# Patient Record
Sex: Female | Born: 1945 | Race: White | Hispanic: No | Marital: Married | Smoking: Never smoker
Health system: Southern US, Community
[De-identification: ages and names within clinical notes are randomized; demographics above are authoritative.]

## PROBLEM LIST (undated history)

## (undated) DIAGNOSIS — R531 Weakness: Secondary | ICD-10-CM

## (undated) DIAGNOSIS — F32A Depression, unspecified: Secondary | ICD-10-CM

## (undated) DIAGNOSIS — F329 Major depressive disorder, single episode, unspecified: Secondary | ICD-10-CM

## (undated) DIAGNOSIS — Z9842 Cataract extraction status, left eye: Secondary | ICD-10-CM

## (undated) DIAGNOSIS — M51369 Other intervertebral disc degeneration, lumbar region without mention of lumbar back pain or lower extremity pain: Secondary | ICD-10-CM

## (undated) DIAGNOSIS — G4700 Insomnia, unspecified: Secondary | ICD-10-CM

## (undated) DIAGNOSIS — Z950 Presence of cardiac pacemaker: Secondary | ICD-10-CM

## (undated) DIAGNOSIS — E119 Type 2 diabetes mellitus without complications: Secondary | ICD-10-CM

## (undated) DIAGNOSIS — N183 Chronic kidney disease, stage 3 unspecified: Secondary | ICD-10-CM

## (undated) DIAGNOSIS — I890 Lymphedema, not elsewhere classified: Secondary | ICD-10-CM

## (undated) DIAGNOSIS — I472 Ventricular tachycardia: Secondary | ICD-10-CM

## (undated) DIAGNOSIS — Z860100 Personal history of colon polyps, unspecified: Secondary | ICD-10-CM

## (undated) DIAGNOSIS — G2581 Restless legs syndrome: Secondary | ICD-10-CM

## (undated) DIAGNOSIS — I739 Peripheral vascular disease, unspecified: Secondary | ICD-10-CM

## (undated) DIAGNOSIS — E039 Hypothyroidism, unspecified: Secondary | ICD-10-CM

## (undated) DIAGNOSIS — I209 Angina pectoris, unspecified: Secondary | ICD-10-CM

## (undated) DIAGNOSIS — R609 Edema, unspecified: Secondary | ICD-10-CM

## (undated) DIAGNOSIS — Z87898 Personal history of other specified conditions: Secondary | ICD-10-CM

## (undated) DIAGNOSIS — G4733 Obstructive sleep apnea (adult) (pediatric): Secondary | ICD-10-CM

## (undated) DIAGNOSIS — R32 Unspecified urinary incontinence: Secondary | ICD-10-CM

## (undated) DIAGNOSIS — I471 Supraventricular tachycardia: Secondary | ICD-10-CM

## (undated) DIAGNOSIS — M1990 Unspecified osteoarthritis, unspecified site: Secondary | ICD-10-CM

## (undated) DIAGNOSIS — K649 Unspecified hemorrhoids: Secondary | ICD-10-CM

## (undated) DIAGNOSIS — E041 Nontoxic single thyroid nodule: Secondary | ICD-10-CM

## (undated) DIAGNOSIS — K219 Gastro-esophageal reflux disease without esophagitis: Secondary | ICD-10-CM

## (undated) DIAGNOSIS — K5790 Diverticulosis of intestine, part unspecified, without perforation or abscess without bleeding: Secondary | ICD-10-CM

## (undated) DIAGNOSIS — I10 Essential (primary) hypertension: Secondary | ICD-10-CM

## (undated) DIAGNOSIS — M2540 Effusion, unspecified joint: Secondary | ICD-10-CM

## (undated) DIAGNOSIS — R55 Syncope and collapse: Secondary | ICD-10-CM

## (undated) DIAGNOSIS — D649 Anemia, unspecified: Secondary | ICD-10-CM

## (undated) DIAGNOSIS — Z8719 Personal history of other diseases of the digestive system: Secondary | ICD-10-CM

## (undated) DIAGNOSIS — R001 Bradycardia, unspecified: Secondary | ICD-10-CM

## (undated) DIAGNOSIS — I4719 Other supraventricular tachycardia: Secondary | ICD-10-CM

## (undated) DIAGNOSIS — L719 Rosacea, unspecified: Secondary | ICD-10-CM

## (undated) DIAGNOSIS — Z7982 Long term (current) use of aspirin: Secondary | ICD-10-CM

## (undated) DIAGNOSIS — F419 Anxiety disorder, unspecified: Secondary | ICD-10-CM

## (undated) DIAGNOSIS — N189 Chronic kidney disease, unspecified: Secondary | ICD-10-CM

## (undated) DIAGNOSIS — Z8739 Personal history of other diseases of the musculoskeletal system and connective tissue: Secondary | ICD-10-CM

## (undated) DIAGNOSIS — I5189 Other ill-defined heart diseases: Secondary | ICD-10-CM

## (undated) DIAGNOSIS — J986 Disorders of diaphragm: Secondary | ICD-10-CM

## (undated) DIAGNOSIS — M62838 Other muscle spasm: Secondary | ICD-10-CM

## (undated) DIAGNOSIS — E785 Hyperlipidemia, unspecified: Secondary | ICD-10-CM

## (undated) DIAGNOSIS — I5032 Chronic diastolic (congestive) heart failure: Secondary | ICD-10-CM

## (undated) DIAGNOSIS — L309 Dermatitis, unspecified: Secondary | ICD-10-CM

## (undated) DIAGNOSIS — I700 Atherosclerosis of aorta: Secondary | ICD-10-CM

## (undated) DIAGNOSIS — M2550 Pain in unspecified joint: Secondary | ICD-10-CM

## (undated) DIAGNOSIS — I498 Other specified cardiac arrhythmias: Secondary | ICD-10-CM

## (undated) DIAGNOSIS — Z9841 Cataract extraction status, right eye: Secondary | ICD-10-CM

## (undated) DIAGNOSIS — I2510 Atherosclerotic heart disease of native coronary artery without angina pectoris: Secondary | ICD-10-CM

## (undated) DIAGNOSIS — M549 Dorsalgia, unspecified: Secondary | ICD-10-CM

## (undated) DIAGNOSIS — G4730 Sleep apnea, unspecified: Secondary | ICD-10-CM

## (undated) DIAGNOSIS — G8929 Other chronic pain: Secondary | ICD-10-CM

## (undated) DIAGNOSIS — R51 Headache: Secondary | ICD-10-CM

## (undated) DIAGNOSIS — I5030 Unspecified diastolic (congestive) heart failure: Secondary | ICD-10-CM

## (undated) DIAGNOSIS — I8390 Asymptomatic varicose veins of unspecified lower extremity: Secondary | ICD-10-CM

## (undated) DIAGNOSIS — Z789 Other specified health status: Secondary | ICD-10-CM

## (undated) DIAGNOSIS — K5900 Constipation, unspecified: Secondary | ICD-10-CM

## (undated) DIAGNOSIS — Z9989 Dependence on other enabling machines and devices: Secondary | ICD-10-CM

## (undated) DIAGNOSIS — A498 Other bacterial infections of unspecified site: Secondary | ICD-10-CM

## (undated) DIAGNOSIS — R3915 Urgency of urination: Secondary | ICD-10-CM

## (undated) DIAGNOSIS — I4729 Other ventricular tachycardia: Secondary | ICD-10-CM

## (undated) DIAGNOSIS — I509 Heart failure, unspecified: Secondary | ICD-10-CM

## (undated) HISTORY — PX: TRIGGER FINGER RELEASE: SHX641

## (undated) HISTORY — PX: DILATION AND CURETTAGE OF UTERUS: SHX78

## (undated) HISTORY — PX: BACK SURGERY: SHX140

## (undated) HISTORY — DX: Sleep apnea, unspecified: G47.30

## (undated) HISTORY — DX: Essential (primary) hypertension: I10

## (undated) HISTORY — DX: Dependence on other enabling machines and devices: Z99.89

## (undated) HISTORY — DX: Depression, unspecified: F32.A

## (undated) HISTORY — PX: CATARACT EXTRACTION W/ INTRAOCULAR LENS IMPLANT: SHX1309

## (undated) HISTORY — PX: PICC LINE PLACE PERIPHERAL (ARMC HX): HXRAD1248

## (undated) HISTORY — DX: Major depressive disorder, single episode, unspecified: F32.9

## (undated) HISTORY — DX: Other ventricular tachycardia: I47.29

## (undated) HISTORY — PX: CARPAL TUNNEL RELEASE: SHX101

## (undated) HISTORY — PX: TONSILLECTOMY: SUR1361

## (undated) HISTORY — PX: ROTATOR CUFF REPAIR: SHX139

## (undated) HISTORY — PX: CHOLECYSTECTOMY: SHX55

## (undated) HISTORY — PX: IMPLANTABLE CONTACT LENS IMPLANTATION: SHX1792

## (undated) HISTORY — DX: Dermatitis, unspecified: L30.9

## (undated) HISTORY — DX: Ventricular tachycardia: I47.2

## (undated) HISTORY — DX: Heart failure, unspecified: I50.9

## (undated) HISTORY — PX: COLONOSCOPY: SHX174

## (undated) HISTORY — PX: DIAGNOSTIC LAPAROSCOPY: SUR761

## (undated) HISTORY — PX: TOTAL ABDOMINAL HYSTERECTOMY W/ BILATERAL SALPINGOOPHORECTOMY: SHX83

## (undated) HISTORY — DX: Hyperlipidemia, unspecified: E78.5

## (undated) HISTORY — DX: Obstructive sleep apnea (adult) (pediatric): G47.33

## (undated) HISTORY — DX: Gastro-esophageal reflux disease without esophagitis: K21.9

## (undated) HISTORY — PX: ABDOMINAL HYSTERECTOMY: SHX81

## (undated) HISTORY — PX: CATARACT EXTRACTION, BILATERAL: SHX1313

---

## 2003-11-13 ENCOUNTER — Encounter: Payer: Self-pay | Admitting: Internal Medicine

## 2004-01-18 ENCOUNTER — Ambulatory Visit: Payer: Self-pay | Admitting: General Surgery

## 2004-01-27 ENCOUNTER — Ambulatory Visit: Payer: Self-pay | Admitting: General Surgery

## 2004-02-23 ENCOUNTER — Ambulatory Visit: Payer: Self-pay | Admitting: Internal Medicine

## 2004-07-27 ENCOUNTER — Ambulatory Visit: Payer: Self-pay | Admitting: Internal Medicine

## 2004-11-29 ENCOUNTER — Ambulatory Visit: Payer: Self-pay | Admitting: Internal Medicine

## 2005-01-25 ENCOUNTER — Ambulatory Visit: Payer: Self-pay | Admitting: Orthopaedic Surgery

## 2005-02-15 ENCOUNTER — Observation Stay: Payer: Self-pay | Admitting: Orthopaedic Surgery

## 2005-02-19 ENCOUNTER — Inpatient Hospital Stay: Payer: Self-pay | Admitting: Internal Medicine

## 2005-02-19 ENCOUNTER — Other Ambulatory Visit: Payer: Self-pay

## 2005-03-15 ENCOUNTER — Ambulatory Visit: Payer: Self-pay | Admitting: Internal Medicine

## 2005-06-29 ENCOUNTER — Ambulatory Visit: Payer: Self-pay | Admitting: Internal Medicine

## 2005-08-07 ENCOUNTER — Ambulatory Visit: Payer: Self-pay

## 2006-01-28 ENCOUNTER — Ambulatory Visit: Payer: Self-pay | Admitting: General Surgery

## 2006-02-01 ENCOUNTER — Ambulatory Visit: Payer: Self-pay | Admitting: General Surgery

## 2006-08-09 ENCOUNTER — Ambulatory Visit: Payer: Self-pay | Admitting: Internal Medicine

## 2007-05-20 ENCOUNTER — Ambulatory Visit: Payer: Self-pay | Admitting: Internal Medicine

## 2007-06-02 ENCOUNTER — Ambulatory Visit: Payer: Self-pay | Admitting: Internal Medicine

## 2007-06-03 ENCOUNTER — Ambulatory Visit: Payer: Self-pay | Admitting: Internal Medicine

## 2007-06-03 ENCOUNTER — Encounter: Payer: Self-pay | Admitting: Internal Medicine

## 2007-06-05 ENCOUNTER — Ambulatory Visit: Payer: Self-pay | Admitting: Internal Medicine

## 2007-06-11 ENCOUNTER — Ambulatory Visit: Payer: Self-pay | Admitting: Internal Medicine

## 2007-06-11 HISTORY — PX: LEFT HEART CATH AND CORONARY ANGIOGRAPHY: CATH118249

## 2007-06-17 ENCOUNTER — Ambulatory Visit: Payer: Self-pay | Admitting: Internal Medicine

## 2007-07-29 ENCOUNTER — Ambulatory Visit: Payer: Self-pay | Admitting: Internal Medicine

## 2007-10-09 ENCOUNTER — Ambulatory Visit: Payer: Self-pay | Admitting: Rheumatology

## 2008-02-10 ENCOUNTER — Ambulatory Visit: Payer: Self-pay | Admitting: Internal Medicine

## 2008-02-18 ENCOUNTER — Ambulatory Visit: Payer: Self-pay | Admitting: Internal Medicine

## 2008-02-18 ENCOUNTER — Observation Stay: Payer: Self-pay | Admitting: Internal Medicine

## 2008-06-08 ENCOUNTER — Encounter: Payer: Self-pay | Admitting: Internal Medicine

## 2008-06-08 ENCOUNTER — Ambulatory Visit: Payer: Self-pay | Admitting: Internal Medicine

## 2008-06-08 DIAGNOSIS — R0609 Other forms of dyspnea: Secondary | ICD-10-CM | POA: Insufficient documentation

## 2008-06-08 DIAGNOSIS — E781 Pure hyperglyceridemia: Secondary | ICD-10-CM | POA: Insufficient documentation

## 2008-06-08 DIAGNOSIS — R0600 Dyspnea, unspecified: Secondary | ICD-10-CM | POA: Insufficient documentation

## 2008-06-10 ENCOUNTER — Ambulatory Visit: Payer: Self-pay | Admitting: Cardiology

## 2008-06-10 ENCOUNTER — Encounter: Payer: Self-pay | Admitting: Internal Medicine

## 2008-06-14 LAB — CONVERTED CEMR LAB
Albumin: 4.2 g/dL (ref 3.5–5.2)
Alkaline Phosphatase: 85 units/L (ref 39–117)
BUN: 22 mg/dL (ref 6–23)
Cholesterol: 156 mg/dL (ref 0–200)
Glucose, Bld: 118 mg/dL — ABNORMAL HIGH (ref 70–99)
HDL: 41 mg/dL (ref >39)
LDL Cholesterol: 63 mg/dL (ref 0–99)
Potassium: 4.4 meq/L (ref 3.5–5.3)
Triglycerides: 258 mg/dL — ABNORMAL HIGH (ref <150)

## 2008-08-02 ENCOUNTER — Ambulatory Visit: Payer: Self-pay | Admitting: Internal Medicine

## 2008-11-03 ENCOUNTER — Ambulatory Visit: Payer: Self-pay | Admitting: Orthopedic Surgery

## 2008-11-11 ENCOUNTER — Ambulatory Visit: Payer: Self-pay | Admitting: Orthopedic Surgery

## 2009-08-03 ENCOUNTER — Ambulatory Visit: Payer: Self-pay | Admitting: Internal Medicine

## 2009-10-14 ENCOUNTER — Ambulatory Visit: Payer: Self-pay | Admitting: Internal Medicine

## 2010-02-12 DIAGNOSIS — E039 Hypothyroidism, unspecified: Secondary | ICD-10-CM | POA: Insufficient documentation

## 2010-05-05 ENCOUNTER — Ambulatory Visit (INDEPENDENT_AMBULATORY_CARE_PROVIDER_SITE_OTHER): Payer: No Typology Code available for payment source | Admitting: Cardiovascular Disease

## 2010-05-05 ENCOUNTER — Encounter: Payer: Self-pay | Admitting: Cardiovascular Disease

## 2010-05-05 DIAGNOSIS — R609 Edema, unspecified: Secondary | ICD-10-CM

## 2010-05-05 DIAGNOSIS — E781 Pure hyperglyceridemia: Secondary | ICD-10-CM

## 2010-05-05 DIAGNOSIS — R000 Tachycardia, unspecified: Secondary | ICD-10-CM

## 2010-05-05 DIAGNOSIS — E119 Type 2 diabetes mellitus without complications: Secondary | ICD-10-CM

## 2010-05-05 DIAGNOSIS — G4733 Obstructive sleep apnea (adult) (pediatric): Secondary | ICD-10-CM | POA: Insufficient documentation

## 2010-05-05 DIAGNOSIS — R0602 Shortness of breath: Secondary | ICD-10-CM

## 2010-05-05 DIAGNOSIS — I10 Essential (primary) hypertension: Secondary | ICD-10-CM

## 2010-05-05 DIAGNOSIS — Z9989 Dependence on other enabling machines and devices: Secondary | ICD-10-CM

## 2010-05-05 MED ORDER — METOPROLOL TARTRATE 25 MG PO TABS: 25.0000 mg | ORAL_TABLET | Freq: Two times a day (BID) | ORAL | Status: DC

## 2010-05-05 MED ORDER — ROSUVASTATIN CALCIUM 5 MG PO TABS: 5.0000 mg | ORAL_TABLET | Freq: Every day | ORAL | Status: DC

## 2010-05-05 NOTE — Assessment & Plan Note (Signed)
Her weight is likely the main cause of her sleep apnea. She does wear CPAP on a regular basis.

## 2010-05-05 NOTE — Assessment & Plan Note (Signed)
For her elevated triglycerides, we will start fish oil 2 tabs a day titrating upwards as tolerated. She is also very concerned about her cholesterol and we will try Crestor 5 mg daily. She did take Pravachol 40 mg previously though did have muscle ache.

## 2010-05-05 NOTE — Assessment & Plan Note (Signed)
Shortness of breath and atypical type chest pain is likely secondary from her underlying tachycardia, also as well as her deconditioning

## 2010-05-05 NOTE — Assessment & Plan Note (Signed)
She does have tachycardia on today's visit which she reports is chronic. 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 We have started metoprolol tartrate 25 mg b.i.d. We will titrate the beta blocker upwards as tolerated for rate control.

## 2010-05-05 NOTE — Assessment & Plan Note (Signed)
I have encouraged her to continue to watch her diet, try to lose weight, increase her exercise.

## 2010-05-05 NOTE — Assessment & Plan Note (Signed)
Blood pressure is elevated today. We'll add metoprolol tartrate 25 mg b.i.d. This will also assist with her tachycardia.

## 2010-05-05 NOTE — Progress Notes (Signed)
   Patient ID: [name], female    DOB: Jun 17, 1945, 65 y.o.   MRN: [id number]  HPI Comments:  65 year old woman with a strong family history of  heart disease, Obesity, diabetes, obstructive sleep apnea on CPAP, chronic tachycardia, hyperlipidemia with very elevated triglycerides, chronic edema who presents for routine followup.  She reports that she has had fatigue, palpitations, elevated blood pressure. She is concerned about her high cholesterol and triglycerides given her strong family history. She also reports a baseline elevated heart rate. She has not been exercising. She has been trying to watch her diet though her weight continues to be a problem. She is uncertain why she has fatigue and occasional chest discomfort. The chest discomfort is rare, sometimes comes on at rest, sometimes with exertion it does not last very long. The diuretic pill has been helping her edema but she continues to have swelling. She does not take lisinopril as she reports it drains her and makes her feel dizzy. If she does take it, she takes a half pill.   Her identical sister has a bicuspid aortic vavle and CAD.  However, she has a normal aortic valve and minimal nonobstuctive CAD by cath 5/09.    Was admitted in January 2010 with dyspnea and presyncope. CT and echo normal.   Labs from August 2011 showed total cholesterol 190, triglycerides 540, HDL 34, hemoglobin A1c 6.0, vitamin D is very low at less than 30     Review of Systems  Constitutional: Positive for fatigue. Negative for chills, diaphoresis, appetite change and unexpected weight change.  HENT: Negative.   Eyes: Negative.   Respiratory: Positive for apnea, chest tightness and shortness of breath. Negative for cough and choking.   Cardiovascular: Positive for chest pain, palpitations and leg swelling.  Gastrointestinal: Negative.   Musculoskeletal: Negative.   Skin: Negative.   Neurological: Negative.   Hematological: Negative.     Psychiatric/Behavioral: Negative.    BP 160/78  Pulse 103  Ht 5\' 3"  (1.6 m)  Wt 211 lb (95.709 kg)  BMI 37.38 kg/m2   Physical Exam  Constitutional: She is oriented to person, place, and time. She appears well-developed and well-nourished.  HENT:  Head: Normocephalic.  Nose: Nose normal.  Mouth/Throat: Oropharynx is clear and moist.  Eyes: Conjunctivae are normal. Pupils are equal, round, and reactive to light.  Neck: Normal range of motion. Neck supple. No JVD present.  Cardiovascular: Regular rhythm, S1 normal, S2 normal and intact distal pulses.   Occasional extrasystoles are present. Tachycardia present.  Exam reveals no gallop and no friction rub.   Murmur heard.  Crescendo systolic murmur is present with a grade of 2/6       RSB  Pulmonary/Chest: Effort normal and breath sounds normal. No respiratory distress. She has no wheezes. She has no rales. She exhibits no tenderness.  Abdominal: Soft. Bowel sounds are normal. She exhibits no distension. There is no tenderness.       Obese  Musculoskeletal: Normal range of motion. She exhibits edema. She exhibits no tenderness.  Lymphadenopathy:    She has no cervical adenopathy.  Neurological: She is alert and oriented to person, place, and time. Coordination normal.  Skin: Skin is warm and dry. No rash noted. No erythema.  Psychiatric: She has a normal mood and affect. Her behavior is normal. Judgment and thought content normal.      Assessment and Plan

## 2010-05-05 NOTE — Patient Instructions (Signed)
Start metoprolol 1/2 tab in AM and PM. Monitor heart rate for 1 to 2 weeks. Take a full pill if the heart rate continues to be more than 90 on a regular basis.  Start crestor 5 mg daily. Check cholesterol when you meed with Dr. Hyacinth Meeker in June.  Start fish oil 2 tabs daily, increase up to 4 a day as tolerated.  Follow up in the cardiology clinic in 4 months or earlier if you continue to have tachycardia and high blood pressure.

## 2010-05-05 NOTE — Assessment & Plan Note (Signed)
She has mild edema today. I suggested she could take an extra torsemide pill as needed. Edema is likely secondary to her weight, underlying obstructive sleep apnea, possible diastolic dysfunction.

## 2010-05-25 ENCOUNTER — Encounter: Payer: Self-pay | Admitting: Cardiovascular Disease

## 2010-05-26 ENCOUNTER — Encounter: Payer: Self-pay | Admitting: Cardiovascular Disease

## 2010-06-15 ENCOUNTER — Ambulatory Visit: Payer: Self-pay | Admitting: General Surgery

## 2010-06-23 ENCOUNTER — Ambulatory Visit: Payer: Self-pay | Admitting: General Surgery

## 2010-06-27 NOTE — Assessment & Plan Note (Signed)
[facility] OFFICE NOTE   NAME:POWELLPhoenix, [name]                         MRN:          [id number]  DATE:05/20/2007                            DOB:          1945-04-09    REFERRING PHYSICIAN:  Bethann Punches   REASON FOR CONSULTATION:  Chest pain and palpitations.   HISTORY OF PRESENT ILLNESS:  [name] is a very pleasant, 65 year old  woman who is an identical twin sister of [name].  We follow her  sister in our clinic for history of bicuspid aortic valve and  significant coronary artery disease.  She is status post valve  replacement and bypass surgery.   [name] has a history of chest pain dating back to 2005.  At that  time, she underwent cardiac catheterization by Dr. Gwen Pounds which showed  just minimal nonobstructive coronary artery disease.  She also had a  normal echocardiogram.  Since that time, she has had occasional chest  pain about once a month.  Here until recently, about a month ago, she  just started to develop much more frequent chest pain.  She describes  this as a burning.  It is much different than her previous chest pain  and now happens almost on a daily basis.  It can come at rest or with  exertion and usually lasts just less than a minute and then resolves.  There are no associated symptoms.  She has also been noticing that she  has had periods of palpitations and at times when her blood pressure is  up, she says her heart rate gets up to 100-110, she has PVCs and feels  uncomfortable.  She has not had syncope or any presyncope.   REVIEW OF SYSTEMS:  She denies any heart failure symptoms.  She does  have a history of borderline diabetes as well as gastroesophageal reflux  disease and fatigued.  The remainder of the review of systems is  negative for HPI and problem list.   PROBLEM LIST:  1. Chest pain.  Cardiac catheterization in 2005, showed minimal      coronary artery  disease.  2. Hypertension.  3. Obesity.  4. Multinodular goiter.  5. Metabolic syndrome.  6. A very strong family history of coronary artery disease.  7. History of gallstones, status post cholecystectomy.  8. Status post hysterectomy.   CURRENT MEDICATIONS:  1. Premarin 0.625 mg a day.  2. Pravachol 40 a day.  3. Zetia 10 a day.  4. Aspirin 81 a day.  5. Nexium 40 a day.  6. Diclofenac 75 a day.  7. Lisinopril/HCTZ 20/12.5 1 tablet daily.  8. Requip.   ALLERGIES:  PENICILLIN AND CODEINE.   SOCIAL HISTORY:  She is married with two kids.  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  She does not smoke or  drink alcohol.   FAMILY HISTORY:  Mother died at 80 due to congestive heart failure.  Father died at 45 due to heart disease and renal failure.  She has an  identical twin sister who  has aortic stenosis secondary to bicuspid  aortic valve and severe coronary disease.  She had a brother, age 22,  who also has coronary artery disease.   PHYSICAL EXAMINATION:  GENERAL:  She is in no acute distress.  She  ambulates around the clinic without any respiratory difficulty.  VITAL SIGNS:  Blood pressure is 154/84, heart rate 89, weight 197.  HEENT:  Normal.  Neck is supple and thick.  No obvious JVD.  Carotids  are 2+ bilaterally with no bruits.  There is no lymphadenopathy.  She  does have significant thyromegaly.  CARDIAC:  PMI is nondisplaced.  Regular rate and rhythm.  No murmurs,  rubs or gallops.  LUNGS:  Clear.  ABDOMEN:  Obese, nontender, nondistended.  No obvious  hepatosplenomegaly.  No bruits, no masses.  Good bowel sounds.  EXTREMITIES:  Warm with no cyanosis, clubbing or edema.  No rash.  NEUROLOGIC:  Alert and oriented x3.  Cranial nerves II-XII are intact.  Moves all four extremities without difficulty.  Affect is very pleasant.   EKG shows normal sinus rhythm at a rate of 89.  There is normal axis and  intervals.   ASSESSMENT/PLAN:  1. Chest  pain.  This has both typical and atypical features.  We have      discussed the options of stress testing versus catheterization.      Given her family history, she feels that she would not be reassured      with a negative stress test and thus, we have decided to proceed      with cardiac catheterization.  She would like to schedule this for      after she gets back from the beach.  2. Palpitations.  They sound to be like they may have an anxiety      component, but she denies this.  She has been under a lot of stress      recently as her mother died in 12-May-2022.  We will go ahead and put a 2-      week event monitor on her to see if we can detect any arrhythmias.  3. Hypertension.  Blood pressure is suboptimally controlled.  Start      Coreg 6.25 b.i.d.  This may also help with her palpitations.  4. Hyperlipidemia.  This is followed by Dr. Hyacinth Meeker.   DISPOSITION:  Pending the results of her catheterization and monitor.     Bevelyn Buckles. Bensimhon, MD  Electronically Signed    DRB/MedQ  DD: 05/20/2007  DT: 05/20/2007  Job #: [id number]   cc:   Bethann Punches

## 2010-06-27 NOTE — Assessment & Plan Note (Signed)
[facility] OFFICE NOTE   NAME:POWELLOmolara, Carol                         MRN:          [id number]  DATE:06/17/2007                            DOB:          [birth year]    PRIMARY CARE PHYSICIAN:  Dr. Bethann Punches.   INTERVAL HISTORY:  Ms. Street is a delightful 65 year old woman with a  strong family history of coronary artery disease and bicuspid aortic  valve in her identical twin sister.  However, her aortic valve has been  normal.  She does have a history of hypertension, hyperlipidemia,  metabolic syndrome, and obesity.  She recently underwent catheterization  last week for intermittent chest pain.  This showed just minimal  nonobstructive coronary disease with 30% RCA lesion and 30% lesion in  first diagonal.  LV function was normal.   She returns for post cath follow-up.  She continues to have mild chest  twinges without change.  Otherwise, she has done well.  She has not had  any problems with her catheterization site.   CURRENT MEDICATIONS:  1. Premarin 0.625 mg a day.  2. Pravachol 40 a day.  3. Zetia 10 a day.  4. Aspirin 81 a day.  5. Nexium 40 a day.  6. Diclofenac 75 a day.  7. Lisinopril 20/12.5 1/2 tablet a day.  8. Requip one a day.  9. Carvedilol 6.25 b.i.d.   PHYSICAL EXAMINATION:  GENERAL:  She is in no acute distress.  Ambulates  around the clinic without any respiratory difficulty.  VITAL SIGNS:  Blood pressure 126/78, heart rate 72, weight 200.  HEENT:  Normal.  NECK:  Supple.  There is no JVD, carotids 2+ bilaterally without any  bruits.  There is no lymphadenopathy or thyromegaly.  CARDIAC:  PMI is nondisplaced.  She has a regular rate and rhythm with  S4, no murmurs.  LUNGS:  Clear.  ABDOMEN:  Obese, nontender, nondistended.  Good bowel sounds.  EXTREMITIES:  Warm with no cyanosis, clubbing or edema.  Good pulses.  Groin site looks good.  There is no bruit or hematoma.  NEUROLOGICAL:  Alert and oriented x3.  Cranial nerves II-XII are intact.  Moves all four extremities without  difficulty.   ASSESSMENT/PLAN:  1. Minimal amount of obstructive coronary artery disease.  She does      have some evidence of plaquing in her coronaries which was really      not evident in 2005.  Thus I think will need to have aggressive      risk factor modification.  I have told her we need to get tight      control of her blood pressure, get her LDL under 70, and make sure      she loses weight with an exercise program.  She will follow up with      this with Dr. Hyacinth Meeker.  I have given her goal of losing 1/2 pound a      week over the next three months.  2. Blood pressure is fairly well-controlled.  We did tell her just to  go up to increase her lisinopril to a full tablet.   DISPOSITION:  Will see her back in 3-4 months for routine follow-up.     Bevelyn Buckles. Bensimhon, MD  Electronically Signed    DRB/MedQ  DD: 06/17/2007  DT: 06/17/2007  Job #: [id number]   cc:   Bethann Punches

## 2010-07-04 ENCOUNTER — Ambulatory Visit: Payer: Self-pay | Admitting: Internal Medicine

## 2010-08-17 ENCOUNTER — Ambulatory Visit: Payer: Self-pay | Admitting: Internal Medicine

## 2010-08-23 ENCOUNTER — Ambulatory Visit: Payer: Self-pay | Admitting: Internal Medicine

## 2010-11-13 ENCOUNTER — Telehealth: Payer: Self-pay | Admitting: Cardiovascular Disease

## 2010-11-13 ENCOUNTER — Inpatient Hospital Stay: Payer: Self-pay | Admitting: Internal Medicine

## 2010-11-13 DIAGNOSIS — R079 Chest pain, unspecified: Secondary | ICD-10-CM

## 2010-11-13 NOTE — Telephone Encounter (Signed)
C/o chest pressure mid-sternal to left side with left arm pain that has incr gradually over past 2 weeks. Left arm pain is her most significant complaint. "Usually relieved by 1 NTG SL." Pain has mostly felt like pressure, however, Friday night "felt grabbing, sharp pain that brought tears to my eyes." This occurred twice and resolved on its own. One night in past 2 weeks pt woke up with SOB.  Symptoms occur at rest to minimal activity. BP has been consistently elevated at 159/96, HR >100 usually sometimes 115-120.  Yesterday one episode of low BP 94/50, diaphoresis after just working in [employer]. This was not after taking BP med. (Pt does have h/o tachycardia and when seen last by DB atypical CP thought to be due to tachy.) Pt does have h/o CAD, cath <5 yrs ago at [facility], no stent. She assures me that her CP is nothing like when seen previously for tachycardia, "this is different."   Pt is asymptomatic at this time, only c/o is continued elevated BP. I advised she be seen today, nowhere on our schedule, I recc she either go to urgent care or (ER if symptoms occur again). Pt wants to see PCP Bethann Punches, they can do EKG. Told pt ok but to call back with update, and advised if at any time symptoms return or feels unstable to call 911 or have someone take her to ER. Pt agrees. Also, pt needs f/u with TG in the near future, pt will call back to schedule.

## 2010-11-13 NOTE — Telephone Encounter (Signed)
Pt called c/o chest discomfort over the weekend. BP up. Some nausea and BP drop down, sweating yesterday. Arm pain took a nitro with some relief.

## 2010-11-17 ENCOUNTER — Encounter: Payer: Self-pay | Admitting: Internal Medicine

## 2010-11-29 ENCOUNTER — Encounter: Payer: Self-pay | Admitting: Cardiovascular Disease

## 2010-12-01 ENCOUNTER — Encounter: Payer: Self-pay | Admitting: Cardiovascular Disease

## 2010-12-01 ENCOUNTER — Ambulatory Visit (INDEPENDENT_AMBULATORY_CARE_PROVIDER_SITE_OTHER): Payer: No Typology Code available for payment source | Admitting: Cardiovascular Disease

## 2010-12-01 DIAGNOSIS — E781 Pure hyperglyceridemia: Secondary | ICD-10-CM

## 2010-12-01 DIAGNOSIS — R000 Tachycardia, unspecified: Secondary | ICD-10-CM

## 2010-12-01 DIAGNOSIS — I10 Essential (primary) hypertension: Secondary | ICD-10-CM

## 2010-12-01 DIAGNOSIS — R609 Edema, unspecified: Secondary | ICD-10-CM

## 2010-12-01 DIAGNOSIS — E119 Type 2 diabetes mellitus without complications: Secondary | ICD-10-CM

## 2010-12-01 DIAGNOSIS — R0602 Shortness of breath: Secondary | ICD-10-CM

## 2010-12-01 MED ORDER — OMEGA-3-ACID ETHYL ESTERS 1 G PO CAPS: 2.0000 g | ORAL_CAPSULE | Freq: Two times a day (BID) | ORAL | Status: DC

## 2010-12-01 NOTE — Assessment & Plan Note (Signed)
We'll start bystolic for tachycardia. Her heart rate today is adequate though she does report faster heart rate in the late afternoon as the morning metoprolol is wearing off.

## 2010-12-01 NOTE — Assessment & Plan Note (Signed)
She is currently taking torsemide and has no significant edema. We've asked her to be careful and take this as needed to avoid dehydration

## 2010-12-01 NOTE — Progress Notes (Signed)
Patient ID: [name], female    DOB: [birth year], 65 y.o.   MRN: [id number]  HPI Comments:  65 year old woman with a strong family history of  heart disease, Obesity, diabetes, obstructive sleep apnea on CPAP, chronic tachycardia, hyperlipidemia with very elevated triglycerides, chronic edema who presents for routine followup.  She was recently seen in the hospital for severe hypertension and chest pain. Admission on October 1, discharge on November 14 2010. She ruled out by cardiac enzymes, chest CT was negative for PE. She had a stress test that showed no ischemia. Her medications were adjusted. 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 She did have a cortisone shot by Dr. Bethann Punches with some improvement of her symptoms.  She does not tolerate statins and is currently not taking any medication for cholesterol. She is off lisinopril as per her report, her blood pressure was too low so she does report it goes upwards in the afternoon. Metoprolol does seem to help her heart rate and she would like a pill that does both blood pressure and heart rate. She feels that she has to take the metoprolol in the p.m. Early as it is wearing off.   Recent lab work shows her hemoglobin A1c has gone up from many low 6 range up to 7.3. Cholesterol 225, triglycerides 630, HDL 36 She reports that these labs were not fasting  normal aortic valve and minimal nonobstuctive CAD by cath 5/09.   Was admitted in January 2010 with dyspnea and presyncope. CT and echo normal.      Outpatient Encounter Prescriptions as of 12/01/2010  Medication Sig Dispense Refill  . aspirin 81 MG tablet Take 81 mg by mouth daily.        [name] esomeprazole (NEXIUM) 40 MG capsule Take 40 mg by mouth daily before breakfast.        . estrogens, conjugated, (PREMARIN) 0.625 MG tablet Take  0.625 mg by mouth daily. Take daily for 21 days then do not take for 7 days.       . fish oil-omega-3 fatty acids 1000 MG capsule Take 2 g by mouth daily.        [name] gabapentin (NEURONTIN) 300 MG capsule Take 300 mg by mouth 3 (three) times daily.        [name] glipiZIDE (GLUCOTROL) 5 MG 24 hr tablet Take 5 mg by mouth daily.        [name] omega-3 acid ethyl esters (LOVAZA) 1 G capsule Take 2 g by mouth 2 (two) times daily.        . Potassium 95 MG TBCR Take 95 mg by mouth 1 dose over 46 hours.        . torsemide (DEMADEX) 20 MG tablet Take 20 mg by mouth daily.        .  metoprolol tartrate (LOPRESSOR) 25 MG tablet Take 1 tablet (25 mg total) by mouth 2 (two) times daily.  60 tablet  11     Review of Systems  HENT: Negative.   Eyes: Negative.   Respiratory: Negative.   Cardiovascular: Negative.   Gastrointestinal: Negative.   Musculoskeletal: Negative.   Skin: Negative.   Neurological: Negative.   Hematological: Negative.   Psychiatric/Behavioral: Negative.   All other systems reviewed and are negative.   BP 140/76  Pulse 80  Ht 5\' 3"  (  1.6 m)  Wt 209 lb (94.802 kg)  BMI 37.02 kg/m2   Physical Exam  Constitutional: She is oriented to person, place, and time. She appears well-developed and well-nourished.  HENT:  Head: Normocephalic.  Nose: Nose normal.  Mouth/Throat: Oropharynx is clear and moist.  Eyes: Conjunctivae are normal. Pupils are equal, round, and reactive to light.  Neck: Normal range of motion. Neck supple. No JVD present.  Cardiovascular: Regular rhythm, S1 normal, S2 normal and intact distal pulses.   No extrasystoles are present. Exam reveals no gallop and no friction rub.   Murmur heard.  Crescendo systolic murmur is present with a grade of 2/6  Pulmonary/Chest: Effort normal and breath sounds normal. No respiratory distress. She has no wheezes. She has no rales. She exhibits no tenderness.  Abdominal: Soft. Bowel sounds are normal. She exhibits no distension. There is  no tenderness.       Obese  Musculoskeletal: Normal range of motion. She exhibits no edema and no tenderness.  Lymphadenopathy:    She has no cervical adenopathy.  Neurological: She is alert and oriented to person, place, and time. Coordination normal.  Skin: Skin is warm and dry. No rash noted. No erythema.  Psychiatric: She has a normal mood and affect. Her behavior is normal. Judgment and thought content normal.      Assessment and Plan

## 2010-12-01 NOTE — Assessment & Plan Note (Signed)
She has difficulty taking over-the-counter fish oil. We will prescribe lovaza b.i.d. For  triglycerides

## 2010-12-01 NOTE — Patient Instructions (Addendum)
Try red yeast rice for cholesterol Try flax oil, or omega red for triglycerides  (try lovaza twice a day)  For heart rate and blood pressure, try bystolic 10 mg once a day Hold metoprolol for now.   Please call us if you have new issues that need to be addressed before your next appt.  The office will contact you for a follow up Appt. In 6 months

## 2010-12-01 NOTE — Assessment & Plan Note (Signed)
She reports that her blood pressure has been better controlled, too low with lisinopril and she is not on this anymore though it does go up in the afternoon. She would like a medication for her tachycardia and blood pressure. We have suggested she try bystolic 10 mg daily.

## 2010-12-01 NOTE — Assessment & Plan Note (Signed)
Short of breath is mild. Some of this could be secondary to obesity, deconditioning. She has started walking with her sister.

## 2010-12-01 NOTE — Assessment & Plan Note (Signed)
We have strongly encouraged her to watch her diet, her weight in an effort to avoid adding additional diabetes medications. Previous hemoglobin A1c was much improved though has worsened recently.

## 2010-12-04 ENCOUNTER — Encounter: Payer: Self-pay | Admitting: Cardiovascular Disease

## 2010-12-15 ENCOUNTER — Telehealth: Payer: Self-pay

## 2010-12-15 MED ORDER — NEBIVOLOL HCL 5 MG PO TABS: 5.0000 mg | ORAL_TABLET | Freq: Every day | ORAL | Status: DC

## 2010-12-15 NOTE — Telephone Encounter (Signed)
Refill sent to [facility] pharmacy employee for bystolic 5 mg one tablet daily.

## 2011-02-13 HISTORY — PX: CARDIAC CATHETERIZATION: SHX172

## 2011-06-28 ENCOUNTER — Ambulatory Visit (INDEPENDENT_AMBULATORY_CARE_PROVIDER_SITE_OTHER): Payer: No Typology Code available for payment source | Admitting: Cardiovascular Disease

## 2011-06-28 ENCOUNTER — Encounter: Payer: Self-pay | Admitting: Cardiovascular Disease

## 2011-06-28 VITALS — BP 158/80 | HR 99 | Ht 63.0 in | Wt 211.0 lb

## 2011-06-28 DIAGNOSIS — E781 Pure hyperglyceridemia: Secondary | ICD-10-CM

## 2011-06-28 DIAGNOSIS — Z5181 Encounter for therapeutic drug level monitoring: Secondary | ICD-10-CM

## 2011-06-28 DIAGNOSIS — R079 Chest pain, unspecified: Secondary | ICD-10-CM

## 2011-06-28 DIAGNOSIS — G4733 Obstructive sleep apnea (adult) (pediatric): Secondary | ICD-10-CM

## 2011-06-28 DIAGNOSIS — Z9989 Dependence on other enabling machines and devices: Secondary | ICD-10-CM

## 2011-06-28 DIAGNOSIS — R0602 Shortness of breath: Secondary | ICD-10-CM

## 2011-06-28 DIAGNOSIS — I10 Essential (primary) hypertension: Secondary | ICD-10-CM

## 2011-06-28 DIAGNOSIS — R000 Tachycardia, unspecified: Secondary | ICD-10-CM

## 2011-06-28 DIAGNOSIS — M79603 Pain in arm, unspecified: Secondary | ICD-10-CM | POA: Insufficient documentation

## 2011-06-28 DIAGNOSIS — E119 Type 2 diabetes mellitus without complications: Secondary | ICD-10-CM

## 2011-06-28 DIAGNOSIS — Z01818 Encounter for other preprocedural examination: Secondary | ICD-10-CM

## 2011-06-28 MED ORDER — NITROGLYCERIN 0.4 MG SL SUBL: 0.4000 mg | SUBLINGUAL_TABLET | SUBLINGUAL | Status: DC | PRN

## 2011-06-28 NOTE — Assessment & Plan Note (Signed)
We have suggested she increase her bystolic to 10 mg daily if her heart rate at home continues to be elevated. She has a blood pressure cuff and will monitor heart rate.

## 2011-06-28 NOTE — Assessment & Plan Note (Signed)
SOB likely secondary to deconditioning, tachycardia. Unable to exclude ischemia. Cardiac cath planned per the patients schedule.

## 2011-06-28 NOTE — Assessment & Plan Note (Signed)
We have encouraged weight loss and exercise. She is unable to tolerate statins. We will discuss other options in follow up, including fibrates.

## 2011-06-28 NOTE — Assessment & Plan Note (Signed)
We have encouraged continued exercise, careful diet management in an effort to lose weight. 

## 2011-06-28 NOTE — Assessment & Plan Note (Signed)
Blood pressure is well controlled on today's visit. No changes made to the medications. 

## 2011-06-28 NOTE — Assessment & Plan Note (Signed)
[name] is very concerned about her chest pain. She had a recent stress test at the end of 2012. She does not want to repeat a stress test and is not happy to just monitor her symptoms. She is interested in cardiac cath though she is unavailable to do this next week as she has no coverage at work. We will potentially schedule this in early June as her schedule permits. We have sent a script for NTG SL for symptoms. She will continue aspirin.

## 2011-06-28 NOTE — Patient Instructions (Signed)
You are doing well. No medication changes were made. Please take extra bystolic for tachycardia  We will schedule a cardiac cath for Wednesday June 12th  Please call us if you have new issues that need to be addressed before your next appt.

## 2011-06-28 NOTE — Assessment & Plan Note (Signed)
Still using her CPAP

## 2011-06-28 NOTE — Progress Notes (Signed)
Patient ID: [name], female    DOB: Aug 18, 1945, 66 y.o.   MRN: [id number]  HPI Comments:  66 year old woman with a strong family history of  heart disease, Obesity, diabetes, obstructive sleep apnea on CPAP, chronic tachycardia, hyperlipidemia with very elevated triglycerides, chronic edema,  seen in the hospital for severe hypertension and chest pain, Admission on October 1, discharge on November 14 2010. She ruled out by cardiac enzymes, chest CT was negative for PE. She had a stress test that showed no ischemia. Her medications were adjusted. CT Scan did show fatty infiltration of the liver, granulomatous lesion of the left lower lobe  She reports that she has started to have worsening chest pain, described as a deep pain under the left breast, radiating to the left shoulder and jaw. She has had "too many episodes to count'. This has been getting worse over the past few months. She is very concerned as she has a significant family hx. Mother had CAD in her 13s, father with MI in his late 15s.   She does not tolerate statins and is currently not taking any medication for cholesterol. She has been tolerating bystolic well. She continues to have palpitations/tachycardia, also some SOB with exertion. Recent lab work shows her hemoglobin A1c  7.5. Old Cholesterol 225, triglycerides 630, HDL 36  normal aortic valve and mild  CAD by cath 5/09.   Was admitted in January 2010 with dyspnea and presyncope. CT and echo normal.   EKG shows sinus tachycardia 99 bpm no significant ST or T wave changes    Outpatient Encounter Prescriptions as of 06/28/2011  Medication Sig Dispense Refill  . aspirin 81 MG tablet Take 81 mg by mouth daily.        [name] esomeprazole (NEXIUM) 40 MG capsule Take 40 mg by mouth 2 (two) times daily.       [name] estrogens, conjugated, (PREMARIN) 0.625 MG tablet Take 0.625 mg by mouth daily. Take daily for 21 days then do not take for 7 days.       [name] etodolac (LODINE) 300 MG capsule  Take 300 mg by mouth daily.      . fish oil-omega-3 fatty acids 1000 MG capsule Take 2 g by mouth daily.        [name] gabapentin (NEURONTIN) 300 MG capsule Take 300 mg by mouth 3 (three) times daily.        [name] glimepiride (AMARYL) 1 MG tablet Take 1 mg by mouth daily before breakfast.      . nebivolol (BYSTOLIC) 5 MG tablet Take 1 tablet (5 mg total) by mouth daily.  30 tablet  6  . torsemide (DEMADEX) 20 MG tablet Take one tablet every third day.      . traZODone (DESYREL) 50 MG tablet Take two tablets at bedtime.      [name] venlafaxine (EFFEXOR) 75 MG tablet Take 75 mg by mouth daily.      . nitroGLYCERIN (NITROSTAT) 0.4 MG SL tablet Place 1 tablet (0.4 mg total) under the tongue every 5 (five) minutes as needed for chest pain.  25 tablet  3  . DISCONTD: glipiZIDE (GLUCOTROL) 5 MG 24 hr tablet Take 5 mg by mouth daily.            Review of Systems  Constitutional: Negative.   HENT: Negative.   Eyes: Negative.   Respiratory: Positive for chest tightness and shortness of breath.   Cardiovascular: Positive for chest pain and leg swelling.  Gastrointestinal: Negative.  Musculoskeletal: Negative.   Skin: Negative.   Neurological: Negative.   Hematological: Negative.   Psychiatric/Behavioral: Negative.   All other systems reviewed and are negative.   BP 158/80  Pulse 99  Ht 5\' 3"  (1.6 m)  Wt 211 lb (95.709 kg)  BMI 37.38 kg/m2  Physical Exam  Constitutional: She is oriented to person, place, and time. She appears well-developed and well-nourished.  HENT:  Head: Normocephalic.  Nose: Nose normal.  Mouth/Throat: Oropharynx is clear and moist.  Eyes: Conjunctivae are normal. Pupils are equal, round, and reactive to light.  Neck: Normal range of motion. Neck supple. No JVD present.  Cardiovascular: Regular rhythm, S1 normal, S2 normal and intact distal pulses.   No extrasystoles are present. Exam reveals no gallop and no friction rub.   Murmur heard.  Crescendo systolic murmur is present  with a grade of 2/6  Pulmonary/Chest: Effort normal and breath sounds normal. No respiratory distress. She has no wheezes. She has no rales. She exhibits no tenderness.  Abdominal: Soft. Bowel sounds are normal. She exhibits no distension. There is no tenderness.       Obese  Musculoskeletal: Normal range of motion. She exhibits no edema and no tenderness.  Lymphadenopathy:    She has no cervical adenopathy.  Neurological: She is alert and oriented to person, place, and time. Coordination normal.  Skin: Skin is warm and dry. No rash noted. No erythema.  Psychiatric: She has a normal mood and affect. Her behavior is normal. Judgment and thought content normal.       anxious      Assessment and Plan

## 2011-07-23 ENCOUNTER — Ambulatory Visit: Payer: Self-pay | Admitting: Cardiovascular Disease

## 2011-07-23 LAB — BASIC METABOLIC PANEL
Anion Gap: 9 (ref 7–16)
Calcium, Total: 9.2 mg/dL (ref 8.5–10.1)
Chloride: 100 mmol/L (ref 98–107)
Creatinine: 0.81 mg/dL (ref 0.60–1.30)
EGFR (African American): >60
EGFR (Non-African Amer.): >60
Glucose: 135 mg/dL — ABNORMAL HIGH (ref 65–99)
Potassium: 4.1 mmol/L (ref 3.5–5.1)
Sodium: 137 mmol/L (ref 136–145)

## 2011-07-23 LAB — CBC WITH DIFFERENTIAL/PLATELET
Basophil #: 0 10*3/uL (ref 0.0–0.1)
Basophil %: 0.2 %
Lymphocyte #: 2.1 10*3/uL (ref 1.0–3.6)
MCHC: 33 g/dL (ref 32.0–36.0)
MCV: 84 fL (ref 80–100)
Monocyte %: 5.5 %
Neutrophil #: 4.8 10*3/uL (ref 1.4–6.5)
Neutrophil %: 64.9 %
Platelet: 202 10*3/uL (ref 150–440)

## 2011-07-23 LAB — PROTIME-INR
INR: 0.9
Prothrombin Time: 12 secs (ref 11.5–14.7)

## 2011-07-25 ENCOUNTER — Other Ambulatory Visit: Payer: Self-pay

## 2011-07-25 ENCOUNTER — Ambulatory Visit (INDEPENDENT_AMBULATORY_CARE_PROVIDER_SITE_OTHER): Payer: No Typology Code available for payment source

## 2011-07-25 ENCOUNTER — Ambulatory Visit: Payer: Self-pay | Admitting: Cardiovascular Disease

## 2011-07-25 VITALS — BP 122/70 | HR 110

## 2011-07-25 DIAGNOSIS — R609 Edema, unspecified: Secondary | ICD-10-CM

## 2011-07-25 DIAGNOSIS — I2510 Atherosclerotic heart disease of native coronary artery without angina pectoris: Secondary | ICD-10-CM

## 2011-07-25 DIAGNOSIS — R0602 Shortness of breath: Secondary | ICD-10-CM

## 2011-07-25 HISTORY — PX: LEFT HEART CATH AND CORONARY ANGIOGRAPHY: CATH118249

## 2011-07-25 MED ORDER — EZETIMIBE-SIMVASTATIN 10-20 MG PO TABS: 0.5000 | ORAL_TABLET | Freq: Every day | ORAL | Status: DC

## 2011-07-25 MED ORDER — ISOSORBIDE MONONITRATE ER 30 MG PO TB24: 30.0000 mg | ORAL_TABLET | Freq: Every day | ORAL | Status: DC

## 2011-07-25 NOTE — Progress Notes (Signed)
Pt s/p LHC via right fem artery. Pt's sister called stating pt is c/o RLE numbness from groin to knee. She is also c/o lower back pain.  I advised to bring her in to assess groin  Upon assessment pt has strong pedal pulse. There is no bruising, edema, redness or drainage noted at right groin site.  Pt says she has hx sciatica and lower back pain is no worse than usual.  Will discuss with Dr. Mariah Milling.

## 2011-07-25 NOTE — Progress Notes (Signed)
Pt informed

## 2011-07-25 NOTE — Progress Notes (Signed)
Pt had LHC today. Dr. Mariah Milling gave order to start isosorbide mononitrate 30 mg qd and Vytorin 10/20 mg 1/2 tablet daily.  Samples of Vytorin left at [facility] sent to pharm.

## 2011-08-02 ENCOUNTER — Encounter: Payer: Self-pay | Admitting: Cardiovascular Disease

## 2011-08-08 NOTE — Patient Instructions (Addendum)
Groin examined

## 2011-08-09 ENCOUNTER — Encounter: Payer: Self-pay | Admitting: Cardiovascular Disease

## 2011-08-09 ENCOUNTER — Ambulatory Visit (INDEPENDENT_AMBULATORY_CARE_PROVIDER_SITE_OTHER): Payer: No Typology Code available for payment source | Admitting: Cardiovascular Disease

## 2011-08-09 VITALS — BP 150/72 | HR 95 | Ht 63.0 in | Wt 216.5 lb

## 2011-08-09 DIAGNOSIS — R079 Chest pain, unspecified: Secondary | ICD-10-CM

## 2011-08-09 DIAGNOSIS — M79603 Pain in arm, unspecified: Secondary | ICD-10-CM

## 2011-08-09 DIAGNOSIS — R000 Tachycardia, unspecified: Secondary | ICD-10-CM

## 2011-08-09 DIAGNOSIS — E119 Type 2 diabetes mellitus without complications: Secondary | ICD-10-CM

## 2011-08-09 DIAGNOSIS — M79609 Pain in unspecified limb: Secondary | ICD-10-CM

## 2011-08-09 DIAGNOSIS — I10 Essential (primary) hypertension: Secondary | ICD-10-CM

## 2011-08-09 DIAGNOSIS — E785 Hyperlipidemia, unspecified: Secondary | ICD-10-CM | POA: Insufficient documentation

## 2011-08-09 MED ORDER — NEBIVOLOL HCL 10 MG PO TABS: 10.0000 mg | ORAL_TABLET | Freq: Every day | ORAL | Status: DC

## 2011-08-09 NOTE — Assessment & Plan Note (Signed)
Blood pressure is elevated. She will increase her beta blocker to 10 mg daily

## 2011-08-09 NOTE — Assessment & Plan Note (Signed)
She is tolerating Vytorin 10/20 mg pill cut in half. We have suggested that we check her cholesterol next week and if it continues to be above goal, LDL less than 70, that she increase the Vytorin to a full pill. She will likely need fenofibrate for her triglycerides. She has followup with Dr. Hyacinth Meeker in the next week or so for blood work.

## 2011-08-09 NOTE — Progress Notes (Signed)
Patient ID: [name], female    DOB: [birth year], 66 y.o.   MRN: [id number]  HPI Comments:  66 year old woman with a strong family history of  heart disease, cardiac catheterization in 2009 and June 2013  showing moderate LAD, diagonal and RCA disease, Obesity, diabetes, obstructive sleep apnea on CPAP, chronic tachycardia, hyperlipidemia with very elevated triglycerides, chronic edema,  seen in the hospital for severe hypertension and chest pain, Admission on October 1, discharge on November 14 2010. She ruled out by cardiac enzymes, chest CT was negative for PE. She had a stress test that showed no ischemia. Her medications were adjusted. CT Scan did show fatty infiltration of the liver, granulomatous lesion of the left lower lobe  She had worsening chest pain and had a cardiac catheterization 07/25/2011 This showed left dominant coronary system with moderate mid LAD, proximal diagonal #1 and proximal RCA disease all estimated at 50%, normal LV systolic function.  She does not tolerate statins. She has been tolerating bystolic well. She continues to have palpitations/tachycardia, also some SOB with exertion. Recent lab work shows her hemoglobin A1c  7.5. Old Cholesterol 225, triglycerides 630, HDL 36  EKG shows sinus tachycardia 95 bpm no significant ST or T wave changes    Outpatient Encounter Prescriptions as of 08/09/2011  Medication Sig Dispense Refill  . aspirin 81 MG tablet Take 81 mg by mouth daily.        [name] esomeprazole (NEXIUM) 40 MG capsule Take 40 mg by mouth 2 (two) times daily.       [name] estrogens, conjugated, (PREMARIN) 0.625 MG tablet Take 0.625 mg by mouth daily. Take daily for 21 days then do not take for 7 days.       [name] etodolac (LODINE) 300 MG capsule Take 300 mg by mouth daily.      [name] ezetimibe-simvastatin (VYTORIN) 10-20 MG per tablet Take 0.5 tablets by mouth at bedtime.  45 tablet  3  . gabapentin (NEURONTIN) 300 MG capsule Take 300 mg by mouth 3 (three) times daily.         [name] glimepiride (AMARYL) 1 MG tablet Take 1 mg by mouth 2 (two) times daily.       . isosorbide mononitrate (IMDUR) 30 MG 24 hr tablet Take 30 mg by mouth as needed.      . nebivolol (BYSTOLIC) 10 MG tablet Take 1 tablet (10 mg total) by mouth daily.  90 tablet  3  . nitroGLYCERIN (NITROSTAT) 0.4 MG SL tablet Place 1 tablet (0.4 mg total) under the tongue every 5 (five) minutes as needed for chest pain.  25 tablet  3  . omega-3 acid ethyl esters (LOVAZA) 1 G capsule Take 2 g by mouth daily.      [name] torsemide (DEMADEX) 20 MG tablet Take one tablet every third day.      . traZODone (DESYREL) 100 MG tablet Take 100 mg by mouth at bedtime.      [name] venlafaxine XR (EFFEXOR-XR) 150 MG 24 hr capsule Take 150 mg by mouth daily.       Review of Systems  Constitutional: Negative.   HENT: Negative.   Eyes: Negative.   Cardiovascular:       Left arm discomfort  Gastrointestinal: Negative.   Musculoskeletal: Negative.   Skin: Negative.   Neurological: Negative.   Hematological: Negative.   Psychiatric/Behavioral: Negative.   All other systems reviewed and are negative.   BP 150/72  Pulse 95  Ht 5\' 3"  (1.6 m)  Wt 216 lb 8 oz (98.204 kg)  BMI 38.35 kg/m2  Physical Exam  Constitutional: She is oriented to person, place, and time. She appears well-developed and well-nourished.  HENT:  Head: Normocephalic.  Nose: Nose normal.  Mouth/Throat: Oropharynx is clear and moist.  Eyes: Conjunctivae are normal. Pupils are equal, round, and reactive to light.  Neck: Normal range of motion. Neck supple. No JVD present.  Cardiovascular: Regular rhythm, S1 normal, S2 normal and intact distal pulses.   No extrasystoles are present. Exam reveals no gallop and no friction rub.   Murmur heard.  Crescendo systolic murmur is present with a grade of 2/6  Pulmonary/Chest: Effort normal and breath sounds normal. No respiratory distress. She has no wheezes. She has no rales. She exhibits no tenderness.    Abdominal: Soft. Bowel sounds are normal. She exhibits no distension. There is no tenderness.       Obese  Musculoskeletal: Normal range of motion. She exhibits no edema and no tenderness.  Lymphadenopathy:    She has no cervical adenopathy.  Neurological: She is alert and oriented to person, place, and time. Coordination normal.  Skin: Skin is warm and dry. No rash noted. No erythema.  Psychiatric: She has a normal mood and affect. Her behavior is normal. Judgment and thought content normal.       anxious      Assessment and Plan

## 2011-08-09 NOTE — Assessment & Plan Note (Signed)
Heart rate is mildly elevated, possibly from anxiety. We have suggested she increase her bystolic to 10 mg daily.

## 2011-08-09 NOTE — Assessment & Plan Note (Signed)
Left arm pain is slightly atypical in nature and not likely from ischemia given recent findings on her cardiac catheterization.

## 2011-08-09 NOTE — Patient Instructions (Addendum)
You are doing well.  If your cholesterol continues to run high (cholesterol >150), consider increase vytorin to full pill For triglycerides, consider fenofibrate (tricor/triplix)  Please increase the Bystolic to 10 mg daily for blood pressure and heart rate  Please call us if you have new issues that need to be addressed before your next appt.  Your physician wants you to follow-up in: 6 months.  You will receive a reminder letter in the mail two months in advance. If you don't receive a letter, please call our office to schedule the follow-up appointment.

## 2011-08-09 NOTE — Assessment & Plan Note (Signed)
We have encouraged continued exercise, careful diet management in an effort to lose weight. 

## 2011-08-23 ENCOUNTER — Encounter: Payer: Self-pay | Admitting: Cardiovascular Disease

## 2011-09-07 ENCOUNTER — Ambulatory Visit: Payer: Self-pay | Admitting: Internal Medicine

## 2011-09-11 ENCOUNTER — Ambulatory Visit: Payer: Self-pay | Admitting: Internal Medicine

## 2012-06-09 ENCOUNTER — Ambulatory Visit (INDEPENDENT_AMBULATORY_CARE_PROVIDER_SITE_OTHER): Payer: Medicare Other | Admitting: Cardiovascular Disease

## 2012-06-09 ENCOUNTER — Encounter: Payer: Self-pay | Admitting: Cardiovascular Disease

## 2012-06-09 VITALS — BP 120/64 | HR 77 | Ht 63.0 in | Wt 208.5 lb

## 2012-06-09 DIAGNOSIS — E119 Type 2 diabetes mellitus without complications: Secondary | ICD-10-CM

## 2012-06-09 DIAGNOSIS — I25118 Atherosclerotic heart disease of native coronary artery with other forms of angina pectoris: Secondary | ICD-10-CM | POA: Insufficient documentation

## 2012-06-09 DIAGNOSIS — R0789 Other chest pain: Secondary | ICD-10-CM

## 2012-06-09 DIAGNOSIS — I10 Essential (primary) hypertension: Secondary | ICD-10-CM

## 2012-06-09 DIAGNOSIS — I2510 Atherosclerotic heart disease of native coronary artery without angina pectoris: Secondary | ICD-10-CM | POA: Insufficient documentation

## 2012-06-09 DIAGNOSIS — R079 Chest pain, unspecified: Secondary | ICD-10-CM

## 2012-06-09 DIAGNOSIS — E785 Hyperlipidemia, unspecified: Secondary | ICD-10-CM

## 2012-06-09 NOTE — Assessment & Plan Note (Signed)
Chest pain concerning for angina. Underlying coronary artery disease with periodic chest pain with heavy exertion. Symptoms are rare. She tries to pace herself. She can try isosorbide if worsening symptoms, also nitroglycerin. We have asked her to call our office if she starts having worsening symptoms. Cardiac catheterization done last year with stable moderate disease.

## 2012-06-09 NOTE — Assessment & Plan Note (Signed)
Cholesterol continues to run high. We have suggested she try to take a full Vytorin pill

## 2012-06-09 NOTE — Progress Notes (Signed)
Patient ID: [name], female    DOB: Apr 05, 1945, 67 y.o.   MRN: [id number]  HPI Comments:  67 year old woman with a strong family history of  heart disease, cardiac catheterization in 2009 and June 2013  showing moderate LAD, diagonal and RCA disease, Obesity, diabetes, obstructive sleep apnea on CPAP, chronic tachycardia, hyperlipidemia with very elevated triglycerides, chronic edema,  seen in the hospital for severe hypertension and chest pain, Admission on October 1, discharge on November 14 2010. She ruled out by cardiac enzymes, chest CT was negative for PE. She had a stress test that showed no ischemia. Her medications were adjusted. CT Scan did show fatty infiltration of the liver, granulomatous lesion of the left lower lobe  She had worsening chest pain and had a cardiac catheterization 07/25/2011 This showed left dominant coronary system with moderate mid LAD, proximal diagonal #1 and proximal RCA disease all estimated at 50%, normal LV systolic function.  She does not tolerate statins. She has been tolerating bystolic well. Occasional palpitations.  hemoglobin A1c  7.5. Old Cholesterol 225, triglycerides 630, HDL 36, now down to total cholesterol 199, LDL 94 on low-dose Vytorin  Continues to have rare episodes of chest pain. Palpitations mainly at nighttime. She has not tried isosorbide or nitroglycerin. One episode of severe shortness of breath after going to the bathroom and then back to bed. She is able to push herself at a moderate pace, heavy exertion causing symptoms  EKG shows sinus rhythm with rate 77 beats per minute, no significant ST or T wave changes    Outpatient Encounter Prescriptions as of 67/28/2014  Medication Sig Dispense Refill  . aspirin 81 MG tablet Take 81 mg by mouth daily.        [name] esomeprazole (NEXIUM) 40 MG capsule Take 40 mg by mouth 2 (two) times daily.       [name] etodolac (LODINE) 300 MG capsule Take 300 mg by mouth daily.      [name] ezetimibe-simvastatin  (VYTORIN) 10-20 MG per tablet Take 0.5 tablets by mouth at bedtime.  45 tablet  3  . gabapentin (NEURONTIN) 300 MG capsule Take 300 mg by mouth 3 (three) times daily.        [name] glimepiride (AMARYL) 1 MG tablet Take 1 mg by mouth 2 (two) times daily.       . hydrOXYzine (ATARAX/VISTARIL) 50 MG tablet Take 25 mg by mouth at bedtime.       . isosorbide mononitrate (IMDUR) 30 MG 24 hr tablet Take 30 mg by mouth as needed.      . nebivolol (BYSTOLIC) 10 MG tablet Take 5 mg by mouth 2 (two) times daily.      . nitroGLYCERIN (NITROSTAT) 0.4 MG SL tablet Place 1 tablet (0.4 mg total) under the tongue every 5 (five) minutes as needed for chest pain.  25 tablet  3  . torsemide (DEMADEX) 20 MG tablet Take one tablet every third day.      . traZODone (DESYREL) 50 MG tablet Take 50 mg by mouth at bedtime.      [name] venlafaxine XR (EFFEXOR-XR) 150 MG 24 hr capsule Take 150 mg by mouth daily.       Review of Systems  Constitutional: Negative.   HENT: Negative.   Eyes: Negative.   Respiratory: Negative.   Cardiovascular: Negative.        Left arm discomfort  Gastrointestinal: Negative.   Musculoskeletal: Negative.   Skin: Negative.   Neurological: Negative.   Psychiatric/Behavioral:  Negative.   All other systems reviewed and are negative.   BP 120/64  Pulse 77  Ht 5\' 3"  (1.6 m)  Wt 208 lb 8 oz (94.575 kg)  BMI 36.94 kg/m2  Physical Exam  Constitutional: She is oriented to person, place, and time. She appears well-developed and well-nourished.  HENT:  Head: Normocephalic.  Nose: Nose normal.  Mouth/Throat: Oropharynx is clear and moist.  Eyes: Conjunctivae are normal. Pupils are equal, round, and reactive to light.  Neck: Normal range of motion. Neck supple. No JVD present.  Cardiovascular: Regular rhythm, S1 normal, S2 normal and intact distal pulses.   No extrasystoles are present. Exam reveals no gallop and no friction rub.   Murmur heard.  Crescendo systolic murmur is present with a grade of  2/6  Pulmonary/Chest: Effort normal and breath sounds normal. No respiratory distress. She has no wheezes. She has no rales. She exhibits no tenderness.  Abdominal: Soft. Bowel sounds are normal. She exhibits no distension. There is no tenderness.  Obese  Musculoskeletal: Normal range of motion. She exhibits no edema and no tenderness.  Lymphadenopathy:    She has no cervical adenopathy.  Neurological: She is alert and oriented to person, place, and time. Coordination normal.  Skin: Skin is warm and dry. No rash noted. No erythema.  Psychiatric: She has a normal mood and affect. Her behavior is normal. Judgment and thought content normal.  anxious      Assessment and Plan

## 2012-06-09 NOTE — Patient Instructions (Addendum)
When your leg pain is better, try to take a full vytorin pill If tolerated, call the office  Ask Dr. Hyacinth Meeker about cymbalta or lyrica  Consider starting isosorbide if you have frequent episodes of shortness of breath or chest pain Call the office for frequent chest pain episodes  Please call us if you have new issues that need to be addressed before your next appt.  Your physician wants you to follow-up in: 6 months.  You will receive a reminder letter in the mail two months in advance. If you don't receive a letter, please call our office to schedule the follow-up appointment.

## 2012-06-09 NOTE — Assessment & Plan Note (Signed)
Cardiac catheterization last year showing moderate 3 vessel disease. Continue aggressive cholesterol management, and  Anginal medication.

## 2012-06-09 NOTE — Assessment & Plan Note (Signed)
We have encouraged continued exercise, careful diet management in an effort to lose weight. 

## 2012-06-09 NOTE — Assessment & Plan Note (Signed)
Blood pressure is well controlled on today's visit. No changes made to the medications. 

## 2012-09-18 ENCOUNTER — Ambulatory Visit: Payer: Self-pay | Admitting: Internal Medicine

## 2012-09-23 ENCOUNTER — Other Ambulatory Visit: Payer: Self-pay

## 2012-09-23 MED ORDER — NITROGLYCERIN 0.4 MG SL SUBL: 0.4000 mg | SUBLINGUAL_TABLET | SUBLINGUAL | Status: DC | PRN

## 2012-09-23 NOTE — Telephone Encounter (Signed)
Refilled Nitroglycerin sent to [facility] pharmacy.

## 2012-12-08 ENCOUNTER — Encounter (INDEPENDENT_AMBULATORY_CARE_PROVIDER_SITE_OTHER): Payer: Self-pay

## 2012-12-08 ENCOUNTER — Ambulatory Visit (INDEPENDENT_AMBULATORY_CARE_PROVIDER_SITE_OTHER): Payer: Medicare Other | Admitting: Cardiovascular Disease

## 2012-12-11 ENCOUNTER — Encounter: Payer: Self-pay | Admitting: Cardiovascular Disease

## 2012-12-11 ENCOUNTER — Ambulatory Visit (INDEPENDENT_AMBULATORY_CARE_PROVIDER_SITE_OTHER): Payer: Medicare Other | Admitting: Cardiovascular Disease

## 2012-12-11 VITALS — BP 139/62 | HR 75 | Ht 63.0 in | Wt 196.0 lb

## 2012-12-11 DIAGNOSIS — I10 Essential (primary) hypertension: Secondary | ICD-10-CM

## 2012-12-11 DIAGNOSIS — E119 Type 2 diabetes mellitus without complications: Secondary | ICD-10-CM

## 2012-12-11 DIAGNOSIS — R079 Chest pain, unspecified: Secondary | ICD-10-CM

## 2012-12-11 DIAGNOSIS — E785 Hyperlipidemia, unspecified: Secondary | ICD-10-CM

## 2012-12-11 DIAGNOSIS — I2510 Atherosclerotic heart disease of native coronary artery without angina pectoris: Secondary | ICD-10-CM

## 2012-12-11 MED ORDER — EZETIMIBE 10 MG PO TABS: 10.0000 mg | ORAL_TABLET | Freq: Every day | ORAL | Status: DC

## 2012-12-11 MED ORDER — FENOFIBRATE 145 MG PO TABS: 145.0000 mg | ORAL_TABLET | Freq: Every day | ORAL | Status: DC

## 2012-12-11 NOTE — Assessment & Plan Note (Signed)
We have encouraged continued exercise, careful diet management in an effort to lose weight. 

## 2012-12-11 NOTE — Assessment & Plan Note (Signed)
Blood pressure is well controlled on today's visit. No changes made to the medications. 

## 2012-12-11 NOTE — Assessment & Plan Note (Signed)
50% LAD and RCA disease in the past. Last catheterization in early 2013

## 2012-12-11 NOTE — Progress Notes (Signed)
Patient ID: [name], female    DOB: 11/08/1945, 67 y.o.   MRN: [id number]  HPI Comments: 67 year old woman with a strong family history of  heart disease, cardiac catheterization in 2009 and June 2013  showing moderate LAD, diagonal and RCA disease, Obesity, diabetes, obstructive sleep apnea on CPAP,  Tachycardia episodes, hyperlipidemia with very elevated triglycerides, previous leg edema,  seen in the hospital for severe hypertension and chest pain, Admission on October 1, discharge on November 14 2010. She ruled out by cardiac enzymes, chest CT was negative for PE. She had a stress test that showed no ischemia. Her medications were adjusted. CT Scan did show fatty infiltration of the liver, granulomatous lesion of the left lower lobe  She had worsening chest pain and had a cardiac catheterization 07/25/2011 This showed left dominant coronary system with moderate mid LAD, proximal diagonal #1 and proximal RCA disease all estimated at 50%, normal LV systolic function.  She does not tolerate statins. In followup today, she reports that she is unable to tolerate vytorin. She was able to take this for a while, now reports that it causes restless leg. She's currently not on any cholesterol medication. She reports episodes of "stinging" in her chest that seems to come on with exertion. Some chest pressure in the morning. Occasional episodes of nausea. She is very anxious about her health, brother recently had bypass surgery and she does not want bypass surgery.  Some episodes of palpitations Recent laboratory hemoglobin A1c 7.8, total cholesterol 187, LDL 60, HDL 36, triglycerides 460  EKG shows sinus rhythm with rate 75 beats per minute, no significant ST or T wave changes    Outpatient Encounter Prescriptions as of 12/11/2012  Medication Sig Dispense Refill  . aspirin 81 MG tablet Take 81 mg by mouth daily.        [name] esomeprazole (NEXIUM) 40 MG capsule Take 40 mg by mouth 2 (two) times daily.        [name] etodolac (LODINE) 300 MG capsule Take 300 mg by mouth daily.      [name] ezetimibe-simvastatin (VYTORIN) 10-20 MG per tablet Take 0.5 tablets by mouth at bedtime.  45 tablet  3  . gabapentin (NEURONTIN) 300 MG capsule Take 300 mg by mouth 3 (three) times daily.        [name] glimepiride (AMARYL) 1 MG tablet Take 1 mg by mouth 2 (two) times daily.       . hydrOXYzine (ATARAX/VISTARIL) 50 MG tablet Take 25 mg by mouth at bedtime.       . isosorbide mononitrate (IMDUR) 30 MG 24 hr tablet Take 30 mg by mouth as needed.      . nebivolol (BYSTOLIC) 10 MG tablet Take 5 mg by mouth 2 (two) times daily.      . nitroGLYCERIN (NITROSTAT) 0.4 MG SL tablet Place 1 tablet (0.4 mg total) under the tongue every 5 (five) minutes as needed for chest pain.  25 tablet  3  . torsemide (DEMADEX) 20 MG tablet Take one tablet every third day.      . traZODone (DESYREL) 50 MG tablet Take 50 mg by mouth at bedtime.      [name] venlafaxine XR (EFFEXOR-XR) 150 MG 24 hr capsule Take 150 mg by mouth daily.       No facility-administered encounter medications on file as of 12/11/2012.    Review of Systems  Constitutional: Negative.   HENT: Negative.   Eyes: Negative.   Respiratory: Positive for chest tightness.  Cardiovascular: Positive for chest pain.  Gastrointestinal: Negative.   Endocrine: Negative.   Musculoskeletal: Negative.   Skin: Negative.   Allergic/Immunologic: Negative.   Neurological: Negative.   Hematological: Negative.   Psychiatric/Behavioral: Negative.   All other systems reviewed and are negative.   BP 139/62  Pulse 75  Ht 5\' 3"  (1.6 m)  Wt 196 lb (88.905 kg)  BMI 34.73 kg/m2  Physical Exam  Constitutional: She is oriented to person, place, and time. She appears well-developed and well-nourished.  HENT:  Head: Normocephalic.  Nose: Nose normal.  Mouth/Throat: Oropharynx is clear and moist.  Eyes: Conjunctivae are normal. Pupils are equal, round, and reactive to light.  Neck: Normal range of  motion. Neck supple. No JVD present.  Cardiovascular: Regular rhythm, S1 normal, S2 normal and intact distal pulses.   No extrasystoles are present. Exam reveals no gallop and no friction rub.   Murmur heard.  Crescendo systolic murmur is present with a grade of 2/6  Pulmonary/Chest: Effort normal and breath sounds normal. No respiratory distress. She has no wheezes. She has no rales. She exhibits no tenderness.  Abdominal: Soft. Bowel sounds are normal. She exhibits no distension. There is no tenderness.  Obese  Musculoskeletal: Normal range of motion. She exhibits no edema and no tenderness.  Lymphadenopathy:    She has no cervical adenopathy.  Neurological: She is alert and oriented to person, place, and time. Coordination normal.  Skin: Skin is warm and dry. No rash noted. No erythema.  Psychiatric: She has a normal mood and affect. Her behavior is normal. Judgment and thought content normal.  anxious      Assessment and Plan

## 2012-12-11 NOTE — Assessment & Plan Note (Signed)
She has zetia at home and we have suggested she start this on a daily basis. She's not taking Vytorin as all statins cause restless leg syndrome for her. We've also added fenofibrate 1 daily

## 2012-12-11 NOTE — Patient Instructions (Signed)
You are doing well. Ok to try isosorbide once a day as needed  Try aleve for symptoms  We will call you when we have ranexa   Start zetia one a day Start fenofibrate one a day for cholesterol  Please call us if you have new issues that need to be addressed before your next appt.  Your physician wants you to follow-up in: 6 months.  You will receive a reminder letter in the mail two months in advance. If you don't receive a letter, please call our office to schedule the follow-up appointment.

## 2012-12-11 NOTE — Assessment & Plan Note (Signed)
Atypical with typical features, unable to exclude angina. We have offered stress testing. She prefers to wait at this time. Recommended she start isosorbide 30 mg daily. We do not have samples of ranexa at this time but will call her when samples are available.

## 2012-12-24 ENCOUNTER — Telehealth: Payer: Self-pay

## 2012-12-24 NOTE — Telephone Encounter (Signed)
Pt would like Ranexa samples.  

## 2012-12-26 ENCOUNTER — Telehealth: Payer: Self-pay | Admitting: *Deleted

## 2012-12-26 NOTE — Telephone Encounter (Signed)
Instructions have been placed with samples.

## 2012-12-26 NOTE — Telephone Encounter (Signed)
Spoke with pt today and she mentioned that she was told that she could try Ranexa at last ov. There were no samples currently available at the time or specific dosage mentioned. Please advise.

## 2012-12-26 NOTE — Telephone Encounter (Signed)
We can try ranexa 500 mg twice a day for 1 weeks Then 1000 mg twice a day for 1 week If helping, would continue ranexa.  We have samples

## 2012-12-26 NOTE — Telephone Encounter (Signed)
Left message to inform pt that sample are at front desk for pick up.

## 2012-12-27 ENCOUNTER — Other Ambulatory Visit: Payer: Self-pay | Admitting: Cardiovascular Disease

## 2012-12-29 ENCOUNTER — Other Ambulatory Visit: Payer: Self-pay | Admitting: *Deleted

## 2012-12-29 MED ORDER — NITROGLYCERIN 0.4 MG SL SUBL: 0.4000 mg | SUBLINGUAL_TABLET | SUBLINGUAL | Status: DC | PRN

## 2012-12-29 NOTE — Telephone Encounter (Signed)
Requested Prescriptions   Signed Prescriptions Disp Refills  . nitroGLYCERIN (NITROSTAT) 0.4 MG SL tablet 25 tablet 3    Sig: Place 1 tablet (0.4 mg total) under the tongue every 5 (five) minutes as needed for chest pain.    Authorizing Provider: GOLLAN, TIMOTHY J    Ordering User: LOPEZ, MARINA C    

## 2013-01-12 ENCOUNTER — Telehealth: Payer: Self-pay

## 2013-01-12 NOTE — Telephone Encounter (Signed)
Notified patient samples of Ranexa 500 mg available to pick up.

## 2013-01-12 NOTE — Telephone Encounter (Signed)
Pt would like Ranexa 500 mg filled. States it has helped her tremendously.

## 2013-02-17 ENCOUNTER — Telehealth: Payer: Self-pay | Admitting: *Deleted

## 2013-02-17 NOTE — Telephone Encounter (Signed)
Notified patient Ranexa 500 mg.

## 2013-02-17 NOTE — Telephone Encounter (Signed)
Needs samples of Ranexa 500 mg. Please call when ready

## 2013-02-20 ENCOUNTER — Other Ambulatory Visit: Payer: Self-pay

## 2013-02-20 MED ORDER — NITROGLYCERIN 0.4 MG SL SUBL: 0.4000 mg | SUBLINGUAL_TABLET | SUBLINGUAL | Status: DC | PRN

## 2013-02-20 NOTE — Telephone Encounter (Signed)
Nitro sent to [facility]

## 2013-04-08 ENCOUNTER — Other Ambulatory Visit: Payer: Self-pay | Admitting: *Deleted

## 2013-04-08 ENCOUNTER — Telehealth: Payer: Self-pay

## 2013-04-08 MED ORDER — RANOLAZINE ER 500 MG PO TB12: 500.0000 mg | ORAL_TABLET | Freq: Every day | ORAL | Status: DC

## 2013-04-08 NOTE — Telephone Encounter (Signed)
Placed samples Ranexa 500 mg at front desk for pick up.

## 2013-04-08 NOTE — Telephone Encounter (Signed)
Pt states she is out of Ranexa samples, but needs a prescription called in, please call, pt has some questions.. Also, would like samples to hold her until her mail order comes in.

## 2013-04-08 NOTE — Telephone Encounter (Signed)
Requested Prescriptions   Signed Prescriptions Disp Refills  . ranolazine (RANEXA) 500 MG 12 hr tablet 90 tablet 3    Sig: Take 1 tablet (500 mg total) by mouth daily.    Authorizing Provider: Minna Merritts    Ordering User: Britt Bottom

## 2013-04-10 ENCOUNTER — Other Ambulatory Visit: Payer: Self-pay | Admitting: *Deleted

## 2013-04-10 ENCOUNTER — Telehealth: Payer: Self-pay | Admitting: *Deleted

## 2013-04-10 MED ORDER — RANOLAZINE ER 500 MG PO TB12: 500.0000 mg | ORAL_TABLET | Freq: Two times a day (BID) | ORAL | Status: DC

## 2013-04-10 NOTE — Telephone Encounter (Signed)
Rx sent for Ranexa 500 mg bid sent to [facility].

## 2013-04-10 NOTE — Telephone Encounter (Signed)
LMTCB to verify medication needed to be sent in for refill.

## 2013-04-10 NOTE — Telephone Encounter (Signed)
Pt informed

## 2013-04-10 NOTE — Telephone Encounter (Signed)
Pt called back, states she needs Ranexa 500 twice a day.

## 2013-04-10 NOTE — Telephone Encounter (Signed)
Requested Prescriptions   Signed Prescriptions Disp Refills  . ranolazine (RANEXA) 500 MG 12 hr tablet 180 tablet 3    Sig: Take 1 tablet (500 mg total) by mouth 2 (two) times daily.    Authorizing Provider: Minna Merritts    Ordering User: Britt Bottom

## 2013-04-10 NOTE — Telephone Encounter (Signed)
She needs this for 2x a day not 1x a day. Please resend into pharmacy

## 2013-06-09 ENCOUNTER — Ambulatory Visit: Payer: Medicare Other | Admitting: Cardiovascular Disease

## 2013-06-15 ENCOUNTER — Encounter (INDEPENDENT_AMBULATORY_CARE_PROVIDER_SITE_OTHER): Payer: Self-pay

## 2013-06-15 ENCOUNTER — Ambulatory Visit (INDEPENDENT_AMBULATORY_CARE_PROVIDER_SITE_OTHER): Payer: Medicare Other | Admitting: Cardiovascular Disease

## 2013-06-15 ENCOUNTER — Encounter: Payer: Self-pay | Admitting: Cardiovascular Disease

## 2013-06-15 VITALS — BP 120/62 | HR 75 | Ht 63.0 in | Wt 212.5 lb

## 2013-06-15 DIAGNOSIS — I10 Essential (primary) hypertension: Secondary | ICD-10-CM

## 2013-06-15 DIAGNOSIS — E785 Hyperlipidemia, unspecified: Secondary | ICD-10-CM

## 2013-06-15 DIAGNOSIS — E119 Type 2 diabetes mellitus without complications: Secondary | ICD-10-CM

## 2013-06-15 DIAGNOSIS — I2510 Atherosclerotic heart disease of native coronary artery without angina pectoris: Secondary | ICD-10-CM

## 2013-06-15 DIAGNOSIS — R0789 Other chest pain: Secondary | ICD-10-CM

## 2013-06-15 DIAGNOSIS — G4733 Obstructive sleep apnea (adult) (pediatric): Secondary | ICD-10-CM

## 2013-06-15 DIAGNOSIS — Z9989 Dependence on other enabling machines and devices: Secondary | ICD-10-CM

## 2013-06-15 DIAGNOSIS — R079 Chest pain, unspecified: Secondary | ICD-10-CM

## 2013-06-15 MED ORDER — RANOLAZINE ER 1000 MG PO TB12: 1000.0000 mg | ORAL_TABLET | Freq: Two times a day (BID) | ORAL | Status: DC

## 2013-06-15 MED ORDER — CARVEDILOL 6.25 MG PO TABS: 6.2500 mg | ORAL_TABLET | Freq: Two times a day (BID) | ORAL | Status: DC

## 2013-06-15 NOTE — Assessment & Plan Note (Signed)
We have recommended that she have her cholesterol checked at the end of the summer now that her hemoglobin A1c has significantly improved. She does not want a cholesterol medication

## 2013-06-15 NOTE — Assessment & Plan Note (Signed)
Currently with no symptoms of angina. No further workup at this time. Continue current medication regimen. 

## 2013-06-15 NOTE — Assessment & Plan Note (Signed)
Blood pressure is well controlled on today's visit. No changes made to the medications. 

## 2013-06-15 NOTE — Patient Instructions (Addendum)
You are doing well. Please stop the bystolic when you run out Start the coreg 6.25 mg twice a day Monitor your blood pressure  Please call us if you have new issues that need to be addressed before your next appt.  Your physician wants you to follow-up in: 6 months.  You will receive a reminder letter in the mail two months in advance. If you don't receive a letter, please call our office to schedule the follow-up appointment.

## 2013-06-15 NOTE — Progress Notes (Signed)
Patient ID: [name], female    DOB: [birth year], 68 y.o.   MRN: [id number]  HPI Comments: 68 year old woman with a strong family history of  heart disease, cardiac catheterization in 2009 and June 2013  showing moderate LAD, diagonal and RCA disease, Obesity, diabetes, obstructive sleep apnea on CPAP,  Tachycardia episodes, hyperlipidemia with very elevated triglycerides, previous leg edema,  seen in the hospital for severe hypertension and chest pain, Admission on October 1, discharge on November 14 2010. She ruled out by cardiac enzymes, chest CT was negative for PE. She had a stress test that showed no ischemia. Her medications were adjusted. CT Scan did show fatty infiltration of the liver, granulomatous lesion of the left lower lobe  In followup today, she reports that she feels well. She continues to have occasional "stinging" in her chest. This is the same as previous visits She would like to change the bystolic secondary to cost reasons. She feels better on Ranexa. No new symptoms to discuss today   She had worsening chest pain and had a cardiac catheterization 07/25/2011 This showed left dominant coronary system with moderate mid LAD, proximal diagonal #1 and proximal RCA disease all estimated at 50%, normal LV systolic function.  She does not tolerate statins. unable to tolerate vytorin.  reports that it causes restless leg. She's currently not on any cholesterol medication.  Lab work from 2014 hemoglobin A1c 7.8, total cholesterol 187, LDL 60, HDL 36, triglycerides 460 Followup lab work February 2015 showing hemoglobin A1c 6.3. No recent cholesterol panel   EKG shows sinus rhythm with rate 75 beats per minute, no significant ST or T wave changes    Outpatient Encounter Prescriptions as of 06/15/2013  Medication Sig  . aspirin 81 MG tablet Take 81 mg by mouth daily.    . cholecalciferol (VITAMIN D) 1000 UNITS tablet Take 1,000 Units by mouth daily.  [name] escitalopram (LEXAPRO) 20 MG  tablet Take 20 mg by mouth daily.   [name] esomeprazole (NEXIUM) 40 MG capsule Take 40 mg by mouth 2 (two) times daily.   [name] estradiol (ESTRACE) 1 MG tablet Take 1 mg by mouth daily.  [name] etodolac (LODINE) 300 MG capsule Take 300 mg by mouth 2 (two) times daily.   [name] gabapentin (NEURONTIN) 300 MG capsule Take 300 mg tablets 3-4 times daily.  [name] glimepiride (AMARYL) 1 MG tablet Take 2 tablets in the am & 1 tablet in the pm.  . hydrOXYzine (ATARAX/VISTARIL) 50 MG tablet Take 50 mg by mouth 2 (two) times daily.   . isosorbide mononitrate (IMDUR) 30 MG 24 hr tablet Take 30 mg by mouth as needed.  [name] levothyroxine (SYNTHROID, LEVOTHROID) 75 MCG tablet Take 75 mcg by mouth daily before breakfast.   . magnesium gluconate (MAGONATE) 500 MG tablet Take 500 mg by mouth daily.  . methocarbamol (ROBAXIN) 750 MG tablet Take 1/2 to 1 tablet three times a day.  . nebivolol (BYSTOLIC) 10 MG tablet Take 5 mg by mouth 2 (two) times daily.  . nitroGLYCERIN (NITROSTAT) 0.4 MG SL tablet Place 1 tablet (0.4 mg total) under the tongue every 5 (five) minutes as needed for chest pain.  . ranolazine (RANEXA) 500 MG 12 hr tablet Take 1 tablet (500 mg total) by mouth 2 (two) times daily.  [name] rOPINIRole (REQUIP) 4 MG tablet Take 4 mg by mouth at bedtime.   . torsemide (DEMADEX) 20 MG tablet Take one tablet every third day.  . traZODone (DESYREL) 50 MG tablet Take  150 mg by mouth at bedtime.    Review of Systems  Constitutional: Negative.   HENT: Negative.   Eyes: Negative.   Respiratory: Negative.   Cardiovascular: Negative.   Gastrointestinal: Negative.   Endocrine: Negative.   Musculoskeletal: Positive for arthralgias.  Skin: Negative.   Allergic/Immunologic: Negative.   Neurological: Negative.   Hematological: Negative.   Psychiatric/Behavioral: Negative.   All other systems reviewed and are negative.  BP 120/62  Pulse 75  Ht 5\' 3"  (1.6 m)  Wt 212 lb 8 oz (96.389 kg)  BMI 37.65 kg/m2  Physical Exam   Constitutional: She is oriented to person, place, and time. She appears well-developed and well-nourished.  HENT:  Head: Normocephalic.  Nose: Nose normal.  Mouth/Throat: Oropharynx is clear and moist.  Eyes: Conjunctivae are normal. Pupils are equal, round, and reactive to light.  Neck: Normal range of motion. Neck supple. No JVD present.  Cardiovascular: Regular rhythm, S1 normal, S2 normal and intact distal pulses.   No extrasystoles are present. Exam reveals no gallop and no friction rub.   Murmur heard.  Crescendo systolic murmur is present with a grade of 2/6  Pulmonary/Chest: Effort normal and breath sounds normal. No respiratory distress. She has no wheezes. She has no rales. She exhibits no tenderness.  Abdominal: Soft. Bowel sounds are normal. She exhibits no distension. There is no tenderness.  Obese  Musculoskeletal: Normal range of motion. She exhibits no edema and no tenderness.  Lymphadenopathy:    She has no cervical adenopathy.  Neurological: She is alert and oriented to person, place, and time. Coordination normal.  Skin: Skin is warm and dry. No rash noted. No erythema.  Psychiatric: She has a normal mood and affect. Her behavior is normal. Judgment and thought content normal.  anxious      Assessment and Plan

## 2013-06-15 NOTE — Assessment & Plan Note (Signed)
Atypical chest pain, nothing new compared to prior visits. No further workup at this time

## 2013-06-15 NOTE — Assessment & Plan Note (Signed)
Encouraged her to work on her weight, stay compliant on her CPAP

## 2013-06-15 NOTE — Assessment & Plan Note (Signed)
We have encouraged continued exercise, careful diet management in an effort to lose weight. 

## 2013-10-05 ENCOUNTER — Ambulatory Visit: Payer: Self-pay | Admitting: Internal Medicine

## 2013-10-23 ENCOUNTER — Ambulatory Visit (INDEPENDENT_AMBULATORY_CARE_PROVIDER_SITE_OTHER): Payer: Medicare Other | Admitting: Cardiovascular Disease

## 2013-10-23 ENCOUNTER — Encounter: Payer: Self-pay | Admitting: Cardiovascular Disease

## 2013-10-23 VITALS — BP 140/80 | HR 81 | Ht 63.0 in | Wt 211.2 lb

## 2013-10-23 DIAGNOSIS — R531 Weakness: Secondary | ICD-10-CM | POA: Insufficient documentation

## 2013-10-23 DIAGNOSIS — R0789 Other chest pain: Secondary | ICD-10-CM

## 2013-10-23 DIAGNOSIS — R5382 Chronic fatigue, unspecified: Secondary | ICD-10-CM

## 2013-10-23 DIAGNOSIS — E1159 Type 2 diabetes mellitus with other circulatory complications: Secondary | ICD-10-CM

## 2013-10-23 DIAGNOSIS — E785 Hyperlipidemia, unspecified: Secondary | ICD-10-CM

## 2013-10-23 DIAGNOSIS — Z9989 Dependence on other enabling machines and devices: Secondary | ICD-10-CM

## 2013-10-23 DIAGNOSIS — R5383 Other fatigue: Secondary | ICD-10-CM | POA: Insufficient documentation

## 2013-10-23 DIAGNOSIS — I10 Essential (primary) hypertension: Secondary | ICD-10-CM

## 2013-10-23 DIAGNOSIS — R5381 Other malaise: Secondary | ICD-10-CM

## 2013-10-23 DIAGNOSIS — G4733 Obstructive sleep apnea (adult) (pediatric): Secondary | ICD-10-CM

## 2013-10-23 DIAGNOSIS — I2510 Atherosclerotic heart disease of native coronary artery without angina pectoris: Secondary | ICD-10-CM

## 2013-10-23 MED ORDER — LISINOPRIL 20 MG PO TABS: 20.0000 mg | ORAL_TABLET | Freq: Every day | ORAL | Status: DC

## 2013-10-23 NOTE — Assessment & Plan Note (Signed)
She reports compliance with her CPAP, sleeping well

## 2013-10-23 NOTE — Assessment & Plan Note (Signed)
Hemoglobin A1c of 7. We have encouraged continued exercise, careful diet management in an effort to lose weight.

## 2013-10-23 NOTE — Assessment & Plan Note (Signed)
Currently with no symptoms of angina. No further workup at this time. Continue current medication regimen. 

## 2013-10-23 NOTE — Progress Notes (Signed)
Patient ID: [name], female    DOB: May 08, 1945, 68 y.o.   MRN: [id number]  HPI Comments: 68 year old woman with CAD,  cardiac catheterization in 2009 and June 2013  showing moderate LAD, diagonal and RCA disease, Obesity, diabetes, hyperlipidemia with statin intolerance, obstructive sleep apnea on CPAP,  Tachycardia episodes,  previous leg edema,  seen in the hospital for severe hypertension and chest pain, who presents for routine followup  In followup today, she reports that she has not felt well for the past few weeks. Blood pressure has been running high, she reports having significant lethargy. She's unable to do much before needing to lay down and take a nap. She denies any significant chest pain. She has been having some back pain. She is concerned most about her lethargy and lack of energy to do anything. She reports that she is sleeping well, using her CPAP.  Recent blood work showing total cholesterol 238, triglycerides 462, hemoglobin A1c 7.0 She was unable to tolerate Vytorin as well as other statins   Admission on October 1, discharge on November 14 2010. She ruled out by cardiac enzymes, chest CT was negative for PE. She had a stress test that showed no ischemia. Her medications were adjusted. CT Scan did show fatty infiltration of the liver, granulomatous lesion of the left lower lobe  cardiac catheterization 07/25/2011 for chest pain   This showed left dominant coronary system with moderate mid LAD, proximal diagonal #1 and proximal RCA disease all estimated at 50%, normal LV systolic function.  EKG shows sinus rhythm with rate 81 beats per minute, no significant ST or T wave changes    Outpatient Encounter Prescriptions as of 10/23/2013  Medication Sig  . aspirin 81 MG tablet Take 81 mg by mouth daily.    . carvedilol (COREG) 6.25 MG tablet Take 6.25 mg (3 tablets) in the am & 6.25 mg (2 tablets) in the pm.  . cholecalciferol (VITAMIN D) 1000 UNITS tablet Take 1,000 Units  by mouth daily.  [name] escitalopram (LEXAPRO) 20 MG tablet Take 20 mg by mouth daily.   [name] esomeprazole (NEXIUM) 40 MG capsule Take 40 mg by mouth 2 (two) times daily.   [name] estrogen-methylTESTOSTERone 0.625-1.25 MG per tablet Take 1 tablet by mouth daily.  [name] etodolac (LODINE) 300 MG capsule Take 300 mg by mouth 2 (two) times daily.   [name] gabapentin (NEURONTIN) 300 MG capsule Take 300 mg tablets 3-4 times daily.  [name] glimepiride (AMARYL) 1 MG tablet Take 2 tablets in the am & 1 tablet in the pm.  . hydrOXYzine (ATARAX/VISTARIL) 50 MG tablet Take 50 mg by mouth 2 (two) times daily.   . isosorbide mononitrate (IMDUR) 30 MG 24 hr tablet Take 30 mg by mouth as needed.  [name] levothyroxine (SYNTHROID, LEVOTHROID) 75 MCG tablet Take 75 mcg by mouth daily before breakfast.   . magnesium gluconate (MAGONATE) 500 MG tablet Take 500 mg by mouth daily.  . methocarbamol (ROBAXIN) 750 MG tablet Take 1/2 to 1 tablet three times a day.  . nitroGLYCERIN (NITROSTAT) 0.4 MG SL tablet Place 1 tablet (0.4 mg total) under the tongue every 5 (five) minutes as needed for chest pain.  . ranolazine (RANEXA) 1000 MG SR tablet Take 1 tablet (1,000 mg total) by mouth 2 (two) times daily.  [name] rOPINIRole (REQUIP) 4 MG tablet Take 4 mg by mouth at bedtime.   . torsemide (DEMADEX) 20 MG tablet Take one tablet every third day.  . traZODone (DESYREL) 150  MG tablet Take by mouth at bedtime.  . [DISCONTINUED] carvedilol (COREG) 6.25 MG tablet Take 1 tablet (6.25 mg total) by mouth 2 (two) times daily.  [name] lisinopril (PRINIVIL,ZESTRIL) 20 MG tablet Take 1 tablet (20 mg total) by mouth daily.    Review of Systems  Constitutional: Positive for fatigue.  HENT: Negative.   Eyes: Negative.   Respiratory: Negative.   Cardiovascular: Negative.   Gastrointestinal: Negative.   Endocrine: Negative.   Musculoskeletal: Positive for arthralgias and back pain.  Skin: Negative.   Allergic/Immunologic: Negative.   Neurological: Positive for weakness.   Hematological: Negative.   Psychiatric/Behavioral: Negative.   All other systems reviewed and are negative.  BP 180/90  Pulse 81  Ht 5\' 3"  (1.6 m)  Wt 211 lb 4 oz (95.822 kg)  BMI 37.43 kg/m2 Repeat blood pressure 140/80 Physical Exam  Constitutional: She is oriented to person, place, and time. She appears well-developed and well-nourished.  HENT:  Head: Normocephalic.  Nose: Nose normal.  Mouth/Throat: Oropharynx is clear and moist.  Eyes: Conjunctivae are normal. Pupils are equal, round, and reactive to light.  Neck: Normal range of motion. Neck supple. No JVD present.  Cardiovascular: Regular rhythm, S1 normal, S2 normal and intact distal pulses.   No extrasystoles are present. Exam reveals no gallop and no friction rub.   Murmur heard.  Crescendo systolic murmur is present with a grade of 2/6  Pulmonary/Chest: Effort normal and breath sounds normal. No respiratory distress. She has no wheezes. She has no rales. She exhibits no tenderness.  Abdominal: Soft. Bowel sounds are normal. She exhibits no distension. There is no tenderness.  Obese  Musculoskeletal: Normal range of motion. She exhibits no edema and no tenderness.  Lymphadenopathy:    She has no cervical adenopathy.  Neurological: She is alert and oriented to person, place, and time. Coordination normal.  Skin: Skin is warm and dry. No rash noted. No erythema.  Psychiatric: She has a normal mood and affect. Her behavior is normal. Judgment and thought content normal.  anxious      Assessment and Plan

## 2013-10-23 NOTE — Patient Instructions (Signed)
You are doing well. For high blood pressure Start 1/2 pill of the lisinopril daily Monitor your blood pressure  Call the office next week to let us know how you feel  Please call us if you have new issues that need to be addressed before your next appt.  Your physician wants you to follow-up in: 3 months.  You will receive a reminder letter in the mail two months in advance. If you don't receive a letter, please call our office to schedule the follow-up appointment.

## 2013-10-23 NOTE — Assessment & Plan Note (Addendum)
Etiology of her fatigue is unclear. Less likely angina. We will work on her blood pressure and have suggested she call us next week. She is sleeping well. Unable to exclude depression. She denies any significant stress she's not doing any regular exercise program, weight continues to be a problem. I suspect she is very deconditioned

## 2013-10-23 NOTE — Assessment & Plan Note (Signed)
We suggested she research starting praluent. She has been unable to tolerate any statins, including vytorin. She has known coronary artery disease.

## 2013-10-23 NOTE — Assessment & Plan Note (Signed)
She reports blood pressure has been running high. We will start lisinopril 10 mg daily with titration up to 20 minutes daily if needed

## 2013-10-30 ENCOUNTER — Ambulatory Visit (INDEPENDENT_AMBULATORY_CARE_PROVIDER_SITE_OTHER): Payer: Medicare Other

## 2013-10-30 DIAGNOSIS — E785 Hyperlipidemia, unspecified: Secondary | ICD-10-CM

## 2013-10-30 MED ORDER — ALIROCUMAB 75 MG/ML ~~LOC~~ SOPN: 1.0000 "pen " | PEN_INJECTOR | SUBCUTANEOUS | Status: DC

## 2013-10-30 NOTE — Patient Instructions (Signed)
Please return for labs in 6 weeks:  Lipid & liver profile

## 2013-10-30 NOTE — Progress Notes (Signed)
1.) Reason for visit: Praluent education and injection  2.) Name of MD requesting visit: Dr. Rockey Situ  3.) H&P: Per Dr. Donivan Scull recommendation at [facility] 10/23/13, pt was given educational material on Praluent.   She verbalizes understanding and is eager to proceed w/ medication.  Pt demonstrates proper technique on practice injection and would like to proceed w/ administering herself the initial injection under my supervision.  4.) ROS related to problem:  Pt administered Praluent 75mg /ml prefilled pen (Lot # [id number], Exp 01/2014) into her right thigh.  Pt demonstrated proper technique and tolerated procedure well.  Pt remained in lab for observation for 5 minutes after injection w/ no problems.    5.) Assessment and plan per MD:  Pt to return for lipid & liver profiles in 6 weeks.

## 2013-11-03 DIAGNOSIS — M48062 Spinal stenosis, lumbar region with neurogenic claudication: Secondary | ICD-10-CM | POA: Insufficient documentation

## 2013-11-09 ENCOUNTER — Telehealth: Payer: Self-pay | Admitting: *Deleted

## 2013-11-09 NOTE — Telephone Encounter (Signed)
Please call [facility] regarding order for Medication Praluent 75 prefill pen. Needs to talk to you regarding delivery date.

## 2013-11-09 NOTE — Telephone Encounter (Signed)
Spoke w/ [facility].  She reports that they did not have a phone # for pt and are requesting this info.

## 2013-11-10 ENCOUNTER — Ambulatory Visit: Payer: Self-pay | Admitting: Physical Medicine and Rehabilitation

## 2013-11-20 ENCOUNTER — Ambulatory Visit: Payer: Medicare Other | Admitting: Cardiovascular Disease

## 2013-12-08 ENCOUNTER — Telehealth: Payer: Self-pay

## 2013-12-08 NOTE — Telephone Encounter (Signed)
Received fax that pt is approved for Patient Assistance Program for [place].

## 2014-02-09 ENCOUNTER — Telehealth: Payer: Self-pay | Admitting: *Deleted

## 2014-02-09 NOTE — Telephone Encounter (Signed)
Spoke w/ pt.  Offered her appt to see Christell Faith, PA tomorrow.  She would like to wait to see Dr. Rockey Situ, as she does not feel that her sx are urgent.  She is sched to see Dr.Gollan 02/24/14 @ 7:45. She reports that her ins ended and she is now has [employer] coverage. Advised her that we can provide her with Praluent samples until her coverage for this is determined.  Asked her to call back w/ any questions or concerns.

## 2014-02-09 NOTE — Telephone Encounter (Signed)
Patient c/o Palpitations:  High priority if patient c/o lightheadedness and shortness of breath.  1. How long have you been having palpitations? A couple months.  Last night was bad. Heart rate up to 125.  2. Are you currently experiencing lightheadedness and shortness of breath? Yes both  3. Have you checked your BP and heart rate? (document readings) Hasn't taken bp Heart rate 125.  4. Are you experiencing any other symptoms? Having Angina in left arm.

## 2014-02-16 ENCOUNTER — Other Ambulatory Visit: Payer: Self-pay | Admitting: *Deleted

## 2014-02-16 ENCOUNTER — Telehealth: Payer: Self-pay | Admitting: *Deleted

## 2014-02-16 MED ORDER — LISINOPRIL 20 MG PO TABS: 20.0000 mg | ORAL_TABLET | Freq: Every day | ORAL | Status: DC

## 2014-02-16 NOTE — Telephone Encounter (Signed)
Refilled Lisinopril 90 day supply sent to [facility]. #90 R# 3  Could not refill Nitroglycerin for 90 day supply b/c it would expire. Praluent goes through specialty pharmacy and would not be refilled by [facility].

## 2014-02-16 NOTE — Telephone Encounter (Signed)
LMTCB concerning Rx refill request from [facility].

## 2014-02-19 ENCOUNTER — Other Ambulatory Visit: Payer: Self-pay

## 2014-02-19 MED ORDER — NITROGLYCERIN 0.4 MG SL SUBL: 0.4000 mg | SUBLINGUAL_TABLET | SUBLINGUAL | Status: DC | PRN

## 2014-02-19 MED ORDER — ALIROCUMAB 75 MG/ML ~~LOC~~ SOPN: 1.0000 "pen " | PEN_INJECTOR | SUBCUTANEOUS | Status: DC

## 2014-02-19 NOTE — Telephone Encounter (Signed)
Refill sent for 90 day supply for praluent autho inj pen

## 2014-02-22 NOTE — Telephone Encounter (Signed)
This encounter was created in error - please disregard.

## 2014-02-23 ENCOUNTER — Telehealth: Payer: Self-pay | Admitting: *Deleted

## 2014-02-23 NOTE — Telephone Encounter (Signed)
Patient coming tomorrow morning 02/24/14 @ 7:45 am. Please have a samples of Praluent 75mg .

## 2014-02-24 ENCOUNTER — Encounter: Payer: Self-pay | Admitting: Cardiovascular Disease

## 2014-02-24 ENCOUNTER — Ambulatory Visit (INDEPENDENT_AMBULATORY_CARE_PROVIDER_SITE_OTHER): Payer: Medicare Other | Admitting: Cardiovascular Disease

## 2014-02-24 ENCOUNTER — Telehealth: Payer: Self-pay | Admitting: Cardiovascular Disease

## 2014-02-24 VITALS — BP 124/64 | HR 84 | Ht 63.0 in | Wt 200.8 lb

## 2014-02-24 DIAGNOSIS — F419 Anxiety disorder, unspecified: Secondary | ICD-10-CM

## 2014-02-24 DIAGNOSIS — G4733 Obstructive sleep apnea (adult) (pediatric): Secondary | ICD-10-CM

## 2014-02-24 DIAGNOSIS — E785 Hyperlipidemia, unspecified: Secondary | ICD-10-CM

## 2014-02-24 DIAGNOSIS — R0789 Other chest pain: Secondary | ICD-10-CM

## 2014-02-24 DIAGNOSIS — M79602 Pain in left arm: Secondary | ICD-10-CM

## 2014-02-24 DIAGNOSIS — Z9989 Dependence on other enabling machines and devices: Secondary | ICD-10-CM

## 2014-02-24 DIAGNOSIS — I2510 Atherosclerotic heart disease of native coronary artery without angina pectoris: Secondary | ICD-10-CM

## 2014-02-24 DIAGNOSIS — L299 Pruritus, unspecified: Secondary | ICD-10-CM

## 2014-02-24 DIAGNOSIS — E1159 Type 2 diabetes mellitus with other circulatory complications: Secondary | ICD-10-CM

## 2014-02-24 DIAGNOSIS — I10 Essential (primary) hypertension: Secondary | ICD-10-CM

## 2014-02-24 DIAGNOSIS — R000 Tachycardia, unspecified: Secondary | ICD-10-CM

## 2014-02-24 MED ORDER — PROPRANOLOL HCL 20 MG PO TABS: 20.0000 mg | ORAL_TABLET | Freq: Three times a day (TID) | ORAL | Status: DC | PRN

## 2014-02-24 MED ORDER — NITROGLYCERIN 0.4 MG SL SUBL: 0.4000 mg | SUBLINGUAL_TABLET | SUBLINGUAL | Status: DC | PRN

## 2014-02-24 NOTE — Assessment & Plan Note (Signed)
Periodic episodes of tachycardia likely brought on by anxiety and stress. We have given her a prescription for propranolol to take when necessary

## 2014-02-24 NOTE — Assessment & Plan Note (Signed)
Currently with no symptoms of angina. No further workup at this time. Continue current medication regimen. 

## 2014-02-24 NOTE — Telephone Encounter (Signed)
Samples given to patient at office visit with Dr. Rockey Situ today.

## 2014-02-24 NOTE — Telephone Encounter (Signed)
[facility] calling stating they received a refill on a few medications but they need praluent.  They have a speciality department that takes care of it and was not sure why it would not be sent to them.  Please advise.

## 2014-02-24 NOTE — Assessment & Plan Note (Signed)
She has a long history of primary care, sometimes relieved with nitroglycerin. Symptoms have resolved as her stress has improved. No further workup at this time

## 2014-02-24 NOTE — Patient Instructions (Signed)
You are doing well.  Please take propranolol as needed for palpitations, tachycardia  We will check you labs today  Please call us if you have new issues that need to be addressed before your next appt.  Your physician wants you to follow-up in: 6 months.  You will receive a reminder letter in the mail two months in advance. If you don't receive a letter, please call our office to schedule the follow-up appointment.

## 2014-02-24 NOTE — Progress Notes (Signed)
Patient ID: [name], female    DOB: December 16, 1945, 69 y.o.   MRN: [id number]  HPI Comments: 69 year old woman with CAD,  cardiac catheterization in 2009 and June 2013  showing moderate LAD, diagonal and RCA disease, Obesity, diabetes, hyperlipidemia with statin intolerance, obstructive sleep apnea on CPAP,  Tachycardia episodes,  previous leg edema,  seen in the hospital for severe hypertension and chest pain, who presents for routine followup of her coronary artery disease  In follow-up today, she reports that she is doing relatively well. Weight is down 11 pounds from her prior clinic visit. She has been on praluent and received 8 doses. Presenting today for discussion on her medications and lab work. She does report having rare episodes of left arm pain. Symptoms typically relieved with nitroglycerin. She had episodes over the holiday during which time she was very stressed out with family issues. Also reports waking up at times with tachycardia. Again possibly from anxiety. She is watching her diet, very pleased with her weight loss She is having profound itching on her upper back which she attributes to anxiety  EKG shows normal sinus rhythm with rate 84 bpm, no significant ST or T-wave changes  Other past medical history  using her CPAP. Previous blood work showing total cholesterol 238, triglycerides 462, hemoglobin A1c 7.0 She was unable to tolerate Vytorin as well as other statins   Admission on October 1, discharge on November 14 2010. She ruled out by cardiac enzymes, chest CT was negative for PE. She had a stress test that showed no ischemia. Her medications were adjusted. CT Scan did show fatty infiltration of the liver, granulomatous lesion of the left lower lobe  cardiac catheterization 07/25/2011 for chest pain   This showed left dominant coronary system with moderate mid LAD, proximal diagonal #1 and proximal RCA disease all estimated at 50%, normal LV systolic  function.   Allergies  Allergen Reactions  . Codeine     rash  . Penicillins     rash  . Vicodin [Hydrocodone-Acetaminophen]     Rash     Outpatient Encounter Prescriptions as of 69/13/2016  Medication Sig  . Alirocumab (PRALUENT) 75 MG/ML SOPN Inject 1 pen into the skin every 14 (fourteen) days.  [name] aspirin 81 MG tablet Take 81 mg by mouth daily.    . cholecalciferol (VITAMIN D) 1000 UNITS tablet Take 1,000 Units by mouth daily.  [name] escitalopram (LEXAPRO) 20 MG tablet Take 20 mg by mouth daily.   [name] esomeprazole (NEXIUM) 40 MG capsule Take 40 mg by mouth 2 (two) times daily.   [name] estrogen-methylTESTOSTERone 0.625-1.25 MG per tablet Take 1 tablet by mouth daily.  [name] etodolac (LODINE) 300 MG capsule Take 300 mg by mouth 2 (two) times daily.   [name] gabapentin (NEURONTIN) 300 MG capsule Take 300 mg tablets 3-4 times daily.  [name] glimepiride (AMARYL) 1 MG tablet Take 2 tablets in the am & 1 tablet in the pm.  . hydrOXYzine (ATARAX/VISTARIL) 50 MG tablet Take 50 mg by mouth 2 (two) times daily.   . isosorbide mononitrate (IMDUR) 30 MG 24 hr tablet Take 30 mg by mouth as needed.  [name] levothyroxine (SYNTHROID, LEVOTHROID) 75 MCG tablet Take 75 mcg by mouth daily before breakfast.   . lisinopril (PRINIVIL,ZESTRIL) 20 MG tablet Take 1 tablet (20 mg total) by mouth daily.  . methocarbamol (ROBAXIN) 750 MG tablet Take 1/2 to 1 tablet three times a day.  . nitroGLYCERIN (NITROSTAT) 0.4 MG SL tablet Place  1 tablet (0.4 mg total) under the tongue every 5 (five) minutes as needed for chest pain.  [name] rOPINIRole (REQUIP) 4 MG tablet Take 4 mg by mouth at bedtime.   . torsemide (DEMADEX) 20 MG tablet Take one tablet every third day.  . traZODone (DESYREL) 150 MG tablet Take by mouth at bedtime.  . [DISCONTINUED] nitroGLYCERIN (NITROSTAT) 0.4 MG SL tablet Place 1 tablet (0.4 mg total) under the tongue every 5 (five) minutes as needed for chest pain.  [name] propranolol (INDERAL) 20 MG tablet Take 1 tablet (20 mg total)  by mouth 3 (three) times daily as needed.  . [DISCONTINUED] carvedilol (COREG) 6.25 MG tablet Take 6.25 mg (3 tablets) in the am & 6.25 mg (2 tablets) in the pm.  . [DISCONTINUED] magnesium gluconate (MAGONATE) 500 MG tablet Take 500 mg by mouth daily.  . [DISCONTINUED] ranolazine (RANEXA) 1000 MG SR tablet Take 1 tablet (1,000 mg total) by mouth 2 (two) times daily. (Patient not taking: Reported on 02/24/2014)    Past Medical History  Diagnosis Date  . Hypertension   . Diabetes mellitus   . Hyperlipidemia   . Chest pain   . Obesity   . Gallstones   . OSA on CPAP   . Sleep apnea   . GERD (gastroesophageal reflux disease)   . Depression   . Thyroid disease     Past Surgical History  Procedure Laterality Date  . Abdominal hysterectomy    . Tonsillectomy    . Cholecystectomy    . Cardiac catheterization      Normal  . Cataract extraction, bilateral    . Implantable contact lens implantation      bilateral    Social History  reports that she has never smoked. She has never used smokeless tobacco. She reports that she does not drink alcohol or use illicit drugs.  Family History family history includes Heart attack in her sister; Heart disease in her brother, father, mother, and sister.   Review of Systems  Constitutional: Positive for unexpected weight change.  HENT: Negative.   Eyes: Negative.   Respiratory: Negative.   Cardiovascular: Positive for chest pain and palpitations.  Gastrointestinal: Negative.   Endocrine: Negative.   Musculoskeletal: Positive for back pain and arthralgias.  Skin: Positive for rash.       Itching on her upper back bilaterally  Allergic/Immunologic: Negative.   Neurological: Negative.   Hematological: Negative.   Psychiatric/Behavioral: Negative.   All other systems reviewed and are negative.  BP 124/64 mmHg  Pulse 84  Ht 5\' 3"  (1.6 m)  Wt 200 lb 12 oz (91.06 kg)  BMI 35.57 kg/m2  Physical Exam  Constitutional: She is oriented to  person, place, and time. She appears well-developed and well-nourished.  HENT:  Head: Normocephalic.  Nose: Nose normal.  Mouth/Throat: Oropharynx is clear and moist.  Eyes: Conjunctivae are normal. Pupils are equal, round, and reactive to light.  Neck: Normal range of motion. Neck supple. No JVD present.  Cardiovascular: Normal rate, regular rhythm, S1 normal, S2 normal and intact distal pulses.   No extrasystoles are present. Exam reveals no gallop and no friction rub.   Murmur heard.  Crescendo systolic murmur is present with a grade of 2/6  Pulmonary/Chest: Effort normal and breath sounds normal. No respiratory distress. She has no wheezes. She has no rales. She exhibits no tenderness.  Abdominal: Soft. Bowel sounds are normal. She exhibits no distension. There is no tenderness.  Obese  Musculoskeletal: Normal range of  motion. She exhibits no edema or tenderness.  Lymphadenopathy:    She has no cervical adenopathy.  Neurological: She is alert and oriented to person, place, and time. Coordination normal.  Skin: Skin is warm and dry. No rash noted. No erythema.  Welts on her upper back bilaterally from itching  Psychiatric: She has a normal mood and affect. Her behavior is normal. Judgment and thought content normal.  anxious  Nursing note and vitals reviewed.     Assessment and Plan

## 2014-02-24 NOTE — Telephone Encounter (Signed)
See note about Praluent request?

## 2014-02-24 NOTE — Assessment & Plan Note (Signed)
We have encouraged continued exercise, careful diet management in an effort to lose weight. 

## 2014-02-24 NOTE — Assessment & Plan Note (Signed)
Significant itching of her upper back. Also possibly from picking, anxiety as she calls it. While we were talking, she was observed scratching and picking. Recommended she talk with Dr. Sabra Heck. Uncertain if various creams might help as there is no other areas on her body with similar findings.

## 2014-02-24 NOTE — Assessment & Plan Note (Signed)
Tolerating her Cpap. stable.

## 2014-02-24 NOTE — Assessment & Plan Note (Signed)
Blood pressure is well controlled on today's visit. No changes made to the medications. 

## 2014-02-24 NOTE — Assessment & Plan Note (Signed)
She reports recent family troubles over the holidays causing tachycardia, episodes of left arm pain for which she took nitroglycerin. Recommended she talk with primary care.

## 2014-02-24 NOTE — Telephone Encounter (Signed)
Pt recently changed ins co.  [facility]ney handles our praluent rxs.

## 2014-02-25 LAB — HEPATIC FUNCTION PANEL
ALT: 17 IU/L (ref 0–32)
AST: 11 IU/L (ref 0–40)
Albumin: 4.3 g/dL (ref 3.6–4.8)
Alkaline Phosphatase: 68 IU/L (ref 39–117)
Bilirubin, Direct: 0.09 mg/dL (ref 0.00–0.40)
Total Bilirubin: 0.2 mg/dL (ref 0.0–1.2)
Total Protein: 6.7 g/dL (ref 6.0–8.5)

## 2014-02-25 LAB — LIPID PANEL
CHOLESTEROL TOTAL: 230 mg/dL — AB (ref 100–199)
Chol/HDL Ratio: 6.1 ratio units — ABNORMAL HIGH (ref 0.0–4.4)
HDL: 38 mg/dL — AB (ref >39)
TRIGLYCERIDES: 410 mg/dL — AB (ref 0–149)

## 2014-02-26 ENCOUNTER — Other Ambulatory Visit: Payer: Self-pay

## 2014-02-26 DIAGNOSIS — E785 Hyperlipidemia, unspecified: Secondary | ICD-10-CM

## 2014-03-01 ENCOUNTER — Other Ambulatory Visit: Payer: Self-pay

## 2014-03-01 MED ORDER — ALIROCUMAB 75 MG/ML ~~LOC~~ SOPN: 1.0000 "pen " | PEN_INJECTOR | SUBCUTANEOUS | Status: DC

## 2014-03-01 NOTE — Telephone Encounter (Signed)
Refill sent for praluent auto-inj pen

## 2014-04-09 ENCOUNTER — Other Ambulatory Visit (INDEPENDENT_AMBULATORY_CARE_PROVIDER_SITE_OTHER): Payer: Medicare Other | Admitting: *Deleted

## 2014-04-09 DIAGNOSIS — E785 Hyperlipidemia, unspecified: Secondary | ICD-10-CM

## 2014-04-10 LAB — HEPATIC FUNCTION PANEL
ALBUMIN: 4.1 g/dL (ref 3.6–4.8)
ALT: 13 IU/L (ref 0–32)
AST: 11 IU/L (ref 0–40)
Alkaline Phosphatase: 72 IU/L (ref 39–117)
Bilirubin Total: <0.2 mg/dL (ref 0.0–1.2)
Bilirubin, Direct: 0.07 mg/dL (ref 0.00–0.40)
Total Protein: 6.3 g/dL (ref 6.0–8.5)

## 2014-04-10 LAB — LIPID PANEL
Chol/HDL Ratio: 6.1 ratio units — ABNORMAL HIGH (ref 0.0–4.4)
Cholesterol, Total: 202 mg/dL — ABNORMAL HIGH (ref 100–199)
HDL: 33 mg/dL — ABNORMAL LOW (ref >39)
LDL CALC: 109 mg/dL — AB (ref 0–99)
TRIGLYCERIDES: 299 mg/dL — AB (ref 0–149)
VLDL Cholesterol Cal: 60 mg/dL — ABNORMAL HIGH (ref 5–40)

## 2014-04-22 ENCOUNTER — Other Ambulatory Visit: Payer: Self-pay | Admitting: *Deleted

## 2014-04-22 MED ORDER — PROPRANOLOL HCL 20 MG PO TABS: 20.0000 mg | ORAL_TABLET | Freq: Three times a day (TID) | ORAL | Status: DC | PRN

## 2014-04-23 ENCOUNTER — Other Ambulatory Visit: Payer: Self-pay

## 2014-04-23 MED ORDER — ALIROCUMAB 150 MG/ML ~~LOC~~ SOPN: 1.0000 "pen " | PEN_INJECTOR | SUBCUTANEOUS | Status: DC

## 2014-04-28 ENCOUNTER — Ambulatory Visit: Payer: Self-pay | Admitting: Physical Medicine and Rehabilitation

## 2014-05-21 ENCOUNTER — Other Ambulatory Visit: Payer: Self-pay

## 2014-05-21 MED ORDER — ALIROCUMAB 150 MG/ML ~~LOC~~ SOPN: 1.0000 "pen " | PEN_INJECTOR | SUBCUTANEOUS | Status: DC

## 2014-05-21 NOTE — Telephone Encounter (Signed)
This encounter was created in error - please disregard.

## 2014-05-24 NOTE — Telephone Encounter (Signed)
Pt has been approved for Praluent Pen Injctr.until 05/06/2015.

## 2014-08-02 ENCOUNTER — Other Ambulatory Visit: Payer: Self-pay

## 2014-08-02 ENCOUNTER — Telehealth: Payer: Self-pay

## 2014-08-02 MED ORDER — ALIROCUMAB 150 MG/ML ~~LOC~~ SOPN: 1.0000 "pen " | PEN_INJECTOR | SUBCUTANEOUS | Status: DC

## 2014-08-02 NOTE — Telephone Encounter (Signed)
03/21/22.

## 2014-08-02 NOTE — Telephone Encounter (Signed)
Need  ICD-9 for Praluent.

## 2014-08-02 NOTE — Telephone Encounter (Signed)
This encounter was created in error - please disregard.

## 2014-09-07 ENCOUNTER — Ambulatory Visit (INDEPENDENT_AMBULATORY_CARE_PROVIDER_SITE_OTHER): Payer: Medicare Other | Admitting: Cardiovascular Disease

## 2014-09-07 ENCOUNTER — Encounter: Payer: Self-pay | Admitting: Cardiovascular Disease

## 2014-09-07 VITALS — BP 122/70 | HR 94 | Ht 63.5 in | Wt 210.5 lb

## 2014-09-07 DIAGNOSIS — I10 Essential (primary) hypertension: Secondary | ICD-10-CM | POA: Diagnosis not present

## 2014-09-07 DIAGNOSIS — R000 Tachycardia, unspecified: Secondary | ICD-10-CM | POA: Diagnosis not present

## 2014-09-07 DIAGNOSIS — E785 Hyperlipidemia, unspecified: Secondary | ICD-10-CM

## 2014-09-07 DIAGNOSIS — E1159 Type 2 diabetes mellitus with other circulatory complications: Secondary | ICD-10-CM | POA: Diagnosis not present

## 2014-09-07 DIAGNOSIS — I2510 Atherosclerotic heart disease of native coronary artery without angina pectoris: Secondary | ICD-10-CM | POA: Diagnosis not present

## 2014-09-07 MED ORDER — ALIROCUMAB 150 MG/ML ~~LOC~~ SOPN: 1.0000 "pen " | PEN_INJECTOR | SUBCUTANEOUS | Status: DC

## 2014-09-07 NOTE — Assessment & Plan Note (Signed)
We have encouraged continued exercise, careful diet management in an effort to lose weight. 

## 2014-09-07 NOTE — Progress Notes (Signed)
Patient ID: [name], female    DOB: [birth year], 69 y.o.   MRN: [id number]  HPI Comments: 69 year old woman with CAD, cardiac catheterization in 2009 and June 2013  showing moderate LAD, diagonal and RCA disease, Obesity, diabetes, hyperlipidemia with statin intolerance, obstructive sleep apnea on CPAP,  Tachycardia episodes,  previous leg edema,  seen in the hospital for severe hypertension and chest pain, who presents for routine followup of her coronary artery disease  In follow-up today, weight has trended back upwards She continues on praluent 150 mg every 14 days. On this regimen, her total cholesterol is 169, LDL 69. This is a dramatic improvement from prior numbers She reports her diabetes numbers continue to trend upwards, hemoglobin A1c greater than 8 Continues to have profound itching which she attributes to anxiety She did report having palpitations at nighttime. She was given propranolol but has not started this yet  EKG shows normal sinus rhythm with rate 94 bpm, no significant ST or T-wave changes  Other past medical history  using her CPAP. Previous blood work showing total cholesterol 238, triglycerides 462, hemoglobin A1c 7.0 She was unable to tolerate Vytorin as well as other statins   Admission on October 1, discharge on November 14 2010. She ruled out by cardiac enzymes, chest CT was negative for PE. She had a stress test that showed no ischemia. Her medications were adjusted. CT Scan did show fatty infiltration of the liver, granulomatous lesion of the left lower lobe  cardiac catheterization 07/25/2011 for chest pain   This showed left dominant coronary system with moderate mid LAD, proximal diagonal #1 and proximal RCA disease all estimated at 50%, normal LV systolic function.   Allergies  Allergen Reactions  . Codeine     rash  . Penicillins     rash  . Vicodin [Hydrocodone-Acetaminophen]     Rash     Outpatient Encounter Prescriptions as of 09/07/2014   Medication Sig  . Alirocumab (PRALUENT) 150 MG/ML SOPN Inject 1 pen into the skin every 14 (fourteen) days.  [name] aspirin 81 MG tablet Take 81 mg by mouth daily.    . cholecalciferol (VITAMIN D) 1000 UNITS tablet Take 1,000 Units by mouth daily.  [name] doxycycline (VIBRA-TABS) 100 MG tablet Take 100 mg by mouth daily.  [name] escitalopram (LEXAPRO) 20 MG tablet Take 20 mg by mouth daily.   [name] esomeprazole (NEXIUM) 40 MG capsule Take 40 mg by mouth 2 (two) times daily.   [name] estrogen-methylTESTOSTERone 0.625-1.25 MG per tablet Take 1 tablet by mouth daily.  [name] etodolac (LODINE) 300 MG capsule Take 300 mg by mouth 2 (two) times daily.   [name] gabapentin (NEURONTIN) 300 MG capsule Take 300 mg tablets 3-4 times daily.  [name] glimepiride (AMARYL) 1 MG tablet Take 2 tablets in the am & 1 tablet in the pm.  . hydrOXYzine (ATARAX/VISTARIL) 50 MG tablet Take 50 mg by mouth 2 (two) times daily.   [name] levothyroxine (SYNTHROID, LEVOTHROID) 75 MCG tablet Take 75 mcg by mouth daily before breakfast.   . lisinopril (PRINIVIL,ZESTRIL) 20 MG tablet Take 1 tablet (20 mg total) by mouth daily.  . methocarbamol (ROBAXIN) 750 MG tablet Take 1/2 to 1 tablet three times a day as needed.  . nitroGLYCERIN (NITROSTAT) 0.4 MG SL tablet Place 1 tablet (0.4 mg total) under the tongue every 5 (five) minutes as needed for chest pain.  [name] propranolol (INDERAL) 20 MG tablet Take 1 tablet (20 mg total) by mouth 3 (three) times daily  as needed.  [name] rOPINIRole (REQUIP) 4 MG tablet Take 4 mg by mouth at bedtime.   . sitaGLIPtin (JANUVIA) 100 MG tablet Take 100 mg by mouth daily.  [name] TEMAZEPAM PO Take by mouth daily.  [name] torsemide (DEMADEX) 20 MG tablet Take one tablet every third day.  . [DISCONTINUED] Alirocumab (PRALUENT) 150 MG/ML SOPN Inject 1 pen into the skin every 14 (fourteen) days.  . isosorbide mononitrate (IMDUR) 30 MG 24 hr tablet Take 30 mg by mouth as needed.  . [DISCONTINUED] traZODone (DESYREL) 150 MG tablet Take by mouth at bedtime.   No  facility-administered encounter medications on file as of 09/07/2014.    Past Medical History  Diagnosis Date  . Hypertension   . Diabetes mellitus   . Hyperlipidemia   . Chest pain   . Obesity   . Gallstones   . OSA on CPAP   . Sleep apnea   . GERD (gastroesophageal reflux disease)   . Depression   . Thyroid disease   . Dermatitis     Past Surgical History  Procedure Laterality Date  . Abdominal hysterectomy    . Tonsillectomy    . Cholecystectomy    . Cardiac catheterization      Normal  . Cataract extraction, bilateral    . Implantable contact lens implantation      bilateral    Social History  reports that she has never smoked. She has never used smokeless tobacco. She reports that she does not drink alcohol or use illicit drugs.  Family History family history includes Heart attack in her sister; Heart disease in her brother, father, mother, and sister.  Review of Systems  Constitutional: Negative.   Respiratory: Negative.   Cardiovascular: Positive for palpitations.  Gastrointestinal: Negative.   Musculoskeletal: Positive for back pain and arthralgias.  Skin: Positive for rash.       Itching on her upper back bilaterally  Allergic/Immunologic: Negative.   Neurological: Negative.   Hematological: Negative.   Psychiatric/Behavioral: Negative.   All other systems reviewed and are negative.  BP 122/70 mmHg  Pulse 94  Ht 5' 3.5" (1.613 m)  Wt 210 lb 8 oz (95.482 kg)  BMI 36.70 kg/m2  Physical Exam  Constitutional: She is oriented to person, place, and time. She appears well-developed and well-nourished.  HENT:  Head: Normocephalic.  Nose: Nose normal.  Mouth/Throat: Oropharynx is clear and moist.  Eyes: Conjunctivae are normal. Pupils are equal, round, and reactive to light.  Neck: Normal range of motion. Neck supple. No JVD present.  Cardiovascular: Normal rate, regular rhythm, S1 normal, S2 normal and intact distal pulses.   No extrasystoles are  present. Exam reveals no gallop and no friction rub.   Murmur heard.  Crescendo systolic murmur is present with a grade of 2/6  Pulmonary/Chest: Effort normal and breath sounds normal. No respiratory distress. She has no wheezes. She has no rales. She exhibits no tenderness.  Abdominal: Soft. Bowel sounds are normal. She exhibits no distension. There is no tenderness.  Obese  Musculoskeletal: Normal range of motion. She exhibits no edema or tenderness.  Lymphadenopathy:    She has no cervical adenopathy.  Neurological: She is alert and oriented to person, place, and time. Coordination normal.  Skin: Skin is warm and dry. No rash noted. No erythema.  Welts on her upper back bilaterally from itching  Psychiatric: She has a normal mood and affect. Her behavior is normal. Judgment and thought content normal.  anxious  Nursing note and  vitals reviewed.     Assessment and Plan

## 2014-09-07 NOTE — Assessment & Plan Note (Signed)
Numbers have dramatically improved on praluent.  We'll continue on 150 mg every 2 weeks. New prescription sent in today

## 2014-09-07 NOTE — Patient Instructions (Signed)
You are doing well. No medication changes were made.  Please call us if you have new issues that need to be addressed before your next appt.  Your physician wants you to follow-up in: 6 months.  You will receive a reminder letter in the mail two months in advance. If you don't receive a letter, please call our office to schedule the follow-up appointment.   

## 2014-09-07 NOTE — Assessment & Plan Note (Signed)
Periods of tachycardia and palpitations at nighttime. She has propranolol but has not taken this yet. We have suggested if symptoms persist, that she call the office. We would start metoprolol succinate 25 mg daily

## 2014-09-07 NOTE — Assessment & Plan Note (Signed)
Currently with no symptoms of angina. No further workup at this time. Continue current medication regimen. 

## 2014-09-07 NOTE — Assessment & Plan Note (Signed)
Blood pressure is well controlled on today's visit. No changes made to the medications. 

## 2014-09-10 ENCOUNTER — Other Ambulatory Visit: Payer: Self-pay | Admitting: Internal Medicine

## 2014-09-10 DIAGNOSIS — Z1231 Encounter for screening mammogram for malignant neoplasm of breast: Secondary | ICD-10-CM

## 2014-10-08 ENCOUNTER — Ambulatory Visit: Payer: Medicare Other | Attending: Internal Medicine

## 2014-10-29 ENCOUNTER — Encounter: Payer: Self-pay | Admitting: *Deleted

## 2014-10-29 ENCOUNTER — Other Ambulatory Visit: Payer: Self-pay | Admitting: Internal Medicine

## 2014-10-29 ENCOUNTER — Ambulatory Visit
Admission: RE | Admit: 2014-10-29 | Discharge: 2014-10-29 | Disposition: A | Discharge status: Home / Self Care | Payer: Medicare Other | Source: Ambulatory Visit | Attending: Internal Medicine | Admitting: Internal Medicine

## 2014-10-29 DIAGNOSIS — Z1231 Encounter for screening mammogram for malignant neoplasm of breast: Secondary | ICD-10-CM

## 2014-11-01 ENCOUNTER — Encounter
Admission: RE | Disposition: A | Discharge status: Home / Self Care | Payer: Self-pay | Source: Ambulatory Visit | Attending: Gastroenterology

## 2014-11-01 ENCOUNTER — Encounter: Payer: Self-pay | Admitting: Anesthesiology

## 2014-11-01 ENCOUNTER — Ambulatory Visit
Admission: RE | Admit: 2014-11-01 | Discharge: 2014-11-01 | Disposition: A | Discharge status: Home / Self Care | Payer: Medicare Other | Source: Ambulatory Visit | Attending: Gastroenterology | Admitting: Gastroenterology

## 2014-11-01 ENCOUNTER — Ambulatory Visit: Payer: Medicare Other | Admitting: Anesthesiology

## 2014-11-01 DIAGNOSIS — K449 Diaphragmatic hernia without obstruction or gangrene: Secondary | ICD-10-CM | POA: Insufficient documentation

## 2014-11-01 DIAGNOSIS — I2510 Atherosclerotic heart disease of native coronary artery without angina pectoris: Secondary | ICD-10-CM | POA: Insufficient documentation

## 2014-11-01 DIAGNOSIS — K222 Esophageal obstruction: Secondary | ICD-10-CM | POA: Diagnosis not present

## 2014-11-01 DIAGNOSIS — Z7982 Long term (current) use of aspirin: Secondary | ICD-10-CM | POA: Diagnosis not present

## 2014-11-01 DIAGNOSIS — Z79899 Other long term (current) drug therapy: Secondary | ICD-10-CM | POA: Insufficient documentation

## 2014-11-01 DIAGNOSIS — R1310 Dysphagia, unspecified: Secondary | ICD-10-CM | POA: Insufficient documentation

## 2014-11-01 HISTORY — PX: SAVORY DILATION: SHX5439

## 2014-11-01 HISTORY — PX: ESOPHAGOGASTRODUODENOSCOPY (EGD) WITH PROPOFOL: SHX5813

## 2014-11-01 LAB — GLUCOSE, CAPILLARY: Glucose-Capillary: 228 mg/dL — ABNORMAL HIGH (ref 65–99)

## 2014-11-01 SURGERY — ESOPHAGOGASTRODUODENOSCOPY (EGD) WITH PROPOFOL
Anesthesia: General

## 2014-11-01 MED ORDER — PROPOFOL 10 MG/ML IV BOLUS
INTRAVENOUS | Status: DC | PRN
  Administered 2014-11-01: 100 mg via INTRAVENOUS

## 2014-11-01 MED ORDER — GLYCOPYRROLATE 0.2 MG/ML IJ SOLN
INTRAMUSCULAR | Status: DC | PRN
  Administered 2014-11-01: 0.3 mg via INTRAVENOUS

## 2014-11-01 MED ORDER — PROPOFOL INFUSION 10 MG/ML OPTIME
INTRAVENOUS | Status: DC | PRN
Start: 2014-11-01 — End: 2014-11-01
  Administered 2014-11-01: 180 ug/kg/min via INTRAVENOUS

## 2014-11-01 MED ORDER — LIDOCAINE HCL (PF) 1 % IJ SOLN
2.0000 mL | Freq: Once | INTRAMUSCULAR | Status: AC
  Administered 2014-11-01: 0.3 mL via INTRADERMAL
  Filled 2014-11-01: qty 2

## 2014-11-01 MED ORDER — LIDOCAINE HCL (CARDIAC) 20 MG/ML IV SOLN
INTRAVENOUS | Status: DC | PRN
  Administered 2014-11-01: 100 mg via INTRAVENOUS

## 2014-11-01 MED ORDER — SODIUM CHLORIDE 0.9 % IV SOLN
INTRAVENOUS | Status: DC
Start: 2014-11-01 — End: 2014-11-01
  Administered 2014-11-01: 1000 mL via INTRAVENOUS

## 2014-11-01 NOTE — H&P (Signed)
  Date of Initial H&P:10/27/2013 History reviewed, patient examined, no change in status, stable for surgery.

## 2014-11-01 NOTE — Op Note (Signed)
[facility] Patient Name: [name] Procedure Date: 11/01/2014 11:34 AM MRN: [id number] Account #: [id number] Date of Birth: 11/23/1945 Admit Type: Outpatient Age: 69 Room: [facility] ENDO ROOM 4 Gender: Female Note Status: Finalized Procedure:         Upper GI endoscopy Indications:       Dysphagia Providers:         Lupita Dawn. Candace Cruise, MD Referring MD:      Rusty Aus, MD (Referring MD) Medicines:         Monitored Anesthesia Care Complications:     No immediate complications. Procedure:         Pre-Anesthesia Assessment:                    - Prior to the procedure, a History and Physical was                     performed, and patient medications, allergies and                     sensitivities were reviewed. The patient's tolerance of                     previous anesthesia was reviewed.                    - The risks and benefits of the procedure and the sedation                     options and risks were discussed with the patient. All                     questions were answered and informed consent was obtained.                    - After reviewing the risks and benefits, the patient was                     deemed in satisfactory condition to undergo the procedure.                    After obtaining informed consent, the endoscope was passed                     under direct vision. Throughout the procedure, the                     patient's blood pressure, pulse, and oxygen saturations                     were monitored continuously. The Olympus GIF-160 endoscope                     (S#. [id number]) was introduced through the mouth, and                     advanced to the second part of duodenum. The upper GI                     endoscopy was accomplished without difficulty. The patient                     tolerated the procedure well. Findings:      A benign-appearing, intrinsic mild stenosis was found at the  gastroesophageal junction. The  scope was withdrawn. Dilation was       performed with a Maloney dilator with mild resistance at 74 Fr.      The exam was otherwise without abnormality.      A single small submucosal papule (nodule) was found in the gastric       antrum. Biopsies were taken with a cold forceps for histology.      The exam was otherwise without abnormality.      The examined duodenum was normal.      A small hiatus hernia was present. Impression:        - Benign-appearing esophageal stricture. Dilated.                    - The examination was otherwise normal.                    - A single submucosal papule (nodule) found in the                     stomach. Biopsied.                    - The examination was otherwise normal.                    - Normal examined duodenum.                    - Small hiatus hernia. Recommendation:    - Discharge patient to home.                    - Observe patient's clinical course.                    - Await pathology results.                    - The findings and recommendations were discussed with the                     patient. Procedure Code(s): --- Professional ---                    [phone], Esophagogastroduodenoscopy, flexible, transoral;                     with biopsy, single or multiple                    43450, Dilation of esophagus, by unguided sound or bougie,                     single or multiple passes Diagnosis Code(s): --- Professional ---                    K22.2, Esophageal obstruction                    K31.9, Disease of stomach and duodenum, unspecified                    K44.9, Diaphragmatic hernia without obstruction or gangrene                    R13.10, Dysphagia, unspecified CPT copyright 2014 [employer]. All rights reserved. The codes documented in this report are preliminary and upon coder review may  be revised to meet current compliance  requirements. Hulen Luster, MD 11/01/2014 11:51:19 AM This report has been signed  electronically. Number of Addenda: 0 Note Initiated On: 11/01/2014 11:34 AM      [facility]

## 2014-11-01 NOTE — Anesthesia Preprocedure Evaluation (Signed)
Anesthesia Evaluation  Patient identified by MRN, date of birth, ID band Patient awake    Reviewed: Allergy & Precautions, H&P , NPO status , Patient's Chart, lab work & pertinent test results, reviewed documented beta blocker date and time   History of Anesthesia Complications Negative for: history of anesthetic complications  Airway Mallampati: III  TM Distance: >3 FB Neck ROM: full  Mouth opening: Limited Mouth Opening  Dental no notable dental hx. (+) Teeth Intact   Pulmonary shortness of breath and with exertion, sleep apnea and Continuous Positive Airway Pressure Ventilation , neg COPD, neg recent URI,    Pulmonary exam normal breath sounds clear to auscultation       Cardiovascular Exercise Tolerance: Good hypertension, On Medications (-) angina+ CAD  (-) Past MI, (-) Cardiac Stents and (-) CABG Normal cardiovascular exam(-) dysrhythmias (-) Valvular Problems/Murmurs Rhythm:regular Rate:Normal     Neuro/Psych PSYCHIATRIC DISORDERS (depression) negative neurological ROS     GI/Hepatic Neg liver ROS, GERD  Medicated and Poorly Controlled,  Endo/Other  diabetes, Poorly Controlled, Oral Hypoglycemic AgentsHypothyroidism   Renal/GU negative Renal ROS  negative genitourinary   Musculoskeletal   Abdominal   Peds  Hematology negative hematology ROS (+)   Anesthesia Other Findings Past Medical History:   Hypertension                                                 Diabetes mellitus                                            Hyperlipidemia                                               Chest pain                                                   Obesity                                                      Gallstones                                                   OSA on CPAP                                                  Sleep apnea  GERD (gastroesophageal reflux  disease)                       Depression                                                   Thyroid disease                                              Dermatitis                                                   Reproductive/Obstetrics negative OB ROS                             Anesthesia Physical Anesthesia Plan  ASA: III  Anesthesia Plan: General   Post-op Pain Management:    Induction:   Airway Management Planned:   Additional Equipment:   Intra-op Plan:   Post-operative Plan:   Informed Consent: I have reviewed the patients History and Physical, chart, labs and discussed the procedure including the risks, benefits and alternatives for the proposed anesthesia with the patient or authorized representative who has indicated his/her understanding and acceptance.   Dental Advisory Given  Plan Discussed with: Anesthesiologist, CRNA and Surgeon  Anesthesia Plan Comments:         Anesthesia Quick Evaluation

## 2014-11-01 NOTE — Transfer of Care (Signed)
Immediate Anesthesia Transfer of Care Note  Patient: [name]  Procedure(s) Performed: Procedure(s): ESOPHAGOGASTRODUODENOSCOPY (EGD) WITH PROPOFOL (N/A) SAVORY DILATION (N/A)  Patient Location: PACU  Anesthesia Type:General  Level of Consciousness: awake  Airway & Oxygen Therapy: Patient Spontanous Breathing and Patient connected to nasal cannula oxygen  Post-op Assessment: Report given to RN and Post -op Vital signs reviewed and stable  Post vital signs: Reviewed and stable  Last Vitals:  Filed Vitals:   11/01/14 1156  BP: 142/74  Pulse: 94  Temp: 36.9 C  Resp: 13    Complications: No apparent anesthesia complications

## 2014-11-02 LAB — SURGICAL PATHOLOGY

## 2014-11-02 NOTE — Anesthesia Postprocedure Evaluation (Signed)
  Anesthesia Post-op Note  Patient: [name]  Procedure(s) Performed: Procedure(s): ESOPHAGOGASTRODUODENOSCOPY (EGD) WITH PROPOFOL (N/A) SAVORY DILATION (N/A)  Anesthesia type:General  Patient location: PACU  Post pain: Pain level controlled  Post assessment: Post-op Vital signs reviewed, Patient's Cardiovascular Status Stable, Respiratory Function Stable, Patent Airway and No signs of Nausea or vomiting  Post vital signs: Reviewed and stable  Last Vitals:  Filed Vitals:   11/01/14 1226  BP: 114/79  Pulse: 96  Temp:   Resp: 15    Level of consciousness: awake, alert  and patient cooperative  Complications: No apparent anesthesia complications

## 2014-11-03 ENCOUNTER — Encounter: Payer: Self-pay | Admitting: Gastroenterology

## 2015-03-11 ENCOUNTER — Encounter: Payer: Self-pay | Admitting: Cardiovascular Disease

## 2015-03-11 ENCOUNTER — Ambulatory Visit (INDEPENDENT_AMBULATORY_CARE_PROVIDER_SITE_OTHER): Payer: Medicare Other | Admitting: Cardiovascular Disease

## 2015-03-11 VITALS — BP 118/60 | HR 105 | Ht 63.5 in | Wt 204.5 lb

## 2015-03-11 DIAGNOSIS — E1159 Type 2 diabetes mellitus with other circulatory complications: Secondary | ICD-10-CM

## 2015-03-11 DIAGNOSIS — R000 Tachycardia, unspecified: Secondary | ICD-10-CM

## 2015-03-11 DIAGNOSIS — I2510 Atherosclerotic heart disease of native coronary artery without angina pectoris: Secondary | ICD-10-CM | POA: Diagnosis not present

## 2015-03-11 DIAGNOSIS — E785 Hyperlipidemia, unspecified: Secondary | ICD-10-CM

## 2015-03-11 DIAGNOSIS — R0602 Shortness of breath: Secondary | ICD-10-CM

## 2015-03-11 DIAGNOSIS — I10 Essential (primary) hypertension: Secondary | ICD-10-CM | POA: Diagnosis not present

## 2015-03-11 DIAGNOSIS — G4733 Obstructive sleep apnea (adult) (pediatric): Secondary | ICD-10-CM

## 2015-03-11 DIAGNOSIS — Z9989 Dependence on other enabling machines and devices: Secondary | ICD-10-CM

## 2015-03-11 MED ORDER — METOPROLOL SUCCINATE ER 25 MG PO TB24: 25.0000 mg | ORAL_TABLET | Freq: Every day | ORAL | Status: DC

## 2015-03-11 NOTE — Progress Notes (Signed)
Patient ID: [name], female    DOB: 08/30/1945, 70 y.o.   MRN: [id number]  HPI Comments: 70 year old woman with CAD, cardiac catheterization in 2009 and June 2013  showing moderate LAD, diagonal and RCA disease, Obesity, diabetes, hyperlipidemia with statin intolerance, obstructive sleep apnea on CPAP,  Tachycardia episodes,  previous leg edema,  seen in the hospital for severe hypertension and chest pain, who presents for routine followup of her coronary artery disease  In follow-up today, she reports that she has been started on insulin as sugars were more than 400 Now sugars are improved but labile, sometimes running less than 100 Also started on medication for depression. Sometimes feels little bit dizzy while she is sitting, she is unclear if this is her sugars or new medication Reports her blood pressure is not low  She does report heart rate is elevated even at rest. Currently not on a beta blocker No regular exercise, weight continues to be a problem Denies any symptoms concerning for angina She continues on praluent 150 mg every 14 days.  total cholesterol is 169, LDL 69 has increased recently with weight gain and sugars up to total cholesterol 186, LDL 99, hemoglobin A1c 8.6. Numbers were reviewed with her  EKG on today's visit shows normal sinus rhythm with rate 105 bpm, no significant ST or T-wave changes  Other past medical history  using her CPAP. Previous blood work showing total cholesterol 238, triglycerides 462, hemoglobin A1c 7.0 She was unable to tolerate Vytorin as well as other statins   Admission on October 1, discharge on November 14 2010. She ruled out by cardiac enzymes, chest CT was negative for PE. She had a stress test that showed no ischemia. Her medications were adjusted. CT Scan did show fatty infiltration of the liver, granulomatous lesion of the left lower lobe  cardiac catheterization 07/25/2011 for chest pain   This showed left dominant coronary  system with moderate mid LAD, proximal diagonal #1 and proximal RCA disease all estimated at 50%, normal LV systolic function.   Allergies  Allergen Reactions  . Codeine     rash  . Penicillins     rash  . Vicodin [Hydrocodone-Acetaminophen]     Rash     Outpatient Encounter Prescriptions as of 03/11/2015  Medication Sig  . Alirocumab (PRALUENT) 150 MG/ML SOPN Inject 1 pen into the skin every 14 (fourteen) days.  [name] aspirin 81 MG tablet Take 81 mg by mouth daily.    . cholecalciferol (VITAMIN D) 1000 UNITS tablet Take 1,000 Units by mouth daily.  . DULoxetine (CYMBALTA) 30 MG capsule Take 30 mg by mouth daily.  [name] esomeprazole (NEXIUM) 40 MG capsule Take 40 mg by mouth 2 (two) times daily.   [name] estrogen-methylTESTOSTERone 0.625-1.25 MG per tablet Take 1 tablet by mouth daily.  [name] etodolac (LODINE) 300 MG capsule Take 300 mg by mouth 2 (two) times daily.   [name] gabapentin (NEURONTIN) 300 MG capsule Take 300 mg tablets 3-4 times daily.  [name] glimepiride (AMARYL) 1 MG tablet Take 2 tablets in the am & 1 tablet in the pm.  . hydrOXYzine (ATARAX/VISTARIL) 50 MG tablet Take 50 mg by mouth 2 (two) times daily.   . insulin aspart (NOVOLOG) 100 UNIT/ML FlexPen Inject 5 Units into the skin every evening.   . isosorbide mononitrate (IMDUR) 30 MG 24 hr tablet Take 30 mg by mouth as needed.  [name] LANTUS SOLOSTAR 100 UNIT/ML Solostar Pen inject 30 units every evening  . levothyroxine (  SYNTHROID, LEVOTHROID) 75 MCG tablet Take 75 mcg by mouth daily before breakfast.   . lisinopril (PRINIVIL,ZESTRIL) 20 MG tablet Take 1 tablet (20 mg total) by mouth daily.  . methocarbamol (ROBAXIN) 750 MG tablet Take 1/2 to 1 tablet three times a day as needed.  . nitroGLYCERIN (NITROSTAT) 0.4 MG SL tablet Place 1 tablet (0.4 mg total) under the tongue every 5 (five) minutes as needed for chest pain.  [name] propranolol (INDERAL) 20 MG tablet Take 1 tablet (20 mg total) by mouth 3 (three) times daily as needed.  [name] rOPINIRole  (REQUIP) 4 MG tablet Take 4 mg by mouth at bedtime.   [name] TEMAZEPAM PO Take by mouth daily.  [name] torsemide (DEMADEX) 20 MG tablet Take one tablet every third day.  . metoprolol succinate (TOPROL XL) 25 MG 24 hr tablet Take 1 tablet (25 mg total) by mouth daily.  . [DISCONTINUED] doxycycline (VIBRA-TABS) 100 MG tablet Take 100 mg by mouth daily. Reported on 03/11/2015  . [DISCONTINUED] escitalopram (LEXAPRO) 20 MG tablet Take 20 mg by mouth daily. Reported on 03/11/2015  . [DISCONTINUED] sitaGLIPtin (JANUVIA) 100 MG tablet Take 100 mg by mouth daily. Reported on 03/11/2015   No facility-administered encounter medications on file as of 03/11/2015.    Past Medical History  Diagnosis Date  . Hypertension   . Diabetes mellitus   . Hyperlipidemia   . Chest pain   . Obesity   . Gallstones   . OSA on CPAP   . Sleep apnea   . GERD (gastroesophageal reflux disease)   . Depression   . Thyroid disease   . Dermatitis     Past Surgical History  Procedure Laterality Date  . Abdominal hysterectomy    . Tonsillectomy    . Cholecystectomy    . Cardiac catheterization      Normal  . Cataract extraction, bilateral    . Implantable contact lens implantation      bilateral  . Esophagogastroduodenoscopy (egd) with propofol N/A 11/01/2014    Procedure: ESOPHAGOGASTRODUODENOSCOPY (EGD) WITH PROPOFOL;  Surgeon: Hulen Luster, MD;  Location[facility] ENDOSCOPY;  Service: Gastroenterology;  Laterality: N/A;  . Savory dilation N/A 11/01/2014    Procedure: SAVORY DILATION;  Surgeon: Hulen Luster, MD;  Location: [facility] ENDOSCOPY;  Service: Gastroenterology;  Laterality: N/A;    Social History  reports that she has never smoked. She has never used smokeless tobacco. She reports that she does not drink alcohol or use illicit drugs.  Family History family history includes Breast cancer (age of onset: 19) in her mother; Heart attack in her sister; Heart disease in her brother, father, mother, and sister.  Review of Systems   Constitutional: Negative.   Respiratory: Negative.   Cardiovascular: Positive for palpitations.  Gastrointestinal: Negative.   Musculoskeletal: Positive for back pain and arthralgias.  Allergic/Immunologic: Negative.   Neurological: Positive for dizziness.  Hematological: Negative.   Psychiatric/Behavioral: Negative.   All other systems reviewed and are negative.  BP 118/60 mmHg  Pulse 105  Ht 5' 3.5" (1.613 m)  Wt 204 lb 8 oz (92.761 kg)  BMI 35.65 kg/m2  Physical Exam  Constitutional: She is oriented to person, place, and time. She appears well-developed and well-nourished.  HENT:  Head: Normocephalic.  Nose: Nose normal.  Mouth/Throat: Oropharynx is clear and moist.  Eyes: Conjunctivae are normal. Pupils are equal, round, and reactive to light.  Neck: Normal range of motion. Neck supple. No JVD present.  Cardiovascular: Normal rate, regular rhythm, S1 normal,  S2 normal and intact distal pulses.   No extrasystoles are present. Exam reveals no gallop and no friction rub.   Murmur heard.  Crescendo systolic murmur is present with a grade of 2/6  Pulmonary/Chest: Effort normal and breath sounds normal. No respiratory distress. She has no wheezes. She has no rales. She exhibits no tenderness.  Abdominal: Soft. Bowel sounds are normal. She exhibits no distension. There is no tenderness.  Obese  Musculoskeletal: Normal range of motion. She exhibits no edema or tenderness.  Lymphadenopathy:    She has no cervical adenopathy.  Neurological: She is alert and oriented to person, place, and time. Coordination normal.  Skin: Skin is warm and dry. No rash noted. No erythema.  Psychiatric: She has a normal mood and affect. Her behavior is normal. Judgment and thought content normal.  anxious  Nursing note and vitals reviewed.     Assessment and Plan

## 2015-03-11 NOTE — Patient Instructions (Addendum)
You are doing well.  Please start metoprolol one a day with breakfast If you get fatigue, take at night  Continue to take propranolol  As needed  Please call us if you have new issues that need to be addressed before your next appt.  Your physician wants you to follow-up in: 6 months.  You will receive a reminder letter in the mail two months in advance. If you don't receive a letter, please call our office to schedule the follow-up appointment.

## 2015-03-11 NOTE — Assessment & Plan Note (Signed)
Currently with no symptoms of angina. No further workup at this time. Continue current medication regimen. 

## 2015-03-11 NOTE — Assessment & Plan Note (Signed)
She reports compliance with her CPAP

## 2015-03-11 NOTE — Assessment & Plan Note (Signed)
Heart rate is elevated, recommended she start metoprolol succinate 25 mg in the morning Suggested she monitor her blood pressure

## 2015-03-11 NOTE — Assessment & Plan Note (Signed)
Cholesterol improved on praluent, Numbers to climb with worsening diabetes control No changes to the medication

## 2015-03-11 NOTE — Assessment & Plan Note (Signed)
Recommended that she start metoprolol succinate 25 mill grams daily. This could be titrated upwards as tolerated for rate control

## 2015-03-11 NOTE — Assessment & Plan Note (Signed)
We have encouraged continued exercise, careful diet management in an effort to lose weight. 

## 2015-03-11 NOTE — Assessment & Plan Note (Signed)
Mild shortness of breath on exertion, likely secondary to obesity, deconditioning. Unable to exclude tachycardia. Beta blocker added for rate control   Total encounter time more than 25 minutes  Greater than 50% was spent in counseling and coordination of care with the patient

## 2015-07-20 ENCOUNTER — Other Ambulatory Visit: Payer: Self-pay | Admitting: Cardiovascular Disease

## 2015-07-21 ENCOUNTER — Other Ambulatory Visit: Payer: Self-pay | Admitting: Physical Medicine and Rehabilitation

## 2015-07-21 DIAGNOSIS — M5417 Radiculopathy, lumbosacral region: Secondary | ICD-10-CM

## 2015-07-22 ENCOUNTER — Other Ambulatory Visit: Payer: Self-pay

## 2015-07-22 ENCOUNTER — Telehealth: Payer: Self-pay | Admitting: Cardiovascular Disease

## 2015-07-22 DIAGNOSIS — E785 Hyperlipidemia, unspecified: Secondary | ICD-10-CM

## 2015-07-22 NOTE — Telephone Encounter (Signed)
Please see note below. 

## 2015-07-22 NOTE — Telephone Encounter (Signed)
Scheduled for Tuesday at 8 am.

## 2015-07-22 NOTE — Telephone Encounter (Signed)
Pt needs Praluent refills but has not had labs since Feb 2016.  Will need labs before prior authorization can be completed. Forward to MD for lipid/liver order.

## 2015-07-22 NOTE — Telephone Encounter (Signed)
*  STAT* If patient is at the pharmacy, call can be transferred to refill team.   1. Which medications need to be refilled? (please list name of each medication and dose if known)  Alirocumab (PRALUENT) 150 MG/ML SOPN   2. Which pharmacy/location (including street and city if local pharmacy) is medication to be sent to? [facility]    Doctor line [phone]  3. Do they need a 30 day or 90 day supply? 90 day       Need prior auth

## 2015-07-22 NOTE — Telephone Encounter (Signed)
No lab results available since 2016.  Will need recent labs before prior authorization can be completed.  Also, if original application is at the [place] office, please send to Gay Filler at [facility] so we can have all of her medication history.

## 2015-07-22 NOTE — Telephone Encounter (Signed)
Lipid/liver lab order placed. Left message on pt home VM to call back and schedule fasting labs.

## 2015-07-22 NOTE — Telephone Encounter (Signed)
Please review for prior authorization. Thanks!  

## 2015-07-26 ENCOUNTER — Other Ambulatory Visit (INDEPENDENT_AMBULATORY_CARE_PROVIDER_SITE_OTHER): Payer: Medicare Other

## 2015-07-26 DIAGNOSIS — E785 Hyperlipidemia, unspecified: Secondary | ICD-10-CM

## 2015-07-27 LAB — LIPID PANEL
Chol/HDL Ratio: 5.1 ratio units — ABNORMAL HIGH (ref 0.0–4.4)
Cholesterol, Total: 163 mg/dL (ref 100–199)
HDL: 32 mg/dL — AB (ref >39)
LDL CALC: 94 mg/dL (ref 0–99)
Triglycerides: 187 mg/dL — ABNORMAL HIGH (ref 0–149)
VLDL CHOLESTEROL CAL: 37 mg/dL (ref 5–40)

## 2015-07-27 LAB — HEPATIC FUNCTION PANEL
ALBUMIN: 3.9 g/dL (ref 3.5–4.8)
ALT: 13 IU/L (ref 0–32)
AST: 17 IU/L (ref 0–40)
Alkaline Phosphatase: 87 IU/L (ref 39–117)
Bilirubin Total: 0.3 mg/dL (ref 0.0–1.2)
Bilirubin, Direct: 0.08 mg/dL (ref 0.00–0.40)
TOTAL PROTEIN: 6.3 g/dL (ref 6.0–8.5)

## 2015-08-11 ENCOUNTER — Ambulatory Visit
Admission: RE | Admit: 2015-08-11 | Discharge: 2015-08-11 | Disposition: A | Discharge status: Home / Self Care | Payer: Medicare Other | Source: Ambulatory Visit | Attending: Physical Medicine and Rehabilitation | Admitting: Physical Medicine and Rehabilitation

## 2015-08-11 DIAGNOSIS — M5417 Radiculopathy, lumbosacral region: Secondary | ICD-10-CM

## 2015-08-11 DIAGNOSIS — M5416 Radiculopathy, lumbar region: Secondary | ICD-10-CM | POA: Diagnosis present

## 2015-08-11 DIAGNOSIS — M5136 Other intervertebral disc degeneration, lumbar region: Secondary | ICD-10-CM | POA: Insufficient documentation

## 2015-08-26 DIAGNOSIS — M5416 Radiculopathy, lumbar region: Secondary | ICD-10-CM | POA: Insufficient documentation

## 2015-08-26 DIAGNOSIS — G8929 Other chronic pain: Secondary | ICD-10-CM | POA: Insufficient documentation

## 2015-08-26 DIAGNOSIS — M545 Low back pain, unspecified: Secondary | ICD-10-CM | POA: Insufficient documentation

## 2015-08-30 ENCOUNTER — Other Ambulatory Visit: Payer: Self-pay | Admitting: Neurosurgery

## 2015-08-30 DIAGNOSIS — M5416 Radiculopathy, lumbar region: Secondary | ICD-10-CM

## 2015-09-08 ENCOUNTER — Ambulatory Visit (INDEPENDENT_AMBULATORY_CARE_PROVIDER_SITE_OTHER): Payer: Medicare Other | Admitting: Cardiovascular Disease

## 2015-09-08 ENCOUNTER — Encounter: Payer: Self-pay | Admitting: Cardiovascular Disease

## 2015-09-08 VITALS — BP 102/60 | HR 87 | Ht 63.0 in | Wt 223.2 lb

## 2015-09-08 DIAGNOSIS — E785 Hyperlipidemia, unspecified: Secondary | ICD-10-CM | POA: Diagnosis not present

## 2015-09-08 DIAGNOSIS — I10 Essential (primary) hypertension: Secondary | ICD-10-CM | POA: Diagnosis not present

## 2015-09-08 DIAGNOSIS — R0602 Shortness of breath: Secondary | ICD-10-CM

## 2015-09-08 DIAGNOSIS — I2510 Atherosclerotic heart disease of native coronary artery without angina pectoris: Secondary | ICD-10-CM

## 2015-09-08 DIAGNOSIS — E1159 Type 2 diabetes mellitus with other circulatory complications: Secondary | ICD-10-CM

## 2015-09-08 DIAGNOSIS — R000 Tachycardia, unspecified: Secondary | ICD-10-CM

## 2015-09-08 NOTE — Patient Instructions (Signed)
Medication Instructions:   Stop the propanolol  Please move the metoprolol to the evening  If it acts up at night (tachycardia) Add back the propranolol to the evening   If you continue to have lightheaded spells, Call the office Drink fluids   Labwork:  No new labs  Testing/Procedures:  No new testing needed   Follow-Up: It was a pleasure seeing you in the office today. Please call us if you have new issues that need to be addressed before your next appt.  [phone]  Your physician wants you to follow-up in: 6 months.  You will receive a reminder letter in the mail two months in advance. If you don't receive a letter, please call our office to schedule the follow-up appointment.  If you need a refill on your cardiac medications before your next appointment, please call your pharmacy.

## 2015-09-08 NOTE — Progress Notes (Signed)
Cardiology Office Note  Date:  09/08/2015   ID:  [name], DOB [birth year], MRN [id number]  PCP:  Rusty Aus, MD   Chief Complaint  Patient presents with  . Other    C/o rapid heart beat or racing heart when resting and lightheadedness. Pt would like to discuss metoprolol and propranolol she is taking both. Meds reviewed verbally with pt.    HPI:  70 year old woman with CAD, cardiac catheterization in 2009 and June 2013  showing moderate LAD, diagonal and RCA disease, Obesity, diabetes, hyperlipidemia with statin intolerance, obstructive sleep apnea on CPAP,  Tachycardia episodes,  previous leg edema,  seen in the hospital for severe hypertension and chest pain, who presents for routine followup of her coronary artery disease  In follow-up, she reports that she continues to have tachycardia before bed Symptoms resolve on their own relatively shortly Typically is walking around getting ready then goes to bed. Does not think that she is over exerting herself  Rare lightheaded, but no significant orthostasis symptoms Weight up 204 to 223 pounds, blames the insulin.  Trying to work on her diet On praluent, total cholesterol 163, ldl 94. Medication renewed for 1 year No regular exercise program, limited by joint pain  EKG on today's visit shows normal sinus rhythm with rate 87 bpm, no significant ST or T-wave changes  Other past medical history  using her CPAP. Previous blood work showing total cholesterol 238, triglycerides 462, hemoglobin A1c 7.0 She was unable to tolerate Vytorin as well as other statins   Admission on October 1, discharge on November 14 2010. She ruled out by cardiac enzymes, chest CT was negative for PE. She had a stress test that showed no ischemia. Her medications were adjusted. CT Scan did show fatty infiltration of the liver, granulomatous lesion of the left lower lobe  cardiac catheterization 07/25/2011 for chest pain   This showed left dominant  coronary system with moderate mid LAD, proximal diagonal #1 and proximal RCA disease all estimated at 50%, normal LV systolic function.  PMH:   has a past medical history of Chest pain; Depression; Dermatitis; Diabetes mellitus; Gallstones; GERD (gastroesophageal reflux disease); Hyperlipidemia; Hypertension; Obesity; OSA on CPAP; Sleep apnea; and Thyroid disease.  PSH:    Past Surgical History:  Procedure Laterality Date  . ABDOMINAL HYSTERECTOMY    . CARDIAC CATHETERIZATION     Normal  . CATARACT EXTRACTION, BILATERAL    . CHOLECYSTECTOMY    . ESOPHAGOGASTRODUODENOSCOPY (EGD) WITH PROPOFOL N/A 11/01/2014   Procedure: ESOPHAGOGASTRODUODENOSCOPY (EGD) WITH PROPOFOL;  Surgeon: Hulen Luster, MD;  Location: [facility] ENDOSCOPY;  Service: Gastroenterology;  Laterality: N/A;  . IMPLANTABLE CONTACT LENS IMPLANTATION     bilateral  . SAVORY DILATION N/A 11/01/2014   Procedure: SAVORY DILATION;  Surgeon: Hulen Luster, MD;  Location: [facility] ENDOSCOPY;  Service: Gastroenterology;  Laterality: N/A;  . TONSILLECTOMY      Current Outpatient Prescriptions  Medication Sig Dispense Refill  . Alirocumab (PRALUENT) 150 MG/ML SOPN Inject 1 pen into the skin every 14 (fourteen) days. 6 pen 4  . aspirin 81 MG tablet Take 81 mg by mouth daily.      . cholecalciferol (VITAMIN D) 1000 UNITS tablet Take 1,000 Units by mouth daily.    . DULoxetine (CYMBALTA) 30 MG capsule Take 30 mg by mouth daily.    [name] esomeprazole (NEXIUM) 40 MG capsule Take 40 mg by mouth 2 (two) times daily.     [name] estrogen-methylTESTOSTERone 0.625-1.25 MG per tablet  Take 1 tablet by mouth daily.    [name] gabapentin (NEURONTIN) 300 MG capsule Take 300 mg tablets 3-4 times daily.    . hydrOXYzine (ATARAX/VISTARIL) 50 MG tablet Take 50 mg by mouth 2 (two) times daily.     . insulin aspart (NOVOLOG) 100 UNIT/ML FlexPen Inject into the skin. Sliding scale daily.    . isosorbide mononitrate (IMDUR) 30 MG 24 hr tablet Take 30 mg by mouth as needed.    [name] LANTUS  SOLOSTAR 100 UNIT/ML Solostar Pen inject 32 units every evening  0  . levothyroxine (SYNTHROID, LEVOTHROID) 75 MCG tablet Take 75 mcg by mouth daily before breakfast.     . lisinopril (PRINIVIL,ZESTRIL) 20 MG tablet Take 1 tablet (20 mg total) by mouth daily. 90 tablet 3  . methocarbamol (ROBAXIN) 750 MG tablet Take 1/2 to 1 tablet three times a day as needed.    . metoprolol succinate (TOPROL XL) 25 MG 24 hr tablet Take 1 tablet (25 mg total) by mouth daily. 90 tablet 3  . nitroGLYCERIN (NITROSTAT) 0.4 MG SL tablet Place 1 tablet (0.4 mg total) under the tongue every 5 (five) minutes as needed for chest pain. 25 tablet 3  . propranolol (INDERAL) 20 MG tablet TAKE 1 TABLET THREE TIMES A DAY AS NEEDED (Patient taking differently: TAKE 1 TABLET TWICE DAILY.) 180 tablet 3  . rOPINIRole (REQUIP) 4 MG tablet Take 4 mg by mouth at bedtime.     . tapentadol (NUCYNTA) 50 MG tablet Take 50 mg by mouth as needed.    [name] TEMAZEPAM PO Take 30 mg by mouth daily.     [name] torsemide (DEMADEX) 20 MG tablet Take one tablet every third day.     No current facility-administered medications for this visit.      Allergies:   Codeine; Penicillins; and Vicodin [hydrocodone-acetaminophen]   Social History:  The patient  reports that she has never smoked. She has never used smokeless tobacco. She reports that she does not drink alcohol or use drugs.   Family History:   family history includes Breast cancer (age of onset: 58) in her mother; Heart attack in her sister; Heart disease in her brother, father, mother, and sister.    Review of Systems: Review of Systems  Constitutional: Negative.        Weight gain  Respiratory: Positive for shortness of breath.   Cardiovascular: Negative.   Gastrointestinal: Negative.   Musculoskeletal: Negative.   Neurological: Positive for dizziness.  Psychiatric/Behavioral: Negative.   All other systems reviewed and are negative.    PHYSICAL EXAM: VS:  BP 102/60 (BP Location:  Left Arm, Patient Position: Sitting, Cuff Size: Large)   Pulse 87   Ht 5\' 3"  (1.6 m)   Wt 223 lb 4 oz (101.3 kg)   BMI 39.55 kg/m  , BMI Body mass index is 39.55 kg/m. GEN: Well nourished, well developed, in no acute distress, obese  HEENT: normal  Neck: no JVD, carotid bruits, or masses Cardiac: RRR; no murmurs, rubs, or gallops,no edema  Respiratory:  clear to auscultation bilaterally, normal work of breathing GI: soft, nontender, nondistended, + BS MS: no deformity or atrophy  Skin: warm and dry, no rash Neuro:  Strength and sensation are intact Psych: euthymic mood, full affect    Recent Labs: 07/26/2015: ALT 13    Lipid Panel Lab Results  Component Value Date   CHOL 163 07/26/2015   HDL 32 (L) 07/26/2015   LDLCALC 94 07/26/2015   TRIG 187 (H)  07/26/2015      Wt Readings from Last 3 Encounters:  09/08/15 223 lb 4 oz (101.3 kg)  03/11/15 204 lb 8 oz (92.8 kg)  11/01/14 205 lb (93 kg)       ASSESSMENT AND PLAN:  Rapid heart beat - Plan: EKG 12-Lead She is taking both metoprolol and propranolol Recommended she take metoprolol in the evening Propranolol only as needed in the evening for tachycardia when she goes to sleep Hold the propranolol in the morning secondary to fatigue in the day  Essential hypertension Blood pressure is well controlled on today's visit. No changes made to the medications.  Hyperlipidemia Cholesterol slightly above goal but dramatically improved on the praluent Could add zetia in follow-up to reach goal LDL less than 70  Coronary artery disease involving native coronary artery of native heart without angina pectoris Currently with no symptoms of angina. No further workup at this time. Continue current medication regimen.  DYSPNEA Mild chronic shortness of breath likely secondary to deconditioning, no regular exercise program. We have recommended water aerobics  Type 2 diabetes mellitus with other circulatory complication  (HCC) Currently on insulin, managed by primary care She attributes this to her weight gain    Total encounter time more than 25 minutes  Greater than 50% was spent in counseling and coordination of care with the patient   Disposition:   F/U  6 months   Orders Placed This Encounter  Procedures  . EKG 12-Lead     Signed, Esmond Plants, M.D., Ph.D. 09/08/2015  [place], [place]

## 2015-09-12 ENCOUNTER — Ambulatory Visit
Admission: RE | Admit: 2015-09-12 | Discharge: 2015-09-12 | Disposition: A | Discharge status: Home / Self Care | Payer: Medicare Other | Source: Ambulatory Visit | Attending: Neurosurgery | Admitting: Neurosurgery

## 2015-09-12 DIAGNOSIS — M5416 Radiculopathy, lumbar region: Secondary | ICD-10-CM

## 2015-09-12 MED ORDER — DIAZEPAM 5 MG PO TABS
5.0000 mg | ORAL_TABLET | Freq: Once | ORAL | Status: AC
  Administered 2015-09-12: 5 mg via ORAL

## 2015-09-12 MED ORDER — ONDANSETRON HCL 4 MG/2ML IJ SOLN: 4.0000 mg | Freq: Four times a day (QID) | INTRAMUSCULAR | Status: DC | PRN

## 2015-09-12 MED ORDER — IOPAMIDOL (ISOVUE-M 200) INJECTION 41%
15.0000 mL | Freq: Once | INTRAMUSCULAR | Status: AC
  Administered 2015-09-12: 15 mL via INTRATHECAL

## 2015-09-12 MED ORDER — ONDANSETRON HCL 4 MG/2ML IJ SOLN
4.0000 mg | Freq: Once | INTRAMUSCULAR | Status: AC
  Administered 2015-09-12: 4 mg via INTRAMUSCULAR

## 2015-09-12 MED ORDER — MEPERIDINE HCL 100 MG/ML IJ SOLN
75.0000 mg | Freq: Once | INTRAMUSCULAR | Status: AC
  Administered 2015-09-12: 75 mg via INTRAMUSCULAR

## 2015-09-12 NOTE — Discharge Instructions (Signed)
Myelogram Discharge Instructions  1. Go home and rest quietly for the next 24 hours.  It is important to lie flat for the next 24 hours.  Get up only to go to the restroom.  You may lie in the bed or on a couch on your back, your stomach, your left side or your right side.  You may have one pillow under your head.  You may have pillows between your knees while you are on your side or under your knees while you are on your back.  2. DO NOT drive today.  Recline the seat as far back as it will go, while still wearing your seat belt, on the way home.  3. You may get up to go to the bathroom as needed.  You may sit up for 10 minutes to eat.  You may resume your normal diet and medications unless otherwise indicated.  Drink lots of extra fluids today and tomorrow.  4. The incidence of headache, nausea, or vomiting is about 5% (one in 20 patients).  If you develop a headache, lie flat and drink plenty of fluids until the headache goes away.  Caffeinated beverages may be helpful.  If you develop severe nausea and vomiting or a headache that does not go away with flat bed rest, call [phone].  5. You may resume normal activities after your 24 hours of bed rest is over; however, do not exert yourself strongly or do any heavy lifting tomorrow. If when you get up you have a headache when standing, go back to bed and force fluids for another 24 hours.  6. Call your physician for a follow-up appointment.  The results of your myelogram will be sent directly to your physician by the following day.  7. If you have any questions or if complications develop after you arrive home, please call [phone].  Discharge instructions have been explained to the patient.  The patient, or the person responsible for the patient, fully understands these instructions.       May resume Cymbalta and Nucynta on Aug. 1, 2017, after 9:30 am.

## 2015-09-12 NOTE — Progress Notes (Signed)
Pt states she has been off Cymbalta and Nucynta for the past 2 days.

## 2015-09-14 ENCOUNTER — Telehealth: Payer: Self-pay

## 2015-09-14 NOTE — Progress Notes (Signed)
Left messages on patient's home and cell phones inquiring how she was doing after her myelogram here 09/12/15.

## 2015-09-14 NOTE — Telephone Encounter (Signed)
Left messages on patient's home and cell phones to see how she is doing after her myelogram here 09/12/15.  Also left our phone number to call us if she is having any problems.  jkl

## 2015-09-14 NOTE — Progress Notes (Signed)
Left messages on patient's home and cell phones asking her how she is feeling after her myelogram here 09/12/15.  Left her our number to call us if she is having any problems. jkl

## 2015-10-05 ENCOUNTER — Other Ambulatory Visit: Payer: Self-pay | Admitting: Neurosurgery

## 2015-10-06 ENCOUNTER — Telehealth: Payer: Self-pay | Admitting: Cardiovascular Disease

## 2015-10-06 NOTE — Telephone Encounter (Signed)
Received cardiac clearance request for pt to proceed w/ decompression laminectomy L3-L4, L4-L5, L5-S1 instrumentation w/ pedical screws/rods placement of interbody prosthesis, posterior lateral arthodesis, to be performed by Dr. Newman Pies on 11/14/15.   Please fax clearance to [facility] @ [phone].

## 2015-10-09 NOTE — Telephone Encounter (Signed)
Acceptable risk for surgery No further testing needed Would stay on low dose asa 81 daily

## 2015-10-10 NOTE — Telephone Encounter (Signed)
Routed to # provided. 

## 2015-11-02 ENCOUNTER — Telehealth: Payer: Self-pay | Admitting: Cardiovascular Disease

## 2015-11-02 NOTE — Telephone Encounter (Signed)
Pt states she is having "major Back surgery" on Oct 2. By Dr. Arnoldo Morale, in [place].  She asks if she can have clearance for this. Please call and advise.

## 2015-11-02 NOTE — Telephone Encounter (Signed)
Please see previous phone note.  She was cleared on 10/09/15.

## 2015-11-04 ENCOUNTER — Encounter (HOSPITAL_COMMUNITY): Payer: Self-pay

## 2015-11-04 ENCOUNTER — Encounter (HOSPITAL_COMMUNITY)
Admission: RE | Admit: 2015-11-04 | Discharge: 2015-11-04 | Disposition: A | Discharge status: Home / Self Care | Payer: Medicare Other | Source: Ambulatory Visit | Attending: Neurosurgery | Admitting: Neurosurgery

## 2015-11-04 DIAGNOSIS — M4806 Spinal stenosis, lumbar region: Secondary | ICD-10-CM | POA: Insufficient documentation

## 2015-11-04 DIAGNOSIS — Z01818 Encounter for other preprocedural examination: Secondary | ICD-10-CM | POA: Diagnosis not present

## 2015-11-04 DIAGNOSIS — Z01812 Encounter for preprocedural laboratory examination: Secondary | ICD-10-CM | POA: Diagnosis not present

## 2015-11-04 HISTORY — DX: Atherosclerotic heart disease of native coronary artery without angina pectoris: I25.10

## 2015-11-04 HISTORY — DX: Unspecified hemorrhoids: K64.9

## 2015-11-04 HISTORY — DX: Diverticulosis of intestine, part unspecified, without perforation or abscess without bleeding: K57.90

## 2015-11-04 HISTORY — DX: Personal history of colon polyps, unspecified: Z86.0100

## 2015-11-04 HISTORY — DX: Other muscle spasm: M62.838

## 2015-11-04 HISTORY — DX: Edema, unspecified: R60.9

## 2015-11-04 HISTORY — DX: Personal history of other specified conditions: Z87.898

## 2015-11-04 HISTORY — DX: Pain in unspecified joint: M25.50

## 2015-11-04 HISTORY — DX: Effusion, unspecified joint: M25.40

## 2015-11-04 HISTORY — DX: Personal history of other diseases of the musculoskeletal system and connective tissue: Z87.39

## 2015-11-04 HISTORY — DX: Personal history of colonic polyps: Z86.010

## 2015-11-04 HISTORY — DX: Constipation, unspecified: K59.00

## 2015-11-04 HISTORY — DX: Hypothyroidism, unspecified: E03.9

## 2015-11-04 HISTORY — DX: Insomnia, unspecified: G47.00

## 2015-11-04 HISTORY — DX: Rosacea, unspecified: L71.9

## 2015-11-04 HISTORY — DX: Weakness: R53.1

## 2015-11-04 HISTORY — DX: Other chronic pain: G89.29

## 2015-11-04 HISTORY — DX: Unspecified osteoarthritis, unspecified site: M19.90

## 2015-11-04 HISTORY — DX: Restless legs syndrome: G25.81

## 2015-11-04 HISTORY — DX: Asymptomatic varicose veins of unspecified lower extremity: I83.90

## 2015-11-04 HISTORY — DX: Dorsalgia, unspecified: M54.9

## 2015-11-04 LAB — BASIC METABOLIC PANEL
Anion gap: 7 (ref 5–15)
BUN: 20 mg/dL (ref 6–20)
CO2: 28 mmol/L (ref 22–32)
CREATININE: 1.1 mg/dL — AB (ref 0.44–1.00)
Calcium: 9.3 mg/dL (ref 8.9–10.3)
Chloride: 100 mmol/L — ABNORMAL LOW (ref 101–111)
GFR calc Af Amer: 58 mL/min — ABNORMAL LOW (ref >60)
GFR, EST NON AFRICAN AMERICAN: 50 mL/min — AB (ref >60)
GLUCOSE: 274 mg/dL — AB (ref 65–99)
Potassium: 4.2 mmol/L (ref 3.5–5.1)
SODIUM: 135 mmol/L (ref 135–145)

## 2015-11-04 LAB — TYPE AND SCREEN
ABO/RH(D): A POS
Antibody Screen: NEGATIVE

## 2015-11-04 LAB — CBC
HCT: 42.2 % (ref 36.0–46.0)
Hemoglobin: 13.5 g/dL (ref 12.0–15.0)
MCH: 28.4 pg (ref 26.0–34.0)
MCHC: 32 g/dL (ref 30.0–36.0)
MCV: 88.8 fL (ref 78.0–100.0)
Platelets: 212 10*3/uL (ref 150–400)
RBC: 4.75 MIL/uL (ref 3.87–5.11)
RDW: 12.7 % (ref 11.5–15.5)
WBC: 7 10*3/uL (ref 4.0–10.5)

## 2015-11-04 LAB — ABO/RH: ABO/RH(D): A POS

## 2015-11-04 LAB — SURGICAL PCR SCREEN
MRSA, PCR: NEGATIVE
STAPHYLOCOCCUS AUREUS: NEGATIVE

## 2015-11-04 LAB — GLUCOSE, CAPILLARY: GLUCOSE-CAPILLARY: 308 mg/dL — AB (ref 65–99)

## 2015-11-04 MED ORDER — CHLORHEXIDINE GLUCONATE CLOTH 2 % EX PADS: 6.0000 | MEDICATED_PAD | Freq: Once | CUTANEOUS | Status: DC

## 2015-11-04 NOTE — Progress Notes (Signed)
Cardiologist is Dr.Gollan with last visit 09-08-15  Medical Md is Dr.Mark Sabra Heck  Echo report in epic from 2009  Stress test report in epic from 2012  Heart cath in 2013 at [facility]  EKG in epic from 09-08-15  CXR denies in past yr

## 2015-11-04 NOTE — Pre-Procedure Instructions (Signed)
[name]  11/04/2015      [place], [place] [address] Phone: [phone] Fax: [phone]  [id number] [place], [place] [address] [place] [address] Phone: [phone] Fax: [phone]  [facility]) [place], [place] [address] Phone: [phone] Fax: [phone]  [place] [address] [place] - [place] [address] Phone: [phone] Fax: [phone]  [place], [place] [address] [place] [address] Phone: [phone] Fax: [place], [place] [address] [place] [address] Phone: [phone] Fax: [phone]  [facility] [place], [place] [address] [place] [address] Phone: [phone] Fax: [phone]  [facility] [id number] - [place], [place] [address] Phone: [phone] Fax: [phone]    Your procedure is scheduled on Mon, Oct 2 @ 9:30 AM  Report to [place] at 6:30 AM  Call this number if you have problems the morning of surgery:  [phone]   Remember:  Do not eat food or drink liquids after midnight.  Take these medicines the morning of surgery with A SIP OF WATER Cymbalta(Duloxetine),Nexiume(Esomeprazole),Gabapentin(Neurontin),Imdur(Isosorbide),Synthroid(Levothyroxine),Metoprolol(Toprol),Requip (Ropinirole),and Pain Pill(if needed)             Stop taking your Aspirin,Mobic,along with any Herbal Medications. No Goody's,BC's,Aleve,Advil,Motrin,or Fish Oil.                How to Manage Your  Diabetes Before and After Surgery  Why is it important to control my blood sugar before and after surgery? . Improving blood sugar levels before and after surgery helps healing and can limit problems. . A way of improving blood sugar control is eating a healthy diet by: o  Eating less sugar and carbohydrates o  Increasing activity/exercise o  Talking with your doctor about reaching your blood sugar goals . High blood sugars (greater than 180 mg/dL) can raise your risk of infections and slow your recovery, so you will need to focus on controlling your diabetes during the weeks before surgery. . Make sure that the doctor who takes care of your diabetes knows about your planned surgery including the date and location.  How do I manage my blood sugar before surgery? . Check your blood sugar at least 4 times a day, starting 2 days before surgery, to make sure that the level is not too high or low. o Check your blood sugar the morning of your surgery when you wake up and every 2 hours until you get to the Short Stay unit. . If your blood sugar is less than 70 mg/dL, you will need to treat for low blood sugar: o Do not take insulin. o Treat a low blood sugar (less than 70 mg/dL) with  cup of clear juice (cranberry or apple), 4 glucose tablets, OR glucose gel. o Recheck blood sugar in 15 minutes after treatment (to make sure it is greater than 70 mg/dL). If your blood sugar is not greater than 70 mg/dL on recheck, call [phone] for further instructions. . Report your blood sugar to the short stay nurse  when you get to Short Stay.  . If you are admitted to the hospital after surgery: o Your blood sugar will be checked by the staff and you will probably be given insulin after surgery (instead of oral diabetes medicines) to make sure you have good blood sugar levels. o The goal for blood sugar control after surgery is 80-180 mg/dL.              WHAT DO I DO ABOUT MY DIABETES  MEDICATION?   [name] Do not take oral diabetes medicines (pills) the morning of surgery.         . THE MORNING OF SURGERY, take _______16______ units of ______Lantus____insulin.  . The day of surgery, do not take other diabetes injectables, including Byetta (exenatide), Bydureon (exenatide ER), Victoza (liraglutide), or Trulicity (dulaglutide).  . If your CBG is greater than 220 mg/dL, you may take  of your sliding scale (correction) dose of insulin.  Other Instructions:          Patient Signature:  Date:   Nurse Signature:  Date:   Reviewed and Endorsed by [facility] Patient Education Committee, August 2015                  Do not wear jewelry, make-up or nail polish.  Do not wear lotions, powders, or perfumes, or deoderant.  Do not shave 48 hours prior to surgery. may   Do not bring valuables to the hospital.  [facility] is not responsible for any belongings or valuables.  Contacts, dentures or bridgework may not be worn into surgery.  Leave your suitcase in the car.  After surgery it may be brought to your room.  For patients admitted to the hospital, discharge time will be determined by your treatment team.  Patients discharged the day of surgery will not be allowed to drive home.   [facility] - Preparing for Surgery  Before surgery, you can play an important role.  Because skin is not sterile, your skin needs to be as free of germs as possible.  You can reduce the number of germs on you skin by washing with CHG (chlorahexidine gluconate) soap before surgery.  CHG is an antiseptic cleaner which kills germs and bonds with the skin to continue killing germs even after washing.  Please DO NOT use if you have an allergy to CHG or antibacterial soaps.  If your skin becomes reddened/irritated stop using the CHG and inform your nurse when you arrive at Short Stay.  Do not shave (including legs and underarms) for at least 48 hours prior to the first CHG shower.  You may  shave your face.  Please follow these instructions carefully:   1.  Shower with CHG Soap the night before surgery and the                                morning of Surgery.  2.  If you choose to wash your hair, wash your hair first as usual with your       normal shampoo.  3.  After you shampoo, rinse your hair and body thoroughly to remove the                      Shampoo.  4.  Use CHG as you would any other liquid soap.  You can apply chg directly       to the skin and  wash gently with scrungie or a clean washcloth.  5.  Apply the CHG Soap to your body ONLY FROM THE NECK DOWN.        Do not use on open wounds or open sores.  Avoid contact with your eyes,       ears, mouth and genitals (private parts).  Wash genitals (private parts)       with your normal soap.  6.  Wash thoroughly, paying special attention to the area where your surgery        will be performed.  7.  Thoroughly rinse your body with warm water from the neck down.  8.  DO NOT shower/wash with your normal soap after using and rinsing off       the CHG Soap.  9.  Pat yourself dry with a clean towel.            10.  Wear clean pajamas.            11.  Place clean sheets on your bed the night of your first shower and do not        sleep with pets.  Day of Surgery  Do not apply any lotions/deoderants the morning of surgery.  Please wear clean clothes to the hospital/surgery center.

## 2015-11-07 ENCOUNTER — Other Ambulatory Visit (HOSPITAL_COMMUNITY): Payer: Medicare Other

## 2015-11-07 NOTE — Progress Notes (Signed)
Anesthesia Chart Review:  Pt is a 70 year old female scheduled for L3-4, L4-5, L5-S1 decompression laminectomy on 11/14/2015 with Newman Pies, MD.   - Cardiologist is Ida Rogue, MD who cleared pt for surgery.  - PCP is Emily Filbert, MD (notes in care everywhere)  PMH includes:  CAD, HTN, DM, hyperlipidemia, OSA, hypothyroidism, GERD. Never smoker. BMI 39  Medications include: , alirocumab, ASA, nexium, lasix, novolog, imdur, lantus, levothyroxine, lisinopril, metoprolol, torsemide  Preoperative labs reviewed.  Glucose 274. HgbA1c was 7.4 on 09/29/15 (in care everywhere). I spoke with pt by telephone. She understands her surgery could be cancelled if her glucose is >200 DOS. I also left voicemail for [facility] in Dr. Arnoldo Morale' office about lab results.   EKG 09/08/15: NSR  Cardiac cath 07/25/11 (at [facility]; report in media tab): - Mid LAD: diffuse 50% stenosis - D1: diffuse 50% stenosis - proximal RCA: diffuse 50% stenosis  If blood glucose is acceptable DOS, I anticipate pt can proceed as scheduled.   Willeen Cass, FNP-BC [facility] Short Stay Surgical Center/Anesthesiology Phone: [phone] 11/07/2015 3:00 PM

## 2015-11-09 ENCOUNTER — Telehealth: Payer: Self-pay

## 2015-11-09 NOTE — Telephone Encounter (Signed)
LMOM that [facility] has approved the Praluent Pen Inject through 08/09/2016.

## 2015-11-11 MED ORDER — VANCOMYCIN HCL 10 G IV SOLR
1500.0000 mg | INTRAVENOUS | Status: AC
  Administered 2015-11-14: 1500 mg via INTRAVENOUS
  Filled 2015-11-11: qty 1500

## 2015-11-13 HISTORY — PX: LUMBAR FUSION: SHX111

## 2015-11-13 HISTORY — PX: LAMINECTOMY: SHX219

## 2015-11-13 NOTE — Anesthesia Preprocedure Evaluation (Addendum)
Anesthesia Evaluation  Patient identified by MRN, date of birth, ID band Patient awake    Reviewed: Allergy & Precautions, H&P , NPO status , Patient's Chart, lab work & pertinent test results, reviewed documented beta blocker date and time   Airway Mallampati: IV  TM Distance: >3 FB Neck ROM: Full    Dental no notable dental hx. (+) Teeth Intact, Dental Advisory Given   Pulmonary sleep apnea and Continuous Positive Airway Pressure Ventilation ,    Pulmonary exam normal breath sounds clear to auscultation       Cardiovascular hypertension, Pt. on medications and Pt. on home beta blockers  Rhythm:Regular Rate:Normal     Neuro/Psych Anxiety Depression negative neurological ROS     GI/Hepatic Neg liver ROS, GERD  Medicated and Controlled,  Endo/Other  diabetes, Insulin DependentHypothyroidism Morbid obesity  Renal/GU negative Renal ROS  negative genitourinary   Musculoskeletal  (+) Arthritis , Osteoarthritis,    Abdominal   Peds  Hematology negative hematology ROS (+)   Anesthesia Other Findings   Reproductive/Obstetrics negative OB ROS                            Anesthesia Physical Anesthesia Plan  ASA: III  Anesthesia Plan: General   Post-op Pain Management:    Induction: Intravenous  Airway Management Planned: Oral ETT and Video Laryngoscope Planned  Additional Equipment: Arterial line  Intra-op Plan:   Post-operative Plan: Possible Post-op intubation/ventilation and Extubation in OR  Informed Consent: I have reviewed the patients History and Physical, chart, labs and discussed the procedure including the risks, benefits and alternatives for the proposed anesthesia with the patient or authorized representative who has indicated his/her understanding and acceptance.   Dental advisory given  Plan Discussed with: CRNA  Anesthesia Plan Comments:        Anesthesia Quick  Evaluation

## 2015-11-14 ENCOUNTER — Encounter (HOSPITAL_COMMUNITY): Payer: Self-pay | Admitting: *Deleted

## 2015-11-14 ENCOUNTER — Encounter (HOSPITAL_COMMUNITY)
Admission: RE | Disposition: A | Discharge status: Skilled Nursing Facility | Payer: Self-pay | Source: Ambulatory Visit | Attending: Neurosurgery

## 2015-11-14 ENCOUNTER — Inpatient Hospital Stay (HOSPITAL_COMMUNITY): Payer: Medicare Other

## 2015-11-14 ENCOUNTER — Inpatient Hospital Stay (HOSPITAL_COMMUNITY): Payer: Medicare Other | Admitting: Emergency Medicine

## 2015-11-14 ENCOUNTER — Inpatient Hospital Stay (HOSPITAL_COMMUNITY): Payer: Medicare Other | Admitting: Anesthesiology

## 2015-11-14 ENCOUNTER — Inpatient Hospital Stay (HOSPITAL_COMMUNITY)
Admission: RE | Admit: 2015-11-14 | Discharge: 2015-11-18 | DRG: 460 | Disposition: A | Discharge status: Skilled Nursing Facility | Payer: Medicare Other | Source: Ambulatory Visit | Attending: Neurosurgery | Admitting: Neurosurgery

## 2015-11-14 DIAGNOSIS — M4316 Spondylolisthesis, lumbar region: Secondary | ICD-10-CM | POA: Diagnosis present

## 2015-11-14 DIAGNOSIS — M4726 Other spondylosis with radiculopathy, lumbar region: Secondary | ICD-10-CM | POA: Diagnosis present

## 2015-11-14 DIAGNOSIS — Z794 Long term (current) use of insulin: Secondary | ICD-10-CM

## 2015-11-14 DIAGNOSIS — Z419 Encounter for procedure for purposes other than remedying health state, unspecified: Secondary | ICD-10-CM

## 2015-11-14 DIAGNOSIS — Z880 Allergy status to penicillin: Secondary | ICD-10-CM

## 2015-11-14 DIAGNOSIS — E039 Hypothyroidism, unspecified: Secondary | ICD-10-CM | POA: Diagnosis present

## 2015-11-14 DIAGNOSIS — M5116 Intervertebral disc disorders with radiculopathy, lumbar region: Secondary | ICD-10-CM | POA: Diagnosis present

## 2015-11-14 DIAGNOSIS — Z803 Family history of malignant neoplasm of breast: Secondary | ICD-10-CM | POA: Diagnosis not present

## 2015-11-14 DIAGNOSIS — M48062 Spinal stenosis, lumbar region with neurogenic claudication: Secondary | ICD-10-CM | POA: Diagnosis present

## 2015-11-14 DIAGNOSIS — Z79890 Hormone replacement therapy (postmenopausal): Secondary | ICD-10-CM

## 2015-11-14 DIAGNOSIS — M4317 Spondylolisthesis, lumbosacral region: Secondary | ICD-10-CM | POA: Diagnosis present

## 2015-11-14 DIAGNOSIS — M4807 Spinal stenosis, lumbosacral region: Secondary | ICD-10-CM | POA: Diagnosis present

## 2015-11-14 DIAGNOSIS — Z23 Encounter for immunization: Secondary | ICD-10-CM

## 2015-11-14 DIAGNOSIS — G4733 Obstructive sleep apnea (adult) (pediatric): Secondary | ICD-10-CM | POA: Diagnosis present

## 2015-11-14 DIAGNOSIS — Z6839 Body mass index (BMI) 39.0-39.9, adult: Secondary | ICD-10-CM | POA: Diagnosis not present

## 2015-11-14 DIAGNOSIS — Z885 Allergy status to narcotic agent status: Secondary | ICD-10-CM

## 2015-11-14 DIAGNOSIS — I2510 Atherosclerotic heart disease of native coronary artery without angina pectoris: Secondary | ICD-10-CM | POA: Diagnosis present

## 2015-11-14 DIAGNOSIS — M4727 Other spondylosis with radiculopathy, lumbosacral region: Secondary | ICD-10-CM | POA: Diagnosis present

## 2015-11-14 DIAGNOSIS — M5136 Other intervertebral disc degeneration, lumbar region: Secondary | ICD-10-CM | POA: Diagnosis present

## 2015-11-14 DIAGNOSIS — G2581 Restless legs syndrome: Secondary | ICD-10-CM | POA: Diagnosis present

## 2015-11-14 DIAGNOSIS — I10 Essential (primary) hypertension: Secondary | ICD-10-CM | POA: Diagnosis present

## 2015-11-14 DIAGNOSIS — Z7982 Long term (current) use of aspirin: Secondary | ICD-10-CM | POA: Diagnosis not present

## 2015-11-14 DIAGNOSIS — E119 Type 2 diabetes mellitus without complications: Secondary | ICD-10-CM | POA: Diagnosis present

## 2015-11-14 DIAGNOSIS — Z8249 Family history of ischemic heart disease and other diseases of the circulatory system: Secondary | ICD-10-CM

## 2015-11-14 DIAGNOSIS — E785 Hyperlipidemia, unspecified: Secondary | ICD-10-CM | POA: Diagnosis present

## 2015-11-14 DIAGNOSIS — M5117 Intervertebral disc disorders with radiculopathy, lumbosacral region: Secondary | ICD-10-CM | POA: Diagnosis present

## 2015-11-14 DIAGNOSIS — M62838 Other muscle spasm: Secondary | ICD-10-CM | POA: Diagnosis present

## 2015-11-14 DIAGNOSIS — F329 Major depressive disorder, single episode, unspecified: Secondary | ICD-10-CM | POA: Diagnosis present

## 2015-11-14 DIAGNOSIS — K219 Gastro-esophageal reflux disease without esophagitis: Secondary | ICD-10-CM | POA: Diagnosis present

## 2015-11-14 DIAGNOSIS — Z881 Allergy status to other antibiotic agents status: Secondary | ICD-10-CM

## 2015-11-14 DIAGNOSIS — G8929 Other chronic pain: Secondary | ICD-10-CM | POA: Diagnosis present

## 2015-11-14 DIAGNOSIS — G4700 Insomnia, unspecified: Secondary | ICD-10-CM | POA: Diagnosis present

## 2015-11-14 DIAGNOSIS — M51369 Other intervertebral disc degeneration, lumbar region without mention of lumbar back pain or lower extremity pain: Secondary | ICD-10-CM | POA: Diagnosis present

## 2015-11-14 LAB — POCT I-STAT 7, (LYTES, BLD GAS, ICA,H+H)
ACID-BASE EXCESS: 4 mmol/L — AB (ref 0.0–2.0)
BICARBONATE: 29.7 mmol/L — AB (ref 20.0–28.0)
Calcium, Ion: 1.18 mmol/L (ref 1.15–1.40)
HCT: 35 % — ABNORMAL LOW (ref 36.0–46.0)
HEMOGLOBIN: 11.9 g/dL — AB (ref 12.0–15.0)
O2 SAT: 100 %
PH ART: 7.412 (ref 7.350–7.450)
PO2 ART: 202 mmHg — AB (ref 83.0–108.0)
Patient temperature: 36.5
Potassium: 4.7 mmol/L (ref 3.5–5.1)
Sodium: 137 mmol/L (ref 135–145)
TCO2: 31 mmol/L (ref 0–100)
pCO2 arterial: 46.4 mmHg (ref 32.0–48.0)

## 2015-11-14 LAB — GLUCOSE, CAPILLARY
GLUCOSE-CAPILLARY: 204 mg/dL — AB (ref 65–99)
GLUCOSE-CAPILLARY: 135 mg/dL — AB (ref 65–99)
GLUCOSE-CAPILLARY: 159 mg/dL — AB (ref 65–99)
GLUCOSE-CAPILLARY: 139 mg/dL — AB (ref 65–99)
GLUCOSE-CAPILLARY: 143 mg/dL — AB (ref 65–99)
Glucose-Capillary: 110 mg/dL — ABNORMAL HIGH (ref 65–99)
Glucose-Capillary: 117 mg/dL — ABNORMAL HIGH (ref 65–99)
Glucose-Capillary: 128 mg/dL — ABNORMAL HIGH (ref 65–99)
Glucose-Capillary: 147 mg/dL — ABNORMAL HIGH (ref 65–99)

## 2015-11-14 SURGERY — POSTERIOR LUMBAR FUSION 3 LEVEL
Anesthesia: General

## 2015-11-14 MED ORDER — VANCOMYCIN HCL 1000 MG IV SOLR
INTRAVENOUS | Status: AC
  Filled 2015-11-14: qty 1000

## 2015-11-14 MED ORDER — BISACODYL 10 MG RE SUPP: 10.0000 mg | Freq: Every day | RECTAL | Status: DC | PRN

## 2015-11-14 MED ORDER — DOCUSATE SODIUM 100 MG PO CAPS
100.0000 mg | ORAL_CAPSULE | Freq: Two times a day (BID) | ORAL | Status: DC
  Administered 2015-11-14 – 2015-11-18 (×8): 100 mg via ORAL
  Filled 2015-11-14 (×8): qty 1

## 2015-11-14 MED ORDER — PANTOPRAZOLE SODIUM 40 MG PO TBEC
80.0000 mg | DELAYED_RELEASE_TABLET | Freq: Every day | ORAL | Status: DC
  Administered 2015-11-15 – 2015-11-18 (×4): 80 mg via ORAL
  Filled 2015-11-14 (×4): qty 2

## 2015-11-14 MED ORDER — PHENYLEPHRINE 40 MCG/ML (10ML) SYRINGE FOR IV PUSH (FOR BLOOD PRESSURE SUPPORT)
PREFILLED_SYRINGE | INTRAVENOUS | Status: DC | PRN
  Administered 2015-11-14: 80 ug via INTRAVENOUS
  Administered 2015-11-14: 120 ug via INTRAVENOUS

## 2015-11-14 MED ORDER — HYDROMORPHONE HCL 1 MG/ML IJ SOLN
INTRAMUSCULAR | Status: AC
  Filled 2015-11-14: qty 1

## 2015-11-14 MED ORDER — ROCURONIUM BROMIDE 10 MG/ML (PF) SYRINGE
PREFILLED_SYRINGE | INTRAVENOUS | Status: AC
  Filled 2015-11-14: qty 10

## 2015-11-14 MED ORDER — THROMBIN 20000 UNITS EX SOLR
CUTANEOUS | Status: AC
  Filled 2015-11-14: qty 20000

## 2015-11-14 MED ORDER — TAPENTADOL HCL 50 MG PO TABS
100.0000 mg | ORAL_TABLET | ORAL | Status: DC | PRN
Start: 2015-11-14 — End: 2015-11-18
  Administered 2015-11-15 – 2015-11-18 (×6): 100 mg via ORAL
  Filled 2015-11-14 (×8): qty 2

## 2015-11-14 MED ORDER — SODIUM CHLORIDE 0.9 % IV SOLN
INTRAVENOUS | Status: AC
  Administered 2015-11-14: 1.5 [IU]/h via INTRAVENOUS
  Filled 2015-11-14: qty 2.5

## 2015-11-14 MED ORDER — SODIUM CHLORIDE 0.9 % IR SOLN
Status: DC | PRN
  Administered 2015-11-14: 500 mL

## 2015-11-14 MED ORDER — METHOCARBAMOL 750 MG PO TABS
750.0000 mg | ORAL_TABLET | Freq: Three times a day (TID) | ORAL | Status: DC | PRN
  Administered 2015-11-14 – 2015-11-18 (×5): 750 mg via ORAL
  Filled 2015-11-14 (×5): qty 1

## 2015-11-14 MED ORDER — SUGAMMADEX SODIUM 200 MG/2ML IV SOLN
INTRAVENOUS | Status: DC | PRN
  Administered 2015-11-14: 200.4 mg via INTRAVENOUS

## 2015-11-14 MED ORDER — HYDROXYZINE HCL 25 MG PO TABS
50.0000 mg | ORAL_TABLET | Freq: Two times a day (BID) | ORAL | Status: DC
  Administered 2015-11-14 – 2015-11-18 (×8): 50 mg via ORAL
  Filled 2015-11-14 (×8): qty 2

## 2015-11-14 MED ORDER — ROPINIROLE HCL 1 MG PO TABS
5.0000 mg | ORAL_TABLET | Freq: Two times a day (BID) | ORAL | Status: DC
  Administered 2015-11-14 – 2015-11-18 (×8): 5 mg via ORAL
  Filled 2015-11-14 (×8): qty 5

## 2015-11-14 MED ORDER — BACITRACIN ZINC 500 UNIT/GM EX OINT
TOPICAL_OINTMENT | CUTANEOUS | Status: AC
  Filled 2015-11-14: qty 28.35

## 2015-11-14 MED ORDER — BACITRACIN ZINC 500 UNIT/GM EX OINT
TOPICAL_OINTMENT | CUTANEOUS | Status: DC | PRN
  Administered 2015-11-14: 1 via TOPICAL

## 2015-11-14 MED ORDER — THROMBIN 20000 UNITS EX SOLR
CUTANEOUS | Status: DC | PRN
  Administered 2015-11-14 (×2): via TOPICAL

## 2015-11-14 MED ORDER — ARTIFICIAL TEARS OP OINT
TOPICAL_OINTMENT | OPHTHALMIC | Status: DC | PRN
  Administered 2015-11-14: 1 via OPHTHALMIC

## 2015-11-14 MED ORDER — ALUM & MAG HYDROXIDE-SIMETH 200-200-20 MG/5ML PO SUSP: 30.0000 mL | Freq: Four times a day (QID) | ORAL | Status: DC | PRN

## 2015-11-14 MED ORDER — BUPIVACAINE-EPINEPHRINE (PF) 0.5% -1:200000 IJ SOLN
INTRAMUSCULAR | Status: AC
  Filled 2015-11-14: qty 30

## 2015-11-14 MED ORDER — FENTANYL CITRATE (PF) 100 MCG/2ML IJ SOLN
INTRAMUSCULAR | Status: AC
  Filled 2015-11-14: qty 2

## 2015-11-14 MED ORDER — INSULIN GLARGINE 100 UNIT/ML ~~LOC~~ SOLN
32.0000 [IU] | Freq: Every morning | SUBCUTANEOUS | Status: DC
  Administered 2015-11-15 – 2015-11-18 (×4): 32 [IU] via SUBCUTANEOUS
  Filled 2015-11-14 (×4): qty 0.32

## 2015-11-14 MED ORDER — INSULIN GLARGINE 100 UNIT/ML ~~LOC~~ SOLN
16.0000 [IU] | SUBCUTANEOUS | Status: AC
  Administered 2015-11-14: 16 [IU] via SUBCUTANEOUS
  Filled 2015-11-14: qty 0.16

## 2015-11-14 MED ORDER — LACTATED RINGERS IV SOLN
INTRAVENOUS | Status: DC | PRN
Start: 2015-11-14 — End: 2015-11-14
  Administered 2015-11-14: 11:00:00 via INTRAVENOUS

## 2015-11-14 MED ORDER — ONDANSETRON HCL 4 MG/2ML IJ SOLN: 4.0000 mg | INTRAMUSCULAR | Status: DC | PRN

## 2015-11-14 MED ORDER — INSULIN ASPART 100 UNIT/ML ~~LOC~~ SOLN
0.0000 [IU] | SUBCUTANEOUS | Status: DC
  Administered 2015-11-14 – 2015-11-16 (×6): 3 [IU] via SUBCUTANEOUS
  Administered 2015-11-17: 4 [IU] via SUBCUTANEOUS
  Administered 2015-11-18: 3 [IU] via SUBCUTANEOUS
  Administered 2015-11-18: 4 [IU] via SUBCUTANEOUS
  Administered 2015-11-18: 1 [IU] via SUBCUTANEOUS

## 2015-11-14 MED ORDER — MENTHOL 3 MG MT LOZG: 1.0000 | LOZENGE | OROMUCOSAL | Status: DC | PRN

## 2015-11-14 MED ORDER — TRAMADOL HCL 50 MG PO TABS
ORAL_TABLET | ORAL | Status: AC
  Filled 2015-11-14: qty 1

## 2015-11-14 MED ORDER — EST ESTROGENS-METHYLTEST 0.625-1.25 MG PO TABS: 1.0000 | ORAL_TABLET | Freq: Every day | ORAL | Status: DC

## 2015-11-14 MED ORDER — TEMAZEPAM 15 MG PO CAPS
30.0000 mg | ORAL_CAPSULE | Freq: Every evening | ORAL | Status: DC | PRN
  Administered 2015-11-17: 30 mg via ORAL
  Filled 2015-11-14: qty 2

## 2015-11-14 MED ORDER — ACETAMINOPHEN 650 MG RE SUPP: 650.0000 mg | RECTAL | Status: DC | PRN

## 2015-11-14 MED ORDER — LACTATED RINGERS IV SOLN
INTRAVENOUS | Status: DC
  Administered 2015-11-14: 22:00:00 via INTRAVENOUS

## 2015-11-14 MED ORDER — PROPOFOL 10 MG/ML IV BOLUS
INTRAVENOUS | Status: DC | PRN
  Administered 2015-11-14: 120 mg via INTRAVENOUS

## 2015-11-14 MED ORDER — MIDAZOLAM HCL 2 MG/2ML IJ SOLN
INTRAMUSCULAR | Status: AC
  Filled 2015-11-14: qty 2

## 2015-11-14 MED ORDER — ROCURONIUM BROMIDE 100 MG/10ML IV SOLN
INTRAVENOUS | Status: DC | PRN
  Administered 2015-11-14 (×2): 10 mg via INTRAVENOUS
  Administered 2015-11-14: 40 mg via INTRAVENOUS
  Administered 2015-11-14: 60 mg via INTRAVENOUS

## 2015-11-14 MED ORDER — MORPHINE SULFATE (PF) 2 MG/ML IV SOLN
1.0000 mg | INTRAVENOUS | Status: DC | PRN
  Administered 2015-11-14 – 2015-11-15 (×4): 2 mg via INTRAVENOUS
  Filled 2015-11-14: qty 2
  Filled 2015-11-14 (×3): qty 1

## 2015-11-14 MED ORDER — 0.9 % SODIUM CHLORIDE (POUR BTL) OPTIME
TOPICAL | Status: DC | PRN
  Administered 2015-11-14: 1000 mL

## 2015-11-14 MED ORDER — ONDANSETRON HCL 4 MG/2ML IJ SOLN
INTRAMUSCULAR | Status: AC
  Filled 2015-11-14: qty 2

## 2015-11-14 MED ORDER — ONDANSETRON HCL 4 MG/2ML IJ SOLN
INTRAMUSCULAR | Status: DC | PRN
  Administered 2015-11-14: 4 mg via INTRAVENOUS

## 2015-11-14 MED ORDER — PHENOL 1.4 % MT LIQD
1.0000 | OROMUCOSAL | Status: DC | PRN
Start: 2015-11-14 — End: 2015-11-18

## 2015-11-14 MED ORDER — ARTIFICIAL TEARS OP OINT
TOPICAL_OINTMENT | OPHTHALMIC | Status: AC
  Filled 2015-11-14: qty 3.5

## 2015-11-14 MED ORDER — SUGAMMADEX SODIUM 200 MG/2ML IV SOLN
INTRAVENOUS | Status: AC
  Filled 2015-11-14: qty 2

## 2015-11-14 MED ORDER — LIDOCAINE 2% (20 MG/ML) 5 ML SYRINGE
INTRAMUSCULAR | Status: DC | PRN
  Administered 2015-11-14: 60 mg via INTRAVENOUS
  Administered 2015-11-14 (×2): 20 mg via INTRAVENOUS

## 2015-11-14 MED ORDER — PHENYLEPHRINE HCL 10 MG/ML IJ SOLN
INTRAVENOUS | Status: DC | PRN
  Administered 2015-11-14: 50 ug/min via INTRAVENOUS

## 2015-11-14 MED ORDER — VANCOMYCIN HCL IN DEXTROSE 1-5 GM/200ML-% IV SOLN
1000.0000 mg | Freq: Once | INTRAVENOUS | Status: AC
  Administered 2015-11-15: 1000 mg via INTRAVENOUS
  Filled 2015-11-14: qty 200

## 2015-11-14 MED ORDER — LISINOPRIL 20 MG PO TABS
20.0000 mg | ORAL_TABLET | Freq: Every day | ORAL | Status: DC
  Administered 2015-11-15: 20 mg via ORAL
  Filled 2015-11-14 (×4): qty 1

## 2015-11-14 MED ORDER — GABAPENTIN 300 MG PO CAPS
300.0000 mg | ORAL_CAPSULE | Freq: Three times a day (TID) | ORAL | Status: DC
  Administered 2015-11-14 – 2015-11-18 (×10): 300 mg via ORAL
  Filled 2015-11-14 (×10): qty 1

## 2015-11-14 MED ORDER — BUPIVACAINE-EPINEPHRINE (PF) 0.5% -1:200000 IJ SOLN
INTRAMUSCULAR | Status: DC | PRN
  Administered 2015-11-14: 10 mL via PERINEURAL

## 2015-11-14 MED ORDER — TRAMADOL HCL 50 MG PO TABS
50.0000 mg | ORAL_TABLET | Freq: Three times a day (TID) | ORAL | Status: DC | PRN
  Administered 2015-11-14 – 2015-11-18 (×5): 50 mg via ORAL
  Filled 2015-11-14 (×4): qty 1

## 2015-11-14 MED ORDER — HYDROMORPHONE HCL 1 MG/ML IJ SOLN
0.2500 mg | INTRAMUSCULAR | Status: DC | PRN
  Administered 2015-11-14 (×4): 0.5 mg via INTRAVENOUS

## 2015-11-14 MED ORDER — LEVOTHYROXINE SODIUM 75 MCG PO TABS
75.0000 ug | ORAL_TABLET | Freq: Every day | ORAL | Status: DC
  Administered 2015-11-15 – 2015-11-18 (×4): 75 ug via ORAL
  Filled 2015-11-14 (×4): qty 1

## 2015-11-14 MED ORDER — ACETAMINOPHEN 325 MG PO TABS: 650.0000 mg | ORAL_TABLET | ORAL | Status: DC | PRN

## 2015-11-14 MED ORDER — DIAZEPAM 5 MG PO TABS
5.0000 mg | ORAL_TABLET | Freq: Four times a day (QID) | ORAL | Status: DC | PRN
  Administered 2015-11-14 – 2015-11-17 (×4): 5 mg via ORAL
  Filled 2015-11-14 (×3): qty 1

## 2015-11-14 MED ORDER — DIAZEPAM 5 MG PO TABS
ORAL_TABLET | ORAL | Status: AC
  Filled 2015-11-14: qty 1

## 2015-11-14 MED ORDER — BUPIVACAINE LIPOSOME 1.3 % IJ SUSP
20.0000 mL | INTRAMUSCULAR | Status: DC
  Filled 2015-11-14: qty 20

## 2015-11-14 MED ORDER — FUROSEMIDE 20 MG PO TABS
20.0000 mg | ORAL_TABLET | Freq: Every day | ORAL | Status: DC
  Administered 2015-11-15 – 2015-11-18 (×3): 20 mg via ORAL
  Filled 2015-11-14 (×4): qty 1

## 2015-11-14 MED ORDER — DULOXETINE HCL 60 MG PO CPEP
60.0000 mg | ORAL_CAPSULE | Freq: Every day | ORAL | Status: DC
  Administered 2015-11-15 – 2015-11-18 (×4): 60 mg via ORAL
  Filled 2015-11-14 (×4): qty 1

## 2015-11-14 MED ORDER — LIDOCAINE 2% (20 MG/ML) 5 ML SYRINGE
INTRAMUSCULAR | Status: AC
  Filled 2015-11-14: qty 5

## 2015-11-14 MED ORDER — FENTANYL CITRATE (PF) 100 MCG/2ML IJ SOLN
INTRAMUSCULAR | Status: DC | PRN
  Administered 2015-11-14: 100 ug via INTRAVENOUS
  Administered 2015-11-14: 50 ug via INTRAVENOUS
  Administered 2015-11-14: 100 ug via INTRAVENOUS
  Administered 2015-11-14: 50 ug via INTRAVENOUS
  Administered 2015-11-14: 100 ug via INTRAVENOUS

## 2015-11-14 MED ORDER — FENTANYL CITRATE (PF) 100 MCG/2ML IJ SOLN
INTRAMUSCULAR | Status: AC
  Filled 2015-11-14: qty 4

## 2015-11-14 MED ORDER — METOPROLOL SUCCINATE ER 25 MG PO TB24
25.0000 mg | ORAL_TABLET | Freq: Every day | ORAL | Status: DC
  Administered 2015-11-15 – 2015-11-18 (×2): 25 mg via ORAL
  Filled 2015-11-14 (×4): qty 1

## 2015-11-14 MED ORDER — PROPOFOL 10 MG/ML IV BOLUS
INTRAVENOUS | Status: AC
Start: 2015-11-14 — End: 2015-11-14
  Filled 2015-11-14: qty 20

## 2015-11-14 MED ORDER — THROMBIN 5000 UNITS EX SOLR
CUTANEOUS | Status: AC
  Filled 2015-11-14: qty 5000

## 2015-11-14 MED ORDER — LACTATED RINGERS IV SOLN
INTRAVENOUS | Status: DC
  Administered 2015-11-14 (×3): via INTRAVENOUS

## 2015-11-14 SURGICAL SUPPLY — 62 items
BAG DECANTER FOR FLEXI CONT (MISCELLANEOUS) ×2 IMPLANT
BENZOIN TINCTURE PRP APPL 2/3 (GAUZE/BANDAGES/DRESSINGS) ×2 IMPLANT
BLADE CLIPPER SURG (BLADE) IMPLANT
BUR MATCHSTICK NEURO 3.0 LAGG (BURR) ×2 IMPLANT
BUR PRECISION FLUTE 6.0 (BURR) ×2 IMPLANT
BUR RND OSTEON ELITE 6.0 (BURR) ×2 IMPLANT
CANISTER SUCT 3000ML PPV (MISCELLANEOUS) ×2 IMPLANT
CAP REVERE LOCKING (Cap) ×16 IMPLANT
CONT SPEC 4OZ CLIKSEAL STRL BL (MISCELLANEOUS) ×2 IMPLANT
COVER BACK TABLE 60X90IN (DRAPES) IMPLANT
COVER TABLE BACK 60X90 (DRAPES) ×2 IMPLANT
DRAPE C-ARM 42X72 X-RAY (DRAPES) ×4 IMPLANT
DRAPE HALF SHEET 40X57 (DRAPES) ×2 IMPLANT
DRAPE LAPAROTOMY 100X72X124 (DRAPES) ×2 IMPLANT
DRAPE POUCH INSTRU U-SHP 10X18 (DRAPES) ×2 IMPLANT
DRAPE SURG 17X23 STRL (DRAPES) ×8 IMPLANT
ELECT BLADE 4.0 EZ CLEAN MEGAD (MISCELLANEOUS) ×2
ELECT REM PT RETURN 9FT ADLT (ELECTROSURGICAL) ×2
ELECTRODE BLDE 4.0 EZ CLN MEGD (MISCELLANEOUS) ×1 IMPLANT
ELECTRODE REM PT RTRN 9FT ADLT (ELECTROSURGICAL) ×1 IMPLANT
GAUZE SPONGE 4X4 12PLY STRL (GAUZE/BANDAGES/DRESSINGS) ×2 IMPLANT
GAUZE SPONGE 4X4 16PLY XRAY LF (GAUZE/BANDAGES/DRESSINGS) ×4 IMPLANT
GLOVE BIO SURGEON STRL SZ8 (GLOVE) ×4 IMPLANT
GLOVE BIO SURGEON STRL SZ8.5 (GLOVE) ×4 IMPLANT
GLOVE ECLIPSE 6.5 STRL STRAW (GLOVE) ×2 IMPLANT
GLOVE EXAM NITRILE LRG STRL (GLOVE) IMPLANT
GLOVE EXAM NITRILE XL STR (GLOVE) IMPLANT
GLOVE EXAM NITRILE XS STR PU (GLOVE) IMPLANT
GLOVE INDICATOR 7.0 STRL GRN (GLOVE) ×2 IMPLANT
GOWN STRL REUS W/ TWL LRG LVL3 (GOWN DISPOSABLE) IMPLANT
GOWN STRL REUS W/ TWL XL LVL3 (GOWN DISPOSABLE) ×2 IMPLANT
GOWN STRL REUS W/TWL 2XL LVL3 (GOWN DISPOSABLE) IMPLANT
GOWN STRL REUS W/TWL LRG LVL3 (GOWN DISPOSABLE)
GOWN STRL REUS W/TWL XL LVL3 (GOWN DISPOSABLE) ×2
KIT BASIN OR (CUSTOM PROCEDURE TRAY) ×2 IMPLANT
KIT ROOM TURNOVER OR (KITS) ×2 IMPLANT
MILL MEDIUM DISP (BLADE) ×2 IMPLANT
NEEDLE HYPO 21X1.5 SAFETY (NEEDLE) ×2 IMPLANT
NEEDLE HYPO 22GX1.5 SAFETY (NEEDLE) ×2 IMPLANT
NS IRRIG 1000ML POUR BTL (IV SOLUTION) ×2 IMPLANT
PACK LAMINECTOMY NEURO (CUSTOM PROCEDURE TRAY) ×2 IMPLANT
PAD ARMBOARD 7.5X6 YLW CONV (MISCELLANEOUS) ×6 IMPLANT
PATTIES SURGICAL .5 X.5 (GAUZE/BANDAGES/DRESSINGS) ×4 IMPLANT
PATTIES SURGICAL .5 X1 (DISPOSABLE) IMPLANT
PATTIES SURGICAL 1X1 (DISPOSABLE) ×4 IMPLANT
ROD CURVED REVERE 6.35 95MM (Rod) ×4 IMPLANT
SCREW REVERE 6.35 6.5MMX45 (Screw) ×16 IMPLANT
SPACER ALTERA 10X31 8-12MM-8 (Spacer) ×6 IMPLANT
SPONGE LAP 4X18 X RAY DECT (DISPOSABLE) IMPLANT
SPONGE NEURO XRAY DETECT 1X3 (DISPOSABLE) IMPLANT
SPONGE SURGIFOAM ABS GEL 100 (HEMOSTASIS) ×4 IMPLANT
STRIP BIOACTIVE 20CC 25X100X8 (Miscellaneous) ×4 IMPLANT
STRIP CLOSURE SKIN 1/2X4 (GAUZE/BANDAGES/DRESSINGS) ×2 IMPLANT
SUT VIC AB 1 CT1 18XBRD ANBCTR (SUTURE) ×2 IMPLANT
SUT VIC AB 1 CT1 8-18 (SUTURE) ×2
SUT VIC AB 2-0 CP2 18 (SUTURE) ×4 IMPLANT
SYRINGE 20CC LL (MISCELLANEOUS) ×2 IMPLANT
TAPE CLOTH SURG 6X10 WHT LF (GAUZE/BANDAGES/DRESSINGS) ×2 IMPLANT
TOWEL OR 17X24 6PK STRL BLUE (TOWEL DISPOSABLE) ×2 IMPLANT
TOWEL OR 17X26 10 PK STRL BLUE (TOWEL DISPOSABLE) ×2 IMPLANT
TRAY FOLEY W/METER SILVER 16FR (SET/KITS/TRAYS/PACK) ×2 IMPLANT
WATER STERILE IRR 1000ML POUR (IV SOLUTION) ×2 IMPLANT

## 2015-11-14 NOTE — Progress Notes (Signed)
Patient on insulin drip CBG 147 rate increased to 2.6

## 2015-11-14 NOTE — Op Note (Signed)
Brief history: The patient is a 70 year old white female who has complained of back and leg pain consistent with a lumbar radiculopathy. She has failed medical management and was worked up with a lumbar MRI, a lumbar myelo CT, and lumbar x-rays. This demonstrated degenerative disease, facet arthropathy, etc. at L3-4, L4-5 and L5-S1. I discussed the various treatment options with the patient including surgery. She has weighed the risks, benefits, and alternatives to surgery and decided to proceed with an L3-4, L4-5 and L5-S1 decompression, instrumentation, and fusion.  Preoperative diagnosis: L3-4, L4-5 and L5-S1 facet arthropathy; lumbago; lumbar radiculopathy  Postoperative diagnosis: The same   Procedure: Bilateral L3-4, L4-5 and L5-S1 Laminotomy/foraminotomies to decompress the bilateral L3, L4, L5 and S1 nerve roots; L3-4, L4-5 and L5-S1 transforaminal lumbar interbody fusion with local morselized autograft bone and Kinnex graft extender; insertion of interbody prosthesis at L3-4, L4-5 and L5-S1 (globus peek expandable interbody prosthesis); posterior segmental instrumentation from L3 to S1 with globus titanium pedicle screws and rods; posterior lateral arthrodesis at L3-4, L4-5 and L5-S1 with local morselized autograft bone and Kinnex bone graft extender.  Surgeon: Dr. Earle Gell  Asst.: Dr. [place] Bailiff  Anesthesia: Gen. endotracheal  Estimated blood loss: 300 mL  Drains: None  Complications: None  Description of procedure: The patient was brought to the operating room by the anesthesia team. General endotracheal anesthesia was induced. The patient was turned to the prone position on the Wilson frame. The patient's lumbosacral region was then prepared with Betadine scrub and Betadine solution. Sterile drapes were applied.  I then injected the area to be incised with Marcaine with epinephrine solution. I then used the scalpel to make a linear midline incision over the L3-4, L4-5 and L5-S1  interspace. I then used electrocautery to perform a bilateral subperiosteal dissection exposing the spinous process and lamina of L3, L4, L5 and the upper sacrum. We then obtained intraoperative radiograph to confirm our location. We then inserted the Verstrac retractor to provide exposure.  I began the decompression by using the high speed drill to perform laminotomies at L3-4, L4-5 and L5-S1 bilaterally. We then used the Kerrison punches to widen the laminotomy and removed the ligamentum flavum at L3-4, L4-5 and L5-S1 bilaterally. We used the Kerrison punches to remove the medial facets at L3-4, L4-5 and L5-S1 bilaterally. We performed wide foraminotomies about the bilateral L3, L4, L5 and S1 nerve roots completing the decompression.  We now turned our attention to the posterior lumbar interbody fusion. I used a scalpel to incise the intervertebral disc at L3-4, L4-5 and L5-S1 bilaterally. I then performed a partial intervertebral discectomy at L3-4, L4-5, and L5-S1 bilaterally using the pituitary forceps. We prepared the vertebral endplates at [id number], [id number]-GRM and L5-S1 bilaterally for the fusion by removing the soft tissues with the curettes. We then used the trial spacers to pick the appropriate sized interbody prosthesis. We prefilled his prosthesis with a combination of local morselized autograft bone that we obtained during the decompression as well as Kinnex bone graft extender. We inserted the prefilled prosthesis into the interspace at L3-4, L4-5 and L5-S1 from the right, we then expanded the prosthesis.. There was a good snug fit of the prosthesis in the interspace. We then filled and the remainder of the intervertebral disc space with local morselized autograft bone and Kinnex. This completed the posterior lumbar interbody arthrodesis.  We now turned attention to the instrumentation. Under fluoroscopic guidance we cannulated the bilateral L3, L4, L5 and S1 pedicles with the bone  probe. We then removed  the bone probe. We then tapped the pedicle with a 5.5 millimeter tap. We then removed the tap. We probed inside the tapped pedicle with a ball probe to rule out cortical breaches. We then inserted a 6.5 x 40 and 45 millimeter pedicle screw into the L3, L4, L5 and S1 pedicles bilaterally under fluoroscopic guidance. We then palpated along the medial aspect of the pedicles to rule out cortical breaches. There were none. The nerve roots were not injured. We then connected the unilateral pedicle screws with a lordotic rod. We compressed the construct and secured the rod in place with the caps. We then tightened the caps appropriately. This completed the instrumentation from L3-S1.  We now turned our attention to the posterior lateral arthrodesis at L3-4, L4-5 and L5-S1 bilaterally. We used the high-speed drill to decorticate the remainder of the facets, pars, transverse process at L3-4, L4-5 and L5-S1. We then applied a combination of local morselized autograft bone and Kinnex bone graft extender over these decorticated posterior lateral structures. This completed the posterior lateral arthrodesis.  We then obtained hemostasis using bipolar electrocautery. We irrigated the wound out with bacitracin solution. We inspected the thecal sac and nerve roots and noted they were well decompressed. We then removed the retractor. We placed vancomycin powder in the wound. We reapproximated patient's thoracolumbar fascia with interrupted #1 Vicryl suture. We reapproximated patient's subcutaneous tissue with interrupted 2-0 Vicryl suture. The reapproximated patient's skin with Steri-Strips and benzoin. The wound was then coated with bacitracin ointment. A sterile dressing was applied. The drapes were removed. The patient was subsequently returned to the supine position where they were extubated by the anesthesia team. He was then transported to the post anesthesia care unit in stable condition. All sponge instrument and needle  counts were reportedly correct at the end of this case.

## 2015-11-14 NOTE — Transfer of Care (Signed)
Immediate Anesthesia Transfer of Care Note  Patient: [name]  Procedure(s) Performed: Procedure(s) with comments: DECOMPRESSION LAMINECTOMY LUMBAR THREE- LUMBAR FOUR, LUMBAR FOUR-LUMBAR FIVE, LUMBAR FIVE-SACRAL ONE ISTRUMENTATION WITH PEDICLE SCREWS, RODS, PLACEMENT OF INTERBODY PROSTHESIS, POSTERIOR LATERAL ARTHRODESIS (N/A) - DECOMPRESSION LAMINECTOMY L3-L4, L4-L5, L5-S1 ISTRUMENTATION WITH PEDICLE SCREWS/RODS, PLACEMENT OF INTERBODY PROSTHESIS, POSTERIOR LATERAL ARTHRODESIS  Patient Location: PACU  Anesthesia Type:General  Level of Consciousness: sedated  Airway & Oxygen Therapy: Patient Spontanous Breathing and Patient connected to face mask oxygen  Post-op Assessment: Report given to RN and Post -op Vital signs reviewed and stable  Post vital signs: Reviewed and stable  Last Vitals:  Vitals:   11/14/15 0723 11/14/15 1538  BP: (!) 112/43 (!) 153/78  Pulse: 84 (!) 109  Resp: 18 18  Temp: 37 C 36.4 C    Last Pain:  Vitals:   11/14/15 0815  PainSc: 4       Patients Stated Pain Goal: 7 ([id number])  Complications: No apparent anesthesia complications

## 2015-11-14 NOTE — Anesthesia Postprocedure Evaluation (Signed)
Anesthesia Post Note  Patient: LOWEN FELLAND  Procedure(s) Performed: Procedure(s) (LRB): DECOMPRESSION LAMINECTOMY LUMBAR THREE- LUMBAR FOUR, LUMBAR FOUR-LUMBAR FIVE, LUMBAR FIVE-SACRAL ONE ISTRUMENTATION WITH PEDICLE SCREWS, RODS, PLACEMENT OF INTERBODY PROSTHESIS, POSTERIOR LATERAL ARTHRODESIS (N/A)  Patient location during evaluation: PACU Anesthesia Type: General Level of consciousness: awake and alert Pain management: pain level controlled Vital Signs Assessment: post-procedure vital signs reviewed and stable Respiratory status: spontaneous breathing, nonlabored ventilation, respiratory function stable and patient connected to nasal cannula oxygen Cardiovascular status: blood pressure returned to baseline and stable Postop Assessment: no signs of nausea or vomiting Anesthetic complications: no    Last Vitals:  Vitals:   11/14/15 1630 11/14/15 1645  BP: (!) (P) 142/77 (!) (P) 142/59  Pulse:    Resp:    Temp:      Last Pain:  Vitals:   11/14/15 0815  PainSc: 4                  Alyan Hartline,W. EDMOND

## 2015-11-14 NOTE — Anesthesia Procedure Notes (Signed)
Procedure Name: Intubation Date/Time: 11/14/2015 9:48 AM Performed by: Trixie Deis A Pre-anesthesia Checklist: Patient identified, Emergency Drugs available, Suction available, Patient being monitored and Timeout performed Patient Re-evaluated:Patient Re-evaluated prior to inductionOxygen Delivery Method: Circle System Utilized Preoxygenation: Pre-oxygenation with 100% oxygen Intubation Type: IV induction Ventilation: Mask ventilation without difficulty Laryngoscope Size: Glidescope and 3 Grade View: Grade I Tube type: Oral Tube size: 7.0 mm Number of attempts: 1 Airway Equipment and Method: Stylet and Oral airway Placement Confirmation: ETT inserted through vocal cords under direct vision,  positive ETCO2 and breath sounds checked- equal and bilateral Secured at: 21 cm Tube secured with: Tape Dental Injury: Teeth and Oropharynx as per pre-operative assessment  Comments: Used glidescope due to small mouth opening, large tongue, morbid obesity.

## 2015-11-14 NOTE — Progress Notes (Signed)
Pt admitted to the unit from pacu with IV intact and transfusing. Pt responsive to verbal questions; foley intact and unclamped; back incision has clean dry and intact gauze dsg with no stain or active bleeding noted; pt oriented to the unit and room; call light within reach and family at bedside; VSS; skin intact with no pressure ulcer noted except for back incision dsg. Reported off to oncoming RN. Delia Heady RN

## 2015-11-14 NOTE — Progress Notes (Signed)
Pharmacy Antibiotic Note  [name] is a 70 y.o. female admitted on 11/14/2015 for  L3-4, L4-5 and L5-S1 decompression, instrumentation and fusion.[name]  Pharmacy has been consulted for Vancomycin IV  Dosing for surgical prophylaxis for 1 dose 12 hours post-op unless patient has a drain, then continue vancomycin until discontinued by physician.  Preop Vancomycin 1500 mg IV x1 given today 10/2 @ 09:26 SCr 1.10 preop labs on 11/04/15,  Estimate CrCl ~ 50 ml/min  Currently afebrile No drain placed, thus will give only one dose of Vancomycin 12 hrs post op.  Plan:  Vancomycin 1g IV x1, give 12 hours post op. Pharmacy will sign off.   Weight: 221 lb (100.2 kg)  Temp (24hrs), Avg:98.1 F (36.7 C), Min:97.5 F (36.4 C), Max:98.7 F (37.1 C)  No results for input(s): WBC, CREATININE, LATICACIDVEN, VANCOTROUGH, VANCOPEAK, VANCORANDOM, GENTTROUGH, GENTPEAK, GENTRANDOM, TOBRATROUGH, TOBRAPEAK, TOBRARND, AMIKACINPEAK, AMIKACINTROU, AMIKACIN in the last 168 hours.  Estimated Creatinine Clearance: 53.7 mL/min (by C-G formula based on SCr of 1.1 mg/dL (H)).    Allergies  Allergen Reactions  . Ceftin [Cefuroxime Axetil] Diarrhea    diarrhea  . Codeine Rash       . Penicillins Rash  . Vicodin [Hydrocodone-Acetaminophen] Rash     Also passes out    Antimicrobials this admission: Vancomycin 10/2 x1 dose post op  Dose adjustments this admission: n/a  Microbiology results: none ordered  Thank you for allowing pharmacy to be a part of this patient's care.  Nicole Cella, RPh Clinical Pharmacist Pager: [phone] 11/14/2015 6:42 PM

## 2015-11-14 NOTE — H&P (Signed)
Subjective: The patient is a 70 year old white female who has complained of back and leg pain consistent with a lumbar radiculopathy. She has failed medical management and was worked up with a lumbar MRI and x-rays. This demonstrated lumbar stenosis, spinal listhesis, etc. I have discussed the various treatment options with the patient including surgery. She has weighed the risks, benefits, and alternatives to surgery and decided proceed with an L3-4, L4-5 and L5-S1 decompression, instrumentation and fusion.   Past Medical History:  Diagnosis Date  . Arthritis   . Chronic back pain    stenosis.degenerative disc,some scoliosis  . Constipation    takes Stool Softener daily  . Coronary artery disease   . Depression    takes Cymbalta daily  . Diabetes mellitus    Type 2 diabetic. Average fasting blood sugar runs high 170-200  . Diverticulosis   . GERD (gastroesophageal reflux disease)    takes Nexium daily  . Hemorrhoids   . History of colon polyps    benign  . History of gout    doesn't take any meds  . History of vertigo    doesn't take any meds  . Hyperlipidemia    takes Praluent daily  . Hypertension    takes Imdur,Lisinopril,and Metoprolol daily  . Hypothyroidism    takes Synthroid daily  . Insomnia    takes Restoril nightly  . Joint pain   . Joint swelling   . Muscle spasm    takes Robaxin as needed  . OSA on CPAP   . Peripheral edema    takes LAsix as needed  . Restless leg    takes Requip daily  . Rosacea   . Sleep apnea   . Varicose veins   . Weakness    numbness and tingling.    Past Surgical History:  Procedure Laterality Date  . ABDOMINAL HYSTERECTOMY     with BSo  . CARDIAC CATHETERIZATION  2013   Normal  . CARPAL TUNNEL RELEASE Bilateral   . CATARACT EXTRACTION, BILATERAL    . CHOLECYSTECTOMY    . COLONOSCOPY    . DIAGNOSTIC LAPAROSCOPY     multiple times  . DILATION AND CURETTAGE OF UTERUS    . ESOPHAGOGASTRODUODENOSCOPY (EGD) WITH PROPOFOL  N/A 11/01/2014   Procedure: ESOPHAGOGASTRODUODENOSCOPY (EGD) WITH PROPOFOL;  Surgeon: Hulen Luster, MD;  Location: [facility] ENDOSCOPY;  Service: Gastroenterology;  Laterality: N/A;  . IMPLANTABLE CONTACT LENS IMPLANTATION     bilateral  . ROTATOR CUFF REPAIR Left   . SAVORY DILATION N/A 11/01/2014   Procedure: SAVORY DILATION;  Surgeon: Hulen Luster, MD;  Location: [facility] ENDOSCOPY;  Service: Gastroenterology;  Laterality: N/A;  . TONSILLECTOMY    . TRIGGER FINGER RELEASE Bilateral     Allergies  Allergen Reactions  . Ceftin [Cefuroxime Axetil] Diarrhea    diarrhea  . Codeine Rash       . Penicillins Rash  . Vicodin [Hydrocodone-Acetaminophen] Rash     Also passes out    Social History  Substance Use Topics  . Smoking status: Never Smoker  . Smokeless tobacco: Never Used  . Alcohol use No    Family History  Problem Relation Age of Onset  . Heart disease Mother   . Breast cancer Mother 27  . Heart disease Father   . Heart attack Sister   . Heart disease Sister   . Heart disease Brother    Prior to Admission medications   Medication Sig Start Date End Date Taking? Authorizing Provider  Alirocumab (  PRALUENT) 150 MG/ML SOPN Inject 1 pen into the skin every 14 (fourteen) days. 09/07/14  Yes Minna Merritts, MD  aspirin 81 MG tablet Take 81 mg by mouth daily.     Yes Historical Provider, MD  cholecalciferol (VITAMIN D) 1000 UNITS tablet Take 1,000 Units by mouth daily.   Yes Historical Provider, MD  docusate sodium (COLACE) 100 MG capsule Take 100 mg by mouth daily.   Yes Historical Provider, MD  DULoxetine (CYMBALTA) 60 MG capsule Take 60 mg by mouth daily.   Yes Historical Provider, MD  esomeprazole (NEXIUM) 40 MG capsule Take 40 mg by mouth 3 (three) times daily.    Yes Historical Provider, MD  estrogen-methylTESTOSTERone 0.625-1.25 MG per tablet Take 1 tablet by mouth daily.   Yes Historical Provider, MD  furosemide (LASIX) 20 MG tablet Take 20 mg by mouth daily.   Yes Historical  Provider, MD  gabapentin (NEURONTIN) 300 MG capsule Take 300 mg by mouth 4 (four) times daily. Take 300 mg tablets 3-4 times daily.   Yes Historical Provider, MD  hydrOXYzine (ATARAX/VISTARIL) 50 MG tablet Take 50 mg by mouth 2 (two) times daily.  05/26/12  Yes Historical Provider, MD  insulin aspart (NOVOLOG) 100 UNIT/ML FlexPen Inject 10 Units into the skin See admin instructions. 10 units before breakfast, 10 units before lunch, 16 units at dinner, >200 increase by 2 units per sliding scale 03/01/15 02/29/16 Yes Historical Provider, MD  LANTUS SOLOSTAR 100 UNIT/ML Solostar Pen 32 units in am 02/23/15  Yes Historical Provider, MD  levothyroxine (SYNTHROID, LEVOTHROID) 75 MCG tablet Take 75 mcg by mouth daily before breakfast.  11/24/12  Yes Historical Provider, MD  lisinopril (PRINIVIL,ZESTRIL) 20 MG tablet Take 1 tablet (20 mg total) by mouth daily. Patient taking differently: Take 10 mg by mouth 2 (two) times daily.  02/16/14  Yes Minna Merritts, MD  meloxicam (MOBIC) 15 MG tablet Take 15 mg by mouth daily.   Yes Historical Provider, MD  methocarbamol (ROBAXIN) 750 MG tablet Take 750 mg by mouth 3 (three) times daily as needed for muscle spasms. Take 1/2 to 1 tablet three times a day as needed.   Yes Historical Provider, MD  metoprolol succinate (TOPROL XL) 25 MG 24 hr tablet Take 1 tablet (25 mg total) by mouth daily. 03/11/15  Yes Minna Merritts, MD  ropinirole (REQUIP) 5 MG tablet Take 5 mg by mouth 2 (two) times daily.   Yes Historical Provider, MD  tapentadol (NUCYNTA) 50 MG tablet Take 50 mg by mouth 3 (three) times daily as needed for moderate pain.    Yes Historical Provider, MD  temazepam (RESTORIL) 30 MG capsule Take 30 mg by mouth at bedtime.   Yes Historical Provider, MD  torsemide (DEMADEX) 20 MG tablet Take one tablet every third day.   Yes Historical Provider, MD  traMADol (ULTRAM) 50 MG tablet Take 50 mg by mouth 3 (three) times daily as needed for moderate pain.   Yes Historical  Provider, MD  isosorbide mononitrate (IMDUR) 30 MG 24 hr tablet Take 30 mg by mouth as needed. 07/25/11 09/08/15  Minna Merritts, MD  nitroGLYCERIN (NITROSTAT) 0.4 MG SL tablet Place 1 tablet (0.4 mg total) under the tongue every 5 (five) minutes as needed for chest pain. 02/24/14 09/08/15  Minna Merritts, MD  propranolol (INDERAL) 20 MG tablet TAKE 1 TABLET THREE TIMES A DAY AS NEEDED Patient not taking: Reported on 11/02/2015 07/21/15   Minna Merritts, MD  Review of Systems  Positive ROS: As above  All other systems have been reviewed and were otherwise negative with the exception of those mentioned in the HPI and as above.  Objective: Vital signs in last 24 hours: Temp:  [98.6 F (37 C)] 98.6 F (37 C) (10/02 0723) Pulse Rate:  [84] 84 (10/02 0723) Resp:  [18] 18 (10/02 0723) BP: (112)/(43) 112/43 (10/02 0723) SpO2:  [94 %] 94 % (10/02 0723) Weight:  [100.2 kg (221 lb)] 100.2 kg (221 lb) (10/02 0723)  General Appearance: Alert, cooperative, no distress, Head: Normocephalic, without obvious abnormality, atraumatic Eyes: PERRL, conjunctiva/corneas clear, EOM's intact,    Ears: Normal  Throat: Normal  Neck: Supple, symmetrical, trachea midline, no adenopathy; thyroid: No enlargement/tenderness/nodules; no carotid bruit or JVD Back: Symmetric, no curvature, ROM normal, no CVA tenderness Lungs: Clear to auscultation bilaterally, respirations unlabored Heart: Regular rate and rhythm, no murmur, rub or gallop Abdomen: Soft, non-tender,, no masses, no organomegaly Extremities: Extremities normal, atraumatic, no cyanosis or edema Pulses: 2+ and symmetric all extremities Skin: Skin color, texture, turgor normal, no rashes or lesions  NEUROLOGIC:   Mental status: alert and oriented, no aphasia, good attention span, Fund of knowledge/ memory ok Motor Exam - grossly normal Sensory Exam - grossly normal Reflexes:  Coordination - grossly normal Gait - grossly normal Balance -  grossly normal Cranial Nerves: I: smell Not tested  II: visual acuity  OS: Normal  OD: Normal   II: visual fields Full to confrontation  II: pupils Equal, round, reactive to light  III,VII: ptosis None  III,IV,VI: extraocular muscles  Full ROM  V: mastication Normal  V: facial light touch sensation  Normal  V,VII: corneal reflex  Present  VII: facial muscle function - upper  Normal  VII: facial muscle function - lower Normal  VIII: hearing Not tested  IX: soft palate elevation  Normal  IX,X: gag reflex Present  XI: trapezius strength  5/5  XI: sternocleidomastoid strength 5/5  XI: neck flexion strength  5/5  XII: tongue strength  Normal    Data Review Lab Results  Component Value Date   WBC 7.0 11/04/2015   HGB 13.5 11/04/2015   HCT 42.2 11/04/2015   MCV 88.8 11/04/2015   PLT 212 11/04/2015   Lab Results  Component Value Date   NA 135 11/04/2015   K 4.2 11/04/2015   CL 100 (L) 11/04/2015   CO2 28 11/04/2015   BUN 20 11/04/2015   CREATININE 1.10 (H) 11/04/2015   GLUCOSE 274 (H) 11/04/2015   Lab Results  Component Value Date   INR 0.9 07/23/2011    Assessment/Plan: L3-4, L4-5 and L5-S1 disc degeneration, stenosis, spondylolisthesis, lumbago, lumbar radiculopathy, neurogenic claudication: I discussed the situation with the patient and reviewed her imaging studies with her. We have discussed the various treatment options including surgery. I have described the surgical treatment option of an L3-4, L4-5 and L5-S1 decompression, instrumentation, and fusion. I have shown her surgical models. We have discussed the risks, benefits, alternatives, expected postoperative course, and likelihood of achieving her goals with surgery. I have answered all her questions. She has decided to proceed with surgery.   Makaylyn Sinyard D 11/14/2015 9:16 AM

## 2015-11-15 ENCOUNTER — Encounter (HOSPITAL_COMMUNITY): Payer: Self-pay | Admitting: Neurosurgery

## 2015-11-15 LAB — CBC
HCT: 36.5 % (ref 36.0–46.0)
Hemoglobin: 11.1 g/dL — ABNORMAL LOW (ref 12.0–15.0)
MCH: 28.1 pg (ref 26.0–34.0)
MCHC: 30.4 g/dL (ref 30.0–36.0)
MCV: 92.4 fL (ref 78.0–100.0)
PLATELETS: 195 10*3/uL (ref 150–400)
RBC: 3.95 MIL/uL (ref 3.87–5.11)
RDW: 13.3 % (ref 11.5–15.5)
WBC: 11.2 10*3/uL — AB (ref 4.0–10.5)

## 2015-11-15 LAB — GLUCOSE, CAPILLARY
GLUCOSE-CAPILLARY: 120 mg/dL — AB (ref 65–99)
GLUCOSE-CAPILLARY: 146 mg/dL — AB (ref 65–99)
Glucose-Capillary: 122 mg/dL — ABNORMAL HIGH (ref 65–99)
Glucose-Capillary: 134 mg/dL — ABNORMAL HIGH (ref 65–99)
Glucose-Capillary: 135 mg/dL — ABNORMAL HIGH (ref 65–99)
Glucose-Capillary: 140 mg/dL — ABNORMAL HIGH (ref 65–99)

## 2015-11-15 LAB — BASIC METABOLIC PANEL
ANION GAP: 6 (ref 5–15)
BUN: 12 mg/dL (ref 6–20)
CO2: 31 mmol/L (ref 22–32)
Calcium: 8.5 mg/dL — ABNORMAL LOW (ref 8.9–10.3)
Chloride: 100 mmol/L — ABNORMAL LOW (ref 101–111)
Creatinine, Ser: 1 mg/dL (ref 0.44–1.00)
GFR, EST NON AFRICAN AMERICAN: 56 mL/min — AB (ref >60)
Glucose, Bld: 162 mg/dL — ABNORMAL HIGH (ref 65–99)
POTASSIUM: 4.3 mmol/L (ref 3.5–5.1)
SODIUM: 137 mmol/L (ref 135–145)

## 2015-11-15 MED ORDER — MORPHINE SULFATE (PF) 2 MG/ML IV SOLN
1.0000 mg | INTRAVENOUS | Status: DC | PRN
  Administered 2015-11-15: 4 mg via INTRAVENOUS
  Administered 2015-11-15 – 2015-11-17 (×5): 2 mg via INTRAVENOUS
  Filled 2015-11-15 (×2): qty 1
  Filled 2015-11-15: qty 2
  Filled 2015-11-15 (×3): qty 1

## 2015-11-15 MED FILL — Sodium Chloride IV Soln 0.9%: INTRAVENOUS | Qty: 1000 | Status: AC

## 2015-11-15 MED FILL — Heparin Sodium (Porcine) Inj 1000 Unit/ML: INTRAMUSCULAR | Qty: 30 | Status: AC

## 2015-11-15 NOTE — Care Management Note (Signed)
Case Management Note  Patient Details  Name: [name] MRN: [id number] Date of Birth: 06/20/1945  Subjective/Objective:     Pt underwent:    DECOMPRESSION LAMINECTOMY LUMBAR THREE- LUMBAR FOUR, LUMBAR FOUR-LUMBAR FIVE, LUMBAR FIVE-SACRAL ONE ISTRUMENTATION WITH PEDICLE SCREWS, RODS, PLACEMENT OF INTERBODY PROSTHESIS, POSTERIOR LATERAL ARTHRODESIS. She is from home with her spouse.             Action/Plan: Awaiting PT/OT recommendations. CM following for d/c needs.   Expected Discharge Date:                  Expected Discharge Plan:     In-House Referral:     Discharge planning Services     Post Acute Care Choice:    Choice offered to:     DME Arranged:    DME Agency:     HH Arranged:    HH Agency:     Status of Service:  In process, will continue to follow  If discussed at Long Length of Stay Meetings, dates discussed:    Additional Comments:  Pollie Friar, RN 11/15/2015, 11:20 AM

## 2015-11-15 NOTE — OR Nursing (Signed)
Addendum created to enter correct times for Recovery Care Complete and Procedural Care Complete

## 2015-11-15 NOTE — Evaluation (Signed)
Physical Therapy Evaluation Patient Details Name: [name] MRN: [id number] DOB: [birth year] Today's Date: 11/15/2015   History of Present Illness  pt is a 70 y/o female with h/o KM, CAD, chronic back pain, admitted with c/o radicular back and leg pain, s/p lami/foraminectomies to decompress nerve roots L3,4,5 and S1 and TLIF at L34, L45, L5S1 with bone grafting and pedical screw/rod fixation.  Clinical Impression  Pt admitted with/for lumbar sugery.  Pt currently limited functionally due to the problems listed below.  (see problems list.)  Pt will benefit from PT to maximize function and safety to be able to get home safely with available assist of family.  Pt is at a min assist level at this point. .     Follow Up Recommendations Home health PT    Equipment Recommendations  None recommended by PT    Recommendations for Other Services       Precautions / Restrictions Precautions Precautions: Back;Fall Precaution Booklet Issued: Yes (comment) Required Braces or Orthoses: Spinal Brace Spinal Brace: Applied in sitting position      Mobility  Bed Mobility Overal bed mobility: Needs Assistance Bed Mobility: Sit to Sidelying;Rolling Rolling: Mod assist       Sit to sidelying: Mod assist General bed mobility comments: cued on technique assist to go down to sidelying  Transfers Overall transfer level: Needs assistance Equipment used: Rolling walker (2 wheeled) Transfers: Sit to/from Stand Sit to Stand: Min assist         General transfer comment: cues for hand placement and lifting assist  Ambulation/Gait Ambulation/Gait assistance: Min assist Ambulation Distance (Feet): 35 Feet Assistive device: Rolling walker (2 wheeled) Gait Pattern/deviations: Step-through pattern Gait velocity: slower Gait velocity interpretation: Below normal speed for age/gender General Gait Details: cues for safe use of RW, posture and stability assist  Stairs             Wheelchair Mobility    Modified Rankin (Stroke Patients Only)       Balance Overall balance assessment: Needs assistance   Sitting balance-Leahy Scale: Fair     Standing balance support: Bilateral upper extremity supported;Single extremity supported Standing balance-Leahy Scale: Poor                               Pertinent Vitals/Pain Pain Assessment: 0-10 Pain Score: 7  Pain Descriptors / Indicators: Aching;Moaning;Radiating;Sore Pain Intervention(s): Monitored during session;Repositioned;Premedicated before session    [place] expects to be discharged to:: Private residence Living Arrangements: Spouse/significant other Available Help at Discharge: Family;Available 24 hours/day Type of Home: House Home Access: Stairs to enter   [employer] of Steps: 1/1 Home Layout: One level Home Equipment: Walker - 2 wheels;Cane - single point;Wheelchair - manual (can borrow other equipment)      Prior Function Level of Independence: Independent with assistive device(s)         Comments: used cane and RW usually     Hand Dominance        Extremity/Trunk Assessment   Upper Extremity Assessment: Defer to OT evaluation           Lower Extremity Assessment: Generalized weakness      Cervical / Trunk Assessment: Other exceptions  Communication   Communication: No difficulties  Cognition Arousal/Alertness: Awake/alert;Lethargic Behavior During Therapy: WFL for tasks assessed/performed Overall Cognitive Status: Within Functional Limits for tasks assessed  General Comments General comments (skin integrity, edema, etc.): pt and family instructed in back care/prec, log roll/transitions, lifting restrictions, bracing issues and progression of activity.    Exercises     Assessment/Plan    PT Assessment Patient needs continued PT services  PT Problem List Decreased strength;Decreased activity  tolerance;Decreased balance;Decreased mobility;Decreased knowledge of use of DME;Pain          PT Treatment Interventions DME instruction;Gait training;Functional mobility training;Therapeutic activities;Neuromuscular re-education;Patient/family education;Stair training    PT Goals (Current goals can be found in the Care Plan section)  Acute Rehab PT Goals Patient Stated Goal: Be able to be independent at home. PT Goal Formulation: With patient Time For Goal Achievement: 11/22/15 Potential to Achieve Goals: Good    Frequency Min 4X/week   Barriers to discharge        Co-evaluation               End of Session Equipment Utilized During Treatment: Back brace Activity Tolerance: Patient tolerated treatment well;Patient limited by lethargy Patient left: in bed;with call bell/phone within reach Nurse Communication: Mobility status         Time: [id number] PT Time Calculation (min) (ACUTE ONLY): 37 min   Charges:   PT Evaluation $PT Eval Low Complexity: 1 Procedure PT Treatments $Gait Training: 8-22 mins   PT G Codes:        Katlin Ciszewski, Tessie Fass 11/15/2015, 3:07 PM 11/15/2015  Donnella Sham, PT [phone]  (pager)

## 2015-11-15 NOTE — Progress Notes (Signed)
Patient ID: [name], female   DOB: 1946/02/04, 70 y.o.   MRN: [id number] Subjective:  The patient is alert and pleasant. She complains of back pain. She does not feel she is getting adequate pain control with her current pain medication regimen.  Objective: Vital signs in last 24 hours: Temp:  [97.5 F (36.4 C)-98.8 F (37.1 C)] 98.8 F (37.1 C) (10/03 0521) Pulse Rate:  [86-109] 96 (10/03 0521) Resp:  [11-20] 18 (10/03 0521) BP: (104-166)/(43-78) 104/48 (10/03 0521) SpO2:  [94 %-100 %] 99 % (10/03 0521) Arterial Line BP: (202)/(72) 202/72 (10/02 1538)  Intake/Output from previous day: 10/02 0701 - 10/03 0700 In: 2882.1 [P.O.:120; I.V.:2562.1; IV Piggyback:200] Out: 1375 [Urine:1275; Blood:100] Intake/Output this shift: No intake/output data recorded.  Physical exam the patient is alert and pleasant. She is moving all 4 extremities well.  Lab Results:  Recent Labs  11/14/15 1433 11/15/15 0617  WBC  --  11.2*  HGB 11.9* 11.1*  HCT 35.0* 36.5  PLT  --  195   BMET  Recent Labs  11/14/15 1433 11/15/15 0617  NA 137 137  K 4.7 4.3  CL  --  100*  CO2  --  31  GLUCOSE  --  162*  BUN  --  12  CREATININE  --  1.00  CALCIUM  --  8.5*    Studies/Results: Dg Lumbar Spine 2-3 Views  Result Date: 11/14/2015 CLINICAL DATA:  Decompressive laminectomy at the L3-4, L4-5 and L5-S1 levels with hardware fixation. EXAM: DG C-ARM 61-120 MIN; LUMBAR SPINE - 2-3 VIEW COMPARISON:  The localization lumbar spine obtained earlier today. FINDINGS: Based on previous labeling, AP and lateral C-arm views of the lumbosacral spine demonstrate interval interbody hardware and pedicle screw placement at the L3 through S1 levels. Normal alignment. IMPRESSION: Operative changes, as described above. Electronically Signed   By: Claudie Revering M.D.   On: 11/14/2015 15:54   Dg Lumbar Spine 1 View  Result Date: 11/14/2015 CLINICAL DATA:  L3-S1 PLIF EXAM: LUMBAR SPINE - 1 VIEW COMPARISON:  CT  09/12/2015 FINDINGS: Using the numbering convention of comparison CT, curved tip probe at the L5-S1 vertebral body level. IMPRESSION: Intraoperative view as above. Electronically Signed   By: Suzy Bouchard M.D.   On: 11/14/2015 15:18   Dg C-arm 61-120 Min  Result Date: 11/14/2015 CLINICAL DATA:  Decompressive laminectomy at the L3-4, L4-5 and L5-S1 levels with hardware fixation. EXAM: DG C-ARM 61-120 MIN; LUMBAR SPINE - 2-3 VIEW COMPARISON:  The localization lumbar spine obtained earlier today. FINDINGS: Based on previous labeling, AP and lateral C-arm views of the lumbosacral spine demonstrate interval interbody hardware and pedicle screw placement at the L3 through S1 levels. Normal alignment. IMPRESSION: Operative changes, as described above. Electronically Signed   By: Claudie Revering M.D.   On: 11/14/2015 15:54    Assessment/Plan: Postop day #1: I will increase the frequency of her morphine. I am reluctant to add steroids because of her diabetes. We will mobilize her with PT. Hopefully she'll be able go home in a few days.  LOS: 1 day     Jamye Balicki D 11/15/2015, 7:44 AM

## 2015-11-15 NOTE — Progress Notes (Signed)
Called MD's number as Probation officer as not received a call back.

## 2015-11-15 NOTE — Progress Notes (Signed)
Patient's BP low at 91/39, patient sleepy. MD paged to notify.

## 2015-11-16 LAB — GLUCOSE, CAPILLARY
GLUCOSE-CAPILLARY: 102 mg/dL — AB (ref 65–99)
GLUCOSE-CAPILLARY: 116 mg/dL — AB (ref 65–99)
GLUCOSE-CAPILLARY: 106 mg/dL — AB (ref 65–99)
GLUCOSE-CAPILLARY: 115 mg/dL — AB (ref 65–99)
Glucose-Capillary: 103 mg/dL — ABNORMAL HIGH (ref 65–99)
Glucose-Capillary: 109 mg/dL — ABNORMAL HIGH (ref 65–99)

## 2015-11-16 NOTE — Progress Notes (Signed)
Patient ambulated to door and back with RN, brace and walker, tolerated well. She is currently sitting up in chair for lunch. Will assist patient back to bed after lunch. Patient agreed to ambulate further with RN in afternoon.

## 2015-11-16 NOTE — Progress Notes (Signed)
Patient ID: [name], female   DOB: [birth year], 70 y.o.   MRN: [id number] Subjective:  The patient is alert and pleasant. She looks and feels much better today. Her family is at the bedside.  Objective: Vital signs in last 24 hours: Temp:  [98.1 F (36.7 C)-99.8 F (37.7 C)] 98.4 F (36.9 C) (10/04 1442) Pulse Rate:  [88-108] 96 (10/04 1442) Resp:  [16-22] 18 (10/04 1442) BP: (98-118)/(46-74) 112/74 (10/04 1442) SpO2:  [94 %-100 %] 98 % (10/04 1442)  Intake/Output from previous day: 10/03 0701 - 10/04 0700 In: 60 [P.O.:60] Out: -  Intake/Output this shift: No intake/output data recorded.  Physical exam the patient is alert and pleasant. She is in no apparent distress. She is moving her lower extremities well.  Lab Results:  Recent Labs  11/14/15 1433 11/15/15 0617  WBC  --  11.2*  HGB 11.9* 11.1*  HCT 35.0* 36.5  PLT  --  195   BMET  Recent Labs  11/14/15 1433 11/15/15 0617  NA 137 137  K 4.7 4.3  CL  --  100*  CO2  --  31  GLUCOSE  --  162*  BUN  --  12  CREATININE  --  1.00  CALCIUM  --  8.5*    Studies/Results: No results found.  Assessment/Plan: Postop day #2: The patient did mobilize much today. Hopefully physical therapy will mobilize her tomorrow. She looks like she may need skilled nursing facility placement. I will ask the case manager to work on this. I will asked Dr. Cyndy Freeze to see the patient in my absence. I have answered all their questions.  LOS: 2 days     Honora Searson D 11/16/2015, 5:11 PM

## 2015-11-16 NOTE — Progress Notes (Signed)
Physical Therapy Treatment Patient Details Name: [name] MRN: [id number] DOB: 14-Nov-1945 Today's Date: 11/16/2015    History of Present Illness pt is a 70 y/o female with h/o KM, CAD, chronic back pain, admitted with c/o radicular back and leg pain, s/p lami/foraminectomies to decompress nerve roots L3,4,5 and S1 and TLIF at L34, L45, L5S1 with bone grafting and pedical screw/rod fixation.    PT Comments    Patient limited by lethargy this session. Continue to progress as tolerated.   Follow Up Recommendations  Home health PT     Equipment Recommendations  None recommended by PT    Recommendations for Other Services       Precautions / Restrictions Precautions Precautions: Back;Fall    Mobility  Bed Mobility Overal bed mobility: Needs Assistance Bed Mobility: Rolling;Sidelying to Sit;Sit to Sidelying Rolling: Max assist Sidelying to sit: Max assist     Sit to sidelying: Mod assist General bed mobility comments: cues for sequencing/technique; assist to mobilize bilat LE and elevate/lower trunk and scoot hips to EOB with use of bed pad  Transfers                 General transfer comment: deferred due to lethargy  Ambulation/Gait                 Stairs            Wheelchair Mobility    Modified Rankin (Stroke Patients Only)       Balance     Sitting balance-Leahy Scale: Fair       Standing balance-Leahy Scale: Poor                      Cognition Arousal/Alertness: Lethargic;Suspect due to medications Behavior During Therapy: Flat affect Overall Cognitive Status: Within Functional Limits for tasks assessed                      Exercises      General Comments General comments (skin integrity, edema, etc.): RN and family present        Pertinent Vitals/Pain Pain Assessment: Faces Faces Pain Scale: No hurt Pain Intervention(s): Monitored during session;Premedicated before session    Home Living                      Prior Function            PT Goals (current goals can now be found in the care plan section) Acute Rehab PT Goals Patient Stated Goal: none stated Progress towards PT goals: Not progressing toward goals - comment (limited by lethargy)    Frequency    Min 4X/week      PT Plan Current plan remains appropriate    Co-evaluation             End of Session Equipment Utilized During Treatment: Back brace Activity Tolerance: Patient tolerated treatment well;Patient limited by lethargy Patient left: in bed;with call bell/phone within reach     Time: [id number] PT Time Calculation (min) (ACUTE ONLY): 13 min  Charges:  $Therapeutic Activity: 8-22 mins                    G Codes:      Salina April, PTA Pager: [phone]   11/16/2015, 1:51 PM

## 2015-11-16 NOTE — Progress Notes (Signed)
Patient hard to keep awake when working with physical therapy. Patient got to side of bed with PT help, kept closing eyes and leaning back. Patient was assited back to laying down by PT. Vitals signs taken, see assessment. PRN IV fluids restarted. Patient is cool to touch, pale, neuro assessment unchanged other than drowsiness. CBG 116. Family is at patient's bedside. MD paged. Will continue to monitor.

## 2015-11-17 ENCOUNTER — Encounter (HOSPITAL_COMMUNITY): Payer: Self-pay | Admitting: General Practice

## 2015-11-17 LAB — GLUCOSE, CAPILLARY
GLUCOSE-CAPILLARY: 118 mg/dL — AB (ref 65–99)
GLUCOSE-CAPILLARY: 118 mg/dL — AB (ref 65–99)
GLUCOSE-CAPILLARY: 92 mg/dL (ref 65–99)
GLUCOSE-CAPILLARY: 163 mg/dL — AB (ref 65–99)
Glucose-Capillary: 113 mg/dL — ABNORMAL HIGH (ref 65–99)
Glucose-Capillary: 76 mg/dL (ref 65–99)

## 2015-11-17 MED ORDER — INFLUENZA VAC SPLIT QUAD 0.5 ML IM SUSY
0.5000 mL | PREFILLED_SYRINGE | INTRAMUSCULAR | Status: AC
Start: 2015-11-18 — End: 2015-11-18
  Administered 2015-11-18: 0.5 mL via INTRAMUSCULAR
  Filled 2015-11-17: qty 0.5

## 2015-11-17 NOTE — Progress Notes (Signed)
Placed pt on nasal Cpap tolerating well with 3 lpm bled in oxygen tolerating well.

## 2015-11-17 NOTE — NC FL2 (Signed)
[place] LEVEL OF CARE SCREENING TOOL     IDENTIFICATION  Patient Name: [name] Birthdate: [birth year] Sex: female Admission Date (Current Location): 11/14/2015  [facility] and [place] Number:  Herbalist and Address:  [facility] [place], [place] [address], [place], [place] [address]      Provider Number: [id number]  Attending Physician Name and Address:  Newman Pies, MD  Relative Name and Phone Number:       Current Level of Care: Hospital Recommended Level of Care: [place] Prior Approval Number:    Date Approved/Denied:   PASRR Number: [id number]  Discharge Plan: SNF    Current Diagnoses: Patient Active Problem List   Diagnosis Date Noted  . Lumbar degenerative disc disease 11/14/2015  . Itching 02/24/2014  . Anxiety 02/24/2014  . Fatigue 10/23/2013  . Chest pain 06/09/2012  . CAD (coronary artery disease) 06/09/2012  . Hyperlipidemia 08/09/2011  . Arm pain 06/28/2011  . Tachycardia 05/05/2010  . Diabetes mellitus ([place]) 05/05/2010  . OSA on CPAP 05/05/2010  . HTN (hypertension) 05/05/2010  . Edema 05/05/2010  . HYPERTRIGLYCERIDEMIA 06/08/2008  . DYSPNEA 06/08/2008    Orientation RESPIRATION BLADDER Height & Weight     Self, Time, Situation, Place  Normal Continent Weight: 225 lb 6.4 oz (102.2 kg) Height:  5\' 3"  (160 cm)  BEHAVIORAL SYMPTOMS/MOOD NEUROLOGICAL BOWEL NUTRITION STATUS      Continent Diet (Carb Modified, Thin Liquids)  AMBULATORY STATUS COMMUNICATION OF NEEDS Skin   Limited Assist Verbally Surgical wounds                       Personal Care Assistance Level of Assistance  Bathing, Feeding, Dressing Bathing Assistance: Limited assistance Feeding assistance: Independent Dressing Assistance: Limited assistance     Functional Limitations Info  Hearing, Speech, Sight Sight Info: Adequate Hearing Info: Adequate Speech Info: Adequate    SPECIAL CARE FACTORS FREQUENCY   PT (By licensed PT)     PT Frequency: 5              Contractures Contractures Info: Not present    Additional Factors Info  Code Status, Allergies, Psychotropic Code Status Info: Full Code Allergies Info: Ceftin Cefuroxime Axetil, Codeine, Penicillins, Vicodin Hydrocodone-acetaminophen Psychotropic Info: Medications         Current Medications (11/17/2015):  This is the current hospital active medication list Current Facility-Administered Medications  Medication Dose Route Frequency Provider Last Rate Last Dose  . acetaminophen (TYLENOL) tablet 650 mg  650 mg Oral Q4H PRN Newman Pies, MD       Or  . acetaminophen (TYLENOL) suppository 650 mg  650 mg Rectal Q4H PRN Newman Pies, MD      . alum & mag hydroxide-simeth (MAALOX/MYLANTA) 200-200-20 MG/5ML suspension 30 mL  30 mL Oral Q6H PRN Newman Pies, MD      . bisacodyl (DULCOLAX) suppository 10 mg  10 mg Rectal Daily PRN Newman Pies, MD      . diazepam (VALIUM) tablet 5 mg  5 mg Oral Q6H PRN Newman Pies, MD   5 mg at 11/15/15 1411  . docusate sodium (COLACE) capsule 100 mg  100 mg Oral BID Newman Pies, MD   100 mg at 11/17/15 1030  . DULoxetine (CYMBALTA) DR capsule 60 mg  60 mg Oral Daily Newman Pies, MD   60 mg at 11/17/15 1027  . furosemide (LASIX) tablet 20 mg  20 mg Oral Daily Newman Pies, MD  20 mg at 11/17/15 1109  . gabapentin (NEURONTIN) capsule 300 mg  300 mg Oral TID Newman Pies, MD   300 mg at 11/17/15 1026  . hydrOXYzine (ATARAX/VISTARIL) tablet 50 mg  50 mg Oral BID Newman Pies, MD   50 mg at 11/17/15 1025  . [START ON 11/18/2015] Influenza vac split quadrivalent PF (FLUARIX) injection 0.5 mL  0.5 mL Intramuscular Tomorrow-1000 Newman Pies, MD      . insulin aspart (novoLOG) injection 0-20 Units  0-20 Units Subcutaneous Q4H Newman Pies, MD   3 Units at 11/16/15 0024  . insulin glargine (LANTUS) injection 32 Units  32 Units Subcutaneous q morning - 10a Roderic Palau, MD   32 Units at 11/17/15 1028  . lactated ringers infusion   Intravenous Continuous Roderic Palau, MD 50 mL/hr at 11/14/15 0831    . lactated ringers infusion   Intravenous Continuous Newman Pies, MD 75 mL/hr at 11/14/15 2215    . levothyroxine (SYNTHROID, LEVOTHROID) tablet 75 mcg  75 mcg Oral QAC breakfast Newman Pies, MD   75 mcg at 11/17/15 0813  . lisinopril (PRINIVIL,ZESTRIL) tablet 20 mg  20 mg Oral Daily Newman Pies, MD   20 mg at 11/15/15 1000  . menthol-cetylpyridinium (CEPACOL) lozenge 3 mg  1 lozenge Oral PRN Newman Pies, MD       Or  . phenol [facility]) mouth spray 1 spray  1 spray Mouth/Throat PRN Newman Pies, MD      . methocarbamol (ROBAXIN) tablet 750 mg  750 mg Oral TID PRN Newman Pies, MD   750 mg at 11/17/15 0608  . metoprolol succinate (TOPROL-XL) 24 hr tablet 25 mg  25 mg Oral Daily Newman Pies, MD   25 mg at 11/15/15 1000  . morphine 2 MG/ML injection 1-4 mg  1-4 mg Intravenous Q2H PRN Newman Pies, MD   2 mg at 11/16/15 0825  . ondansetron (ZOFRAN) injection 4 mg  4 mg Intravenous Q4H PRN Newman Pies, MD      . pantoprazole (PROTONIX) EC tablet 80 mg  80 mg Oral Q1200 Newman Pies, MD   80 mg at 11/17/15 1254  . rOPINIRole (REQUIP) tablet 5 mg  5 mg Oral BID Newman Pies, MD   5 mg at 11/17/15 1024  . tapentadol (NUCYNTA) tablet 100 mg  100 mg Oral Q4H PRN Newman Pies, MD   100 mg at 11/17/15 1022  . temazepam (RESTORIL) capsule 30 mg  30 mg Oral QHS PRN Newman Pies, MD      . traMADol Veatrice Bourbon) tablet 50 mg  50 mg Oral TID PRN Newman Pies, MD   50 mg at 11/17/15 1255     Discharge Medications: Please see discharge summary for a list of discharge medications.  Relevant Imaging Results:  Relevant Lab Results:   Additional Information SSN:  [id number]  Darden Dates, LCSW

## 2015-11-17 NOTE — Care Management Note (Signed)
Case Management Note  Patient Details  Name: ANY MCNEICE MRN: [id number] Date of Birth: Oct 17, 1945  Subjective/Objective:                    Action/Plan: CM met with the patient, husband and her sister at the bedside. They are interested in rehab at discharge for Mrs Tschirhart. They do not have 24 hour supervision at home and feel the patient is high risk of falling at home alone. CM informed CSW. CM will continue to follow for d/c needs.   Expected Discharge Date:                  Expected Discharge Plan:  [place]  In-House Referral:  Clinical Social Work  Discharge planning Services  CM Consult  Post Acute Care Choice:    Choice offered to:     DME Arranged:    DME Agency:     HH Arranged:    [place] Agency:     Status of Service:  In process, will continue to follow  If discussed at Long Length of Stay Meetings, dates discussed:    Additional Comments:  Pollie Friar, RN 11/17/2015, 3:46 PM

## 2015-11-17 NOTE — Clinical Social Work Note (Signed)
Clinical Social Work Assessment  Patient Details  Name: [name] MRN: [id number] Date of Birth: Dec 15, 1945  Date of referral:  11/17/15               Reason for consult:  Facility Placement                Permission sought to share information with:  Family Supports Permission granted to share information::  Yes, Verbal Permission Granted  Name::     Rushie Chestnut  Relationship::  husband  Contact Information:  [phone]  Housing/Transportation Living arrangements for the past 2 months:  Single Family Home Source of Information:  Patient, Spouse Patient Interpreter Needed:  None Criminal Activity/Legal Involvement Pertinent to Current Situation/Hospitalization:  No - Comment as needed Significant Relationships:  Adult Children, Siblings, Spouse Lives with:  Spouse Do you feel safe going back to the place where you live?  Yes Need for family participation in patient care:  Yes (Comment)  Care giving concerns:  No care giving concerns identified.   Social Worker assessment / plan:  CSW met with pt and spouse to address consult for New SNF. CSW introduced herself and explained role of social work. CSW also explained the process of discharging to SNF with Medicare AB. PT is recommending HHPT, however is requiring more assistance. Pt and pt's husband aware and agreeable to discharging to SNF. CSW initiated a SNF search for [facility] and will follow up bed offers. Pt preference is [facility] and [facility]. CSW will continue to follow.   Employment status:  Retired Forensic scientist:  Medicare PT Recommendations:  Home with [place] / Referral to community resources:  [place]  Patient/Family's Response to care:  Pt and spouse were appreciative of CSW support.   Patient/Family's Understanding of and Emotional Response to Diagnosis, Current Treatment, and Prognosis:  Pt feels that she would benefit from STR.   Emotional  Assessment Appearance:  Appears stated age Attitude/Demeanor/Rapport:  Other (Appropriate) Affect (typically observed):  Accepting, Adaptable, Pleasant Orientation:  Oriented to Self, Oriented to Place, Oriented to  Time, Oriented to Situation Alcohol / Substance use:  Never Used Psych involvement (Current and /or in the community):  No (Comment)  Discharge Needs  Concerns to be addressed:  Adjustment to Illness Readmission within the last 30 days:  No Current discharge risk:  None Barriers to Discharge:  Continued Medical Work up   [employer], LCSW 11/17/2015, 4:32 PM

## 2015-11-17 NOTE — Progress Notes (Signed)
Patient ID: [name], female   DOB: [birth year], 70 y.o.   MRN: [id number] BP 128/60 (BP Location: Left Arm)   Pulse (!) 114   Temp 99.6 F (37.6 C) (Oral)   Resp 18   Ht 5\' 3"  (1.6 m)   Wt 102.2 kg (225 lb 6.4 oz)   SpO2 95%   BMI 39.93 kg/m  Alert and oriented x 4 Moving all extremities Wound is clean, and dry Continuing to work with PT, and Ot.

## 2015-11-17 NOTE — Progress Notes (Signed)
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    PT Comments    Patient is making progress toward mobility goals and tolerated increased gait distance and stair training this session. Pt recalled 2/3 precautions beginning of session and 3/3 precautions reviewed with pt. Pt has most difficulty with bed mobility. Continue to progress as tolerated with anticipated d/c home with HHPT if pt will have 24 hour assistance/supervision.   Follow Up Recommendations  Home health PT     Equipment Recommendations  None recommended by PT    Recommendations for Other Services       Precautions / Restrictions Precautions Precautions: Back;Fall Precaution Comments: reviewed precautions; pt able to recall 2/3 beginning of session Required Braces or Orthoses: Spinal Brace Spinal Brace: Applied in sitting position    Mobility  Bed Mobility Overal bed mobility: Needs Assistance Bed Mobility: Sit to Sidelying;Rolling Rolling: Min guard       Sit to sidelying: Min assist General bed mobility comments: attempted returning to bed with bed elevated to simulate high bed at pt's home; pt unable to scoot hips back enough so bed lower; assist to elevate bilat LE into bed; cues for sequencing/technique  Transfers Overall transfer level: Needs assistance Equipment used: Rolling walker (2 wheeled) Transfers: Sit to/from Stand Sit to Stand: Min guard         General transfer comment: cues for safe hand placement   Ambulation/Gait Ambulation/Gait assistance: Supervision Ambulation Distance (Feet): 100 Feet Assistive device: Rolling walker (2  wheeled) Gait Pattern/deviations: Step-through pattern;Decreased stride length     General Gait Details: cues for posture; slow, steady gait    Stairs Stairs: Yes Stairs assistance: Min assist Stair Management: Two rails;Forwards;No rails;Backwards;With walker Number of Stairs:  (1 and 1) General stair comments: cues for sequencing, maintaining back precuations, and technique; practiced ascending forward with bilat rails and ascending backward with RW  Wheelchair Mobility    Modified Rankin (Stroke Patients Only)       Balance     Sitting balance-Leahy Scale: Good       Standing balance-Leahy Scale: Fair                      Cognition Arousal/Alertness: Awake/alert Behavior During Therapy: WFL for tasks assessed/performed Overall Cognitive Status: Within Functional Limits for tasks assessed                      Exercises      General Comments General comments (skin integrity, edema, etc.): husband present end of session      Pertinent Vitals/Pain Pain Assessment: 0-10 Pain Score: 5  Pain Location: back and bilat hips Pain Descriptors / Indicators: Aching;Guarding;Sore Pain Intervention(s): Limited activity within patient's tolerance;Monitored during session;Premedicated before session;Repositioned    Home Living                      Prior Function            PT Goals (current goals can now be found in the care plan section) Acute Rehab PT Goals Patient Stated Goal: be able to go home Progress towards PT  goals: Progressing toward goals    Frequency    Min 4X/week      PT Plan Current plan remains appropriate    Co-evaluation             End of Session Equipment Utilized During Treatment: Back brace;Gait belt Activity Tolerance: Patient tolerated treatment well Patient left: in bed;with call bell/phone within reach;with family/visitor present     Time: [id number] PT Time Calculation (min) (ACUTE ONLY): 27  min  Charges:  $Gait Training: 8-22 mins $Therapeutic Activity: 8-22 mins                    G Codes:      Salina April, PTA Pager: [phone]   11/17/2015, 9:25 AM

## 2015-11-17 NOTE — Clinical Social Work Placement (Signed)
   CLINICAL SOCIAL WORK PLACEMENT  NOTE  Date:  11/17/2015  Patient Details  Name: [name] MRN: [id number] Date of Birth: [birth year]  Clinical Social Work is seeking post-discharge placement for this patient at the [place] level of care (*CSW will initial, date and re-position this form in  chart as items are completed):  Yes   Patient/family provided with [place] Work Department's list of facilities offering this level of care within the geographic area requested by the patient (or if unable, by the patient's family).  Yes   Patient/family informed of their freedom to choose among providers that offer the needed level of care, that participate in Medicare, Medicaid or managed care program needed by the patient, have an available bed and are willing to accept the patient.  Yes   Patient/family informed of [place]'s ownership interest in [facility] and [facility], as well as of the fact that they are under no obligation to receive care at these facilities.  PASRR submitted to EDS on 11/17/15     PASRR number received on 11/17/15     Existing PASRR number confirmed on       FL2 transmitted to all facilities in geographic area requested by pt/family on 11/17/15     FL2 transmitted to all facilities within larger geographic area on       Patient informed that his/her managed care company has contracts with or will negotiate with certain facilities, including the following:            Patient/family informed of bed offers received.  Patient chooses bed at       Physician recommends and patient chooses bed at      Patient to be transferred to   on  .  Patient to be transferred to facility by       Patient family notified on   of transfer.  Name of family member notified:        PHYSICIAN       Additional Comment:    _______________________________________________ Darden Dates, LCSW 11/17/2015, 4:12 PM

## 2015-11-17 NOTE — Care Management Important Message (Signed)
Important Message  Patient Details  Name: [name] MRN: [id number] Date of Birth: 05-Aug-1945   Medicare Important Message Given:  Yes    Orbie Pyo 11/17/2015, 12:04 PM

## 2015-11-18 DIAGNOSIS — E119 Type 2 diabetes mellitus without complications: Secondary | ICD-10-CM | POA: Insufficient documentation

## 2015-11-18 DIAGNOSIS — F329 Major depressive disorder, single episode, unspecified: Secondary | ICD-10-CM | POA: Insufficient documentation

## 2015-11-18 DIAGNOSIS — G4700 Insomnia, unspecified: Secondary | ICD-10-CM | POA: Insufficient documentation

## 2015-11-18 DIAGNOSIS — G20A1 Parkinson's disease without dyskinesia, without mention of fluctuations: Secondary | ICD-10-CM | POA: Insufficient documentation

## 2015-11-18 DIAGNOSIS — F0280 Dementia in other diseases classified elsewhere without behavioral disturbance: Secondary | ICD-10-CM | POA: Insufficient documentation

## 2015-11-18 LAB — GLUCOSE, CAPILLARY
GLUCOSE-CAPILLARY: 134 mg/dL — AB (ref 65–99)
GLUCOSE-CAPILLARY: 181 mg/dL — AB (ref 65–99)
Glucose-Capillary: 137 mg/dL — ABNORMAL HIGH (ref 65–99)
Glucose-Capillary: 80 mg/dL (ref 65–99)

## 2015-11-18 MED ORDER — DIAZEPAM 5 MG PO TABS: 5.0000 mg | ORAL_TABLET | Freq: Four times a day (QID) | ORAL | 0 refills | Status: DC | PRN

## 2015-11-18 MED ORDER — TRAMADOL HCL 50 MG PO TABS: 50.0000 mg | ORAL_TABLET | Freq: Four times a day (QID) | ORAL | 1 refills | Status: DC | PRN

## 2015-11-18 MED ORDER — TAPENTADOL HCL 100 MG PO TABS: 100.0000 mg | ORAL_TABLET | ORAL | 0 refills | Status: DC | PRN

## 2015-11-18 NOTE — Progress Notes (Signed)
Pt has a diet order and is eating have a question as to why she is q4 blood sugars her blood sugars are not terribly high.

## 2015-11-18 NOTE — Progress Notes (Addendum)
Subjective: Patient reports "I think they have a room for me. I'm doing well!"  Objective: Vital signs in last 24 hours: Temp:  [98.4 F (36.9 C)-100 F (37.8 C)] 100 F (37.8 C) (10/06 1342) Pulse Rate:  [96-119] 96 (10/06 1342) Resp:  [16-18] 18 (10/06 1342) BP: (114-144)/(49-73) 115/49 (10/06 1342) SpO2:  [95 %-100 %] 98 % (10/06 1342)  Intake/Output from previous day: 10/05 0701 - 10/06 0700 In: 720 [P.O.:720] Out: -  Intake/Output this shift: No intake/output data recorded.  Ambulating with PT, smiling and conversant. drsg intact, removed to reveal well-approximated incision with steri's intact. No erythema, swelling, or drainage. Good strength BLE. No c/o pain at present, noting lumbar pain at other times.    Lab Results: No results for input(s): WBC, HGB, HCT, PLT in the last 72 hours. BMET No results for input(s): NA, K, CL, CO2, GLUCOSE, BUN, CREATININE, CALCIUM in the last 72 hours.  Studies/Results: No results found.  Assessment/Plan: Improving  LOS: 4 days  Per DrStern, d/c to SNF. Office f/u with DrJenkins in 3-4 weeks. Rx's Nucenta 100mg , Tramadol 50mg  , and Valium 5mg  printed for transport. Pt 7 family verbalize understanding of d/c instructions. Ok to shower. Ok to leave incision open to air. Steri's will fall off.    Verdis Prime 11/18/2015, 2:00 PM  Patient progressing well.

## 2015-11-18 NOTE — Clinical Social Work Placement (Signed)
   CLINICAL SOCIAL WORK PLACEMENT  NOTE  Date:  11/18/2015  Patient Details  Name: [name] MRN: [id number] Date of Birth: December 20, 1945  Clinical Social Work is seeking post-discharge placement for this patient at the [place] level of care (*CSW will initial, date and re-position this form in  chart as items are completed):  Yes   Patient/family provided with [place] Work Department's list of facilities offering this level of care within the geographic area requested by the patient (or if unable, by the patient's family).  Yes   Patient/family informed of their freedom to choose among providers that offer the needed level of care, that participate in Medicare, Medicaid or managed care program needed by the patient, have an available bed and are willing to accept the patient.  Yes   Patient/family informed of [place]'s ownership interest in [facility] and [facility], as well as of the fact that they are under no obligation to receive care at these facilities.  PASRR submitted to EDS on 11/17/15     PASRR number received on 11/17/15     Existing PASRR number confirmed on       FL2 transmitted to all facilities in geographic area requested by pt/family on 11/17/15     FL2 transmitted to all facilities within larger geographic area on       Patient informed that his/her managed care company has contracts with or will negotiate with certain facilities, including the following:        Yes   Patient/family informed of bed offers received.  Patient [facility] bed at [facility]     Physician recommends and patient chooses bed at      Patient to be transferred to [facility] on 11/18/15.  Patient to be transferred to facility by Pt's sister     Patient family notified on 11/18/15 of transfer.  Name of family member notified:  Pt's sister     PHYSICIAN       Additional Comment:     _______________________________________________ Darden Dates, LCSW 11/18/2015, 4:40 PM

## 2015-11-18 NOTE — Progress Notes (Signed)
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    PT Comments    Patient continues to progress toward mobility goals but continues to demo decreased recall of precautions and will need supervision/assistance for mobility. Pt has decreased assistance available upon d/c. Recommending [facility] for further skilled PT services to maximize independence and safety with mobility prior to d/c home.   Follow Up Recommendations  SNF     Equipment Recommendations  None recommended by PT    Recommendations for Other Services       Precautions / Restrictions Precautions Precautions: Back;Fall Precaution Comments: reviewed precautions Required Braces or Orthoses: Spinal Brace Spinal Brace: Applied in sitting position Restrictions Weight Bearing Restrictions: No    Mobility  Bed Mobility Overal bed mobility: Needs Assistance Bed Mobility: Rolling;Sidelying to Sit Rolling: Min guard Sidelying to sit: Min assist       General bed mobility comments: cues for sequencing; assist to elevate trunk into sitting  Transfers Overall transfer level: Needs assistance Equipment used: Rolling walker (2 wheeled) Transfers: Sit to/from Stand Sit to Stand: Min guard         General transfer comment: cues for safe hand placement from EOB and BSC  Ambulation/Gait Ambulation/Gait assistance: Min guard Ambulation Distance (Feet): 200 Feet Assistive device: Rolling walker (2 wheeled) Gait Pattern/deviations: Step-through pattern;Decreased stride length     General Gait Details: steady gait; improved posture   Stairs            Wheelchair  Mobility    Modified Rankin (Stroke Patients Only)       Balance     Sitting balance-Leahy Scale: Good       Standing balance-Leahy Scale: Fair                      Cognition Arousal/Alertness: Awake/alert Behavior During Therapy: WFL for tasks assessed/performed Overall Cognitive Status: Within Functional Limits for tasks assessed                      Exercises      General Comments        Pertinent Vitals/Pain Pain Assessment: 0-10 Pain Score: 4  Pain Location: back Pain Descriptors / Indicators: Sore Pain Intervention(s): Limited activity within patient's tolerance;Monitored during session;Premedicated before session;Repositioned    Home Living                      Prior Function            PT Goals (current goals can now be found in the care plan section) Acute Rehab PT Goals Patient Stated Goal: get to rehab Progress towards PT goals: Progressing toward goals    Frequency    Min 4X/week      PT Plan Discharge plan needs to be updated    Co-evaluation             End of Session Equipment Utilized During Treatment: Back brace;Gait belt Activity Tolerance: Patient tolerated treatment well Patient left: in bed;with call bell/phone within reach;with family/visitor present     Time: [id number] PT Time Calculation (min) (ACUTE ONLY): 25 min  Charges:  $Gait Training: 8-22 mins $Therapeutic Activity: 8-22 mins                    G Codes:      Salina April, PTA Pager: [phone]   11/18/2015, 2:16 PM

## 2015-11-18 NOTE — Discharge Summary (Addendum)
Physician Discharge Summary  Patient ID: [name] MRN: [id number] DOB/AGE: 10-12-1945 70 y.o.  Admit date: 11/14/2015 Discharge date: 11/18/2015  Admission Diagnoses:L3-4, L4-5 and L5-S1 facet arthropathy; lumbago; lumbar radiculopathy     Discharge Diagnoses: L3-4, L4-5 and L5-S1 facet arthropathy; lumbago; lumbar radiculopathy   s/p Bilateral L3-4, L4-5 and L5-S1 Laminotomy/foraminotomies to decompress the bilateral L3, L4, L5 and S1 nerve roots; L3-4, L4-5 and L5-S1 transforaminal lumbar interbody fusion with local morselized autograft bone and Kinnex graft extender; insertion of interbody prosthesis at L3-4, L4-5 and L5-S1 (globus peek expandable interbody prosthesis); posterior segmental instrumentation from L3 to S1 with globus titanium pedicle screws and rods; posterior lateral arthrodesis at L3-4, L4-5 and L5-S1 with local morselized autograft bone and Kinnex bone graft extender.  Active Problems:   Lumbar degenerative disc disease   Discharged Condition: good  Hospital Course: [name] was admitted for surgery with dx facet arthropathy, and radiculopathy. Following uncomplicated decompression and fusion L3-S1 levels, she recovered well and transferred to [facility] for nursing care and therapies. She has mobilized nicely.   Consults: None  Significant Diagnostic Studies: radiology: X-Ray: intra-op  Treatments: surgery: Bilateral L3-4, L4-5 and L5-S1 Laminotomy/foraminotomies to decompress the bilateral L3, L4, L5 and S1 nerve roots; L3-4, L4-5 and L5-S1 transforaminal lumbar interbody fusion with local morselized autograft bone and Kinnex graft extender; insertion of interbody prosthesis at L3-4, L4-5 and L5-S1 (globus peek expandable interbody prosthesis); posterior segmental instrumentation from L3 to S1 with globus titanium pedicle screws and rods; posterior lateral arthrodesis at L3-4, L4-5 and L5-S1 with local morselized autograft bone and Kinnex bone graft  extender.   Discharge Exam: Blood pressure (!) 115/49, pulse 96, temperature 100 F (37.8 C), temperature source Oral, resp. rate 18, height 5\' 3"  (1.6 m), weight 102.2 kg (225 lb 6.4 oz), SpO2 98 %. Ambulating with PT, smiling and conversant. drsg intact, removed to reveal well-approximated incision with steri's intact. No erythema, swelling, or drainage. Good strength BLE. No c/o pain at present, noting lumbar pain at other times.       Disposition: Discharge to SNF. Office f/u with DrJenkins in 3-4 weeks. Rx's Nucenta 100mg , Tramadol 50mg  , and Valium 5mg  printed for transport. Pt & family verbalize understanding of d/c instructions. Ok to shower. Ok to leave incision open to air. Steri's will fall off.         Medication List    TAKE these medications   Alirocumab 150 MG/ML Sopn Commonly known as:  PRALUENT Inject 1 pen into the skin every 14 (fourteen) days.   aspirin 81 MG tablet Take 81 mg by mouth daily.   cholecalciferol 1000 units tablet Commonly known as:  VITAMIN D Take 1,000 Units by mouth daily.   diazepam 5 MG tablet Commonly known as:  VALIUM Take 1 tablet (5 mg total) by mouth every 6 (six) hours as needed for muscle spasms.   docusate sodium 100 MG capsule Commonly known as:  COLACE Take 100 mg by mouth daily.   DULoxetine 60 MG capsule Commonly known as:  CYMBALTA Take 60 mg by mouth daily.   esomeprazole 40 MG capsule Commonly known as:  NEXIUM Take 40 mg by mouth 3 (three) times daily.   estrogen-methylTESTOSTERone 0.625-1.25 MG tablet Take 1 tablet by mouth daily.   furosemide 20 MG tablet Commonly known as:  LASIX Take 20 mg by mouth daily.   gabapentin 300 MG capsule Commonly known as:  NEURONTIN Take 300 mg by mouth 4 (four) times daily. Take  300 mg tablets 3-4 times daily.   hydrOXYzine 50 MG tablet Commonly known as:  ATARAX/VISTARIL Take 50 mg by mouth 2 (two) times daily.   insulin aspart 100 UNIT/ML FlexPen Commonly known as:   NOVOLOG Inject 10 Units into the skin See admin instructions. 10 units before breakfast, 10 units before lunch, 16 units at dinner, >200 increase by 2 units per sliding scale   isosorbide mononitrate 30 MG 24 hr tablet Commonly known as:  IMDUR Take 30 mg by mouth as needed.   LANTUS SOLOSTAR 100 UNIT/ML Solostar Pen Generic drug:  Insulin Glargine 32 units in am   levothyroxine 75 MCG tablet Commonly known as:  SYNTHROID, LEVOTHROID Take 75 mcg by mouth daily before breakfast.   lisinopril 20 MG tablet Commonly known as:  PRINIVIL,ZESTRIL Take 1 tablet (20 mg total) by mouth daily. What changed:  how much to take  when to take this   meloxicam 15 MG tablet Commonly known as:  MOBIC Take 15 mg by mouth daily.   methocarbamol 750 MG tablet Commonly known as:  ROBAXIN Take 750 mg by mouth 3 (three) times daily as needed for muscle spasms. Take 1/2 to 1 tablet three times a day as needed.   metoprolol succinate 25 MG 24 hr tablet Commonly known as:  TOPROL XL Take 1 tablet (25 mg total) by mouth daily.   nitroGLYCERIN 0.4 MG SL tablet Commonly known as:  NITROSTAT Place 1 tablet (0.4 mg total) under the tongue every 5 (five) minutes as needed for chest pain.   NUCYNTA 50 MG tablet Generic drug:  tapentadol Take 50 mg by mouth 3 (three) times daily as needed for moderate pain. What changed:  Another medication with the same name was added. Make sure you understand how and when to take each.   Tapentadol HCl 100 MG Tabs Commonly known as:  NUCYNTA Take 1 tablet (100 mg total) by mouth every 4 (four) hours as needed for moderate pain. What changed:  You were already taking a medication with the same name, and this prescription was added. Make sure you understand how and when to take each.   propranolol 20 MG tablet Commonly known as:  INDERAL TAKE 1 TABLET THREE TIMES A DAY AS NEEDED   ropinirole 5 MG tablet Commonly known as:  REQUIP Take 5 mg by mouth 2 (two)  times daily.   temazepam 30 MG capsule Commonly known as:  RESTORIL Take 30 mg by mouth at bedtime.   torsemide 20 MG tablet Commonly known as:  DEMADEX Take one tablet every third day.   traMADol 50 MG tablet Commonly known as:  ULTRAM Take 50 mg by mouth 3 (three) times daily as needed for moderate pain. What changed:  Another medication with the same name was added. Make sure you understand how and when to take each.   traMADol 50 MG tablet Commonly known as:  ULTRAM Take 1 tablet (50 mg total) by mouth every 6 (six) hours as needed for moderate pain. What changed:  You were already taking a medication with the same name, and this prescription was added. Make sure you understand how and when to take each.        Signed: Verdis Prime 11/18/2015, 2:10 PM   Patient progressing well.

## 2015-11-18 NOTE — Progress Notes (Signed)
Patient is discharged form room 5C06 at this time. Alert and in stable condition. IV site d/c'd. Instructions read to patient and sister with understanding verbalized. Report given to receiving nurse Butch Penny, LPN at [facility]. Left unit via wheelchair with sister and all belongings at side. Sister to transport to rehab.

## 2015-11-18 NOTE — Clinical Social Work Note (Signed)
Pt is ready for discharge today. CSW presented bed offers, and she chose [facility]. Pt will update her husband. Facility is ready to admit pt as they have received discharge information. Pt's sister will provide transportation. RN called report. CSW is signing off as no further needs identified.   Darden Dates, MSW, LCSW Clinical Social Worker [phone]

## 2015-11-29 ENCOUNTER — Encounter (HOSPITAL_COMMUNITY): Payer: Self-pay | Admitting: Neurosurgery

## 2015-12-02 ENCOUNTER — Observation Stay (HOSPITAL_COMMUNITY): Payer: Medicare Other | Admitting: Anesthesiology

## 2015-12-02 ENCOUNTER — Inpatient Hospital Stay (HOSPITAL_COMMUNITY)
Admission: AD | Admit: 2015-12-02 | Discharge: 2015-12-07 | DRG: 856 | Disposition: A | Discharge status: Skilled Nursing Facility | Payer: Medicare Other | Source: Ambulatory Visit | Attending: Neurosurgery | Admitting: Neurosurgery

## 2015-12-02 ENCOUNTER — Encounter (HOSPITAL_COMMUNITY)
Admission: AD | Disposition: A | Discharge status: Skilled Nursing Facility | Payer: Self-pay | Source: Ambulatory Visit | Attending: Neurosurgery

## 2015-12-02 DIAGNOSIS — I2510 Atherosclerotic heart disease of native coronary artery without angina pectoris: Secondary | ICD-10-CM | POA: Diagnosis present

## 2015-12-02 DIAGNOSIS — T814XXA Infection following a procedure, initial encounter: Principal | ICD-10-CM | POA: Diagnosis present

## 2015-12-02 DIAGNOSIS — I8390 Asymptomatic varicose veins of unspecified lower extremity: Secondary | ICD-10-CM | POA: Diagnosis present

## 2015-12-02 DIAGNOSIS — F329 Major depressive disorder, single episode, unspecified: Secondary | ICD-10-CM | POA: Diagnosis present

## 2015-12-02 DIAGNOSIS — I10 Essential (primary) hypertension: Secondary | ICD-10-CM | POA: Diagnosis present

## 2015-12-02 DIAGNOSIS — T8149XA Infection following a procedure, other surgical site, initial encounter: Secondary | ICD-10-CM | POA: Diagnosis present

## 2015-12-02 DIAGNOSIS — Z8249 Family history of ischemic heart disease and other diseases of the circulatory system: Secondary | ICD-10-CM

## 2015-12-02 DIAGNOSIS — G4700 Insomnia, unspecified: Secondary | ICD-10-CM | POA: Diagnosis present

## 2015-12-02 DIAGNOSIS — Z885 Allergy status to narcotic agent status: Secondary | ICD-10-CM

## 2015-12-02 DIAGNOSIS — A498 Other bacterial infections of unspecified site: Secondary | ICD-10-CM

## 2015-12-02 DIAGNOSIS — E039 Hypothyroidism, unspecified: Secondary | ICD-10-CM | POA: Diagnosis present

## 2015-12-02 DIAGNOSIS — Z79899 Other long term (current) drug therapy: Secondary | ICD-10-CM

## 2015-12-02 DIAGNOSIS — Z888 Allergy status to other drugs, medicaments and biological substances status: Secondary | ICD-10-CM

## 2015-12-02 DIAGNOSIS — G8929 Other chronic pain: Secondary | ICD-10-CM | POA: Diagnosis present

## 2015-12-02 DIAGNOSIS — Y838 Other surgical procedures as the cause of abnormal reaction of the patient, or of later complication, without mention of misadventure at the time of the procedure: Secondary | ICD-10-CM | POA: Diagnosis present

## 2015-12-02 DIAGNOSIS — L02212 Cutaneous abscess of back [any part, except buttock]: Secondary | ICD-10-CM | POA: Diagnosis present

## 2015-12-02 DIAGNOSIS — Z7982 Long term (current) use of aspirin: Secondary | ICD-10-CM

## 2015-12-02 DIAGNOSIS — K219 Gastro-esophageal reflux disease without esophagitis: Secondary | ICD-10-CM | POA: Diagnosis present

## 2015-12-02 DIAGNOSIS — E119 Type 2 diabetes mellitus without complications: Secondary | ICD-10-CM | POA: Diagnosis present

## 2015-12-02 DIAGNOSIS — L089 Local infection of the skin and subcutaneous tissue, unspecified: Secondary | ICD-10-CM

## 2015-12-02 DIAGNOSIS — A4151 Sepsis due to Escherichia coli [E. coli]: Secondary | ICD-10-CM | POA: Diagnosis present

## 2015-12-02 DIAGNOSIS — G4733 Obstructive sleep apnea (adult) (pediatric): Secondary | ICD-10-CM | POA: Diagnosis present

## 2015-12-02 DIAGNOSIS — Z794 Long term (current) use of insulin: Secondary | ICD-10-CM

## 2015-12-02 DIAGNOSIS — Z880 Allergy status to penicillin: Secondary | ICD-10-CM

## 2015-12-02 DIAGNOSIS — L719 Rosacea, unspecified: Secondary | ICD-10-CM | POA: Diagnosis present

## 2015-12-02 DIAGNOSIS — E785 Hyperlipidemia, unspecified: Secondary | ICD-10-CM | POA: Diagnosis present

## 2015-12-02 DIAGNOSIS — G2581 Restless legs syndrome: Secondary | ICD-10-CM | POA: Diagnosis present

## 2015-12-02 DIAGNOSIS — T148XXA Other injury of unspecified body region, initial encounter: Secondary | ICD-10-CM

## 2015-12-02 DIAGNOSIS — K5900 Constipation, unspecified: Secondary | ICD-10-CM | POA: Diagnosis present

## 2015-12-02 DIAGNOSIS — M419 Scoliosis, unspecified: Secondary | ICD-10-CM | POA: Diagnosis present

## 2015-12-02 DIAGNOSIS — Z86010 Personal history of colonic polyps: Secondary | ICD-10-CM

## 2015-12-02 HISTORY — PX: LUMBAR WOUND DEBRIDEMENT: SHX1988

## 2015-12-02 LAB — COMPREHENSIVE METABOLIC PANEL
ALT: 14 U/L (ref 14–54)
ANION GAP: 11 (ref 5–15)
AST: 14 U/L — ABNORMAL LOW (ref 15–41)
Albumin: 2.5 g/dL — ABNORMAL LOW (ref 3.5–5.0)
Alkaline Phosphatase: 101 U/L (ref 38–126)
BILIRUBIN TOTAL: 0.6 mg/dL (ref 0.3–1.2)
BUN: 11 mg/dL (ref 6–20)
CO2: 26 mmol/L (ref 22–32)
Calcium: 8.7 mg/dL — ABNORMAL LOW (ref 8.9–10.3)
Chloride: 97 mmol/L — ABNORMAL LOW (ref 101–111)
Creatinine, Ser: 1.17 mg/dL — ABNORMAL HIGH (ref 0.44–1.00)
GFR calc Af Amer: 53 mL/min — ABNORMAL LOW (ref >60)
GFR, EST NON AFRICAN AMERICAN: 46 mL/min — AB (ref >60)
Glucose, Bld: 230 mg/dL — ABNORMAL HIGH (ref 65–99)
POTASSIUM: 3.9 mmol/L (ref 3.5–5.1)
Sodium: 134 mmol/L — ABNORMAL LOW (ref 135–145)
TOTAL PROTEIN: 6.8 g/dL (ref 6.5–8.1)

## 2015-12-02 LAB — CBC WITH DIFFERENTIAL/PLATELET
BASOS PCT: 0 %
Basophils Absolute: 0 10*3/uL (ref 0.0–0.1)
EOS PCT: 0 %
Eosinophils Absolute: 0 10*3/uL (ref 0.0–0.7)
HEMATOCRIT: 33 % — AB (ref 36.0–46.0)
Hemoglobin: 10.4 g/dL — ABNORMAL LOW (ref 12.0–15.0)
LYMPHS PCT: 18 %
Lymphs Abs: 1.7 10*3/uL (ref 0.7–4.0)
MCH: 27.4 pg (ref 26.0–34.0)
MCHC: 31.5 g/dL (ref 30.0–36.0)
MCV: 87.1 fL (ref 78.0–100.0)
MONO ABS: 0.9 10*3/uL (ref 0.1–1.0)
MONOS PCT: 10 %
NEUTROS ABS: 6.8 10*3/uL (ref 1.7–7.7)
Neutrophils Relative %: 72 %
PLATELETS: 413 10*3/uL — AB (ref 150–400)
RBC: 3.79 MIL/uL — ABNORMAL LOW (ref 3.87–5.11)
RDW: 13.5 % (ref 11.5–15.5)
WBC: 9.4 10*3/uL (ref 4.0–10.5)

## 2015-12-02 LAB — GLUCOSE, CAPILLARY
GLUCOSE-CAPILLARY: 208 mg/dL — AB (ref 65–99)
GLUCOSE-CAPILLARY: 211 mg/dL — AB (ref 65–99)
Glucose-Capillary: 192 mg/dL — ABNORMAL HIGH (ref 65–99)

## 2015-12-02 SURGERY — LUMBAR WOUND DEBRIDEMENT
Anesthesia: General | Site: Spine Lumbar

## 2015-12-02 MED ORDER — PHENYLEPHRINE 40 MCG/ML (10ML) SYRINGE FOR IV PUSH (FOR BLOOD PRESSURE SUPPORT)
PREFILLED_SYRINGE | INTRAVENOUS | Status: AC
  Filled 2015-12-02: qty 10

## 2015-12-02 MED ORDER — ONDANSETRON HCL 4 MG/2ML IJ SOLN: 4.0000 mg | Freq: Once | INTRAMUSCULAR | Status: DC | PRN

## 2015-12-02 MED ORDER — FENTANYL CITRATE (PF) 100 MCG/2ML IJ SOLN
INTRAMUSCULAR | Status: DC | PRN
  Administered 2015-12-02 (×4): 50 ug via INTRAVENOUS

## 2015-12-02 MED ORDER — HYDROXYZINE HCL 25 MG PO TABS
50.0000 mg | ORAL_TABLET | Freq: Two times a day (BID) | ORAL | Status: DC
  Administered 2015-12-03 – 2015-12-07 (×9): 50 mg via ORAL
  Filled 2015-12-02 (×9): qty 2

## 2015-12-02 MED ORDER — INSULIN ASPART 100 UNIT/ML ~~LOC~~ SOLN
16.0000 [IU] | Freq: Every day | SUBCUTANEOUS | Status: DC
  Administered 2015-12-04: 16 [IU] via SUBCUTANEOUS

## 2015-12-02 MED ORDER — INSULIN ASPART 100 UNIT/ML ~~LOC~~ SOLN
10.0000 [IU] | Freq: Two times a day (BID) | SUBCUTANEOUS | Status: DC
  Administered 2015-12-03 – 2015-12-05 (×5): 10 [IU] via SUBCUTANEOUS

## 2015-12-02 MED ORDER — DIAZEPAM 5 MG PO TABS
5.0000 mg | ORAL_TABLET | Freq: Four times a day (QID) | ORAL | Status: DC | PRN
  Administered 2015-12-03: 5 mg via ORAL
  Filled 2015-12-02: qty 1

## 2015-12-02 MED ORDER — THROMBIN 5000 UNITS EX SOLR
CUTANEOUS | Status: AC
  Filled 2015-12-02: qty 10000

## 2015-12-02 MED ORDER — INSULIN GLARGINE 100 UNIT/ML SOLOSTAR PEN: 16.0000 [IU] | PEN_INJECTOR | Freq: Every day | SUBCUTANEOUS | Status: DC

## 2015-12-02 MED ORDER — SUGAMMADEX SODIUM 200 MG/2ML IV SOLN
INTRAVENOUS | Status: DC | PRN
  Administered 2015-12-02: 100 mg via INTRAVENOUS

## 2015-12-02 MED ORDER — HYDROMORPHONE HCL 2 MG/ML IJ SOLN
0.2500 mg | INTRAMUSCULAR | Status: DC | PRN
  Administered 2015-12-02 (×4): 0.5 mg via INTRAVENOUS

## 2015-12-02 MED ORDER — VITAMIN D 1000 UNITS PO TABS
1000.0000 [IU] | ORAL_TABLET | Freq: Every day | ORAL | Status: DC
  Administered 2015-12-03 – 2015-12-07 (×5): 1000 [IU] via ORAL
  Filled 2015-12-02 (×5): qty 1

## 2015-12-02 MED ORDER — ACETAMINOPHEN 650 MG RE SUPP
650.0000 mg | RECTAL | Status: DC | PRN
Start: 2015-12-02 — End: 2015-12-07

## 2015-12-02 MED ORDER — PANTOPRAZOLE SODIUM 40 MG PO TBEC
40.0000 mg | DELAYED_RELEASE_TABLET | Freq: Every day | ORAL | Status: DC
  Administered 2015-12-02 – 2015-12-07 (×6): 40 mg via ORAL
  Filled 2015-12-02 (×6): qty 1

## 2015-12-02 MED ORDER — SODIUM CHLORIDE 0.9% FLUSH: 3.0000 mL | INTRAVENOUS | Status: DC | PRN

## 2015-12-02 MED ORDER — ROPINIROLE HCL 1 MG PO TABS: 5.0000 mg | ORAL_TABLET | Freq: Two times a day (BID) | ORAL | Status: DC

## 2015-12-02 MED ORDER — OXYCODONE HCL 5 MG PO TABS
5.0000 mg | ORAL_TABLET | Freq: Once | ORAL | Status: AC | PRN
  Administered 2015-12-02: 5 mg via ORAL

## 2015-12-02 MED ORDER — LEVOTHYROXINE SODIUM 75 MCG PO TABS
75.0000 ug | ORAL_TABLET | Freq: Every day | ORAL | Status: DC
  Administered 2015-12-03 – 2015-12-07 (×5): 75 ug via ORAL
  Filled 2015-12-02 (×5): qty 1

## 2015-12-02 MED ORDER — LACTATED RINGERS IV SOLN
INTRAVENOUS | Status: DC | PRN
  Administered 2015-12-02: 16:00:00 via INTRAVENOUS

## 2015-12-02 MED ORDER — SENNA 8.6 MG PO TABS
1.0000 | ORAL_TABLET | Freq: Two times a day (BID) | ORAL | Status: DC
  Administered 2015-12-02 – 2015-12-07 (×10): 8.6 mg via ORAL
  Filled 2015-12-02 (×10): qty 1

## 2015-12-02 MED ORDER — LIDOCAINE 2% (20 MG/ML) 5 ML SYRINGE
INTRAMUSCULAR | Status: AC
  Filled 2015-12-02: qty 5

## 2015-12-02 MED ORDER — PROPOFOL 10 MG/ML IV BOLUS
INTRAVENOUS | Status: DC | PRN
  Administered 2015-12-02: 150 mg via INTRAVENOUS
  Administered 2015-12-02: 30 mg via INTRAVENOUS

## 2015-12-02 MED ORDER — SODIUM CHLORIDE 0.9 % IR SOLN
Status: DC | PRN
  Administered 2015-12-02 (×2): 500 mL

## 2015-12-02 MED ORDER — NITROGLYCERIN 0.4 MG SL SUBL: 0.4000 mg | SUBLINGUAL_TABLET | SUBLINGUAL | Status: DC | PRN

## 2015-12-02 MED ORDER — EST ESTROGENS-METHYLTEST 0.625-1.25 MG PO TABS: 1.0000 | ORAL_TABLET | Freq: Every day | ORAL | Status: DC

## 2015-12-02 MED ORDER — LIDOCAINE HCL (CARDIAC) 20 MG/ML IV SOLN
INTRAVENOUS | Status: DC | PRN
  Administered 2015-12-02: 60 mg via INTRAVENOUS

## 2015-12-02 MED ORDER — ROCURONIUM BROMIDE 10 MG/ML (PF) SYRINGE
PREFILLED_SYRINGE | INTRAVENOUS | Status: AC
  Filled 2015-12-02: qty 10

## 2015-12-02 MED ORDER — VANCOMYCIN HCL IN DEXTROSE 750-5 MG/150ML-% IV SOLN
750.0000 mg | Freq: Two times a day (BID) | INTRAVENOUS | Status: DC
  Administered 2015-12-03: 750 mg via INTRAVENOUS
  Filled 2015-12-02 (×2): qty 150

## 2015-12-02 MED ORDER — DULOXETINE HCL 60 MG PO CPEP
60.0000 mg | ORAL_CAPSULE | Freq: Every day | ORAL | Status: DC
  Administered 2015-12-03 – 2015-12-07 (×5): 60 mg via ORAL
  Filled 2015-12-02 (×5): qty 1

## 2015-12-02 MED ORDER — LISINOPRIL 10 MG PO TABS
10.0000 mg | ORAL_TABLET | Freq: Two times a day (BID) | ORAL | Status: DC
  Administered 2015-12-03 – 2015-12-07 (×7): 10 mg via ORAL
  Filled 2015-12-02 (×8): qty 1

## 2015-12-02 MED ORDER — DOCUSATE SODIUM 100 MG PO CAPS
100.0000 mg | ORAL_CAPSULE | Freq: Two times a day (BID) | ORAL | Status: DC
  Administered 2015-12-02 – 2015-12-07 (×10): 100 mg via ORAL
  Filled 2015-12-02 (×10): qty 1

## 2015-12-02 MED ORDER — TAPENTADOL HCL 50 MG PO TABS: 50.0000 mg | ORAL_TABLET | Freq: Three times a day (TID) | ORAL | Status: DC | PRN

## 2015-12-02 MED ORDER — INSULIN ASPART 100 UNIT/ML FLEXPEN: 10.0000 [IU] | PEN_INJECTOR | SUBCUTANEOUS | Status: DC

## 2015-12-02 MED ORDER — HYDROMORPHONE HCL 1 MG/ML IJ SOLN
0.5000 mg | INTRAMUSCULAR | Status: DC | PRN
  Administered 2015-12-03 – 2015-12-05 (×2): 1 mg via INTRAVENOUS
  Filled 2015-12-02 (×2): qty 1

## 2015-12-02 MED ORDER — FENTANYL CITRATE (PF) 100 MCG/2ML IJ SOLN
INTRAMUSCULAR | Status: AC
  Filled 2015-12-02: qty 2

## 2015-12-02 MED ORDER — EPHEDRINE 5 MG/ML INJ
INTRAVENOUS | Status: AC
  Filled 2015-12-02: qty 10

## 2015-12-02 MED ORDER — PHENOL 1.4 % MT LIQD: 1.0000 | OROMUCOSAL | Status: DC | PRN

## 2015-12-02 MED ORDER — TRAMADOL HCL 50 MG PO TABS
50.0000 mg | ORAL_TABLET | Freq: Four times a day (QID) | ORAL | Status: DC | PRN
  Administered 2015-12-04 (×3): 50 mg via ORAL
  Filled 2015-12-02 (×3): qty 1

## 2015-12-02 MED ORDER — OXYCODONE HCL 5 MG PO TABS
15.0000 mg | ORAL_TABLET | ORAL | Status: DC | PRN
Start: 2015-12-02 — End: 2015-12-07
  Administered 2015-12-02 – 2015-12-07 (×7): 15 mg via ORAL
  Filled 2015-12-02 (×8): qty 3

## 2015-12-02 MED ORDER — BACITRACIN ZINC 500 UNIT/GM EX OINT
TOPICAL_OINTMENT | CUTANEOUS | Status: AC
  Filled 2015-12-02: qty 28.35

## 2015-12-02 MED ORDER — OXYCODONE HCL 5 MG/5ML PO SOLN: 5.0000 mg | Freq: Once | ORAL | Status: AC | PRN

## 2015-12-02 MED ORDER — HYDROMORPHONE HCL 2 MG/ML IJ SOLN: 0.2500 mg | INTRAMUSCULAR | Status: DC | PRN

## 2015-12-02 MED ORDER — FUROSEMIDE 20 MG PO TABS: 20.0000 mg | ORAL_TABLET | Freq: Every day | ORAL | Status: DC

## 2015-12-02 MED ORDER — ISOSORBIDE MONONITRATE ER 30 MG PO TB24: 30.0000 mg | ORAL_TABLET | ORAL | Status: DC | PRN

## 2015-12-02 MED ORDER — GABAPENTIN 300 MG PO CAPS
300.0000 mg | ORAL_CAPSULE | Freq: Four times a day (QID) | ORAL | Status: DC
Start: 2015-12-02 — End: 2015-12-07
  Administered 2015-12-02 – 2015-12-07 (×19): 300 mg via ORAL
  Filled 2015-12-02 (×18): qty 1

## 2015-12-02 MED ORDER — VANCOMYCIN HCL 1000 MG IV SOLR
INTRAVENOUS | Status: DC | PRN
  Administered 2015-12-02: 1000 mg via INTRAVENOUS

## 2015-12-02 MED ORDER — METHOCARBAMOL 750 MG PO TABS
750.0000 mg | ORAL_TABLET | Freq: Three times a day (TID) | ORAL | Status: DC | PRN
  Administered 2015-12-02 – 2015-12-07 (×7): 750 mg via ORAL
  Filled 2015-12-02 (×7): qty 1

## 2015-12-02 MED ORDER — TORSEMIDE 20 MG PO TABS
20.0000 mg | ORAL_TABLET | ORAL | Status: DC
  Administered 2015-12-03 – 2015-12-06 (×2): 20 mg via ORAL
  Filled 2015-12-02 (×2): qty 1

## 2015-12-02 MED ORDER — BISACODYL 10 MG RE SUPP: 10.0000 mg | Freq: Every day | RECTAL | Status: DC | PRN

## 2015-12-02 MED ORDER — ONDANSETRON HCL 4 MG/2ML IJ SOLN: 4.0000 mg | INTRAMUSCULAR | Status: DC | PRN

## 2015-12-02 MED ORDER — DEXTROSE 5 % IV SOLN
2.0000 g | Freq: Two times a day (BID) | INTRAVENOUS | Status: DC
  Administered 2015-12-02 – 2015-12-03 (×2): 2 g via INTRAVENOUS
  Filled 2015-12-02 (×3): qty 2

## 2015-12-02 MED ORDER — TEMAZEPAM 15 MG PO CAPS
30.0000 mg | ORAL_CAPSULE | Freq: Every day | ORAL | Status: DC
  Administered 2015-12-03 – 2015-12-06 (×4): 30 mg via ORAL
  Filled 2015-12-02 (×4): qty 2

## 2015-12-02 MED ORDER — SODIUM CHLORIDE 0.9 % IV SOLN
INTRAVENOUS | Status: DC
  Administered 2015-12-02 – 2015-12-04 (×5): via INTRAVENOUS

## 2015-12-02 MED ORDER — METHOCARBAMOL 500 MG PO TABS
ORAL_TABLET | ORAL | Status: AC
  Filled 2015-12-02: qty 2

## 2015-12-02 MED ORDER — PROPOFOL 10 MG/ML IV BOLUS
INTRAVENOUS | Status: AC
  Filled 2015-12-02: qty 20

## 2015-12-02 MED ORDER — SODIUM CHLORIDE 0.9 % IV SOLN: 250.0000 mL | INTRAVENOUS | Status: DC

## 2015-12-02 MED ORDER — ALIROCUMAB 150 MG/ML ~~LOC~~ SOPN: 1.0000 "pen " | PEN_INJECTOR | SUBCUTANEOUS | Status: DC

## 2015-12-02 MED ORDER — SUCCINYLCHOLINE CHLORIDE 20 MG/ML IJ SOLN
INTRAMUSCULAR | Status: DC | PRN
  Administered 2015-12-02: 60 mg via INTRAVENOUS

## 2015-12-02 MED ORDER — MEPERIDINE HCL 25 MG/ML IJ SOLN: 6.2500 mg | INTRAMUSCULAR | Status: DC | PRN

## 2015-12-02 MED ORDER — INSULIN ASPART 100 UNIT/ML ~~LOC~~ SOLN
0.0000 [IU] | Freq: Three times a day (TID) | SUBCUTANEOUS | Status: DC
  Administered 2015-12-03 (×2): 3 [IU] via SUBCUTANEOUS
  Administered 2015-12-03: 4 [IU] via SUBCUTANEOUS
  Administered 2015-12-04: 3 [IU] via SUBCUTANEOUS
  Administered 2015-12-04 (×2): 4 [IU] via SUBCUTANEOUS
  Administered 2015-12-05: 7 [IU] via SUBCUTANEOUS
  Administered 2015-12-06: 11 [IU] via SUBCUTANEOUS
  Administered 2015-12-06 – 2015-12-07 (×2): 4 [IU] via SUBCUTANEOUS
  Administered 2015-12-07: 3 [IU] via SUBCUTANEOUS
  Administered 2015-12-07: 7 [IU] via SUBCUTANEOUS

## 2015-12-02 MED ORDER — ROPINIROLE HCL 1 MG PO TABS
5.0000 mg | ORAL_TABLET | Freq: Two times a day (BID) | ORAL | Status: DC
  Administered 2015-12-02 – 2015-12-07 (×9): 5 mg via ORAL
  Filled 2015-12-02 (×9): qty 5

## 2015-12-02 MED ORDER — ROCURONIUM BROMIDE 100 MG/10ML IV SOLN
INTRAVENOUS | Status: DC | PRN
  Administered 2015-12-02: 20 mg via INTRAVENOUS

## 2015-12-02 MED ORDER — 0.9 % SODIUM CHLORIDE (POUR BTL) OPTIME
TOPICAL | Status: DC | PRN
  Administered 2015-12-02: 1000 mL

## 2015-12-02 MED ORDER — SODIUM CHLORIDE 0.9% FLUSH
3.0000 mL | Freq: Two times a day (BID) | INTRAVENOUS | Status: DC
  Administered 2015-12-02 – 2015-12-06 (×6): 3 mL via INTRAVENOUS

## 2015-12-02 MED ORDER — ACETAMINOPHEN 325 MG PO TABS: 650.0000 mg | ORAL_TABLET | ORAL | Status: DC | PRN

## 2015-12-02 MED ORDER — INSULIN GLARGINE 100 UNIT/ML ~~LOC~~ SOLN
16.0000 [IU] | Freq: Every day | SUBCUTANEOUS | Status: DC
  Administered 2015-12-02 – 2015-12-06 (×5): 16 [IU] via SUBCUTANEOUS
  Filled 2015-12-02 (×6): qty 0.16

## 2015-12-02 MED ORDER — VANCOMYCIN HCL IN DEXTROSE 1-5 GM/200ML-% IV SOLN
INTRAVENOUS | Status: AC
  Filled 2015-12-02: qty 200

## 2015-12-02 MED ORDER — SUCCINYLCHOLINE CHLORIDE 200 MG/10ML IV SOSY
PREFILLED_SYRINGE | INTRAVENOUS | Status: AC
  Filled 2015-12-02: qty 10

## 2015-12-02 MED ORDER — MELOXICAM 7.5 MG PO TABS
15.0000 mg | ORAL_TABLET | Freq: Every day | ORAL | Status: DC
  Administered 2015-12-03 – 2015-12-07 (×5): 15 mg via ORAL
  Filled 2015-12-02 (×5): qty 2

## 2015-12-02 MED ORDER — HYDROMORPHONE HCL 2 MG/ML IJ SOLN
INTRAMUSCULAR | Status: AC
  Filled 2015-12-02: qty 1

## 2015-12-02 MED ORDER — ONDANSETRON HCL 4 MG/2ML IJ SOLN
INTRAMUSCULAR | Status: DC | PRN
  Administered 2015-12-02: 4 mg via INTRAVENOUS

## 2015-12-02 MED ORDER — MENTHOL 3 MG MT LOZG: 1.0000 | LOZENGE | OROMUCOSAL | Status: DC | PRN

## 2015-12-02 MED ORDER — LIDOCAINE-EPINEPHRINE 1 %-1:100000 IJ SOLN
INTRAMUSCULAR | Status: AC
  Filled 2015-12-02: qty 1

## 2015-12-02 MED ORDER — OXYCODONE HCL 5 MG PO TABS
ORAL_TABLET | ORAL | Status: AC
  Filled 2015-12-02: qty 2

## 2015-12-02 MED ORDER — DOCUSATE SODIUM 100 MG PO CAPS: 100.0000 mg | ORAL_CAPSULE | Freq: Every day | ORAL | Status: DC

## 2015-12-02 SURGICAL SUPPLY — 44 items
BAG DECANTER FOR FLEXI CONT (MISCELLANEOUS) ×4 IMPLANT
CANISTER SUCT 3000ML PPV (MISCELLANEOUS) ×2 IMPLANT
DRAPE LAPAROTOMY 100X72X124 (DRAPES) ×2 IMPLANT
DRAPE POUCH INSTRU U-SHP 10X18 (DRAPES) ×2 IMPLANT
DRSG OPSITE 11X17.75 LRG (GAUZE/BANDAGES/DRESSINGS) ×2 IMPLANT
DRSG OPSITE 4X5.5 SM (GAUZE/BANDAGES/DRESSINGS) ×2 IMPLANT
DRSG OPSITE POSTOP 4X8 (GAUZE/BANDAGES/DRESSINGS) ×2 IMPLANT
ELECT REM PT RETURN 9FT ADLT (ELECTROSURGICAL) ×2
ELECTRODE REM PT RTRN 9FT ADLT (ELECTROSURGICAL) ×1 IMPLANT
GAUZE SPONGE 4X4 12PLY STRL (GAUZE/BANDAGES/DRESSINGS) IMPLANT
GAUZE SPONGE 4X4 16PLY XRAY LF (GAUZE/BANDAGES/DRESSINGS) IMPLANT
GLOVE BIOGEL PI IND STRL 6.5 (GLOVE) ×1 IMPLANT
GLOVE BIOGEL PI IND STRL 7.0 (GLOVE) ×1 IMPLANT
GLOVE BIOGEL PI IND STRL 7.5 (GLOVE) ×1 IMPLANT
GLOVE BIOGEL PI INDICATOR 6.5 (GLOVE) ×1
GLOVE BIOGEL PI INDICATOR 7.0 (GLOVE) ×1
GLOVE BIOGEL PI INDICATOR 7.5 (GLOVE) ×1
GLOVE ECLIPSE 6.5 STRL STRAW (GLOVE) ×2 IMPLANT
GLOVE ECLIPSE 7.0 STRL STRAW (GLOVE) ×2 IMPLANT
GLOVE EXAM NITRILE LRG STRL (GLOVE) IMPLANT
GLOVE EXAM NITRILE XL STR (GLOVE) IMPLANT
GLOVE EXAM NITRILE XS STR PU (GLOVE) IMPLANT
GLOVE SURG SS PI 6.5 STRL IVOR (GLOVE) ×4 IMPLANT
GOWN STRL REUS W/ TWL LRG LVL3 (GOWN DISPOSABLE) ×3 IMPLANT
GOWN STRL REUS W/ TWL XL LVL3 (GOWN DISPOSABLE) IMPLANT
GOWN STRL REUS W/TWL 2XL LVL3 (GOWN DISPOSABLE) IMPLANT
GOWN STRL REUS W/TWL LRG LVL3 (GOWN DISPOSABLE) ×3
GOWN STRL REUS W/TWL XL LVL3 (GOWN DISPOSABLE)
KIT BASIN OR (CUSTOM PROCEDURE TRAY) ×2 IMPLANT
KIT ROOM TURNOVER OR (KITS) ×2 IMPLANT
NEEDLE HYPO 22GX1.5 SAFETY (NEEDLE) ×2 IMPLANT
NS IRRIG 1000ML POUR BTL (IV SOLUTION) ×2 IMPLANT
PACK LAMINECTOMY NEURO (CUSTOM PROCEDURE TRAY) ×2 IMPLANT
PAD ARMBOARD 7.5X6 YLW CONV (MISCELLANEOUS) ×10 IMPLANT
SPONGE SURGIFOAM ABS GEL SZ50 (HEMOSTASIS) IMPLANT
STAPLER VISISTAT 35W (STAPLE) ×2 IMPLANT
SUT VIC AB 0 CT1 18XCR BRD8 (SUTURE) ×3 IMPLANT
SUT VIC AB 0 CT1 8-18 (SUTURE) ×6
SUT VICRYL 3-0 RB1 18 ABS (SUTURE) ×2 IMPLANT
SWAB COLLECTION DEVICE MRSA (MISCELLANEOUS) ×4 IMPLANT
SWAB CULTURE ESWAB REG 1ML (MISCELLANEOUS) ×4 IMPLANT
TOWEL OR 17X24 6PK STRL BLUE (TOWEL DISPOSABLE) ×4 IMPLANT
TOWEL OR 17X26 10 PK STRL BLUE (TOWEL DISPOSABLE) ×2 IMPLANT
WATER STERILE IRR 1000ML POUR (IV SOLUTION) ×2 IMPLANT

## 2015-12-02 NOTE — Progress Notes (Signed)
No preop labs needed per Dr. Ermalene Postin

## 2015-12-02 NOTE — Progress Notes (Signed)
Pharmacy Antibiotic Note  [name] is a 70 y.o. female s/p spinal surgery 10/2, re-admitted on 12/02/2015 with fever, yellow-brown drainage from her wound, concern for infection/SIRS.  Pharmacy has been consulted for vancomycin and cefepime dosing. Most recent scr = 1 on 10/3, est. Crcl~ 50-55 ml/min. New BMET, CBC, Urine/blood cultures are pending. Pt had I&D this afternoon. Per procedure note, she received 1g vancomycin at 1630   Plan: Vancomycin 750 mg IV Q 12 hrs, next dose at 0500 Cefepime 2g IV Q 12hrs  Height: 5\' 3"  (160 cm) Weight: 225 lb 5 oz (102.2 kg) IBW/kg (Calculated) : 52.4  Temp (24hrs), Avg:99.9 F (37.7 C), Min:99 F (37.2 C), Max:101.2 F (38.4 C)  No results for input(s): WBC, CREATININE, LATICACIDVEN, VANCOTROUGH, VANCOPEAK, VANCORANDOM, GENTTROUGH, GENTPEAK, GENTRANDOM, TOBRATROUGH, TOBRAPEAK, TOBRARND, AMIKACINPEAK, AMIKACINTROU, AMIKACIN in the last 168 hours.  Estimated Creatinine Clearance: 59.7 mL/min (by C-G formula based on SCr of 1 mg/dL).    Allergies  Allergen Reactions  . Ceftin [Cefuroxime Axetil] Diarrhea    diarrhea  . Codeine Rash       . Penicillins Rash  . Vicodin [Hydrocodone-Acetaminophen] Rash     Also passes out    Antimicrobials this admission: Vancomycin 10/20 >>  Cefepime 10/20 >>   Dose adjustments this admission:   Microbiology results: 10/20 BCx:  10/20 UCx:  10/20 wound:    Thank you for allowing pharmacy to be a part of this patient's care.  Maryanna Shape, PharmD, BCPS  Clinical Pharmacist  Pager: [phone]   12/02/2015 8:05 PM

## 2015-12-02 NOTE — Anesthesia Preprocedure Evaluation (Signed)
Anesthesia Evaluation  Patient identified by MRN, date of birth, ID band Patient awake    Reviewed: Allergy & Precautions, H&P , NPO status , Patient's Chart, lab work & pertinent test results, reviewed documented beta blocker date and time   Airway Mallampati: IV  TM Distance: >3 FB Neck ROM: Full    Dental no notable dental hx. (+) Teeth Intact, Dental Advisory Given   Pulmonary sleep apnea and Continuous Positive Airway Pressure Ventilation ,    Pulmonary exam normal breath sounds clear to auscultation       Cardiovascular hypertension, Pt. on medications and Pt. on home beta blockers  Rhythm:Regular Rate:Normal     Neuro/Psych Anxiety Depression negative neurological ROS     GI/Hepatic Neg liver ROS, GERD  Medicated and Controlled,  Endo/Other  diabetes, Insulin DependentHypothyroidism Morbid obesity  Renal/GU negative Renal ROS  negative genitourinary   Musculoskeletal  (+) Arthritis , Osteoarthritis,    Abdominal   Peds  Hematology negative hematology ROS (+)   Anesthesia Other Findings   Reproductive/Obstetrics negative OB ROS                             Anesthesia Physical Anesthesia Plan  ASA: III  Anesthesia Plan: General   Post-op Pain Management:    Induction: Intravenous  Airway Management Planned: Oral ETT  Additional Equipment: None  Intra-op Plan:   Post-operative Plan: Extubation in OR  Informed Consent: I have reviewed the patients History and Physical, chart, labs and discussed the procedure including the risks, benefits and alternatives for the proposed anesthesia with the patient or authorized representative who has indicated his/her understanding and acceptance.   Dental advisory given  Plan Discussed with: CRNA and Surgeon  Anesthesia Plan Comments:         Anesthesia Quick Evaluation

## 2015-12-02 NOTE — Transfer of Care (Signed)
Immediate Anesthesia Transfer of Care Note  Patient: [name]  Procedure(s) Performed: Procedure(s): WOUND Exploration (N/A)  Patient Location: PACU  Anesthesia Type:General  Level of Consciousness: awake, alert , oriented and patient cooperative  Airway & Oxygen Therapy: Patient Spontanous Breathing and Patient connected to nasal cannula oxygen  Post-op Assessment: Report given to RN, Post -op Vital signs reviewed and stable, Patient moving all extremities and Patient moving all extremities X 4  Post vital signs: Reviewed and stable  Last Vitals:  Vitals:   12/02/15 1514 12/02/15 1800  BP: (!) 145/61   Pulse: (!) 112   Resp: 20   Temp: 37.5 C (!) 38.4 C    Last Pain:  Vitals:   12/02/15 1515  TempSrc:   PainSc: 10-Worst pain ever         Complications: No apparent anesthesia complications

## 2015-12-02 NOTE — H&P (Signed)
CC:  Wound drainage, fever, malaise  HPI: [name] is a 70 y.o. female who underwent L3-S1 fusion on 10/2 (18 days ago) and had a relatively uneventful hospital course. She was discharged to SNF, and has been home for a few days. She was seen this past Tuesday, 4 days ago, with low-grade fevers and no significant drainage. Unfortunately, over the last 24 hours she has been having fever > 101.5, developed severe HA this morning, and has had yellow-brown drainage from her wound. She was seen in the office today and brought to the hospital.  PMH: Past Medical History:  Diagnosis Date  . Arthritis   . Chronic back pain    stenosis.degenerative disc,some scoliosis  . Constipation    takes Stool Softener daily  . Coronary artery disease   . Depression    takes Cymbalta daily  . Diabetes mellitus    Type 2 diabetic. Average fasting blood sugar runs high 170-200  . Diverticulosis   . GERD (gastroesophageal reflux disease)    takes Nexium daily  . Hemorrhoids   . History of colon polyps    benign  . History of gout    doesn't take any meds  . History of vertigo    doesn't take any meds  . Hyperlipidemia    takes Praluent daily  . Hypertension    takes Imdur,Lisinopril,and Metoprolol daily  . Hypothyroidism    takes Synthroid daily  . Insomnia    takes Restoril nightly  . Joint pain   . Joint swelling   . Muscle spasm    takes Robaxin as needed  . OSA on CPAP   . Peripheral edema    takes LAsix as needed  . Restless leg    takes Requip daily  . Rosacea   . Sleep apnea   . Varicose veins   . Weakness    numbness and tingling.    PSH: Past Surgical History:  Procedure Laterality Date  . ABDOMINAL HYSTERECTOMY     with BSo  . CARDIAC CATHETERIZATION  2013   Normal  . CARPAL TUNNEL RELEASE Bilateral   . CATARACT EXTRACTION, BILATERAL    . CHOLECYSTECTOMY    . COLONOSCOPY    . DIAGNOSTIC LAPAROSCOPY     multiple times  . DILATION AND CURETTAGE OF UTERUS    .  ESOPHAGOGASTRODUODENOSCOPY (EGD) WITH PROPOFOL N/A 11/01/2014   Procedure: ESOPHAGOGASTRODUODENOSCOPY (EGD) WITH PROPOFOL;  Surgeon: Hulen Luster, MD;  Location: [facility] ENDOSCOPY;  Service: Gastroenterology;  Laterality: N/A;  . IMPLANTABLE CONTACT LENS IMPLANTATION     bilateral  . LAMINECTOMY  11/13/2015  . ROTATOR CUFF REPAIR Left   . SAVORY DILATION N/A 11/01/2014   Procedure: SAVORY DILATION;  Surgeon: Hulen Luster, MD;  Location: [facility] ENDOSCOPY;  Service: Gastroenterology;  Laterality: N/A;  . TONSILLECTOMY    . TRIGGER FINGER RELEASE Bilateral     SH: Social History  Substance Use Topics  . Smoking status: Never Smoker  . Smokeless tobacco: Never Used  . Alcohol use No    MEDS: Prior to Admission medications   Medication Sig Start Date End Date Taking? Authorizing Provider  Alirocumab (PRALUENT) 150 MG/ML SOPN Inject 1 pen into the skin every 14 (fourteen) days. 09/07/14   Minna Merritts, MD  aspirin 81 MG tablet Take 81 mg by mouth daily.      Historical Provider, MD  cholecalciferol (VITAMIN D) 1000 UNITS tablet Take 1,000 Units by mouth daily.    Historical Provider, MD  diazepam (VALIUM) 5 MG tablet Take 1 tablet (5 mg total) by mouth every 6 (six) hours as needed for muscle spasms. 11/18/15   Erline Levine, MD  docusate sodium (COLACE) 100 MG capsule Take 100 mg by mouth daily.    Historical Provider, MD  DULoxetine (CYMBALTA) 60 MG capsule Take 60 mg by mouth daily.    Historical Provider, MD  esomeprazole (NEXIUM) 40 MG capsule Take 40 mg by mouth 3 (three) times daily.     Historical Provider, MD  estrogen-methylTESTOSTERone 0.625-1.25 MG per tablet Take 1 tablet by mouth daily.    Historical Provider, MD  furosemide (LASIX) 20 MG tablet Take 20 mg by mouth daily.    Historical Provider, MD  gabapentin (NEURONTIN) 300 MG capsule Take 300 mg by mouth 4 (four) times daily. Take 300 mg tablets 3-4 times daily.    Historical Provider, MD  hydrOXYzine (ATARAX/VISTARIL) 50 MG tablet  Take 50 mg by mouth 2 (two) times daily.  05/26/12   Historical Provider, MD  insulin aspart (NOVOLOG) 100 UNIT/ML FlexPen Inject 10 Units into the skin See admin instructions. 10 units before breakfast, 10 units before lunch, 16 units at dinner, >200 increase by 2 units per sliding scale 03/01/15 02/29/16  Historical Provider, MD  isosorbide mononitrate (IMDUR) 30 MG 24 hr tablet Take 30 mg by mouth as needed. 07/25/11 09/08/15  Minna Merritts, MD  LANTUS SOLOSTAR 100 UNIT/ML Solostar Pen 32 units in am 02/23/15   Historical Provider, MD  levothyroxine (SYNTHROID, LEVOTHROID) 75 MCG tablet Take 75 mcg by mouth daily before breakfast.  11/24/12   Historical Provider, MD  lisinopril (PRINIVIL,ZESTRIL) 20 MG tablet Take 1 tablet (20 mg total) by mouth daily. Patient taking differently: Take 10 mg by mouth 2 (two) times daily.  02/16/14   Minna Merritts, MD  meloxicam (MOBIC) 15 MG tablet Take 15 mg by mouth daily.    Historical Provider, MD  methocarbamol (ROBAXIN) 750 MG tablet Take 750 mg by mouth 3 (three) times daily as needed for muscle spasms. Take 1/2 to 1 tablet three times a day as needed.    Historical Provider, MD  metoprolol succinate (TOPROL XL) 25 MG 24 hr tablet Take 1 tablet (25 mg total) by mouth daily. 03/11/15   Minna Merritts, MD  nitroGLYCERIN (NITROSTAT) 0.4 MG SL tablet Place 1 tablet (0.4 mg total) under the tongue every 5 (five) minutes as needed for chest pain. 02/24/14 09/08/15  Minna Merritts, MD  propranolol (INDERAL) 20 MG tablet TAKE 1 TABLET THREE TIMES A DAY AS NEEDED Patient not taking: Reported on 11/02/2015 07/21/15   Minna Merritts, MD  ropinirole (REQUIP) 5 MG tablet Take 5 mg by mouth 2 (two) times daily.    Historical Provider, MD  tapentadol (NUCYNTA) 50 MG tablet Take 50 mg by mouth 3 (three) times daily as needed for moderate pain.     Historical Provider, MD  Tapentadol HCl 100 MG TABS Take 1 tablet (100 mg total) by mouth every 4 (four) hours as needed for  moderate pain. 11/18/15   Erline Levine, MD  temazepam (RESTORIL) 30 MG capsule Take 30 mg by mouth at bedtime.    Historical Provider, MD  torsemide (DEMADEX) 20 MG tablet Take one tablet every third day.    Historical Provider, MD  traMADol (ULTRAM) 50 MG tablet Take 50 mg by mouth 3 (three) times daily as needed for moderate pain.    Historical Provider, MD  traMADol (ULTRAM) 50 MG  tablet Take 1 tablet (50 mg total) by mouth every 6 (six) hours as needed for moderate pain. 11/18/15   Erline Levine, MD    ALLERGY: Allergies  Allergen Reactions  . Ceftin [Cefuroxime Axetil] Diarrhea    diarrhea  . Codeine Rash       . Penicillins Rash  . Vicodin [Hydrocodone-Acetaminophen] Rash     Also passes out    ROS: ROS  NEUROLOGIC EXAM: In moderate distress, Drowsy but arousable Generalized weakness but moves all extremities symmetrically Wound is erythematous, tender to palpation, and purulent fluid is expressible from the wound  IMPRESSION: - 70 y.o. female with wound infection, concern for SIRS/Sepsis  PLAN: - Will get blood/urine cx and start empiric IV abx - Check CBC/BMET - OR for wound washout/closure

## 2015-12-02 NOTE — Progress Notes (Signed)
Pt admitted from PACU Post op, alert, c/o of pain with a rating of 6, medicated in PACU, pt settled in bed with family and call light at bedside, v/s stable, pt reassured and will continue to monitor. Obasogie-Asidi, Jefferson Fullam Efe

## 2015-12-02 NOTE — Progress Notes (Signed)
Patient arrived to room 5M10 at 1510 via wheelchair with family at side. Patient crying and in severe pain. While transferring patient from chair to bed, OR called and was on their way to pick up patient. Patient is on her way to OR at this time.

## 2015-12-02 NOTE — Anesthesia Procedure Notes (Signed)
Procedure Name: Intubation Date/Time: 12/02/2015 4:11 PM Performed by: Izora Gala Pre-anesthesia Checklist: Patient identified, Emergency Drugs available, Suction available and Patient being monitored Patient Re-evaluated:Patient Re-evaluated prior to inductionOxygen Delivery Method: Circle system utilized Preoxygenation: Pre-oxygenation with 100% oxygen Intubation Type: IV induction Ventilation: Mask ventilation without difficulty Laryngoscope Size: Miller and 3 Grade View: Grade III Tube type: Oral Tube size: 7.5 mm Number of attempts: 1 Airway Equipment and Method: Bite block and LTA kit utilized Placement Confirmation: ETT inserted through vocal cords under direct vision,  positive ETCO2 and breath sounds checked- equal and bilateral Secured at: 21 cm Dental Injury: Teeth and Oropharynx as per pre-operative assessment

## 2015-12-03 ENCOUNTER — Encounter (HOSPITAL_COMMUNITY): Payer: Self-pay | Admitting: *Deleted

## 2015-12-03 DIAGNOSIS — G8929 Other chronic pain: Secondary | ICD-10-CM | POA: Diagnosis present

## 2015-12-03 DIAGNOSIS — Y838 Other surgical procedures as the cause of abnormal reaction of the patient, or of later complication, without mention of misadventure at the time of the procedure: Secondary | ICD-10-CM

## 2015-12-03 DIAGNOSIS — Z794 Long term (current) use of insulin: Secondary | ICD-10-CM | POA: Diagnosis not present

## 2015-12-03 DIAGNOSIS — G2581 Restless legs syndrome: Secondary | ICD-10-CM | POA: Diagnosis present

## 2015-12-03 DIAGNOSIS — A4151 Sepsis due to Escherichia coli [E. coli]: Secondary | ICD-10-CM | POA: Diagnosis present

## 2015-12-03 DIAGNOSIS — K5900 Constipation, unspecified: Secondary | ICD-10-CM | POA: Diagnosis present

## 2015-12-03 DIAGNOSIS — Z881 Allergy status to other antibiotic agents status: Secondary | ICD-10-CM

## 2015-12-03 DIAGNOSIS — B9620 Unspecified Escherichia coli [E. coli] as the cause of diseases classified elsewhere: Secondary | ICD-10-CM | POA: Diagnosis not present

## 2015-12-03 DIAGNOSIS — Z981 Arthrodesis status: Secondary | ICD-10-CM

## 2015-12-03 DIAGNOSIS — Z885 Allergy status to narcotic agent status: Secondary | ICD-10-CM | POA: Diagnosis not present

## 2015-12-03 DIAGNOSIS — A498 Other bacterial infections of unspecified site: Secondary | ICD-10-CM

## 2015-12-03 DIAGNOSIS — I8390 Asymptomatic varicose veins of unspecified lower extremity: Secondary | ICD-10-CM | POA: Diagnosis present

## 2015-12-03 DIAGNOSIS — G4700 Insomnia, unspecified: Secondary | ICD-10-CM | POA: Diagnosis present

## 2015-12-03 DIAGNOSIS — E785 Hyperlipidemia, unspecified: Secondary | ICD-10-CM | POA: Diagnosis present

## 2015-12-03 DIAGNOSIS — S31000D Unspecified open wound of lower back and pelvis without penetration into retroperitoneum, subsequent encounter: Secondary | ICD-10-CM | POA: Diagnosis not present

## 2015-12-03 DIAGNOSIS — E039 Hypothyroidism, unspecified: Secondary | ICD-10-CM | POA: Diagnosis present

## 2015-12-03 DIAGNOSIS — Z7982 Long term (current) use of aspirin: Secondary | ICD-10-CM | POA: Diagnosis not present

## 2015-12-03 DIAGNOSIS — M419 Scoliosis, unspecified: Secondary | ICD-10-CM | POA: Diagnosis present

## 2015-12-03 DIAGNOSIS — G4733 Obstructive sleep apnea (adult) (pediatric): Secondary | ICD-10-CM | POA: Diagnosis present

## 2015-12-03 DIAGNOSIS — I2510 Atherosclerotic heart disease of native coronary artery without angina pectoris: Secondary | ICD-10-CM | POA: Diagnosis present

## 2015-12-03 DIAGNOSIS — Z880 Allergy status to penicillin: Secondary | ICD-10-CM | POA: Diagnosis not present

## 2015-12-03 DIAGNOSIS — T814XXA Infection following a procedure, initial encounter: Secondary | ICD-10-CM | POA: Diagnosis present

## 2015-12-03 DIAGNOSIS — K219 Gastro-esophageal reflux disease without esophagitis: Secondary | ICD-10-CM | POA: Diagnosis present

## 2015-12-03 DIAGNOSIS — T814XXD Infection following a procedure, subsequent encounter: Secondary | ICD-10-CM

## 2015-12-03 DIAGNOSIS — L02212 Cutaneous abscess of back [any part, except buttock]: Secondary | ICD-10-CM | POA: Diagnosis not present

## 2015-12-03 DIAGNOSIS — Z888 Allergy status to other drugs, medicaments and biological substances status: Secondary | ICD-10-CM | POA: Diagnosis not present

## 2015-12-03 DIAGNOSIS — Z79899 Other long term (current) drug therapy: Secondary | ICD-10-CM | POA: Diagnosis not present

## 2015-12-03 DIAGNOSIS — E119 Type 2 diabetes mellitus without complications: Secondary | ICD-10-CM | POA: Diagnosis present

## 2015-12-03 DIAGNOSIS — Z86010 Personal history of colonic polyps: Secondary | ICD-10-CM | POA: Diagnosis not present

## 2015-12-03 DIAGNOSIS — I10 Essential (primary) hypertension: Secondary | ICD-10-CM | POA: Diagnosis present

## 2015-12-03 DIAGNOSIS — F329 Major depressive disorder, single episode, unspecified: Secondary | ICD-10-CM | POA: Diagnosis present

## 2015-12-03 LAB — GLUCOSE, CAPILLARY
GLUCOSE-CAPILLARY: 217 mg/dL — AB (ref 65–99)
GLUCOSE-CAPILLARY: 194 mg/dL — AB (ref 65–99)
Glucose-Capillary: 123 mg/dL — ABNORMAL HIGH (ref 65–99)
Glucose-Capillary: 185 mg/dL — ABNORMAL HIGH (ref 65–99)

## 2015-12-03 MED ORDER — DEXTROSE 5 % IV SOLN
2.0000 g | INTRAVENOUS | Status: DC
  Administered 2015-12-03 – 2015-12-06 (×4): 2 g via INTRAVENOUS
  Filled 2015-12-03 (×5): qty 2

## 2015-12-03 NOTE — Progress Notes (Signed)
Patient states she's feeling much better Awake and alert Moving extremities well Incision looks good Continue drain and antibiotics

## 2015-12-03 NOTE — Consult Note (Signed)
Date of Admission:  12/02/2015  Date of Consult:  12/03/2015  Reason for Consult: Deep postoperative infection in patient sp lumbar surgery Referring Physician: Dr. Kathyrn Sheriff   HPI: [name] is an 70 y.o. female who underwent L3-S1 fusion on 10/2. She was discharged to SNF, and has been home for a few days. She was seen this past Tuesday, 4 days ago, with low-grade fevers and no significant drainage. Unfortunately, over the last 24 hours she has been having fever > 101.5, developed severe HA this morning, and has had yellow-brown drainage from her wound. When she was admitted there was concern for sepsis and leg cultures were drawn and she was started on vancomycin and Zosyn and she was taken the operating room where she underwent debridement and cultures of been taken which now are growing moderate Escherichia coli. I've now narrowed her to ceftriaxone.     Past Medical History:  Diagnosis Date  . Arthritis   . Chronic back pain    stenosis.degenerative disc,some scoliosis  . Constipation    takes Stool Softener daily  . Coronary artery disease   . Depression    takes Cymbalta daily  . Diabetes mellitus    Type 2 diabetic. Average fasting blood sugar runs high 170-200  . Diverticulosis   . GERD (gastroesophageal reflux disease)    takes Nexium daily  . Hemorrhoids   . History of colon polyps    benign  . History of gout    doesn't take any meds  . History of vertigo    doesn't take any meds  . Hyperlipidemia    takes Praluent daily  . Hypertension    takes Imdur,Lisinopril,and Metoprolol daily  . Hypothyroidism    takes Synthroid daily  . Insomnia    takes Restoril nightly  . Joint pain   . Joint swelling   . Muscle spasm    takes Robaxin as needed  . OSA on CPAP   . Peripheral edema    takes LAsix as needed  . Restless leg    takes Requip daily  . Rosacea   . Sleep apnea   . Varicose veins   . Weakness    numbness and tingling.    Past  Surgical History:  Procedure Laterality Date  . ABDOMINAL HYSTERECTOMY     with BSo  . CARDIAC CATHETERIZATION  2013   Normal  . CARPAL TUNNEL RELEASE Bilateral   . CATARACT EXTRACTION, BILATERAL    . CHOLECYSTECTOMY    . COLONOSCOPY    . DIAGNOSTIC LAPAROSCOPY     multiple times  . DILATION AND CURETTAGE OF UTERUS    . ESOPHAGOGASTRODUODENOSCOPY (EGD) WITH PROPOFOL N/A 11/01/2014   Procedure: ESOPHAGOGASTRODUODENOSCOPY (EGD) WITH PROPOFOL;  Surgeon: Hulen Luster, MD;  Location[facility] ENDOSCOPY;  Service: Gastroenterology;  Laterality: N/A;  . IMPLANTABLE CONTACT LENS IMPLANTATION     bilateral  . LAMINECTOMY  11/13/2015  . LUMBAR WOUND DEBRIDEMENT N/A 12/02/2015   Procedure: WOUND Exploration;  Surgeon: Consuella Lose, MD;  Location: [place];  Service: Neurosurgery;  Laterality: N/A;  . ROTATOR CUFF REPAIR Left   . SAVORY DILATION N/A 11/01/2014   Procedure: SAVORY DILATION;  Surgeon: Hulen Luster, MD;  Location: [facility] ENDOSCOPY;  Service: Gastroenterology;  Laterality: N/A;  . TONSILLECTOMY    . TRIGGER FINGER RELEASE Bilateral     Social History:  reports that she has never smoked. She has never used smokeless tobacco. She reports that she does  not drink alcohol or use drugs.   Family History  Problem Relation Age of Onset  . Heart disease Mother   . Breast cancer Mother 49  . Heart disease Father   . Heart attack Sister   . Heart disease Sister   . Heart disease Brother     Allergies  Allergen Reactions  . Ceftin [Cefuroxime Axetil] Diarrhea    diarrhea  . Codeine Rash       . Penicillins Rash    Has patient had a PCN reaction causing immediate rash, facial/tongue/throat swelling, SOB or lightheadedness with hypotension: YES Has patient had a PCN reaction causing severe rash involving mucus membranes or skin necrosis: NO Has patient had a PCN retioion that required hospitalization NO Has patient had a PCN reaction occurring within the last 10 years: YES If all of the  above answers are "NO", then may proceed with Cephalosporin use.  . Vicodin [Hydrocodone-Acetaminophen] Rash     Also passes out     Medications: I have reviewed patients current medications as documented in Epic Anti-infectives    Start     Dose/Rate Route Frequency Ordered Stop   12/03/15 2000  cefTRIAXone (ROCEPHIN) 2 g in dextrose 5 % 50 mL IVPB     2 g 100 mL/hr over 30 Minutes Intravenous Every 24 hours 12/03/15 1358     12/03/15 0500  vancomycin (VANCOCIN) IVPB 750 mg/150 ml premix  Status:  Discontinued     750 mg 150 mL/hr over 60 Minutes Intravenous Every 12 hours 12/02/15 2020 12/03/15 1357   12/02/15 2100  ceFEPIme (MAXIPIME) 2 g in dextrose 5 % 50 mL IVPB  Status:  Discontinued     2 g 100 mL/hr over 30 Minutes Intravenous Every 12 hours 12/02/15 2020 12/03/15 1358   12/02/15 1723  bacitracin 50,000 Units in sodium chloride irrigation 0.9 % 500 mL irrigation  Status:  Discontinued       As needed 12/02/15 1724 12/02/15 1750         ROS:  as in HPI otherwise remainder of 12 point Review of Systems is negative    Blood pressure 135/69, pulse 99, temperature 98.4 F (36.9 C), temperature source Axillary, resp. rate 20, height '5\' 3"'  (1.6 m), weight 225 lb 5 oz (102.2 kg), SpO2 93 %. General: Alert and awake, oriented x3, not in any acute distress tired appearing HEENT: anicteric sclera,  EOMI, oropharynx clear and without exudate Cardiovascular: regular rate, normal r,  no murmur rubs or gallops Pulmonary: clear to auscultation bilaterally, no wheezing, rales or rhonchi Gastrointestinal: soft nontender, nondistended, normal bowel sounds, Musculoskeletal/skin: Dressing in place on back  Neuro: nonfocal  Results for orders placed or performed during the hospital encounter of 12/02/15 (from the past 48 hour(s))  Glucose, capillary     Status: Abnormal   Collection Time: 12/02/15  3:49 PM  Result Value Ref Range   Glucose-Capillary 192 (H) 65 - 99 mg/dL  Aerobic  Culture (superficial specimen)     Status: None (Preliminary result)   Collection Time: 12/02/15  4:35 PM  Result Value Ref Range   Specimen Description WOUND BACK    Special Requests LUMBAR SPEC A POF KEFLEX    Gram Stain      MODERATE WBC PRESENT,BOTH PMN AND MONONUCLEAR FEW GRAM NEGATIVE RODS    Culture MODERATE ESCHERICHIA COLI    Report Status PENDING   Aerobic/Anaerobic Culture (surgical/deep wound)     Status: None (Preliminary result)   Collection Time: 12/02/15  4:40 PM  Result Value Ref Range   Specimen Description WOUND BACK    Special Requests LUMBAR SPEC B POF KEFLEX    Gram Stain      FEW WBC PRESENT,BOTH PMN AND MONONUCLEAR RARE GRAM NEGATIVE RODS    Culture MODERATE ESCHERICHIA COLI    Report Status PENDING   Glucose, capillary     Status: Abnormal   Collection Time: 12/02/15  5:55 PM  Result Value Ref Range   Glucose-Capillary 208 (H) 65 - 99 mg/dL   Comment 1 Notify RN   Culture, blood (routine x 2)     Status: None (Preliminary result)   Collection Time: 12/02/15  6:25 PM  Result Value Ref Range   Specimen Description BLOOD LEFT ANTECUBITAL    Special Requests BOTTLES DRAWN AEROBIC AND ANAEROBIC 10CC    Culture NO GROWTH < 24 HOURS    Report Status PENDING   CBC with Differential/Platelet     Status: Abnormal   Collection Time: 12/02/15  7:19 PM  Result Value Ref Range   WBC 9.4 4.0 - 10.5 K/uL   RBC 3.79 (L) 3.87 - 5.11 MIL/uL   Hemoglobin 10.4 (L) 12.0 - 15.0 g/dL   HCT 33.0 (L) 36.0 - 46.0 %   MCV 87.1 78.0 - 100.0 fL   MCH 27.4 26.0 - 34.0 pg   MCHC 31.5 30.0 - 36.0 g/dL   RDW 13.5 11.5 - 15.5 %   Platelets 413 (H) 150 - 400 K/uL   Neutrophils Relative % 72 %   Neutro Abs 6.8 1.7 - 7.7 K/uL   Lymphocytes Relative 18 %   Lymphs Abs 1.7 0.7 - 4.0 K/uL   Monocytes Relative 10 %   Monocytes Absolute 0.9 0.1 - 1.0 K/uL   Eosinophils Relative 0 %   Eosinophils Absolute 0.0 0.0 - 0.7 K/uL   Basophils Relative 0 %   Basophils Absolute 0.0 0.0 -  0.1 K/uL  Comprehensive metabolic panel     Status: Abnormal   Collection Time: 12/02/15  7:19 PM  Result Value Ref Range   Sodium 134 (L) 135 - 145 mmol/L   Potassium 3.9 3.5 - 5.1 mmol/L   Chloride 97 (L) 101 - 111 mmol/L   CO2 26 22 - 32 mmol/L   Glucose, Bld 230 (H) 65 - 99 mg/dL   BUN 11 6 - 20 mg/dL   Creatinine, Ser 1.17 (H) 0.44 - 1.00 mg/dL   Calcium 8.7 (L) 8.9 - 10.3 mg/dL   Total Protein 6.8 6.5 - 8.1 g/dL   Albumin 2.5 (L) 3.5 - 5.0 g/dL   AST 14 (L) 15 - 41 U/L   ALT 14 14 - 54 U/L   Alkaline Phosphatase 101 38 - 126 U/L   Total Bilirubin 0.6 0.3 - 1.2 mg/dL   GFR calc non Af Amer 46 (L) >60 mL/min   GFR calc Af Amer 53 (L) >60 mL/min    Comment: (NOTE) The eGFR has been calculated using the CKD EPI equation. This calculation has not been validated in all clinical situations. eGFR's persistently <60 mL/min signify possible Chronic Kidney Disease.    Anion gap 11 5 - 15  Culture, blood (routine x 2)     Status: None (Preliminary result)   Collection Time: 12/02/15  7:36 PM  Result Value Ref Range   Specimen Description BLOOD LEFT HAND    Special Requests BOTTLES DRAWN AEROBIC ONLY 8CC    Culture NO GROWTH < 24 HOURS    Report  Status PENDING   Glucose, capillary     Status: Abnormal   Collection Time: 12/02/15  8:44 PM  Result Value Ref Range   Glucose-Capillary 211 (H) 65 - 99 mg/dL  Glucose, capillary     Status: Abnormal   Collection Time: 12/03/15  6:22 AM  Result Value Ref Range   Glucose-Capillary 217 (H) 65 - 99 mg/dL   Comment 1 Notify RN    Comment 2 Document in Chart   Glucose, capillary     Status: Abnormal   Collection Time: 12/03/15 11:13 AM  Result Value Ref Range   Glucose-Capillary 194 (H) 65 - 99 mg/dL   Comment 1 Notify RN    Comment 2 Document in Chart    '@BRIEFLABTABLE' (sdes,specrequest,cult,reptstatus)   ) Recent Results (from the past 720 hour(s))  Surgical pcr screen     Status: None   Collection Time: 11/04/15 10:19 AM    Result Value Ref Range Status   MRSA, PCR NEGATIVE NEGATIVE Final   Staphylococcus aureus NEGATIVE NEGATIVE Final    Comment:        The Xpert SA Assay (FDA approved for NASAL specimens in patients over 78 years of age), is one component of a comprehensive surveillance program.  Test performance has been validated by [facility] for patients greater than or equal to 42 year old. It is not intended to diagnose infection nor to guide or monitor treatment.   Aerobic Culture (superficial specimen)     Status: None (Preliminary result)   Collection Time: 12/02/15  4:35 PM  Result Value Ref Range Status   Specimen Description WOUND BACK  Final   Special Requests LUMBAR SPEC A POF KEFLEX  Final   Gram Stain   Final    MODERATE WBC PRESENT,BOTH PMN AND MONONUCLEAR FEW GRAM NEGATIVE RODS    Culture MODERATE ESCHERICHIA COLI  Final   Report Status PENDING  Incomplete  Aerobic/Anaerobic Culture (surgical/deep wound)     Status: None (Preliminary result)   Collection Time: 12/02/15  4:40 PM  Result Value Ref Range Status   Specimen Description WOUND BACK  Final   Special Requests LUMBAR SPEC B POF KEFLEX  Final   Gram Stain   Final    FEW WBC PRESENT,BOTH PMN AND MONONUCLEAR RARE GRAM NEGATIVE RODS    Culture MODERATE ESCHERICHIA COLI  Final   Report Status PENDING  Incomplete  Culture, blood (routine x 2)     Status: None (Preliminary result)   Collection Time: 12/02/15  6:25 PM  Result Value Ref Range Status   Specimen Description BLOOD LEFT ANTECUBITAL  Final   Special Requests BOTTLES DRAWN AEROBIC AND ANAEROBIC 10CC  Final   Culture NO GROWTH < 24 HOURS  Final   Report Status PENDING  Incomplete  Culture, blood (routine x 2)     Status: None (Preliminary result)   Collection Time: 12/02/15  7:36 PM  Result Value Ref Range Status   Specimen Description BLOOD LEFT HAND  Final   Special Requests BOTTLES DRAWN AEROBIC ONLY 8CC  Final   Culture NO GROWTH < 24 HOURS  Final    Report Status PENDING  Incomplete     Impression/Recommendation  Active Problems:   Post-traumatic wound infection   Wound infection after surgery   [name] is a 70 y.o. female with who underwent L3-S1 fusion on 10/2 now admitted with deep infection at the operative site. I have not yet seen the operative reports a do not know how far  down her infection extended but I'm going to presume it is a deep infection  #1 Postoperative infection after lumbar surgery: Escherichia coli been isolated and I'm changing her to ceftriaxone 2 g daily. I want to assume this is likely a deep infection and will need 4-6 weeks of IV antibiotics.  I will check a sedimentation rate and C-reactive protein with her morning labs.  #2 septic-like picture when she was admitted watch blood cultures make sure they are not positive prior to considering placement of PICC line or long-term IV  #3 screening screen for HIV and viral hepatitides    12/03/2015, 4:16 PM   Thank you so much for this interesting consult  [place] [facility] [place] [phone] (pager) [phone] (office) 12/03/2015, 4:16 PM  [place] 12/03/2015, 4:16 PM

## 2015-12-03 NOTE — Evaluation (Signed)
Occupational Therapy Evaluation Patient Details Name: [name] MRN: [id number] DOB: 04/23/1945 Today's Date: 12/03/2015    History of Present Illness Pt is a 70 y.o. female who underwent L3-S1 fusion on 11/14/15. Pt presented with fever, severe headache, and yellow-brown drainage from her wound. Pt now s/p I&D on 12/02/15.    Clinical Impression   Pt reports she was attempting to manage ADL independently PTA but was having difficulty. Currently pt requires min assist for functional mobility and min-max assist for ADL. Pt able to verbally recall 3/3 back precautions and maintain throughout session. Currently recommending SNF for follow up but pt may be able to progress to home with [facility] if family able to provide 24/7 supervision. Pt would benefit from continued skilled OT to address established goals.    Follow Up Recommendations  SNF;Supervision/Assistance - 24 hour (May consider home with [place] if pt progressing and 24/7 S)    Equipment Recommendations  None recommended by OT    Recommendations for Other Services PT consult     Precautions / Restrictions Precautions Precautions: Back;Fall Precaution Comments: Pt able to recall back preacutions and maintain throughout session. Restrictions Weight Bearing Restrictions: No      Mobility Bed Mobility               General bed mobility comments: Pt OOB in chair upon arrival.  Transfers Overall transfer level: Needs assistance Equipment used: Rolling walker (2 wheeled) Transfers: Sit to/from Stand Sit to Stand: Mod assist         General transfer comment: Mod assist for sit to stand from chair, min assist from [facility]. Cues for hand placement and technique.    Balance Overall balance assessment: Needs assistance Sitting-balance support: Feet supported;Bilateral upper extremity supported Sitting balance-Leahy Scale: Fair     Standing balance support: Bilateral upper extremity supported;During functional  activity Standing balance-Leahy Scale: Poor Standing balance comment: RW for UE support throughout                            ADL Overall ADL's : Needs assistance/impaired Eating/Feeding: Set up;Sitting   Grooming: Min guard;Sitting   Upper Body Bathing: Minimal assitance;Sitting   Lower Body Bathing: Maximal assistance;Sit to/from stand   Upper Body Dressing : Minimal assistance;Sitting   Lower Body Dressing: Maximal assistance;Sit to/from stand Lower Body Dressing Details (indicate cue type and reason): Pt unable to cross foot over opposite knee. Toilet Transfer: Minimal assistance;Ambulation;BSC;RW   Toileting- Clothing Manipulation and Hygiene: Total assistance;Sit to/from stand Toileting - Clothing Manipulation Details (indicate cue type and reason): for peri care     Functional mobility during ADLs: Minimal assistance;Rolling walker       Vision     Perception     Praxis      Pertinent Vitals/Pain Pain Assessment: 0-10 Pain Score: 7  Pain Location: back Pain Descriptors / Indicators: Aching;Sore Pain Intervention(s): Monitored during session     Hand Dominance     Extremity/Trunk Assessment Upper Extremity Assessment Upper Extremity Assessment: Overall WFL for tasks assessed   Lower Extremity Assessment Lower Extremity Assessment: Defer to PT evaluation   Cervical / Trunk Assessment Cervical / Trunk Assessment: Other exceptions Cervical / Trunk Exceptions: s/p spinal sx   Communication Communication Communication: No difficulties   Cognition Arousal/Alertness: Awake/alert Behavior During Therapy: WFL for tasks assessed/performed (tearful at times) Overall Cognitive Status: Within Functional Limits for tasks assessed  General Comments       Exercises       Shoulder Instructions      Home Living Family/patient expects to be discharged to:: Private residence Living Arrangements: Alone Available Help  at Discharge: Family;Available PRN/intermittently Type of Home: House       Home Layout: One level     Bathroom Shower/Tub: Occupational psychologist: Standard     Home Equipment: Environmental consultant - 2 wheels;Cane - single point;Wheelchair - [employer];Shower seat   Additional Comments: During OT eval; pt reports that she lives alone and her twin sister checks in on her throughout the day. Per infrormation gathered from last hospital admission, pt lives at home with spouse and have 24/7 supervision.      Prior Functioning/Environment Level of Independence: Independent with assistive device(s)        Comments: RW for household mobility. Difficulty managing ADL on her own.        OT Problem List: Decreased strength;Decreased activity tolerance;Impaired balance (sitting and/or standing);Decreased knowledge of use of DME or AE;Obesity;Pain   OT Treatment/Interventions: Self-care/ADL training;DME and/or AE instruction;Energy conservation;Therapeutic activities;Patient/family education;Balance training    OT Goals(Current goals can be found in the care plan section) Acute Rehab OT Goals Patient Stated Goal: get better OT Goal Formulation: With patient Time For Goal Achievement: 12/17/15 Potential to Achieve Goals: Good ADL Goals Pt Will Perform Grooming: with min guard assist;standing Pt Will Perform Lower Body Bathing: with min guard assist;with adaptive equipment;sit to/from stand Pt Will Perform Lower Body Dressing: with min guard assist;with adaptive equipment;sit to/from stand Pt Will Transfer to Toilet: with min guard assist;ambulating;bedside commode Pt Will Perform Toileting - Clothing Manipulation and hygiene: with adaptive equipment;sit to/from stand;with min assist  OT Frequency: Min 2X/week   Barriers to D/C: Decreased caregiver support  pt reports during OT eval that she lives alone       Co-evaluation              End of Session Equipment  Utilized During Treatment: Gait belt;Rolling walker Nurse Communication: Mobility status  Activity Tolerance: Patient tolerated treatment well Patient left: in chair;with call bell/phone within reach;with chair alarm set   Time: [phone] OT Time Calculation (min): 24 min Charges:  OT General Charges $OT Visit: 1 Procedure OT Evaluation $OT Eval Moderate Complexity: 1 Procedure OT Treatments $Self Care/Home Management : 8-22 mins G-Codes: OT G-codes **NOT FOR INPATIENT CLASS** Functional Assessment Tool Used: Clinical judgement Functional Limitation: Self care Self Care Current Status [id number]): At least 40 percent but less than 60 percent impaired, limited or restricted Self Care Goal Status [id number]): At least 20 percent but less than 40 percent impaired, limited or restricted    Binnie Kand M.S., OTR/L Pager: [phone]  12/03/2015, 10:11 AM

## 2015-12-03 NOTE — Evaluation (Signed)
Physical Therapy Evaluation Patient Details Name: [name] MRN: [id number] DOB: 01-14-1946 Today's Date: 12/03/2015   History of Present Illness  Pt is a 70 y.o. female who underwent L3-S1 fusion on 11/14/15. Pt presented with fever, severe headache, and yellow-brown drainage from her wound. Pt now s/p I&D on 12/02/15.   Clinical Impression  Patient seen for mobility assessment and education. Patient demonstrates deficits in functional mobility as indicated below. Will need continued skilled PT to address deficits and maximize function. Will see as indicated and progress as tolerated.  OF NOTE: patient extremely lethargic during session, asleep on phone with husband, difficulty maintaining arousal during session.    Follow Up Recommendations SNF (if progresses or has 24/7 assist may consider HHPT)    Equipment Recommendations  None recommended by PT    Recommendations for Other Services       Precautions / Restrictions Precautions Precautions: Back;Fall Precaution Comments: Pt able to recall back preacutions and maintain throughout session. Restrictions Weight Bearing Restrictions: No      Mobility  Bed Mobility Overal bed mobility: Needs Assistance Bed Mobility: Rolling;Sit to Sidelying Rolling: Min assist       Sit to sidelying: Min assist General bed mobility comments: VCs for technique, assist to elevate LE back to bed and reposition with rolling in bed  Transfers Overall transfer level: Needs assistance Equipment used: Rolling walker (2 wheeled) Transfers: Sit to/from Stand Sit to Stand: Min assist         General transfer comment: Mod assist for sit to stand from chair, min assist from [facility]. Cues for hand placement and technique.  Ambulation/Gait Ambulation/Gait assistance: Min assist Ambulation Distance (Feet): 90 Feet Assistive device: Rolling walker (2 wheeled) Gait Pattern/deviations: Step-through pattern;Decreased stride length Gait velocity:  decreased Gait velocity interpretation: Below normal speed for age/gender General Gait Details: slow guarded gait, MAX cues for cadence and stride, patient lethargic during mobility, manual assist for pacing and arousal  Stairs            Wheelchair Mobility    Modified Rankin (Stroke Patients Only)       Balance Overall balance assessment: Needs assistance Sitting-balance support: Feet supported Sitting balance-Leahy Scale: Fair     Standing balance support: Bilateral upper extremity supported Standing balance-Leahy Scale: Poor Standing balance comment: heavy reliance on UE support, posterior instability noted                             Pertinent Vitals/Pain Pain Assessment: Faces Pain Score: 7  Faces Pain Scale: Hurts little more Pain Location: back Pain Descriptors / Indicators: Aching Pain Intervention(s): Monitored during session    Home Living Family/patient expects to be discharged to:: Private residence Living Arrangements: Spouse/significant other Available Help at Discharge: Family;Available PRN/intermittently Type of Home: House       Home Layout: One level Home Equipment: [place] - 2 wheels;Cane - single point;Wheelchair - [employer];Shower seat Additional Comments: During OT eval; pt reports that she lives alone and her twin sister checks in on her throughout the day. Per infrormation gathered from last hospital admission, pt lives at home with spouse and have 24/7 supervision.    Prior Function Level of Independence: Independent with assistive device(s)         Comments: RW for household mobility. Difficulty managing ADL on her own.     Hand Dominance        Extremity/Trunk Assessment   Upper Extremity Assessment: Overall  WFL for tasks assessed           Lower Extremity Assessment: Overall WFL for tasks assessed      Cervical / Trunk Assessment: Other exceptions  Communication   Communication: No  difficulties  Cognition Arousal/Alertness: Lethargic Behavior During Therapy: WFL for tasks assessed/performed Overall Cognitive Status: No family/caregiver present to determine baseline cognitive functioning                      General Comments      Exercises     Assessment/Plan    PT Assessment Patient needs continued PT services  PT Problem List Decreased strength;Decreased activity tolerance;Decreased balance;Decreased mobility;Decreased knowledge of use of DME;Pain          PT Treatment Interventions DME instruction;Gait training;Functional mobility training;Therapeutic activities;Neuromuscular re-education;Patient/family education;Stair training    PT Goals (Current goals can be found in the Care Plan section)  Acute Rehab PT Goals Patient Stated Goal: get better PT Goal Formulation: With patient Time For Goal Achievement: 12/17/15 Potential to Achieve Goals: Good    Frequency Min 5X/week   Barriers to discharge        Co-evaluation               End of Session Equipment Utilized During Treatment: Back brace;Gait belt Activity Tolerance: Patient tolerated treatment well Patient left: in bed;with call bell/phone within reach;with family/visitor present Nurse Communication: Mobility status    Functional Assessment Tool Used: clinical judgement Functional Limitation: Mobility: Walking and moving around Mobility: Walking and Moving Around Current Status [id number]): At least 20 percent but less than 40 percent impaired, limited or restricted Mobility: Walking and Moving Around Goal Status [phone]): At least 1 percent but less than 20 percent impaired, limited or restricted    Time: 1020-1041 PT Time Calculation (min) (ACUTE ONLY): 21 min   Charges:   PT Evaluation $PT Eval Moderate Complexity: 1 Procedure     PT G Codes:   PT G-Codes **NOT FOR INPATIENT CLASS** Functional Assessment Tool Used: clinical judgement Functional Limitation: Mobility:  Walking and moving around Mobility: Walking and Moving Around Current Status [id number]): At least 20 percent but less than 40 percent impaired, limited or restricted Mobility: Walking and Moving Around Goal Status [phone]): At least 1 percent but less than 20 percent impaired, limited or restricted    Duncan Dull 12/03/2015, 11:09 AM Alben Deeds, PT DPT  [phone]

## 2015-12-04 DIAGNOSIS — L02212 Cutaneous abscess of back [any part, except buttock]: Secondary | ICD-10-CM

## 2015-12-04 LAB — GLUCOSE, CAPILLARY
GLUCOSE-CAPILLARY: 164 mg/dL — AB (ref 65–99)
GLUCOSE-CAPILLARY: 121 mg/dL — AB (ref 65–99)
Glucose-Capillary: 147 mg/dL — ABNORMAL HIGH (ref 65–99)
Glucose-Capillary: 172 mg/dL — ABNORMAL HIGH (ref 65–99)

## 2015-12-04 LAB — SEDIMENTATION RATE: SED RATE: 130 mm/h — AB (ref 0–22)

## 2015-12-04 LAB — URINE CULTURE: Culture: <10000 — AB

## 2015-12-04 LAB — HIV ANTIBODY (ROUTINE TESTING W REFLEX): HIV Screen 4th Generation wRfx: NONREACTIVE

## 2015-12-04 LAB — C-REACTIVE PROTEIN: CRP: 30.7 mg/dL — AB (ref <1.0)

## 2015-12-04 NOTE — Progress Notes (Signed)
Patient reported nasal bleeding.  Patient had no active bleeding from nose.  Tissue in hand with pea size amount of clear mucous and drops of blood mixed in.  Patient stated she was blowing her nose.  Denies pain or discomfort.  Will continue to monitor.

## 2015-12-04 NOTE — Progress Notes (Signed)
Patient ID: [name], female   DOB: 11/01/1945, 70 y.o.   MRN: [id number] BP (!) 112/48 (BP Location: Right Arm)   Pulse 77   Temp 98.3 F (36.8 C) (Oral)   Resp 18   Ht 5\' 3"  (1.6 m)   Wt 102.2 kg (225 lb 5 oz)   SpO2 92%   BMI 39.91 kg/m  Alert and oriented x 4, speech is clear and fluent Moving all extremities well Wound drains in place Wound looks good today Continue IV antibiotics

## 2015-12-04 NOTE — Progress Notes (Signed)
Physical Therapy Treatment Patient Details Name: [name] MRN: [id number] DOB: [birth year] Today's Date: 12/04/2015    History of Present Illness Pt is a 70 y.o. female who underwent L3-S1 fusion on 11/14/15. Pt presented with fever, severe headache, and yellow-brown drainage from her wound. Pt now s/p I&D on 12/02/15.     PT Comments    Pt much more alert today throughout session, ambulated 180' with RW and min-guard A. Still requiring min A for transfers. She states that husband has off work next week and can be with her. If she continues to progress as today, expect she could go home with HHPT and RN to manage IV meds. PT will continue to follow.   Follow Up Recommendations  Home health PT (husband has next week off)     Equipment Recommendations  None recommended by PT    Recommendations for Other Services       Precautions / Restrictions Precautions Precautions: Back;Fall Precaution Booklet Issued: No Precaution Comments: verbally reviewed Required Braces or Orthoses:  (Dr Christella Noa told her she does not need to wear) Restrictions Weight Bearing Restrictions: No    Mobility  Bed Mobility               General bed mobility comments: pt received in chair  Transfers Overall transfer level: Needs assistance Equipment used: Rolling walker (2 wheeled) Transfers: Sit to/from Stand Sit to Stand: Min assist         General transfer comment: min A last 10% of stand from chair, specifically needs support during transition of hands from chair to RW. Placed gait belt high, up under arms, away from incision which she appreciated  Ambulation/Gait Ambulation/Gait assistance: Min guard Ambulation Distance (Feet): 180 Feet Assistive device: Rolling walker (2 wheeled) Gait Pattern/deviations: Step-through pattern;Decreased stride length Gait velocity: decreased Gait velocity interpretation: <1.8 ft/sec, indicative of risk for recurrent falls General Gait Details:  guarded gait, able to increase pace slightly with vc's, at first needed cueing for guidance of RW but improved with time and slightly faster pace   Stairs            Wheelchair Mobility    Modified Rankin (Stroke Patients Only)       Balance Overall balance assessment: Needs assistance Sitting-balance support: Feet supported Sitting balance-Leahy Scale: Good     Standing balance support: Bilateral upper extremity supported Standing balance-Leahy Scale: Poor Standing balance comment: unable to stand without UE support                    Cognition Arousal/Alertness: Awake/alert Behavior During Therapy: WFL for tasks assessed/performed Overall Cognitive Status: Within Functional Limits for tasks assessed                      Exercises      General Comments General comments (skin integrity, edema, etc.): pt tearful when speaking of her dogs and the fear that she won't be able to care for them      Pertinent Vitals/Pain Pain Assessment: 0-10 Pain Score: 3  Pain Location: back, hypersensitive to touch when donning brace Pain Descriptors / Indicators: Tender;Sore Pain Intervention(s): Limited activity within patient's tolerance;Monitored during session    Home Living                      Prior Function            PT Goals (current goals can now be found in the care  plan section) Acute Rehab PT Goals Patient Stated Goal: get better PT Goal Formulation: With patient Time For Goal Achievement: 12/17/15 Potential to Achieve Goals: Good Progress towards PT goals: Progressing toward goals    Frequency    Min 5X/week      PT Plan Discharge plan needs to be updated    Co-evaluation             End of Session Equipment Utilized During Treatment: Back brace;Gait belt Activity Tolerance: Patient tolerated treatment well Patient left: in chair;with chair alarm set;with call bell/phone within reach     Time: 0915-0959 PT Time  Calculation (min) (ACUTE ONLY): 44 min  Charges:  $Gait Training: 23-37 mins $Therapeutic Activity: 8-22 mins                    G Codes:     Leighton Roach, PT  Acute Rehab Services  [phone]  Leighton Roach 12/04/2015, 10:59 AM

## 2015-12-04 NOTE — Progress Notes (Signed)
Subjective: No new complaints working with PT in hallway   Antibiotics:  Anti-infectives    Start     Dose/Rate Route Frequency Ordered Stop   12/03/15 2000  cefTRIAXone (ROCEPHIN) 2 g in dextrose 5 % 50 mL IVPB     2 g 100 mL/hr over 30 Minutes Intravenous Every 24 hours 12/03/15 1358     12/03/15 0500  vancomycin (VANCOCIN) IVPB 750 mg/150 ml premix  Status:  Discontinued     750 mg 150 mL/hr over 60 Minutes Intravenous Every 12 hours 12/02/15 2020 12/03/15 1357   12/02/15 2100  ceFEPIme (MAXIPIME) 2 g in dextrose 5 % 50 mL IVPB  Status:  Discontinued     2 g 100 mL/hr over 30 Minutes Intravenous Every 12 hours 12/02/15 2020 12/03/15 1358   12/02/15 1723  bacitracin 50,000 Units in sodium chloride irrigation 0.9 % 500 mL irrigation  Status:  Discontinued       As needed 12/02/15 1724 12/02/15 1750      Medications: Scheduled Meds: . Alirocumab  1 pen Subcutaneous Q14 Days  . cefTRIAXone (ROCEPHIN)  IV  2 g Intravenous Q24H  . cholecalciferol  1,000 Units Oral Daily  . docusate sodium  100 mg Oral BID  . DULoxetine  60 mg Oral Daily  . estrogen-methylTESTOSTERone  1 tablet Oral Daily  . gabapentin  300 mg Oral QID  . hydrOXYzine  50 mg Oral BID  . insulin aspart  0-20 Units Subcutaneous TID WC  . insulin aspart  10 Units Subcutaneous BID WC   And  . insulin aspart  16 Units Subcutaneous Q supper  . insulin glargine  16 Units Subcutaneous QHS  . levothyroxine  75 mcg Oral QAC breakfast  . lisinopril  10 mg Oral BID  . meloxicam  15 mg Oral Daily  . pantoprazole  40 mg Oral Daily  . rOPINIRole  5 mg Oral BID  . senna  1 tablet Oral BID  . sodium chloride flush  3 mL Intravenous Q12H  . temazepam  30 mg Oral QHS  . torsemide  20 mg Oral Q3 days   Continuous Infusions: . sodium chloride 75 mL/hr at 12/04/15 1044  . sodium chloride     PRN Meds:.acetaminophen **OR** acetaminophen, bisacodyl, diazepam, HYDROmorphone (DILAUDID) injection, isosorbide  mononitrate, menthol-cetylpyridinium **OR** phenol, methocarbamol, nitroGLYCERIN, ondansetron (ZOFRAN) IV, oxyCODONE, sodium chloride flush, tapentadol, traMADol    Objective: Weight change:   Intake/Output Summary (Last 24 hours) at 12/04/15 1545 Last data filed at 12/04/15 1300  Gross per 24 hour  Intake           1132.5 ml  Output              115 ml  Net           1017.5 ml   Blood pressure (!) 107/51, pulse 94, temperature 98.6 F (37 C), temperature source Oral, resp. rate 20, height '5\' 3"'  (1.6 m), weight 225 lb 5 oz (102.2 kg), SpO2 100 %. Temp:  [98.1 F (36.7 C)-98.7 F (37.1 C)] 98.6 F (37 C) (10/22 1400) Pulse Rate:  [77-101] 94 (10/22 1400) Resp:  [16-20] 20 (10/22 1400) BP: (94-124)/(45-59) 107/51 (10/22 1400) SpO2:  [92 %-100 %] 100 % (10/22 1400)  Physical Exam: General: Alert and awake, oriented x3, not in any acute distress and walking hallway with PT and wlaker HEENT: anicteric sclera,  EOMI,  Cardiovascular: regular rate, normal r,   Pulmonary:  no  wheezing, resp distress Gastrointestinal: softnondistende Neuro: nonfocal  CBC:  CBC Latest Ref Rng & Units 12/02/2015 11/15/2015 11/14/2015  WBC 4.0 - 10.5 K/uL 9.4 11.2(H) -  Hemoglobin 12.0 - 15.0 g/dL 10.4(L) 11.1(L) 11.9(L)  Hematocrit 36.0 - 46.0 % 33.0(L) 36.5 35.0(L)  Platelets 150 - 400 K/uL 413(H) 195 -     BMET  Recent Labs  12/02/15 1919  NA 134*  K 3.9  CL 97*  CO2 26  GLUCOSE 230*  BUN 11  CREATININE 1.17*  CALCIUM 8.7*     Liver Panel   Recent Labs  12/02/15 1919  PROT 6.8  ALBUMIN 2.5*  AST 14*  ALT 14  ALKPHOS 101  BILITOT 0.6       Sedimentation Rate  Recent Labs  12/04/15 0249  ESRSEDRATE 130*   C-Reactive Protein  Recent Labs  12/04/15 0249  CRP 30.7*    Micro Results: Recent Results (from the past 720 hour(s))  Anaerobic culture     Status: None (Preliminary result)   Collection Time: 12/02/15  4:35 PM  Result Value Ref Range Status    Specimen Description WOUND BACK  Final   Special Requests LUMBAR WOUND SPEC A POF KEFLEX  Final   Culture   Final    NO ANAEROBES ISOLATED; CULTURE IN PROGRESS FOR 5 DAYS   Report Status PENDING  Incomplete  Aerobic Culture (superficial specimen)     Status: None (Preliminary result)   Collection Time: 12/02/15  4:35 PM  Result Value Ref Range Status   Specimen Description WOUND BACK  Final   Special Requests LUMBAR SPEC A POF KEFLEX  Final   Gram Stain   Final    MODERATE WBC PRESENT,BOTH PMN AND MONONUCLEAR FEW GRAM NEGATIVE RODS    Culture MODERATE ESCHERICHIA COLI  Final   Report Status PENDING  Incomplete   Organism ID, Bacteria ESCHERICHIA COLI  Final      Susceptibility   Escherichia coli - MIC*    AMPICILLIN >=32 RESISTANT Resistant     CEFAZOLIN 32 INTERMEDIATE Intermediate     CEFEPIME <=1 SENSITIVE Sensitive     CEFTAZIDIME <=1 SENSITIVE Sensitive     CEFTRIAXONE <=1 SENSITIVE Sensitive     CIPROFLOXACIN <=0.25 SENSITIVE Sensitive     GENTAMICIN <=1 SENSITIVE Sensitive     IMIPENEM <=0.25 SENSITIVE Sensitive     TRIMETH/SULFA <=20 SENSITIVE Sensitive     AMPICILLIN/SULBACTAM 16 INTERMEDIATE Intermediate     PIP/TAZO <=4 SENSITIVE Sensitive     Extended ESBL NEGATIVE Sensitive     * MODERATE ESCHERICHIA COLI  Aerobic/Anaerobic Culture (surgical/deep wound)     Status: None (Preliminary result)   Collection Time: 12/02/15  4:40 PM  Result Value Ref Range Status   Specimen Description WOUND BACK  Final   Special Requests LUMBAR SPEC B POF KEFLEX  Final   Gram Stain   Final    FEW WBC PRESENT,BOTH PMN AND MONONUCLEAR RARE GRAM NEGATIVE RODS    Culture   Final    MODERATE ESCHERICHIA COLI SUSCEPTIBILITIES PERFORMED ON PREVIOUS CULTURE WITHIN THE LAST 5 DAYS. NO ANAEROBES ISOLATED; CULTURE IN PROGRESS FOR 5 DAYS    Report Status PENDING  Incomplete  Culture, blood (routine x 2)     Status: None (Preliminary result)   Collection Time: 12/02/15  6:25 PM  Result  Value Ref Range Status   Specimen Description BLOOD LEFT ANTECUBITAL  Final   Special Requests BOTTLES DRAWN AEROBIC AND ANAEROBIC 10CC  Final   Culture NO  GROWTH 2 DAYS  Final   Report Status PENDING  Incomplete  Culture, blood (routine x 2)     Status: None (Preliminary result)   Collection Time: 12/02/15  7:36 PM  Result Value Ref Range Status   Specimen Description BLOOD LEFT HAND  Final   Special Requests BOTTLES DRAWN AEROBIC ONLY 8CC  Final   Culture NO GROWTH 2 DAYS  Final   Report Status PENDING  Incomplete  Culture, Urine     Status: Abnormal   Collection Time: 12/02/15 11:25 PM  Result Value Ref Range Status   Specimen Description URINE, RANDOM  Final   Special Requests NONE  Final   Culture <10,000 COLONIES/mL INSIGNIFICANT GROWTH (A)  Final   Report Status 12/04/2015 FINAL  Final    Studies/Results: No results found.    Assessment/Plan:  INTERVAL HISTORY:  E coli Sensis are back   Active Problems:   Post-traumatic wound infection   Wound infection after surgery   E coli infection   Abscess of back   Sepsis due to Escherichia coli (E. coli) ([place])    [name] is a 71 y.o. female with  E. Coli infection who underwent L3-S1 fusion on 10/2 now admitted with deep infection at the operative site with E coli isolated on cultures   #1 Postoperative infection after lumbar surgery due to E. Coli  --continue ceftriaxone and treat for 6 weeks postoperatively --she needs to be followed up in ID clinic prior to stopping her antibiotics  Diagnosis:  E coli deep infection after lumbar surgery  Culture Result: E. coli  Allergies  Allergen Reactions  . Ceftin [Cefuroxime Axetil] Diarrhea    diarrhea  . Codeine Rash       . Penicillins Rash    Has patient had a PCN reaction causing immediate rash, facial/tongue/throat swelling, SOB or lightheadedness with hypotension: YES Has patient had a PCN reaction causing severe rash involving mucus membranes or  skin necrosis: NO Has patient had a PCN retioion that required hospitalization NO Has patient had a PCN reaction occurring within the last 10 years: YES If all of the above answers are "NO", then may proceed with Cephalosporin use.  . Vicodin [Hydrocodone-Acetaminophen] Rash     Also passes out    Discharge antibiotics: Ceftriaxone 2 grams daily  Duration: 6 weeks End Date: December 1st, 2017  [facility] Care Per Protocol:  Labs weekly while on IV antibiotics: _x_ CBC with differential _x_ BMP _x_ CRP _x_ ESR   _x_ Please pull PIC at completion of IV antibiotics __ Please leave PIC in place until doctor has seen patient or been notified  Fax weekly labs to [phone]  Clinic Follow Up Appt:   Next 4 weeks  I will sign off  Please call with further questions.      LOS: 1 day   [name] 12/04/2015, 3:45 PM

## 2015-12-05 ENCOUNTER — Encounter (HOSPITAL_COMMUNITY): Payer: Self-pay | Admitting: Neurosurgery

## 2015-12-05 LAB — AEROBIC CULTURE W GRAM STAIN (SUPERFICIAL SPECIMEN)

## 2015-12-05 LAB — AEROBIC CULTURE  (SUPERFICIAL SPECIMEN)

## 2015-12-05 LAB — GLUCOSE, CAPILLARY
GLUCOSE-CAPILLARY: 116 mg/dL — AB (ref 65–99)
GLUCOSE-CAPILLARY: 218 mg/dL — AB (ref 65–99)
GLUCOSE-CAPILLARY: 64 mg/dL — AB (ref 65–99)
GLUCOSE-CAPILLARY: 185 mg/dL — AB (ref 65–99)
Glucose-Capillary: 58 mg/dL — ABNORMAL LOW (ref 65–99)
Glucose-Capillary: 59 mg/dL — ABNORMAL LOW (ref 65–99)
Glucose-Capillary: 63 mg/dL — ABNORMAL LOW (ref 65–99)
Glucose-Capillary: 95 mg/dL (ref 65–99)

## 2015-12-05 LAB — HEPATITIS C ANTIBODY (REFLEX)

## 2015-12-05 LAB — HEPATITIS B SURFACE ANTIGEN: HEP B S AG: NEGATIVE

## 2015-12-05 LAB — HCV COMMENT:

## 2015-12-05 MED ORDER — BISACODYL 10 MG RE SUPP: 10.0000 mg | Freq: Every day | RECTAL | Status: DC | PRN

## 2015-12-05 NOTE — Clinical Social Work Note (Signed)
Clinical Social Work Assessment  Patient Details  Name: [name] MRN: [id number] Date of Birth: 10/24/1945  Date of referral:  12/05/15               Reason for consult:  Facility Placement                Permission sought to share information with:  Family Supports Permission granted to share information::  Yes, Verbal Permission Granted  Name::     [name]  Relationship::  husband  Contact Information:  [phone]  Housing/Transportation Living arrangements for the past 2 months:  Single Family Home Source of Information:  Patient, Spouse Patient Interpreter Needed:  None Criminal Activity/Legal Involvement Pertinent to Current Situation/Hospitalization:  No - Comment as needed Significant Relationships:  Siblings, Spouse Lives with:  Spouse Do you feel safe going back to the place where you live?  Yes Need for family participation in patient care:  No (Coment)  Care giving concerns:  No care giving concerns identified.   Social Worker assessment / plan:  CSW met with pt and husband to address consult for New SNF. CSW introduced herself and explained role of social work. CSW also explained the process of discharging to SNF. Pt was admitted from home. However, pt admitted to a SNF 10/5 and left the next day to return home with home health services. Pt shared that she did not want to return to that particular SNF. Pt may required 6 weeks of IV ABX. Pt is agreeable to discharge to SNF. CSW will initiated SNF search and follow up with bed offers. CSW will continue to follow.   Employment status:  Retired Insurance information:  Medicare PT Recommendations:  Skilled Nursing Facility Information / Referral to community resources:  Skilled Nursing Facility  Patient/Family's Response to care:  Pt and husband were appreciative of CSW support.   Patient/Family's Understanding of and Emotional Response to Diagnosis, Current Treatment, and Prognosis:  Pt understands that she  would benefit from IV ABX at a SNF.  Emotional Assessment Appearance:  Appears stated age Attitude/Demeanor/Rapport:  Crying Affect (typically observed):  Pleasant Orientation:  Oriented to Self, Oriented to Place, Oriented to  Time, Oriented to Situation Alcohol / Substance use:  Never Used Psych involvement (Current and /or in the community):  No (Comment)  Discharge Needs  Concerns to be addressed:  Adjustment to Illness Readmission within the last 30 days:  Yes Current discharge risk:  Other Barriers to Discharge:  Continued Medical Work up    , LCSW 12/05/2015, 8:46 PM  

## 2015-12-05 NOTE — Progress Notes (Signed)
Occupational Therapy Treatment Patient Details Name: [name] MRN: [id number] DOB: 02-04-1946 Today's Date: 12/05/2015    History of present illness Pt is a 70 y.o. female who underwent L3-S1 fusion on 11/14/15. Pt presented with fever, severe headache, and yellow-brown drainage from her wound. Pt now s/p I&D on 12/02/15.    OT comments  Pt demonstrates ability to complete bathroom transfer and sink level grooming. Pt however with poor hand placement on RW and pulling up with RW. Pt cued multiple times during session and self reports "i know I am doing it wrong"  Pt reports having twin sister and spouse to help upon d/c home. Pt with previous SNF stay at [facility]) and had horrible experience per patient.    Follow Up Recommendations  Home health OT    Equipment Recommendations  None recommended by OT    Recommendations for Other Services      Precautions / Restrictions Precautions Precautions: Back;Fall Precaution Booklet Issued: No Precaution Comments: pt able to recall all precautions this session. pt however required cues for bending at sink level task       Mobility Bed Mobility Overal bed mobility: Needs Assistance Bed Mobility: Supine to Sit     Supine to sit: Supervision;HOB elevated     General bed mobility comments: pt with HOB > 30 degrees and use of R bed rail exiting on R side. pt will need HOB decr to prepare for home. pt motivated to return home  Transfers Overall transfer level: Needs assistance Equipment used: Rolling walker (2 wheeled) Transfers: Sit to/from Stand Sit to Stand: Min guard         General transfer comment: pt pulling up on RW with cues to place hand on solid surface. pt states "i know I am doing it wrong but this is how i do it" pt cued x3 during session and continues to pull on RW    Balance Overall balance assessment: Needs assistance Sitting-balance support: Bilateral upper extremity supported;Feet  supported Sitting balance-Leahy Scale: Good     Standing balance support: No upper extremity supported;During functional activity Standing balance-Leahy Scale: Fair                     ADL Overall ADL's : Needs assistance/impaired     Grooming: Wash/dry hands;Wash/dry face;Oral care;Applying deodorant;Min guard;Standing Grooming Details (indicate cue type and reason): sink level bathing UB, oral care using cup method and change of gown Upper Body Bathing: Min guard;Standing               Toilet Transfer: Min guard   Toileting- Clothing Manipulation and Hygiene: Min guard Toileting - Clothing Manipulation Details (indicate cue type and reason): able to squat and perform peri care     Functional mobility during ADLs: Min guard General ADL Comments: Pt demonstrates bed level transfer but with HOB elevated. pt will need to attempt this task with decr HOB. Pt reports twin sister and spouse will (A) upon d/c home      Vision                 Additional Comments: wears glasses at all times   Perception     Praxis      Cognition   Behavior During Therapy: [facility] for tasks assessed/performed Overall Cognitive Status: Within Functional Limits for tasks assessed                       Extremity/Trunk  Assessment               Exercises     Shoulder Instructions       General Comments      Pertinent Vitals/ Pain       Pain Assessment: 0-10 Pain Score: 2  Pain Location: back Pain Descriptors / Indicators: Operative site guarding Pain Intervention(s): Monitored during session;Premedicated before session;Repositioned  Home Living                                          Prior Functioning/Environment              Frequency  Min 2X/week        Progress Toward Goals  OT Goals(current goals can now be found in the care plan section)  Progress towards OT goals: Progressing toward goals  Acute Rehab OT  Goals Patient Stated Goal: get better OT Goal Formulation: With patient Time For Goal Achievement: 12/17/15 Potential to Achieve Goals: Good ADL Goals Pt Will Perform Grooming: with min guard assist;standing Pt Will Perform Lower Body Bathing: with min guard assist;with adaptive equipment;sit to/from stand Pt Will Perform Lower Body Dressing: with min guard assist;with adaptive equipment;sit to/from stand Pt Will Transfer to Toilet: with min guard assist;ambulating;bedside commode Pt Will Perform Toileting - Clothing Manipulation and hygiene: with adaptive equipment;sit to/from stand;with min assist  Plan Discharge plan remains appropriate    Co-evaluation                 End of Session Equipment Utilized During Treatment: Gait belt;Rolling walker   Activity Tolerance Patient tolerated treatment well   Patient Left in chair;with call bell/phone within reach;with chair alarm set   Nurse Communication Mobility status;Precautions        Time: [id number] OT Time Calculation (min): 29 min  Charges: OT General Charges $OT Visit: 1 Procedure OT Treatments $Self Care/Home Management : 23-37 mins  Parke Poisson B 12/05/2015, 9:57 AM   Jeri Modena   OTR/L Pager: [phone] Office: [phone] .

## 2015-12-05 NOTE — Progress Notes (Signed)
Hemo vacs (2) d/c'd per order

## 2015-12-05 NOTE — Progress Notes (Signed)
Physical Therapy Treatment Patient Details Name: [name] MRN: [id number] DOB: 01-11-1946 Today's Date: 12/05/2015    History of Present Illness Pt is a 70 y.o. female who underwent L3-S1 fusion on 11/14/15. Pt presented with fever, severe headache, and yellow-brown drainage from her wound. Pt now s/p I&D on 12/02/15.     PT Comments    Discussed d/c plan with pt and family who were present during session. Pt's husband travels for work and sister is reportedly in poor health and unable to assist her at home if needed. Pt feels more comfortable planning for continued therapy at d/c before being alone at home. Feel this is reasonable as pt continues to fatigue quickly and require hands-on guarding for safety during functional mobility.   Follow Up Recommendations  SNF     Equipment Recommendations  None recommended by PT    Recommendations for Other Services       Precautions / Restrictions Precautions Precautions: Back;Fall Precaution Booklet Issued: No Precaution Comments: Pt able to recall 3/3 back precautions at beginning of session. Generally good awareness of precautions during mobility Required Braces or Orthoses: Spinal Brace (Per pt, Dr said she did not have to wear brace. ) Restrictions Weight Bearing Restrictions: No    Mobility  Bed Mobility Overal bed mobility: Needs Assistance Bed Mobility: Supine to Sit     Supine to sit: Supervision;HOB elevated     General bed mobility comments: Pt sitting up in chair upon PT arrival.   Transfers Overall transfer level: Needs assistance Equipment used: Rolling walker (2 wheeled) Transfers: Sit to/from Stand Sit to Stand: Min guard         General transfer comment: VC's for hand placement on seated surface for safety. hands on guarding for support as pt powered-up to full stand.   Ambulation/Gait Ambulation/Gait assistance: Min guard Ambulation Distance (Feet): 175 Feet Assistive device: Rolling walker (2  wheeled) Gait Pattern/deviations: Step-through pattern;Decreased stride length;Trunk flexed Gait velocity: Decreased Gait velocity interpretation: Below normal speed for age/gender General Gait Details: VC's for improved posture and general safety with RW. Pt was moving very slow and guarded and fatigued quickly.    Stairs            Wheelchair Mobility    Modified Rankin (Stroke Patients Only)       Balance Overall balance assessment: Needs assistance Sitting-balance support: Feet supported;No upper extremity supported Sitting balance-Leahy Scale: Good     Standing balance support: No upper extremity supported Standing balance-Leahy Scale: Fair Standing balance comment: Statically                    Cognition Arousal/Alertness: Awake/alert Behavior During Therapy: WFL for tasks assessed/performed Overall Cognitive Status: Within Functional Limits for tasks assessed                      Exercises      General Comments        Pertinent Vitals/Pain Pain Assessment: 0-10 Pain Score: 3  Pain Location: Back Pain Descriptors / Indicators: Operative site guarding Pain Intervention(s): Limited activity within patient's tolerance;Monitored during session;Repositioned    Home Living                      Prior Function            PT Goals (current goals can now be found in the care plan section) Acute Rehab PT Goals Patient Stated Goal: get better PT Goal  Formulation: With patient Time For Goal Achievement: 12/17/15 Potential to Achieve Goals: Good Progress towards PT goals: Progressing toward goals    Frequency    Min 5X/week      PT Plan Discharge plan needs to be updated    Co-evaluation             End of Session Equipment Utilized During Treatment: Gait belt Activity Tolerance: Patient tolerated treatment well Patient left: in chair;with chair alarm set;with call bell/phone within reach     Time: [id number] PT  Time Calculation (min) (ACUTE ONLY): 28 min  Charges:  $Gait Training: 8-22 mins $Therapeutic Activity: 8-22 mins                    G Codes:      Rolinda Roan Dec 11, 2015, 12:03 PM   Rolinda Roan, PT, DPT Acute Rehabilitation Services Pager: [phone]

## 2015-12-05 NOTE — Care Management Note (Signed)
Case Management Note  Patient Details  Name: [name] MRN: [id number] Date of Birth: 1945-12-04  Subjective/Objective:                    Action/Plan: CM and CSW met with the patient and her husband. Per ID note patient is going to need 6 weeks of IV antibiotics. Pt and her husband do not feel they can handle this at home and are requesting the patient go to rehab for the IV therapy. CSW provided them with a list of the facilities. Patient is interested in [place]. CM following for d/c needs.   Expected Discharge Date:                  Expected Discharge Plan:  Skilled Nursing Facility  In-House Referral:  Clinical Social Work  Discharge planning Services  CM Consult  Post Acute Care Choice:    Choice offered to:     DME Arranged:    DME Agency:     HH Arranged:    [place] Agency:     Status of Service:  In process, will continue to follow  If discussed at Long Length of Stay Meetings, dates discussed:    Additional Comments:  Pollie Friar, RN 12/05/2015, 4:51 PM

## 2015-12-05 NOTE — Progress Notes (Signed)
Patient ID: [name], female   DOB: 11/07/1945, 70 y.o.   MRN: [id number] Subjective:  the patient is alert and pleasant. She looks well.  Objective: Vital signs in last 24 hours: Temp:  [97.8 F (36.6 C)-98.3 F (36.8 C)] 98.2 F (36.8 C) (10/23 1355) Pulse Rate:  [88-99] 94 (10/23 1355) Resp:  [18-20] 18 (10/23 0500) BP: (100-133)/(49-81) 113/54 (10/23 1355) SpO2:  [93 %-100 %] 96 % (10/23 1355)  Intake/Output from previous day: 10/22 0701 - 10/23 0700 In: 2145 [P.O.:240; I.V.:1875] Out: 140 [Drains:140] Intake/Output this shift: Total I/O In: 240 [P.O.:240] Out: -   Physical exam the patient is alert and oriented. She is moving her lower extremities  Well.  The patient's dressing is clean and  Dry.    Lab Results:  Recent Labs  12/02/15 1919  WBC 9.4  HGB 10.4*  HCT 33.0*  PLT 413*   BMET  Recent Labs  12/02/15 1919  NA 134*  K 3.9  CL 97*  CO2 26  GLUCOSE 230*  BUN 11  CREATININE 1.17*  CALCIUM 8.7*    Studies/Results: No results found.  Assessment/Plan: Wound infection: The cultures have grown Escheric Coli. I'll have a PICC line placed. I have spoken to the patient's husband.  LOS: 2 days     [name] D 12/05/2015, 4:52 PM

## 2015-12-05 NOTE — Progress Notes (Signed)
Hypoglycemic Event  CBG:58   Treatment: 15 GM carbohydrate snack  Symptoms: None  Follow-up CBG: Time:1728/1811 CBG Result:63/95  Possible Reasons for Event: Unknown  Comments/MD notified:Hypoglycemic protocol    Lennox Laity RN

## 2015-12-05 NOTE — Op Note (Signed)
PREOP DIAGNOSIS:  1. Wound infection   POSTOP DIAGNOSIS: Same  PROCEDURE: 1. Wound debridement, washout, and closure  SURGEON: Dr. Consuella Lose, MD  ASSISTANT: Dr. Ashok Pall, MD  ANESTHESIA: General Endotracheal  EBL: 50cc  SPECIMENS: Superficial and deep wound cultures  DRAINS: subfacial hemovac (tunneled to left of wound), subcutaneous hemovac (tunneled to right of wound)  COMPLICATIONS: None immediate  CONDITION: Stable to PACU  HISTORY: [name] is a 70 y.o. female who previously underwent lumbar fusion on 10/2. Over the last few days had had progressive pain, fevers, and drainage from wound. She was seen in the office today and found to be lethargic, febrile, with purulent drainage. She therefore presents for wound washout and closure.  PROCEDURE IN DETAIL: After informed consent was obtained and witnessed, the patient was brought to the operating room. After induction of general anesthesia, the patient was positioned on the operative table in the prone position. All pressure points were meticulously padded. Skin incision was then prepped and draped in the usual sterile fashion.  After timeout was conducted, the previous incision was opened with scissors. A large amount of purulent fluid was immediately encountered. This was cultured. Once the pus was suctioned out, the facial stitches were cut, and again a significant amount of frankly purulent fluid was encountered. This was cultured separately. After all the pus was suctioned, the wound was irrigated with 2L of bacitracin irrigation. Subfacial drain was placed and tunneled to the left of the wound. The facia was then closed with 0 Vicryl. A subcutaneous drain was then placed and tunneled to the right. Subcutaneous layer was closed with 0 Vicryl. The skin edges were then debrided of de-vitalized edges and closed with staples. Sterile dressing was then applied.  The patient was then transferred to the stretcher and  taken to the PACU in stable hemodynamic condition. At the end of the case all sponge, needle, and instrument counts were correct.

## 2015-12-06 ENCOUNTER — Encounter (HOSPITAL_COMMUNITY): Payer: Self-pay | Admitting: Neurosurgery

## 2015-12-06 LAB — GLUCOSE, CAPILLARY
GLUCOSE-CAPILLARY: 255 mg/dL — AB (ref 65–99)
GLUCOSE-CAPILLARY: 118 mg/dL — AB (ref 65–99)
GLUCOSE-CAPILLARY: 158 mg/dL — AB (ref 65–99)
Glucose-Capillary: 180 mg/dL — ABNORMAL HIGH (ref 65–99)
Glucose-Capillary: 69 mg/dL (ref 65–99)

## 2015-12-06 MED ORDER — SODIUM CHLORIDE 0.9% FLUSH
10.0000 mL | INTRAVENOUS | Status: DC | PRN
  Administered 2015-12-07: 10 mL
  Filled 2015-12-06: qty 40

## 2015-12-06 NOTE — Progress Notes (Signed)
Physical Therapy Treatment Patient Details Name: [name] MRN: [id number] DOB: September 19, 1945 Today's Date: 12/06/2015    History of Present Illness Pt is a 70 y.o. female who underwent L3-S1 fusion on 11/14/15. Pt presented with fever, severe headache, and yellow-brown drainage from her wound. Pt now s/p I&D on 12/02/15.     PT Comments    Pt progressing slowly towards physical therapy goals. Pt reports sudden feeling of being hot and required urgent seated rest break. Vitals at that time were as follows: 145/61, 105 HR, 100% O2 sat. RN present during this episode and brought chair for pt. Will continue to follow.   Follow Up Recommendations  SNF     Equipment Recommendations  None recommended by PT    Recommendations for Other Services       Precautions / Restrictions Precautions Precautions: Back;Fall Precaution Booklet Issued: No Precaution Comments: Pt able to recall 3/3 back precautions at beginning of session. Generally good awareness of precautions during mobility Required Braces or Orthoses: Spinal Brace (Per pt, Dr said she did not have to wear brace. ) Restrictions Weight Bearing Restrictions: No    Mobility  Bed Mobility               General bed mobility comments: Pt sitting up in chair upon PT arrival.   Transfers Overall transfer level: Needs assistance Equipment used: Rolling walker (2 wheeled) Transfers: Sit to/from Stand Sit to Stand: Min guard         General transfer comment: VC's for hand placement on seated surface for safety. hands on guarding for support as pt powered-up to full stand.   Ambulation/Gait Ambulation/Gait assistance: Min guard Ambulation Distance (Feet): 75 Feet Assistive device: Rolling walker (2 wheeled) Gait Pattern/deviations: Step-through pattern;Decreased stride length;Trunk flexed Gait velocity: Decreased Gait velocity interpretation: Below normal speed for age/gender General Gait Details: VC's for improved  posture and general safety with RW. Pt was moving very slow and guarded and fatigued quickly. Pt suddenly became hot while ambulating and required a seated rest break. Pt reports she felt like she may pass out. Vitals stable during that time.    Stairs            Wheelchair Mobility    Modified Rankin (Stroke Patients Only)       Balance Overall balance assessment: Needs assistance Sitting-balance support: Feet supported;No upper extremity supported Sitting balance-Leahy Scale: Good     Standing balance support: No upper extremity supported Standing balance-Leahy Scale: Fair Standing balance comment: Static                    Cognition Arousal/Alertness: Awake/alert Behavior During Therapy: WFL for tasks assessed/performed Overall Cognitive Status: Within Functional Limits for tasks assessed                      Exercises      General Comments General comments (skin integrity, edema, etc.): Pt was very happy at beginning of session and suddenly became emotional and tearful after sister called.       Pertinent Vitals/Pain Pain Assessment: Faces Faces Pain Scale: Hurts little more Pain Location: back Pain Descriptors / Indicators: Operative site guarding Pain Intervention(s): Monitored during session;Limited activity within patient's tolerance;Repositioned    Home Living                      Prior Function            PT Goals (current  goals can now be found in the care plan section) Acute Rehab PT Goals Patient Stated Goal: get better PT Goal Formulation: With patient Time For Goal Achievement: 12/17/15 Potential to Achieve Goals: Good Progress towards PT goals: Progressing toward goals    Frequency    Min 5X/week      PT Plan Current plan remains appropriate    Co-evaluation             End of Session Equipment Utilized During Treatment: Gait belt Activity Tolerance: Treatment limited secondary to medical  complications (Comment) (Feeling of near-syncope. ) Patient left: in chair;with chair alarm set;with call bell/phone within reach     Time: 1025-1053 PT Time Calculation (min) (ACUTE ONLY): 28 min  Charges:  $Gait Training: 8-22 mins $Therapeutic Activity: 8-22 mins                    G Codes:      Rolinda Roan Dec 09, 2015, 11:32 AM  Rolinda Roan, PT, DPT Acute Rehabilitation Services Pager: [phone]

## 2015-12-06 NOTE — Progress Notes (Signed)
Peripherally Inserted Central Catheter/Midline Placement  The IV Nurse has discussed with the patient and/or persons authorized to consent for the patient, the purpose of this procedure and the potential benefits and risks involved with this procedure.  The benefits include less needle sticks, lab draws from the catheter, and the patient may be discharged home with the catheter. Risks include, but not limited to, infection, bleeding, blood clot (thrombus formation), and puncture of an artery; nerve damage and irregular heartbeat and possibility to perform a PICC exchange if needed/ordered by physician.  Alternatives to this procedure were also discussed.  Bard Power PICC patient education guide, fact sheet on infection prevention and patient information card has been provided to patient /or left at bedside.    PICC/Midline Placement Documentation        [name] 12/06/2015, 9:10 AM

## 2015-12-06 NOTE — Progress Notes (Signed)
Inpatient Diabetes Program Recommendations  AACE/ADA: New Consensus Statement on Inpatient Glycemic Control (2015)  Target Ranges:  Prepandial:   less than 140 mg/dL      Peak postprandial:   less than 180 mg/dL (1-2 hours)      Critically ill patients:  140 - 180 mg/dL   Lab Results  Component Value Date   GLUCAP 255 (H) 12/06/2015    Review of Glycemic Control Results for DAMINI, COVAULT (MRN [id number]) as of 12/06/2015 11:56  Ref. Range 12/05/2015 18:11 12/05/2015 21:18 12/06/2015 06:06 12/06/2015 11:43  Glucose-Capillary Latest Ref Range: 65 - 99 mg/dL 95 185 (H) 180 (H) 255 (H)   Diabetes history: DM2 Outpatient Diabetes medications: Lantus 32 units daily +Novolog meal coverage 11/22/14 tid Current orders for Inpatient glycemic control: Lantus 16 units + Novolog correction 0-20 units tid  Inpatient Diabetes Program Recommendations:  Noted previous hypoglycemia post meal coverage. Please consider adding meal coverage back @ reduced dose of Novolog 4 units tid if eats 50%.  Thank you, Nani Gasser. Tashawn Greff, RN, MSN, CDE Inpatient Glycemic Control Team Team Pager [phone] (8am-5pm) 12/06/2015 12:04 PM

## 2015-12-06 NOTE — Progress Notes (Signed)
Hypoglycemic Event  CBG: 69  Treatment: 15 GM carbohydrate snack  Symptoms: None  Follow-up CBG: Time:1816 CBG Result:118  Possible Reasons for Event: Unknown  Comments/MD notified:Hypoglycemic protocol.    Tom-Johnson, Renea Ee

## 2015-12-06 NOTE — Progress Notes (Signed)
Patient ID: [name], female   DOB: [birth year], 70 y.o.   MRN: [id number] Subjective:  the patient is alert and pleasant.  Objective: Vital signs in last 24 hours: Temp:  [97.8 F (36.6 C)-99 F (37.2 C)] 99 F (37.2 C) (10/24 0928) Pulse Rate:  [87-97] 96 (10/24 0928) Resp:  [18] 18 (10/24 0928) BP: (111-154)/(44-81) 154/60 (10/24 0928) SpO2:  [95 %-100 %] 98 % (10/24 0928)  Intake/Output from previous day: 10/23 0701 - 10/24 0700 In: 240 [P.O.:240] Out: -  Intake/Output this shift: No intake/output data recorded.  Physical exam the patient is alert and oriented.  She is moving her lower extremities well.  Lab Results: No results for input(s): WBC, HGB, HCT, PLT in the last 72 hours. BMET No results for input(s): NA, K, CL, CO2, GLUCOSE, BUN, CREATININE, CALCIUM in the last 72 hours.  Studies/Results: No results found.  Assessment/Plan: Wound infection: The patient is going to have her PICC line placed today.  Arrangements are being made to be transferred to [facility] rehabilitation for rehabilitation and to complete her 6 week course of antibiotics.  LOS: 3 days     Oluwaseun Cremer D 12/06/2015, 9:49 AM

## 2015-12-06 NOTE — Addendum Note (Signed)
Addendum  created 12/06/15 1128 by Oleta Mouse, MD   Anesthesia Event edited, Anesthesia Staff edited

## 2015-12-06 NOTE — [place] FL2 (Signed)
[place] MEDICAID FL2 LEVEL OF CARE SCREENING TOOL     IDENTIFICATION  Patient Name: [name] Birthdate: 1945-02-17 Sex: female Admission Date (Current Location): 12/02/2015  [facility] and [place] Number:  Engineering geologist and Address:  [facility] [place], [place] [address], [place], [place] [address]      Provider Number: [id number]  Attending Physician Name and Address:  Newman Pies, MD  Relative Name and Phone Number:  Darlis Loan [facility]) - [phone]    Current Level of Care: Hospital Recommended Level of Care: [place] Prior Approval Number:    Date Approved/Denied:   PASRR Number: [id number]  Discharge Plan: SNF    Current Diagnoses: Patient Active Problem List   Diagnosis Date Noted  . E coli infection   . Abscess of back   . Sepsis due to Escherichia coli (E. coli) ([place])   . 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 Wound infection after surgery 12/02/2015  . Lumbar degenerative disc disease 11/14/2015  . Itching 02/24/2014  . Anxiety 02/24/2014  . Fatigue 10/23/2013  . Chest pain 06/09/2012  . CAD (coronary artery disease) 06/09/2012  . Hyperlipidemia 08/09/2011  . Arm pain 06/28/2011  . Tachycardia 05/05/2010  . Diabetes mellitus ([place]) 05/05/2010  . OSA on CPAP 05/05/2010  . HTN (hypertension) 05/05/2010  . Edema 05/05/2010  . HYPERTRIGLYCERIDEMIA 06/08/2008  . DYSPNEA 06/08/2008    Orientation RESPIRATION BLADDER Height & Weight     Self, Time, Situation, Place  Normal Continent Weight: 225 lb 5 oz (102.2 kg) Height:  5\' 3"  (160 cm)  BEHAVIORAL SYMPTOMS/MOOD NEUROLOGICAL BOWEL NUTRITION STATUS      Continent Diet (Carb Modified)  AMBULATORY STATUS COMMUNICATION OF NEEDS Skin   Limited Assist Verbally Surgical wounds                       Personal Care Assistance Level of Assistance  Bathing, Feeding, Dressing Bathing Assistance: Limited assistance Feeding assistance:  Independent Dressing Assistance: Limited assistance     Functional Limitations Info  Sight, Hearing, Speech Sight Info: Adequate Hearing Info: Adequate Speech Info: Adequate    SPECIAL CARE FACTORS FREQUENCY  PT (By licensed PT), OT (By licensed OT)     PT Frequency: PT Eval completed on 12/03/2015 - recommendation of minimum of 5x/week OT Frequency: OT Eval completed on 12/03/2015 - recommendation of minimum of 3x/week            Contractures Contractures Info: Not present    Additional Factors Info  Code Status, Allergies, Insulin Sliding Scale Code Status Info: Full Code Allergies Info: Ceftin Cefuroxime Axetil, Codeine, Penicillins, Vicodin Hydrocodone-acetaminophen   Insulin Sliding Scale Info: insulin aspart (novoLOG) injection 0-20 Units 0-20 Units, Subcutaneous, 3 times daily with meals         Current Medications (12/06/2015):  This is the current hospital active medication list Current Facility-Administered Medications  Medication Dose Route Frequency Provider Last Rate Last Dose  . 0.9 %  sodium chloride infusion   Intravenous Continuous Consuella Lose, MD 75 mL/hr at 12/04/15 2116    . 0.9 %  sodium chloride infusion  250 mL Intravenous Continuous Consuella Lose, MD      . acetaminophen (TYLENOL) tablet 650 mg  650 mg Oral Q4H PRN Consuella Lose, MD       Or  . acetaminophen (TYLENOL) suppository 650 mg  650 mg Rectal Q4H PRN Consuella Lose, MD      . bisacodyl (DULCOLAX) suppository 10  mg  10 mg Rectal Daily PRN Newman Pies, MD      . cefTRIAXone (ROCEPHIN) 2 g in dextrose 5 % 50 mL IVPB  2 g Intravenous Q24H Truman Hayward, MD   2 g at 12/05/15 2105  . cholecalciferol (VITAMIN D) tablet 1,000 Units  1,000 Units Oral Daily Consuella Lose, MD   1,000 Units at 12/06/15 0940  . diazepam (VALIUM) tablet 5 mg  5 mg Oral Q6H PRN Consuella Lose, MD   5 mg at 12/03/15 1954  . docusate sodium (COLACE) capsule 100 mg  100 mg Oral BID Consuella Lose, MD   100 mg at 12/06/15 0941  . DULoxetine (CYMBALTA) DR capsule 60 mg  60 mg Oral Daily Consuella Lose, MD   60 mg at 12/06/15 0940  . estrogen-methylTESTOSTERone 0.625-1.25 MG per tablet 1 tablet  1 tablet Oral Daily Consuella Lose, MD      . gabapentin (NEURONTIN) capsule 300 mg  300 mg Oral QID Consuella Lose, MD   300 mg at 12/06/15 0941  . HYDROmorphone (DILAUDID) injection 0.5-1 mg  0.5-1 mg Intravenous Q2H PRN Consuella Lose, MD   1 mg at 12/05/15 1218  . hydrOXYzine (ATARAX/VISTARIL) tablet 50 mg  50 mg Oral BID Consuella Lose, MD   50 mg at 12/06/15 0941  . insulin aspart (novoLOG) injection 0-20 Units  0-20 Units Subcutaneous TID WC Consuella Lose, MD   4 Units at 12/06/15 0800  . insulin glargine (LANTUS) injection 16 Units  16 Units Subcutaneous QHS Consuella Lose, MD   16 Units at 12/05/15 2259  . isosorbide mononitrate (IMDUR) 24 hr tablet 30 mg  30 mg Oral PRN Consuella Lose, MD      . levothyroxine (SYNTHROID, LEVOTHROID) tablet 75 mcg  75 mcg Oral QAC breakfast Consuella Lose, MD   75 mcg at 12/06/15 0800  . lisinopril (PRINIVIL,ZESTRIL) tablet 10 mg  10 mg Oral BID Consuella Lose, MD   10 mg at 12/06/15 0940  . meloxicam (MOBIC) tablet 15 mg  15 mg Oral Daily Consuella Lose, MD   15 mg at 12/06/15 0940  . menthol-cetylpyridinium (CEPACOL) lozenge 3 mg  1 lozenge Oral PRN Consuella Lose, MD       Or  . phenol (CHLORASEPTIC) mouth spray 1 spray  1 spray Mouth/Throat PRN Consuella Lose, MD      . methocarbamol (ROBAXIN) tablet 750 mg  750 mg Oral TID PRN Consuella Lose, MD   750 mg at 12/06/15 0941  . nitroGLYCERIN (NITROSTAT) SL tablet 0.4 mg  0.4 mg Sublingual Q5 min PRN Consuella Lose, MD      . ondansetron (ZOFRAN) injection 4 mg  4 mg Intravenous Q4H PRN Consuella Lose, MD      . oxyCODONE (Oxy IR/ROXICODONE) immediate release tablet 15 mg  15 mg Oral Q4H PRN Consuella Lose, MD   15 mg at 12/06/15 0940  .  pantoprazole (PROTONIX) EC tablet 40 mg  40 mg Oral Daily Consuella Lose, MD   40 mg at 12/06/15 0940  . rOPINIRole (REQUIP) tablet 5 mg  5 mg Oral BID Consuella Lose, MD   5 mg at 12/06/15 0940  . senna (SENOKOT) tablet 8.6 mg  1 tablet Oral BID Consuella Lose, MD   8.6 mg at 12/06/15 0940  . sodium chloride flush (NS) 0.9 % injection 10-40 mL  10-40 mL Intracatheter PRN Newman Pies, MD      . sodium chloride flush (NS) 0.9 % injection 3 mL  3 mL  Intravenous Q12H Consuella Lose, MD   3 mL at 12/06/15 0948  . sodium chloride flush (NS) 0.9 % injection 3 mL  3 mL Intravenous PRN Consuella Lose, MD      . tapentadol (NUCYNTA) tablet 50 mg  50 mg Oral TID PRN Consuella Lose, MD      . temazepam (RESTORIL) capsule 30 mg  30 mg Oral QHS Consuella Lose, MD   30 mg at 12/05/15 2104  . torsemide (DEMADEX) tablet 20 mg  20 mg Oral Q3 days Consuella Lose, MD   20 mg at 12/06/15 0945  . traMADol (ULTRAM) tablet 50 mg  50 mg Oral Q6H PRN Consuella Lose, MD   50 mg at 12/04/15 1749     Discharge Medications: Please see discharge summary for a list of discharge medications.  Relevant Imaging Results:  Relevant Lab Results:   Additional Information SSN: [id number]  Lajoyce Lauber Work [phone]

## 2015-12-06 NOTE — Anesthesia Postprocedure Evaluation (Signed)
Anesthesia Post Note  Patient: [name]  Procedure(s) Performed: Procedure(s) (LRB): WOUND Exploration (N/A)  Patient location during evaluation: PACU Anesthesia Type: General Level of consciousness: awake Pain management: pain level controlled Vital Signs Assessment: post-procedure vital signs reviewed and stable Respiratory status: spontaneous breathing Cardiovascular status: stable Postop Assessment: no signs of nausea or vomiting Anesthetic complications: no    Last Vitals:  Vitals:   12/06/15 0124 12/06/15 0609  BP: (!) 121/49 (!) 136/54  Pulse: 94 95  Resp: 18 18  Temp: 36.9 C 36.9 C    Last Pain:  Vitals:   12/06/15 0609  TempSrc: Oral  PainSc:                  Farzana Koci

## 2015-12-07 ENCOUNTER — Encounter
Admission: RE | Admit: 2015-12-07 | Discharge: 2015-12-07 | Disposition: A | Discharge status: Home / Self Care | Payer: Medicare Other | Source: Ambulatory Visit | Attending: Internal Medicine | Admitting: Internal Medicine

## 2015-12-07 LAB — ANAEROBIC CULTURE

## 2015-12-07 LAB — GLUCOSE, CAPILLARY
GLUCOSE-CAPILLARY: 237 mg/dL — AB (ref 65–99)
GLUCOSE-CAPILLARY: 165 mg/dL — AB (ref 65–99)
Glucose-Capillary: 140 mg/dL — ABNORMAL HIGH (ref 65–99)
Glucose-Capillary: 175 mg/dL — ABNORMAL HIGH (ref 65–99)

## 2015-12-07 LAB — AEROBIC/ANAEROBIC CULTURE W GRAM STAIN (SURGICAL/DEEP WOUND)

## 2015-12-07 LAB — CULTURE, BLOOD (ROUTINE X 2)
CULTURE: NO GROWTH
Culture: NO GROWTH

## 2015-12-07 MED ORDER — HEPARIN SOD (PORK) LOCK FLUSH 100 UNIT/ML IV SOLN
250.0000 [IU] | INTRAVENOUS | Status: AC | PRN
  Administered 2015-12-07: 250 [IU]

## 2015-12-07 MED ORDER — DEXTROSE 5 % IV SOLN: 2.0000 g | INTRAVENOUS | 0 refills | Status: AC

## 2015-12-07 NOTE — Clinical Social Work Placement (Addendum)
   CLINICAL SOCIAL WORK PLACEMENT  NOTE 12/07/15- DISCHARGED TO [facility]  Date:  12/07/2015  Patient Details  Name: [name] MRN: [id number] Date of Birth: 02-03-1946  Clinical Social Work is seeking post-discharge placement for this patient at the [place] level of care (*CSW will initial, date and re-position this form in  chart as items are completed):  Yes   Patient/family provided with [place] Work Department's list of facilities offering this level of care within the geographic area requested by the patient (or if unable, by the patient's family).  Yes   Patient/family informed of their freedom to choose among providers that offer the needed level of care, that participate in Medicare, Medicaid or managed care program needed by the patient, have an available bed and are willing to accept the patient.  Yes   Patient/family informed of [facility]'s ownership interest in [facility] and [facility], as well as of the fact that they are under no obligation to receive care at these facilities.  PASRR submitted to EDS on       PASRR number received on       Existing PASRR number confirmed on 12/06/15     FL2 transmitted to all facilities in geographic area requested by pt/family on 12/06/15     FL2 transmitted to all facilities within larger geographic area on       Patient informed that his/her managed care company has contracts with or will negotiate with certain facilities, including the following:        Yes   Patient/family informed of bed offers received.  Patient chooses bed at [facility]     Physician recommends and patient chooses bed at      Patient to be transferred to [facility] on 12/07/15.  Patient to be transferred to facility by ambulance   Patient family notified on 12/07/15 of transfer.  Name of family member notified:  Spouse Sonia Side and Daughter Butch Penny at the bedside     PHYSICIAN        Additional Comment:    _______________________________________________ Sable Feil, LCSW 12/07/2015, 1:33 PM

## 2015-12-07 NOTE — Care Management Note (Signed)
Case Management Note  Patient Details  Name: [name] MRN: [id number] Date of Birth: 1945/04/21  Subjective/Objective:                    Action/Plan: Pt discharging to [facility] today. No further needs per CM.   Expected Discharge Date:                  Expected Discharge Plan:  Skilled Nursing Facility  In-House Referral:  Clinical Social Work  Discharge planning Services  CM Consult  Post Acute Care Choice:    Choice offered to:     DME Arranged:    DME Agency:     HH Arranged:    Industry Agency:     Status of Service:  Completed, signed off  If discussed at [employer], dates discussed:    Additional Comments:  Pollie Friar, RN 12/07/2015, 2:21 PM

## 2015-12-07 NOTE — Discharge Summary (Signed)
Physician Discharge Summary  Patient ID: [name] MRN: [id number] DOB/AGE: 07/28/1945 70 y.o.  Admit date: 12/02/2015 Discharge date: 12/07/2015  Admission Diagnoses:Lumbar wound infection  Discharge Diagnoses: The same Active Problems:   Post-traumatic wound infection   Wound infection after surgery   E coli infection   Abscess of back   Sepsis due to Escherichia coli (E. coli) [facility])   Discharged Condition: good  Hospital Course: The patient was readmitted on 12/02/2015 with sepsis and a lumbar wound infection. Dr. Kathyrn Sheriff perform an incision and drainage of the wound. Cultures grew Escherichia coli. The patient was started on ceftriaxone on 12/03/2015. She was seen by infectious disease who recommended a 6 week course of IV ceftriaxone.  Physical therapy has worked with the patient. She has been progressively mobilized. Arrangements were made for her to be transferred to and would rehabilitation for further therapy and to complete her IV antibiotics. The patient, and her husband, were given written and oral discharge instructions. There was a follow-up with me in about 10 days to have her staples removed and to follow-up with infectious disease in a few weeks. All their questions were answered.  Consults: Sections disease, physical therapy, care management Significant Diagnostic Studies: None Treatments: Incision and drainage of her wound Discharge Exam: Blood pressure 125/62, pulse (!) 105, temperature 98.1 F (36.7 C), temperature source Oral, resp. rate 18, height 5\' 3"  (1.6 m), weight 102.2 kg (225 lb 5 oz), SpO2 98 %. The patient is alert and pleasant. She looks well. Her wound is healing well. Her strength is normal in her lower extremities.  Disposition: Skilled nursing facility  Discharge Instructions    Call MD for:  difficulty breathing, headache or visual disturbances    Complete by:  As directed    Call MD for:  difficulty breathing, headache or visual  disturbances    Complete by:  As directed    Call MD for:  extreme fatigue    Complete by:  As directed    Call MD for:  extreme fatigue    Complete by:  As directed    Call MD for:  hives    Complete by:  As directed    Call MD for:  hives    Complete by:  As directed    Call MD for:  persistant dizziness or light-headedness    Complete by:  As directed    Call MD for:  persistant dizziness or light-headedness    Complete by:  As directed    Call MD for:  persistant nausea and vomiting    Complete by:  As directed    Call MD for:  persistant nausea and vomiting    Complete by:  As directed    Call MD for:  redness, tenderness, or signs of infection (pain, swelling, redness, odor or green/yellow discharge around incision site)    Complete by:  As directed    Call MD for:  redness, tenderness, or signs of infection (pain, swelling, redness, odor or green/yellow discharge around incision site)    Complete by:  As directed    Call MD for:  severe uncontrolled pain    Complete by:  As directed    Call MD for:  severe uncontrolled pain    Complete by:  As directed    Call MD for:  temperature >100.4    Complete by:  As directed    Call MD for:  temperature >100.4    Complete by:  As directed  Diet - low sodium heart healthy    Complete by:  As directed    Diet - low sodium heart healthy    Complete by:  As directed    Discharge instructions    Complete by:  As directed    Call [phone] for a followup appointment. Take a stool softener while you are using pain medications.   Discharge instructions    Complete by:  As directed    Call [phone] for a followup appointment. Take a stool softener while you are using pain medications.   Driving Restrictions    Complete by:  As directed    Do not drive for 2 weeks.   Driving Restrictions    Complete by:  As directed    Do not drive for 2 weeks.   Increase activity slowly    Complete by:  As directed    Increase activity  slowly    Complete by:  As directed    Lifting restrictions    Complete by:  As directed    Do not lift more than 5 pounds. No excessive bending or twisting.   Lifting restrictions    Complete by:  As directed    Do not lift more than 5 pounds. No excessive bending or twisting.   May shower / Bathe    Complete by:  As directed    He may shower after the pain she is removed 3 days after surgery. Leave the incision alone.   May shower / Bathe    Complete by:  As directed    He may shower after the pain she is removed 3 days after surgery. Leave the incision alone.   No dressing needed    Complete by:  As directed    Remove dressing in 48 hours    Complete by:  As directed    Your stitches are under the scan and will dissolve by themselves. The Steri-Strips will fall off after you take a few showers. Do not rub back or pick at the wound, Leave the wound alone.       Medication List    STOP taking these medications   diazepam 5 MG tablet Commonly known as:  VALIUM     TAKE these medications   Alirocumab 150 MG/ML Sopn Commonly known as:  PRALUENT Inject 1 pen into the skin every 14 (fourteen) days. What changed:  additional instructions   aspirin 81 MG tablet Take 81 mg by mouth daily.   cefTRIAXone 2 g in dextrose 5 % 50 mL Inject 2 g into the vein daily.   cholecalciferol 1000 units tablet Commonly known as:  VITAMIN D Take 1,000 Units by mouth daily.   docusate sodium 100 MG capsule Commonly known as:  COLACE Take 100 mg by mouth daily.   DULoxetine 60 MG capsule Commonly known as:  CYMBALTA Take 60 mg by mouth daily.   esomeprazole 40 MG capsule Commonly known as:  NEXIUM Take 40 mg by mouth 3 (three) times daily.   estrogen-methylTESTOSTERone 0.625-1.25 MG tablet Take 1 tablet by mouth every evening.   furosemide 20 MG tablet Commonly known as:  LASIX Take 20 mg by mouth daily.   gabapentin 300 MG capsule Commonly known as:  NEURONTIN Take 300 mg by  mouth 4 (four) times daily.   hydrOXYzine 50 MG tablet Commonly known as:  ATARAX/VISTARIL Take 50 mg by mouth 2 (two) times daily.   insulin aspart 100 UNIT/ML FlexPen Commonly known as:  NOVOLOG Inject  10 Units into the skin See admin instructions. 10 units before breakfast, 10 units before lunch, 16 units at dinner, >200 increase by 2 units per sliding scale   isosorbide mononitrate 30 MG 24 hr tablet Commonly known as:  IMDUR Take 30 mg by mouth as needed.   insulin glargine 100 UNIT/ML injection Commonly known as:  LANTUS Inject 32 Units into the skin daily. Take every morning   LANTUS SOLOSTAR 100 UNIT/ML Solostar Pen Generic drug:  Insulin Glargine 32 units in am   levothyroxine 75 MCG tablet Commonly known as:  SYNTHROID, LEVOTHROID Take 75 mcg by mouth daily before breakfast.   lisinopril 20 MG tablet Commonly known as:  PRINIVIL,ZESTRIL Take 1 tablet (20 mg total) by mouth daily. What changed:  how much to take  when to take this   meloxicam 15 MG tablet Commonly known as:  MOBIC Take 15 mg by mouth at bedtime.   methocarbamol 750 MG tablet Commonly known as:  ROBAXIN Take 750 mg by mouth 3 (three) times daily as needed for muscle spasms.   metoprolol succinate 25 MG 24 hr tablet Commonly known as:  TOPROL XL Take 1 tablet (25 mg total) by mouth daily.   nitroGLYCERIN 0.4 MG SL tablet Commonly known as:  NITROSTAT Place 1 tablet (0.4 mg total) under the tongue every 5 (five) minutes as needed for chest pain.   NUCYNTA 50 MG tablet Generic drug:  tapentadol Take 50 mg by mouth 3 (three) times daily as needed for moderate pain. Patient states she has to take this medication with food   Tapentadol HCl 100 MG Tabs Commonly known as:  NUCYNTA Take 1 tablet (100 mg total) by mouth every 4 (four) hours as needed for moderate pain.   propranolol 20 MG tablet Commonly known as:  INDERAL TAKE 1 TABLET THREE TIMES A DAY AS NEEDED   ropinirole 5 MG  tablet Commonly known as:  REQUIP Take 5 mg by mouth 2 (two) times daily.   temazepam 30 MG capsule Commonly known as:  RESTORIL Take 30 mg by mouth at bedtime.   torsemide 20 MG tablet Commonly known as:  DEMADEX Take 20 mg by mouth See admin instructions. Take one tablet every third day. No specific days. Patient states she has not taken this medication in the last few days from today (12-02-15)   traMADol 50 MG tablet Commonly known as:  ULTRAM Take 50 mg by mouth 3 (three) times daily as needed for moderate pain.   traMADol 50 MG tablet Commonly known as:  ULTRAM Take 1 tablet (50 mg total) by mouth every 6 (six) hours as needed for moderate pain.        SignedNewman Pies D 12/07/2015, 11:04 AM

## 2015-12-07 NOTE — Care Management Important Message (Signed)
Important Message  Patient Details  Name: [name] MRN: [id number] Date of Birth: 1945-11-09   Medicare Important Message Given:  Yes    Jarrod Bodkins 12/07/2015, 11:18 AM

## 2015-12-07 NOTE — Progress Notes (Signed)
Report called to [employer] at [facility].  Awaiting PTAR for transport.  Will continue to monitor.  Cori Razor, RN

## 2015-12-08 LAB — GLUCOSE, CAPILLARY
GLUCOSE-CAPILLARY: 142 mg/dL — AB (ref 65–99)
Glucose-Capillary: 195 mg/dL — ABNORMAL HIGH (ref 65–99)
Glucose-Capillary: 147 mg/dL — ABNORMAL HIGH (ref 65–99)
Glucose-Capillary: 145 mg/dL — ABNORMAL HIGH (ref 65–99)

## 2015-12-09 LAB — GLUCOSE, CAPILLARY
GLUCOSE-CAPILLARY: 142 mg/dL — AB (ref 65–99)
GLUCOSE-CAPILLARY: 61 mg/dL — AB (ref 65–99)
GLUCOSE-CAPILLARY: 80 mg/dL (ref 65–99)
Glucose-Capillary: 167 mg/dL — ABNORMAL HIGH (ref 65–99)
Glucose-Capillary: 111 mg/dL — ABNORMAL HIGH (ref 65–99)
Glucose-Capillary: 55 mg/dL — ABNORMAL LOW (ref 65–99)
Glucose-Capillary: 66 mg/dL (ref 65–99)

## 2015-12-11 LAB — GLUCOSE, CAPILLARY
GLUCOSE-CAPILLARY: 169 mg/dL — AB (ref 65–99)
GLUCOSE-CAPILLARY: 122 mg/dL — AB (ref 65–99)
GLUCOSE-CAPILLARY: 59 mg/dL — AB (ref 65–99)
Glucose-Capillary: 213 mg/dL — ABNORMAL HIGH (ref 65–99)
Glucose-Capillary: 170 mg/dL — ABNORMAL HIGH (ref 65–99)
Glucose-Capillary: 207 mg/dL — ABNORMAL HIGH (ref 65–99)
Glucose-Capillary: 163 mg/dL — ABNORMAL HIGH (ref 65–99)

## 2015-12-13 LAB — GLUCOSE, CAPILLARY
GLUCOSE-CAPILLARY: 82 mg/dL (ref 65–99)
GLUCOSE-CAPILLARY: 87 mg/dL (ref 65–99)
GLUCOSE-CAPILLARY: 136 mg/dL — AB (ref 65–99)
GLUCOSE-CAPILLARY: 139 mg/dL — AB (ref 65–99)
Glucose-Capillary: 145 mg/dL — ABNORMAL HIGH (ref 65–99)
Glucose-Capillary: 162 mg/dL — ABNORMAL HIGH (ref 65–99)
Glucose-Capillary: 117 mg/dL — ABNORMAL HIGH (ref 65–99)
Glucose-Capillary: 109 mg/dL — ABNORMAL HIGH (ref 65–99)
Glucose-Capillary: 175 mg/dL — ABNORMAL HIGH (ref 65–99)

## 2015-12-14 ENCOUNTER — Encounter
Admission: RE | Admit: 2015-12-14 | Discharge: 2015-12-14 | Disposition: A | Discharge status: Home / Self Care | Payer: Medicare Other | Source: Ambulatory Visit | Attending: Internal Medicine | Admitting: Internal Medicine

## 2015-12-14 LAB — GLUCOSE, CAPILLARY
GLUCOSE-CAPILLARY: 148 mg/dL — AB (ref 65–99)
Glucose-Capillary: 97 mg/dL (ref 65–99)
Glucose-Capillary: 223 mg/dL — ABNORMAL HIGH (ref 65–99)
Glucose-Capillary: 87 mg/dL (ref 65–99)

## 2015-12-15 LAB — GLUCOSE, CAPILLARY
GLUCOSE-CAPILLARY: 186 mg/dL — AB (ref 65–99)
GLUCOSE-CAPILLARY: 123 mg/dL — AB (ref 65–99)
Glucose-Capillary: 161 mg/dL — ABNORMAL HIGH (ref 65–99)
Glucose-Capillary: 149 mg/dL — ABNORMAL HIGH (ref 65–99)

## 2015-12-16 LAB — GLUCOSE, CAPILLARY
GLUCOSE-CAPILLARY: 142 mg/dL — AB (ref 65–99)
Glucose-Capillary: 148 mg/dL — ABNORMAL HIGH (ref 65–99)
Glucose-Capillary: 204 mg/dL — ABNORMAL HIGH (ref 65–99)

## 2015-12-18 LAB — GLUCOSE, CAPILLARY
GLUCOSE-CAPILLARY: 83 mg/dL (ref 65–99)
GLUCOSE-CAPILLARY: 111 mg/dL — AB (ref 65–99)
GLUCOSE-CAPILLARY: 109 mg/dL — AB (ref 65–99)
GLUCOSE-CAPILLARY: 88 mg/dL (ref 65–99)
Glucose-Capillary: 107 mg/dL — ABNORMAL HIGH (ref 65–99)
Glucose-Capillary: 138 mg/dL — ABNORMAL HIGH (ref 65–99)
Glucose-Capillary: 109 mg/dL — ABNORMAL HIGH (ref 65–99)
Glucose-Capillary: 124 mg/dL — ABNORMAL HIGH (ref 65–99)

## 2015-12-20 LAB — GLUCOSE, CAPILLARY
GLUCOSE-CAPILLARY: 124 mg/dL — AB (ref 65–99)
Glucose-Capillary: 175 mg/dL — ABNORMAL HIGH (ref 65–99)

## 2015-12-28 ENCOUNTER — Ambulatory Visit (INDEPENDENT_AMBULATORY_CARE_PROVIDER_SITE_OTHER): Payer: Medicare Other | Admitting: Internal Medicine

## 2015-12-28 ENCOUNTER — Encounter: Payer: Self-pay | Admitting: Internal Medicine

## 2015-12-28 VITALS — BP 123/75 | HR 91 | Temp 98.6°F | Wt 209.0 lb

## 2015-12-28 DIAGNOSIS — I2510 Atherosclerotic heart disease of native coronary artery without angina pectoris: Secondary | ICD-10-CM | POA: Diagnosis not present

## 2015-12-28 DIAGNOSIS — T814XXD Infection following a procedure, subsequent encounter: Secondary | ICD-10-CM | POA: Diagnosis not present

## 2015-12-28 DIAGNOSIS — IMO0001 Reserved for inherently not codable concepts without codable children: Secondary | ICD-10-CM

## 2015-12-28 DIAGNOSIS — A498 Other bacterial infections of unspecified site: Secondary | ICD-10-CM

## 2015-12-28 DIAGNOSIS — M4626 Osteomyelitis of vertebra, lumbar region: Secondary | ICD-10-CM

## 2015-12-28 DIAGNOSIS — M869 Osteomyelitis, unspecified: Secondary | ICD-10-CM | POA: Diagnosis not present

## 2015-12-28 NOTE — Progress Notes (Signed)
Patient ID: [name], female   DOB: 05/30/1945, 70 y.o.   MRN: [id number]  HPI 70 y.o. female with  E. Coli infection. She initially  underwent L3-S1 fusion on 10/2 and discharged on 10/6 to nursing home, though the family reports they left the SNF since they felt that it was not good care. When she went home she was still having drainage from wound, serosanginous?, but started to have progressive pain, fevers, drainage from wound. She was seen by dr Arnoldo Morale on 10/18 and patient was prescribed cephalexin. On follow up in the neursurgery office on 10/20, she had increasing fever with severe HA, with purulent drainage. the decision was made to admitted for wash out. She now admitted with deep infection at the operative site, tracking down sub-fascial. She underwent I x D by Dr Kathyrn Sheriff with OR cultures showing E coli.She was Seen by dr. Lucianne Lei dam who recommended 6 wk of ceftriaxone to end on dec 1st.   Outpatient Encounter Prescriptions as of 12/28/2015  Medication Sig  . Alirocumab (PRALUENT) 150 MG/ML SOPN Inject 1 pen into the skin every 14 (fourteen) days. (Patient taking differently: Inject 1 pen into the skin every 14 (fourteen) days. Last dose was on October 3rd or 4th 2017)  . aspirin 81 MG tablet Take 81 mg by mouth daily.    . cefTRIAXone 2 g in dextrose 5 % 50 mL Inject 2 g into the vein daily.  . cholecalciferol (VITAMIN D) 1000 UNITS tablet Take 1,000 Units by mouth daily.  [name] docusate sodium (COLACE) 100 MG capsule Take 100 mg by mouth daily.  . DULoxetine (CYMBALTA) 60 MG capsule Take 60 mg by mouth daily.  [name] esomeprazole (NEXIUM) 40 MG capsule Take 40 mg by mouth 3 (three) times daily.   [name] estrogen-methylTESTOSTERone 0.625-1.25 MG per tablet Take 1 tablet by mouth every evening.   . furosemide (LASIX) 20 MG tablet Take 20 mg by mouth daily.  [name] gabapentin (NEURONTIN) 300 MG capsule Take 300 mg by mouth 4 (four) times daily.   . hydrOXYzine (ATARAX/VISTARIL) 50 MG tablet Take  50 mg by mouth 2 (two) times daily.   . insulin aspart (NOVOLOG) 100 UNIT/ML FlexPen Inject 10 Units into the skin See admin instructions. 10 units before breakfast, 10 units before lunch, 16 units at dinner, >200 increase by 2 units per sliding scale  . insulin glargine (LANTUS) 100 UNIT/ML injection Inject 32 Units into the skin daily. Take every morning  . LANTUS SOLOSTAR 100 UNIT/ML Solostar Pen 32 units in am  . levothyroxine (SYNTHROID, LEVOTHROID) 75 MCG tablet Take 75 mcg by mouth daily before breakfast.   . lisinopril (PRINIVIL,ZESTRIL) 20 MG tablet Take 1 tablet (20 mg total) by mouth daily. (Patient taking differently: Take 10 mg by mouth 2 (two) times daily. )  . meloxicam (MOBIC) 15 MG tablet Take 15 mg by mouth at bedtime.   . methocarbamol (ROBAXIN) 750 MG tablet Take 750 mg by mouth 3 (three) times daily as needed for muscle spasms.   . metoprolol succinate (TOPROL XL) 25 MG 24 hr tablet Take 1 tablet (25 mg total) by mouth daily.  . propranolol (INDERAL) 20 MG tablet TAKE 1 TABLET THREE TIMES A DAY AS NEEDED  . ropinirole (REQUIP) 5 MG tablet Take 5 mg by mouth 2 (two) times daily.  . tapentadol (NUCYNTA) 50 MG tablet Take 50 mg by mouth 3 (three) times daily as needed for moderate pain. Patient states she has to take this  medication with food  . Tapentadol HCl 100 MG TABS Take 1 tablet (100 mg total) by mouth every 4 (four) hours as needed for moderate pain.  [name] temazepam (RESTORIL) 30 MG capsule Take 30 mg by mouth at bedtime.  . torsemide (DEMADEX) 20 MG tablet Take 20 mg by mouth See admin instructions. Take one tablet every third day. No specific days. Patient states she has not taken this medication in the last few days from today (12-02-15)  . traMADol (ULTRAM) 50 MG tablet Take 50 mg by mouth 3 (three) times daily as needed for moderate pain.  . traMADol (ULTRAM) 50 MG tablet Take 1 tablet (50 mg total) by mouth every 6 (six) hours as needed for moderate pain.  . isosorbide  mononitrate (IMDUR) 30 MG 24 hr tablet Take 30 mg by mouth as needed.  . nitroGLYCERIN (NITROSTAT) 0.4 MG SL tablet Place 1 tablet (0.4 mg total) under the tongue every 5 (five) minutes as needed for chest pain.   No facility-administered encounter medications on file as of 12/28/2015.      Patient Active Problem List   Diagnosis Date Noted  . E coli infection   . Abscess of back   . Sepsis due to Escherichia coli (E. coli) ([place])   . 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 Wound infection after surgery 12/02/2015  . Lumbar degenerative disc disease 11/14/2015  . Itching 02/24/2014  . Anxiety 02/24/2014  . Fatigue 10/23/2013  . Chest pain 06/09/2012  . CAD (coronary artery disease) 06/09/2012  . Hyperlipidemia 08/09/2011  . Arm pain 06/28/2011  . Tachycardia 05/05/2010  . Diabetes mellitus ([place]) 05/05/2010  . OSA on CPAP 05/05/2010  . HTN (hypertension) 05/05/2010  . Edema 05/05/2010  . HYPERTRIGLYCERIDEMIA 06/08/2008  . DYSPNEA 06/08/2008     Health Maintenance Due  Topic Date Due  . FOOT EXAM  06/24/1955  . OPHTHALMOLOGY EXAM  06/24/1955  . TETANUS/TDAP  06/23/1964  . COLONOSCOPY  06/24/1995  . ZOSTAVAX  06/23/2005  . HEMOGLOBIN A1C  03/31/2010  . PNA vac Low Risk Adult (2 of 2 - PPSV23) 11/17/2015     Review of Systems Review of Systems  Constitutional: Negative for fever, chills, diaphoresis, activity change, appetite change, fatigue and unexpected weight change.  HENT: Negative for congestion, sore throat, rhinorrhea, sneezing, trouble swallowing and sinus pressure.  Eyes: Negative for photophobia and visual disturbance.  Respiratory: Negative for cough, chest tightness, shortness of breath, wheezing and stridor.  Cardiovascular: Negative for chest pain, palpitations and leg swelling.  Gastrointestinal: Negative for nausea, vomiting, abdominal pain, diarrhea, constipation, blood in stool, abdominal distention and anal bleeding.  Genitourinary: Negative for  dysuria, hematuria, flank pain and difficulty urinating.  Musculoskeletal: + back pain, improving Skin: Negative for color change, pallor, rash and wound.  Neurological: Negative for dizziness, tremors, weakness and light-headedness.  Hematological: Negative for adenopathy. Does not bruise/bleed easily.  Psychiatric/Behavioral: Negative for behavioral problems, confusion, sleep disturbance, dysphoric mood, decreased concentration and agitation.    Physical Exam   BP 123/75   Pulse 91   Temp 98.6 F (37 C) (Oral)   Wt 209 lb (94.8 kg)   BMI 37.02 kg/m  Physical Exam  Constitutional:  oriented to person, place, and time. appears well-developed and well-nourished. No distress.  HENT: /AT, PERRLA, no scleral icterus Mouth/Throat: Oropharynx is clear and moist. No oropharyngeal exudate.  Cardiovascular: Normal rate, regular rhythm and normal heart sounds. Exam reveals no gallop and no friction rub.  No murmur heard.  Pulmonary/Chest: Effort normal and breath sounds normal. No respiratory distress.  has no wheezes.  Neck = supple, no nuchal rigidity Abdominal: Soft. Bowel sounds are normal.  exhibits no distension. There is no tenderness.  Lymphadenopathy: no cervical adenopathy. No axillary adenopathy Neurological: alert and oriented to person, place, and time.  Skin: incision on her back is 5 cm long nearly 1.8 cm wide. Has slight fibrinous debris in the wound bed Psychiatric: a normal mood and affect.  behavior is normal.   No results found for: CD4TCELL No results found for: CD4TABS No results found for: HIV1RNAQUANT No results found for: HEPBSAB No results found for: RPR  CBC Lab Results  Component Value Date   WBC 9.4 12/02/2015   RBC 3.79 (L) 12/02/2015   HGB 10.4 (L) 12/02/2015   HCT 33.0 (L) 12/02/2015   PLT 413 (H) 12/02/2015   MCV 87.1 12/02/2015   MCH 27.4 12/02/2015   MCHC 31.5 12/02/2015   RDW 13.5 12/02/2015   LYMPHSABS 1.7 12/02/2015   MONOABS 0.9  12/02/2015   EOSABS 0.0 12/02/2015   BASOSABS 0.0 12/02/2015   BMET Lab Results  Component Value Date   NA 134 (L) 12/02/2015   K 3.9 12/02/2015   CL 97 (L) 12/02/2015   CO2 26 12/02/2015   GLUCOSE 230 (H) 12/02/2015   BUN 11 12/02/2015   CREATININE 1.17 (H) 12/02/2015   CALCIUM 8.7 (L) 12/02/2015   GFRNONAA 46 (L) 12/02/2015   GFRAA 53 (L) 12/02/2015   Lab Results  Component Value Date   ESRSEDRATE 130 (H) 12/04/2015   Lab Results  Component Value Date   CRP 30.7 (H) 12/04/2015     Assessment and Plan   ecoli surgical site infection = the patient will continue on abtx through dec 1st. Will check sed rate and crp prior to abtx stopping so that we can decide if need to extend to eight weeks  Wound bed = will recommend advance to do wound care management to help healing. May need to use santyl on wound bed. Will touch base with home health to see how wound is healing  Spent 40 min with patient with greater than 50% in coordination of care

## 2016-01-03 ENCOUNTER — Telehealth: Payer: Self-pay | Admitting: Cardiovascular Disease

## 2016-01-03 MED ORDER — ALIROCUMAB 150 MG/ML ~~LOC~~ SOPN: 1.0000 "pen " | PEN_INJECTOR | SUBCUTANEOUS | 12 refills | Status: DC

## 2016-01-03 NOTE — Telephone Encounter (Signed)
New Rx sent to [facility]

## 2016-01-03 NOTE — Telephone Encounter (Signed)
Pt states she has been approved for Praluent in June. She states her pharmacy states her rx has expired. After this month, she has no Praluent left. Please call.

## 2016-01-06 ENCOUNTER — Encounter: Payer: Self-pay | Admitting: Infectious Disease

## 2016-01-11 ENCOUNTER — Encounter: Payer: Self-pay | Admitting: Infectious Disease

## 2016-01-12 ENCOUNTER — Telehealth: Payer: Self-pay | Admitting: Cardiovascular Disease

## 2016-01-12 ENCOUNTER — Telehealth: Payer: Self-pay | Admitting: Pharmacist

## 2016-01-12 ENCOUNTER — Other Ambulatory Visit: Payer: Self-pay | Admitting: *Deleted

## 2016-01-12 ENCOUNTER — Encounter: Payer: Self-pay | Admitting: Infectious Disease

## 2016-01-12 DIAGNOSIS — T814XXD Infection following a procedure, subsequent encounter: Principal | ICD-10-CM

## 2016-01-12 DIAGNOSIS — IMO0001 Reserved for inherently not codable concepts without codable children: Secondary | ICD-10-CM

## 2016-01-12 MED ORDER — ALIROCUMAB 150 MG/ML ~~LOC~~ SOPN: 1.0000 "pen " | PEN_INJECTOR | SUBCUTANEOUS | 12 refills | Status: DC

## 2016-01-12 NOTE — Telephone Encounter (Signed)
Pt is calling stating we sent in a refill on praluent 150 mg to a local pharmacy and she normally receives it from [place]  She needs this sent in for 3 months Please send to correct pharmacy

## 2016-01-12 NOTE — Telephone Encounter (Signed)
Requested Prescriptions   Signed Prescriptions Disp Refills  . Alirocumab (PRALUENT) 150 MG/ML SOPN 6 pen 12    Sig: Inject 1 pen into the skin every 14 (fourteen) days.    Authorizing Provider: Minna Merritts    Ordering User: Britt Bottom

## 2016-01-12 NOTE — Telephone Encounter (Signed)
Called [facility], spoke with RN, gave verbal orders to extend patient's antibiotics (ceftriaxone) for 2 more weeks - stop date now 01/27/16.

## 2016-01-16 ENCOUNTER — Encounter: Payer: Self-pay | Admitting: Infectious Disease

## 2016-01-16 ENCOUNTER — Telehealth: Payer: Self-pay | Admitting: *Deleted

## 2016-01-16 ENCOUNTER — Ambulatory Visit (INDEPENDENT_AMBULATORY_CARE_PROVIDER_SITE_OTHER): Payer: Medicare Other | Admitting: Infectious Disease

## 2016-01-16 VITALS — BP 131/83 | HR 99 | Temp 98.0°F | Ht 63.0 in | Wt 211.0 lb

## 2016-01-16 DIAGNOSIS — L089 Local infection of the skin and subcutaneous tissue, unspecified: Secondary | ICD-10-CM | POA: Diagnosis not present

## 2016-01-16 DIAGNOSIS — I2510 Atherosclerotic heart disease of native coronary artery without angina pectoris: Secondary | ICD-10-CM

## 2016-01-16 DIAGNOSIS — A4151 Sepsis due to Escherichia coli [E. coli]: Secondary | ICD-10-CM | POA: Diagnosis not present

## 2016-01-16 DIAGNOSIS — T148XXA Other injury of unspecified body region, initial encounter: Secondary | ICD-10-CM | POA: Diagnosis present

## 2016-01-16 DIAGNOSIS — T814XXA Infection following a procedure, initial encounter: Secondary | ICD-10-CM | POA: Diagnosis not present

## 2016-01-16 DIAGNOSIS — L02212 Cutaneous abscess of back [any part, except buttock]: Secondary | ICD-10-CM | POA: Diagnosis not present

## 2016-01-16 DIAGNOSIS — IMO0001 Reserved for inherently not codable concepts without codable children: Secondary | ICD-10-CM

## 2016-01-16 MED ORDER — SULFAMETHOXAZOLE-TRIMETHOPRIM 800-160 MG PO TABS: 1.0000 | ORAL_TABLET | Freq: Two times a day (BID) | ORAL | 11 refills | Status: DC

## 2016-01-16 NOTE — Progress Notes (Signed)
Subjective:    Patient ID: [name], female    DOB: 1945/07/01, 70 y.o.   MRN: [id number]  HPI  70 y.o. femalewho underwent L3-S1 fusion on 10/2. She was discharged to SNF, and has been home for a few days. She was seen this  4 days pta with low-grade fevers and no significant drainage. Unfortunately, over the last 24 hours she has been having fever > 101.5, developed severe HA , and has had yellow-brown drainage from her wound. When she was admitted there was concern for sepsis and BLOOD  cultures were drawn and she was started on vancomycin and Zosyn and she was taken the operating room where she underwent debridement and cultures of been taken which now are growing moderate Escherichia. He is placed on ceftriaxone by myself and has been on this antibiotic for more than 6 weeks. Her sedimentation rate is come down from 130 into the 60s but C-reactive protein is gone from 30s up to 55. Her back pain has largely improved but now she has some lower back pain with sciatic component radiation down her right thigh. I've ordered an MRI but is not yet been done of extended antibiotics for an additional 2 weeks.  His chills nausea or malaise.    Past Medical History:  Diagnosis Date  . Arthritis   . Chronic back pain    stenosis.degenerative disc,some scoliosis  . Constipation    takes Stool Softener daily  . Coronary artery disease   . Depression    takes Cymbalta daily  . Diabetes mellitus    Type 2 diabetic. Average fasting blood sugar runs high 170-200  . Diverticulosis   . GERD (gastroesophageal reflux disease)    takes Nexium daily  . Hemorrhoids   . History of colon polyps    benign  . History of gout    doesn't take any meds  . History of vertigo    doesn't take any meds  . Hyperlipidemia    takes Praluent daily  . Hypertension    takes Imdur,Lisinopril,and Metoprolol daily  . Hypothyroidism    takes Synthroid daily  . Insomnia    takes Restoril nightly  . Joint  pain   . Joint swelling   . Muscle spasm    takes Robaxin as needed  . OSA on CPAP   . Peripheral edema    takes LAsix as needed  . Restless leg    takes Requip daily  . Rosacea   . Sleep apnea   . Varicose veins   . Weakness    numbness and tingling.    Past Surgical History:  Procedure Laterality Date  . ABDOMINAL HYSTERECTOMY     with BSo  . CARDIAC CATHETERIZATION  2013   Normal  . CARPAL TUNNEL RELEASE Bilateral   . CATARACT EXTRACTION, BILATERAL    . CHOLECYSTECTOMY    . COLONOSCOPY    . DIAGNOSTIC LAPAROSCOPY     multiple times  . DILATION AND CURETTAGE OF UTERUS    . ESOPHAGOGASTRODUODENOSCOPY (EGD) WITH PROPOFOL N/A 11/01/2014   Procedure: ESOPHAGOGASTRODUODENOSCOPY (EGD) WITH PROPOFOL;  Surgeon: Hulen Luster, MD;  Location: [facility] ENDOSCOPY;  Service: Gastroenterology;  Laterality: N/A;  . IMPLANTABLE CONTACT LENS IMPLANTATION     bilateral  . LAMINECTOMY  11/13/2015  . LUMBAR WOUND DEBRIDEMENT N/A 12/02/2015   Procedure: WOUND Exploration;  Surgeon: Consuella Lose, MD;  Location: [place];  Service: Neurosurgery;  Laterality: N/A;  . ROTATOR CUFF REPAIR Left   .  SAVORY DILATION N/A 11/01/2014   Procedure: SAVORY DILATION;  Surgeon: Hulen Luster, MD;  Location[facility] ENDOSCOPY;  Service: Gastroenterology;  Laterality: N/A;  . TONSILLECTOMY    . TRIGGER FINGER RELEASE Bilateral     Family History  Problem Relation Age of Onset  . Heart disease Mother   . Breast cancer Mother 72  . Heart disease Father   . Heart attack Sister   . Heart disease Sister   . Heart disease Brother       Social History   Social History  . Marital status: Married    Spouse name: N/A  . Number of children: N/A  . Years of education: N/A   Social History Main Topics  . Smoking status: Never Smoker  . Smokeless tobacco: Never Used  . Alcohol use No  . Drug use: No  . Sexual activity: Not Asked   Other Topics Concern  . None   Social History Narrative  . None    Allergies   Allergen Reactions  . Ceftin [Cefuroxime Axetil] Diarrhea    diarrhea  . Codeine Rash       . Penicillins Rash    Has patient had a PCN reaction causing immediate rash, facial/tongue/throat swelling, SOB or lightheadedness with hypotension: YES Has patient had a PCN reaction causing severe rash involving mucus membranes or skin necrosis: NO Has patient had a PCN retioion that required hospitalization NO Has patient had a PCN reaction occurring within the last 10 years: YES If all of the above answers are "NO", then may proceed with Cephalosporin use.  . Vicodin [Hydrocodone-Acetaminophen] Rash     Also passes out     Current Outpatient Prescriptions:  .  Alirocumab (PRALUENT) 150 MG/ML SOPN, Inject 1 pen into the skin every 14 (fourteen) days., Disp: 6 pen, Rfl: 12 .  aspirin 81 MG tablet, Take 81 mg by mouth daily.  , Disp: , Rfl:  .  cholecalciferol (VITAMIN D) 1000 UNITS tablet, Take 1,000 Units by mouth daily., Disp: , Rfl:  .  docusate sodium (COLACE) 100 MG capsule, Take 100 mg by mouth daily., Disp: , Rfl:  .  DULoxetine (CYMBALTA) 60 MG capsule, Take 60 mg by mouth daily., Disp: , Rfl:  .  esomeprazole (NEXIUM) 40 MG capsule, Take 40 mg by mouth 3 (three) times daily. , Disp: , Rfl:  .  estrogen-methylTESTOSTERone 0.625-1.25 MG per tablet, Take 1 tablet by mouth every evening. , Disp: , Rfl:  .  furosemide (LASIX) 20 MG tablet, Take 20 mg by mouth daily., Disp: , Rfl:  .  gabapentin (NEURONTIN) 300 MG capsule, Take 300 mg by mouth 4 (four) times daily. , Disp: , Rfl:  .  hydrOXYzine (ATARAX/VISTARIL) 50 MG tablet, Take 50 mg by mouth 2 (two) times daily. , Disp: , Rfl:  .  insulin aspart (NOVOLOG) 100 UNIT/ML FlexPen, Inject 10 Units into the skin See admin instructions. 10 units before breakfast, 10 units before lunch, 16 units at dinner, >200 increase by 2 units per sliding scale, Disp: , Rfl:  .  insulin glargine (LANTUS) 100 UNIT/ML injection, Inject 32 Units into the skin  daily. Take every morning, Disp: , Rfl:  .  isosorbide mononitrate (IMDUR) 30 MG 24 hr tablet, Take 30 mg by mouth as needed., Disp: , Rfl:  .  LANTUS SOLOSTAR 100 UNIT/ML Solostar Pen, 32 units in am, Disp: , Rfl: 0 .  levothyroxine (SYNTHROID, LEVOTHROID) 75 MCG tablet, Take 75 mcg by mouth daily before  breakfast. , Disp: , Rfl:  .  lisinopril (PRINIVIL,ZESTRIL) 20 MG tablet, Take 1 tablet (20 mg total) by mouth daily. (Patient taking differently: Take 10 mg by mouth 2 (two) times daily. ), Disp: 90 tablet, Rfl: 3 .  meloxicam (MOBIC) 15 MG tablet, Take 15 mg by mouth at bedtime. , Disp: , Rfl:  .  methocarbamol (ROBAXIN) 750 MG tablet, Take 750 mg by mouth 3 (three) times daily as needed for muscle spasms. , Disp: , Rfl:  .  metoprolol succinate (TOPROL XL) 25 MG 24 hr tablet, Take 1 tablet (25 mg total) by mouth daily., Disp: 90 tablet, Rfl: 3 .  nitroGLYCERIN (NITROSTAT) 0.4 MG SL tablet, Place 1 tablet (0.4 mg total) under the tongue every 5 (five) minutes as needed for chest pain., Disp: 25 tablet, Rfl: 3 .  propranolol (INDERAL) 20 MG tablet, TAKE 1 TABLET THREE TIMES A DAY AS NEEDED, Disp: 180 tablet, Rfl: 3 .  ropinirole (REQUIP) 5 MG tablet, Take 5 mg by mouth 2 (two) times daily., Disp: , Rfl:  .  sulfamethoxazole-trimethoprim (BACTRIM DS) 800-160 MG tablet, Take 1 tablet by mouth 2 (two) times daily., Disp: 60 tablet, Rfl: 11 .  tapentadol (NUCYNTA) 50 MG tablet, Take 50 mg by mouth 3 (three) times daily as needed for moderate pain. Patient states she has to take this medication with food, Disp: , Rfl:  .  Tapentadol HCl 100 MG TABS, Take 1 tablet (100 mg total) by mouth every 4 (four) hours as needed for moderate pain., Disp: 60 tablet, Rfl: 0 .  temazepam (RESTORIL) 30 MG capsule, Take 30 mg by mouth at bedtime., Disp: , Rfl:  .  torsemide (DEMADEX) 20 MG tablet, Take 20 mg by mouth See admin instructions. Take one tablet every third day. No specific days. Patient states she has not  taken this medication in the last few days from today (12-02-15), Disp: , Rfl:  .  traMADol (ULTRAM) 50 MG tablet, Take 50 mg by mouth 3 (three) times daily as needed for moderate pain., Disp: , Rfl:  .  traMADol (ULTRAM) 50 MG tablet, Take 1 tablet (50 mg total) by mouth every 6 (six) hours as needed for moderate pain., Disp: 60 tablet, Rfl: 1   Review of Systems  Constitutional: Negative for chills and fever.  HENT: Negative for congestion and sore throat.   Eyes: Negative for photophobia.  Respiratory: Negative for cough, shortness of breath and wheezing.   Cardiovascular: Negative for chest pain, palpitations and leg swelling.  Gastrointestinal: Negative for abdominal pain, blood in stool, constipation, diarrhea, nausea and vomiting.  Genitourinary: Negative for dysuria, flank pain and hematuria.  Musculoskeletal: Positive for back pain. Negative for myalgias.  Skin: Positive for wound. Negative for rash.  Neurological: Negative for dizziness, weakness and headaches.  Hematological: Does not bruise/bleed easily.  Psychiatric/Behavioral: Negative for suicidal ideas.       Objective:   Physical Exam  Constitutional: She is oriented to person, place, and time. She appears well-developed and well-nourished. No distress.  HENT:  Head: Normocephalic and atraumatic.  Mouth/Throat: No oropharyngeal exudate.  Eyes: Conjunctivae and EOM are normal. No scleral icterus.  Neck: Normal range of motion. Neck supple.  Cardiovascular: Normal rate and regular rhythm.   Pulmonary/Chest: Effort normal. No respiratory distress. She has no wheezes.  Abdominal: She exhibits no distension.  Musculoskeletal: She exhibits no edema or tenderness.  Neurological: She is alert and oriented to person, place, and time. She exhibits normal muscle tone.  Coordination normal.  Skin: Skin is warm and dry. No rash noted. She is not diaphoretic. No erythema. No pallor.  Psychiatric: She has a normal mood and affect.  Her behavior is normal. Judgment and thought content normal.   Surgical site 01/16/2016:    PICC Line 01/16/16:            Assessment & Plan:   Post operative infection that was treated with debridement of soft tissue but with concern for deep infection such as discitis, potential osteomyelitis with hardware involvement: We will check an MRI to see if she needs another surgical intervention and we have extended her antibiotic solution additional 2 weeks with IV antibiotics. If she is doing well I will switch her over to oral Bactrim twice a day and extend this out for likely a year of treatment. I'm worried though with the new elevation in her CRP and new worsening back pain that she may have need for further surgery.  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

## 2016-01-16 NOTE — Telephone Encounter (Signed)
MRI order corrected per Dr Tommy Medal to be with and without contrast.  It is scheduled for 12/7 at 3:00 (arrive 2:45) [facility].  RN left patient a phone message with appointment information, sent mychart message as well. Patient will need to call Radiology Scheduling to answer questions regarding her implants. [phone]. Patient has Red/white/blue  Medicare and tricare - no prior authorization. Patient wants to know if Dr Tommy Medal wants her to use santyl on her back during wound changes - if so, please send order to home health. Landis Gandy, RN

## 2016-01-18 ENCOUNTER — Other Ambulatory Visit: Payer: Self-pay | Admitting: *Deleted

## 2016-01-18 DIAGNOSIS — T814XXS Infection following a procedure, sequela: Principal | ICD-10-CM

## 2016-01-18 DIAGNOSIS — IMO0001 Reserved for inherently not codable concepts without codable children: Secondary | ICD-10-CM

## 2016-01-18 MED ORDER — COLLAGENASE 250 UNIT/GM EX OINT: TOPICAL_OINTMENT | CUTANEOUS | 0 refills | Status: DC

## 2016-01-19 ENCOUNTER — Ambulatory Visit (HOSPITAL_COMMUNITY): Admission: RE | Admit: 2016-01-19 | Payer: Medicare Other | Source: Ambulatory Visit

## 2016-01-24 ENCOUNTER — Ambulatory Visit (HOSPITAL_COMMUNITY)
Admission: RE | Admit: 2016-01-24 | Discharge: 2016-01-24 | Disposition: A | Discharge status: Home / Self Care | Payer: Medicare Other | Source: Ambulatory Visit | Attending: Infectious Disease | Admitting: Infectious Disease

## 2016-01-24 ENCOUNTER — Encounter: Payer: Self-pay | Admitting: Infectious Disease

## 2016-01-24 DIAGNOSIS — L02212 Cutaneous abscess of back [any part, except buttock]: Secondary | ICD-10-CM | POA: Diagnosis not present

## 2016-01-24 DIAGNOSIS — Z981 Arthrodesis status: Secondary | ICD-10-CM | POA: Insufficient documentation

## 2016-01-24 DIAGNOSIS — M5135 Other intervertebral disc degeneration, thoracolumbar region: Secondary | ICD-10-CM | POA: Diagnosis not present

## 2016-01-24 DIAGNOSIS — R609 Edema, unspecified: Secondary | ICD-10-CM | POA: Diagnosis not present

## 2016-01-24 DIAGNOSIS — A4151 Sepsis due to Escherichia coli [E. coli]: Secondary | ICD-10-CM | POA: Diagnosis not present

## 2016-01-24 LAB — POCT I-STAT CREATININE: CREATININE: 0.9 mg/dL (ref 0.44–1.00)

## 2016-01-24 MED ORDER — GADOBENATE DIMEGLUMINE 529 MG/ML IV SOLN
20.0000 mL | Freq: Once | INTRAVENOUS | Status: AC | PRN
  Administered 2016-01-24: 19 mL via INTRAVENOUS

## 2016-01-25 NOTE — Telephone Encounter (Signed)
Patient saw Dr Arnoldo Morale 12/12. She states he reviewed the films, but the report was not ready at the visit time.  Patient states that he was going to call to discuss options with Dr Tommy Medal. He is reportedly considering a ct-guided biopsy to see if this is infection. Per patient, she is not sure if she is supposed to continue IV medication at this time. Patient states Dr Arnoldo Morale was in surgery today.  She is scheduled to see him in 2 weeks. She will be out of medication from [place] on Friday. Please advise. Landis Gandy, RN

## 2016-01-25 NOTE — Telephone Encounter (Signed)
-----   Message from Truman Hayward, MD sent at 01/24/2016  2:51 PM EST ----- [name] has a pretty large fluid Collection consistent with an abscessThat needs to be operated upon.Can we make sure that she is seeing her surgeonUrgently so this can be addressed.She may need to be admitted to hospital I would expect

## 2016-01-27 ENCOUNTER — Other Ambulatory Visit: Payer: Self-pay | Admitting: Neurosurgery

## 2016-01-27 DIAGNOSIS — M545 Low back pain: Principal | ICD-10-CM

## 2016-01-27 DIAGNOSIS — G8929 Other chronic pain: Secondary | ICD-10-CM

## 2016-02-07 ENCOUNTER — Ambulatory Visit
Admission: RE | Admit: 2016-02-07 | Discharge: 2016-02-07 | Disposition: A | Discharge status: Home / Self Care | Payer: Medicare Other | Source: Ambulatory Visit | Attending: Neurosurgery | Admitting: Neurosurgery

## 2016-02-07 DIAGNOSIS — M545 Low back pain: Principal | ICD-10-CM

## 2016-02-07 DIAGNOSIS — G8929 Other chronic pain: Secondary | ICD-10-CM

## 2016-02-07 DIAGNOSIS — L02212 Cutaneous abscess of back [any part, except buttock]: Secondary | ICD-10-CM

## 2016-02-10 LAB — BODY FLUID CULTURE: GRAM STAIN: NONE SEEN

## 2016-02-13 DIAGNOSIS — T8859XA Other complications of anesthesia, initial encounter: Secondary | ICD-10-CM

## 2016-02-13 HISTORY — DX: Other complications of anesthesia, initial encounter: T88.59XA

## 2016-02-14 ENCOUNTER — Other Ambulatory Visit (HOSPITAL_COMMUNITY): Payer: Self-pay | Admitting: Neurosurgery

## 2016-02-14 DIAGNOSIS — T148XXA Other injury of unspecified body region, initial encounter: Principal | ICD-10-CM

## 2016-02-14 DIAGNOSIS — L089 Local infection of the skin and subcutaneous tissue, unspecified: Secondary | ICD-10-CM

## 2016-02-15 ENCOUNTER — Encounter (HOSPITAL_COMMUNITY): Payer: Self-pay

## 2016-02-15 ENCOUNTER — Ambulatory Visit (HOSPITAL_COMMUNITY)
Admission: RE | Admit: 2016-02-15 | Discharge: 2016-02-15 | Disposition: A | Discharge status: Home / Self Care | Payer: Medicare Other | Source: Ambulatory Visit | Attending: Neurosurgery | Admitting: Neurosurgery

## 2016-02-15 ENCOUNTER — Encounter: Payer: Self-pay | Admitting: Infectious Disease

## 2016-02-15 DIAGNOSIS — L719 Rosacea, unspecified: Secondary | ICD-10-CM | POA: Insufficient documentation

## 2016-02-15 DIAGNOSIS — Z885 Allergy status to narcotic agent status: Secondary | ICD-10-CM | POA: Insufficient documentation

## 2016-02-15 DIAGNOSIS — F419 Anxiety disorder, unspecified: Secondary | ICD-10-CM | POA: Diagnosis not present

## 2016-02-15 DIAGNOSIS — K219 Gastro-esophageal reflux disease without esophagitis: Secondary | ICD-10-CM | POA: Insufficient documentation

## 2016-02-15 DIAGNOSIS — I10 Essential (primary) hypertension: Secondary | ICD-10-CM | POA: Diagnosis not present

## 2016-02-15 DIAGNOSIS — Z79899 Other long term (current) drug therapy: Secondary | ICD-10-CM | POA: Insufficient documentation

## 2016-02-15 DIAGNOSIS — G4700 Insomnia, unspecified: Secondary | ICD-10-CM | POA: Insufficient documentation

## 2016-02-15 DIAGNOSIS — Z8639 Personal history of other endocrine, nutritional and metabolic disease: Secondary | ICD-10-CM | POA: Insufficient documentation

## 2016-02-15 DIAGNOSIS — Y838 Other surgical procedures as the cause of abnormal reaction of the patient, or of later complication, without mention of misadventure at the time of the procedure: Secondary | ICD-10-CM | POA: Insufficient documentation

## 2016-02-15 DIAGNOSIS — R3915 Urgency of urination: Secondary | ICD-10-CM | POA: Insufficient documentation

## 2016-02-15 DIAGNOSIS — Z880 Allergy status to penicillin: Secondary | ICD-10-CM | POA: Diagnosis not present

## 2016-02-15 DIAGNOSIS — Z794 Long term (current) use of insulin: Secondary | ICD-10-CM | POA: Diagnosis not present

## 2016-02-15 DIAGNOSIS — T148XXA Other injury of unspecified body region, initial encounter: Secondary | ICD-10-CM

## 2016-02-15 DIAGNOSIS — Z803 Family history of malignant neoplasm of breast: Secondary | ICD-10-CM | POA: Insufficient documentation

## 2016-02-15 DIAGNOSIS — R32 Unspecified urinary incontinence: Secondary | ICD-10-CM | POA: Insufficient documentation

## 2016-02-15 DIAGNOSIS — E039 Hypothyroidism, unspecified: Secondary | ICD-10-CM | POA: Diagnosis not present

## 2016-02-15 DIAGNOSIS — F329 Major depressive disorder, single episode, unspecified: Secondary | ICD-10-CM | POA: Insufficient documentation

## 2016-02-15 DIAGNOSIS — Z7982 Long term (current) use of aspirin: Secondary | ICD-10-CM | POA: Insufficient documentation

## 2016-02-15 DIAGNOSIS — E785 Hyperlipidemia, unspecified: Secondary | ICD-10-CM | POA: Insufficient documentation

## 2016-02-15 DIAGNOSIS — Z881 Allergy status to other antibiotic agents status: Secondary | ICD-10-CM | POA: Diagnosis not present

## 2016-02-15 DIAGNOSIS — L089 Local infection of the skin and subcutaneous tissue, unspecified: Secondary | ICD-10-CM | POA: Diagnosis not present

## 2016-02-15 DIAGNOSIS — T814XXA Infection following a procedure, initial encounter: Secondary | ICD-10-CM | POA: Insufficient documentation

## 2016-02-15 DIAGNOSIS — E1151 Type 2 diabetes mellitus with diabetic peripheral angiopathy without gangrene: Secondary | ICD-10-CM | POA: Insufficient documentation

## 2016-02-15 DIAGNOSIS — M1990 Unspecified osteoarthritis, unspecified site: Secondary | ICD-10-CM | POA: Insufficient documentation

## 2016-02-15 DIAGNOSIS — Z8249 Family history of ischemic heart disease and other diseases of the circulatory system: Secondary | ICD-10-CM | POA: Insufficient documentation

## 2016-02-15 DIAGNOSIS — G2581 Restless legs syndrome: Secondary | ICD-10-CM | POA: Insufficient documentation

## 2016-02-15 DIAGNOSIS — G4733 Obstructive sleep apnea (adult) (pediatric): Secondary | ICD-10-CM | POA: Insufficient documentation

## 2016-02-15 DIAGNOSIS — I2510 Atherosclerotic heart disease of native coronary artery without angina pectoris: Secondary | ICD-10-CM | POA: Insufficient documentation

## 2016-02-15 HISTORY — DX: Peripheral vascular disease, unspecified: I73.9

## 2016-02-15 HISTORY — DX: Urgency of urination: R39.15

## 2016-02-15 HISTORY — DX: Headache: R51

## 2016-02-15 HISTORY — DX: Unspecified urinary incontinence: R32

## 2016-02-15 HISTORY — DX: Anxiety disorder, unspecified: F41.9

## 2016-02-15 HISTORY — DX: Other bacterial infections of unspecified site: A49.8

## 2016-02-15 HISTORY — DX: Personal history of other diseases of the digestive system: Z87.19

## 2016-02-15 HISTORY — DX: Other specified cardiac arrhythmias: I49.8

## 2016-02-15 LAB — CBC WITH DIFFERENTIAL/PLATELET
BASOS ABS: 0 10*3/uL (ref 0.0–0.1)
BASOS PCT: 0 %
EOS ABS: 0.2 10*3/uL (ref 0.0–0.7)
Eosinophils Relative: 3 %
HEMATOCRIT: 35.4 % — AB (ref 36.0–46.0)
Hemoglobin: 11.1 g/dL — ABNORMAL LOW (ref 12.0–15.0)
Lymphocytes Relative: 36 %
Lymphs Abs: 2.4 10*3/uL (ref 0.7–4.0)
MCH: 26.1 pg (ref 26.0–34.0)
MCHC: 31.4 g/dL (ref 30.0–36.0)
MCV: 83.1 fL (ref 78.0–100.0)
MONO ABS: 0.3 10*3/uL (ref 0.1–1.0)
Monocytes Relative: 5 %
NEUTROS ABS: 3.8 10*3/uL (ref 1.7–7.7)
Neutrophils Relative %: 56 %
Platelets: 318 10*3/uL (ref 150–400)
RBC: 4.26 MIL/uL (ref 3.87–5.11)
RDW: 14.9 % (ref 11.5–15.5)
WBC: 6.7 10*3/uL (ref 4.0–10.5)

## 2016-02-15 LAB — GLUCOSE, CAPILLARY: GLUCOSE-CAPILLARY: 139 mg/dL — AB (ref 65–99)

## 2016-02-15 LAB — COMPREHENSIVE METABOLIC PANEL
ALT: 14 U/L (ref 14–54)
ANION GAP: 13 (ref 5–15)
AST: 21 U/L (ref 15–41)
Albumin: 3.9 g/dL (ref 3.5–5.0)
Alkaline Phosphatase: 117 U/L (ref 38–126)
BUN: 26 mg/dL — ABNORMAL HIGH (ref 6–20)
CHLORIDE: 101 mmol/L (ref 101–111)
CO2: 29 mmol/L (ref 22–32)
Calcium: 9.9 mg/dL (ref 8.9–10.3)
Creatinine, Ser: 0.99 mg/dL (ref 0.44–1.00)
GFR calc non Af Amer: 56 mL/min — ABNORMAL LOW (ref >60)
Glucose, Bld: 130 mg/dL — ABNORMAL HIGH (ref 65–99)
POTASSIUM: 4.7 mmol/L (ref 3.5–5.1)
SODIUM: 143 mmol/L (ref 135–145)
Total Bilirubin: 0.4 mg/dL (ref 0.3–1.2)
Total Protein: 7.2 g/dL (ref 6.5–8.1)

## 2016-02-15 LAB — PROTIME-INR
INR: 0.91
PROTHROMBIN TIME: 12.2 s (ref 11.4–15.2)

## 2016-02-15 MED ORDER — MIDAZOLAM HCL 2 MG/2ML IJ SOLN
INTRAMUSCULAR | Status: AC | PRN
  Administered 2016-02-15 (×4): 1 mg via INTRAVENOUS

## 2016-02-15 MED ORDER — FENTANYL CITRATE (PF) 100 MCG/2ML IJ SOLN
INTRAMUSCULAR | Status: AC | PRN
  Administered 2016-02-15: 25 ug via INTRAVENOUS
  Administered 2016-02-15: 50 ug via INTRAVENOUS
  Administered 2016-02-15: 25 ug via INTRAVENOUS

## 2016-02-15 MED ORDER — MIDAZOLAM HCL 2 MG/2ML IJ SOLN
INTRAMUSCULAR | Status: AC
  Filled 2016-02-15: qty 8

## 2016-02-15 MED ORDER — FENTANYL CITRATE (PF) 100 MCG/2ML IJ SOLN
INTRAMUSCULAR | Status: AC
  Filled 2016-02-15: qty 4

## 2016-02-15 MED ORDER — SODIUM CHLORIDE 0.9 % IV SOLN
INTRAVENOUS | Status: DC
  Administered 2016-02-15: 10:00:00 via INTRAVENOUS

## 2016-02-15 NOTE — Procedures (Signed)
CT-guided aspiration and drain placement in paraspinal fluid collection/abscess.  Removed 10 ml of bloody serous fluid.  8.5 French drain placed.  Attached to suction bulb.  No immediate complication.  Minimal blood loss.  Will send fluid for culture.

## 2016-02-15 NOTE — H&P (Signed)
Chief Complaint: Patient was seen in consultation today for perioperative lumbar spine abscess  Referring Physician(s):  Dr. Newman Pies  Supervising Physician: Markus Daft  Patient Status: [facility] - Out-pt  History of Present Illness: [name] is a 71 y.o. female with past medical history of sleep apnea, hypothyroidism, HTN, GERD, DM2, CAD, constipation, arthritis, and back pain is s/p  bilateral L3-4, L4-5, and L5-S1 laminectomy 11/14/15.  On 12/02/15 patient was admitted to [facility] with fever, headache, and wound drainage. Patient was treated for sepsis due to lumbar wound infection, underwent debridement of wound 10/20, and has remained on IV antibiotic through PICC line with Neurosurgery and Infectious Disease follow-up.   MRI Lumbar Spine 01/24/16: IMPRESSION: 1. Postoperative changes with posterior and interbody fusion hardware extending from L3-S1. 2. Sizable postoperative fluid collection in the laminectomy bed suspicious for abscess given the patient's history. 3. Surrounding inflammations/edema in the paraspinal muscles and subcutaneous tissues could be myositis and cellulitis. 4. No MR findings to suggest diskitis or osteomyelitis and no definite epidural abscess. 5. Stable degenerative disc disease at T11-12 and T12-L1.  In response to MRI findings, patient underwent lumbar disk aspiration 02/07/16.  Cultures returned showing ongoing E Coli infection. Since this aspiration of her lumbar spine she has had recurrence of the fluid collection.  IR consulted for lumbar spine drain placement at the request of Dr. Arnoldo Morale. Case reviewed by Dr. Corrie Mckusick who feels patient is appropriate for procedure.   Patient has been NPO.  She is not on blood thinners.  She complains of urinary incontinence today.    Past Medical History:  Diagnosis Date  . Anxiety   . Arthritis   . Chronic back pain    stenosis.degenerative disc,some scoliosis  . Constipation    takes Stool  Softener daily  . Coronary artery disease   . Depression    takes Cymbalta daily  . Diabetes mellitus    Type 2 diabetic. Average fasting blood sugar runs high 170-200  . Diverticulosis   . GERD (gastroesophageal reflux disease)    takes Nexium daily  . Headache   . Hemorrhoids   . History of colon polyps    benign  . History of gout    doesn't take any meds  . History of hiatal hernia   . History of vertigo    doesn't take any meds  . Hyperlipidemia    takes Praluent daily  . Hypertension    currently BP medications are on hold   . Hypothyroidism    takes Synthroid daily  . Insomnia    takes Restoril nightly  . Joint pain   . Joint swelling   . Muscle spasm    takes Robaxin as needed  . OSA on CPAP   . Periodic heart flutter   . Peripheral edema    takes LAsix as needed  . Peripheral vascular disease ([place])    AAA as stated per pt / was just discovered and pt states has not been referred to vascular MD   . Restless leg    takes Requip daily  . Rosacea   . Sleep apnea   . Urinary incontinence   . Varicose veins   . Weakness    numbness and tingling.    Past Surgical History:  Procedure Laterality Date  . ABDOMINAL HYSTERECTOMY     with BSo  . CARDIAC CATHETERIZATION  2013   Normal  . CARPAL TUNNEL RELEASE Bilateral   . CATARACT EXTRACTION, BILATERAL    .  CHOLECYSTECTOMY    . COLONOSCOPY    . DIAGNOSTIC LAPAROSCOPY     multiple times  . DILATION AND CURETTAGE OF UTERUS    . ESOPHAGOGASTRODUODENOSCOPY (EGD) WITH PROPOFOL N/A 11/01/2014   Procedure: ESOPHAGOGASTRODUODENOSCOPY (EGD) WITH PROPOFOL;  Surgeon: Hulen Luster, MD;  Location: [facility] ENDOSCOPY;  Service: Gastroenterology;  Laterality: N/A;  . IMPLANTABLE CONTACT LENS IMPLANTATION     bilateral  . LAMINECTOMY  11/13/2015  . LUMBAR WOUND DEBRIDEMENT N/A 12/02/2015   Procedure: WOUND Exploration;  Surgeon: Consuella Lose, MD;  Location: [place];  Service: Neurosurgery;  Laterality: N/A;  . ROTATOR CUFF  REPAIR Left   . SAVORY DILATION N/A 11/01/2014   Procedure: SAVORY DILATION;  Surgeon: Hulen Luster, MD;  Location: [facility] ENDOSCOPY;  Service: Gastroenterology;  Laterality: N/A;  . TONSILLECTOMY    . TRIGGER FINGER RELEASE Bilateral     Allergies: Ceftin [cefuroxime axetil]; Codeine; Penicillins; and Vicodin [hydrocodone-acetaminophen]  Medications: Prior to Admission medications   Medication Sig Start Date End Date Taking? Authorizing Provider  Alirocumab (PRALUENT) 150 MG/ML SOPN Inject 1 pen into the skin every 14 (fourteen) days. 01/12/16   Minna Merritts, MD  aspirin 81 MG tablet Take 81 mg by mouth daily.      Historical Provider, MD  cholecalciferol (VITAMIN D) 1000 UNITS tablet Take 1,000 Units by mouth daily.    Historical Provider, MD  collagenase (SANTYL) ointment Apply to affected area once per day until base of wound has beef-like appearance 01/18/16   Campbell Riches, MD  docusate sodium (COLACE) 100 MG capsule Take 100 mg by mouth daily.    Historical Provider, MD  DULoxetine (CYMBALTA) 60 MG capsule Take 60 mg by mouth daily.    Historical Provider, MD  esomeprazole (NEXIUM) 40 MG capsule Take 40 mg by mouth 3 (three) times daily.     Historical Provider, MD  estrogen-methylTESTOSTERone 0.625-1.25 MG per tablet Take 1 tablet by mouth every evening.     Historical Provider, MD  furosemide (LASIX) 20 MG tablet Take 20 mg by mouth daily.    Historical Provider, MD  gabapentin (NEURONTIN) 300 MG capsule Take 300 mg by mouth 4 (four) times daily.     Historical Provider, MD  hydrOXYzine (ATARAX/VISTARIL) 50 MG tablet Take 50 mg by mouth 2 (two) times daily.  05/26/12   Historical Provider, MD  insulin aspart (NOVOLOG) 100 UNIT/ML FlexPen Inject 10 Units into the skin See admin instructions. 10 units before breakfast, 10 units before lunch, 16 units at dinner, >200 increase by 2 units per sliding scale 03/01/15 02/29/16  Historical Provider, MD  insulin glargine (LANTUS) 100 UNIT/ML  injection Inject 32 Units into the skin daily. Take every morning    Historical Provider, MD  isosorbide mononitrate (IMDUR) 30 MG 24 hr tablet Take 30 mg by mouth as needed. 07/25/11 09/08/15  Minna Merritts, MD  LANTUS SOLOSTAR 100 UNIT/ML Solostar Pen 32 units in am 02/23/15   Historical Provider, MD  levothyroxine (SYNTHROID, LEVOTHROID) 75 MCG tablet Take 75 mcg by mouth daily before breakfast.  11/24/12   Historical Provider, MD  lisinopril (PRINIVIL,ZESTRIL) 20 MG tablet Take 1 tablet (20 mg total) by mouth daily. Patient taking differently: Take 10 mg by mouth 2 (two) times daily.  02/16/14   Minna Merritts, MD  meloxicam (MOBIC) 15 MG tablet Take 15 mg by mouth at bedtime.     Historical Provider, MD  methocarbamol (ROBAXIN) 750 MG tablet Take 750 mg by mouth 3 (three)  times daily as needed for muscle spasms.     Historical Provider, MD  metoprolol succinate (TOPROL XL) 25 MG 24 hr tablet Take 1 tablet (25 mg total) by mouth daily. 03/11/15   Minna Merritts, MD  nitroGLYCERIN (NITROSTAT) 0.4 MG SL tablet Place 1 tablet (0.4 mg total) under the tongue every 5 (five) minutes as needed for chest pain. 02/24/14 09/08/15  Minna Merritts, MD  propranolol (INDERAL) 20 MG tablet TAKE 1 TABLET THREE TIMES A DAY AS NEEDED 07/21/15   Minna Merritts, MD  ropinirole (REQUIP) 5 MG tablet Take 5 mg by mouth 2 (two) times daily.    Historical Provider, MD  sulfamethoxazole-trimethoprim (BACTRIM DS) 800-160 MG tablet Take 1 tablet by mouth 2 (two) times daily. 01/16/16   Truman Hayward, MD  tapentadol (NUCYNTA) 50 MG tablet Take 50 mg by mouth 3 (three) times daily as needed for moderate pain. Patient states she has to take this medication with food    Historical Provider, MD  Tapentadol HCl 100 MG TABS Take 1 tablet (100 mg total) by mouth every 4 (four) hours as needed for moderate pain. 11/18/15   Erline Levine, MD  temazepam (RESTORIL) 30 MG capsule Take 30 mg by mouth at bedtime.    Historical  Provider, MD  torsemide (DEMADEX) 20 MG tablet Take 20 mg by mouth See admin instructions. Take one tablet every third day. No specific days. Patient states she has not taken this medication in the last few days from today (12-02-15)    Historical Provider, MD  traMADol (ULTRAM) 50 MG tablet Take 50 mg by mouth 3 (three) times daily as needed for moderate pain.    Historical Provider, MD  traMADol (ULTRAM) 50 MG tablet Take 1 tablet (50 mg total) by mouth every 6 (six) hours as needed for moderate pain. 11/18/15   Erline Levine, MD     Family History  Problem Relation Age of Onset  . Heart disease Mother   . Breast cancer Mother 79  . Heart disease Father   . Heart attack Sister   . Heart disease Sister   . Heart disease Brother     Social History   Social History  . Marital status: Married    Spouse name: N/A  . Number of children: N/A  . Years of education: N/A   Social History Main Topics  . Smoking status: Never Smoker  . Smokeless tobacco: Never Used  . Alcohol use No  . Drug use: No  . Sexual activity: Not Asked   Other Topics Concern  . None   Social History Narrative  . None     Review of Systems  Constitutional: Positive for activity change. Negative for fever.  Respiratory: Negative for cough and shortness of breath.   Cardiovascular: Negative for chest pain.  Gastrointestinal: Negative for abdominal pain.  Genitourinary:       Incontinence  Musculoskeletal: Positive for back pain.  Psychiatric/Behavioral: Negative for behavioral problems and confusion.    Vital Signs: BP (!) 142/76 (BP Location: Left Arm)   Pulse 81   Temp 97.8 F (36.6 C) (Oral)   Resp 18   SpO2 100%   Physical Exam  Constitutional: She is oriented to person, place, and time. She appears well-developed.  Cardiovascular: Normal rate, regular rhythm and normal heart sounds.   Pulmonary/Chest: Effort normal and breath sounds normal. No respiratory distress.  Abdominal: Soft.    Musculoskeletal: She exhibits tenderness (lower back).  ROM  limited due to pain  Neurological: She is alert and oriented to person, place, and time.  Skin: Skin is warm and dry.  Psychiatric: She has a normal mood and affect. Her behavior is normal. Judgment and thought content normal.  Nursing note and vitals reviewed.   Mallampati Score:  MD Evaluation Airway: WNL Heart: WNL Abdomen: WNL Chest/ Lungs: WNL ASA  Classification: 3 Mallampati/Airway Score: Two  Imaging: Mr Lumbar Spine W Wo Contrast  Result Date: 01/24/2016 CLINICAL DATA:  Status post lumbar fusion 11/14/2015 with sepsis due to the coli. EXAM: MRI LUMBAR SPINE WITHOUT AND WITH CONTRAST TECHNIQUE: Multiplanar and multiecho pulse sequences of the lumbar spine were obtained without and with intravenous contrast. CONTRAST:  42mL MULTIHANCE GADOBENATE DIMEGLUMINE 529 MG/ML IV SOLN COMPARISON:  MRI lumbar spine 08/11/2015 FINDINGS: Segmentation: 5 lumbar type vertebral bodies with posterior and interbody fusion from L3 to S1. Alignment:  Normal Vertebrae: Grossly normal marrow signal despite significant artifact from the hardware. No obvious findings for diskitis or osteomyelitis. Conus medullaris: Extends to the L2 level and appears normal. Paraspinal and other soft tissues: Postoperative changes involving the paraspinal muscles and also the subcutaneous fat with moderate edema like signal intensity and enhancement. Postoperative fluid collection in the laminectomy bed extending from the L3-4 disc space down to the mid L5 level. This measures a maximum of 5.6 x 3.4 x 4.1 cm. It could be a liquified hematoma or abscess. No findings for epidural abscess, diskitis or osteomyelitis. Disc levels: Degenerative disc disease noted at T11-12 and T12-L1 with bulging discs. L1-2:  No significant findings. L2-3:  No significant findings. L3-4: Postoperative changes with wide decompressive laminectomy and posterior and interbody fusion changes.  No obvious spinal or foraminal stenosis. L4-5: Postoperative changes with wide decompressive laminectomy and posterior and interbody fusion changes. No obvious spinal or foraminal stenosis. Postoperative fluid collection as discussed above is centered at this level. L5-S1: Postoperative changes with wide decompressive laminectomy and posterior and interbody fusion changes. No spinal or foraminal stenosis. IMPRESSION: 1. Postoperative changes with posterior and interbody fusion hardware extending from L3-S1. 2. Sizable postoperative fluid collection in the laminectomy bed suspicious for abscess given the patient's history. 3. Surrounding inflammations/edema in the paraspinal muscles and subcutaneous tissues could be myositis and cellulitis. 4. No MR findings to suggest diskitis or osteomyelitis and no definite epidural abscess. 5. Stable degenerative disc disease at T11-12 and T12-L1. Electronically Signed   By: Marijo Sanes M.D.   On: 01/24/2016 10:12   Ct Biopsy  Result Date: 02/07/2016 CLINICAL DATA:  Low back pain. Lumbar spine fusion 11/14/2015. Postoperative infection. Postoperative fluid collection in the laminectomy bed on recent MRI with concern for abscess. EXAM: CT GUIDED ASPIRATION OF LUMBAR SPINE FLUID COLLECTION PROCEDURE: After a thorough discussion of risks and benefits of the procedure, including bleeding, infection, injury to nerves, blood vessels, and adjacent structures, verbal and written consent was obtained. The patient was placed prone on the CT table and localization was performed over the lumbar spine. Target site marked using CT guidance. The skin was prepped and draped in the usual sterile fashion using Betadine soap. After local anesthesia with 1% lidocaine without epinephrine and subsequent deep anesthesia, a 3.5 inch 18 gauge spinal needle was advanced into the lumbar laminectomy bed fluid collection at the L4-5 level right of midline under intermittent CT guidance. Aspiration  initially yielded 20 mL of yellow serous fluid. An additional 20 mL of old blood were also aspirated. Aspirates were sent to the laboratory  for the requested studies. No complications were observed. IMPRESSION: Successful CT-guided aspiration of lumbar spine fluid collection. Electronically Signed   By: Logan Bores M.D.   On: 02/07/2016 14:46    Labs:  CBC:  Recent Labs  11/04/15 1015 11/14/15 1433 11/15/15 0617 12/02/15 1919 02/15/16 0924  WBC 7.0  --  11.2* 9.4 6.7  HGB 13.5 11.9* 11.1* 10.4* 11.1*  HCT 42.2 35.0* 36.5 33.0* 35.4*  PLT 212  --  195 413* 318    COAGS:  Recent Labs  02/15/16 0924  INR 0.91    BMP:  Recent Labs  11/04/15 1015 11/14/15 1433 11/15/15 0617 12/02/15 1919 01/24/16 0837  NA 135 137 137 134*  --   K 4.2 4.7 4.3 3.9  --   CL 100*  --  100* 97*  --   CO2 28  --  31 26  --   GLUCOSE 274*  --  162* 230*  --   BUN 20  --  12 11  --   CALCIUM 9.3  --  8.5* 8.7*  --   CREATININE 1.10*  --  1.00 1.17* 0.90  GFRNONAA 50*  --  56* 46*  --   GFRAA 58*  --  >60 53*  --     LIVER FUNCTION TESTS:  Recent Labs  07/26/15 0802 12/02/15 1919  BILITOT 0.3 0.6  AST 17 14*  ALT 13 14  ALKPHOS 87 101  PROT 6.3 6.8  ALBUMIN 3.9 2.5*    TUMOR MARKERS: No results for input(s): AFPTM, CEA, CA199, CHROMGRNA in the last 8760 hours.  Assessment and Plan: Patient with history of low back pain s/p bilateral L3-4, L4-5, and L5-S1 laminectomy 11/14/15 now with persistent perioperative abscess of the laminectomy bed. Culture from lumbar aspiration 12/26 shows E coli infection.  Patient presents to IR today for CT-guided drain placement due to recurrent fluid collection related to abscess in the laminectomy bed. Patient has been NPO.  She is not on any blood thinners.  Risks and benefits discussed with the patient including bleeding, infection, damage to adjacent structures, and sepsis. All of the patient's questions were answered, patient is agreeable  to proceed. Consent signed and in chart.  Patient complains of 2 separate episodes of urinary incontinence at night over the past 2 weeks which is new for her.  She has bladder control with urgency during the day.  I have encouraged her to notify Neurosurgery and her PCP.   Thank you for this interesting consult.  I greatly enjoyed meeting [name] and look forward to participating in their care.  A copy of this report was sent to the requesting provider on this date.  Electronically Signed: Docia Barrier 02/15/2016, 10:03 AM   I spent a total of  30 Minutes   in face to face in clinical consultation, greater than 50% of which was counseling/coordinating care for perioperative lumbar spine abscess.

## 2016-02-15 NOTE — Discharge Instructions (Signed)
Surgical [facility] Introduction Surgical drains are used to remove extra fluid that normally builds up in a surgical wound after surgery. A surgical drain helps to heal a surgical wound. Different kinds of surgical drains include:  Active drains. These drains use suction to pull drainage away from the surgical wound. Drainage flows through a tube to a container outside of the body. It is important to keep the bulb or the drainage container flat (compressed) at all times, except while you empty it. Flattening the bulb or container creates suction. The two most common types of active drains are bulb drains and Hemovac drains.  Passive drains. These drains allow fluid to drain naturally, by gravity. Drainage flows through a tube to a bandage (dressing) or a container outside of the body. Passive drains do not need to be emptied. The most common type of passive drain is the Penrose drain. A drain is placed during surgery. Immediately after surgery, drainage is usually bright red and a little thicker than water. The drainage may gradually turn yellow or pink and become thinner. It is likely that your health care provider will remove the drain when the drainage stops or when the amount decreases to 1-2 Tbsp (15-30 mL) during a 24-hour period. How to care for your surgical drain  Keep the skin around the drain dry and covered with a dressing at all times.  Check your drain area every day for signs of infection. Check for:  More redness, swelling, or pain.  Pus or a bad smell.  Cloudy drainage. Follow instructions from your health care provider about how to take care of your drain and how to change your dressing. Change your dressing at least one time every day. Change it more often if needed to keep the dressing dry. Make sure you: 1. Gather your supplies, including:  Tape.  Germ-free cleaning solution (sterile saline).  Split gauze drain sponge: 4 x 4 inches (10 x 10 cm).  Gauze square: 4  x 4 inches (10 x 10 cm). 2. Wash your hands with soap and water before you change your dressing. If soap and water are not available, use hand sanitizer. 3. Remove the old dressing. Avoid using scissors to do that. 4. Use sterile saline to clean your skin around the drain. 5. Place the tube through the slit in a drain sponge. Place the drain sponge so that it covers your wound. 6. Place the gauze square or another drain sponge on top of the drain sponge that is on the wound. Make sure the tube is between those layers. 7. Tape the dressing to your skin. 8. If you have an active bulb or Hemovac drain, tape the drainage tube to your skin 1-2 inches (2.5-5 cm) below the place where the tube enters your body. Taping keeps the tube from pulling on any stitches (sutures) that you have. 9. Wash your hands with soap and water. 10. Write down the color of your drainage and how often you change your dressing. How to empty your active bulb or Hemovac drain 1. Make sure that you have a measuring cup that you can empty your drainage into. 2. Wash your hands with soap and water. If soap and water are not available, use hand sanitizer. 3. Gently move your fingers down the tube while squeezing very lightly. This is called stripping the tube. This clears any drainage, clots, or tissue from the tube.  Do not pull on the tube.  You may need to strip the  tube several times every day to keep the tube clear. 4. Open the bulb cap or the drain plug. Do not touch the inside of the cap or the bottom of the plug. 5. Empty all of the drainage into the measuring cup. 6. Compress the bulb or the container and replace the cap or the plug. To compress the bulb or the container, squeeze it firmly in the middle while you close the cap or plug the container. 7. Write down the amount of drainage that you have in each 24-hour period. If you have less than 2 Tbsp (30 mL) of drainage during 24 hours, contact your health care  provider. 8. Flush the drainage down the toilet. 9. Wash your hands with soap and water. Contact a health care provider if:  You have more redness, swelling, or pain around your drain area.  The amount of drainage that you have is increasing instead of decreasing.  You have pus or a bad smell coming from your drain area.  You have a fever.  You have drainage that is cloudy.  There is a sudden stop or a sudden decrease in the amount of drainage that you have.  Your tube falls out.  Your active draindoes not stay compressedafter you empty it. This information is not intended to replace advice given to you by your health care provider. Make sure you discuss any questions you have with your health care provider. Document Released: 01/27/2000 Document Revised: 07/07/2015 Document Reviewed: 08/18/2014  2017 Elsevier Moderate Conscious Sedation, Adult, Care After These instructions provide you with information about caring for yourself after your procedure. Your health care provider may also give you more specific instructions. Your treatment has been planned according to current medical practices, but problems sometimes occur. Call your health care provider if you have any problems or questions after your procedure. What can I expect after the procedure? After your procedure, it is common:  To feel sleepy for several hours.  To feel clumsy and have poor balance for several hours.  To have poor judgment for several hours.  To vomit if you eat too soon. Follow these instructions at home: For at least 24 hours after the procedure:   Do not:  Participate in activities where you could fall or become injured.  Drive.  Use heavy machinery.  Drink alcohol.  Take sleeping pills or medicines that cause drowsiness.  Make important decisions or sign legal documents.  Take care of children on your own.  Rest. Eating and drinking  Follow the diet recommended by your health  care provider.  If you vomit:  Drink water, juice, or soup when you can drink without vomiting.  Make sure you have little or no nausea before eating solid foods. General instructions  Have a responsible adult stay with you until you are awake and alert.  Take over-the-counter and prescription medicines only as told by your health care provider.  If you smoke, do not smoke without supervision.  Keep all follow-up visits as told by your health care provider. This is important. Contact a health care provider if:  You keep feeling nauseous or you keep vomiting.  You feel light-headed.  You develop a rash.  You have a fever. Get help right away if:  You have trouble breathing. This information is not intended to replace advice given to you by your health care provider. Make sure you discuss any questions you have with your health care provider. Document Released: 11/19/2012 Document Revised: 07/04/2015 Document  Reviewed: 05/21/2015 Elsevier Interactive Patient Education  2017 [employer].

## 2016-02-16 ENCOUNTER — Other Ambulatory Visit: Payer: Self-pay | Admitting: Cardiovascular Disease

## 2016-02-16 ENCOUNTER — Encounter: Payer: Self-pay | Admitting: Infectious Disease

## 2016-02-16 ENCOUNTER — Ambulatory Visit (INDEPENDENT_AMBULATORY_CARE_PROVIDER_SITE_OTHER): Payer: Medicare Other | Admitting: Infectious Disease

## 2016-02-16 VITALS — BP 139/80 | HR 91 | Temp 98.3°F | Ht 63.5 in | Wt 212.0 lb

## 2016-02-16 DIAGNOSIS — T814XXS Infection following a procedure, sequela: Secondary | ICD-10-CM | POA: Diagnosis not present

## 2016-02-16 DIAGNOSIS — A498 Other bacterial infections of unspecified site: Secondary | ICD-10-CM

## 2016-02-16 DIAGNOSIS — R51 Headache: Secondary | ICD-10-CM

## 2016-02-16 DIAGNOSIS — A4151 Sepsis due to Escherichia coli [E. coli]: Secondary | ICD-10-CM

## 2016-02-16 DIAGNOSIS — L089 Local infection of the skin and subcutaneous tissue, unspecified: Secondary | ICD-10-CM | POA: Diagnosis not present

## 2016-02-16 DIAGNOSIS — IMO0001 Reserved for inherently not codable concepts without codable children: Secondary | ICD-10-CM

## 2016-02-16 DIAGNOSIS — G4459 Other complicated headache syndrome: Secondary | ICD-10-CM

## 2016-02-16 DIAGNOSIS — T148XXA Other injury of unspecified body region, initial encounter: Secondary | ICD-10-CM

## 2016-02-16 DIAGNOSIS — R519 Headache, unspecified: Secondary | ICD-10-CM | POA: Insufficient documentation

## 2016-02-16 HISTORY — DX: Headache, unspecified: R51.9

## 2016-02-16 NOTE — Progress Notes (Signed)
HPI: [name] is a 71 y.o. female who presents to the [facility] today to follow-up with Dr. Tommy Medal for her chronic infection.   Allergies: Allergies  Allergen Reactions  . Ceftin [Cefuroxime Axetil] Diarrhea    diarrhea  . Codeine Rash       . Penicillins Rash    Has patient had a PCN reaction causing immediate rash, facial/tongue/throat swelling, SOB or lightheadedness with hypotension: YES Has patient had a PCN reaction causing severe rash involving mucus membranes or skin necrosis: NO Has patient had a PCN retioion that required hospitalization NO Has patient had a PCN reaction occurring within the last 10 years: YES If all of the above answers are "NO", then may proceed with Cephalosporin use.  . Vicodin [Hydrocodone-Acetaminophen] Rash     Also passes out    Past Medical History: Past Medical History:  Diagnosis Date  . Anxiety   . Arthritis   . Chronic back pain    stenosis.degenerative disc,some scoliosis  . Constipation    takes Stool Softener daily  . Coronary artery disease   . Depression    takes Cymbalta daily  . Diabetes mellitus    Type 2 diabetic. Average fasting blood sugar runs high 170-200  . Diverticulosis   . E coli infection   . GERD (gastroesophageal reflux disease)    takes Nexium daily  . Headache   . Hemorrhoids   . History of colon polyps    benign  . History of gout    doesn't take any meds  . History of hiatal hernia   . History of vertigo    doesn't take any meds  . Hyperlipidemia    takes Praluent daily  . Hypertension    currently BP medications are on hold   . Hypothyroidism    takes Synthroid daily  . Insomnia    takes Restoril nightly  . Joint pain   . Joint swelling   . Muscle spasm    takes Robaxin as needed  . OSA on CPAP   . Periodic heart flutter   . Peripheral edema    takes LAsix as needed  . Peripheral vascular disease ([place])    AAA as stated per pt / was just discovered and pt states has not been  referred to vascular MD   . Restless leg    takes Requip daily  . Rosacea   . Sleep apnea   . Urinary incontinence   . Urinary urgency   . Varicose veins   . Weakness    numbness and tingling.    Social History: Social History   Social History  . Marital status: Married    Spouse name: N/A  . Number of children: N/A  . Years of education: N/A   Social History Main Topics  . Smoking status: Never Smoker  . Smokeless tobacco: Never Used  . Alcohol use No  . Drug use: No  . Sexual activity: Not Asked   Other Topics Concern  . None   Social History Narrative  . None    Current Regimen: Ceftriaxon 2 gm IV qday  Labs: Hepatitis B Surface Ag (no units)  Date Value  12/04/2015 Negative    CrCl: Estimated Creatinine Clearance: 58.9 mL/min (by C-G formula based on SCr of 0.99 mg/dL).  Lipids:    Component Value Date/Time   CHOL 163 07/26/2015 0802   TRIG 187 (H) 07/26/2015 0802   HDL 32 (L) 07/26/2015 0802   CHOLHDL 5.1 (  H) 07/26/2015 0802   CHOLHDL 3.8 Ratio 06/10/2008 2232   VLDL 52 (H) 06/10/2008 2232   LDLCALC 94 07/26/2015 0802    Assessment/Plan: [name] is here for follow-up.  She has been on ceftriaxone for ~3 months.  After discussion with Dr. Tommy Medal, will increase the ceftriaxone to 2 gm q12h until next Thursday 1/11.  Will f/u with [name] next Tuesday and Wednesday to see how she is doing.  We may switch to oral then - Bactrim DS 2 tablet BID (high dose). Will f/u with patient.  [name] L. Westley Gambles, PharmD [facility]ious [facility] 02/16/2016, 2:52 PM

## 2016-02-16 NOTE — Progress Notes (Signed)
Subjective:  Chief complaint headaches, and drain not working properly  Patient ID: [name], female    DOB: [birth year], 71 y.o.   MRN: [id number]  HPI  71 y.o.femalewho underwent L3-S1 fusion on 10/2. She was discharged to SNF, and has been home for a few days. She was seen this  4 days pta with low-grade fevers and no significant drainage. Unfortunately, over the last 24 hours she has been having fever >101.5, developed severe HA , and has had yellow-brown drainage from her wound. When she was admitted there was concern for sepsis and BLOOD  cultures were drawn and she was started on vancomycin and Zosyn and she was taken the operating room where she underwent debridement and cultures of been taken which now are growing moderate Escherichia. He is placed on ceftriaxone by myself and has been on this antibiotic for more than 6 weeks. Her sedimentation rate is come down from 130 into the 60s but C-reactive protein is gone from 30s up to 55. Her back pain has largely improved but now she has some lower back pain with sciatic component radiation down her right thigh. I've ordered an MRI  Which showed a fluid collection present and which was sampled by IR and grew E coli yet again. He remained on the ceftriaxone but then had reaccumulation of fluid again and again underwent IR guided drainage a few days ago. That culture is still no growth to date. She has remained on the ceftriaxone. Her back pain has improved largely and actually improved most recently with drainage of the fluid was present. She does note also a headache she did not have before and which is alleviated by lying flat which makes me concerned that she might be suffering from a CSF leak.    Review of Systems  Constitutional: Negative for chills and fever.  HENT: Negative for congestion and sore throat.   Eyes: Negative for photophobia.  Respiratory: Negative for cough, shortness of breath and wheezing.   Cardiovascular: Negative  for chest pain, palpitations and leg swelling.  Gastrointestinal: Negative for abdominal pain, blood in stool, constipation, diarrhea, nausea and vomiting.  Genitourinary: Negative for dysuria, flank pain and hematuria.  Musculoskeletal: Positive for back pain. Negative for myalgias.  Skin: Positive for wound. Negative for rash.  Neurological: Positive for headaches. Negative for dizziness and weakness.  Hematological: Does not bruise/bleed easily.  Psychiatric/Behavioral: Negative for suicidal ideas.       Objective:   Physical Exam  Constitutional: She is oriented to person, place, and time. She appears well-developed and well-nourished. No distress.  HENT:  Head: Normocephalic and atraumatic.  Mouth/Throat: No oropharyngeal exudate.  Eyes: Conjunctivae and EOM are normal. No scleral icterus.  Neck: Normal range of motion. Neck supple.  Cardiovascular: Normal rate and regular rhythm.   Pulmonary/Chest: Effort normal. No respiratory distress. She has no wheezes.  Abdominal: She exhibits no distension.  Musculoskeletal: She exhibits no edema or tenderness.  Neurological: She is alert and oriented to person, place, and time. She exhibits normal muscle tone. Coordination normal.  Skin: Skin is warm and dry. No rash noted. She is not diaphoretic. No erythema. No pallor.  Psychiatric: She has a normal mood and affect. Her behavior is normal. Judgment and thought content normal.   PICC line 02/16/16 with small amount of erythema around the entrance point.        Postop wound 02/16/16        Assessment & Plan:   Post operative  infection that was treated with debridement of soft tissue but with concern for deep infection such as discitis, potential osteomyelitis with hardware involvement:  MRI has not Shown this but her inflammatory markers were fairly high originally did come down some since then. My concern at present is that she could potentially have a CSF leak.  We are going  to increase her ceftriaxone to 2 g every 12 hours and reassess how she is doing next week if there is no dramatic improvement I will get rid of her PICC line and place her on high-dose Bactrim twice daily.  Headaches: I have concern that she could have a CSF leak and that this potentially could be causing her headaches and also the fluid collections that are recurring. She will need weekly followed closely by neurosurgery. Certainly if she responds to higher dose ceftriaxone this would raise the concern for a postoperative spinal fluid leak that was infected.  We spent greater than 40 minutes with the patient including greater than 50% of time in face to face counsel of the patient guarded nature of her postoperative wound infection with Escherichia coli review of allergies review of her headaches or films and in coordination of her care with other treating physicians and advanced homecare.

## 2016-02-20 LAB — AEROBIC/ANAEROBIC CULTURE (SURGICAL/DEEP WOUND)

## 2016-02-20 LAB — AEROBIC/ANAEROBIC CULTURE W GRAM STAIN (SURGICAL/DEEP WOUND): Special Requests: NORMAL

## 2016-02-21 ENCOUNTER — Encounter: Payer: Self-pay | Admitting: Infectious Disease

## 2016-02-21 ENCOUNTER — Telehealth: Payer: Self-pay | Admitting: Pharmacist

## 2016-02-21 NOTE — Telephone Encounter (Signed)
Called and spoke to Triana about her IV Ceftriaxone.  She is feeling better today than she has in months.  She is able to do more and not sleeping or laying down as much.  She is not as dizzy either.  After discussing with Dr. Tommy Medal, will continue high dose Ceftriaxone 2 gm IV q12h x 1 month until 03/23/16.  Called Amy, PharmD at [facility], and gave verbal order. She verbalized understanding.

## 2016-03-06 ENCOUNTER — Encounter: Payer: Self-pay | Admitting: Cardiovascular Disease

## 2016-03-06 ENCOUNTER — Ambulatory Visit (INDEPENDENT_AMBULATORY_CARE_PROVIDER_SITE_OTHER): Payer: Medicare Other | Admitting: Cardiovascular Disease

## 2016-03-06 VITALS — BP 148/74 | HR 84 | Ht 63.0 in | Wt 214.0 lb

## 2016-03-06 DIAGNOSIS — R000 Tachycardia, unspecified: Secondary | ICD-10-CM

## 2016-03-06 DIAGNOSIS — E782 Mixed hyperlipidemia: Secondary | ICD-10-CM

## 2016-03-06 DIAGNOSIS — E1159 Type 2 diabetes mellitus with other circulatory complications: Secondary | ICD-10-CM

## 2016-03-06 DIAGNOSIS — G4733 Obstructive sleep apnea (adult) (pediatric): Secondary | ICD-10-CM

## 2016-03-06 DIAGNOSIS — I10 Essential (primary) hypertension: Secondary | ICD-10-CM

## 2016-03-06 DIAGNOSIS — R0602 Shortness of breath: Secondary | ICD-10-CM | POA: Diagnosis not present

## 2016-03-06 DIAGNOSIS — A4151 Sepsis due to Escherichia coli [E. coli]: Secondary | ICD-10-CM | POA: Diagnosis not present

## 2016-03-06 DIAGNOSIS — Z9989 Dependence on other enabling machines and devices: Secondary | ICD-10-CM

## 2016-03-06 DIAGNOSIS — I2510 Atherosclerotic heart disease of native coronary artery without angina pectoris: Secondary | ICD-10-CM

## 2016-03-06 DIAGNOSIS — Z794 Long term (current) use of insulin: Secondary | ICD-10-CM

## 2016-03-06 MED ORDER — TORSEMIDE 20 MG PO TABS: 20.0000 mg | ORAL_TABLET | ORAL | 3 refills | Status: DC

## 2016-03-06 MED ORDER — METOPROLOL SUCCINATE ER 25 MG PO TB24: 25.0000 mg | ORAL_TABLET | Freq: Every day | ORAL | 3 refills | Status: DC

## 2016-03-06 MED ORDER — PROPRANOLOL HCL 20 MG PO TABS: 20.0000 mg | ORAL_TABLET | Freq: Three times a day (TID) | ORAL | 3 refills | Status: DC | PRN

## 2016-03-06 MED ORDER — NITROGLYCERIN 0.4 MG SL SUBL: 0.4000 mg | SUBLINGUAL_TABLET | SUBLINGUAL | 3 refills | Status: DC | PRN

## 2016-03-06 NOTE — Patient Instructions (Signed)

## 2016-03-06 NOTE — Progress Notes (Signed)
Cardiology Office Note  Date:  03/06/2016   ID:  [name], DOB 1945/04/23, MRN [id number]  PCP:  Rusty Aus, MD   Chief Complaint  Patient presents with  . other    73mo f/u. Pt states she is doing well. Reviewed meds with pt verbally.    HPI:  71 year old woman with CAD, cardiac catheterization in 2009 and June 2013 showing moderate LAD, diagonal and RCA disease, Obesity, diabetes, hyperlipidemia with statin intolerance, obstructive sleep apnea on CPAP, Tachycardia episodes, previous leg edema, seen in the hospital for severe hypertension and chest pain, who presents for routine followup of her coronary artery disease  In follow-up today, she has had a difficult 4 months Had back surgery 11/14/2015 Went to rehab Got postop infection, ecoli, sepsis crp 30, sed rate 130 Temp in October 2017 Drain placed 02/15/2016, rare ecoli on ABX, picc line in place Still with significant back pain No fevers,  Insulin has been increased.  Weight down 223 down to 214 pounds Missed a few doses of praluent  Previous labs, On praluent, total cholesterol 163, ldl 94. Medication renewed for 1 year No regular exercise program, limited by joint pain  EKG on today's visit shows normal sinus rhythm with rate 84 bpm, no significant ST or T-wave changes   Other past medical history using her CPAP. Previous blood work before praluent, total cholesterol 238, triglycerides 462, hemoglobin A1c 7.0 She was unable to tolerate Vytorin as well as other statins  Admission on October 1, discharge on November 14 2010. She ruled out by cardiac enzymes, chest CT was negative for PE. She had a stress test that showed no ischemia. Her medications were adjusted. CT Scan did show fatty infiltration of the liver, granulomatous lesion of the left lower lobe  cardiac catheterization 07/25/2011 for chest pain  This showed left dominant coronary system with moderate mid LAD, proximal diagonal #1 and proximal  RCA disease all estimated at 50%, normal LV systolic function.  PMH:   has a past medical history of Anxiety; Arthritis; Chronic back pain; Constipation; Coronary artery disease; Depression; Diabetes mellitus; Diverticulosis; E coli infection; GERD (gastroesophageal reflux disease); Headache; Headache (02/16/2016); Hemorrhoids; History of colon polyps; History of gout; History of hiatal hernia; History of vertigo; Hyperlipidemia; Hypertension; Hypothyroidism; Insomnia; Joint pain; Joint swelling; Muscle spasm; OSA on CPAP; Periodic heart flutter; Peripheral edema; Peripheral vascular disease ([place]); Restless leg; Rosacea; Sleep apnea; Urinary incontinence; Urinary urgency; Varicose veins; and Weakness.  PSH:    Past Surgical History:  Procedure Laterality Date  . ABDOMINAL HYSTERECTOMY     with BSo  . CARDIAC CATHETERIZATION  2013   Normal  . CARPAL TUNNEL RELEASE Bilateral   . CATARACT EXTRACTION, BILATERAL    . CHOLECYSTECTOMY    . COLONOSCOPY    . DIAGNOSTIC LAPAROSCOPY     multiple times  . DILATION AND CURETTAGE OF UTERUS    . ESOPHAGOGASTRODUODENOSCOPY (EGD) WITH PROPOFOL N/A 11/01/2014   Procedure: ESOPHAGOGASTRODUODENOSCOPY (EGD) WITH PROPOFOL;  Surgeon: Hulen Luster, MD;  Location: [facility] ENDOSCOPY;  Service: Gastroenterology;  Laterality: N/A;  . IMPLANTABLE CONTACT LENS IMPLANTATION     bilateral  . LAMINECTOMY  11/13/2015  . LUMBAR WOUND DEBRIDEMENT N/A 12/02/2015   Procedure: WOUND Exploration;  Surgeon: Consuella Lose, MD;  Location: [facility];  Service: Neurosurgery;  Laterality: N/A;  . PICC LINE PLACE PERIPHERAL ([place] HX)     right upper arm   . ROTATOR CUFF REPAIR Left   . SAVORY DILATION N/A 11/01/2014  Procedure: SAVORY DILATION;  Surgeon: Hulen Luster, MD;  Location: [facility] ENDOSCOPY;  Service: Gastroenterology;  Laterality: N/A;  . TONSILLECTOMY    . TRIGGER FINGER RELEASE Bilateral     Current Outpatient Prescriptions  Medication Sig Dispense Refill  . Alirocumab  (PRALUENT) 150 MG/ML SOPN Inject 1 pen into the skin every 14 (fourteen) days. 6 pen 12  . aspirin 81 MG tablet Take 81 mg by mouth daily.      [name] azithromycin (ZITHROMAX) 500 MG tablet Take by mouth daily. Pt states completed prescription yesterday 02/14/2015    . CEFTIZOXIME SODIUM IV Inject 1 Syringe into the vein daily. 2 grams    . cholecalciferol (VITAMIN D) 1000 UNITS tablet Take 1,000 Units by mouth daily.    . DULoxetine (CYMBALTA) 60 MG capsule Take 60 mg by mouth daily.    [name] esomeprazole (NEXIUM) 40 MG capsule Take 40 mg by mouth 3 (three) times daily.     . furosemide (LASIX) 20 MG tablet Take 20 mg by mouth.     . gabapentin (NEURONTIN) 300 MG capsule Take 300 mg by mouth 4 (four) times daily.     . hydrOXYzine (ATARAX/VISTARIL) 50 MG tablet Take 50 mg by mouth 2 (two) times daily.     . insulin glargine (LANTUS) 100 UNIT/ML injection Inject 35 Units into the skin daily. Take every morning     . levothyroxine (SYNTHROID, LEVOTHROID) 75 MCG tablet Take 75 mcg by mouth daily before breakfast.     . lisinopril (PRINIVIL,ZESTRIL) 20 MG tablet Take 1 tablet (20 mg total) by mouth daily. (Patient taking differently: Take 10 mg by mouth 2 (two) times daily. ) 90 tablet 3  . meloxicam (MOBIC) 15 MG tablet Take 15 mg by mouth at bedtime.     . methocarbamol (ROBAXIN) 750 MG tablet Take 750 mg by mouth 3 (three) times daily as needed for muscle spasms.     . propranolol (INDERAL) 20 MG tablet TAKE 1 TABLET THREE TIMES A DAY AS NEEDED 180 tablet 3  . ropinirole (REQUIP) 5 MG tablet Take 5 mg by mouth 2 (two) times daily.    . tapentadol (NUCYNTA) 50 MG tablet Take 50 mg by mouth 3 (three) times daily as needed for moderate pain. Patient states she has to take this medication with food    . temazepam (RESTORIL) 30 MG capsule Take 30 mg by mouth at bedtime.    . TOPROL XL 25 MG 24 hr tablet TAKE 1 TABLET DAILY 90 tablet 3  . torsemide (DEMADEX) 20 MG tablet Take 20 mg by mouth See admin  instructions. Take one tablet every third day. No specific days. Patient states she has not taken this medication in the last few days from today (12-02-15)    . traMADol (ULTRAM) 50 MG tablet Take 50 mg by mouth 3 (three) times daily as needed for moderate pain.    [name] insulin aspart (NOVOLOG) 100 UNIT/ML FlexPen Inject 10 Units into the skin See admin instructions. 10 units before breakfast, 10 units before lunch, 16 units at dinner, >200 increase by 2 units per sliding scale    . isosorbide mononitrate (IMDUR) 30 MG 24 hr tablet Take 30 mg by mouth as needed.    . nitroGLYCERIN (NITROSTAT) 0.4 MG SL tablet Place 1 tablet (0.4 mg total) under the tongue every 5 (five) minutes as needed for chest pain. 25 tablet 3   No current facility-administered medications for this visit.      Allergies:  Ceftin [cefuroxime axetil]; Codeine; Penicillins; and Vicodin [hydrocodone-acetaminophen]   Social History:  The patient  reports that she has never smoked. She has never used smokeless tobacco. She reports that she does not drink alcohol or use drugs.   Family History:   family history includes Breast cancer (age of onset: 78) in her mother; Heart attack in her sister; Heart disease in her brother, father, mother, and sister.    Review of Systems: Review of Systems  Constitutional: Negative.   Respiratory: Negative.   Cardiovascular: Negative.   Gastrointestinal: Negative.   Musculoskeletal: Positive for back pain.  Neurological: Negative.   Psychiatric/Behavioral: Negative.   All other systems reviewed and are negative.    PHYSICAL EXAM: VS:  BP (!) 148/74 (BP Location: Left Arm, Patient Position: Sitting, Cuff Size: Normal)   Pulse 84   Ht 5\' 3"  (1.6 m)   Wt 214 lb (97.1 kg)   BMI 37.91 kg/m  , BMI Body mass index is 37.91 kg/m. GEN: Well nourished, well developed, in no acute distress, obese, laying back on table, in pain  HEENT: normal  Neck: no JVD, carotid bruits, or  masses Cardiac: RRR; no murmurs, rubs, or gallops,no edema  Respiratory:  clear to auscultation bilaterally, normal work of breathing GI: soft, nontender, nondistended, + BS MS: no deformity or atrophy  Skin: warm and dry, no rash Neuro:  Strength and sensation are intact Psych: euthymic mood, full affect    Recent Labs: 02/15/2016: ALT 14; BUN 26; Creatinine, Ser 0.99; Hemoglobin 11.1; Platelets 318; Potassium 4.7; Sodium 143    Lipid Panel Lab Results  Component Value Date   CHOL 163 07/26/2015   HDL 32 (L) 07/26/2015   LDLCALC 94 07/26/2015   TRIG 187 (H) 07/26/2015      Wt Readings from Last 3 Encounters:  03/06/16 214 lb (97.1 kg)  02/16/16 212 lb (96.2 kg)  02/15/16 207 lb (93.9 kg)       ASSESSMENT AND PLAN:  Coronary artery disease involving native coronary artery of native heart without angina pectoris - Plan: EKG 12-Lead Currently with no symptoms of angina. No further workup at this time. Continue current medication regimen.  Essential hypertension BP elevated, she is in pain. No changes made to the medications. Will monitor for now  Mixed hyperlipidemia On praluent 150 mg, every two weeks Dramatic improvement in her cholesterol numbers, will not recheck at this time given she did miss some doses  Tachycardia Denies any sx, no changes to meds.  DYSPNEA Stable, secondary to obesity, deconditioning Sedentary with back pain  Type 2 diabetes mellitus with other circulatory complication, with long-term current use of insulin (HCC) On insulin, weight down   OSA on CPAP Tolerated CPAP  Sepsis due to Escherichia coli (E. coli) ([place]) picc like, on ABX for one year Long discussion with her today concerning her concern of endocarditis Currently with no fevers, no further workup needed at this time She has long suppressive antibiotic therapy planned   Total encounter time more than 25 minutes  Greater than 50% was spent in counseling and coordination of  care with the patient   Disposition:   F/U  6 months   Orders Placed This Encounter  Procedures  . EKG 12-Lead     Signed, Esmond Plants, M.D., Ph.D. 03/06/2016  [place], [place]

## 2016-03-22 ENCOUNTER — Other Ambulatory Visit: Payer: Self-pay | Admitting: Neurosurgery

## 2016-03-22 DIAGNOSIS — T148XXA Other injury of unspecified body region, initial encounter: Principal | ICD-10-CM

## 2016-03-22 DIAGNOSIS — L089 Local infection of the skin and subcutaneous tissue, unspecified: Secondary | ICD-10-CM

## 2016-04-03 ENCOUNTER — Ambulatory Visit
Admission: RE | Admit: 2016-04-03 | Discharge: 2016-04-03 | Disposition: A | Discharge status: Home / Self Care | Payer: Medicare Other | Source: Ambulatory Visit | Attending: Neurosurgery | Admitting: Neurosurgery

## 2016-04-03 DIAGNOSIS — T148XXA Other injury of unspecified body region, initial encounter: Secondary | ICD-10-CM

## 2016-04-03 DIAGNOSIS — L089 Local infection of the skin and subcutaneous tissue, unspecified: Secondary | ICD-10-CM

## 2016-04-03 DIAGNOSIS — M5134 Other intervertebral disc degeneration, thoracic region: Secondary | ICD-10-CM | POA: Diagnosis not present

## 2016-04-03 DIAGNOSIS — Z8619 Personal history of other infectious and parasitic diseases: Secondary | ICD-10-CM | POA: Insufficient documentation

## 2016-04-03 DIAGNOSIS — Z9889 Other specified postprocedural states: Secondary | ICD-10-CM | POA: Insufficient documentation

## 2016-04-03 DIAGNOSIS — Z981 Arthrodesis status: Secondary | ICD-10-CM | POA: Insufficient documentation

## 2016-04-03 MED ORDER — GADOBENATE DIMEGLUMINE 529 MG/ML IV SOLN: 20.0000 mL | Freq: Once | INTRAVENOUS | Status: DC | PRN

## 2016-04-10 ENCOUNTER — Ambulatory Visit: Payer: Medicare Other | Admitting: Infectious Disease

## 2016-04-12 ENCOUNTER — Encounter: Payer: Self-pay | Admitting: Infectious Disease

## 2016-04-12 ENCOUNTER — Ambulatory Visit (INDEPENDENT_AMBULATORY_CARE_PROVIDER_SITE_OTHER): Payer: Medicare Other | Admitting: Infectious Disease

## 2016-04-12 VITALS — BP 137/76 | HR 81 | Temp 97.8°F | Ht 63.0 in | Wt 203.0 lb

## 2016-04-12 DIAGNOSIS — T148XXA Other injury of unspecified body region, initial encounter: Secondary | ICD-10-CM

## 2016-04-12 DIAGNOSIS — I2510 Atherosclerotic heart disease of native coronary artery without angina pectoris: Secondary | ICD-10-CM | POA: Diagnosis not present

## 2016-04-12 DIAGNOSIS — M5136 Other intervertebral disc degeneration, lumbar region: Secondary | ICD-10-CM | POA: Diagnosis not present

## 2016-04-12 DIAGNOSIS — T814XXS Infection following a procedure, sequela: Secondary | ICD-10-CM | POA: Diagnosis present

## 2016-04-12 DIAGNOSIS — M51369 Other intervertebral disc degeneration, lumbar region without mention of lumbar back pain or lower extremity pain: Secondary | ICD-10-CM

## 2016-04-12 DIAGNOSIS — A4151 Sepsis due to Escherichia coli [E. coli]: Secondary | ICD-10-CM

## 2016-04-12 DIAGNOSIS — L089 Local infection of the skin and subcutaneous tissue, unspecified: Secondary | ICD-10-CM | POA: Diagnosis not present

## 2016-04-12 DIAGNOSIS — IMO0001 Reserved for inherently not codable concepts without codable children: Secondary | ICD-10-CM

## 2016-04-12 LAB — BASIC METABOLIC PANEL WITH GFR
BUN: 24 mg/dL (ref 7–25)
CALCIUM: 9.4 mg/dL (ref 8.6–10.4)
CO2: 26 mmol/L (ref 20–31)
Chloride: 105 mmol/L (ref 98–110)
Creat: 1.18 mg/dL — ABNORMAL HIGH (ref 0.60–0.93)
GFR, EST AFRICAN AMERICAN: 54 mL/min — AB (ref >=60)
GFR, EST NON AFRICAN AMERICAN: 47 mL/min — AB (ref >=60)
Glucose, Bld: 138 mg/dL — ABNORMAL HIGH (ref 65–99)
POTASSIUM: 4.4 mmol/L (ref 3.5–5.3)
SODIUM: 140 mmol/L (ref 135–146)

## 2016-04-12 NOTE — Progress Notes (Signed)
Subjective:  Chief complaint: back aches   Patient ID: [name], female    DOB: 1945-02-25, 71 y.o.   MRN: [id number]  HPI  71 y.o.femalewho underwent L3-S1 fusion on 10/2. She was discharged to SNF, and has been home for a few days. She was seen this  4 days pta with low-grade fevers and no significant drainage. Unfortunately, over the last 24 hours she has been having fever >101.5, developed severe HA , and has had yellow-brown drainage from her wound. When she was admitted there was concern for sepsis and BLOOD  cultures were drawn and she was started on vancomycin and Zosyn and she was taken the operating room where she underwent debridement and cultures of been taken which now are growing moderate Escherichia. He is placed on ceftriaxone by myself and has been on this antibiotic for more than 6 weeks. Her sedimentation rate is come down from 130 into the 60s but C-reactive protein is gone from 30s up to 55. Her back pain has largely improved but now she has some lower back pain with sciatic component radiation down her right thigh. I've ordered an MRI  Which showed a fluid collection present and which was sampled by IR and grew E coli yet again. He remained on the ceftriaxone but then had reaccumulation of fluid again and again underwent IR guided drainage a few days ago. That culture is still no growth to date. She has remained on the ceftriaxone. Her back pain has improved largely and actually improved most recently with drainage of the fluid was present.  When I last saw her she did note also a headache she did not have before and which is alleviated by lying flat which makes me concerned that she might be suffering from a CSF leak.  I ended up increasing her CTX to 2g q 12 hours and she had imporovement with higher dose CTX so we extended this for another month before changing her to bactrim. In the inteirm she had bout of severe back pain 2-3 weeks ago and was seen by Neurosurgery  who ordered MRI spine which showed:  IMPRESSION: 1. No residual or recurrent abscess.  Stable postoperative changes. 2. Stable fusion hardware from N5-A2 without complicating features. No spinal or foraminal stenosis at the fused levels. 3. Stable degenerative disc disease at T11-12 and T12-L1 with bulging discs. 4. No significant findings at L1-2 or L2-3.  Her back pain is substantially improved in past week.   Past Medical History:  Diagnosis Date  . Anxiety   . Arthritis   . Chronic back pain    stenosis.degenerative disc,some scoliosis  . Constipation    takes Stool Softener daily  . Coronary artery disease   . Depression    takes Cymbalta daily  . Diabetes mellitus    Type 2 diabetic. Average fasting blood sugar runs high 170-200  . Diverticulosis   . E coli infection   . GERD (gastroesophageal reflux disease)    takes Nexium daily  . Headache   . Headache 02/16/2016  . Hemorrhoids   . History of colon polyps    benign  . History of gout    doesn't take any meds  . History of hiatal hernia   . History of vertigo    doesn't take any meds  . Hyperlipidemia    takes Praluent daily  . Hypertension    currently BP medications are on hold   . Hypothyroidism    takes  Synthroid daily  . Insomnia    takes Restoril nightly  . Joint pain   . Joint swelling   . Muscle spasm    takes Robaxin as needed  . OSA on CPAP   . Periodic heart flutter   . Peripheral edema    takes LAsix as needed  . Peripheral vascular disease ([place])    AAA as stated per pt / was just discovered and pt states has not been referred to vascular MD   . Restless leg    takes Requip daily  . Rosacea   . Sleep apnea   . Urinary incontinence   . Urinary urgency   . Varicose veins   . Weakness    numbness and tingling.    Past Surgical History:  Procedure Laterality Date  . ABDOMINAL HYSTERECTOMY     with BSo  . CARDIAC CATHETERIZATION  2013   Normal  . CARPAL TUNNEL RELEASE Bilateral     . CATARACT EXTRACTION, BILATERAL    . CHOLECYSTECTOMY    . COLONOSCOPY    . DIAGNOSTIC LAPAROSCOPY     multiple times  . DILATION AND CURETTAGE OF UTERUS    . ESOPHAGOGASTRODUODENOSCOPY (EGD) WITH PROPOFOL N/A 11/01/2014   Procedure: ESOPHAGOGASTRODUODENOSCOPY (EGD) WITH PROPOFOL;  Surgeon: Hulen Luster, MD;  Location: [facility] ENDOSCOPY;  Service: Gastroenterology;  Laterality: N/A;  . IMPLANTABLE CONTACT LENS IMPLANTATION     bilateral  . LAMINECTOMY  11/13/2015  . LUMBAR WOUND DEBRIDEMENT N/A 12/02/2015   Procedure: WOUND Exploration;  Surgeon: Consuella Lose, MD;  Location: [place];  Service: Neurosurgery;  Laterality: N/A;  . PICC LINE PLACE PERIPHERAL ([place] HX)     right upper arm   . ROTATOR CUFF REPAIR Left   . SAVORY DILATION N/A 11/01/2014   Procedure: SAVORY DILATION;  Surgeon: Hulen Luster, MD;  Location: [facility] ENDOSCOPY;  Service: Gastroenterology;  Laterality: N/A;  . TONSILLECTOMY    . TRIGGER FINGER RELEASE Bilateral     Family History  Problem Relation Age of Onset  . Heart disease Mother   . Breast cancer Mother 72  . Heart disease Father   . Heart attack Sister   . Heart disease Sister   . Heart disease Brother       Social History   Social History  . Marital status: Married    Spouse name: N/A  . Number of children: N/A  . Years of education: N/A   Social History Main Topics  . Smoking status: Never Smoker  . Smokeless tobacco: Never Used  . Alcohol use No  . Drug use: No  . Sexual activity: Not Asked   Other Topics Concern  . None   Social History Narrative  . None    Allergies  Allergen Reactions  . Ceftin [Cefuroxime Axetil] Diarrhea    diarrhea  . Codeine Rash       . Penicillins Rash    Has patient had a PCN reaction causing immediate rash, facial/tongue/throat swelling, SOB or lightheadedness with hypotension: YES Has patient had a PCN reaction causing severe rash involving mucus membranes or skin necrosis: NO Has patient had a PCN  retioion that required hospitalization NO Has patient had a PCN reaction occurring within the last 10 years: YES If all of the above answers are "NO", then may proceed with Cephalosporin use.  . Vicodin [Hydrocodone-Acetaminophen] Rash     Also passes out     Current Outpatient Prescriptions:  .  Alirocumab (PRALUENT) 150 MG/ML SOPN, Inject  1 pen into the skin every 14 (fourteen) days., Disp: 6 pen, Rfl: 12 .  aspirin 81 MG tablet, Take 81 mg by mouth daily.  , Disp: , Rfl:  .  cholecalciferol (VITAMIN D) 1000 UNITS tablet, Take 1,000 Units by mouth daily., Disp: , Rfl:  .  DULoxetine (CYMBALTA) 60 MG capsule, Take 60 mg by mouth daily., Disp: , Rfl:  .  esomeprazole (NEXIUM) 40 MG capsule, Take 40 mg by mouth 3 (three) times daily. , Disp: , Rfl:  .  furosemide (LASIX) 20 MG tablet, Take 20 mg by mouth. , Disp: , Rfl:  .  gabapentin (NEURONTIN) 300 MG capsule, Take 300 mg by mouth 4 (four) times daily. , Disp: , Rfl:  .  hydrOXYzine (ATARAX/VISTARIL) 50 MG tablet, Take 50 mg by mouth 2 (two) times daily. , Disp: , Rfl:  .  insulin glargine (LANTUS) 100 UNIT/ML injection, Inject 35 Units into the skin daily. Take every morning , Disp: , Rfl:  .  levothyroxine (SYNTHROID, LEVOTHROID) 75 MCG tablet, Take 75 mcg by mouth daily before breakfast. , Disp: , Rfl:  .  methocarbamol (ROBAXIN) 750 MG tablet, Take 750 mg by mouth 3 (three) times daily as needed for muscle spasms. , Disp: , Rfl:  .  metoprolol succinate (TOPROL XL) 25 MG 24 hr tablet, Take 1 tablet (25 mg total) by mouth daily., Disp: 90 tablet, Rfl: 3 .  nitroGLYCERIN (NITROSTAT) 0.4 MG SL tablet, Place 1 tablet (0.4 mg total) under the tongue every 5 (five) minutes as needed for chest pain., Disp: 25 tablet, Rfl: 3 .  propranolol (INDERAL) 20 MG tablet, Take 1 tablet (20 mg total) by mouth 3 (three) times daily as needed., Disp: 180 tablet, Rfl: 3 .  ropinirole (REQUIP) 5 MG tablet, Take 5 mg by mouth 2 (two) times daily., Disp: , Rfl:   .  sulfamethoxazole-trimethoprim (BACTRIM DS,SEPTRA DS) 800-160 MG tablet, Take 1 tablet by mouth 2 (two) times daily., Disp: , Rfl:  .  temazepam (RESTORIL) 30 MG capsule, Take 30 mg by mouth at bedtime., Disp: , Rfl:  .  traMADol (ULTRAM) 50 MG tablet, Take 50 mg by mouth 3 (three) times daily as needed for moderate pain., Disp: , Rfl:  .  insulin aspart (NOVOLOG) 100 UNIT/ML FlexPen, Inject 10 Units into the skin See admin instructions. 10 units before breakfast, 10 units before lunch, 16 units at dinner, >200 increase by 2 units per sliding scale, Disp: , Rfl:  .  isosorbide mononitrate (IMDUR) 30 MG 24 hr tablet, Take 30 mg by mouth as needed., Disp: , Rfl:  .  lisinopril (PRINIVIL,ZESTRIL) 20 MG tablet, Take 1 tablet (20 mg total) by mouth daily. (Patient not taking: Reported on 04/12/2016), Disp: 90 tablet, Rfl: 3      Review of Systems  Constitutional: Negative for chills and fever.  HENT: Negative for congestion and sore throat.   Eyes: Negative for photophobia.  Respiratory: Negative for cough, shortness of breath and wheezing.   Cardiovascular: Negative for chest pain, palpitations and leg swelling.  Gastrointestinal: Negative for abdominal pain, blood in stool, constipation, diarrhea, nausea and vomiting.  Genitourinary: Negative for dysuria, flank pain and hematuria.  Musculoskeletal: Positive for back pain. Negative for myalgias.  Skin: Positive for wound. Negative for rash.  Neurological: Negative for dizziness and weakness.  Hematological: Does not bruise/bleed easily.  Psychiatric/Behavioral: Negative for suicidal ideas.       Objective:   Physical Exam  Constitutional: She is oriented to  person, place, and time. She appears well-developed and well-nourished. No distress.  HENT:  Head: Normocephalic and atraumatic.  Mouth/Throat: No oropharyngeal exudate.  Eyes: Conjunctivae and EOM are normal. No scleral icterus.  Neck: Normal range of motion. Neck supple.    Cardiovascular: Normal rate and regular rhythm.   Pulmonary/Chest: Effort normal. No respiratory distress. She has no wheezes.  Abdominal: She exhibits no distension.  Musculoskeletal: She exhibits no edema or tenderness.  Neurological: She is alert and oriented to person, place, and time. She exhibits normal muscle tone. Coordination normal.  Skin: Skin is warm and dry. No rash noted. She is not diaphoretic. No erythema. No pallor.  Psychiatric: She has a normal mood and affect. Her behavior is normal. Judgment and thought content normal.     Postop wound 02/16/16        Assessment & Plan:   Post operative infection that was treated with debridement of soft tissue but with concern for deep infection such as discitis, potential osteomyelitis with hardware involvement:  MRI has not Shown this but her inflammatory markers were fairly high originally did come down some since then. My concern at present had been that she might have had  CSF leak. Indeed she improved on high dose CTX. Fortunately MRI done 10 days ago is very encouraring  I will recheck ESR and CRP and if also encouraging DC abx and have her followup with [place] in 4 months  I spent greater than 25 minutes with the patient including greater than 50% of time in face to face counsel of the patient re her back infection, review of her films independently and in coordination of her care.     We are going to increase her ceftriaxone to 2 g every 12 hours and reassess how she is doing next week if there is no dramatic improvement I will get rid of her PICC line and place her on high-dose Bactrim twice daily.  Headaches: I have concern that she could have a CSF leak and that this potentially could be causing her headaches and also the fluid collections that are recurring. She will need weekly followed closely by neurosurgery. Certainly if she responds to higher dose ceftriaxone this would raise the concern for a postoperative spinal  fluid leak that was infected.  We spent greater than 40 minutes with the patient including greater than 50% of time in face to face counsel of the patient guarded nature of her postoperative wound infection with Escherichia coli review of allergies review of her headaches or films and in coordination of her care with other treating physicians and advanced homecare.

## 2016-04-13 LAB — SEDIMENTATION RATE: SED RATE: 40 mm/h — AB (ref 0–30)

## 2016-04-13 LAB — C-REACTIVE PROTEIN: CRP: 8.6 mg/L — AB (ref <8.0)

## 2016-04-17 ENCOUNTER — Telehealth: Payer: Self-pay | Admitting: *Deleted

## 2016-04-17 NOTE — Telephone Encounter (Signed)
Patient notified. Akeyla Molden CMA  

## 2016-04-17 NOTE — Telephone Encounter (Signed)
-----  Message from Truman Hayward, MD sent at 04/13/2016  2:33 PM EST ----- HER ESR and CRP are down and her MRI looks great. She can stop her bactrim

## 2016-06-23 ENCOUNTER — Encounter: Payer: Self-pay | Admitting: Emergency Medicine

## 2016-06-23 DIAGNOSIS — K5900 Constipation, unspecified: Secondary | ICD-10-CM | POA: Insufficient documentation

## 2016-06-23 DIAGNOSIS — I2510 Atherosclerotic heart disease of native coronary artery without angina pectoris: Secondary | ICD-10-CM | POA: Diagnosis not present

## 2016-06-23 DIAGNOSIS — F419 Anxiety disorder, unspecified: Secondary | ICD-10-CM | POA: Insufficient documentation

## 2016-06-23 DIAGNOSIS — E785 Hyperlipidemia, unspecified: Secondary | ICD-10-CM | POA: Diagnosis not present

## 2016-06-23 DIAGNOSIS — Z79899 Other long term (current) drug therapy: Secondary | ICD-10-CM | POA: Diagnosis not present

## 2016-06-23 DIAGNOSIS — R1032 Left lower quadrant pain: Secondary | ICD-10-CM | POA: Diagnosis not present

## 2016-06-23 DIAGNOSIS — G4700 Insomnia, unspecified: Secondary | ICD-10-CM | POA: Diagnosis not present

## 2016-06-23 DIAGNOSIS — R197 Diarrhea, unspecified: Secondary | ICD-10-CM | POA: Insufficient documentation

## 2016-06-23 DIAGNOSIS — E1151 Type 2 diabetes mellitus with diabetic peripheral angiopathy without gangrene: Secondary | ICD-10-CM | POA: Insufficient documentation

## 2016-06-23 DIAGNOSIS — R600 Localized edema: Secondary | ICD-10-CM | POA: Insufficient documentation

## 2016-06-23 DIAGNOSIS — R112 Nausea with vomiting, unspecified: Secondary | ICD-10-CM | POA: Diagnosis not present

## 2016-06-23 DIAGNOSIS — Z7982 Long term (current) use of aspirin: Secondary | ICD-10-CM | POA: Insufficient documentation

## 2016-06-23 DIAGNOSIS — G4733 Obstructive sleep apnea (adult) (pediatric): Secondary | ICD-10-CM | POA: Diagnosis not present

## 2016-06-23 DIAGNOSIS — F329 Major depressive disorder, single episode, unspecified: Secondary | ICD-10-CM | POA: Insufficient documentation

## 2016-06-23 DIAGNOSIS — E039 Hypothyroidism, unspecified: Secondary | ICD-10-CM | POA: Diagnosis not present

## 2016-06-23 DIAGNOSIS — Z9989 Dependence on other enabling machines and devices: Secondary | ICD-10-CM | POA: Insufficient documentation

## 2016-06-23 DIAGNOSIS — Z794 Long term (current) use of insulin: Secondary | ICD-10-CM | POA: Diagnosis not present

## 2016-06-23 DIAGNOSIS — E860 Dehydration: Secondary | ICD-10-CM | POA: Diagnosis not present

## 2016-06-23 DIAGNOSIS — I700 Atherosclerosis of aorta: Secondary | ICD-10-CM | POA: Diagnosis not present

## 2016-06-23 DIAGNOSIS — R109 Unspecified abdominal pain: Secondary | ICD-10-CM | POA: Diagnosis present

## 2016-06-23 DIAGNOSIS — K219 Gastro-esophageal reflux disease without esophagitis: Secondary | ICD-10-CM | POA: Insufficient documentation

## 2016-06-23 DIAGNOSIS — N289 Disorder of kidney and ureter, unspecified: Secondary | ICD-10-CM | POA: Insufficient documentation

## 2016-06-23 DIAGNOSIS — I10 Essential (primary) hypertension: Secondary | ICD-10-CM | POA: Insufficient documentation

## 2016-06-23 DIAGNOSIS — G2581 Restless legs syndrome: Secondary | ICD-10-CM | POA: Diagnosis not present

## 2016-06-23 LAB — CBC
HCT: 37.6 % (ref 35.0–47.0)
Hemoglobin: 12.7 g/dL (ref 12.0–16.0)
MCH: 28.3 pg (ref 26.0–34.0)
MCHC: 33.7 g/dL (ref 32.0–36.0)
MCV: 83.8 fL (ref 80.0–100.0)
Platelets: 241 10*3/uL (ref 150–440)
RBC: 4.48 MIL/uL (ref 3.80–5.20)
RDW: 15.3 % — ABNORMAL HIGH (ref 11.5–14.5)
WBC: 9.2 10*3/uL (ref 3.6–11.0)

## 2016-06-23 LAB — GLUCOSE, CAPILLARY: Glucose-Capillary: 101 mg/dL — ABNORMAL HIGH (ref 65–99)

## 2016-06-23 MED ORDER — ONDANSETRON 4 MG PO TBDP
4.0000 mg | ORAL_TABLET | Freq: Once | ORAL | Status: AC | PRN
  Administered 2016-06-23: 4 mg via ORAL
  Filled 2016-06-23: qty 1

## 2016-06-23 NOTE — ED Triage Notes (Addendum)
Pt helped out of vehicle and into wheelchair due to pain. Pt c/o of N/V/D with LLQ abdominal pain x8 days. Pt was seen by PCP on Tuesday, diagnosed with diverticulitis, given shot of antibiotic, unknown per pt. Pt to ED today due to unresolved symptoms.

## 2016-06-24 ENCOUNTER — Emergency Department: Payer: Medicare Other

## 2016-06-24 ENCOUNTER — Observation Stay: Payer: Medicare Other

## 2016-06-24 ENCOUNTER — Observation Stay
Admission: EM | Admit: 2016-06-24 | Discharge: 2016-06-25 | Disposition: A | Discharge status: Home / Self Care | Payer: Medicare Other | Attending: Internal Medicine | Admitting: Internal Medicine

## 2016-06-24 ENCOUNTER — Encounter: Payer: Self-pay | Admitting: Radiology

## 2016-06-24 DIAGNOSIS — N3000 Acute cystitis without hematuria: Secondary | ICD-10-CM

## 2016-06-24 DIAGNOSIS — R109 Unspecified abdominal pain: Secondary | ICD-10-CM | POA: Diagnosis present

## 2016-06-24 DIAGNOSIS — N12 Tubulo-interstitial nephritis, not specified as acute or chronic: Secondary | ICD-10-CM

## 2016-06-24 DIAGNOSIS — R1032 Left lower quadrant pain: Secondary | ICD-10-CM

## 2016-06-24 DIAGNOSIS — N289 Disorder of kidney and ureter, unspecified: Secondary | ICD-10-CM

## 2016-06-24 DIAGNOSIS — R52 Pain, unspecified: Secondary | ICD-10-CM

## 2016-06-24 DIAGNOSIS — A084 Viral intestinal infection, unspecified: Secondary | ICD-10-CM

## 2016-06-24 DIAGNOSIS — R8281 Pyuria: Secondary | ICD-10-CM

## 2016-06-24 LAB — GASTROINTESTINAL PANEL BY PCR, STOOL (REPLACES STOOL CULTURE)

## 2016-06-24 LAB — URINALYSIS, COMPLETE (UACMP) WITH MICROSCOPIC
Bilirubin Urine: NEGATIVE
Glucose, UA: NEGATIVE mg/dL
Hgb urine dipstick: NEGATIVE
KETONES UR: NEGATIVE mg/dL
NITRITE: NEGATIVE
PROTEIN: NEGATIVE mg/dL
Specific Gravity, Urine: 1.01 (ref 1.005–1.030)
pH: 5 (ref 5.0–8.0)

## 2016-06-24 LAB — GLUCOSE, CAPILLARY
Glucose-Capillary: 90 mg/dL (ref 65–99)
Glucose-Capillary: 127 mg/dL — ABNORMAL HIGH (ref 65–99)
Glucose-Capillary: 113 mg/dL — ABNORMAL HIGH (ref 65–99)
Glucose-Capillary: 108 mg/dL — ABNORMAL HIGH (ref 65–99)

## 2016-06-24 LAB — COMPREHENSIVE METABOLIC PANEL
ALK PHOS: 112 U/L (ref 38–126)
ALT: 21 U/L (ref 14–54)
ANION GAP: 9 (ref 5–15)
AST: 22 U/L (ref 15–41)
Albumin: 3.8 g/dL (ref 3.5–5.0)
BUN: 32 mg/dL — ABNORMAL HIGH (ref 6–20)
CALCIUM: 9 mg/dL (ref 8.9–10.3)
CO2: 29 mmol/L (ref 22–32)
Chloride: 101 mmol/L (ref 101–111)
Creatinine, Ser: 1.43 mg/dL — ABNORMAL HIGH (ref 0.44–1.00)
GFR calc non Af Amer: 36 mL/min — ABNORMAL LOW (ref >60)
GFR, EST AFRICAN AMERICAN: 42 mL/min — AB (ref >60)
Glucose, Bld: 103 mg/dL — ABNORMAL HIGH (ref 65–99)
Potassium: 3.6 mmol/L (ref 3.5–5.1)
SODIUM: 139 mmol/L (ref 135–145)
Total Bilirubin: 0.5 mg/dL (ref 0.3–1.2)
Total Protein: 7.3 g/dL (ref 6.5–8.1)

## 2016-06-24 LAB — C DIFFICILE QUICK SCREEN W PCR REFLEX
C DIFFICILE (CDIFF) TOXIN: NEGATIVE
C Diff antigen: NEGATIVE
C Diff interpretation: NOT DETECTED

## 2016-06-24 LAB — LIPASE, BLOOD: LIPASE: 32 U/L (ref 11–51)

## 2016-06-24 MED ORDER — MORPHINE SULFATE (PF) 4 MG/ML IV SOLN
4.0000 mg | Freq: Once | INTRAVENOUS | Status: AC
  Administered 2016-06-24: 4 mg via INTRAVENOUS
  Filled 2016-06-24: qty 1

## 2016-06-24 MED ORDER — GABAPENTIN 300 MG PO CAPS
300.0000 mg | ORAL_CAPSULE | Freq: Four times a day (QID) | ORAL | Status: DC
  Administered 2016-06-24 – 2016-06-25 (×5): 300 mg via ORAL
  Filled 2016-06-24 (×5): qty 1

## 2016-06-24 MED ORDER — INSULIN ASPART 100 UNIT/ML ~~LOC~~ SOLN: 0.0000 [IU] | Freq: Every day | SUBCUTANEOUS | Status: DC

## 2016-06-24 MED ORDER — IOPAMIDOL (ISOVUE-300) INJECTION 61%
30.0000 mL | Freq: Once | INTRAVENOUS | Status: AC
  Administered 2016-06-24: 30 mL via ORAL

## 2016-06-24 MED ORDER — SODIUM CHLORIDE 0.9 % IV SOLN: INTRAVENOUS | Status: DC

## 2016-06-24 MED ORDER — DULOXETINE HCL 60 MG PO CPEP
60.0000 mg | ORAL_CAPSULE | Freq: Every day | ORAL | Status: DC
  Filled 2016-06-24 (×2): qty 1

## 2016-06-24 MED ORDER — TRAMADOL HCL 50 MG PO TABS: 50.0000 mg | ORAL_TABLET | Freq: Three times a day (TID) | ORAL | Status: DC | PRN

## 2016-06-24 MED ORDER — ASPIRIN EC 81 MG PO TBEC
81.0000 mg | DELAYED_RELEASE_TABLET | Freq: Every day | ORAL | Status: DC
  Administered 2016-06-24 – 2016-06-25 (×2): 81 mg via ORAL
  Filled 2016-06-24 (×2): qty 1

## 2016-06-24 MED ORDER — SENNOSIDES-DOCUSATE SODIUM 8.6-50 MG PO TABS
1.0000 | ORAL_TABLET | Freq: Every evening | ORAL | Status: DC | PRN
Start: 2016-06-24 — End: 2016-06-25

## 2016-06-24 MED ORDER — ONDANSETRON HCL 4 MG/2ML IJ SOLN
4.0000 mg | Freq: Four times a day (QID) | INTRAMUSCULAR | Status: DC | PRN
  Administered 2016-06-24 (×2): 4 mg via INTRAVENOUS
  Filled 2016-06-24 (×2): qty 2

## 2016-06-24 MED ORDER — LEVOFLOXACIN IN D5W 750 MG/150ML IV SOLN
750.0000 mg | INTRAVENOUS | Status: DC
  Administered 2016-06-24: 750 mg via INTRAVENOUS
  Filled 2016-06-24: qty 150

## 2016-06-24 MED ORDER — SODIUM CHLORIDE 0.9 % IV SOLN
INTRAVENOUS | Status: DC
  Administered 2016-06-24: 09:00:00 via INTRAVENOUS

## 2016-06-24 MED ORDER — LEVOTHYROXINE SODIUM 75 MCG PO TABS
75.0000 ug | ORAL_TABLET | Freq: Every day | ORAL | Status: DC
  Administered 2016-06-24 – 2016-06-25 (×2): 75 ug via ORAL
  Filled 2016-06-24 (×2): qty 1

## 2016-06-24 MED ORDER — NITROGLYCERIN 0.4 MG SL SUBL: 0.4000 mg | SUBLINGUAL_TABLET | SUBLINGUAL | Status: DC | PRN

## 2016-06-24 MED ORDER — METRONIDAZOLE IN NACL 5-0.79 MG/ML-% IV SOLN
500.0000 mg | Freq: Once | INTRAVENOUS | Status: AC
Start: 2016-06-24 — End: 2016-06-24
  Administered 2016-06-24: 500 mg via INTRAVENOUS
  Filled 2016-06-24: qty 100

## 2016-06-24 MED ORDER — ENOXAPARIN SODIUM 40 MG/0.4ML ~~LOC~~ SOLN
40.0000 mg | SUBCUTANEOUS | Status: DC
  Administered 2016-06-24 – 2016-06-25 (×2): 40 mg via SUBCUTANEOUS
  Filled 2016-06-24 (×2): qty 0.4

## 2016-06-24 MED ORDER — ACETAMINOPHEN 325 MG PO TABS
650.0000 mg | ORAL_TABLET | Freq: Four times a day (QID) | ORAL | Status: DC | PRN
  Administered 2016-06-25: 650 mg via ORAL
  Filled 2016-06-24: qty 2

## 2016-06-24 MED ORDER — INSULIN ASPART 100 UNIT/ML ~~LOC~~ SOLN
0.0000 [IU] | Freq: Three times a day (TID) | SUBCUTANEOUS | Status: DC
  Administered 2016-06-24: 2 [IU] via SUBCUTANEOUS
  Administered 2016-06-25: 3 [IU] via SUBCUTANEOUS
  Filled 2016-06-24: qty 2
  Filled 2016-06-24: qty 3

## 2016-06-24 MED ORDER — ACETAMINOPHEN 650 MG RE SUPP: 650.0000 mg | Freq: Four times a day (QID) | RECTAL | Status: DC | PRN

## 2016-06-24 MED ORDER — METOPROLOL SUCCINATE ER 25 MG PO TB24
25.0000 mg | ORAL_TABLET | Freq: Every day | ORAL | Status: DC
  Administered 2016-06-24: 25 mg via ORAL
  Filled 2016-06-24: qty 1

## 2016-06-24 MED ORDER — TEMAZEPAM 15 MG PO CAPS
30.0000 mg | ORAL_CAPSULE | Freq: Every day | ORAL | Status: DC
  Administered 2016-06-24: 30 mg via ORAL
  Filled 2016-06-24: qty 2

## 2016-06-24 MED ORDER — PANTOPRAZOLE SODIUM 40 MG PO TBEC
40.0000 mg | DELAYED_RELEASE_TABLET | Freq: Every day | ORAL | Status: DC
  Administered 2016-06-24 – 2016-06-25 (×2): 40 mg via ORAL
  Filled 2016-06-24 (×2): qty 1

## 2016-06-24 MED ORDER — CIPROFLOXACIN IN D5W 400 MG/200ML IV SOLN
400.0000 mg | Freq: Once | INTRAVENOUS | Status: AC
  Administered 2016-06-24: 400 mg via INTRAVENOUS
  Filled 2016-06-24: qty 200

## 2016-06-24 MED ORDER — ONDANSETRON HCL 4 MG/2ML IJ SOLN
4.0000 mg | Freq: Once | INTRAMUSCULAR | Status: AC
  Administered 2016-06-24: 4 mg via INTRAVENOUS
  Filled 2016-06-24: qty 2

## 2016-06-24 MED ORDER — SODIUM CHLORIDE 0.9 % IV SOLN
INTRAVENOUS | Status: DC
  Administered 2016-06-24 – 2016-06-25 (×3): via INTRAVENOUS

## 2016-06-24 MED ORDER — ONDANSETRON HCL 4 MG PO TABS: 4.0000 mg | ORAL_TABLET | Freq: Four times a day (QID) | ORAL | Status: DC | PRN

## 2016-06-24 MED ORDER — SODIUM CHLORIDE 0.9 % IV BOLUS (SEPSIS)
1000.0000 mL | INTRAVENOUS | Status: DC | PRN
  Administered 2016-06-24: 1000 mL via INTRAVENOUS

## 2016-06-24 MED ORDER — PROPRANOLOL HCL 20 MG PO TABS
20.0000 mg | ORAL_TABLET | Freq: Two times a day (BID) | ORAL | Status: DC
  Administered 2016-06-24: 20 mg via ORAL
  Filled 2016-06-24 (×2): qty 1

## 2016-06-24 MED ORDER — IOPAMIDOL (ISOVUE-300) INJECTION 61%
75.0000 mL | Freq: Once | INTRAVENOUS | Status: AC | PRN
  Administered 2016-06-24: 75 mL via INTRAVENOUS

## 2016-06-24 MED ORDER — ROPINIROLE HCL 1 MG PO TABS
5.0000 mg | ORAL_TABLET | Freq: Two times a day (BID) | ORAL | Status: DC
  Administered 2016-06-24 – 2016-06-25 (×3): 5 mg via ORAL
  Filled 2016-06-24 (×3): qty 5

## 2016-06-24 NOTE — ED Notes (Signed)
Pt updated on delay. Pt verbalizes understanding.  

## 2016-06-24 NOTE — ED Notes (Signed)
MD at bedside. 

## 2016-06-24 NOTE — ED Notes (Signed)
Pt assisted to ambulate to the commode and back to stretcher.

## 2016-06-24 NOTE — ED Notes (Signed)
Pharmacy called; Pyxis out of stock of 4mg /ml Morphine.

## 2016-06-24 NOTE — H&P (Signed)
[facility] [place] NAME: [name]    MR#:  [id number]  DATE OF BIRTH:  [birth year]  DATE OF ADMISSION:  06/24/2016  PRIMARY CARE PHYSICIAN: Rusty Aus, MD   REQUESTING/REFERRING PHYSICIAN:   CHIEF COMPLAINT:   Chief Complaint  Patient presents with  . Abdominal Pain  . Emesis  . Fever    HISTORY OF PRESENT ILLNESS: [name]  is a 71 y.o. female with a known history of Arthritis, anxiety disorder, chronic back pain, diabetes mellitus, diverticulosis, Escherichia coli infection in the past presented to the emergency room with abdominal pain since 8 days. Patient also says she had some fever and chills. No complaints of any diarrhea or any rectal bleed. She was taking oral antibiotics for diverticulitis recently. Patient has some dysuria. Patient was worked up with CT abdomen in the emergency room which did not reveal any acute pathology. Her urine showed infection. She also has some nausea and vomiting and abdominal pain was severe. Abdominal pain was aching in nature located in the left lower quadrant 7 out of 10 on a scale of 1-10. Hospitalist service was consulted for further care of the patient.  PAST MEDICAL HISTORY:   Past Medical History:  Diagnosis Date  . Anxiety   . Arthritis   . Chronic back pain    stenosis.degenerative disc,some scoliosis  . Constipation    takes Stool Softener daily  . Coronary artery disease   . Depression    takes Cymbalta daily  . Diabetes mellitus    Type 2 diabetic. Average fasting blood sugar runs high 170-200  . Diverticulosis   . E coli infection   . GERD (gastroesophageal reflux disease)    takes Nexium daily  . Headache   . Headache 02/16/2016  . Hemorrhoids   . History of colon polyps    benign  . History of gout    doesn't take any meds  . History of hiatal hernia   . History of vertigo    doesn't take any meds  . Hyperlipidemia    takes Praluent daily  .  Hypertension    currently BP medications are on hold   . Hypothyroidism    takes Synthroid daily  . Insomnia    takes Restoril nightly  . Joint pain   . Joint swelling   . Muscle spasm    takes Robaxin as needed  . OSA on CPAP   . Periodic heart flutter   . Peripheral edema    takes LAsix as needed  . Peripheral vascular disease ([place])    AAA as stated per pt / was just discovered and pt states has not been referred to vascular MD   . Restless leg    takes Requip daily  . Rosacea   . Sleep apnea   . Urinary incontinence   . Urinary urgency   . Varicose veins   . Weakness    numbness and tingling.    PAST SURGICAL HISTORY: Past Surgical History:  Procedure Laterality Date  . ABDOMINAL HYSTERECTOMY     with BSo  . CARDIAC CATHETERIZATION  2013   Normal  . CARPAL TUNNEL RELEASE Bilateral   . CATARACT EXTRACTION, BILATERAL    . CHOLECYSTECTOMY    . COLONOSCOPY    . DIAGNOSTIC LAPAROSCOPY     multiple times  . DILATION AND CURETTAGE OF UTERUS    . ESOPHAGOGASTRODUODENOSCOPY (EGD) WITH PROPOFOL N/A 11/01/2014   Procedure: ESOPHAGOGASTRODUODENOSCOPY (  EGD) WITH PROPOFOL;  Surgeon: Hulen Luster, MD;  Location: [facility] ENDOSCOPY;  Service: Gastroenterology;  Laterality: N/A;  . IMPLANTABLE CONTACT LENS IMPLANTATION     bilateral  . LAMINECTOMY  11/13/2015  . LUMBAR WOUND DEBRIDEMENT N/A 12/02/2015   Procedure: WOUND Exploration;  Surgeon: Consuella Lose, MD;  Location: [facility];  Service: Neurosurgery;  Laterality: N/A;  . PICC LINE PLACE PERIPHERAL ([place] HX)     right upper arm   . ROTATOR CUFF REPAIR Left   . SAVORY DILATION N/A 11/01/2014   Procedure: SAVORY DILATION;  Surgeon: Hulen Luster, MD;  Location: [facility] ENDOSCOPY;  Service: Gastroenterology;  Laterality: N/A;  . TONSILLECTOMY    . TRIGGER FINGER RELEASE Bilateral     SOCIAL HISTORY:  Social History  Substance Use Topics  . Smoking status: Never Smoker  . Smokeless tobacco: Never Used  . Alcohol use No    FAMILY  HISTORY:  Family History  Problem Relation Age of Onset  . Heart disease Mother   . Breast cancer Mother 16  . Heart disease Father   . Heart attack Sister   . Heart disease Sister   . Heart disease Brother     DRUG ALLERGIES:  Allergies  Allergen Reactions  . Ceftin [Cefuroxime Axetil] Diarrhea    diarrhea  . Codeine Rash       . Penicillins Rash    Has patient had a PCN reaction causing immediate rash, facial/tongue/throat swelling, SOB or lightheadedness with hypotension: YES Has patient had a PCN reaction causing severe rash involving mucus membranes or skin necrosis: NO Has patient had a PCN retioion that required hospitalization NO Has patient had a PCN reaction occurring within the last 10 years: YES If all of the above answers are "NO", then may proceed with Cephalosporin use.  . Vicodin [Hydrocodone-Acetaminophen] Rash     Also passes out    REVIEW OF SYSTEMS:   CONSTITUTIONAL: No fever, fatigue or weakness.  EYES: No blurred or double vision.  EARS, NOSE, AND THROAT: No tinnitus or ear pain.  RESPIRATORY: No cough, shortness of breath, wheezing or hemoptysis.  CARDIOVASCULAR: No chest pain, orthopnea, edema.  GASTROINTESTINAL: Has nausea, vomiting, abdominal pain. No diarrhea.  GENITOURINARY: Has dysuria, no hematuria.  ENDOCRINE: No polyuria, nocturia,  HEMATOLOGY: No anemia, easy bruising or bleeding SKIN: No rash or lesion. MUSCULOSKELETAL: No joint pain or arthritis.   NEUROLOGIC: No tingling, numbness, weakness.  PSYCHIATRY: No anxiety or depression.   MEDICATIONS AT HOME:  Prior to Admission medications   Medication Sig Start Date End Date Taking? Authorizing Provider  Alirocumab (PRALUENT) 150 MG/ML SOPN Inject 1 pen into the skin every 14 (fourteen) days. 01/12/16   Minna Merritts, MD  aspirin 81 MG tablet Take 81 mg by mouth daily.      [provider]  cholecalciferol (VITAMIN D) 1000 UNITS tablet Take 1,000 Units by mouth daily.     [provider]  DULoxetine (CYMBALTA) 60 MG capsule Take 60 mg by mouth daily.    [provider]  esomeprazole (NEXIUM) 40 MG capsule Take 40 mg by mouth 3 (three) times daily.     [provider]  furosemide (LASIX) 20 MG tablet Take 20 mg by mouth.     [provider]  gabapentin (NEURONTIN) 300 MG capsule Take 300 mg by mouth 4 (four) times daily.     [provider]  hydrOXYzine (ATARAX/VISTARIL) 50 MG tablet Take 50 mg by mouth 2 (two) times daily.  05/26/12   [provider]  insulin aspart (NOVOLOG) 100 UNIT/ML FlexPen Inject 10 Units into the skin See admin instructions. 10 units before breakfast, 10 units before lunch, 16 units at dinner, >200 increase by 2 units per sliding scale 03/01/15 02/29/16  [provider]  insulin glargine (LANTUS) 100 UNIT/ML injection Inject 35 Units into the skin daily. Take every morning     [provider]  isosorbide mononitrate (IMDUR) 30 MG 24 hr tablet Take 30 mg by mouth as needed. 07/25/11 09/08/15  Minna Merritts, MD  levothyroxine (SYNTHROID, LEVOTHROID) 75 MCG tablet Take 75 mcg by mouth daily before breakfast.  11/24/12   [provider]  lisinopril (PRINIVIL,ZESTRIL) 20 MG tablet Take 1 tablet (20 mg total) by mouth daily. Patient not taking: Reported on 04/12/2016 02/16/14   Minna Merritts, MD  methocarbamol (ROBAXIN) 750 MG tablet Take 750 mg by mouth 3 (three) times daily as needed for muscle spasms.     [provider]  metoprolol succinate (TOPROL XL) 25 MG 24 hr tablet Take 1 tablet (25 mg total) by mouth daily. 03/06/16   Minna Merritts, MD  nitroGLYCERIN (NITROSTAT) 0.4 MG SL tablet Place 1 tablet (0.4 mg total) under the tongue every 5 (five) minutes as needed for chest pain. 03/06/16 09/18/17  Minna Merritts, MD  propranolol (INDERAL) 20 MG tablet Take 1 tablet (20 mg total) by mouth 3 (three) times daily as needed. 03/06/16   Minna Merritts, MD   ropinirole (REQUIP) 5 MG tablet Take 5 mg by mouth 2 (two) times daily.    [provider]  sulfamethoxazole-trimethoprim (BACTRIM DS,SEPTRA DS) 800-160 MG tablet Take 1 tablet by mouth 2 (two) times daily.    [provider]  temazepam (RESTORIL) 30 MG capsule Take 30 mg by mouth at bedtime.    [provider]  traMADol (ULTRAM) 50 MG tablet Take 50 mg by mouth 3 (three) times daily as needed for moderate pain.    [provider]      PHYSICAL EXAMINATION:   VITAL SIGNS: Blood pressure (!) 142/66, pulse 93, temperature 97.7 F (36.5 C), temperature source Oral, resp. rate 13, weight 92.1 kg (203 lb), SpO2 98 %.  GENERAL:  71 y.o.-year-old patient lying in the bed with no acute distress.  EYES: Pupils equal, round, reactive to light and accommodation. No scleral icterus. Extraocular muscles intact.  HEENT: Head atraumatic, normocephalic. Oropharynx dry and nasopharynx clear.  NECK:  Supple, no jugular venous distention. No thyroid enlargement, no tenderness.  LUNGS: Normal breath sounds bilaterally, no wheezing, rales,rhonchi or crepitation. No use of accessory muscles of respiration.  CARDIOVASCULAR: S1, S2 normal. No murmurs, rubs, or gallops.  ABDOMEN: Soft, tenderness present left lower quadrant, nondistended. Bowel sounds present. No organomegaly or mass.  EXTREMITIES: No pedal edema, cyanosis, or clubbing.  NEUROLOGIC: Cranial nerves II through XII are intact. Muscle strength 5/5 in all extremities. Sensation intact. Gait not checked.  PSYCHIATRIC: The patient is alert and oriented x 3.  SKIN: No obvious rash, lesion, or ulcer.   LABORATORY PANEL:   CBC  Recent Labs Lab 06/23/16 2344  WBC 9.2  HGB 12.7  HCT 37.6  PLT 241  MCV 83.8  MCH 28.3  MCHC 33.7  RDW 15.3*   ------------------------------------------------------------------------------------------------------------------  Chemistries   Recent Labs Lab 06/23/16 2344  NA  139  K 3.6  CL 101  CO2 29  GLUCOSE 103*  BUN 32*  CREATININE 1.43*  CALCIUM 9.0  AST 22  ALT 21  ALKPHOS 112  BILITOT 0.5   ------------------------------------------------------------------------------------------------------------------ estimated creatinine clearance is 38.9 mL/min (A) (by C-G formula based on SCr of 1.43 mg/dL (H)). ------------------------------------------------------------------------------------------------------------------ No results for input(s): TSH, T4TOTAL, T3FREE, THYROIDAB in the last 72 hours.  Invalid input(s): FREET3   Coagulation profile No results for input(s): INR, PROTIME in the last 168 hours. ------------------------------------------------------------------------------------------------------------------- No results for input(s): DDIMER in the last 72 hours. -------------------------------------------------------------------------------------------------------------------  Cardiac Enzymes No results for input(s): CKMB, TROPONINI, MYOGLOBIN in the last 168 hours.  Invalid input(s): CK ------------------------------------------------------------------------------------------------------------------ Invalid input(s): POCBNP  ---------------------------------------------------------------------------------------------------------------  Urinalysis    Component Value Date/Time   COLORURINE YELLOW (A) 06/23/2016 2344   APPEARANCEUR CLOUDY (A) 06/23/2016 2344   LABSPEC 1.010 06/23/2016 2344   PHURINE 5.0 06/23/2016 2344   GLUCOSEU NEGATIVE 06/23/2016 2344   HGBUR NEGATIVE 06/23/2016 2344   BILIRUBINUR NEGATIVE 06/23/2016 2344   KETONESUR NEGATIVE 06/23/2016 2344   PROTEINUR NEGATIVE 06/23/2016 2344   NITRITE NEGATIVE 06/23/2016 2344   LEUKOCYTESUR LARGE (A) 06/23/2016 2344     RADIOLOGY: Ct Abdomen Pelvis W Contrast  Result Date: 06/24/2016 CLINICAL DATA:  71 y/o F; left lower quadrant abdominal pain with vomiting and  diarrhea. EXAM: CT ABDOMEN AND PELVIS WITH CONTRAST TECHNIQUE: Multidetector CT imaging of the abdomen and pelvis was performed using the standard protocol following bolus administration of intravenous contrast. CONTRAST:  43mL ISOVUE-300 IOPAMIDOL (ISOVUE-300) INJECTION 61% COMPARISON:  01/28/2006 CT of the abdomen and pelvis. FINDINGS: Lower chest: Stable 3 mm nodule of the left lung base compatible with benign etiology. Mild calcific atherosclerosis of the carotid arteries. Small hiatal hernia. Hepatobiliary: No focal liver abnormality is seen. Status post cholecystectomy. No biliary dilatation. Pancreas: Unremarkable. No pancreatic ductal dilatation or surrounding inflammatory changes. Spleen: Normal in size without focal abnormality. Adrenals/Urinary Tract: Adrenal glands are unremarkable. Kidneys are normal, without renal calculi, focal lesion, or hydronephrosis. Bladder is unremarkable. Stomach/Bowel: Stomach is within normal limits. Appendix appears normal. No evidence of bowel wall thickening, distention, or inflammatory changes. Mild sigmoid diverticulosis without evidence for acute diverticulitis. Vascular/Lymphatic: Aortic atherosclerosis. No enlarged abdominal or pelvic lymph nodes. Reproductive: Status post hysterectomy. No adnexal masses. Other: No abdominal wall hernia or abnormality. No abdominopelvic ascites. Musculoskeletal: Posterior instrumented fusion and interbody fusion of the L3 through S1 vertebral bodies. No acute osseous abnormality is evident. IMPRESSION: 1. No acute process identified as explanation for abdominal pain. 2. Mild calcific atherosclerosis of the coronary arteries. 3. Small hiatal hernia. 4. Sigmoid diverticulosis without evidence for diverticulitis. 5. Moderate calcific atherosclerosis of abdominal aorta. Electronically Signed   By: Kristine Garbe M.D.   On: 06/24/2016 04:56    EKG: Orders placed or performed in visit on 03/06/16  . EKG 12-Lead     IMPRESSION AND PLAN: 71 year old female patient with history of diverticulosis, arthritis, anxiety disorder, diabetes mellitus, chronic back pain presented to the emergency room with abdominal discomfort, nausea and vomiting. Admitting diagnosis 1. Abdominal pain 2. Nausea and vomiting 3. Urinary tract infection 4. Dehydration 5. History of diverticulosis Treatment plan Admit patient to medical floor observation bed IV fluid hydration IV Levaquin antibiotic 500 MG daily Antiemetics for nausea and vomiting Pain management with IV morphine Follow-up electrolytes Control blood sugars Supportive care  All the records are reviewed and case discussed with ED provider. Management plans discussed with the patient, family and they are in agreement.  CODE STATUS:FULL CODE Surrogate decision maker : Husband Code Status History    Date Active Date Inactive Code  Status Order ID Comments User Context   11/14/2015  6:33 PM 11/18/2015  6:51 PM Full Code [id number]  Newman Pies, MD Inpatient   09/12/2015 10:48 AM 09/13/2015  3:15 AM Full Code [id number]  Logan Bores, MD HOV       TOTAL TIME TAKING CARE OF THIS PATIENT: 50 minutes.    Saundra Shelling M.D on 06/24/2016 at 5:53 AM  Between 7am to 6pm - Pager - [phone]  After 6pm go to [email or web address] - password [email or web address] [facility]  Office  [phone]  CC: Primary care physician; Rusty Aus, MD

## 2016-06-24 NOTE — ED Provider Notes (Signed)
[facility] Emergency Department Provider Note   ____________________________________________   First MD Initiated Contact with Patient 06/24/16 0258     (approximate)  I have reviewed the triage vital signs and the nursing notes.   HISTORY  Chief Complaint Abdominal Pain; Emesis; and Fever    HPI [name] is a 71 y.o. female who comes into the hospital today with abdominal pain. The patient reports that she's had this pain for about a week. She was diagnosed with her primary care physician with diverticulitis after having some left lower quadrant pain. The patient was placed on ciprofloxacin and Flagyl. She reports to that the pain has persisted. She also reports that she's been vomiting and unable to keep anything down. The patient is a diabetic and has been concerned about her blood sugars. She reports that she has not seen any improvement with the medication. She's felt feverish and had some chills. Her ankles also been swollen. She noticed some left arm pain intermittently. The patient reports that she's had a history of diverticulitis as well as intra-abdominal abscess. The patient is here today for evaluation. She rates her pain a 4-5 out of 10 in intensity.   Past Medical History:  Diagnosis Date  . Anxiety   . Arthritis   . Chronic back pain    stenosis.degenerative disc,some scoliosis  . Constipation    takes Stool Softener daily  . Coronary artery disease   . Depression    takes Cymbalta daily  . Diabetes mellitus    Type 2 diabetic. Average fasting blood sugar runs high 170-200  . Diverticulosis   . E coli infection   . GERD (gastroesophageal reflux disease)    takes Nexium daily  . Headache   . Headache 02/16/2016  . Hemorrhoids   . History of colon polyps    benign  . History of gout    doesn't take any meds  . History of hiatal hernia   . History of vertigo    doesn't take any meds  . Hyperlipidemia    takes Praluent  daily  . Hypertension    currently BP medications are on hold   . Hypothyroidism    takes Synthroid daily  . Insomnia    takes Restoril nightly  . Joint pain   . Joint swelling   . Muscle spasm    takes Robaxin as needed  . OSA on CPAP   . Periodic heart flutter   . Peripheral edema    takes LAsix as needed  . Peripheral vascular disease ([place])    AAA as stated per pt / was just discovered and pt states has not been referred to vascular MD   . Restless leg    takes Requip daily  . Rosacea   . Sleep apnea   . Urinary incontinence   . Urinary urgency   . Varicose veins   . Weakness    numbness and tingling.    Patient Active Problem List   Diagnosis Date Noted  . Abdominal pain 06/24/2016  . Headache 02/16/2016  . E coli infection   . Abscess of back   . Sepsis due to Escherichia coli (E. coli) ([place])   . 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 Wound infection after surgery 12/02/2015  . Lumbar degenerative disc disease 11/14/2015  . Itching 02/24/2014  . Anxiety 02/24/2014  . Fatigue 10/23/2013  . Chest pain 06/09/2012  . CAD (coronary artery disease) 06/09/2012  . Hyperlipidemia 08/09/2011  .  Arm pain 06/28/2011  . Tachycardia 05/05/2010  . Diabetes mellitus ([place]) 05/05/2010  . OSA on CPAP 05/05/2010  . HTN (hypertension) 05/05/2010  . Edema 05/05/2010  . HYPERTRIGLYCERIDEMIA 06/08/2008  . DYSPNEA 06/08/2008    Past Surgical History:  Procedure Laterality Date  . ABDOMINAL HYSTERECTOMY     with BSo  . CARDIAC CATHETERIZATION  2013   Normal  . CARPAL TUNNEL RELEASE Bilateral   . CATARACT EXTRACTION, BILATERAL    . CHOLECYSTECTOMY    . COLONOSCOPY    . DIAGNOSTIC LAPAROSCOPY     multiple times  . DILATION AND CURETTAGE OF UTERUS    . ESOPHAGOGASTRODUODENOSCOPY (EGD) WITH PROPOFOL N/A 11/01/2014   Procedure: ESOPHAGOGASTRODUODENOSCOPY (EGD) WITH PROPOFOL;  Surgeon: Hulen Luster, MD;  Location: [facility] ENDOSCOPY;  Service: Gastroenterology;  Laterality:  N/A;  . IMPLANTABLE CONTACT LENS IMPLANTATION     bilateral  . LAMINECTOMY  11/13/2015  . LUMBAR WOUND DEBRIDEMENT N/A 12/02/2015   Procedure: WOUND Exploration;  Surgeon: Consuella Lose, MD;  Location: [facility];  Service: Neurosurgery;  Laterality: N/A;  . PICC LINE PLACE PERIPHERAL ([place] HX)     right upper arm   . ROTATOR CUFF REPAIR Left   . SAVORY DILATION N/A 11/01/2014   Procedure: SAVORY DILATION;  Surgeon: Hulen Luster, MD;  Location: [facility] ENDOSCOPY;  Service: Gastroenterology;  Laterality: N/A;  . TONSILLECTOMY    . TRIGGER FINGER RELEASE Bilateral     Prior to Admission medications   Medication Sig Start Date End Date Taking? Authorizing Provider  Alirocumab (PRALUENT) 150 MG/ML SOPN Inject 1 pen into the skin every 14 (fourteen) days. 01/12/16  Yes Minna Merritts, MD  aspirin 81 MG tablet Take 81 mg by mouth daily.     Yes [provider]  cholecalciferol (VITAMIN D) 1000 UNITS tablet Take 1,000 Units by mouth daily.   Yes [provider]  desonide (DESOWEN) 0.05 % cream Apply 1 application topically 2 (two) times daily.   Yes [provider]  gabapentin (NEURONTIN) 300 MG capsule Take 300 mg by mouth 4 (four) times daily.    Yes [provider]  hydrOXYzine (ATARAX/VISTARIL) 50 MG tablet Take 50 mg by mouth 2 (two) times daily.  05/26/12  Yes [provider]  insulin aspart (NOVOLOG) 100 UNIT/ML FlexPen Inject 10 Units into the skin See admin instructions. 10 units before breakfast, 10 units before lunch, 16 units at dinner, >200 increase by 2 units per sliding scale 03/01/15 06/24/16 Yes [provider]  insulin glargine (LANTUS) 100 UNIT/ML injection Inject 30 Units into the skin daily. Take every morning    Yes [provider]  levothyroxine (SYNTHROID, LEVOTHROID) 75 MCG tablet Take 75 mcg by mouth daily before breakfast.  11/24/12  Yes [provider]  lisinopril (PRINIVIL,ZESTRIL) 20 MG tablet Take 1  tablet (20 mg total) by mouth daily. Patient taking differently: Take 10 mg by mouth 2 (two) times daily.  02/16/14  Yes Gollan, Kathlene November, MD  methocarbamol (ROBAXIN) 750 MG tablet Take 750 mg by mouth 3 (three) times daily as needed for muscle spasms.    Yes [provider]  metoprolol succinate (TOPROL XL) 25 MG 24 hr tablet Take 1 tablet (25 mg total) by mouth daily. 03/06/16  Yes Gollan, Kathlene November, MD  nitroGLYCERIN (NITROSTAT) 0.4 MG SL tablet Place 1 tablet (0.4 mg total) under the tongue every 5 (five) minutes as needed for chest pain. 03/06/16 09/18/17 Yes Gollan, Kathlene November, MD  ropinirole (REQUIP)  5 MG tablet Take 5 mg by mouth 2 (two) times daily.   Yes [provider]  temazepam (RESTORIL) 30 MG capsule Take 30 mg by mouth at bedtime.   Yes [provider]  torsemide (DEMADEX) 20 MG tablet Take 20 mg by mouth daily.   Yes [provider]  traMADol (ULTRAM) 50 MG tablet Take 50 mg by mouth 3 (three) times daily as needed for moderate pain.   Yes [provider]  DULoxetine (CYMBALTA) 60 MG capsule Take 60 mg by mouth daily.    [provider]  esomeprazole (NEXIUM) 40 MG capsule Take 40 mg by mouth 3 (three) times daily.     [provider]  furosemide (LASIX) 20 MG tablet Take 20 mg by mouth.     [provider]  isosorbide mononitrate (IMDUR) 30 MG 24 hr tablet Take 30 mg by mouth as needed. 07/25/11 09/08/15  Minna Merritts, MD  propranolol (INDERAL) 20 MG tablet Take 1 tablet (20 mg total) by mouth 3 (three) times daily as needed. Patient not taking: Reported on 06/24/2016 03/06/16   Minna Merritts, MD    Allergies Ceftin [cefuroxime axetil]; Codeine; Penicillins; and Vicodin [hydrocodone-acetaminophen]  Family History  Problem Relation Age of Onset  . Heart disease Mother   . Breast cancer Mother 67  . Heart disease Father   . Heart attack Sister   . Heart disease Sister   . Heart disease Brother      Social History Social History  Substance Use Topics  . Smoking status: Never Smoker  . Smokeless tobacco: Never Used  . Alcohol use No    Review of Systems  Constitutional: No fever/chills Eyes: No visual changes. ENT: No sore throat. Cardiovascular: Denies chest pain. Respiratory: Denies shortness of breath. Gastrointestinal:  abdominal pain, nausea, vomiting.  No diarrhea.  No constipation. Genitourinary: Negative for dysuria. Musculoskeletal: Negative for back pain. Skin: Negative for rash. Neurological: Negative for headaches, focal weakness or numbness.   ____________________________________________   PHYSICAL EXAM:  VITAL SIGNS: ED Triage Vitals  Enc Vitals Group     BP 06/23/16 2345 (!) 150/83     Pulse Rate 06/23/16 2345 (!) 108     Resp 06/23/16 2345 16     Temp 06/23/16 2345 97.7 F (36.5 C)     Temp Source 06/23/16 2345 Oral     SpO2 06/23/16 2345 100 %     Weight 06/23/16 2345 203 lb (92.1 kg)     Height --      Head Circumference --      Peak Flow --      Pain Score 06/24/16 0233 4     Pain Loc --      Pain Edu? --      Excl. in [place]? --     Constitutional: Alert and oriented. Well appearing and in moderate distress. Eyes: Conjunctivae are normal. PERRL. EOMI. Head: Atraumatic. Nose: No congestion/rhinnorhea. Mouth/Throat: Mucous membranes are moist.  Oropharynx non-erythematous. Cardiovascular: Normal rate, regular rhythm. Grossly normal heart sounds.  Good peripheral circulation. Respiratory: Normal respiratory effort.  No retractions. Lungs CTAB. Gastrointestinal: Soft with some left lower quadrant tenderness to palpation. No distention. Positive bowel sounds Musculoskeletal: No lower extremity tenderness nor edema.  Neurologic:  Normal speech and language.  Skin:  Skin is warm, dry and intact.  Psychiatric: Mood and affect are normal.   ____________________________________________   LABS (all labs ordered are listed, but only  abnormal results are displayed)  Labs Reviewed  COMPREHENSIVE METABOLIC PANEL - Abnormal; Notable for the following:       Result Value   Glucose, Bld 103 (*)    BUN 32 (*)    Creatinine, Ser 1.43 (*)    GFR calc non Af Amer 36 (*)    GFR calc Af Amer 42 (*)    All other components within normal limits  CBC - Abnormal; Notable for the following:    RDW 15.3 (*)    All other components within normal limits  URINALYSIS, COMPLETE (UACMP) WITH MICROSCOPIC - Abnormal; Notable for the following:    Color, Urine YELLOW (*)    APPearance CLOUDY (*)    Leukocytes, UA LARGE (*)    Bacteria, UA MANY (*)    Squamous Epithelial / LPF 6-30 (*)    All other components within normal limits  GLUCOSE, CAPILLARY - Abnormal; Notable for the following:    Glucose-Capillary 101 (*)    All other components within normal limits  URINE CULTURE  C DIFFICILE QUICK SCREEN W PCR REFLEX  GASTROINTESTINAL PANEL BY PCR, STOOL (REPLACES STOOL CULTURE)  LIPASE, BLOOD  GLUCOSE, CAPILLARY   ____________________________________________  EKG  none ____________________________________________  RADIOLOGY  CT abd and pelvis ____________________________________________   PROCEDURES  Procedure(s) performed: None  Procedures  Critical Care performed: No  ____________________________________________   INITIAL IMPRESSION / ASSESSMENT AND PLAN / ED COURSE  Pertinent labs & imaging results that were available during my care of the patient were reviewed by me and considered in my medical decision making (see chart for details).  This Is a 71 year old female who comes into the hospital today with some left lower quadrant abdominal pain. I did send the patient for a CT scan to evaluate for diverticulitis but it was negative. The patient's urine does show signs of infection with many bacteria and 6-30 whites. Given that the patient has been vomiting and she does have a creatinine of 1.43 I will admit the  patient for some pyelonephritis. I discussed this with the patient and she understands and agrees the plan as stated. The patient will be admitted to the hospitalist service.  Clinical Course as of Jun 25 850  Sun Jun 24, 2016  0500 1. No acute process identified as explanation for abdominal pain. 2. Mild calcific atherosclerosis of the coronary arteries. 3. Small hiatal hernia. 4. Sigmoid diverticulosis without evidence for diverticulitis. 5. Moderate calcific atherosclerosis of abdominal aorta.   CT Abdomen Pelvis W Contrast [AW]    Clinical Course User Index [AW] Loney Hering, MD     ____________________________________________   FINAL CLINICAL IMPRESSION(S) / ED DIAGNOSES  Final diagnoses:  Left lower quadrant pain  Acute cystitis without hematuria  Pyelonephritis      NEW MEDICATIONS STARTED DURING THIS VISIT:  Current Discharge Medication List       Note:  This document was prepared using Dragon voice recognition software and may include unintentional dictation errors.    Loney Hering, MD 06/24/16 [phone]

## 2016-06-24 NOTE — Progress Notes (Signed)
[place] [facility] [place] NAME: [name]    MR#:  [id number]  DATE OF BIRTH:  January 28, 1946  SUBJECTIVE:  CHIEF COMPLAINT:   Chief Complaint  Patient presents with  . Abdominal Pain  . Emesis  . Fever  The patient is 71 year old Caucasian female with past medical history significant for history of arthritis, anxiety, chronic back pain, diabetes, diverticulosis, Escherichia coli infection in the past, who presented to the hospital with complaints of left lower quadrant abdominal pain for the past one week, fevers, chills, diarrhea, which was estimated to antibiotic therapy for diverticulitis, dysuria. CT scan in the emergency room revealed no significant pathology. Urinalysis revealed pyuria, patient was admitted to the hospital for further evaluation, treatment, she was complaining of nausea and vomiting. On arrival as well. She feels somewhat better today, denies any flank pains. Microbiology is pending  Review of Systems  Constitutional: Negative for chills, fever and weight loss.  HENT: Negative for congestion.   Eyes: Negative for blurred vision and double vision.  Respiratory: Negative for cough, sputum production, shortness of breath and wheezing.   Cardiovascular: Negative for chest pain, palpitations, orthopnea, leg swelling and PND.  Gastrointestinal: Positive for abdominal pain, diarrhea and nausea. Negative for blood in stool, constipation and vomiting.  Genitourinary: Negative for dysuria, frequency, hematuria and urgency.  Musculoskeletal: Negative for falls.  Neurological: Negative for dizziness, tremors, focal weakness and headaches.  Endo/Heme/Allergies: Does not bruise/bleed easily.  Psychiatric/Behavioral: Negative for depression. The patient does not have insomnia.     VITAL SIGNS: Blood pressure 133/64, pulse 84, temperature 97.9 F (36.6 C), temperature source Oral, resp. rate 20, height 5\' 3"  (1.6 m), weight 101.6  kg (223 lb 15.8 oz), SpO2 100 %.  PHYSICAL EXAMINATION:   GENERAL:  71 y.o.-year-old patient lying in the bed with no acute distress.  EYES: Pupils equal, round, reactive to light and accommodation. No scleral icterus. Extraocular muscles intact.  HEENT: Head atraumatic, normocephalic. Oropharynx and nasopharynx clear.  NECK:  Supple, no jugular venous distention. No thyroid enlargement, no tenderness.  LUNGS: Normal breath sounds bilaterally, no wheezing, rales,rhonchi or crepitation. No use of accessory muscles of respiration.  CARDIOVASCULAR: S1, S2 normal. No murmurs, rubs, or gallops.  ABDOMEN: Soft, mild discomfort in left lower quadrant, no rebound or guarding, nondistended. Bowel sounds present. No organomegaly or mass.  EXTREMITIES: No pedal edema, cyanosis, or clubbing.  NEUROLOGIC: Cranial nerves II through XII are intact. Muscle strength 5/5 in all extremities. Sensation intact. Gait not checked.  PSYCHIATRIC: The patient is alert and oriented x 3.  SKIN: No obvious rash, lesion, or ulcer.   ORDERS/RESULTS REVIEWED:   CBC  Recent Labs Lab 06/23/16 2344  WBC 9.2  HGB 12.7  HCT 37.6  PLT 241  MCV 83.8  MCH 28.3  MCHC 33.7  RDW 15.3*   ------------------------------------------------------------------------------------------------------------------  Chemistries   Recent Labs Lab 06/23/16 2344  NA 139  K 3.6  CL 101  CO2 29  GLUCOSE 103*  BUN 32*  CREATININE 1.43*  CALCIUM 9.0  AST 22  ALT 21  ALKPHOS 112  BILITOT 0.5   ------------------------------------------------------------------------------------------------------------------ estimated creatinine clearance is 41.1 mL/min (A) (by C-G formula based on SCr of 1.43 mg/dL (H)). ------------------------------------------------------------------------------------------------------------------ No results for input(s): TSH, T4TOTAL, T3FREE, THYROIDAB in the last 72 hours.  Invalid input(s):  FREET3  Cardiac Enzymes No results for input(s): CKMB, TROPONINI, MYOGLOBIN in the last 168 hours.  Invalid input(s): CK ------------------------------------------------------------------------------------------------------------------ Invalid  input(s): POCBNP ---------------------------------------------------------------------------------------------------------------  RADIOLOGY: Ct Abdomen Pelvis W Contrast  Result Date: 06/24/2016 CLINICAL DATA:  71 y/o F; left lower quadrant abdominal pain with vomiting and diarrhea. EXAM: CT ABDOMEN AND PELVIS WITH CONTRAST TECHNIQUE: Multidetector CT imaging of the abdomen and pelvis was performed using the standard protocol following bolus administration of intravenous contrast. CONTRAST:  66mL ISOVUE-300 IOPAMIDOL (ISOVUE-300) INJECTION 61% COMPARISON:  01/28/2006 CT of the abdomen and pelvis. FINDINGS: Lower chest: Stable 3 mm nodule of the left lung base compatible with benign etiology. Mild calcific atherosclerosis of the carotid arteries. Small hiatal hernia. Hepatobiliary: No focal liver abnormality is seen. Status post cholecystectomy. No biliary dilatation. Pancreas: Unremarkable. No pancreatic ductal dilatation or surrounding inflammatory changes. Spleen: Normal in size without focal abnormality. Adrenals/Urinary Tract: Adrenal glands are unremarkable. Kidneys are normal, without renal calculi, focal lesion, or hydronephrosis. Bladder is unremarkable. Stomach/Bowel: Stomach is within normal limits. Appendix appears normal. No evidence of bowel wall thickening, distention, or inflammatory changes. Mild sigmoid diverticulosis without evidence for acute diverticulitis. Vascular/Lymphatic: Aortic atherosclerosis. No enlarged abdominal or pelvic lymph nodes. Reproductive: Status post hysterectomy. No adnexal masses. Other: No abdominal wall hernia or abnormality. No abdominopelvic ascites. Musculoskeletal: Posterior instrumented fusion and interbody fusion of  the L3 through S1 vertebral bodies. No acute osseous abnormality is evident. IMPRESSION: 1. No acute process identified as explanation for abdominal pain. 2. Mild calcific atherosclerosis of the coronary arteries. 3. Small hiatal hernia. 4. Sigmoid diverticulosis without evidence for diverticulitis. 5. Moderate calcific atherosclerosis of abdominal aorta. Electronically Signed   By: Kristine Garbe M.D.   On: 06/24/2016 04:56    EKG:  Orders placed or performed in visit on 03/06/16  . EKG 12-Lead    ASSESSMENT AND PLAN:  Active Problems:   Abdominal pain  #1. Left lower quadrant abdominal pain, possibly related to urinary tract infection, improving with conservative therapy #2. Diarrhea, get stool cultures, including C. Difficile, continue IV fluids, continue full liquid diet, advance diet as tolerated #3. Urinary tract infection, awaiting for urinary cultures, continue patient on levofloxacin #4 acute renal insufficiency, initiate patient on IV fluids, possibly related to dehydration, diarrhea, CT scan of abdomen showed no hydronephrosis #5. Diabetes mellitus type 2, continue patient outpatient medications, IV fluids, sliding scale insulin #6. Nausea, vomiting, liquid diet for now, advance as tolerated, supportive therapy, IV fluids  Management plans discussed with the patient, family and they are in agreement.   DRUG ALLERGIES:  Allergies  Allergen Reactions  . Ceftin [Cefuroxime Axetil] Diarrhea    diarrhea  . Codeine Rash       . Penicillins Rash    Has patient had a PCN reaction causing immediate rash, facial/tongue/throat swelling, SOB or lightheadedness with hypotension: YES Has patient had a PCN reaction causing severe rash involving mucus membranes or skin necrosis: NO Has patient had a PCN retioion that required hospitalization NO Has patient had a PCN reaction occurring within the last 10 years: YES If all of the above answers are "NO", then may proceed with  Cephalosporin use.  . Vicodin [Hydrocodone-Acetaminophen] Rash     Also passes out    CODE STATUS:     Code Status Orders        Start     Ordered   06/24/16 0710  Full code  Continuous     06/24/16 0709    Code Status History    Date Active Date Inactive Code Status Order ID Comments User Context   11/14/2015  6:33 PM 11/18/2015  6:51 PM Full Code [id number]  Newman Pies, MD Inpatient   09/12/2015 10:48 AM 09/13/2015  3:15 AM Full Code [id number]  Logan Bores, MD HOV    Advance Directive Documentation     Most Recent Value  Type of Advance Directive  Healthcare Power of Attorney, Living will  Pre-existing out of facility DNR order (yellow form or pink MOST form)  -  "MOST" Form in Place?  -      TOTAL TIME TAKING CARE OF THIS PATIENT: 30 minutes.    Theodoro Grist M.D on 06/24/2016 at 11:54 AM  Between 7am to 6pm - Pager - [phone]  After 6pm go to [email or web address] - password [email or web address] [facility]  Office  [phone]  CC: Primary care physician; Rusty Aus, MD

## 2016-06-24 NOTE — ED Notes (Signed)
Warm blankets provided. No emesis noted since triage.

## 2016-06-24 NOTE — ED Notes (Signed)
Pt presents to ED 04 from home c/o LLQ abdominal pain x8 days; pt states new onset of nausea with 2 episodes of emesis yesterday and new onset of diarrhea with 2 episodes of diarrhea yesterday; pt denies any blood in emesis or stool; pt also states aching pain in the left arm, history of 3-50% blockages in the heart, no chest pain; pt states using C-PAP machine at home at night, no shortness of breath reported; pt denies any light headedness, or dizziness; pt is awake, alert and oriented x4 and able to speak in complete sentences.

## 2016-06-24 NOTE — Progress Notes (Signed)
Pharmacy Antibiotic Note  [name] is a 71 y.o. female admitted on 06/24/2016 with Intra-abdominal infection/UTI.  Pharmacy has been consulted for Levaquin dosing.  Plan: Will initiate levaquin 750 mg q48h based on pts. CrCl 20 - 50 ml/min  No recent ECG to assess QTc  Weight: 203 lb (92.1 kg)  Temp (24hrs), Avg:97.7 F (36.5 C), Min:97.7 F (36.5 C), Max:97.7 F (36.5 C)   Recent Labs Lab 06/23/16 2344  WBC 9.2  CREATININE 1.43*    Estimated Creatinine Clearance: 38.9 mL/min (A) (by C-G formula based on SCr of 1.43 mg/dL (H)).    Allergies  Allergen Reactions  . Ceftin [Cefuroxime Axetil] Diarrhea    diarrhea  . Codeine Rash       . Penicillins Rash    Has patient had a PCN reaction causing immediate rash, facial/tongue/throat swelling, SOB or lightheadedness with hypotension: YES Has patient had a PCN reaction causing severe rash involving mucus membranes or skin necrosis: NO Has patient had a PCN retioion that required hospitalization NO Has patient had a PCN reaction occurring within the last 10 years: YES If all of the above answers are "NO", then may proceed with Cephalosporin use.  . Vicodin [Hydrocodone-Acetaminophen] Rash     Also passes out     Thank you for allowing pharmacy to be a part of this patient's care.  Tobie Lords, PharmD, BCPS Clinical Pharmacist 06/24/2016

## 2016-06-24 NOTE — Progress Notes (Signed)
Dr. Leslye Peer notified of expired IVF order. Acknowledged; new order written. Barbaraann Faster, RN 7:39 PM 06/24/2016

## 2016-06-24 NOTE — Care Management Obs Status (Signed)
[facility] NOTIFICATION   Patient Details  Name: [name] MRN: [id number] Date of Birth: 23-Aug-1945   Medicare Observation Status Notification Given:  Yes [facility] letter given)    Mardene Speak, RN 06/24/2016, 4:38 PM

## 2016-06-25 DIAGNOSIS — R1032 Left lower quadrant pain: Secondary | ICD-10-CM | POA: Diagnosis not present

## 2016-06-25 DIAGNOSIS — N289 Disorder of kidney and ureter, unspecified: Secondary | ICD-10-CM

## 2016-06-25 DIAGNOSIS — A084 Viral intestinal infection, unspecified: Secondary | ICD-10-CM

## 2016-06-25 DIAGNOSIS — R8281 Pyuria: Secondary | ICD-10-CM

## 2016-06-25 LAB — CBC
HCT: 34.3 % — ABNORMAL LOW (ref 35.0–47.0)
Hemoglobin: 11.6 g/dL — ABNORMAL LOW (ref 12.0–16.0)
MCH: 28.4 pg (ref 26.0–34.0)
MCHC: 33.7 g/dL (ref 32.0–36.0)
MCV: 84.3 fL (ref 80.0–100.0)
PLATELETS: 206 10*3/uL (ref 150–440)
RBC: 4.07 MIL/uL (ref 3.80–5.20)
RDW: 15.3 % — ABNORMAL HIGH (ref 11.5–14.5)
WBC: 5.5 10*3/uL (ref 3.6–11.0)

## 2016-06-25 LAB — BASIC METABOLIC PANEL
Anion gap: 5 (ref 5–15)
BUN: 17 mg/dL (ref 6–20)
CALCIUM: 8.5 mg/dL — AB (ref 8.9–10.3)
CO2: 30 mmol/L (ref 22–32)
CREATININE: 1.15 mg/dL — AB (ref 0.44–1.00)
Chloride: 105 mmol/L (ref 101–111)
GFR calc non Af Amer: 47 mL/min — ABNORMAL LOW (ref >60)
GFR, EST AFRICAN AMERICAN: 54 mL/min — AB (ref >60)
Glucose, Bld: 108 mg/dL — ABNORMAL HIGH (ref 65–99)
Potassium: 3.5 mmol/L (ref 3.5–5.1)
Sodium: 140 mmol/L (ref 135–145)

## 2016-06-25 LAB — URINE CULTURE

## 2016-06-25 LAB — GLUCOSE, CAPILLARY
GLUCOSE-CAPILLARY: 82 mg/dL (ref 65–99)
Glucose-Capillary: 173 mg/dL — ABNORMAL HIGH (ref 65–99)

## 2016-06-25 MED ORDER — HYOSCYAMINE SULFATE 0.125 MG/ML PO SOLN
0.2500 mg | ORAL | Status: DC | PRN
  Filled 2016-06-25: qty 2

## 2016-06-25 MED ORDER — HYOSCYAMINE SULFATE 0.125 MG SL SUBL: 0.2500 mg | SUBLINGUAL_TABLET | SUBLINGUAL | 0 refills | Status: DC | PRN

## 2016-06-25 MED ORDER — RISAQUAD PO CAPS: 1.0000 | ORAL_CAPSULE | Freq: Every day | ORAL | 0 refills | Status: DC

## 2016-06-25 MED ORDER — RISAQUAD PO CAPS
1.0000 | ORAL_CAPSULE | Freq: Every day | ORAL | Status: DC
  Administered 2016-06-25: 1 via ORAL
  Filled 2016-06-25: qty 1

## 2016-06-25 MED ORDER — ONDANSETRON HCL 4 MG PO TABS: 4.0000 mg | ORAL_TABLET | Freq: Four times a day (QID) | ORAL | 0 refills | Status: DC | PRN

## 2016-06-25 MED ORDER — HYOSCYAMINE SULFATE 0.125 MG SL SUBL
0.2500 mg | SUBLINGUAL_TABLET | SUBLINGUAL | Status: DC | PRN
  Administered 2016-06-25: 0.25 mg via SUBLINGUAL
  Filled 2016-06-25 (×2): qty 2

## 2016-06-25 NOTE — Progress Notes (Signed)
Patient discharge teaching given, including activity, diet, follow-up appoints, and medications. Patient verbalized understanding of all discharge instructions. IV access was d/c'd. Vitals are stable. Skin is intact except as charted in most recent assessments. Pt to be escorted out by NT, to be driven home by family.  [name]  

## 2016-06-25 NOTE — Discharge Summary (Signed)
[facility] [place] NAME: [name]    MR#:  [id number]  DATE OF BIRTH:  Jun 25, 1945  DATE OF ADMISSION:  06/24/2016 ADMITTING PHYSICIAN: Saundra Shelling, MD  DATE OF DISCHARGE: No discharge date for patient encounter.  PRIMARY CARE PHYSICIAN: Rusty Aus, MD     ADMISSION DIAGNOSIS:  Pyelonephritis [N12] Left lower quadrant pain [R10.32] Acute cystitis without hematuria [N30.00]  DISCHARGE DIAGNOSIS:  Active Problems:   Abdominal pain   Viral gastroenteritis   Pyuria   Acute renal insufficiency   SECONDARY DIAGNOSIS:   Past Medical History:  Diagnosis Date  . Anxiety   . Arthritis   . Chronic back pain    stenosis.degenerative disc,some scoliosis  . Constipation    takes Stool Softener daily  . Coronary artery disease   . Depression    takes Cymbalta daily  . Diabetes mellitus    Type 2 diabetic. Average fasting blood sugar runs high 170-200  . Diverticulosis   . E coli infection   . GERD (gastroesophageal reflux disease)    takes Nexium daily  . Headache   . Headache 02/16/2016  . Hemorrhoids   . History of colon polyps    benign  . History of gout    doesn't take any meds  . History of hiatal hernia   . History of vertigo    doesn't take any meds  . Hyperlipidemia    takes Praluent daily  . Hypertension    currently BP medications are on hold   . Hypothyroidism    takes Synthroid daily  . Insomnia    takes Restoril nightly  . Joint pain   . Joint swelling   . Muscle spasm    takes Robaxin as needed  . OSA on CPAP   . Periodic heart flutter   . Peripheral edema    takes LAsix as needed  . Peripheral vascular disease ([place])    AAA as stated per pt / was just discovered and pt states has not been referred to vascular MD   . Restless leg    takes Requip daily  . Rosacea   . Sleep apnea   . Urinary incontinence   . Urinary urgency   . Varicose veins   . Weakness    numbness and  tingling.    .pro HOSPITAL COURSE:  The patient is 71 year old Caucasian female with past medical history significant for history of arthritis, anxiety, chronic back pain, diabetes, diverticulosis, Escherichia coli infection in the past, who presented to the hospital with complaints of left lower quadrant abdominal pain for the past one week, fevers, chills, diarrhea, dysuria. CT scan in the emergency room revealed no significant pathology. Urinalysis revealed pyuria, patient was admitted to the hospital for further evaluation, treatment. She was initiated on antibiotic therapy for suspected urinary tract infection. Stool cultures, including C. difficile were negative. Urine culture revealed 50,000 colony-forming units of yeast. It was felt that patient had likely viral gastroenteritis, which resolved. No antibiotic therapy was recommended upon discharge. Discussion by problem: #1. Left lower quadrant abdominal pain, possibly related to spasms, patient was initiated on Levsin, continue therapy at home, follow-up with primary care physician for further recommendations, stool cultures were negative for enteropathogenic bacteria, C. difficile. #2. Diarrhea, stool cultures, including C. Difficile, were negative, diet was advanced, diarrhea has resolved #3. Pyuria, unlikely urinary tract infection,  urinary culture revealed 50,000 colony-forming units of yeast, discontinue antibiotic #4 acute  renal insufficiency, improved on IV fluids, likely related to dehydration, diarrhea, CT scan of abdomen showed no hydronephrosis, follow BMP as outpatient #5. Diabetes mellitus type 2, continue diabetic diet #6. Nausea, vomiting, diet was advanced, patient was able to tolerate diet well DISCHARGE CONDITIONS:   Stable  CONSULTS OBTAINED:    DRUG ALLERGIES:   Allergies  Allergen Reactions  . Ceftin [Cefuroxime Axetil] Diarrhea    diarrhea  . Codeine Rash       . Penicillins Rash    Has patient had a PCN  reaction causing immediate rash, facial/tongue/throat swelling, SOB or lightheadedness with hypotension: YES Has patient had a PCN reaction causing severe rash involving mucus membranes or skin necrosis: NO Has patient had a PCN retioion that required hospitalization NO Has patient had a PCN reaction occurring within the last 10 years: YES If all of the above answers are "NO", then may proceed with Cephalosporin use.  . Vicodin [Hydrocodone-Acetaminophen] Rash     Also passes out    DISCHARGE MEDICATIONS:   Current Discharge Medication List    START taking these medications   Details  acidophilus (RISAQUAD) CAPS capsule Take 1 capsule by mouth daily. Qty: 30 capsule, Refills: 0    hyoscyamine (LEVSIN SL) 0.125 MG SL tablet Place 2 tablets (0.25 mg total) under the tongue every 4 (four) hours as needed for cramping. Qty: 30 tablet, Refills: 0    ondansetron (ZOFRAN) 4 MG tablet Take 1 tablet (4 mg total) by mouth every 6 (six) hours as needed for nausea. Qty: 20 tablet, Refills: 0      CONTINUE these medications which have NOT CHANGED   Details  Alirocumab (PRALUENT) 150 MG/ML SOPN Inject 1 pen into the skin every 14 (fourteen) days. Qty: 6 pen, Refills: 12    aspirin 81 MG tablet Take 81 mg by mouth daily.      cholecalciferol (VITAMIN D) 1000 UNITS tablet Take 1,000 Units by mouth daily.    desonide (DESOWEN) 0.05 % cream Apply 1 application topically 2 (two) times daily.    gabapentin (NEURONTIN) 300 MG capsule Take 300 mg by mouth 4 (four) times daily.     hydrOXYzine (ATARAX/VISTARIL) 50 MG tablet Take 50 mg by mouth 2 (two) times daily.     insulin aspart (NOVOLOG) 100 UNIT/ML FlexPen Inject 10 Units into the skin See admin instructions. 10 units before breakfast, 10 units before lunch, 16 units at dinner, >200 increase by 2 units per sliding scale    insulin glargine (LANTUS) 100 UNIT/ML injection Inject 30 Units into the skin daily. Take every morning       levothyroxine (SYNTHROID, LEVOTHROID) 75 MCG tablet Take 75 mcg by mouth daily before breakfast.     methocarbamol (ROBAXIN) 750 MG tablet Take 750 mg by mouth 3 (three) times daily as needed for muscle spasms.     metoprolol succinate (TOPROL XL) 25 MG 24 hr tablet Take 1 tablet (25 mg total) by mouth daily. Qty: 90 tablet, Refills: 3    nitroGLYCERIN (NITROSTAT) 0.4 MG SL tablet Place 1 tablet (0.4 mg total) under the tongue every 5 (five) minutes as needed for chest pain. Qty: 25 tablet, Refills: 3    ropinirole (REQUIP) 5 MG tablet Take 5 mg by mouth 2 (two) times daily.    temazepam (RESTORIL) 30 MG capsule Take 30 mg by mouth at bedtime.    traMADol (ULTRAM) 50 MG tablet Take 50 mg by mouth 3 (three) times daily as needed for moderate pain.  DULoxetine (CYMBALTA) 60 MG capsule Take 60 mg by mouth daily.    esomeprazole (NEXIUM) 40 MG capsule Take 40 mg by mouth 3 (three) times daily.     isosorbide mononitrate (IMDUR) 30 MG 24 hr tablet Take 30 mg by mouth as needed.      STOP taking these medications     lisinopril (PRINIVIL,ZESTRIL) 20 MG tablet      torsemide (DEMADEX) 20 MG tablet      furosemide (LASIX) 20 MG tablet      propranolol (INDERAL) 20 MG tablet          DISCHARGE INSTRUCTIONS:    The patient is to follow-up with primary care physician within one week after discharge  If you experience worsening of your admission symptoms, develop shortness of breath, life threatening emergency, suicidal or homicidal thoughts you must seek medical attention immediately by calling 911 or calling your MD immediately  if symptoms less severe.  You Must read complete instructions/literature along with all the possible adverse reactions/side effects for all the Medicines you take and that have been prescribed to you. Take any new Medicines after you have completely understood and accept all the possible adverse reactions/side effects.   Please note  You were cared  for by a hospitalist during your hospital stay. If you have any questions about your discharge medications or the care you received while you were in the hospital after you are discharged, you can call the unit and asked to speak with the hospitalist on call if the hospitalist that took care of you is not available. Once you are discharged, your primary care physician will handle any further medical issues. Please note that NO REFILLS for any discharge medications will be authorized once you are discharged, as it is imperative that you return to your primary care physician (or establish a relationship with a primary care physician if you do not have one) for your aftercare needs so that they can reassess your need for medications and monitor your lab values.    Today   CHIEF COMPLAINT:   Chief Complaint  Patient presents with  . Abdominal Pain  . Emesis  . Fever    HISTORY OF PRESENT ILLNESS:     VITAL SIGNS:  Blood pressure 111/62, pulse 73, temperature 98 F (36.7 C), temperature source Oral, resp. rate 19, height 5\' 3"  (1.6 m), weight 101.6 kg (223 lb 15.8 oz), SpO2 97 %.  I/O:   Intake/Output Summary (Last 24 hours) at 06/25/16 1415 Last data filed at 06/25/16 1317  Gross per 24 hour  Intake          3202.17 ml  Output              700 ml  Net          2502.17 ml    PHYSICAL EXAMINATION:  GENERAL:  71 y.o.-year-old patient lying in the bed with no acute distress.  EYES: Pupils equal, round, reactive to light and accommodation. No scleral icterus. Extraocular muscles intact.  HEENT: Head atraumatic, normocephalic. Oropharynx and nasopharynx clear.  NECK:  Supple, no jugular venous distention. No thyroid enlargement, no tenderness.  LUNGS: Normal breath sounds bilaterally, no wheezing, rales,rhonchi or crepitation. No use of accessory muscles of respiration.  CARDIOVASCULAR: S1, S2 normal. No murmurs, rubs, or gallops.  ABDOMEN: Soft, non-tender, non-distended. Bowel sounds  present. No organomegaly or mass.  EXTREMITIES: No pedal edema, cyanosis, or clubbing.  NEUROLOGIC: Cranial nerves II through XII are intact. Muscle  strength 5/5 in all extremities. Sensation intact. Gait not checked.  PSYCHIATRIC: The patient is alert and oriented x 3.  SKIN: No obvious rash, lesion, or ulcer.   DATA REVIEW:   CBC  Recent Labs Lab 06/25/16 0349  WBC 5.5  HGB 11.6*  HCT 34.3*  PLT 206    Chemistries   Recent Labs Lab 06/23/16 2344 06/25/16 0349  NA 139 140  K 3.6 3.5  CL 101 105  CO2 29 30  GLUCOSE 103* 108*  BUN 32* 17  CREATININE 1.43* 1.15*  CALCIUM 9.0 8.5*  AST 22  --   ALT 21  --   ALKPHOS 112  --   BILITOT 0.5  --     Cardiac Enzymes No results for input(s): TROPONINI in the last 168 hours.  Microbiology Results  Results for orders placed or performed during the hospital encounter of 06/24/16  Urine culture     Status: Abnormal   Collection Time: 06/23/16 11:44 PM  Result Value Ref Range Status   Specimen Description URINE, RANDOM  Final   Special Requests NONE  Final   Culture 50,000 COLONIES/mL YEAST (A)  Final   Report Status 06/25/2016 FINAL  Final  C difficile quick scan w PCR reflex     Status: None   Collection Time: 06/24/16  1:00 PM  Result Value Ref Range Status   C Diff antigen NEGATIVE NEGATIVE Final   C Diff toxin NEGATIVE NEGATIVE Final   C Diff interpretation No C. difficile detected.  Final  Gastrointestinal Panel by PCR , Stool     Status: None   Collection Time: 06/24/16  1:00 PM  Result Value Ref Range Status   Campylobacter species NOT DETECTED NOT DETECTED Final   Plesimonas shigelloides NOT DETECTED NOT DETECTED Final   Salmonella species NOT DETECTED NOT DETECTED Final   Yersinia enterocolitica NOT DETECTED NOT DETECTED Final   Vibrio species NOT DETECTED NOT DETECTED Final   Vibrio cholerae NOT DETECTED NOT DETECTED Final   Enteroaggregative E coli (EAEC) NOT DETECTED NOT DETECTED Final    Enteropathogenic E coli (EPEC) NOT DETECTED NOT DETECTED Final   Enterotoxigenic E coli (ETEC) NOT DETECTED NOT DETECTED Final   Shiga like toxin producing E coli (STEC) NOT DETECTED NOT DETECTED Final   Shigella/Enteroinvasive E coli (EIEC) NOT DETECTED NOT DETECTED Final   Cryptosporidium NOT DETECTED NOT DETECTED Final   Cyclospora cayetanensis NOT DETECTED NOT DETECTED Final   Entamoeba histolytica NOT DETECTED NOT DETECTED Final   Giardia lamblia NOT DETECTED NOT DETECTED Final   Adenovirus F40/41 NOT DETECTED NOT DETECTED Final   Astrovirus NOT DETECTED NOT DETECTED Final   Norovirus GI/GII NOT DETECTED NOT DETECTED Final   Rotavirus A NOT DETECTED NOT DETECTED Final   Sapovirus (I, II, IV, and V) NOT DETECTED NOT DETECTED Final    RADIOLOGY:  Ct Abdomen Pelvis W Contrast  Result Date: 06/24/2016 CLINICAL DATA:  71 y/o F; left lower quadrant abdominal pain with vomiting and diarrhea. EXAM: CT ABDOMEN AND PELVIS WITH CONTRAST TECHNIQUE: Multidetector CT imaging of the abdomen and pelvis was performed using the standard protocol following bolus administration of intravenous contrast. CONTRAST:  18mL ISOVUE-300 IOPAMIDOL (ISOVUE-300) INJECTION 61% COMPARISON:  01/28/2006 CT of the abdomen and pelvis. FINDINGS: Lower chest: Stable 3 mm nodule of the left lung base compatible with benign etiology. Mild calcific atherosclerosis of the carotid arteries. Small hiatal hernia. Hepatobiliary: No focal liver abnormality is seen. Status post cholecystectomy. No biliary dilatation. Pancreas: Unremarkable.  No pancreatic ductal dilatation or surrounding inflammatory changes. Spleen: Normal in size without focal abnormality. Adrenals/Urinary Tract: Adrenal glands are unremarkable. Kidneys are normal, without renal calculi, focal lesion, or hydronephrosis. Bladder is unremarkable. Stomach/Bowel: Stomach is within normal limits. Appendix appears normal. No evidence of bowel wall thickening, distention, or  inflammatory changes. Mild sigmoid diverticulosis without evidence for acute diverticulitis. Vascular/Lymphatic: Aortic atherosclerosis. No enlarged abdominal or pelvic lymph nodes. Reproductive: Status post hysterectomy. No adnexal masses. Other: No abdominal wall hernia or abnormality. No abdominopelvic ascites. Musculoskeletal: Posterior instrumented fusion and interbody fusion of the L3 through S1 vertebral bodies. No acute osseous abnormality is evident. IMPRESSION: 1. No acute process identified as explanation for abdominal pain. 2. Mild calcific atherosclerosis of the coronary arteries. 3. Small hiatal hernia. 4. Sigmoid diverticulosis without evidence for diverticulitis. 5. Moderate calcific atherosclerosis of abdominal aorta. Electronically Signed   By: Kristine Garbe M.D.   On: 06/24/2016 04:56   US Venous Img Lower Bilateral  Result Date: 06/24/2016 CLINICAL DATA:  Initial evaluation for acute bilateral lower extremity pain for 1 day. EXAM: BILATERAL LOWER EXTREMITY VENOUS DOPPLER ULTRASOUND TECHNIQUE: Gray-scale sonography with graded compression, as well as color Doppler and duplex ultrasound were performed to evaluate the lower extremity deep venous systems from the level of the common femoral vein and including the common femoral, femoral, profunda femoral, popliteal and calf veins including the posterior tibial, peroneal and gastrocnemius veins when visible. The superficial great saphenous vein was also interrogated. Spectral Doppler was utilized to evaluate flow at rest and with distal augmentation maneuvers in the common femoral, femoral and popliteal veins. COMPARISON:  None. FINDINGS: RIGHT LOWER EXTREMITY Common Femoral Vein: No evidence of thrombus. Normal compressibility, respiratory phasicity and response to augmentation. Saphenofemoral Junction: No evidence of thrombus. Normal compressibility and flow on color Doppler imaging. Profunda Femoral Vein: No evidence of thrombus.  Normal compressibility and flow on color Doppler imaging. Femoral Vein: No evidence of thrombus. Normal compressibility, respiratory phasicity and response to augmentation. Popliteal Vein: No evidence of thrombus. Normal compressibility, respiratory phasicity and response to augmentation. Calf Veins: No evidence of thrombus. Normal compressibility and flow on color Doppler imaging. Please note that the right peroneal vein is not visualized. Superficial Great Saphenous Vein: No evidence of thrombus. Normal compressibility and flow on color Doppler imaging. Other Findings:  None. LEFT LOWER EXTREMITY Common Femoral Vein: No evidence of thrombus. Normal compressibility, respiratory phasicity and response to augmentation. Saphenofemoral Junction: No evidence of thrombus. Normal compressibility and flow on color Doppler imaging. Profunda Femoral Vein: No evidence of thrombus. Normal compressibility and flow on color Doppler imaging. Femoral Vein: No evidence of thrombus. Normal compressibility, respiratory phasicity and response to augmentation. Popliteal Vein: No evidence of thrombus. Normal compressibility, respiratory phasicity and response to augmentation. Calf Veins: No evidence of thrombus. Normal compressibility and flow on color Doppler imaging. Please note that the left peroneal vein is incompletely visualized. Superficial Great Saphenous Vein: No evidence of thrombus. Normal compressibility and flow on color Doppler imaging. Other Findings:  None. IMPRESSION: No evidence of DVT within either lower extremity. Electronically Signed   By: Jeannine Boga M.D.   On: 06/24/2016 15:31    EKG:   Orders placed or performed in visit on 03/06/16  . EKG 12-Lead      Management plans discussed with the patient, family and they are in agreement.  CODE STATUS:     Code Status Orders        Start     Ordered  06/24/16 0710  Full code  Continuous     06/24/16 0709    Code Status History    Date  Active Date Inactive Code Status Order ID Comments User Context   11/14/2015  6:33 PM 11/18/2015  6:51 PM Full Code [id number]  Newman Pies, MD Inpatient   09/12/2015 10:48 AM 09/13/2015  3:15 AM Full Code [id number]  Logan Bores, MD HOV    Advance Directive Documentation     Most Recent Value  Type of Advance Directive  Healthcare Power of [place], Living will  Pre-existing out of facility DNR order (yellow form or pink MOST form)  -  "MOST" Form in Place?  -      TOTAL TIME TAKING CARE OF THIS PATIENT: 40 minutes.    Theodoro Grist M.D on 06/25/2016 at 2:15 PM  Between 7am to 6pm - Pager - [phone]  After 6pm go to [email or web address] - password [email or web address] [facility]  Office  [phone]  CC: Primary care physician; Rusty Aus, MD

## 2016-06-25 NOTE — Discharge Instructions (Signed)
Please hold Lasix and potassium supplement due to recent diarrhea/dehydration/nausea until oral intake improves to baseline, thank you. Recommend plenty of oral fluid intake, yogurt.

## 2016-07-23 ENCOUNTER — Emergency Department: Payer: Medicare Other

## 2016-07-23 ENCOUNTER — Inpatient Hospital Stay
Admission: EM | Admit: 2016-07-23 | Discharge: 2016-07-28 | DRG: 291 | Disposition: A | Discharge status: Home Health | Payer: Medicare Other | Attending: Internal Medicine | Admitting: Internal Medicine

## 2016-07-23 ENCOUNTER — Encounter: Payer: Self-pay | Admitting: Emergency Medicine

## 2016-07-23 DIAGNOSIS — N179 Acute kidney failure, unspecified: Secondary | ICD-10-CM | POA: Diagnosis not present

## 2016-07-23 DIAGNOSIS — F329 Major depressive disorder, single episode, unspecified: Secondary | ICD-10-CM | POA: Diagnosis present

## 2016-07-23 DIAGNOSIS — I2510 Atherosclerotic heart disease of native coronary artery without angina pectoris: Secondary | ICD-10-CM | POA: Diagnosis not present

## 2016-07-23 DIAGNOSIS — R0902 Hypoxemia: Secondary | ICD-10-CM

## 2016-07-23 DIAGNOSIS — J441 Chronic obstructive pulmonary disease with (acute) exacerbation: Secondary | ICD-10-CM | POA: Diagnosis present

## 2016-07-23 DIAGNOSIS — E1165 Type 2 diabetes mellitus with hyperglycemia: Secondary | ICD-10-CM | POA: Diagnosis present

## 2016-07-23 DIAGNOSIS — E1151 Type 2 diabetes mellitus with diabetic peripheral angiopathy without gangrene: Secondary | ICD-10-CM | POA: Diagnosis present

## 2016-07-23 DIAGNOSIS — R0603 Acute respiratory distress: Secondary | ICD-10-CM | POA: Diagnosis not present

## 2016-07-23 DIAGNOSIS — Z794 Long term (current) use of insulin: Secondary | ICD-10-CM

## 2016-07-23 DIAGNOSIS — I5043 Acute on chronic combined systolic (congestive) and diastolic (congestive) heart failure: Secondary | ICD-10-CM | POA: Diagnosis present

## 2016-07-23 DIAGNOSIS — R5381 Other malaise: Secondary | ICD-10-CM

## 2016-07-23 DIAGNOSIS — I5033 Acute on chronic diastolic (congestive) heart failure: Secondary | ICD-10-CM | POA: Diagnosis not present

## 2016-07-23 DIAGNOSIS — Z6839 Body mass index (BMI) 39.0-39.9, adult: Secondary | ICD-10-CM | POA: Diagnosis not present

## 2016-07-23 DIAGNOSIS — Z8249 Family history of ischemic heart disease and other diseases of the circulatory system: Secondary | ICD-10-CM | POA: Diagnosis not present

## 2016-07-23 DIAGNOSIS — G4700 Insomnia, unspecified: Secondary | ICD-10-CM | POA: Diagnosis present

## 2016-07-23 DIAGNOSIS — G2581 Restless legs syndrome: Secondary | ICD-10-CM | POA: Diagnosis present

## 2016-07-23 DIAGNOSIS — R000 Tachycardia, unspecified: Secondary | ICD-10-CM | POA: Diagnosis present

## 2016-07-23 DIAGNOSIS — J209 Acute bronchitis, unspecified: Secondary | ICD-10-CM | POA: Diagnosis present

## 2016-07-23 DIAGNOSIS — I872 Venous insufficiency (chronic) (peripheral): Secondary | ICD-10-CM | POA: Diagnosis present

## 2016-07-23 DIAGNOSIS — I5030 Unspecified diastolic (congestive) heart failure: Secondary | ICD-10-CM | POA: Diagnosis not present

## 2016-07-23 DIAGNOSIS — E039 Hypothyroidism, unspecified: Secondary | ICD-10-CM | POA: Diagnosis present

## 2016-07-23 DIAGNOSIS — R55 Syncope and collapse: Secondary | ICD-10-CM | POA: Diagnosis not present

## 2016-07-23 DIAGNOSIS — I119 Hypertensive heart disease without heart failure: Secondary | ICD-10-CM | POA: Diagnosis present

## 2016-07-23 DIAGNOSIS — R0602 Shortness of breath: Secondary | ICD-10-CM | POA: Diagnosis present

## 2016-07-23 DIAGNOSIS — K219 Gastro-esophageal reflux disease without esophagitis: Secondary | ICD-10-CM | POA: Diagnosis present

## 2016-07-23 DIAGNOSIS — E785 Hyperlipidemia, unspecified: Secondary | ICD-10-CM | POA: Diagnosis not present

## 2016-07-23 DIAGNOSIS — Z880 Allergy status to penicillin: Secondary | ICD-10-CM

## 2016-07-23 DIAGNOSIS — Z881 Allergy status to other antibiotic agents status: Secondary | ICD-10-CM

## 2016-07-23 DIAGNOSIS — I110 Hypertensive heart disease with heart failure: Principal | ICD-10-CM | POA: Diagnosis present

## 2016-07-23 DIAGNOSIS — J440 Chronic obstructive pulmonary disease with acute lower respiratory infection: Secondary | ICD-10-CM | POA: Diagnosis present

## 2016-07-23 DIAGNOSIS — Z885 Allergy status to narcotic agent status: Secondary | ICD-10-CM

## 2016-07-23 DIAGNOSIS — Z7982 Long term (current) use of aspirin: Secondary | ICD-10-CM | POA: Diagnosis not present

## 2016-07-23 DIAGNOSIS — G4733 Obstructive sleep apnea (adult) (pediatric): Secondary | ICD-10-CM | POA: Diagnosis present

## 2016-07-23 DIAGNOSIS — J9601 Acute respiratory failure with hypoxia: Secondary | ICD-10-CM | POA: Diagnosis present

## 2016-07-23 LAB — BASIC METABOLIC PANEL
Anion gap: 8 (ref 5–15)
BUN: 19 mg/dL (ref 6–20)
CALCIUM: 9.4 mg/dL (ref 8.9–10.3)
CHLORIDE: 100 mmol/L — AB (ref 101–111)
CO2: 30 mmol/L (ref 22–32)
Creatinine, Ser: 1.05 mg/dL — ABNORMAL HIGH (ref 0.44–1.00)
GFR calc non Af Amer: 52 mL/min — ABNORMAL LOW (ref >60)
Glucose, Bld: 106 mg/dL — ABNORMAL HIGH (ref 65–99)
Potassium: 4 mmol/L (ref 3.5–5.1)
Sodium: 138 mmol/L (ref 135–145)

## 2016-07-23 LAB — CBC
HCT: 38 % (ref 35.0–47.0)
Hemoglobin: 12.9 g/dL (ref 12.0–16.0)
MCH: 28.4 pg (ref 26.0–34.0)
MCHC: 34 g/dL (ref 32.0–36.0)
MCV: 83.6 fL (ref 80.0–100.0)
PLATELETS: 210 10*3/uL (ref 150–440)
RBC: 4.55 MIL/uL (ref 3.80–5.20)
RDW: 15 % — AB (ref 11.5–14.5)
WBC: 8.1 10*3/uL (ref 3.6–11.0)

## 2016-07-23 LAB — GLUCOSE, CAPILLARY
Glucose-Capillary: 98 mg/dL (ref 65–99)
Glucose-Capillary: 169 mg/dL — ABNORMAL HIGH (ref 65–99)

## 2016-07-23 LAB — TROPONIN I

## 2016-07-23 MED ORDER — ASPIRIN EC 81 MG PO TBEC
81.0000 mg | DELAYED_RELEASE_TABLET | Freq: Every day | ORAL | Status: DC
  Administered 2016-07-24 – 2016-07-27 (×4): 81 mg via ORAL
  Filled 2016-07-23 (×6): qty 1

## 2016-07-23 MED ORDER — TRAMADOL HCL 50 MG PO TABS: 50.0000 mg | ORAL_TABLET | Freq: Three times a day (TID) | ORAL | Status: DC | PRN

## 2016-07-23 MED ORDER — ONDANSETRON HCL 4 MG/2ML IJ SOLN: 4.0000 mg | Freq: Four times a day (QID) | INTRAMUSCULAR | Status: DC | PRN

## 2016-07-23 MED ORDER — INSULIN ASPART 100 UNIT/ML ~~LOC~~ SOLN
0.0000 [IU] | Freq: Three times a day (TID) | SUBCUTANEOUS | Status: DC
  Administered 2016-07-24 (×2): 8 [IU] via SUBCUTANEOUS
  Administered 2016-07-24: 15 [IU] via SUBCUTANEOUS
  Administered 2016-07-25 (×2): 5 [IU] via SUBCUTANEOUS
  Administered 2016-07-25: 8 [IU] via SUBCUTANEOUS
  Administered 2016-07-26: 5 [IU] via SUBCUTANEOUS
  Administered 2016-07-26: 3 [IU] via SUBCUTANEOUS
  Administered 2016-07-26 – 2016-07-27 (×2): 5 [IU] via SUBCUTANEOUS
  Administered 2016-07-27 – 2016-07-28 (×3): 3 [IU] via SUBCUTANEOUS
  Administered 2016-07-28: 5 [IU] via SUBCUTANEOUS
  Filled 2016-07-23 (×2): qty 1
  Filled 2016-07-23: qty 8
  Filled 2016-07-23: qty 1
  Filled 2016-07-23: qty 15
  Filled 2016-07-23: qty 8
  Filled 2016-07-23 (×3): qty 1
  Filled 2016-07-23: qty 5
  Filled 2016-07-23 (×3): qty 1
  Filled 2016-07-23: qty 8

## 2016-07-23 MED ORDER — ACETAMINOPHEN 325 MG PO TABS
650.0000 mg | ORAL_TABLET | Freq: Four times a day (QID) | ORAL | Status: DC | PRN
  Administered 2016-07-23: 650 mg via ORAL

## 2016-07-23 MED ORDER — ALBUTEROL SULFATE (2.5 MG/3ML) 0.083% IN NEBU
INHALATION_SOLUTION | RESPIRATORY_TRACT | Status: AC
  Filled 2016-07-23: qty 6

## 2016-07-23 MED ORDER — ENOXAPARIN SODIUM 40 MG/0.4ML ~~LOC~~ SOLN
40.0000 mg | SUBCUTANEOUS | Status: DC
  Administered 2016-07-23 – 2016-07-27 (×5): 40 mg via SUBCUTANEOUS
  Filled 2016-07-23 (×5): qty 0.4

## 2016-07-23 MED ORDER — METHYLPREDNISOLONE SODIUM SUCC 125 MG IJ SOLR
60.0000 mg | Freq: Two times a day (BID) | INTRAMUSCULAR | Status: DC
  Administered 2016-07-23 – 2016-07-26 (×6): 60 mg via INTRAVENOUS
  Filled 2016-07-23 (×5): qty 2

## 2016-07-23 MED ORDER — ONDANSETRON HCL 4 MG PO TABS: 4.0000 mg | ORAL_TABLET | Freq: Four times a day (QID) | ORAL | Status: DC | PRN

## 2016-07-23 MED ORDER — ACETAMINOPHEN 325 MG PO TABS
650.0000 mg | ORAL_TABLET | Freq: Four times a day (QID) | ORAL | Status: DC | PRN
Start: 2016-07-23 — End: 2016-07-28
  Administered 2016-07-26: 650 mg via ORAL
  Filled 2016-07-23: qty 2

## 2016-07-23 MED ORDER — IPRATROPIUM-ALBUTEROL 0.5-2.5 (3) MG/3ML IN SOLN
RESPIRATORY_TRACT | Status: AC
  Filled 2016-07-23: qty 3

## 2016-07-23 MED ORDER — INSULIN ASPART 100 UNIT/ML ~~LOC~~ SOLN
0.0000 [IU] | Freq: Every day | SUBCUTANEOUS | Status: DC
  Administered 2016-07-24 – 2016-07-26 (×3): 4 [IU] via SUBCUTANEOUS
  Administered 2016-07-27: 2 [IU] via SUBCUTANEOUS
  Filled 2016-07-23 (×2): qty 1
  Filled 2016-07-23: qty 8
  Filled 2016-07-23: qty 4
  Filled 2016-07-23: qty 1

## 2016-07-23 MED ORDER — IPRATROPIUM-ALBUTEROL 0.5-2.5 (3) MG/3ML IN SOLN
9.0000 mL | Freq: Once | RESPIRATORY_TRACT | Status: AC
  Administered 2016-07-23: 9 mL via RESPIRATORY_TRACT
  Filled 2016-07-23: qty 9

## 2016-07-23 MED ORDER — METOPROLOL TARTRATE 25 MG PO TABS
ORAL_TABLET | ORAL | Status: AC
  Filled 2016-07-23: qty 1

## 2016-07-23 MED ORDER — POLYETHYLENE GLYCOL 3350 17 G PO PACK: 17.0000 g | PACK | Freq: Every day | ORAL | Status: DC | PRN

## 2016-07-23 MED ORDER — ROPINIROLE HCL 1 MG PO TABS
5.0000 mg | ORAL_TABLET | Freq: Two times a day (BID) | ORAL | Status: DC
  Administered 2016-07-23 – 2016-07-28 (×10): 5 mg via ORAL
  Filled 2016-07-23 (×11): qty 5

## 2016-07-23 MED ORDER — SODIUM CHLORIDE 0.9 % IV BOLUS (SEPSIS)
500.0000 mL | Freq: Once | INTRAVENOUS | Status: AC
  Administered 2016-07-23: 500 mL via INTRAVENOUS

## 2016-07-23 MED ORDER — INSULIN GLARGINE 100 UNIT/ML ~~LOC~~ SOLN
30.0000 [IU] | Freq: Every day | SUBCUTANEOUS | Status: DC
  Administered 2016-07-24 – 2016-07-25 (×2): 30 [IU] via SUBCUTANEOUS
  Filled 2016-07-23 (×2): qty 0.3

## 2016-07-23 MED ORDER — ACETAMINOPHEN 650 MG RE SUPP: 650.0000 mg | Freq: Four times a day (QID) | RECTAL | Status: DC | PRN

## 2016-07-23 MED ORDER — ALBUTEROL SULFATE (2.5 MG/3ML) 0.083% IN NEBU
2.5000 mg | INHALATION_SOLUTION | RESPIRATORY_TRACT | Status: DC | PRN
  Administered 2016-07-26: 2.5 mg via RESPIRATORY_TRACT

## 2016-07-23 MED ORDER — HYOSCYAMINE SULFATE 0.125 MG SL SUBL
0.2500 mg | SUBLINGUAL_TABLET | SUBLINGUAL | Status: DC | PRN
  Filled 2016-07-23: qty 2

## 2016-07-23 MED ORDER — GABAPENTIN 300 MG PO CAPS
300.0000 mg | ORAL_CAPSULE | Freq: Four times a day (QID) | ORAL | Status: DC
  Administered 2016-07-23 – 2016-07-28 (×19): 300 mg via ORAL
  Filled 2016-07-23 (×20): qty 1

## 2016-07-23 MED ORDER — RISAQUAD PO CAPS
1.0000 | ORAL_CAPSULE | Freq: Every day | ORAL | Status: DC
  Administered 2016-07-24 – 2016-07-28 (×5): 1 via ORAL
  Filled 2016-07-23 (×6): qty 1

## 2016-07-23 MED ORDER — METOPROLOL TARTRATE 25 MG PO TABS
25.0000 mg | ORAL_TABLET | Freq: Two times a day (BID) | ORAL | Status: DC
  Administered 2016-07-23 – 2016-07-26 (×6): 25 mg via ORAL
  Filled 2016-07-23 (×8): qty 1

## 2016-07-23 MED ORDER — TEMAZEPAM 15 MG PO CAPS
30.0000 mg | ORAL_CAPSULE | Freq: Every day | ORAL | Status: DC
  Administered 2016-07-23 – 2016-07-27 (×5): 30 mg via ORAL
  Filled 2016-07-23 (×5): qty 2

## 2016-07-23 MED ORDER — IPRATROPIUM-ALBUTEROL 0.5-2.5 (3) MG/3ML IN SOLN
3.0000 mL | RESPIRATORY_TRACT | Status: DC
  Administered 2016-07-23 – 2016-07-28 (×27): 3 mL via RESPIRATORY_TRACT
  Filled 2016-07-23 (×25): qty 3

## 2016-07-23 MED ORDER — HYDROXYZINE HCL 25 MG PO TABS
50.0000 mg | ORAL_TABLET | Freq: Two times a day (BID) | ORAL | Status: DC
  Administered 2016-07-23 – 2016-07-28 (×10): 50 mg via ORAL
  Filled 2016-07-23 (×11): qty 2

## 2016-07-23 MED ORDER — PANTOPRAZOLE SODIUM 40 MG PO TBEC
40.0000 mg | DELAYED_RELEASE_TABLET | Freq: Every day | ORAL | Status: DC
  Administered 2016-07-23 – 2016-07-28 (×6): 40 mg via ORAL
  Filled 2016-07-23 (×7): qty 1

## 2016-07-23 MED ORDER — ALBUTEROL SULFATE (2.5 MG/3ML) 0.083% IN NEBU
5.0000 mg | INHALATION_SOLUTION | Freq: Once | RESPIRATORY_TRACT | Status: AC
  Administered 2016-07-23: 5 mg via RESPIRATORY_TRACT

## 2016-07-23 MED ORDER — METHYLPREDNISOLONE SODIUM SUCC 125 MG IJ SOLR
INTRAMUSCULAR | Status: AC
  Filled 2016-07-23: qty 2

## 2016-07-23 MED ORDER — ACETAMINOPHEN 325 MG PO TABS
ORAL_TABLET | ORAL | Status: AC
  Filled 2016-07-23: qty 2

## 2016-07-23 MED ORDER — LEVOTHYROXINE SODIUM 50 MCG PO TABS
75.0000 ug | ORAL_TABLET | Freq: Every day | ORAL | Status: DC
  Administered 2016-07-24 – 2016-07-28 (×5): 75 ug via ORAL
  Filled 2016-07-23: qty 1
  Filled 2016-07-23: qty 1.5
  Filled 2016-07-23 (×2): qty 2
  Filled 2016-07-23: qty 1.5
  Filled 2016-07-23 (×2): qty 2

## 2016-07-23 NOTE — ED Notes (Signed)
Pt speaking in complete sentences, currently on bipap. Pt states SOB x 2 weeks. Pt is alert and oriented x 4. No distress noted currently.

## 2016-07-23 NOTE — ED Notes (Signed)
Pt oxygen hovering between 90-92% room air, placed on 2 L nasal cannula. Came up to 95%. Will continue to monitor.

## 2016-07-23 NOTE — ED Triage Notes (Signed)
Pt brought over from [facility] for shortness of breath. Pt was given kenalog at [facility], nurse reports pt's oxygen sat dropped to 70% when ambulated, HR up to 160s. Nurse reports pt's oxygen sat is 95% now.

## 2016-07-23 NOTE — ED Notes (Signed)
Pt taken off bipap at this time. Maintaining oxygen levels 94-95% on room air.

## 2016-07-23 NOTE — H&P (Signed)
[facility] NAME: Dashonna Chagnon    MR#:  [id number]  DATE OF BIRTH:  [birth year]  DATE OF ADMISSION:  07/23/2016  PRIMARY CARE PHYSICIAN: Rusty Aus, MD   REQUESTING/REFERRING PHYSICIAN: Dr. Lucita Lora  CHIEF COMPLAINT:   Chief Complaint  Patient presents with  . Shortness of Breath    HISTORY OF PRESENT ILLNESS:  Lilya Smitherman  is a 71 y.o. female with a known history of CAD, HTN, DM here from [facility] clinic with 3 days of wheezing, SOB and cough. Had tachycardia and sats in 70s at [facility] clinic and sent to ED. Her patient has been placed on Bipap.  Green sputum, subjective fever at home with chills. CXR shows no PNA or CHF  PAST MEDICAL HISTORY:   Past Medical History:  Diagnosis Date  . Anxiety   . Arthritis   . Chronic back pain    stenosis.degenerative disc,some scoliosis  . Constipation    takes Stool Softener daily  . Coronary artery disease   . Depression    takes Cymbalta daily  . Diabetes mellitus    Type 2 diabetic. Average fasting blood sugar runs high 170-200  . Diverticulosis   . E coli infection   . GERD (gastroesophageal reflux disease)    takes Nexium daily  . Headache   . Headache 02/16/2016  . Hemorrhoids   . History of colon polyps    benign  . History of gout    doesn't take any meds  . History of hiatal hernia   . History of vertigo    doesn't take any meds  . Hyperlipidemia    takes Praluent daily  . Hypertension    currently BP medications are on hold   . Hypothyroidism    takes Synthroid daily  . Insomnia    takes Restoril nightly  . Joint pain   . Joint swelling   . Muscle spasm    takes Robaxin as needed  . OSA on CPAP   . Periodic heart flutter   . Peripheral edema    takes LAsix as needed  . Peripheral vascular disease ([place])    AAA as stated per pt / was just discovered and pt states has not been referred to vascular MD   . Restless leg    takes Requip daily  . Rosacea   .  Sleep apnea   . Urinary incontinence   . Urinary urgency   . Varicose veins   . Weakness    numbness and tingling.    PAST SURGICAL HISTORY:   Past Surgical History:  Procedure Laterality Date  . ABDOMINAL HYSTERECTOMY     with BSo  . CARDIAC CATHETERIZATION  2013   Normal  . CARPAL TUNNEL RELEASE Bilateral   . CATARACT EXTRACTION, BILATERAL    . CHOLECYSTECTOMY    . COLONOSCOPY    . DIAGNOSTIC LAPAROSCOPY     multiple times  . DILATION AND CURETTAGE OF UTERUS    . ESOPHAGOGASTRODUODENOSCOPY (EGD) WITH PROPOFOL N/A 11/01/2014   Procedure: ESOPHAGOGASTRODUODENOSCOPY (EGD) WITH PROPOFOL;  Surgeon: Hulen Luster, MD;  Location: [facility] ENDOSCOPY;  Service: Gastroenterology;  Laterality: N/A;  . IMPLANTABLE CONTACT LENS IMPLANTATION     bilateral  . LAMINECTOMY  11/13/2015  . LUMBAR WOUND DEBRIDEMENT N/A 12/02/2015   Procedure: WOUND Exploration;  Surgeon: Consuella Lose, MD;  Location: [place];  Service: Neurosurgery;  Laterality: N/A;  . PICC LINE PLACE PERIPHERAL ([place] HX)  right upper arm   . ROTATOR CUFF REPAIR Left   . SAVORY DILATION N/A 11/01/2014   Procedure: SAVORY DILATION;  Surgeon: Hulen Luster, MD;  Location: [facility] ENDOSCOPY;  Service: Gastroenterology;  Laterality: N/A;  . TONSILLECTOMY    . TRIGGER FINGER RELEASE Bilateral     SOCIAL HISTORY:   Social History  Substance Use Topics  . Smoking status: Never Smoker  . Smokeless tobacco: Never Used  . Alcohol use No    FAMILY HISTORY:   Family History  Problem Relation Age of Onset  . Heart disease Mother   . Breast cancer Mother 32  . Heart disease Father   . Heart attack Sister   . Heart disease Sister   . Heart disease Brother     DRUG ALLERGIES:   Allergies  Allergen Reactions  . Ceftin [Cefuroxime Axetil] Diarrhea    diarrhea  . Codeine Rash       . Penicillins Rash    Has patient had a PCN reaction causing immediate rash, facial/tongue/throat swelling, SOB or lightheadedness with hypotension:  YES Has patient had a PCN reaction causing severe rash involving mucus membranes or skin necrosis: NO Has patient had a PCN retioion that required hospitalization NO Has patient had a PCN reaction occurring within the last 10 years: YES If all of the above answers are "NO", then may proceed with Cephalosporin use.  . Vicodin [Hydrocodone-Acetaminophen] Rash     Also passes out    REVIEW OF SYSTEMS:   Review of Systems  Constitutional: Positive for malaise/fatigue. Negative for chills, fever and weight loss.  HENT: Negative for hearing loss and nosebleeds.   Eyes: Negative for blurred vision, double vision and pain.  Respiratory: Negative for cough, hemoptysis, sputum production, shortness of breath and wheezing.   Cardiovascular: Negative for chest pain, palpitations, orthopnea and leg swelling.  Gastrointestinal: Negative for abdominal pain, constipation, diarrhea, nausea and vomiting.  Genitourinary: Negative for dysuria and hematuria.  Musculoskeletal: Negative for back pain, falls and myalgias.  Skin: Negative for rash.  Neurological: Negative for dizziness, tremors, sensory change, speech change, focal weakness, seizures and headaches.  Endo/Heme/Allergies: Does not bruise/bleed easily.  Psychiatric/Behavioral: Negative for depression and memory loss. The patient is not nervous/anxious.     MEDICATIONS AT HOME:   Prior to Admission medications   Medication Sig Start Date End Date Taking? Authorizing Provider  acidophilus (RISAQUAD) CAPS capsule Take 1 capsule by mouth daily. 06/25/16  Yes Theodoro Grist, MD  Alirocumab (PRALUENT) 150 MG/ML SOPN Inject 1 pen into the skin every 14 (fourteen) days. 01/12/16  Yes Minna Merritts, MD  aspirin 81 MG tablet Take 81 mg by mouth daily.     Yes [provider]  cholecalciferol (VITAMIN D) 1000 UNITS tablet Take 1,000 Units by mouth daily.   Yes [provider]  desonide (DESOWEN) 0.05 % cream Apply 1 application  topically 2 (two) times daily.   Yes [provider]  esomeprazole (NEXIUM) 40 MG capsule Take 40 mg by mouth 3 (three) times daily.    Yes [provider]  gabapentin (NEURONTIN) 300 MG capsule Take 300 mg by mouth 4 (four) times daily.    Yes [provider]  hydrOXYzine (ATARAX/VISTARIL) 50 MG tablet Take 50 mg by mouth 2 (two) times daily.  05/26/12  Yes [provider]  hyoscyamine (LEVSIN SL) 0.125 MG SL tablet Place 2 tablets (0.25 mg total) under the tongue every 4 (four) hours as needed for cramping. 06/25/16  Yes Theodoro Grist, MD  insulin aspart (NOVOLOG) 100 UNIT/ML FlexPen Inject 10 Units into the skin See admin instructions. 10 units before breakfast, 10 units before lunch, 16 units at dinner, >200 increase by 2 units per sliding scale 03/01/15 07/23/17 Yes [provider]  insulin glargine (LANTUS) 100 UNIT/ML injection Inject 30 Units into the skin daily. Take every morning    Yes [provider]  levothyroxine (SYNTHROID, LEVOTHROID) 75 MCG tablet Take 75 mcg by mouth daily before breakfast.  11/24/12  Yes [provider]  lisinopril (PRINIVIL,ZESTRIL) 10 MG tablet Take 10 mg by mouth daily.   Yes [provider]  metoprolol succinate (TOPROL XL) 25 MG 24 hr tablet Take 1 tablet (25 mg total) by mouth daily. 03/06/16  Yes Gollan, Kathlene November, MD  nitroGLYCERIN (NITROSTAT) 0.4 MG SL tablet Place 1 tablet (0.4 mg total) under the tongue every 5 (five) minutes as needed for chest pain. 03/06/16 09/18/17 Yes Gollan, Kathlene November, MD  ondansetron (ZOFRAN) 4 MG tablet Take 1 tablet (4 mg total) by mouth every 6 (six) hours as needed for nausea. 06/25/16  Yes Theodoro Grist, MD  ropinirole (REQUIP) 5 MG tablet Take 5 mg by mouth 2 (two) times daily.   Yes [provider]  traMADol (ULTRAM) 50 MG tablet Take 50 mg by mouth 3 (three) times daily as needed for moderate pain.   Yes [provider]  isosorbide mononitrate  (IMDUR) 30 MG 24 hr tablet Take 30 mg by mouth as needed. 07/25/11 09/08/15  Minna Merritts, MD     VITAL SIGNS:  Blood pressure (!) 181/82, pulse (!) 121, temperature 100.2 F (37.9 C), temperature source Oral, resp. rate 11, height 5\' 3"  (1.6 m), weight 101.6 kg (224 lb), SpO2 100 %.  PHYSICAL EXAMINATION:  Physical Exam  GENERAL:  71 y.o.-year-old patient lying in the bed . Obese and in resp distress. EYES: Pupils equal, round, reactive to light and accommodation. No scleral icterus. Extraocular muscles intact.  HEENT: Head atraumatic, normocephalic. Oropharynx and nasopharynx clear. No oropharyngeal erythema, moist oral mucosa  NECK:  Supple, no jugular venous distention. No thyroid enlargement, no tenderness.  LUNGS: Bilateral wheezing. CARDIOVASCULAR: S1, S2 normal. No murmurs, rubs, or gallops.  ABDOMEN: Soft, nontender, nondistended. Bowel sounds present. No organomegaly or mass.  EXTREMITIES: No pedal edema, cyanosis, or clubbing. + 2 pedal & radial pulses b/l.   NEUROLOGIC: Cranial nerves II through XII are intact. No focal Motor or sensory deficits appreciated b/l PSYCHIATRIC: The patient is alert and oriented x 3. SKIN: No obvious rash, lesion, or ulcer.   LABORATORY PANEL:   CBC  Recent Labs Lab 07/23/16 1556  WBC 8.1  HGB 12.9  HCT 38.0  PLT 210   ------------------------------------------------------------------------------------------------------------------  Chemistries   Recent Labs Lab 07/23/16 1556  NA 138  K 4.0  CL 100*  CO2 30  GLUCOSE 106*  BUN 19  CREATININE 1.05*  CALCIUM 9.4   ------------------------------------------------------------------------------------------------------------------  Cardiac Enzymes  Recent Labs Lab 07/23/16 1556  TROPONINI <0.03   ------------------------------------------------------------------------------------------------------------------  RADIOLOGY:  Dg Chest 2 View  Result Date:  07/23/2016 CLINICAL DATA:  Shortness of breath for 2 weeks. Productive cough for 2 days. EXAM: CHEST  2 VIEW COMPARISON:  Chest x-ray dated 07/23/2011. FINDINGS: Heart size and mediastinal contours are within normal limits. Chronic bronchitic changes noted centrally. No confluent opacity to suggest a developing pneumonia. No pleural effusion or pneumothorax seen. Osseous and soft tissue structures about the chest are unremarkable. IMPRESSION:  1. No active cardiopulmonary disease. No evidence of pneumonia or pulmonary edema. 2. Chronic bronchitic changes centrally. Electronically Signed   By: Franki Cabot M.D.   On: 07/23/2016 16:50   IMPRESSION AND PLAN:   * Acute Bronchitis with acute hypoxic respiratory failure -IV steroids, Antibiotics - Scheduled Nebulizers - Inhalers Removed patient off bipap. Sats 94% on RA  * Sinus tachycardia due to resp distress Give patient PO metoprolol now  * DM Continue home insulin and SSI  * HTN Continue home meds  * DVT prophylaxis lovenox   All the records are reviewed and case discussed with ED provider. Management plans discussed with the patient, family and they are in agreement.  CODE STATUS: FULL CODE  TOTAL TIME TAKING CARE OF THIS PATIENT: 40 minutes.   Hillary Bow R M.D on 07/23/2016 at 7:43 PM  Between 7am to 6pm - Pager - [phone]  After 6pm go to [email or web address] - password [email or web address] [facility]  Office  [phone]  CC: Primary care physician; Rusty Aus, MD  Note: This dictation was prepared with Dragon dictation along with smaller phrase technology. Any transcriptional errors that result from this process are unintentional.

## 2016-07-23 NOTE — ED Notes (Signed)
Pt states SOB x 2 weeks, coughing x few days, productive.

## 2016-07-23 NOTE — ED Notes (Signed)
Pt states she is feeling better and breathing easier. Dr. Clearnce Hasten and Dr. Darvin Neighbours notified. They both agree to trial pt off of bipap.

## 2016-07-23 NOTE — ED Triage Notes (Signed)
Pt reports increasing shortness of breath over past two weeks. Pt reports syncopal episode at the beach one week ago. Pt with audible wheezing in triage. Pt speaking in complete sentences without difficulty.

## 2016-07-23 NOTE — ED Provider Notes (Signed)
[facility] Emergency Department Provider Note  ____________________________________________   First MD Initiated Contact with Patient 07/23/16 1830     (approximate)  I have reviewed the triage vital signs and the nursing notes.   HISTORY  Chief Complaint Shortness of Breath   HPI [name] is a 71 y.o. female who is presenting with shortness of breath since this past Saturday. She says that she was at the beach when she started having shortness of breath and a cough. She says that the cough has been productive of green and yellow sputum. Does not report fever. Says that she was so short of breath on the beach this past Saturday she passed out for a "brief period of time." She says that she did not seek health care provider at that time because of insurance issues and being which she thought may have been out of network. However, today she went to the [place] clinic where she was found to be tachycardic when ambulating as well as descending down to 70%. She was given 1 ibuprofen treatment in triage which helped minimally.   Past Medical History:  Diagnosis Date  . Anxiety   . Arthritis   . Chronic back pain    stenosis.degenerative disc,some scoliosis  . Constipation    takes Stool Softener daily  . Coronary artery disease   . Depression    takes Cymbalta daily  . Diabetes mellitus    Type 2 diabetic. Average fasting blood sugar runs high 170-200  . Diverticulosis   . E coli infection   . GERD (gastroesophageal reflux disease)    takes Nexium daily  . Headache   . Headache 02/16/2016  . Hemorrhoids   . History of colon polyps    benign  . History of gout    doesn't take any meds  . History of hiatal hernia   . History of vertigo    doesn't take any meds  . Hyperlipidemia    takes Praluent daily  . Hypertension    currently BP medications are on hold   . Hypothyroidism    takes Synthroid daily  . Insomnia    takes Restoril nightly    . Joint pain   . Joint swelling   . Muscle spasm    takes Robaxin as needed  . OSA on CPAP   . Periodic heart flutter   . Peripheral edema    takes LAsix as needed  . Peripheral vascular disease ([place])    AAA as stated per pt / was just discovered and pt states has not been referred to vascular MD   . Restless leg    takes Requip daily  . Rosacea   . Sleep apnea   . Urinary incontinence   . Urinary urgency   . Varicose veins   . Weakness    numbness and tingling.    Patient Active Problem List   Diagnosis Date Noted  . Viral gastroenteritis 06/25/2016  . Pyuria 06/25/2016  . Acute renal insufficiency 06/25/2016  . Abdominal pain 06/24/2016  . Headache 02/16/2016  . E coli infection   . Abscess of back   . Sepsis due to Escherichia coli (E. coli) ([place])   . 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 Wound infection after surgery 12/02/2015  . Lumbar degenerative disc disease 11/14/2015  . Itching 02/24/2014  . Anxiety 02/24/2014  . Fatigue 10/23/2013  . Chest pain 06/09/2012  . CAD (coronary artery disease) 06/09/2012  . Hyperlipidemia 08/09/2011  .  Arm pain 06/28/2011  . Tachycardia 05/05/2010  . Diabetes mellitus ([place]) 05/05/2010  . OSA on CPAP 05/05/2010  . HTN (hypertension) 05/05/2010  . Edema 05/05/2010  . HYPERTRIGLYCERIDEMIA 06/08/2008  . DYSPNEA 06/08/2008    Past Surgical History:  Procedure Laterality Date  . ABDOMINAL HYSTERECTOMY     with BSo  . CARDIAC CATHETERIZATION  2013   Normal  . CARPAL TUNNEL RELEASE Bilateral   . CATARACT EXTRACTION, BILATERAL    . CHOLECYSTECTOMY    . COLONOSCOPY    . DIAGNOSTIC LAPAROSCOPY     multiple times  . DILATION AND CURETTAGE OF UTERUS    . ESOPHAGOGASTRODUODENOSCOPY (EGD) WITH PROPOFOL N/A 11/01/2014   Procedure: ESOPHAGOGASTRODUODENOSCOPY (EGD) WITH PROPOFOL;  Surgeon: Hulen Luster, MD;  Location: [facility] ENDOSCOPY;  Service: Gastroenterology;  Laterality: N/A;  . IMPLANTABLE CONTACT LENS IMPLANTATION      bilateral  . LAMINECTOMY  11/13/2015  . LUMBAR WOUND DEBRIDEMENT N/A 12/02/2015   Procedure: WOUND Exploration;  Surgeon: Consuella Lose, MD;  Location: [place];  Service: Neurosurgery;  Laterality: N/A;  . PICC LINE PLACE PERIPHERAL ([place] HX)     right upper arm   . ROTATOR CUFF REPAIR Left   . SAVORY DILATION N/A 11/01/2014   Procedure: SAVORY DILATION;  Surgeon: Hulen Luster, MD;  Location: [facility] ENDOSCOPY;  Service: Gastroenterology;  Laterality: N/A;  . TONSILLECTOMY    . TRIGGER FINGER RELEASE Bilateral     Prior to Admission medications   Medication Sig Start Date End Date Taking? Authorizing Provider  acidophilus (RISAQUAD) CAPS capsule Take 1 capsule by mouth daily. 06/25/16   Theodoro Grist, MD  Alirocumab (PRALUENT) 150 MG/ML SOPN Inject 1 pen into the skin every 14 (fourteen) days. 01/12/16   Minna Merritts, MD  aspirin 81 MG tablet Take 81 mg by mouth daily.      [provider]  cholecalciferol (VITAMIN D) 1000 UNITS tablet Take 1,000 Units by mouth daily.    [provider]  desonide (DESOWEN) 0.05 % cream Apply 1 application topically 2 (two) times daily.    [provider]  DULoxetine (CYMBALTA) 60 MG capsule Take 60 mg by mouth daily.    [provider]  esomeprazole (NEXIUM) 40 MG capsule Take 40 mg by mouth 3 (three) times daily.     [provider]  gabapentin (NEURONTIN) 300 MG capsule Take 300 mg by mouth 4 (four) times daily.     [provider]  hydrOXYzine (ATARAX/VISTARIL) 50 MG tablet Take 50 mg by mouth 2 (two) times daily.  05/26/12   [provider]  hyoscyamine (LEVSIN SL) 0.125 MG SL tablet Place 2 tablets (0.25 mg total) under the tongue every 4 (four) hours as needed for cramping. 06/25/16   Theodoro Grist, MD  insulin aspart (NOVOLOG) 100 UNIT/ML FlexPen Inject 10 Units into the skin See admin instructions. 10 units before breakfast, 10 units before lunch, 16 units at dinner, >200 increase by 2  units per sliding scale 03/01/15 06/24/16  [provider]  insulin glargine (LANTUS) 100 UNIT/ML injection Inject 30 Units into the skin daily. Take every morning     [provider]  isosorbide mononitrate (IMDUR) 30 MG 24 hr tablet Take 30 mg by mouth as needed. 07/25/11 09/08/15  Minna Merritts, MD  levothyroxine (SYNTHROID, LEVOTHROID) 75 MCG tablet Take 75 mcg by mouth daily before breakfast.  11/24/12   [provider]  methocarbamol (ROBAXIN) 750 MG tablet Take 750 mg by mouth 3 (  three) times daily as needed for muscle spasms.     [provider]  metoprolol succinate (TOPROL XL) 25 MG 24 hr tablet Take 1 tablet (25 mg total) by mouth daily. 03/06/16   Minna Merritts, MD  nitroGLYCERIN (NITROSTAT) 0.4 MG SL tablet Place 1 tablet (0.4 mg total) under the tongue every 5 (five) minutes as needed for chest pain. 03/06/16 09/18/17  Minna Merritts, MD  ondansetron (ZOFRAN) 4 MG tablet Take 1 tablet (4 mg total) by mouth every 6 (six) hours as needed for nausea. 06/25/16   Theodoro Grist, MD  ropinirole (REQUIP) 5 MG tablet Take 5 mg by mouth 2 (two) times daily.    [provider]  temazepam (RESTORIL) 30 MG capsule Take 30 mg by mouth at bedtime.    [provider]  traMADol (ULTRAM) 50 MG tablet Take 50 mg by mouth 3 (three) times daily as needed for moderate pain.    [provider]    Allergies Ceftin [cefuroxime axetil]; Codeine; Penicillins; and Vicodin [hydrocodone-acetaminophen]  Family History  Problem Relation Age of Onset  . Heart disease Mother   . Breast cancer Mother 61  . Heart disease Father   . Heart attack Sister   . Heart disease Sister   . Heart disease Brother     Social History Social History  Substance Use Topics  . Smoking status: Never Smoker  . Smokeless tobacco: Never Used  . Alcohol use No    Review of Systems  Constitutional: No fever/chills Eyes: No visual changes. ENT: No sore  throat. Cardiovascular: Denies chest pain. Respiratory: as above Gastrointestinal: No abdominal pain.  No nausea, no vomiting.  No diarrhea.  No constipation. Genitourinary: Negative for dysuria. Musculoskeletal: Negative for back pain. Skin: Negative for rash. Neurological: Negative for headaches, focal weakness or numbness.   ____________________________________________   PHYSICAL EXAM:  VITAL SIGNS: ED Triage Vitals  Enc Vitals Group     BP 07/23/16 1549 (!) 168/56     Pulse Rate 07/23/16 1549 (!) 120     Resp 07/23/16 1549 (!) 22     Temp 07/23/16 1549 100.2 F (37.9 C)     Temp Source 07/23/16 1549 Oral     SpO2 07/23/16 1549 97 %     Weight 07/23/16 1558 224 lb (101.6 kg)     Height 07/23/16 1558 5\' 3"  (1.6 m)     Head Circumference --      Peak Flow --      Pain Score --      Pain Loc --      Pain Edu? --      Excl. in [place]? --     Constitutional: Alert and oriented.  Eyes: Conjunctivae are normal.  Head: Atraumatic. Nose: No congestion/rhinnorhea. Mouth/Throat: Mucous membranes are moist.  Neck: No stridor.   Cardiovascular:  tachycardic, regular rhythm. Grossly normal heart sounds.   Respiratory: Tachypneic with accessory muscle use. Speaks in only 2-3 word sentences. Decreased air movement throughout with wheezing throughout and a prolonged expiratory phase. Gastrointestinal: Soft and nontender. No distention. No CVA tenderness. Musculoskeletal: No lower extremity tenderness nor edema.  No joint effusions. Neurologic:  Normal speech and language. No gross focal neurologic deficits are appreciated. Skin:  Skin is warm, dry and intact. No rash noted. Psychiatric: Mood and affect are normal. Speech and behavior are normal.  ____________________________________________   LABS (all labs ordered are listed, but only abnormal results are displayed)  Labs Reviewed  BASIC METABOLIC  PANEL - Abnormal; Notable for the following:       Result Value   Chloride 100  (*)    Glucose, Bld 106 (*)    Creatinine, Ser 1.05 (*)    GFR calc non Af Amer 52 (*)    All other components within normal limits  CBC - Abnormal; Notable for the following:    RDW 15.0 (*)    All other components within normal limits  TROPONIN I  GLUCOSE, CAPILLARY   ____________________________________________  EKG  ED ECG REPORT I, Doran Stabler, the attending physician, personally viewed and interpreted this ECG.   Date: 07/23/2016  EKG Time: 1551  Rate: 121  Rhythm: sinus tachycardia  Axis: Normal  Intervals:none  ST&T Change: No ST segment elevation or depression. No abnormal T-wave inversion.  ____________________________________________  RADIOLOGY  Chronic bronchitis on the chest x-ray ____________________________________________   PROCEDURES  Procedure(s) performed:    Procedures  Critical Care performed:  CRITICAL CARE Performed by: Doran Stabler   Total critical care time: 35 minutes  Critical care time was exclusive of separately billable procedures and treating other patients.  Critical care was necessary to treat or prevent imminent or life-threatening deterioration.  Critical care was time spent personally by me on the following activities: development of treatment plan with patient and/or surrogate as well as nursing, discussions with consultants, evaluation of patient's response to treatment, examination of patient, obtaining history from patient or surrogate, ordering and performing treatments and interventions, ordering and review of laboratory studies, ordering and review of radiographic studies, pulse oximetry and re-evaluation of patient's condition.   ____________________________________________   INITIAL IMPRESSION / ASSESSMENT AND PLAN / ED COURSE  Pertinent labs & imaging results that were available during my care of the patient were reviewed by me and considered in my medical decision making (see chart for  details).  ----------------------------------------- 6:46 PM on 07/23/2016 -----------------------------------------  Patient with increased work of breathing. We'll placed on BiPAP and admitted to the hospital. Signed out to Dr. Darvin Neighbours. The patient is aware of the plan and willing to comply. Likely simple episode secondary to shortness of breath and possible hypoxia this past Saturday.      ____________________________________________   FINAL CLINICAL IMPRESSION(S) / ED DIAGNOSES  COPD exacerbation. Hypoxia. Syncope.    NEW MEDICATIONS STARTED DURING THIS VISIT:  New Prescriptions   No medications on file     Note:  This document was prepared using Dragon voice recognition software and may include unintentional dictation errors.     Orbie Pyo, MD 07/23/16 [phone]

## 2016-07-24 LAB — CBC
HCT: 35 % (ref 35.0–47.0)
HEMOGLOBIN: 12 g/dL (ref 12.0–16.0)
MCH: 29.2 pg (ref 26.0–34.0)
MCHC: 34.2 g/dL (ref 32.0–36.0)
MCV: 85.3 fL (ref 80.0–100.0)
Platelets: 181 10*3/uL (ref 150–440)
RBC: 4.11 MIL/uL (ref 3.80–5.20)
RDW: 14.8 % — ABNORMAL HIGH (ref 11.5–14.5)
WBC: 6 10*3/uL (ref 3.6–11.0)

## 2016-07-24 LAB — GLUCOSE, CAPILLARY
GLUCOSE-CAPILLARY: 362 mg/dL — AB (ref 65–99)
GLUCOSE-CAPILLARY: 289 mg/dL — AB (ref 65–99)
GLUCOSE-CAPILLARY: 306 mg/dL — AB (ref 65–99)
Glucose-Capillary: 271 mg/dL — ABNORMAL HIGH (ref 65–99)

## 2016-07-24 LAB — BASIC METABOLIC PANEL
ANION GAP: 8 (ref 5–15)
BUN: 22 mg/dL — ABNORMAL HIGH (ref 6–20)
CALCIUM: 8.9 mg/dL (ref 8.9–10.3)
CO2: 28 mmol/L (ref 22–32)
Chloride: 103 mmol/L (ref 101–111)
Creatinine, Ser: 1.18 mg/dL — ABNORMAL HIGH (ref 0.44–1.00)
GFR, EST AFRICAN AMERICAN: 52 mL/min — AB (ref >60)
GFR, EST NON AFRICAN AMERICAN: 45 mL/min — AB (ref >60)
Glucose, Bld: 366 mg/dL — ABNORMAL HIGH (ref 65–99)
Potassium: 4.8 mmol/L (ref 3.5–5.1)
SODIUM: 139 mmol/L (ref 135–145)

## 2016-07-24 MED ORDER — GUAIFENESIN-DM 100-10 MG/5ML PO SYRP
5.0000 mL | ORAL_SOLUTION | ORAL | Status: DC | PRN
  Administered 2016-07-24 – 2016-07-27 (×4): 5 mL via ORAL
  Filled 2016-07-24 (×4): qty 5

## 2016-07-24 MED ORDER — BENZONATATE 100 MG PO CAPS
100.0000 mg | ORAL_CAPSULE | Freq: Two times a day (BID) | ORAL | Status: DC | PRN
  Administered 2016-07-24: 100 mg via ORAL
  Filled 2016-07-24: qty 1

## 2016-07-24 MED ORDER — INSULIN ASPART 100 UNIT/ML ~~LOC~~ SOLN
5.0000 [IU] | Freq: Three times a day (TID) | SUBCUTANEOUS | Status: DC
  Administered 2016-07-24 – 2016-07-25 (×3): 5 [IU] via SUBCUTANEOUS
  Filled 2016-07-24 (×3): qty 5

## 2016-07-24 NOTE — Progress Notes (Signed)
[facility] [place] NAME: [name]    MR#:  [id number]  DATE OF BIRTH:  Jul 02, 1945  SUBJECTIVE: Admitted for COPD exacerbation. She feels slightly better than yesterday. Has some cough and phlegm still short of breath on exertion.   CHIEF COMPLAINT:   Chief Complaint  Patient presents with  . Shortness of Breath    REVIEW OF SYSTEMS:   ROS CONSTITUTIONAL: No fever, fatigue or weakness.  EYES: No blurred or double vision.  EARS, NOSE, AND THROAT: No tinnitus or ear pain.  RESPIRATORY: Cough, shortness of breath. CARDIOVASCULAR: No chest pain, orthopnea, edema.  GASTROINTESTINAL: No nausea, vomiting, diarrhea or abdominal pain.  GENITOURINARY: No dysuria, hematuria.  ENDOCRINE: No polyuria, nocturia,  HEMATOLOGY: No anemia, easy bruising or bleeding SKIN: No rash or lesion. MUSCULOSKELETAL: No joint pain or arthritis.   NEUROLOGIC: No tingling, numbness, weakness.  PSYCHIATRY: No anxiety or depression.   DRUG ALLERGIES:   Allergies  Allergen Reactions  . Ceftin [Cefuroxime Axetil] Diarrhea    diarrhea  . Codeine Rash       . Penicillins Rash    Has patient had a PCN reaction causing immediate rash, facial/tongue/throat swelling, SOB or lightheadedness with hypotension: YES Has patient had a PCN reaction causing severe rash involving mucus membranes or skin necrosis: NO Has patient had a PCN retioion that required hospitalization NO Has patient had a PCN reaction occurring within the last 10 years: YES If all of the above answers are "NO", then may proceed with Cephalosporin use.  . Vicodin [Hydrocodone-Acetaminophen] Rash     Also passes out    VITALS:  Blood pressure (!) 113/52, pulse 94, temperature 97.6 F (36.4 C), temperature source Oral, resp. rate 16, height 5\' 3"  (1.6 m), weight 101.6 kg (224 lb), SpO2 95 %.  PHYSICAL EXAMINATION:  GENERAL:  71 y.o.-year-old patient lying in the bed with no acute  distress.  EYES: Pupils equal, round, reactive to light. No scleral icterus. Extraocular muscles intact.  HEENT: Head atraumatic, normocephalic. Oropharynx and nasopharynx clear.  NECK:  Supple, no jugular venous distention. No thyroid enlargement, no tenderness.  LUNGS: Faint wheezing bilaterally, No use of accessory muscles of respiration.  CARDIOVASCULAR: S1, S2 normal. No murmurs, rubs, or gallops.  ABDOMEN: Soft, nontender, nondistended. Bowel sounds present. No organomegaly or mass.  EXTREMITIES: No pedal edema, cyanosis, or clubbing.  NEUROLOGIC: Cranial nerves II through XII are intact. Muscle strength 5/5 in all extremities. Sensation intact. Gait not checked.  PSYCHIATRIC: The patient is alert and oriented x 3.  SKIN: No obvious rash, lesion, or ulcer.    LABORATORY PANEL:   CBC  Recent Labs Lab 07/24/16 0409  WBC 6.0  HGB 12.0  HCT 35.0  PLT 181   ------------------------------------------------------------------------------------------------------------------  Chemistries   Recent Labs Lab 07/24/16 0409  NA 139  K 4.8  CL 103  CO2 28  GLUCOSE 366*  BUN 22*  CREATININE 1.18*  CALCIUM 8.9   ------------------------------------------------------------------------------------------------------------------  Cardiac Enzymes  Recent Labs Lab 07/23/16 1556  TROPONINI <0.03   ------------------------------------------------------------------------------------------------------------------  RADIOLOGY:  Dg Chest 2 View  Result Date: 07/23/2016 CLINICAL DATA:  Shortness of breath for 2 weeks. Productive cough for 2 days. EXAM: CHEST  2 VIEW COMPARISON:  Chest x-ray dated 07/23/2011. FINDINGS: Heart size and mediastinal contours are within normal limits. Chronic bronchitic changes noted centrally. No confluent opacity to suggest a developing pneumonia. No pleural effusion or pneumothorax seen. Osseous and soft tissue structures about  the chest are unremarkable.  IMPRESSION: 1. No active cardiopulmonary disease. No evidence of pneumonia or pulmonary edema. 2. Chronic bronchitic changes centrally. Electronically Signed   By: Franki Cabot M.D.   On: 07/23/2016 16:50    EKG:   Orders placed or performed during the hospital encounter of 07/23/16  . ED EKG within 10 minutes  . ED EKG within 10 minutes    ASSESSMENT AND PLAN:   #1/acute respiratory failure with hypoxia due to acute bronchitis, COPD exacerbation. , steroids, nebulizers initially required BiPAP in the emergency room. Hypoxia resolving, room air sats are 95% today. We will discharge tomorrow morning if continued to do well. 2. 2 diabetes mellitus type 2 uncontrolled: Add NovoLog 5 units 3 times a day while she is on steroids. Continue Lantus.  3. sinus tachycardia due to respiratory distress: Resolved.  All the records are reviewed and case discussed with Care Management/Social Workerr. Management plans discussed with the patient, family and they are in agreement.  CODE STATUS: full  TOTAL TIME TAKING CARE OF THIS PATIENT: 35 minutes.   POSSIBLE D/C IN 1-2DAYS, DEPENDING ON CLINICAL CONDITION.   Epifanio Lesches M.D on 07/24/2016 at 9:24 AM  Between 7am to 6pm - Pager - [phone]  After 6pm go to [email or web address] - password [email or web address] [facility]  Office  [phone]  CC: Primary care physician; Rusty Aus, MD   Note: This dictation was prepared with Dragon dictation along with smaller phrase technology. Any transcriptional errors that result from this process are unintentional.

## 2016-07-24 NOTE — Progress Notes (Signed)
Inpatient Diabetes Program Recommendations  AACE/ADA: New Consensus Statement on Inpatient Glycemic Control (2015)  Target Ranges:  Prepandial:   less than 140 mg/dL      Peak postprandial:   less than 180 mg/dL (1-2 hours)      Critically ill patients:  140 - 180 mg/dL   Lab Results  Component Value Date   GLUCAP 362 (H) 07/24/2016    Review of Glycemic Control  Results for DEANNAH, ROSSI (MRN [id number]) as of 07/24/2016 08:35  Ref. Range 07/23/2016 17:53 07/23/2016 22:30 07/24/2016 07:49  Glucose-Capillary Latest Ref Range: 65 - 99 mg/dL 98 169 (H) 362 (H)    Diabetes history: Type 2, A1C pending  Outpatient Diabetes medications: Lantus 30 units qam, Novolog 10 units qam, 10 units q lunch, 16 units q supper  Current orders for Inpatient glycemic control: Lantus 30 units qam, Novolog 0-15 units tid, Novolog 0-5 units qhs    * prednisone 60mg  q12h  Inpatient Diabetes Program Recommendations:  Consider adding Novolog 5 units tid (hold if patient eats less than 50%)  Continue Novolog correction as ordered.   Gentry Fitz, RN, BA, MHA, CDE Diabetes Coordinator Inpatient Diabetes Program  [phone] (Team Pager) [phone] ([place]) 07/24/2016 8:40 AM

## 2016-07-25 DIAGNOSIS — N179 Acute kidney failure, unspecified: Secondary | ICD-10-CM | POA: Diagnosis present

## 2016-07-25 DIAGNOSIS — I119 Hypertensive heart disease without heart failure: Secondary | ICD-10-CM | POA: Diagnosis present

## 2016-07-25 DIAGNOSIS — I2510 Atherosclerotic heart disease of native coronary artery without angina pectoris: Secondary | ICD-10-CM

## 2016-07-25 DIAGNOSIS — R0603 Acute respiratory distress: Secondary | ICD-10-CM

## 2016-07-25 DIAGNOSIS — E785 Hyperlipidemia, unspecified: Secondary | ICD-10-CM

## 2016-07-25 DIAGNOSIS — I5043 Acute on chronic combined systolic (congestive) and diastolic (congestive) heart failure: Secondary | ICD-10-CM | POA: Diagnosis present

## 2016-07-25 DIAGNOSIS — I5033 Acute on chronic diastolic (congestive) heart failure: Secondary | ICD-10-CM

## 2016-07-25 DIAGNOSIS — G4733 Obstructive sleep apnea (adult) (pediatric): Secondary | ICD-10-CM

## 2016-07-25 DIAGNOSIS — R5381 Other malaise: Secondary | ICD-10-CM

## 2016-07-25 LAB — BRAIN NATRIURETIC PEPTIDE: B NATRIURETIC PEPTIDE 5: 68 pg/mL (ref 0.0–100.0)

## 2016-07-25 LAB — GLUCOSE, CAPILLARY
GLUCOSE-CAPILLARY: 225 mg/dL — AB (ref 65–99)
GLUCOSE-CAPILLARY: 217 mg/dL — AB (ref 65–99)
Glucose-Capillary: 264 mg/dL — ABNORMAL HIGH (ref 65–99)
Glucose-Capillary: 318 mg/dL — ABNORMAL HIGH (ref 65–99)

## 2016-07-25 LAB — HEMOGLOBIN A1C
Hgb A1c MFr Bld: 6.3 % — ABNORMAL HIGH (ref 4.8–5.6)
Mean Plasma Glucose: 134 mg/dL

## 2016-07-25 MED ORDER — INSULIN GLARGINE 100 UNIT/ML ~~LOC~~ SOLN
35.0000 [IU] | Freq: Every day | SUBCUTANEOUS | Status: DC
  Administered 2016-07-26 – 2016-07-28 (×3): 35 [IU] via SUBCUTANEOUS
  Filled 2016-07-25 (×3): qty 0.35

## 2016-07-25 MED ORDER — FUROSEMIDE 10 MG/ML IJ SOLN
40.0000 mg | Freq: Two times a day (BID) | INTRAMUSCULAR | Status: DC
  Administered 2016-07-25 – 2016-07-26 (×4): 40 mg via INTRAVENOUS
  Filled 2016-07-25 (×5): qty 4

## 2016-07-25 MED ORDER — INSULIN ASPART 100 UNIT/ML ~~LOC~~ SOLN
8.0000 [IU] | Freq: Three times a day (TID) | SUBCUTANEOUS | Status: DC
  Administered 2016-07-25 – 2016-07-28 (×5): 8 [IU] via SUBCUTANEOUS
  Filled 2016-07-25 (×5): qty 1

## 2016-07-25 NOTE — Progress Notes (Signed)
Rounding unit, [place] visit and met with Pt. Pt talked bout things that are very important to her. Pt talked about her health struggles,family, faith, church, and need for prayers for her and for everyone who is struggling. While Pt was still talking, her Dc arrived. [place] excused exited the Rm but promised pt that he would visit with her again soon.    07/25/16 1600  Clinical Encounter Type  Visited With Patient  Visit Type Initial;Spiritual support;Other (Comment)  Referral From [place] Needs Prayer;Emotional;Other (Comment)

## 2016-07-25 NOTE — Progress Notes (Signed)
Inpatient Diabetes Program Recommendations  AACE/ADA: New Consensus Statement on Inpatient Glycemic Control (2015)  Target Ranges:  Prepandial:   less than 140 mg/dL      Peak postprandial:   less than 180 mg/dL (1-2 hours)      Critically ill patients:  140 - 180 mg/dL   Results for MERISSA, RENWICK (MRN [id number]) as of 07/25/2016 10:59  Ref. Range 07/24/2016 07:49 07/24/2016 12:02 07/24/2016 16:22 07/24/2016 21:22 07/25/2016 08:01  Glucose-Capillary Latest Ref Range: 65 - 99 mg/dL 362 (H) 289 (H) 271 (H) 306 (H) 264 (H)   Review of Glycemic Control  Diabetes history: DM2 Outpatient Diabetes medications: Lantus 30 units QAM, Novolog 10 units with breakfast, 10 units with lunch, and 16 units with supper Current orders for Inpatient glycemic control: Lantus 30 units daily, Novolog 0-15 units TID with meals, Novolog 0-5 units QHS, Novolog 5 units TID with meals  Inpatient Diabetes Program Recommendations:  Insulin - Basal: Fasting glucose 264 mg/dl today and Solumedrol 60 mg Q12H ordered. If steroids are continued, please consider increasing Lantus to 35 units daily (to start 07/26/16) and also ordering one time dose of Lantus 5 units x 1 now (since patient has already gotten Lantus 30 units this morning). Insulin - Meal Coverage: If steroids are continued, please consider increasing meal coverage to Novolog 8 units TID with meals if patient eats at least 50% of meals.  Thanks, Barnie Alderman, RN, MSN, CDE Diabetes Coordinator Inpatient Diabetes Program [phone] (Team Pager from 8am to 5pm)

## 2016-07-25 NOTE — Care Management Important Message (Signed)
Important Message  Patient Details  Name: [name] MRN: [id number] Date of Birth: December 13, 1945   Medicare Important Message Given:  Yes    Beverly Sessions, RN 07/25/2016, 3:54 PM

## 2016-07-25 NOTE — Plan of Care (Signed)
Problem: Safety: Goal: Ability to remain free from injury will improve Outcome: Progressing Pt is refuses bed alarm, according to fall protocol pt scores for bed alarm to be on. Pt was educated on the importance of bed alarm for her safety. Pt verbalizes the importance of calling for assistance when she needs to get out of the bed.

## 2016-07-25 NOTE — Consult Note (Signed)
Cardiology Consultation Note  Patient ID: [name], MRN: [id number], DOB/AGE: 11/03/1945 71 y.o. Admit date: 07/23/2016   Date of Consult: 07/25/2016 Primary Physician: Rusty Aus, MD Primary Cardiologist: Dr. Rockey Situ, MD Requesting Physician: Dr. Vianne Bulls, MD  Chief Complaint: SOB/LE swelling Reason for Consult: Acute on chronic diastolic CHF  HPI: [name] is a 71 y.o. female who is being seen today for the evaluation of acute on chronic diastolic CHF at the request of Dr. Vianne Bulls, MD. 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   She previously was admitted to the [facility] in 11/2010, ruled out, had CT chest that was negative for PE. Stress test was negative for ischemia. She was medically managed. Most recent ischemic evaluation by [facility] done in 07/2011 that showed left dominant system with moderate mid LAD, D1,a nd proximal RCA disease all estimated at 50% stenosis. Medical management was advised. She was msot recently seen by Dr. Rockey Situ in 02/2016 for follow up of her CAD. At that time she was doing ok from a cardiac stand point, though did note continued back pain.   There is some confusion when looking at her prior medication list from office visit in 02/2016 as it is documented she takes Lasix 20 mg daily along with torsemide 20 mg every 3rd day. Patient reports she does not take Lasix, and was only taking torsemide 20 mg every 3 rd day with good results.   Recent admission in mid May for nausea, vomiting, and diarrhea. Work up unrevealing. Noted to have AKI. Lasix and  torsmide held at time of discharge. As above, patient was not taking Lasix, but was taking torsemide 20 mg every 3 rd day. Shortly after this discharge she began to note increased LE swelling and SOB. She was unable  to get her shoes on 2/2 LE swelling. This was followed by a trip to the beach the week prior in which she ate out at restaurants daily with her husband.    She was admitted 6/11 with a 3 day hsitory of wheezing, SOB, and cough with sinus tachycardia and oxygen saturations in the 70s at PCP office. 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 She has been coughing up thick, green sputum. As an outpatient, she had recently stopped taking her Lasix as she was felt to be dehydrated. She was improving with treatement of her COPD exacerbation on 6/12 with possible discharge noted on 6/13. Unfortunately, she noted increased SOB with LE swelling on 6/13. Cardiology was asked to further evaluate.   Patient's admission weight noted to be 224 pounds (last clinic weight of 214 pounds in 02/2016). She reports a dry weight of 205-210 pounds. BP has been elevated in the 846K systolic at times. Most recent labs for this admission from 6/11 show a worsening of her renal function from 1.05-->1.18 with a climbing BUN from 19-->22. Potassium at goal 4.8, wbc 6.0, hgb 12.0, plt 181, troponin negative x 1. Because of her SOB and LE swelling IM asked cardiology to evaluate.  IM plans to order echo, CXR, and BNP. She states since starting IV Lasix 20 mg bid today she has voided a significant amount, reporting having filled two hats.   Past Medical History:  Diagnosis Date  . Anxiety   . Arthritis   . Chronic back pain    stenosis.degenerative disc,some scoliosis  . Constipation    takes Stool Softener daily  . Coronary artery disease   . Depression    takes Cymbalta daily  . Diabetes mellitus    Type 2 diabetic. Average  fasting blood sugar runs high 170-200  . Diverticulosis   . E coli infection   . GERD (gastroesophageal reflux disease)    takes Nexium daily  . Headache   . Headache 02/16/2016  . Hemorrhoids   . History of colon polyps    benign  . History of gout    doesn't take any meds  . History of hiatal hernia   . History of vertigo    doesn't take any meds  . Hyperlipidemia    takes Praluent daily  . Hypertension    currently BP medications are on hold   . Hypothyroidism    takes Synthroid daily  . Insomnia    takes Restoril nightly  . Joint pain   . Joint swelling   . Muscle spasm    takes Robaxin as needed  . OSA on CPAP   . Periodic heart flutter   . Peripheral edema    takes LAsix as needed  . Peripheral vascular disease ([place])    AAA as stated per pt / was just discovered and pt states has not been referred to vascular MD   . Restless leg    takes Requip daily  . Rosacea   . Sleep apnea   . Urinary incontinence   . Urinary urgency   . Varicose veins   . Weakness    numbness and tingling.      Most Recent Cardiac Studies: As above   Surgical History:  Past Surgical History:  Procedure Laterality Date  . ABDOMINAL HYSTERECTOMY     with BSo  . CARDIAC CATHETERIZATION  2013   Normal  . CARPAL TUNNEL RELEASE Bilateral   . CATARACT EXTRACTION, BILATERAL    . CHOLECYSTECTOMY    . COLONOSCOPY    . DIAGNOSTIC LAPAROSCOPY     multiple times  . DILATION AND CURETTAGE OF UTERUS    . ESOPHAGOGASTRODUODENOSCOPY (EGD) WITH PROPOFOL N/A 11/01/2014   Procedure: ESOPHAGOGASTRODUODENOSCOPY (EGD) WITH PROPOFOL;  Surgeon: Hulen Luster, MD;  Location[facility] ENDOSCOPY;  Service: Gastroenterology;  Laterality: N/A;  . IMPLANTABLE CONTACT LENS IMPLANTATION     bilateral  . LAMINECTOMY  11/13/2015  . LUMBAR WOUND DEBRIDEMENT N/A 12/02/2015   Procedure: WOUND Exploration;  Surgeon: Consuella Lose, MD;  Location: [facility];  Service: Neurosurgery;  Laterality: N/A;  . PICC LINE PLACE  PERIPHERAL ([place] HX)     right upper arm   . ROTATOR CUFF REPAIR Left   . SAVORY DILATION N/A 11/01/2014   Procedure: SAVORY DILATION;  Surgeon: Hulen Luster, MD;  Location: [facility] ENDOSCOPY;  Service: Gastroenterology;  Laterality: N/A;  . TONSILLECTOMY    . TRIGGER FINGER RELEASE Bilateral      Home Meds: Prior to Admission medications   Medication Sig Start Date End Date Taking? Authorizing Provider  acidophilus (RISAQUAD) CAPS capsule Take 1 capsule by mouth daily. 06/25/16  Yes Theodoro Grist, MD  Alirocumab (PRALUENT) 150 MG/ML SOPN Inject 1 pen into  the skin every 14 (fourteen) days. 01/12/16  Yes Minna Merritts, MD  aspirin 81 MG tablet Take 81 mg by mouth daily.     Yes [provider]  cholecalciferol (VITAMIN D) 1000 UNITS tablet Take 1,000 Units by mouth daily.   Yes [provider]  desonide (DESOWEN) 0.05 % cream Apply 1 application topically 2 (two) times daily.   Yes [provider]  esomeprazole (NEXIUM) 40 MG capsule Take 40 mg by mouth 3 (three) times daily.    Yes [provider]  gabapentin (NEURONTIN) 300 MG capsule Take 300 mg by mouth 4 (four) times daily.    Yes [provider]  hydrOXYzine (ATARAX/VISTARIL) 50 MG tablet Take 50 mg by mouth 2 (two) times daily.  05/26/12  Yes [provider]  hyoscyamine (LEVSIN SL) 0.125 MG SL tablet Place 2 tablets (0.25 mg total) under the tongue every 4 (four) hours as needed for cramping. 06/25/16  Yes Theodoro Grist, MD  insulin aspart (NOVOLOG) 100 UNIT/ML FlexPen Inject 10 Units into the skin See admin instructions. 10 units before breakfast, 10 units before lunch, 16 units at dinner, >200 increase by 2 units per sliding scale 03/01/15 07/23/17 Yes [provider]  insulin glargine (LANTUS) 100 UNIT/ML injection Inject 30 Units into the skin daily. Take every morning    Yes [provider]  levothyroxine (SYNTHROID, LEVOTHROID) 75 MCG tablet Take 75 mcg by mouth  daily before breakfast.  11/24/12  Yes [provider]  lisinopril (PRINIVIL,ZESTRIL) 10 MG tablet Take 10 mg by mouth daily.   Yes [provider]  metoprolol succinate (TOPROL XL) 25 MG 24 hr tablet Take 1 tablet (25 mg total) by mouth daily. 03/06/16  Yes Gollan, Kathlene November, MD  nitroGLYCERIN (NITROSTAT) 0.4 MG SL tablet Place 1 tablet (0.4 mg total) under the tongue every 5 (five) minutes as needed for chest pain. 03/06/16 09/18/17 Yes Gollan, Kathlene November, MD  ondansetron (ZOFRAN) 4 MG tablet Take 1 tablet (4 mg total) by mouth every 6 (six) hours as needed for nausea. 06/25/16  Yes Theodoro Grist, MD  ropinirole (REQUIP) 5 MG tablet Take 5 mg by mouth 2 (two) times daily.   Yes [provider]  temazepam (RESTORIL) 30 MG capsule Take 30 mg by mouth at bedtime.   Yes [provider]  traMADol (ULTRAM) 50 MG tablet Take 50 mg by mouth 3 (three) times daily as needed for moderate pain.   Yes [provider]  isosorbide mononitrate (IMDUR) 30 MG 24 hr tablet Take 30 mg by mouth as needed. 07/25/11 09/08/15  Minna Merritts, MD    Inpatient Medications:  . acidophilus  1 capsule Oral Daily  . aspirin EC  81 mg Oral Daily  . enoxaparin (LOVENOX) injection  40 mg Subcutaneous Q24H  . furosemide  40 mg Intravenous Q12H  . gabapentin  300 mg Oral QID  . hydrOXYzine  50 mg Oral BID  . insulin aspart  0-15 Units Subcutaneous TID WC  . insulin aspart  0-5 Units Subcutaneous QHS  . insulin aspart  8 Units Subcutaneous TID WC  . [START ON 07/26/2016] insulin glargine  35 Units Subcutaneous Daily  . ipratropium-albuterol  3 mL Nebulization Q4H  . levothyroxine  75 mcg Oral QAC breakfast  . methylPREDNISolone (SOLU-MEDROL) injection  60 mg Intravenous Q12H  . metoprolol tartrate  25 mg Oral BID  . pantoprazole  40 mg Oral Daily  . ropinirole  5 mg Oral BID  .  temazepam  30 mg Oral QHS     Allergies:  Allergies  Allergen Reactions  . Ceftin [Cefuroxime  Axetil] Diarrhea    diarrhea  . Codeine Rash       . Penicillins Rash    Has patient had a PCN reaction causing immediate rash, facial/tongue/throat swelling, SOB or lightheadedness with hypotension: YES Has patient had a PCN reaction causing severe rash involving mucus membranes or skin necrosis: NO Has patient had a PCN retioion that required hospitalization NO Has patient had a PCN reaction occurring within the last 10 years: YES If all of the above answers are "NO", then may proceed with Cephalosporin use.  . Vicodin [Hydrocodone-Acetaminophen] Rash     Also passes out    Social History   Social History  . Marital status: Married    Spouse name: N/A  . Number of children: N/A  . Years of education: N/A   Occupational History  . retired    Social History Main Topics  . Smoking status: Never Smoker  . Smokeless tobacco: Never Used  . Alcohol use No  . Drug use: No  . Sexual activity: Not on file   Other Topics Concern  . Not on file   Social History Narrative  . No narrative on file     Family History  Problem Relation Age of Onset  . Heart disease Mother   . Breast cancer Mother 53  . Heart disease Father   . Heart attack Sister   . Heart disease Sister   . Heart disease Brother      Review of Systems: Review of Systems  Constitutional: Positive for malaise/fatigue. Negative for chills, diaphoresis, fever and weight loss.  HENT: Negative for congestion.   Eyes: Negative for discharge and redness.  Respiratory: Positive for cough, sputum production, shortness of breath and wheezing. Negative for hemoptysis.        Green to yellow sputum  Cardiovascular: Positive for leg swelling. Negative for chest pain, palpitations, orthopnea, claudication and PND.  Gastrointestinal: Negative for abdominal pain, blood in stool, heartburn, melena, nausea and vomiting.  Genitourinary: Negative for hematuria.  Musculoskeletal: Positive for joint pain. Negative for falls  and myalgias.       Right knee  Skin: Negative for rash.  Neurological: Positive for weakness. Negative for dizziness, tingling, tremors, sensory change, speech change, focal weakness and loss of consciousness.  Endo/Heme/Allergies: Does not bruise/bleed easily.  Psychiatric/Behavioral: Negative for substance abuse. The patient is not nervous/anxious.   All other systems reviewed and are negative.   Labs:  Recent Labs  07/23/16 1556  TROPONINI <0.03   Lab Results  Component Value Date   WBC 6.0 07/24/2016   HGB 12.0 07/24/2016   HCT 35.0 07/24/2016   MCV 85.3 07/24/2016   PLT 181 07/24/2016     Recent Labs Lab 07/24/16 0409  NA 139  K 4.8  CL 103  CO2 28  BUN 22*  CREATININE 1.18*  CALCIUM 8.9  GLUCOSE 366*   Lab Results  Component Value Date   CHOL 163 07/26/2015   HDL 32 (L) 07/26/2015   LDLCALC 94 07/26/2015   TRIG 187 (H) 07/26/2015   No results found for: DDIMER  Radiology/Studies:  Dg Chest 2 View  Result Date: 07/23/2016 CLINICAL DATA:  Shortness of breath for 2 weeks. Productive cough for 2 days. EXAM: CHEST  2 VIEW COMPARISON:  Chest x-ray dated 07/23/2011. FINDINGS: Heart size and mediastinal contours are within normal limits. Chronic bronchitic changes  noted centrally. No confluent opacity to suggest a developing pneumonia. No pleural effusion or pneumothorax seen. Osseous and soft tissue structures about the chest are unremarkable. IMPRESSION: 1. No active cardiopulmonary disease. No evidence of pneumonia or pulmonary edema. 2. Chronic bronchitic changes centrally. Electronically Signed   By: Franki Cabot M.D.   On: 07/23/2016 16:50    EKG: Interpreted by me showed: sinus tachycardia, 121 bpm, no acute st/t changes Telemetry: Interpreted by me showed: NSR, 70s bpm  Weights: Filed Weights   07/23/16 1558 07/25/16 0553  Weight: 224 lb (101.6 kg) 225 lb 8 oz (102.3 kg)     Physical Exam: Blood pressure (!) 152/68, pulse 81, temperature 97.8 F  (36.6 C), temperature source Oral, resp. rate 20, height 5\' 3"  (1.6 m), weight 225 lb 8 oz (102.3 kg), SpO2 98 %. Body mass index is 39.95 kg/m. General: Well developed, well nourished, in no acute distress. Head: Normocephalic, atraumatic, sclera non-icteric, no xanthomas, nares are without discharge.  Neck: Negative for carotid bruits. JVD difficult to assess 2/2 body habitus. Lungs: Diminished breath sounds bilaterally with expiratory wheezing. Breathing is unlabored. On room air. Heart: RRR with S1 S2. No murmurs, rubs, or gallops appreciated. Abdomen: Obese, soft, non-tender, non-distended with normoactive bowel sounds. No hepatomegaly. No rebound/guarding. No obvious abdominal masses. Msk:  Strength and tone appear normal for age. Extremities: No clubbing or cyanosis. Trace pre-tibial edema RLE with 1+ pitting edema of LLE. Distal pedal pulses are 2+ and equal bilaterally. Neuro: Alert and oriented X 3. No facial asymmetry. No focal deficit. Moves all extremities spontaneously. Psych:  Responds to questions appropriately with a normal affect.    Assessment and Plan:  Principal Problem:   Acute respiratory distress Active Problems:   Acute bronchitis   Acute on chronic combined systolic and diastolic CHF (congestive heart failure) (HCC)   Hypertensive heart disease   AKI (acute kidney injury) ([place])   Morbid obesity (HCC)   Physical deconditioning    1. Acute respiratory distress: -Likely multifactorial including acute bronchitis with treatment with IV steroids with acute on chronic diastolic CHF in the setting of hypertensive heart disease -Initially required BiPAP, now weaned to room air -As below  2. Acute on chronic diastolic CHF: -Likely exacerbated by stopping torsemide in the setting AKI in mid May, along with dietary indiscretions while at the beach, as well as acute bronchitis therapy with steroids -She reports good UOP with IV Lasix 40 mg bid started today (has  received 1 dose). Documented UOP of 1.4 L.  -Continue IV Lasix 20 mg bid with KCl repletion through today     -Check TTE -Check BNP -IM ordering CXR -Possibly change to PO torsemide on 6/14 -Will likely need some diuretic at time of discharge, though will need to monitor her renal function closely as it appears she drys up pretty quickly   3. Hypertensive heart disease: -Increase Lopressor to 50 mg bid -Lasix as above -May need to add further antihypertensive, though has previously ran on the low side with added medications  4. AKI: -Monitor with diuresis   5. Acute bronchitis: -Per IM -Consider weaning steroids if able given the above  6. Morbid obesity/deconditoning: -Would benefit from pulmonary rehab   Signed, Marcille Blanco [place] Pager: [phone] 07/25/2016, 3:29 PM

## 2016-07-25 NOTE — Progress Notes (Signed)
[facility] at [place] NAME: [name]    MR#:  [id number]  DATE OF BIRTH:  [birth year]  SUBJECTIVE: Admitted for COPD exacerbation. He has shortness of breath, lower extremity edema. Patient stopped her Lasix recently because of dehydration but she thinks it she needs to be on Lasix again and complains of swelling in the legs and she is very anxious..   CHIEF COMPLAINT:   Chief Complaint  Patient presents with  . Shortness of Breath    REVIEW OF SYSTEMS:   ROS CONSTITUTIONAL: No fever, fatigue or weakness.  EYES: No blurred or double vision.  EARS, NOSE, AND THROAT: No tinnitus or ear pain.  RESPIRATORY: Cough, shortness of breath. CARDIOVASCULAR: No chest pain,Does have orthopnea, PND.  GASTROINTESTINAL: No nausea, vomiting, diarrhea or abdominal pain.  GENITOURINARY: No dysuria, hematuria.  ENDOCRINE: No polyuria, nocturia,  HEMATOLOGY: No anemia, easy bruising or bleeding SKIN: No rash or lesion. MUSCULOSKELETAL: No joint pain or arthritis.   NEUROLOGIC: No tingling, numbness, weakness.  PSYCHIATRY: No anxiety or depression.   DRUG ALLERGIES:   Allergies  Allergen Reactions  . Ceftin [Cefuroxime Axetil] Diarrhea    diarrhea  . Codeine Rash       . Penicillins Rash    Has patient had a PCN reaction causing immediate rash, facial/tongue/throat swelling, SOB or lightheadedness with hypotension: YES Has patient had a PCN reaction causing severe rash involving mucus membranes or skin necrosis: NO Has patient had a PCN retioion that required hospitalization NO Has patient had a PCN reaction occurring within the last 10 years: YES If all of the above answers are "NO", then may proceed with Cephalosporin use.  . Vicodin [Hydrocodone-Acetaminophen] Rash     Also passes out    VITALS:  Blood pressure (!) 152/68, pulse 81, temperature 97.8 F (36.6 C), temperature source Oral, resp. rate 20, height 5\' 3"  (1.6 m), weight  102.3 kg (225 lb 8 oz), SpO2 98 %.  PHYSICAL EXAMINATION:  GENERAL:  71 y.o.-year-old patient lying in the bed with no acute distress.  EYES: Pupils equal, round, reactive to light. No scleral icterus. Extraocular muscles intact.  HEENT: Head atraumatic, normocephalic. Oropharynx and nasopharynx clear.  NECK:  Supple, no jugular venous distention. No thyroid enlargement, no tenderness.  LUNGS: Coarse breath sounds bilaterally. CARDIOVASCULAR: S1, S2 normal. No murmurs, rubs, or gallops.  ABDOMEN: Soft, nontender, nondistended. Bowel sounds present. No organomegaly or mass.  EXTREMITIES: one plus  pitting edema bilaterally. NEUROLOGIC: Cranial nerves II through XII are intact. Muscle strength 5/5 in all extremities. Sensation intact. Gait not checked.  PSYCHIATRIC: The patient is alert and oriented x 3.  SKIN: No obvious rash, lesion, or ulcer.    LABORATORY PANEL:   CBC  Recent Labs Lab 07/24/16 0409  WBC 6.0  HGB 12.0  HCT 35.0  PLT 181   ------------------------------------------------------------------------------------------------------------------  Chemistries   Recent Labs Lab 07/24/16 0409  NA 139  K 4.8  CL 103  CO2 28  GLUCOSE 366*  BUN 22*  CREATININE 1.18*  CALCIUM 8.9   ------------------------------------------------------------------------------------------------------------------  Cardiac Enzymes  Recent Labs Lab 07/23/16 1556  TROPONINI <0.03   ------------------------------------------------------------------------------------------------------------------  RADIOLOGY:  Dg Chest 2 View  Result Date: 07/23/2016 CLINICAL DATA:  Shortness of breath for 2 weeks. Productive cough for 2 days. EXAM: CHEST  2 VIEW COMPARISON:  Chest x-ray dated 07/23/2011. FINDINGS: Heart size and mediastinal contours are within normal limits. Chronic bronchitic changes noted centrally. No confluent opacity  to suggest a developing pneumonia. No pleural effusion or  pneumothorax seen. Osseous and soft tissue structures about the chest are unremarkable. IMPRESSION: 1. No active cardiopulmonary disease. No evidence of pneumonia or pulmonary edema. 2. Chronic bronchitic changes centrally. Electronically Signed   By: Franki Cabot M.D.   On: 07/23/2016 16:50    EKG:   Orders placed or performed during the hospital encounter of 07/23/16  . ED EKG within 10 minutes  . ED EKG within 10 minutes    ASSESSMENT AND PLAN:   #1/acute respiratory failure with hypoxia due to acute bronchitis, COPD exacerbation. , steroids, nebulizers initially required BiPAP in the emergency room. Hypoxia resolving, room air sats are 95% today.  2. Acute on chronic diastolic heart failure: Check BNP, chest x-ray again, start IV Lasix, cardiology consult. Echocardiogram to be repeated. Last echo in the records is only from 2009. Saw DR.r,Gollan  January of this year.   2 diabetes mellitus type 2 uncontrolled: AdddedNovoLog 8 units 3 times a day while she is on steroids. Continue Lantus. Adjust lantus.   3. sinus tachycardia due to respiratory distress: Resolved.  All the records are reviewed and case discussed with Care Management/Social Workerr. Management plans discussed with the patient, family and they are in agreement.  CODE STATUS: full  TOTAL TIME TAKING CARE OF THIS PATIENT: 35 minutes.   POSSIBLE D/C IN 1-2DAYS, DEPENDING ON CLINICAL CONDITION.   Epifanio Lesches M.D on 07/25/2016 at 1:03 PM  Between 7am to 6pm - Pager - [phone]  After 6pm go to [email or web address] - password [email or web address] [facility]  Office  [phone]  CC: Primary care physician; Rusty Aus, MD   Note: This dictation was prepared with Dragon dictation along with smaller phrase technology. Any transcriptional errors that result from this process are unintentional.

## 2016-07-26 ENCOUNTER — Inpatient Hospital Stay (HOSPITAL_COMMUNITY)
Admit: 2016-07-26 | Discharge: 2016-07-26 | Disposition: A | Discharge status: Home / Self Care | Payer: Medicare Other | Attending: Internal Medicine | Admitting: Internal Medicine

## 2016-07-26 DIAGNOSIS — R0902 Hypoxemia: Secondary | ICD-10-CM

## 2016-07-26 DIAGNOSIS — N179 Acute kidney failure, unspecified: Secondary | ICD-10-CM

## 2016-07-26 DIAGNOSIS — I5030 Unspecified diastolic (congestive) heart failure: Secondary | ICD-10-CM

## 2016-07-26 DIAGNOSIS — R55 Syncope and collapse: Secondary | ICD-10-CM

## 2016-07-26 LAB — ECHOCARDIOGRAM COMPLETE
Height: 63 in
Weight: 3563.2 oz

## 2016-07-26 LAB — BASIC METABOLIC PANEL
Anion gap: 12 (ref 5–15)
BUN: 40 mg/dL — AB (ref 6–20)
CHLORIDE: 95 mmol/L — AB (ref 101–111)
CO2: 29 mmol/L (ref 22–32)
CREATININE: 1.25 mg/dL — AB (ref 0.44–1.00)
Calcium: 9.9 mg/dL (ref 8.9–10.3)
GFR calc Af Amer: 49 mL/min — ABNORMAL LOW (ref >60)
GFR calc non Af Amer: 42 mL/min — ABNORMAL LOW (ref >60)
Glucose, Bld: 277 mg/dL — ABNORMAL HIGH (ref 65–99)
Potassium: 3.8 mmol/L (ref 3.5–5.1)
SODIUM: 136 mmol/L (ref 135–145)

## 2016-07-26 LAB — GLUCOSE, CAPILLARY
GLUCOSE-CAPILLARY: 247 mg/dL — AB (ref 65–99)
Glucose-Capillary: 196 mg/dL — ABNORMAL HIGH (ref 65–99)
Glucose-Capillary: 209 mg/dL — ABNORMAL HIGH (ref 65–99)
Glucose-Capillary: 312 mg/dL — ABNORMAL HIGH (ref 65–99)

## 2016-07-26 MED ORDER — METOPROLOL TARTRATE 50 MG PO TABS
50.0000 mg | ORAL_TABLET | Freq: Two times a day (BID) | ORAL | Status: DC
  Administered 2016-07-26 – 2016-07-28 (×4): 50 mg via ORAL
  Filled 2016-07-26 (×4): qty 1

## 2016-07-26 MED ORDER — METOPROLOL TARTRATE 25 MG PO TABS
25.0000 mg | ORAL_TABLET | Freq: Once | ORAL | Status: AC
  Administered 2016-07-26: 25 mg via ORAL
  Filled 2016-07-26: qty 1

## 2016-07-26 MED ORDER — METHYLPREDNISOLONE SODIUM SUCC 40 MG IJ SOLR
40.0000 mg | Freq: Two times a day (BID) | INTRAMUSCULAR | Status: DC
  Administered 2016-07-26 – 2016-07-27 (×3): 40 mg via INTRAVENOUS
  Filled 2016-07-26 (×4): qty 1

## 2016-07-26 NOTE — Progress Notes (Signed)
*  PRELIMINARY RESULTS* Echocardiogram 2D Echocardiogram has been performed.  [name] 07/26/2016, 11:36 AM

## 2016-07-26 NOTE — Progress Notes (Signed)
Patient Name: RAENETTE SAKATA Date of Encounter: 07/26/2016  Primary Cardiologist: [facility] Problem List     Principal Problem:   Acute respiratory distress Active Problems:   Acute bronchitis   Acute on chronic combined systolic and diastolic CHF (congestive heart failure) (HCC)   Hypertensive heart disease   AKI (acute kidney injury) ([place])   Morbid obesity ([place])   Physical deconditioning     Subjective   Breathing much improved. Documented urine output of 4.8 L for admission. Be met pending. Weight down to 222 pounds from 225 pounds on 6/13. Wheezing improved. Remains on IV Lasix 40 mg twice a day along with IV steroids and nebulizer therapy. Lower 70 swelling improved.  Inpatient Medications    Scheduled Meds: . acidophilus  1 capsule Oral Daily  . aspirin EC  81 mg Oral Daily  . enoxaparin (LOVENOX) injection  40 mg Subcutaneous Q24H  . furosemide  40 mg Intravenous Q12H  . gabapentin  300 mg Oral QID  . hydrOXYzine  50 mg Oral BID  . insulin aspart  0-15 Units Subcutaneous TID WC  . insulin aspart  0-5 Units Subcutaneous QHS  . insulin aspart  8 Units Subcutaneous TID WC  . insulin glargine  35 Units Subcutaneous Daily  . ipratropium-albuterol  3 mL Nebulization Q4H  . levothyroxine  75 mcg Oral QAC breakfast  . methylPREDNISolone (SOLU-MEDROL) injection  60 mg Intravenous Q12H  . metoprolol tartrate  25 mg Oral BID  . pantoprazole  40 mg Oral Daily  . ropinirole  5 mg Oral BID  . temazepam  30 mg Oral QHS   Continuous Infusions:  PRN Meds: acetaminophen **OR** acetaminophen, albuterol, benzonatate, guaiFENesin-dextromethorphan, hyoscyamine, ondansetron **OR** ondansetron (ZOFRAN) IV, polyethylene glycol, traMADol   Vital Signs    Vitals:   07/25/16 2047 07/25/16 2117 07/26/16 0434 07/26/16 0936  BP:  (!) 128/50 126/68 (!) 161/66  Pulse:  83 75 76  Resp:  16 18   Temp:  97.8 F (36.6 C) 97.8 F (36.6 C)   TempSrc:  Oral Oral   SpO2: 97% 96%  94%   Weight:   222 lb 11.2 oz (101 kg)   Height:        Intake/Output Summary (Last 24 hours) at 07/26/16 1023 Last data filed at 07/26/16 0925  Gross per 24 hour  Intake              840 ml  Output             4900 ml  Net            -4060 ml   Filed Weights   07/23/16 1558 07/25/16 0553 07/26/16 0434  Weight: 224 lb (101.6 kg) 225 lb 8 oz (102.3 kg) 222 lb 11.2 oz (101 kg)    Physical Exam    GEN: Well nourished, well developed, in no acute distress.  HEENT: Grossly normal.  Neck: Supple, no JVD, carotid bruits, or masses. Cardiac: RRR, no murmurs, rubs, or gallops. No clubbing, cyanosis, improved trace pre-tibial edema.  Radials/DP/PT 2+ and equal bilaterally.  Respiratory:  Improved breath sounds bilaterally with faint wheezing GI: Soft, nontender, nondistended, BS + x 4. MS: no deformity or atrophy. Skin: warm and dry, no rash. Neuro:  Strength and sensation are intact. Psych: AAOx3.  Normal affect.  Labs    CBC  Recent Labs  07/23/16 1556 07/24/16 0409  WBC 8.1 6.0  HGB 12.9 12.0  HCT 38.0 35.0  MCV 83.6 85.3  PLT 210 683   Basic Metabolic Panel  Recent Labs  07/23/16 1556 07/24/16 0409  NA 138 139  K 4.0 4.8  CL 100* 103  CO2 30 28  GLUCOSE 106* 366*  BUN 19 22*  CREATININE 1.05* 1.18*  CALCIUM 9.4 8.9   Liver Function Tests No results for input(s): AST, ALT, ALKPHOS, BILITOT, PROT, ALBUMIN in the last 72 hours. No results for input(s): LIPASE, AMYLASE in the last 72 hours. Cardiac Enzymes  Recent Labs  07/23/16 1556  TROPONINI <0.03   BNP Invalid input(s): POCBNP D-Dimer No results for input(s): DDIMER in the last 72 hours. Hemoglobin A1C  Recent Labs  07/23/16 1556  HGBA1C 6.3*   Fasting Lipid Panel No results for input(s): CHOL, HDL, LDLCALC, TRIG, CHOLHDL, LDLDIRECT in the last 72 hours. Thyroid Function Tests No results for input(s): TSH, T4TOTAL, T3FREE, THYROIDAB in the last 72 hours.  Invalid input(s):  FREET3  Telemetry    NSR, 70s bpm - Personally Reviewed  ECG    n/a - Personally Reviewed  Radiology    No results found.  Cardiac Studies   TTE pending  Patient Profile     71 y.o. female with history of nonobstructive CAD by LHC in 07/2011, hypertensive heart disease with chronic diastolic CHF, DM, obesity, OSA on CPAP, HTN, HLD with statin [place] on Praluent, PVD, vertigo, venous insufficiency, hypothyroidism, chronic back pain s/p surgery in 41/9622 complicated by post-op infection with E coli sepsis requiring drain, PICC line, and IV antibiotics who presented to [facility] on [facility] with 3 day history of worsening LE swelling and SOB.   Assessment & Plan    1. Acute respiratory distress: -Likely multifactorial including acute bronchitis with treatment with IV steroids with acute on chronic diastolic CHF in the setting of hypertensive heart disease -Initially required BiPAP, now weaned to room air -As below  2. Acute on chronic diastolic CHF: -Likely exacerbated by stopping torsemide in the setting AKI in mid May, along with dietary indiscretions while at the beach, as well as acute bronchitis therapy with steroids -Good urine output with IV Lasix 40 twice a day started on 6/13, would continue IV diuresis today and possibly this afternoon pending labs. She does appear to be somewhat mildly volume overloaded this morning though significantly improved. Documented urine output of 4.8 L for the admission. Weight down to 222 pounds from 225 pounds -Continue IV Lasix 40 mg bid with KCl repletion through today     -Check TTE -BNP normal -IM ordering CXR -Possibly change to PO torsemide on 6/14 p.m. -Will likely need some diuretic at time of discharge, though will need to monitor her renal function closely as it appears she drys up pretty quickly   3. Hypertensive heart disease: -Blood pressure remains elevated -Increase Lopressor to 50 mg bid -Lasix as above -May need to add  further antihypertensive, though has previously ran on the low side with added medications  4. AKI: -Monitor with diuresis   5. Acute bronchitis: -Per IM -Consider weaning steroids if able given the above  6. Morbid obesity/deconditoning: -Would benefit from pulmonary rehab  Signed, Marcille Blanco [place] Pager: [phone] 07/26/2016, 10:23 AM   Attending Note Patient seen and examined, agree with detailed note above,  Patient presentation and plan discussed on rounds.   Long discussion with her concerning her recent presentation Bronchitis symptoms with coughing, wheezing Poor diet at the beach, salt and fluid indiscretion Feels better on steroids, gentle  diuresis  On physical exam unable to estimate JVD, still with some expiratory wheezing otherwise clear, heart sounds regular with no murmurs appreciated, abdomen soft nontender, no significant lower extremity edema   Lab work reviewed person by myself showing creatinine 1.25, BUN 40, BNP 68  --- Acute respiratory distress, Acute bronchitis Improving with steroids  --- Acute on Chronic diastolic CHF Poor diet over the past week at the beach, eating out every night She was not taking her diuretic on a regular basis, once every 4 days only despite changing her diet Long discussion concerning management of her diet, watching her weight, following a low-salt and fluid diet   ----Morbid obesity, deconditioning Would strongly recommend outpatient rehabilitation program, regular exercise   much of her shortness of breath is likely secondary to deconditioning  Long discussion with her concerning above  Greater than 50% was spent in counseling and coordination of care with patient Total encounter time 35 minutes or more   Signed: Esmond Plants  M.D., Ph.D. [facility]

## 2016-07-26 NOTE — Progress Notes (Signed)
[place] at [place] NAME: [name]    MR#:  [id number]  DATE OF BIRTH:  1945-07-29  SUBJECTIVE: Admitted for COPD exacerbation. He has shortness of breath, lower extremity edema. Patient stopped her Lasix recently because of dehydration but she thinks it she needs to be on Lasix again and complains of swelling in the legs and she is very anxious..   CHIEF COMPLAINT:   Chief Complaint  Patient presents with  . Shortness of Breath   Shortness of breath is better still wheezing. Lower extremity swelling is improving with Lasix REVIEW OF SYSTEMS:   ROS CONSTITUTIONAL: No fever, fatigue or weakness.  EYES: No blurred or double vision.  EARS, NOSE, AND THROAT: No tinnitus or ear pain.  RESPIRATORY: Cough, shortness of breath.  CARDIOVASCULAR: No chest pain,Does have orthopnea, PND.  GASTROINTESTINAL: No nausea, vomiting, diarrhea or abdominal pain.  GENITOURINARY: No dysuria, hematuria.  ENDOCRINE: No polyuria, nocturia,  HEMATOLOGY: No anemia, easy bruising or bleeding SKIN: No rash or lesion. MUSCULOSKELETAL: No joint pain or arthritis.   NEUROLOGIC: No tingling, numbness, weakness.  PSYCHIATRY: No anxiety or depression.   DRUG ALLERGIES:   Allergies  Allergen Reactions  . Ceftin [Cefuroxime Axetil] Diarrhea    diarrhea  . Codeine Rash       . Penicillins Rash    Has patient had a PCN reaction causing immediate rash, facial/tongue/throat swelling, SOB or lightheadedness with hypotension: YES Has patient had a PCN reaction causing severe rash involving mucus membranes or skin necrosis: NO Has patient had a PCN retioion that required hospitalization NO Has patient had a PCN reaction occurring within the last 10 years: YES If all of the above answers are "NO", then may proceed with Cephalosporin use.  . Vicodin [Hydrocodone-Acetaminophen] Rash     Also passes out    VITALS:  Blood pressure 137/63, pulse 71, temperature  98.2 F (36.8 C), temperature source Oral, resp. rate 18, height 5\' 3"  (1.6 m), weight 101 kg (222 lb 11.2 oz), SpO2 98 %.  PHYSICAL EXAMINATION:  GENERAL:  71 y.o.-year-old patient lying in the bed with no acute distress.  EYES: Pupils equal, round, reactive to light. No scleral icterus. Extraocular muscles intact.  HEENT: Head atraumatic, normocephalic. Oropharynx and nasopharynx clear.  NECK:  Supple, no jugular venous distention. No thyroid enlargement, no tenderness.  LUNGS: Coarse breath sounds with diffuse bilateral wheezing. No rubs CARDIOVASCULAR: S1, S2 normal. No murmurs, rubs ABDOMEN: Soft, nontender, nondistended. Bowel sounds present. No organomegaly or mass.  EXTREMITIES: Trace pitting edema bilaterally. NEUROLOGIC: Cranial nerves II through XII are intact. Muscle strength 5/5 in all extremities. Sensation intact. Gait not checked.  PSYCHIATRIC: The patient is alert and oriented x 3.  SKIN: No obvious rash, lesion, or ulcer.    LABORATORY PANEL:   CBC  Recent Labs Lab 07/24/16 0409  WBC 6.0  HGB 12.0  HCT 35.0  PLT 181   ------------------------------------------------------------------------------------------------------------------  Chemistries   Recent Labs Lab 07/26/16 1026  NA 136  K 3.8  CL 95*  CO2 29  GLUCOSE 277*  BUN 40*  CREATININE 1.25*  CALCIUM 9.9   ------------------------------------------------------------------------------------------------------------------  Cardiac Enzymes  Recent Labs Lab 07/23/16 1556  TROPONINI <0.03   ------------------------------------------------------------------------------------------------------------------  RADIOLOGY:  No results found.  EKG:   Orders placed or performed during the hospital encounter of 07/23/16  . ED EKG within 10 minutes  . ED EKG within 10 minutes    ASSESSMENT AND PLAN:   #  1/acute respiratory failure with hypoxia due to acute bronchitis, Acute CHF exacerbation COPD  exacerbation. , steroids, nebulizers initially required BiPAP in the emergency room. Hypoxia resolving, room air sats are 95% today.   # Acute on chronic diastolic heart failure:  BNP normal chest x-ray initially was negative. Repeat chest x-ray ordered Clinically improving with IV Lasix, change frequency to 40 mg twice a day with potassium repletion as needed. Continue metoprolol. Monitor renal function closely Monitor intake and output and daily weights Cardiology recommended echocardiogram which is ordered Last echo in the records is only from 2009. Saw DR.,Gollan  January of this year.   #diabetes mellitus type 2 uncontrolled: Lantus 35 units subcutaneously once daily AdddedNovoLog 8 units 3 times a day while she is on steroids.   #Hypothyroidism  continue Synthroid  #  sinus tachycardia due to respiratory distress: Resolved.  All the records are reviewed and case discussed with Care Management/Social Workerr. Management plans discussed with the patient, family and they are in agreement.  CODE STATUS: full  TOTAL TIME TAKING CARE OF THIS PATIENT: 35 minutes.   POSSIBLE D/C IN 1-2DAYS, DEPENDING ON CLINICAL CONDITION.   Nicholes Mango M.D on 07/26/2016 at 2:01 PM  Between 7am to 6pm - Pager - [phone]  After 6pm go to [email or web address] - password [email or web address] [place] [facility]  Office  [phone]  CC: Primary care physician; Rusty Aus, MD   Note: This dictation was prepared with Dragon dictation along with smaller phrase technology. Any transcriptional errors that result from this process are unintentional.

## 2016-07-26 NOTE — Care Management (Signed)
Admitted to this facility with the diagnosis of acute respiratory distress. Lives with husband, Sonia Side, [phone]). Son is Corene Cornea [phone]). Last seen Dr, Sabra Heck on Monday. Prescriptions are filled at [facility] or [facility] per [facility]. [facility] in the home. [facility] for [facility] in the past. [place] ( November 2017-February 2018) and [facility] in the past. No home oxygen. Cane, rolling walker, wheelchair, and CPAP in the home. Takes care of all basic activities of daily living herself, drives. Fell last week, Medium appetite. Family or friends will transport. [facility] recommended. Possible discharge tomorrow 07/27/16 Shelbie Ammons RN MSN CCM Care Management [phone]

## 2016-07-27 ENCOUNTER — Inpatient Hospital Stay: Payer: Medicare Other

## 2016-07-27 LAB — MAGNESIUM: MAGNESIUM: 2.1 mg/dL (ref 1.7–2.4)

## 2016-07-27 LAB — TROPONIN I
Troponin I: <0.03 ng/mL (ref <0.03)
Troponin I: <0.03 ng/mL (ref <0.03)
Troponin I: <0.03 ng/mL (ref <0.03)

## 2016-07-27 LAB — CBC
HCT: 39.7 % (ref 35.0–47.0)
HEMOGLOBIN: 13.8 g/dL (ref 12.0–16.0)
MCH: 29 pg (ref 26.0–34.0)
MCHC: 34.7 g/dL (ref 32.0–36.0)
MCV: 83.7 fL (ref 80.0–100.0)
PLATELETS: 259 10*3/uL (ref 150–440)
RBC: 4.75 MIL/uL (ref 3.80–5.20)
RDW: 14.3 % (ref 11.5–14.5)
WBC: 13.2 10*3/uL — AB (ref 3.6–11.0)

## 2016-07-27 LAB — BASIC METABOLIC PANEL
ANION GAP: 9 (ref 5–15)
BUN: 46 mg/dL — AB (ref 6–20)
CHLORIDE: 96 mmol/L — AB (ref 101–111)
CO2: 33 mmol/L — ABNORMAL HIGH (ref 22–32)
Calcium: 9.5 mg/dL (ref 8.9–10.3)
Creatinine, Ser: 1.27 mg/dL — ABNORMAL HIGH (ref 0.44–1.00)
GFR, EST AFRICAN AMERICAN: 48 mL/min — AB (ref >60)
GFR, EST NON AFRICAN AMERICAN: 41 mL/min — AB (ref >60)
Glucose, Bld: 204 mg/dL — ABNORMAL HIGH (ref 65–99)
POTASSIUM: 3.7 mmol/L (ref 3.5–5.1)
SODIUM: 138 mmol/L (ref 135–145)

## 2016-07-27 LAB — GLUCOSE, CAPILLARY
GLUCOSE-CAPILLARY: 194 mg/dL — AB (ref 65–99)
GLUCOSE-CAPILLARY: 182 mg/dL — AB (ref 65–99)
Glucose-Capillary: 220 mg/dL — ABNORMAL HIGH (ref 65–99)
Glucose-Capillary: 203 mg/dL — ABNORMAL HIGH (ref 65–99)

## 2016-07-27 MED ORDER — NITROGLYCERIN 0.4 MG SL SUBL
0.4000 mg | SUBLINGUAL_TABLET | SUBLINGUAL | Status: DC | PRN
  Administered 2016-07-27: 0.4 mg via SUBLINGUAL
  Filled 2016-07-27: qty 1

## 2016-07-27 MED ORDER — POTASSIUM CHLORIDE CRYS ER 20 MEQ PO TBCR
20.0000 meq | EXTENDED_RELEASE_TABLET | Freq: Every day | ORAL | Status: DC
  Administered 2016-07-27 – 2016-07-28 (×2): 20 meq via ORAL
  Filled 2016-07-27 (×2): qty 1

## 2016-07-27 MED ORDER — TORSEMIDE 20 MG PO TABS
20.0000 mg | ORAL_TABLET | Freq: Every day | ORAL | Status: DC
  Administered 2016-07-27: 20 mg via ORAL
  Filled 2016-07-27: qty 1

## 2016-07-27 NOTE — Progress Notes (Signed)
07/27/2016  4:04 PM   Pt complained of jaw pain after ambulating around nurse's station x 1.  Notified attending MD Gouru who suggested notifying cardiologist.  Spoke with cardiologist PA Christell Faith who suggested repeat troponin check and pt not ambulating in hall until further notice.  Will follow orders, continue to monitor and assess. Dola Argyle, RN

## 2016-07-27 NOTE — Progress Notes (Addendum)
Chaplain made a followup visit with pt he had seen 2 days ago. Pt did not appear anxious as she was on Wednesday and was optimistic about the progress she has made with treatment so far. Pt's sister was at the bedside. Pt stated that her daughter was more worried about her health and that she was tearful. Pt requested for prayers, which the [facility] provided. Pt told Long that she might be discharged tomorrow. Ch to follow up pt as needed.

## 2016-07-27 NOTE — Care Management Important Message (Signed)
Important Message  Patient Details  Name: [name] MRN: [id number] Date of Birth: [birth year]   Medicare Important Message Given:  Yes    Beverly Sessions, RN 07/27/2016, 3:26 PM

## 2016-07-27 NOTE — Progress Notes (Signed)
[place] at [place] NAME: [name]    MR#:  [id number]  DATE OF BIRTH:  03/29/1945  SUBJECTIVE: Admitted for COPD exacerbation. She has shortness of breath, lower extremity edema. Patient stopped her Lasix recently because of dehydration but she thinks it she needs to be on Lasix again and complains of swelling in the legs and she is very anxious..   CHIEF COMPLAINT:   Chief Complaint  Patient presents with  . Shortness of Breath   Shortness of breath is better still wheezing. Lower extremity swelling is improving with Lasix REVIEW OF SYSTEMS:   ROS CONSTITUTIONAL: No fever, fatigue or weakness.  EYES: No blurred or double vision.  EARS, NOSE, AND THROAT: No tinnitus or ear pain.  RESPIRATORY: Cough, shortness of breath.  CARDIOVASCULAR: No chest pain,Does have orthopnea, PND.  GASTROINTESTINAL: No nausea, vomiting, diarrhea or abdominal pain.  GENITOURINARY: No dysuria, hematuria.  ENDOCRINE: No polyuria, nocturia,  HEMATOLOGY: No anemia, easy bruising or bleeding SKIN: No rash or lesion. MUSCULOSKELETAL: No joint pain or arthritis.   NEUROLOGIC: No tingling, numbness, weakness.  PSYCHIATRY: No anxiety or depression.   DRUG ALLERGIES:   Allergies  Allergen Reactions  . Ceftin [Cefuroxime Axetil] Diarrhea    diarrhea  . Codeine Rash       . Penicillins Rash    Has patient had a PCN reaction causing immediate rash, facial/tongue/throat swelling, SOB or lightheadedness with hypotension: YES Has patient had a PCN reaction causing severe rash involving mucus membranes or skin necrosis: NO Has patient had a PCN retioion that required hospitalization NO Has patient had a PCN reaction occurring within the last 10 years: YES If all of the above answers are "NO", then may proceed with Cephalosporin use.  . Vicodin [Hydrocodone-Acetaminophen] Rash     Also passes out    VITALS:  Blood pressure (!) 172/84, pulse 81,  temperature 98 F (36.7 C), temperature source Oral, resp. rate 18, height 5\' 3"  (1.6 m), weight 100.5 kg (221 lb 9.6 oz), SpO2 96 %.  PHYSICAL EXAMINATION:  GENERAL:  71 y.o.-year-old patient lying in the bed with no acute distress.  EYES: Pupils equal, round, reactive to light. No scleral icterus. Extraocular muscles intact.  HEENT: Head atraumatic, normocephalic. Oropharynx and nasopharynx clear.  NECK:  Supple, no jugular venous distention. No thyroid enlargement, no tenderness.  LUNGS: Coarse breath sounds with diffuse bilateral wheezing. No rubs CARDIOVASCULAR: S1, S2 normal. No murmurs, rubs ABDOMEN: Soft, nontender, nondistended. Bowel sounds present. No organomegaly or mass.  EXTREMITIES: Trace pitting edema bilaterally. NEUROLOGIC: Cranial nerves II through XII are intact. Muscle strength 5/5 in all extremities. Sensation intact. Gait not checked.  PSYCHIATRIC: The patient is alert and oriented x 3.  SKIN: No obvious rash, lesion, or ulcer.    LABORATORY PANEL:   CBC  Recent Labs Lab 07/27/16 0507  WBC 13.2*  HGB 13.8  HCT 39.7  PLT 259   ------------------------------------------------------------------------------------------------------------------  Chemistries   Recent Labs Lab 07/27/16 0507  NA 138  K 3.7  CL 96*  CO2 33*  GLUCOSE 204*  BUN 46*  CREATININE 1.27*  CALCIUM 9.5  MG 2.1   ------------------------------------------------------------------------------------------------------------------  Cardiac Enzymes  Recent Labs Lab 07/27/16 1041  TROPONINI <0.03   ------------------------------------------------------------------------------------------------------------------  RADIOLOGY:  Dg Chest 2 View  Result Date: 07/27/2016 CLINICAL DATA:  Patient reports SOB and lower extremity swelling. Hx bronchitis, CHF, afib, HTN, DM, cardiac cath. Non-smoker. EXAM: CHEST  2 VIEW COMPARISON:  Chest x-rays dated 07/23/2016 and 07/23/2011. FINDINGS:  Heart size and mediastinal contours stable. Lungs are clear. No pleural effusion or pneumothorax seen. Elevation of the right hemidiaphragm is chronic. No acute or suspicious osseous finding. IMPRESSION: No active cardiopulmonary disease. No evidence of pneumonia or pulmonary edema. Electronically Signed   By: Franki Cabot M.D.   On: 07/27/2016 11:59    EKG:   Orders placed or performed during the hospital encounter of 07/23/16  . ED EKG within 10 minutes  . ED EKG within 10 minutes  . EKG 12-Lead  . EKG 12-Lead    ASSESSMENT AND PLAN:   #1/acute respiratory failure with hypoxia due to acute bronchitis, Acute CHF exacerbation COPD exacerbation. , steroids, nebulizers initially required BiPAP in the emergency room. Hypoxia resolving, room air sats are 95% today.   # Acute on chronic diastolic heart failure:  BNP normal chest x-ray initially was negative. Repeat chest x-ray no acute findings no pulmonary edema Clinically improved with IV Lasix, currently on Demadexwith potassium supplements as needed Continue metoprolol. Monitor renal function closely. Creatinine is slowly worsening, follow-up a.m. labs Monitor intake and output and daily weights Repeat echocardiogram 60-65% ejection fraction. Systolic function was normal abnormal left ventricular relaxation Last echo in the records is only from 2009. Saw DR.,Gollan  January of this year.   #diabetes mellitus type 2 uncontrolled: Lantus 35 units subcutaneously once daily AdddedNovoLog 8 units 3 times a day while she is on steroids.   #Hypothyroidism  continue Synthroid  #  sinus tachycardia due to respiratory distress: Resolved. PT evaluation  All the records are reviewed and case discussed with Care Management/Social Workerr. Management plans discussed with the patient, family and they are in agreement.  CODE STATUS: full  TOTAL TIME TAKING CARE OF THIS PATIENT: 35 minutes.   POSSIBLE D/C IN 1-2DAYS, DEPENDING ON CLINICAL  CONDITION.   Nicholes Mango M.D on 07/27/2016 at 3:35 PM  Between 7am to 6pm - Pager - [phone]  After 6pm go to [email or web address] - password [email or web address] [place] [facility]  Office  [phone]  CC: Primary care physician; Rusty Aus, MD   Note: This dictation was prepared with Dragon dictation along with smaller phrase technology. Any transcriptional errors that result from this process are unintentional.

## 2016-07-27 NOTE — Progress Notes (Signed)
Patient Name: [name] Date of Encounter: 07/27/2016  Primary Cardiologist: [facility] Problem List     Principal Problem:   Acute respiratory distress Active Problems:   Acute bronchitis   Acute on chronic combined systolic and diastolic CHF (congestive heart failure) (HCC)   Hypertensive heart disease   AKI (acute kidney injury) ([place])   Morbid obesity ([place])   Physical deconditioning     Subjective   SOB continues to improve. Ambulated without issues. Net - 6.8 L for the admission. Weight down to 221 from 225. Renal function starting to level off this morning. Wants to go home.   Inpatient Medications    Scheduled Meds: . acidophilus  1 capsule Oral Daily  . aspirin EC  81 mg Oral Daily  . enoxaparin (LOVENOX) injection  40 mg Subcutaneous Q24H  . furosemide  40 mg Intravenous Q12H  . gabapentin  300 mg Oral QID  . hydrOXYzine  50 mg Oral BID  . insulin aspart  0-15 Units Subcutaneous TID WC  . insulin aspart  0-5 Units Subcutaneous QHS  . insulin aspart  8 Units Subcutaneous TID WC  . insulin glargine  35 Units Subcutaneous Daily  . ipratropium-albuterol  3 mL Nebulization Q4H  . levothyroxine  75 mcg Oral QAC breakfast  . methylPREDNISolone (SOLU-MEDROL) injection  40 mg Intravenous Q12H  . metoprolol tartrate  50 mg Oral BID  . pantoprazole  40 mg Oral Daily  . ropinirole  5 mg Oral BID  . temazepam  30 mg Oral QHS   Continuous Infusions:  PRN Meds: acetaminophen **OR** acetaminophen, albuterol, benzonatate, guaiFENesin-dextromethorphan, hyoscyamine, ondansetron **OR** ondansetron (ZOFRAN) IV, polyethylene glycol, traMADol   Vital Signs    Vitals:   07/26/16 2041 07/27/16 0104 07/27/16 0436 07/27/16 0539  BP: (!) 155/61  132/62   Pulse: 70  66   Resp: 18  20   Temp: 97.7 F (36.5 C)  98 F (36.7 C)   TempSrc: Oral  Oral   SpO2: 94% 93% 94% 93%  Weight:    221 lb 9.6 oz (100.5 kg)  Height:        Intake/Output Summary (Last 24  hours) at 07/27/16 0721 Last data filed at 07/27/16 7371  Gross per 24 hour  Intake              840 ml  Output             2500 ml  Net            -1660 ml   Filed Weights   07/25/16 0553 07/26/16 0434 07/27/16 0539  Weight: 225 lb 8 oz (102.3 kg) 222 lb 11.2 oz (101 kg) 221 lb 9.6 oz (100.5 kg)    Physical Exam    GEN: Well nourished, well developed, in no acute distress.  HEENT: Grossly normal.  Neck: Supple, no JVD, carotid bruits, or masses. Cardiac: RRR, no murmurs, rubs, or gallops. No clubbing, cyanosis, edema.  Radials/DP/PT 2+ and equal bilaterally.  Respiratory:  Breath sounds continue to improve. Wheezing persists.  GI: Soft, nontender, nondistended, BS + x 4. MS: no deformity or atrophy. Skin: warm and dry, no rash. Neuro:  Strength and sensation are intact. Psych: AAOx3.  Normal affect.  Labs    CBC  Recent Labs  07/27/16 0507  WBC 13.2*  HGB 13.8  HCT 39.7  MCV 83.7  PLT 062   Basic Metabolic Panel  Recent Labs  07/26/16 1026 07/27/16 0507  NA 136 138  K 3.8 3.7  CL 95* 96*  CO2 29 33*  GLUCOSE 277* 204*  BUN 40* 46*  CREATININE 1.25* 1.27*  CALCIUM 9.9 9.5  MG  --  2.1   Liver Function Tests No results for input(s): AST, ALT, ALKPHOS, BILITOT, PROT, ALBUMIN in the last 72 hours. No results for input(s): LIPASE, AMYLASE in the last 72 hours. Cardiac Enzymes No results for input(s): CKTOTAL, CKMB, CKMBINDEX, TROPONINI in the last 72 hours. BNP Invalid input(s): POCBNP D-Dimer No results for input(s): DDIMER in the last 72 hours. Hemoglobin A1C No results for input(s): HGBA1C in the last 72 hours. Fasting Lipid Panel No results for input(s): CHOL, HDL, LDLCALC, TRIG, CHOLHDL, LDLDIRECT in the last 72 hours. Thyroid Function Tests No results for input(s): TSH, T4TOTAL, T3FREE, THYROIDAB in the last 72 hours.  Invalid input(s): FREET3  Telemetry    NSR, 60s bpm - Personally Reviewed  ECG    n/a - Personally Reviewed  Radiology     No results found.  Cardiac Studies   TTE 07/26/2016: Study Conclusions  - Procedure narrative: Transthoracic echocardiography. The study   was technically difficult. - Left ventricle: The cavity size was normal. There was mild   concentric hypertrophy. Systolic function was normal. The   estimated ejection fraction was in the range of 60% to 65%. Wall   motion was normal; there were no regional wall motion   abnormalities. Doppler parameters are consistent with abnormal   left ventricular relaxation (grade 1 diastolic dysfunction). - Left atrium: The atrium was normal in size. - Right ventricle: Systolic function was normal. - Pulmonary arteries: Systolic pressure could not be accurately   estimated.  Patient Profile     71 y.o. female with history of nonobstructive CAD by LHC in 07/2011, hypertensive heart disease with chronic diastolic CHF, DM, obesity, OSA on CPAP, HTN, HLD with statin [place] on Praluent, PVD, vertigo, venous insufficiency, hypothyroidism, chronic back pain s/p surgery in [id number]/8295 complicated by post-op infection with E coli sepsis requiring drain, PICC line, and IV antibiotics who presented to [facility] on 6/11 with 3 day history of worsening LE swelling and SOB.   Assessment & Plan    1. Acute respiratory distress: -Likely multifactorial including acute bronchitis with treatment with IV steroids with acute on chronic diastolic CHF in the setting of hypertensive heart disease -Initially required BiPAP, now weaned to room air -As below  2. Acute on chronic diastolic CHF: -Much improved -Likely exacerbated by stopping torsemide in the setting AKI in mid May, along with dietary indiscretions while at the beach, as well as acute bronchitis therapy with steroids -Good urine output with IV Lasix 40 twice a day started on 6/13, would continue IV diuresis today and possibly this afternoon pending labs. She does appear to be somewhat mildly volume overloaded this  morning though significantly improved. Documented urine output of 4.8 L for the admission. Weight down to 221 pounds from 225 pounds -Transition to PO torsemide 20 mg daily with KCl repletion -TTE as above -BNP normal -IM ordering CXR -Will likely need some diuretic at time of discharge, though will need to monitor her renal function closely as it appears she drys up pretty quickly   3. Hypertensive heart disease: -Blood pressure improved -Continue Lopressor to 50 mg bid -Torsemide as above -May need to add further antihypertensive, though has previously ran on the low side with added medications  4. AKI: -Stable -Monitor with diuresis   5. Acute bronchitis: -  Per IM -Consider weaning steroids if able given the above  6. Morbid obesity/deconditoning: -Would benefit from pulmonary rehab  Signed, Marcille Blanco [place] Pager: [phone] 07/27/2016, 7:21 AM

## 2016-07-27 NOTE — Progress Notes (Signed)
Inpatient Diabetes Program Recommendations  AACE/ADA: New Consensus Statement on Inpatient Glycemic Control (2015)  Target Ranges:  Prepandial:   less than 140 mg/dL      Peak postprandial:   less than 180 mg/dL (1-2 hours)      Critically ill patients:  140 - 180 mg/dL   Lab Results  Component Value Date   GLUCAP 312 (H) 07/26/2016   HGBA1C 6.3 (H) 07/23/2016    Review of Glycemic Control      Diabetes history: DM2  Outpatient Diabetes medications: Lantus 30 units QAM, Novolog 10 units with breakfast, 10 units with lunch, and 16 units with supper  Current orders for Inpatient glycemic control: Lantus 35 units daily, Novolog 0-15 units TID with meals, Novolog 0-5 units QHS, Novolog 8 units TID with meals  Inpatient Diabetes Program Recommendations:   Solumedrol 40 mg Q12H ordered- tapering and fasting blood sugar improved with Lantus 35 units given yesterday.   Agree with current medications for blood sugar management.   Gentry Fitz, RN, BA, MHA, CDE Diabetes Coordinator Inpatient Diabetes Program  [phone] (Team Pager) [phone] ([place]) 07/27/2016 7:59 AM

## 2016-07-28 LAB — CBC
HCT: 39.1 % (ref 35.0–47.0)
Hemoglobin: 13.5 g/dL (ref 12.0–16.0)
MCH: 28.2 pg (ref 26.0–34.0)
MCHC: 34.5 g/dL (ref 32.0–36.0)
MCV: 81.7 fL (ref 80.0–100.0)
Platelets: 260 K/uL (ref 150–440)
RBC: 4.79 MIL/uL (ref 3.80–5.20)
RDW: 14.2 % (ref 11.5–14.5)
WBC: 10.4 K/uL (ref 3.6–11.0)

## 2016-07-28 LAB — GLUCOSE, CAPILLARY
Glucose-Capillary: 188 mg/dL — ABNORMAL HIGH (ref 65–99)
Glucose-Capillary: 239 mg/dL — ABNORMAL HIGH (ref 65–99)

## 2016-07-28 LAB — BASIC METABOLIC PANEL
Anion gap: 10 (ref 5–15)
BUN: 49 mg/dL — ABNORMAL HIGH (ref 6–20)
CHLORIDE: 95 mmol/L — AB (ref 101–111)
CO2: 31 mmol/L (ref 22–32)
Calcium: 9.3 mg/dL (ref 8.9–10.3)
Creatinine, Ser: 1.32 mg/dL — ABNORMAL HIGH (ref 0.44–1.00)
GFR calc non Af Amer: 40 mL/min — ABNORMAL LOW (ref >60)
GFR, EST AFRICAN AMERICAN: 46 mL/min — AB (ref >60)
Glucose, Bld: 197 mg/dL — ABNORMAL HIGH (ref 65–99)
POTASSIUM: 3.6 mmol/L (ref 3.5–5.1)
SODIUM: 136 mmol/L (ref 135–145)

## 2016-07-28 LAB — TROPONIN I: Troponin I: <0.03 ng/mL (ref <0.03)

## 2016-07-28 MED ORDER — IPRATROPIUM-ALBUTEROL 0.5-2.5 (3) MG/3ML IN SOLN
3.0000 mL | Freq: Four times a day (QID) | RESPIRATORY_TRACT | Status: DC
  Filled 2016-07-28: qty 3

## 2016-07-28 MED ORDER — METOPROLOL TARTRATE 50 MG PO TABS: 50.0000 mg | ORAL_TABLET | Freq: Two times a day (BID) | ORAL | 0 refills | Status: DC

## 2016-07-28 MED ORDER — TORSEMIDE 20 MG PO TABS: 20.0000 mg | ORAL_TABLET | Freq: Every day | ORAL | Status: DC

## 2016-07-28 MED ORDER — LISINOPRIL 5 MG PO TABS: 5.0000 mg | ORAL_TABLET | Freq: Every day | ORAL | Status: DC

## 2016-07-28 MED ORDER — ALBUTEROL SULFATE HFA 108 (90 BASE) MCG/ACT IN AERS: 2.0000 | INHALATION_SPRAY | Freq: Four times a day (QID) | RESPIRATORY_TRACT | 0 refills | Status: DC | PRN

## 2016-07-31 NOTE — Discharge Summary (Signed)
[facility] [place] NAME: [name]    MR#:  [id number]  DATE OF BIRTH:  1945/09/16  DATE OF ADMISSION:  07/23/2016 ADMITTING PHYSICIAN: Hillary Bow, MD  DATE OF DISCHARGE: 07/28/2016  2:52 PM  PRIMARY CARE PHYSICIAN: Rusty Aus, MD   ADMISSION DIAGNOSIS:  Hypoxia [R09.02] COPD exacerbation ([place]) [J44.1] Syncope, unspecified syncope type [R55]  DISCHARGE DIAGNOSIS:  Principal Problem:   Acute respiratory distress Active Problems:   Acute bronchitis   Acute on chronic combined systolic and diastolic CHF (congestive heart failure) (HCC)   Hypertensive heart disease   AKI (acute kidney injury) ([place])   Morbid obesity ([place])   Physical deconditioning   SECONDARY DIAGNOSIS:   Past Medical History:  Diagnosis Date  . Anxiety   . Arthritis   . Chronic back pain    stenosis.degenerative disc,some scoliosis  . Constipation    takes Stool Softener daily  . Coronary artery disease   . Depression    takes Cymbalta daily  . Diabetes mellitus    Type 2 diabetic. Average fasting blood sugar runs high 170-200  . Diverticulosis   . E coli infection   . GERD (gastroesophageal reflux disease)    takes Nexium daily  . Headache   . Headache 02/16/2016  . Hemorrhoids   . History of colon polyps    benign  . History of gout    doesn't take any meds  . History of hiatal hernia   . History of vertigo    doesn't take any meds  . Hyperlipidemia    takes Praluent daily  . Hypertension    currently BP medications are on hold   . Hypothyroidism    takes Synthroid daily  . Insomnia    takes Restoril nightly  . Joint pain   . Joint swelling   . Muscle spasm    takes Robaxin as needed  . OSA on CPAP   . Periodic heart flutter   . Peripheral edema    takes LAsix as needed  . Peripheral vascular disease ([place])    AAA as stated per pt / was just discovered and pt states has not been referred to vascular MD   . Restless leg    takes  Requip daily  . Rosacea   . Sleep apnea   . Urinary incontinence   . Urinary urgency   . Varicose veins   . Weakness    numbness and tingling.     ADMITTING HISTORY  HISTORY OF PRESENT ILLNESS:  [name]  is a 71 y.o. female with a known history of CAD, HTN, DM here from [facility] clinic with 3 days of wheezing, SOB and cough. Had tachycardia and sats in 70s at [facility] clinic and sent to ED. Her patient has been placed on Bipap.  Green sputum, subjective fever at home with chills. CXR shows no PNA or CHF   HOSPITAL COURSE:   # acute respiratory failure with hypoxia Due to COPD exacerbation and diastolic CHF exacerbation. She was initially on BiPAP briefly in the emergency room and later transitioned slowly to room air. Nebulizers when necessary.  # Acute COPD exacerbation. She was on IV steroids along with nebulizers. At time of discharge she has no further wheezing and steroids are being stopped. Continue inhalers at home.  # Acute on chronic diastolic heart failure:  Patient's chest x-ray did not show any pulmonary edema but she had significant fluid overload. Started on IV Lasix  and diuresed well. A day prior to discharge she was transitioned to oral torsemide. Her renal function has remained stable. Cardiology has seen the patient. Echocardiogram showed ejection fraction 60%. She will follow up with cardiology as outpatient. At discharge she is on torsemide along with potassium supplements. CHF instructions given.  # Insulin-dependent diabetes mellitus Patient was continued on her Lantus. During the hospital stay with IV steroids she was placed on pre-meal lower lung. Does not need this at discharge. No steroids at discharge.  #Hypothyroidism  continue Synthroid  #  sinus tachycardia due to respiratory distress: Resolved.  Stable for discharge home. Home health PT and nursing set up  CONSULTS OBTAINED:    DRUG ALLERGIES:   Allergies  Allergen Reactions  . Ceftin  [Cefuroxime Axetil] Diarrhea    diarrhea  . Codeine Rash       . Penicillins Rash    Has patient had a PCN reaction causing immediate rash, facial/tongue/throat swelling, SOB or lightheadedness with hypotension: YES Has patient had a PCN reaction causing severe rash involving mucus membranes or skin necrosis: NO Has patient had a PCN retioion that required hospitalization NO Has patient had a PCN reaction occurring within the last 10 years: YES If all of the above answers are "NO", then may proceed with Cephalosporin use.  . Vicodin [Hydrocodone-Acetaminophen] Rash     Also passes out    DISCHARGE MEDICATIONS:   Discharge Medication List as of 07/28/2016  2:06 PM    START taking these medications   Details  albuterol (PROVENTIL HFA;VENTOLIN HFA) 108 (90 Base) MCG/ACT inhaler Inhale 2 puffs into the lungs every 6 (six) hours as needed for wheezing or shortness of breath., Starting Sat 07/28/2016, Normal    metoprolol tartrate (LOPRESSOR) 50 MG tablet Take 1 tablet (50 mg total) by mouth 2 (two) times daily., Starting Sat 07/28/2016, Normal    torsemide (DEMADEX) 20 MG tablet Take 1 tablet (20 mg total) by mouth daily., Starting Sat 07/28/2016, No Print      CONTINUE these medications which have CHANGED   Details  lisinopril (PRINIVIL,ZESTRIL) 5 MG tablet Take 1 tablet (5 mg total) by mouth daily., Starting Sat 07/28/2016, No Print      CONTINUE these medications which have NOT CHANGED   Details  acidophilus (RISAQUAD) CAPS capsule Take 1 capsule by mouth daily., Starting Mon 06/25/2016, Print    Alirocumab (PRALUENT) 150 MG/ML SOPN Inject 1 pen into the skin every 14 (fourteen) days., Starting Thu 01/12/2016, Normal    aspirin 81 MG tablet Take 81 mg by mouth daily.  , Historical Med    cholecalciferol (VITAMIN D) 1000 UNITS tablet Take 1,000 Units by mouth daily., Historical Med    desonide (DESOWEN) 0.05 % cream Apply 1 application topically 2 (two) times daily., Historical Med     esomeprazole (NEXIUM) 40 MG capsule Take 40 mg by mouth 3 (three) times daily. , Historical Med    gabapentin (NEURONTIN) 300 MG capsule Take 300 mg by mouth 4 (four) times daily. , Historical Med    hydrOXYzine (ATARAX/VISTARIL) 50 MG tablet Take 50 mg by mouth 2 (two) times daily. , Starting Mon 05/26/2012, Historical Med    hyoscyamine (LEVSIN SL) 0.125 MG SL tablet Place 2 tablets (0.25 mg total) under the tongue every 4 (four) hours as needed for cramping., Starting Mon 06/25/2016, Print    insulin aspart (NOVOLOG) 100 UNIT/ML FlexPen Inject 10 Units into the skin See admin instructions. 10 units before breakfast, 10 units before lunch,  16 units at dinner, >200 increase by 2 units per sliding scale, Starting Tue 03/01/2015, Until Tue 07/23/2017, Historical Med    insulin glargine (LANTUS) 100 UNIT/ML injection Inject 30 Units into the skin daily. Take every morning , Historical Med    levothyroxine (SYNTHROID, LEVOTHROID) 75 MCG tablet Take 75 mcg by mouth daily before breakfast. , Starting Mon 11/24/2012, Historical Med    nitroGLYCERIN (NITROSTAT) 0.4 MG SL tablet Place 1 tablet (0.4 mg total) under the tongue every 5 (five) minutes as needed for chest pain., Starting Tue 03/06/2016, Until Wed 09/18/2017, Normal    ondansetron (ZOFRAN) 4 MG tablet Take 1 tablet (4 mg total) by mouth every 6 (six) hours as needed for nausea., Starting Mon 06/25/2016, Print    ropinirole (REQUIP) 5 MG tablet Take 5 mg by mouth 2 (two) times daily., Historical Med    temazepam (RESTORIL) 30 MG capsule Take 30 mg by mouth at bedtime., Historical Med    traMADol (ULTRAM) 50 MG tablet Take 50 mg by mouth 3 (three) times daily as needed for moderate pain., Historical Med      STOP taking these medications     metoprolol succinate (TOPROL XL) 25 MG 24 hr tablet      isosorbide mononitrate (IMDUR) 30 MG 24 hr tablet         Today   VITAL SIGNS:  Blood pressure (!) 132/56, pulse 62, temperature 98 F  (36.7 C), temperature source Oral, resp. rate 18, height 5\' 3"  (1.6 m), weight 99.5 kg (219 lb 6.4 oz), SpO2 95 %.  I/O:  No intake or output data in the 24 hours ending 07/31/16 1112  PHYSICAL EXAMINATION:  Physical Exam  GENERAL:  71 y.o.-year-old patient lying in the bed with no acute distress.  LUNGS: Normal breath sounds bilaterally, no wheezing, rales,rhonchi or crepitation. No use of accessory muscles of respiration.  CARDIOVASCULAR: S1, S2 normal. No murmurs, rubs, or gallops.  ABDOMEN: Soft, non-tender, non-distended. Bowel sounds present. No organomegaly or mass.  NEUROLOGIC: Moves all 4 extremities. PSYCHIATRIC: The patient is alert and oriented x 3.  SKIN: No obvious rash, lesion, or ulcer.   DATA REVIEW:   CBC  Recent Labs Lab 07/28/16 0345  WBC 10.4  HGB 13.5  HCT 39.1  PLT 260    Chemistries   Recent Labs Lab 07/27/16 0507 07/28/16 0345  NA 138 136  K 3.7 3.6  CL 96* 95*  CO2 33* 31  GLUCOSE 204* 197*  BUN 46* 49*  CREATININE 1.27* 1.32*  CALCIUM 9.5 9.3  MG 2.1  --     Cardiac Enzymes  Recent Labs Lab 07/28/16 0345  TROPONINI <0.03    Microbiology Results  Results for orders placed or performed during the hospital encounter of 06/24/16  Urine culture     Status: Abnormal   Collection Time: 06/23/16 11:44 PM  Result Value Ref Range Status   Specimen Description URINE, RANDOM  Final   Special Requests NONE  Final   Culture 50,000 COLONIES/mL YEAST (A)  Final   Report Status 06/25/2016 FINAL  Final  C difficile quick scan w PCR reflex     Status: None   Collection Time: 06/24/16  1:00 PM  Result Value Ref Range Status   C Diff antigen NEGATIVE NEGATIVE Final   C Diff toxin NEGATIVE NEGATIVE Final   C Diff interpretation No C. difficile detected.  Final  Gastrointestinal Panel by PCR , Stool     Status: None   Collection  Time: 06/24/16  1:00 PM  Result Value Ref Range Status   Campylobacter species NOT DETECTED NOT DETECTED Final    Plesimonas shigelloides NOT DETECTED NOT DETECTED Final   Salmonella species NOT DETECTED NOT DETECTED Final   Yersinia enterocolitica NOT DETECTED NOT DETECTED Final   Vibrio species NOT DETECTED NOT DETECTED Final   Vibrio cholerae NOT DETECTED NOT DETECTED Final   Enteroaggregative E coli (EAEC) NOT DETECTED NOT DETECTED Final   Enteropathogenic E coli (EPEC) NOT DETECTED NOT DETECTED Final   Enterotoxigenic E coli (ETEC) NOT DETECTED NOT DETECTED Final   Shiga like toxin producing E coli (STEC) NOT DETECTED NOT DETECTED Final   Shigella/Enteroinvasive E coli (EIEC) NOT DETECTED NOT DETECTED Final   Cryptosporidium NOT DETECTED NOT DETECTED Final   Cyclospora cayetanensis NOT DETECTED NOT DETECTED Final   Entamoeba histolytica NOT DETECTED NOT DETECTED Final   Giardia lamblia NOT DETECTED NOT DETECTED Final   Adenovirus F40/41 NOT DETECTED NOT DETECTED Final   Astrovirus NOT DETECTED NOT DETECTED Final   Norovirus GI/GII NOT DETECTED NOT DETECTED Final   Rotavirus A NOT DETECTED NOT DETECTED Final   Sapovirus (I, II, IV, and V) NOT DETECTED NOT DETECTED Final    RADIOLOGY:  No results found.  Follow up with PCP in 1 week.  Management plans discussed with the patient, family and they are in agreement.  CODE STATUS:  Code Status History    Date Active Date Inactive Code Status Order ID Comments User Context   07/23/2016  8:25 PM 07/28/2016  5:57 PM Full Code [id number]  Hillary Bow, MD ED   06/24/2016  7:10 AM 06/25/2016  7:17 PM Full Code [id number]  Saundra Shelling, MD Inpatient   11/14/2015  6:33 PM 11/18/2015  6:51 PM Full Code [id number]  Newman Pies, MD Inpatient   09/12/2015 10:48 AM 09/13/2015  3:15 AM Full Code [id number]  Logan Bores, MD HOV    Advance Directive Documentation     Most Recent Value  Type of Advance Directive  Healthcare Power of Attorney, Living will  Pre-existing out of facility DNR order (yellow form or pink MOST form)  -  "MOST" Form in Place?  -       TOTAL TIME TAKING CARE OF THIS PATIENT ON DAY OF DISCHARGE: more than 30 minutes.   Hillary Bow R M.D on 07/31/2016 at 11:12 AM  Between 7am to 6pm - Pager - [phone]  After 6pm go to [email or web address] - password [email or web address] [place] [facility]  Office  [phone]  CC: Primary care physician; Rusty Aus, MD  Note: This dictation was prepared with Dragon dictation along with smaller phrase technology. Any transcriptional errors that result from this process are unintentional.

## 2016-08-02 DIAGNOSIS — I4729 Other ventricular tachycardia: Secondary | ICD-10-CM | POA: Insufficient documentation

## 2016-08-10 ENCOUNTER — Ambulatory Visit: Payer: Medicare Other | Attending: Family | Admitting: Family

## 2016-08-10 ENCOUNTER — Encounter: Payer: Self-pay | Admitting: Family

## 2016-08-10 VITALS — BP 125/52 | HR 79 | Resp 20 | Ht 63.0 in | Wt 223.2 lb

## 2016-08-10 DIAGNOSIS — E039 Hypothyroidism, unspecified: Secondary | ICD-10-CM | POA: Diagnosis not present

## 2016-08-10 DIAGNOSIS — Z885 Allergy status to narcotic agent status: Secondary | ICD-10-CM | POA: Diagnosis not present

## 2016-08-10 DIAGNOSIS — Z79899 Other long term (current) drug therapy: Secondary | ICD-10-CM | POA: Insufficient documentation

## 2016-08-10 DIAGNOSIS — I110 Hypertensive heart disease with heart failure: Secondary | ICD-10-CM | POA: Insufficient documentation

## 2016-08-10 DIAGNOSIS — E785 Hyperlipidemia, unspecified: Secondary | ICD-10-CM | POA: Diagnosis not present

## 2016-08-10 DIAGNOSIS — K219 Gastro-esophageal reflux disease without esophagitis: Secondary | ICD-10-CM | POA: Diagnosis not present

## 2016-08-10 DIAGNOSIS — I2510 Atherosclerotic heart disease of native coronary artery without angina pectoris: Secondary | ICD-10-CM | POA: Insufficient documentation

## 2016-08-10 DIAGNOSIS — I10 Essential (primary) hypertension: Secondary | ICD-10-CM

## 2016-08-10 DIAGNOSIS — Z880 Allergy status to penicillin: Secondary | ICD-10-CM | POA: Diagnosis not present

## 2016-08-10 DIAGNOSIS — Z7982 Long term (current) use of aspirin: Secondary | ICD-10-CM | POA: Diagnosis not present

## 2016-08-10 DIAGNOSIS — I472 Ventricular tachycardia: Secondary | ICD-10-CM | POA: Insufficient documentation

## 2016-08-10 DIAGNOSIS — F329 Major depressive disorder, single episode, unspecified: Secondary | ICD-10-CM | POA: Diagnosis not present

## 2016-08-10 DIAGNOSIS — Z794 Long term (current) use of insulin: Secondary | ICD-10-CM | POA: Insufficient documentation

## 2016-08-10 DIAGNOSIS — F419 Anxiety disorder, unspecified: Secondary | ICD-10-CM | POA: Diagnosis not present

## 2016-08-10 DIAGNOSIS — E1159 Type 2 diabetes mellitus with other circulatory complications: Secondary | ICD-10-CM

## 2016-08-10 DIAGNOSIS — G4733 Obstructive sleep apnea (adult) (pediatric): Secondary | ICD-10-CM | POA: Insufficient documentation

## 2016-08-10 DIAGNOSIS — I5032 Chronic diastolic (congestive) heart failure: Secondary | ICD-10-CM | POA: Diagnosis present

## 2016-08-10 DIAGNOSIS — M109 Gout, unspecified: Secondary | ICD-10-CM | POA: Diagnosis not present

## 2016-08-10 MED ORDER — TORSEMIDE 20 MG PO TABS: 40.0000 mg | ORAL_TABLET | Freq: Every day | ORAL | 5 refills | Status: DC

## 2016-08-10 MED ORDER — POTASSIUM CHLORIDE CRYS ER 20 MEQ PO TBCR: 40.0000 meq | EXTENDED_RELEASE_TABLET | Freq: Every day | ORAL | 3 refills | Status: DC

## 2016-08-10 NOTE — Progress Notes (Signed)
Patient ID: [name], female    DOB: 1945-04-25, 71 y.o.   MRN: [id number]  HPI  [name] is a 71 y/o female with a history of anxiety, CAD, depression, DM, GERD, gout, hyperlipidemia, HTN, hypothyroidism, obstructive sleep apnea (CPAP), PVD, NSVT and chronic heart failure.   Reviewed last echo report from 07/26/16 which showed an EF of 60-65%.  Admitted 07/23/16 due to COPD/HF exacerbation. Initially needed bipap and then transitioned to room air. Nebulizers PRN along with IV steroids. Initially needed IV diuretics. Cardiology consult obtained. Discharged home after 5 days with home health nursing and physical therapy. Admitted 06/24/16 with left lower quadrant abdominal pain. Admitted overnight and diagnosed with abdominal spasms and discharged with levsin wit PCP follow-up.   She presents today for her initial visit with a chief complaint of weight gain. She has been weighing daily and says that she's gained 8 pounds in the last week. She has associated fatigue, cough, shortness of breath and pedal edema along with this.   Past Medical History:  Diagnosis Date  . Anxiety   . Arthritis   . Chronic back pain    stenosis.degenerative disc,some scoliosis  . Constipation    takes Stool Softener daily  . Coronary artery disease   . Depression    takes Cymbalta daily  . Diabetes mellitus    Type 2 diabetic. Average fasting blood sugar runs high 170-200  . Diverticulosis   . E coli infection   . GERD (gastroesophageal reflux disease)    takes Nexium daily  . Headache   . Headache 02/16/2016  . Hemorrhoids   . History of colon polyps    benign  . History of gout    doesn't take any meds  . History of hiatal hernia   . History of vertigo    doesn't take any meds  . Hyperlipidemia    takes Praluent daily  . Hypertension    currently BP medications are on hold   . Hypothyroidism    takes Synthroid daily  . Insomnia    takes Restoril nightly  . Joint pain   . Joint swelling    . Muscle spasm    takes Robaxin as needed  . OSA on CPAP   . Periodic heart flutter   . Peripheral edema    takes LAsix as needed  . Peripheral vascular disease ([place])    AAA as stated per pt / was just discovered and pt states has not been referred to vascular MD   . Restless leg    takes Requip daily  . Rosacea   . Sleep apnea   . Urinary incontinence   . Urinary urgency   . Varicose veins   . Weakness    numbness and tingling.   Past Surgical History:  Procedure Laterality Date  . ABDOMINAL HYSTERECTOMY     with BSo  . CARDIAC CATHETERIZATION  2013   Normal  . CARPAL TUNNEL RELEASE Bilateral   . CATARACT EXTRACTION, BILATERAL    . CHOLECYSTECTOMY    . COLONOSCOPY    . DIAGNOSTIC LAPAROSCOPY     multiple times  . DILATION AND CURETTAGE OF UTERUS    . ESOPHAGOGASTRODUODENOSCOPY (EGD) WITH PROPOFOL N/A 11/01/2014   Procedure: ESOPHAGOGASTRODUODENOSCOPY (EGD) WITH PROPOFOL;  Surgeon: Hulen Luster, MD;  Location: [facility] ENDOSCOPY;  Service: Gastroenterology;  Laterality: N/A;  . IMPLANTABLE CONTACT LENS IMPLANTATION     bilateral  . LAMINECTOMY  11/13/2015  . LUMBAR WOUND DEBRIDEMENT N/A 12/02/2015  Procedure: WOUND Exploration;  Surgeon: Consuella Lose, MD;  Location: [place];  Service: Neurosurgery;  Laterality: N/A;  . PICC LINE PLACE PERIPHERAL ([place] HX)     right upper arm   . ROTATOR CUFF REPAIR Left   . SAVORY DILATION N/A 11/01/2014   Procedure: SAVORY DILATION;  Surgeon: Hulen Luster, MD;  Location: [facility] ENDOSCOPY;  Service: Gastroenterology;  Laterality: N/A;  . TONSILLECTOMY    . TRIGGER FINGER RELEASE Bilateral    Family History  Problem Relation Age of Onset  . Heart disease Mother   . Breast cancer Mother 54  . Heart disease Father   . Heart attack Sister   . Heart disease Sister   . Heart disease Brother    Social History  Substance Use Topics  . Smoking status: Never Smoker  . Smokeless tobacco: Never Used  . Alcohol use No   Allergies  Allergen  Reactions  . Ceftin [Cefuroxime Axetil] Diarrhea    diarrhea  . Codeine Rash       . Penicillins Rash    Has patient had a PCN reaction causing immediate rash, facial/tongue/throat swelling, SOB or lightheadedness with hypotension: YES Has patient had a PCN reaction causing severe rash involving mucus membranes or skin necrosis: NO Has patient had a PCN retioion that required hospitalization NO Has patient had a PCN reaction occurring within the last 10 years: YES If all of the above answers are "NO", then may proceed with Cephalosporin use.  . Vicodin [Hydrocodone-Acetaminophen] Rash     Also passes out   Prior to Admission medications   Medication Sig Start Date End Date Taking? Authorizing Provider  acidophilus (RISAQUAD) CAPS capsule Take 1 capsule by mouth daily. 06/25/16  Yes Theodoro Grist, MD  albuterol (PROVENTIL HFA;VENTOLIN HFA) 108 (90 Base) MCG/ACT inhaler Inhale 2 puffs into the lungs every 6 (six) hours as needed for wheezing or shortness of breath. 07/28/16  Yes Sudini, Alveta Heimlich, MD  Alirocumab (PRALUENT) 150 MG/ML SOPN Inject 1 pen into the skin every 14 (fourteen) days. 01/12/16  Yes Minna Merritts, MD  aspirin 81 MG tablet Take 81 mg by mouth daily.     Yes [provider]  desonide (DESOWEN) 0.05 % cream Apply 1 application topically 2 (two) times daily.   Yes [provider]  esomeprazole (NEXIUM) 40 MG capsule Take 40 mg by mouth 2 (two) times daily before a meal.    Yes [provider]  gabapentin (NEURONTIN) 300 MG capsule Take 300 mg by mouth 3 (three) times daily.    Yes [provider]  hyoscyamine (LEVSIN SL) 0.125 MG SL tablet Place 2 tablets (0.25 mg total) under the tongue every 4 (four) hours as needed for cramping. 06/25/16  Yes Theodoro Grist, MD  insulin aspart (NOVOLOG) 100 UNIT/ML FlexPen Inject 10 Units into the skin See admin instructions. 10 units before breakfast, 10 units before lunch, 16 units at dinner, >200  increase by 2 units per sliding scale 03/01/15 07/23/17 Yes [provider]  insulin glargine (LANTUS) 100 UNIT/ML injection Inject 30 Units into the skin daily. Take every morning    Yes [provider]  levothyroxine (SYNTHROID, LEVOTHROID) 75 MCG tablet Take 75 mcg by mouth daily before breakfast.  11/24/12  Yes [provider]  lisinopril (PRINIVIL,ZESTRIL) 5 MG tablet Take 1 tablet (5 mg total) by mouth daily. 07/28/16  Yes Sudini, Alveta Heimlich, MD  metoprolol succinate (TOPROL-XL) 50 MG 24 hr tablet Take 25 mg by mouth daily. Take  with or immediately following a meal.    Yes [provider]  Multiple Vitamin (MULTIVITAMIN) tablet Take 1 tablet by mouth daily.   Yes [provider]  nitroGLYCERIN (NITROSTAT) 0.4 MG SL tablet Place 1 tablet (0.4 mg total) under the tongue every 5 (five) minutes as needed for chest pain. 03/06/16 09/18/17 Yes Gollan, Kathlene November, MD  ondansetron (ZOFRAN) 4 MG tablet Take 1 tablet (4 mg total) by mouth every 6 (six) hours as needed for nausea. 06/25/16  Yes Theodoro Grist, MD  ropinirole (REQUIP) 5 MG tablet Take 5 mg by mouth 2 (two) times daily.   Yes [provider]  temazepam (RESTORIL) 30 MG capsule Take 30 mg by mouth at bedtime.   Yes [provider]  torsemide (DEMADEX) 20 MG tablet Take 1 tablet (20 mg total) by mouth daily. 07/28/16  Yes Sudini, Alveta Heimlich, MD  traMADol (ULTRAM) 50 MG tablet Take 50 mg by mouth 3 (three) times daily as needed for moderate pain.   Yes [provider]    Review of Systems  Constitutional: Positive for fatigue. Negative for appetite change.  HENT: Negative for congestion, postnasal drip and sore throat.   Eyes: Negative.   Respiratory: Positive for cough (dry cough) and shortness of breath (with minimal exertion). Negative for chest tightness.   Cardiovascular: Positive for palpitations (occasionally) and leg swelling. Negative for chest pain.  Gastrointestinal:  Negative for abdominal distention and abdominal pain.  Endocrine: Negative.   Genitourinary: Negative.   Musculoskeletal: Negative for back pain and neck pain.  Skin: Negative.   Allergic/Immunologic: Negative.   Neurological: Positive for light-headedness. Negative for dizziness.  Hematological: Negative for adenopathy. Bruises/bleeds easily.  Psychiatric/Behavioral: Negative for dysphoric mood and sleep disturbance (sleeping with CPAP). The patient is not nervous/anxious.    Vitals:   08/10/16 0959  BP: (!) 125/52  Pulse: 79  Resp: 20  SpO2: 100%  Weight: 223 lb 4 oz (101.3 kg)  Height: 5\' 3"  (1.6 m)   Wt Readings from Last 3 Encounters:  08/10/16 223 lb 4 oz (101.3 kg)  07/28/16 219 lb 6.4 oz (99.5 kg)  06/24/16 223 lb 15.8 oz (101.6 kg)   Lab Results  Component Value Date   CREATININE 1.32 (H) 07/28/2016   CREATININE 1.27 (H) 07/27/2016   CREATININE 1.25 (H) 07/26/2016    Physical Exam  Constitutional: She is oriented to person, place, and time. She appears well-developed and well-nourished.  HENT:  Head: Normocephalic and atraumatic.  Neck: Normal range of motion. Neck supple. No JVD present.  Cardiovascular: Normal rate and regular rhythm.   Pulmonary/Chest: Effort normal. She has no wheezes. She has no rales.  Abdominal: Soft. She exhibits no distension. There is no tenderness.  Musculoskeletal: She exhibits edema (2+ pitting edema in bilateral lower legs). She exhibits no tenderness.  Neurological: She is alert and oriented to person, place, and time.  Skin: Skin is warm and dry.  Psychiatric: She has a normal mood and affect. Her behavior is normal. Thought content normal.  Nursing note and vitals reviewed.   Assessment & Plan:  1: Chronic heart failure with preserved ejection fraction- - NYHA class III - mildly fluid overloaded at this time - has been weighing daily and brings her weight chart for review. Has gained 8 pounds in the last 8 days. She weighed  213 pounds on 08/01/16 and 221 pounds on 08/09/16.  - has been taking torsemide 20mg  daily but says that her prescription expired sometime in 2017 -  will increase her torsemide to 40mg  daily and will also add potassium 48meq daily  - BMP drawn on 07/28/16 showed potassium 3.6 and GFR 40 which is why potassium being added with daily torsemide - uses lite salt along with greek seasoning but is unsure if there's salt in the greek seasoning or not. Discussed the importance of closely following a 2000mg  sodium diet and reviewed how to read food labels. Written dietary information was give to her as well.  - Pharm D reviewed medications with the patient - sees cardiologist Rockey Situ) 09/03/16 - advised patient that should she respond well to increased torsemide dose, that she could back down to 20mg  daily and if she does, she would also need to decrease her potassium to 20meq daily  2: HTN- - BP looks good today - she has decreased her metoprolol XL to 25mg  daily herself and says that she feels a little better since doing that - sees PCP Sabra Heck) 08/16/16; need to check BMP at that visit   3: Diabetes- - glucose this morning at home was 154 - continues to use novolog and lantus  Return here in 6 weeks or sooner for any questions/problems before then.

## 2016-08-10 NOTE — Patient Instructions (Addendum)
Continue weighing daily and call for an overnight weight gain of > 2 pounds or a weekly weight gain of >5 pounds.  Increase torsemide to 2 tablets daily (40mg  total) and will add potassium 2 tablets (2meq) total as well.  If you respond great to the increase of torsemide, decrease it back to 1 tablet daily. If you decrease it, decrease potassium as well.

## 2016-08-13 ENCOUNTER — Encounter: Payer: Self-pay | Admitting: Infectious Disease

## 2016-08-13 ENCOUNTER — Ambulatory Visit (INDEPENDENT_AMBULATORY_CARE_PROVIDER_SITE_OTHER): Payer: Medicare Other | Admitting: Infectious Disease

## 2016-08-13 ENCOUNTER — Telehealth: Payer: Self-pay | Admitting: Cardiovascular Disease

## 2016-08-13 VITALS — BP 113/71 | HR 85 | Temp 97.7°F | Ht 63.0 in | Wt 220.0 lb

## 2016-08-13 DIAGNOSIS — M5136 Other intervertebral disc degeneration, lumbar region: Secondary | ICD-10-CM

## 2016-08-13 DIAGNOSIS — A498 Other bacterial infections of unspecified site: Secondary | ICD-10-CM | POA: Diagnosis not present

## 2016-08-13 DIAGNOSIS — I2510 Atherosclerotic heart disease of native coronary artery without angina pectoris: Secondary | ICD-10-CM

## 2016-08-13 DIAGNOSIS — A4151 Sepsis due to Escherichia coli [E. coli]: Secondary | ICD-10-CM

## 2016-08-13 DIAGNOSIS — T814XXS Infection following a procedure, sequela: Secondary | ICD-10-CM

## 2016-08-13 DIAGNOSIS — I5032 Chronic diastolic (congestive) heart failure: Secondary | ICD-10-CM

## 2016-08-13 DIAGNOSIS — IMO0001 Reserved for inherently not codable concepts without codable children: Secondary | ICD-10-CM

## 2016-08-13 NOTE — Telephone Encounter (Signed)
Pt calling  She states she was in and out of hospital  They placed her on metoprolol 50 mg and she could not tolerate it So she cut it in half 25 mg at night along with her Lisinopril  She is stating taking the Renexa about 2 days ago  She started that for she was having some chest tightness when that happened she took a nitro and it helped she states  Sheran Luz like to know if there is anything else she is to do in between her coming to see [place] to prevent anything else go wrong  Please advise.

## 2016-08-13 NOTE — Progress Notes (Signed)
Subjective:  Chief complaint: has been having chest pain recently   Patient ID: [name], female    DOB: 03-Sep-1945, 71 y.o.   MRN: [id number]  HPI  71 y.o.femalewho underwent L3-S1 fusion on 10/2. She was discharged to SNF, and has been home for a few days. She was seen this  4 days pta with low-grade fevers and no significant drainage. Unfortunately, over the last 24 hours she has been having fever >101.5, developed severe HA , and has had yellow-brown drainage from her wound. When she was admitted there was concern for sepsis and BLOOD  cultures were drawn and she was started on vancomycin and Zosyn and she was taken the operating room where she underwent debridement and cultures of been taken which now are growing moderate Escherichia. He is placed on ceftriaxone by myself and has been on this antibiotic for more than 6 weeks. Her sedimentation rate is come down from 130 into the 60s but C-reactive protein is gone from 30s up to 55. Her back pain has largely improved but now she has some lower back pain with sciatic component radiation down her right thigh. I've ordered an MRI  Which showed a fluid collection present and which was sampled by IR and grew E coli yet again. He remained on the ceftriaxone but then had reaccumulation of fluid again and again underwent IR guided drainage a few days ago. That culture is still no growth to date. She has remained on the ceftriaxone. Her back pain has improved largely and actually improved most recently with drainage of the fluid was present.  At one point she  did note also a headache she did not have before and which is alleviated by lying flat which makes me concerned that she might be suffering from a CSF leak.  I ended up increasing her CTX to 2g q 12 hours and she had imporovement with higher dose CTX so we extended this for another month before changing her to bactrim. In the inteirm she had bout of severe back pain 2-3 weeks ago and was  seen by Neurosurgery who ordered MRI spine which showed:  IMPRESSION: 1. No residual or recurrent abscess.  Stable postoperative changes. 2. Stable fusion hardware from G4-W1 without complicating features. No spinal or foraminal stenosis at the fused levels. 3. Stable degenerative disc disease at T11-12 and T12-L1 with bulging discs. 4. No significant findings at L1-2 or L2-3.  Her back pain is substantially improved.   We rechecked esr and crp and they trended down and we stopped her abx.  Since I last saw her she has been hospitalized several times , most recently for CHF exacerbation. She has been having some discomfort in her middle of her  Chest that is alleviated by nitroglycerin and has communicated this to CHF RN.  Back pain is not much of an issue at present.  Past Medical History:  Diagnosis Date  . Anxiety   . Arthritis   . Chronic back pain    stenosis.degenerative disc,some scoliosis  . Constipation    takes Stool Softener daily  . Coronary artery disease   . Depression    takes Cymbalta daily  . Diabetes mellitus    Type 2 diabetic. Average fasting blood sugar runs high 170-200  . Diverticulosis   . E coli infection   . GERD (gastroesophageal reflux disease)    takes Nexium daily  . Headache   . Headache 02/16/2016  . Hemorrhoids   .  History of colon polyps    benign  . History of gout    doesn't take any meds  . History of hiatal hernia   . History of vertigo    doesn't take any meds  . Hyperlipidemia    takes Praluent daily  . Hypertension    currently BP medications are on hold   . Hypothyroidism    takes Synthroid daily  . Insomnia    takes Restoril nightly  . Joint pain   . Joint swelling   . Muscle spasm    takes Robaxin as needed  . NSVT (nonsustained ventricular tachycardia) ([place])   . OSA on CPAP   . Periodic heart flutter   . Peripheral edema    takes LAsix as needed  . Peripheral vascular disease ([place])    AAA as stated per pt / was  just discovered and pt states has not been referred to vascular MD   . Restless leg    takes Requip daily  . Rosacea   . Sleep apnea   . Urinary incontinence   . Urinary urgency   . Varicose veins   . Weakness    numbness and tingling.    Past Surgical History:  Procedure Laterality Date  . ABDOMINAL HYSTERECTOMY     with BSo  . CARDIAC CATHETERIZATION  2013   Normal  . CARPAL TUNNEL RELEASE Bilateral   . CATARACT EXTRACTION, BILATERAL    . CHOLECYSTECTOMY    . COLONOSCOPY    . DIAGNOSTIC LAPAROSCOPY     multiple times  . DILATION AND CURETTAGE OF UTERUS    . ESOPHAGOGASTRODUODENOSCOPY (EGD) WITH PROPOFOL N/A 11/01/2014   Procedure: ESOPHAGOGASTRODUODENOSCOPY (EGD) WITH PROPOFOL;  Surgeon: Hulen Luster, MD;  Location: [facility] ENDOSCOPY;  Service: Gastroenterology;  Laterality: N/A;  . IMPLANTABLE CONTACT LENS IMPLANTATION     bilateral  . LAMINECTOMY  11/13/2015  . LUMBAR WOUND DEBRIDEMENT N/A 12/02/2015   Procedure: WOUND Exploration;  Surgeon: Consuella Lose, MD;  Location: [place];  Service: Neurosurgery;  Laterality: N/A;  . PICC LINE PLACE PERIPHERAL ([place] HX)     right upper arm   . ROTATOR CUFF REPAIR Left   . SAVORY DILATION N/A 11/01/2014   Procedure: SAVORY DILATION;  Surgeon: Hulen Luster, MD;  Location: [facility] ENDOSCOPY;  Service: Gastroenterology;  Laterality: N/A;  . TONSILLECTOMY    . TRIGGER FINGER RELEASE Bilateral     Family History  Problem Relation Age of Onset  . Heart disease Mother   . Breast cancer Mother 38  . Heart disease Father   . Heart attack Sister   . Heart disease Sister   . Heart disease Brother       Social History   Social History  . Marital status: Married    Spouse name: N/A  . Number of children: N/A  . Years of education: N/A   Occupational History  . retired    Social History Main Topics  . Smoking status: Never Smoker  . Smokeless tobacco: Never Used  . Alcohol use No  . Drug use: No  . Sexual activity: Not Asked    Other Topics Concern  . None   Social History Narrative  . None    Allergies  Allergen Reactions  . Ceftin [Cefuroxime Axetil] Diarrhea    diarrhea  . Codeine Rash       . Penicillins Rash    Has patient had a PCN reaction causing immediate rash, facial/tongue/throat swelling, SOB or lightheadedness with  hypotension: YES Has patient had a PCN reaction causing severe rash involving mucus membranes or skin necrosis: NO Has patient had a PCN retioion that required hospitalization NO Has patient had a PCN reaction occurring within the last 10 years: YES If all of the above answers are "NO", then may proceed with Cephalosporin use.  . Vicodin [Hydrocodone-Acetaminophen] Rash     Also passes out     Current Outpatient Prescriptions:  .  acidophilus (RISAQUAD) CAPS capsule, Take 1 capsule by mouth daily., Disp: 30 capsule, Rfl: 0 .  albuterol (PROVENTIL HFA;VENTOLIN HFA) 108 (90 Base) MCG/ACT inhaler, Inhale 2 puffs into the lungs every 6 (six) hours as needed for wheezing or shortness of breath., Disp: 1 Inhaler, Rfl: 0 .  Alirocumab (PRALUENT) 150 MG/ML SOPN, Inject 1 pen into the skin every 14 (fourteen) days., Disp: 6 pen, Rfl: 12 .  aspirin 81 MG tablet, Take 81 mg by mouth daily.  , Disp: , Rfl:  .  desonide (DESOWEN) 0.05 % cream, Apply 1 application topically 2 (two) times daily., Disp: , Rfl:  .  esomeprazole (NEXIUM) 40 MG capsule, Take 40 mg by mouth 2 (two) times daily before a meal. , Disp: , Rfl:  .  gabapentin (NEURONTIN) 300 MG capsule, Take 300 mg by mouth 3 (three) times daily. , Disp: , Rfl:  .  hyoscyamine (LEVSIN SL) 0.125 MG SL tablet, Place 2 tablets (0.25 mg total) under the tongue every 4 (four) hours as needed for cramping., Disp: 30 tablet, Rfl: 0 .  insulin aspart (NOVOLOG) 100 UNIT/ML FlexPen, Inject 10 Units into the skin See admin instructions. 10 units before breakfast, 10 units before lunch, 16 units at dinner, >200 increase by 2 units per sliding  scale, Disp: , Rfl:  .  insulin glargine (LANTUS) 100 UNIT/ML injection, Inject 30 Units into the skin daily. Take every morning , Disp: , Rfl:  .  levothyroxine (SYNTHROID, LEVOTHROID) 75 MCG tablet, Take 75 mcg by mouth daily before breakfast. , Disp: , Rfl:  .  lisinopril (PRINIVIL,ZESTRIL) 5 MG tablet, Take 1 tablet (5 mg total) by mouth daily., Disp: , Rfl:  .  metoprolol succinate (TOPROL-XL) 50 MG 24 hr tablet, Take 25 mg by mouth daily. Take with or immediately following a meal. , Disp: , Rfl:  .  Multiple Vitamin (MULTIVITAMIN) tablet, Take 1 tablet by mouth daily., Disp: , Rfl:  .  nitroGLYCERIN (NITROSTAT) 0.4 MG SL tablet, Place 1 tablet (0.4 mg total) under the tongue every 5 (five) minutes as needed for chest pain., Disp: 25 tablet, Rfl: 3 .  ondansetron (ZOFRAN) 4 MG tablet, Take 1 tablet (4 mg total) by mouth every 6 (six) hours as needed for nausea., Disp: 20 tablet, Rfl: 0 .  potassium chloride SA (K-DUR,KLOR-CON) 20 MEQ tablet, Take 2 tablets (40 mEq total) by mouth daily., Disp: 60 tablet, Rfl: 3 .  ropinirole (REQUIP) 5 MG tablet, Take 5 mg by mouth 2 (two) times daily., Disp: , Rfl:  .  temazepam (RESTORIL) 30 MG capsule, Take 30 mg by mouth at bedtime., Disp: , Rfl:  .  torsemide (DEMADEX) 20 MG tablet, Take 2 tablets (40 mg total) by mouth daily., Disp: 60 tablet, Rfl: 5 .  traMADol (ULTRAM) 50 MG tablet, Take 50 mg by mouth 3 (three) times daily as needed for moderate pain., Disp: , Rfl:       Review of Systems  Constitutional: Negative for chills and fever.  HENT: Negative for congestion and sore throat.  Eyes: Negative for photophobia.  Respiratory: Positive for shortness of breath. Negative for cough and wheezing.   Cardiovascular: Positive for chest pain. Negative for palpitations and leg swelling.  Gastrointestinal: Negative for abdominal pain, blood in stool, constipation, diarrhea, nausea and vomiting.  Genitourinary: Negative for dysuria, flank pain and  hematuria.  Musculoskeletal: Negative for myalgias.  Skin: Positive for wound. Negative for rash.  Neurological: Negative for dizziness and weakness.  Hematological: Does not bruise/bleed easily.  Psychiatric/Behavioral: Negative for suicidal ideas.       Objective:   Physical Exam  Constitutional: She is oriented to person, place, and time. She appears well-developed and well-nourished. No distress.  HENT:  Head: Normocephalic and atraumatic.  Mouth/Throat: No oropharyngeal exudate.  Eyes: Conjunctivae and EOM are normal. No scleral icterus.  Neck: Normal range of motion. Neck supple.  Cardiovascular: Normal rate and regular rhythm.   Pulmonary/Chest: Effort normal. No respiratory distress. She has no wheezes.  Abdominal: She exhibits no distension.  Musculoskeletal: She exhibits no edema or tenderness.  Neurological: She is alert and oriented to person, place, and time. She exhibits normal muscle tone. Coordination normal.  Skin: Skin is warm and dry. No rash noted. She is not diaphoretic. No erythema. No pallor.  Psychiatric: She has a normal mood and affect. Her behavior is normal. Judgment and thought content normal.     Postop wound 02/16/16        Assessment & Plan:   Post operative infection that was treated with debridement of soft tissue but with concern for deep infection such as discitis, potential osteomyelitis with hardware involvement:  MRI has not Shown this but her inflammatory markers were fairly high originally did come down some since then. My concern at present had been that she might have had  CSF leak. Indeed she improved on high dose CTX. Fortunately MRI done 10 days ago is very encouraring  I will recheck ESR and CRP again and she can followup PRN  Atypical chest pain but is relieved by nitro. This occurred a few days ago. I will cc her Cardiologist.  I spent greater than 25 minutes with the patient including greater than 50% of time in face to face  counsel of the patient re her back infection, her recent chest pain, and hospitalizations  and in coordination of her care.

## 2016-08-13 NOTE — Telephone Encounter (Signed)
Spoke w/ pt.  She reports that she was recently hospitalized. She saw Otila Kluver in the HF clinic on Friday, 6/29 and states that she just did not feel right & had a funny feeling in her chest. Her torsemide was increased to 40 mg BID, as she had gained 8 lbs in a week. She states that she "only gained 6 lbs" last week and has been stable for the past 2-3 days. She had some Ranexa 500 mg left over from where she previously took and has tried to restart this. She has been cutting in 1/2 and taking 250 mg BID w/ good results. She was previously taking nitro for anginal sx, but her sister advised she retry the Ranexa.  She would like to know if Dr. Rockey Situ is agreeable to her taking this, and if so, can we send in rx. She has appt on 7/23, but would like to know if she should continue on torsemide or if she needs something stronger to keep the fluid off.  She is limiting her fluids and sodium as directed by the HF clinic, she is frustrated that her wt keeps climbing. Advised her that I will make Dr. Rockey Situ aware of her concerns and call her back w/ his recommendation.

## 2016-08-14 LAB — C-REACTIVE PROTEIN: CRP: 4.4 mg/L (ref <8.0)

## 2016-08-14 LAB — SEDIMENTATION RATE: Sed Rate: 17 mm/hr (ref 0–30)

## 2016-08-16 NOTE — Telephone Encounter (Signed)
Okay to restart Ranexa if she feels this is helping  Higher dose torsemide can be useful to keep the weight down Would adjust the torsemide based on her weight/fluid weight  Would continue to restrict her fluids and sodium Would recommend follow-up in our office with one of [place]

## 2016-08-17 MED ORDER — RANOLAZINE ER 500 MG PO TB12: 500.0000 mg | ORAL_TABLET | Freq: Two times a day (BID) | ORAL | 3 refills | Status: DC

## 2016-08-17 NOTE — Telephone Encounter (Signed)
Spoke w/ pt.  Advised her of Dr. Donivan Scull recommendation. She is agreeable to plan and appreciative of the call.  She will keep upcoming appt on 09/03/16.

## 2016-08-19 NOTE — Progress Notes (Signed)
Can we call and see if she would like a stress test for angina We received a message from Dr. Tommy Medal that she has been using more NTG thx TG

## 2016-08-23 ENCOUNTER — Emergency Department: Payer: Medicare Other

## 2016-08-23 ENCOUNTER — Encounter: Payer: Self-pay | Admitting: Emergency Medicine

## 2016-08-23 ENCOUNTER — Emergency Department
Admission: EM | Admit: 2016-08-23 | Discharge: 2016-08-23 | Disposition: A | Discharge status: Home / Self Care | Payer: Medicare Other | Attending: Emergency Medicine | Admitting: Emergency Medicine

## 2016-08-23 DIAGNOSIS — I110 Hypertensive heart disease with heart failure: Secondary | ICD-10-CM | POA: Diagnosis not present

## 2016-08-23 DIAGNOSIS — Z7982 Long term (current) use of aspirin: Secondary | ICD-10-CM | POA: Diagnosis not present

## 2016-08-23 DIAGNOSIS — Z794 Long term (current) use of insulin: Secondary | ICD-10-CM | POA: Insufficient documentation

## 2016-08-23 DIAGNOSIS — Y939 Activity, unspecified: Secondary | ICD-10-CM | POA: Diagnosis not present

## 2016-08-23 DIAGNOSIS — S52572A Other intraarticular fracture of lower end of left radius, initial encounter for closed fracture: Secondary | ICD-10-CM | POA: Insufficient documentation

## 2016-08-23 DIAGNOSIS — S59912A Unspecified injury of left forearm, initial encounter: Secondary | ICD-10-CM | POA: Diagnosis present

## 2016-08-23 DIAGNOSIS — E039 Hypothyroidism, unspecified: Secondary | ICD-10-CM | POA: Insufficient documentation

## 2016-08-23 DIAGNOSIS — I5032 Chronic diastolic (congestive) heart failure: Secondary | ICD-10-CM | POA: Insufficient documentation

## 2016-08-23 DIAGNOSIS — Y999 Unspecified external cause status: Secondary | ICD-10-CM | POA: Insufficient documentation

## 2016-08-23 DIAGNOSIS — W19XXXA Unspecified fall, initial encounter: Secondary | ICD-10-CM | POA: Insufficient documentation

## 2016-08-23 DIAGNOSIS — E119 Type 2 diabetes mellitus without complications: Secondary | ICD-10-CM | POA: Diagnosis not present

## 2016-08-23 DIAGNOSIS — Y92015 Private garage of single-family (private) house as the place of occurrence of the external cause: Secondary | ICD-10-CM | POA: Diagnosis not present

## 2016-08-23 MED ORDER — FENTANYL CITRATE (PF) 100 MCG/2ML IJ SOLN
50.0000 ug | Freq: Once | INTRAMUSCULAR | Status: AC
  Administered 2016-08-23: 50 ug via INTRAVENOUS
  Filled 2016-08-23: qty 2

## 2016-08-23 MED ORDER — TRAMADOL HCL 50 MG PO TABS
50.0000 mg | ORAL_TABLET | Freq: Once | ORAL | Status: AC
  Administered 2016-08-23: 50 mg via ORAL
  Filled 2016-08-23: qty 1

## 2016-08-23 NOTE — ED Triage Notes (Signed)
Brought in via ems s/p fall  States she tripped landed on left arm  Pt has left arm in splint from EMS  Positive pulses  Good sensation

## 2016-08-23 NOTE — ED Provider Notes (Signed)
[facility] Emergency Department Provider Note  ____________________________________________   First MD Initiated Contact with Patient 08/23/16 1133     (approximate)  I have reviewed the triage vital signs and the nursing notes.   HISTORY  Chief Complaint Fall   HPI [name] is a 71 y.o. female is brought in via EMS after she fell in her garage. Patient states she landed on her left arm and had immediate pain. She denies any head injury or loss of consciousness.  Patient was given fentanyl IV by EMS for pain prior to arrival in the emergency room. She denies any other injuries. Patient is right-hand dominant.  She states prior to that her pain was a 10 over 10.  Past Medical History:  Diagnosis Date  . Anxiety   . Arthritis   . Chronic back pain    stenosis.degenerative disc,some scoliosis  . Constipation    takes Stool Softener daily  . Coronary artery disease   . Depression    takes Cymbalta daily  . Diabetes mellitus    Type 2 diabetic. Average fasting blood sugar runs high 170-200  . Diverticulosis   . E coli infection   . GERD (gastroesophageal reflux disease)    takes Nexium daily  . Headache   . Headache 02/16/2016  . Hemorrhoids   . History of colon polyps    benign  . History of gout    doesn't take any meds  . History of hiatal hernia   . History of vertigo    doesn't take any meds  . Hyperlipidemia    takes Praluent daily  . Hypertension    currently BP medications are on hold   . Hypothyroidism    takes Synthroid daily  . Insomnia    takes Restoril nightly  . Joint pain   . Joint swelling   . Muscle spasm    takes Robaxin as needed  . NSVT (nonsustained ventricular tachycardia) ([place])   . OSA on CPAP   . Periodic heart flutter   . Peripheral edema    takes LAsix as needed  . Peripheral vascular disease ([place])    AAA as stated per pt / was just discovered and pt states has not been referred to vascular MD   . Restless leg    takes Requip daily  . Rosacea   . Sleep apnea   . Urinary incontinence   . Urinary urgency   . Varicose veins   . Weakness    numbness and tingling.    Patient Active Problem List   Diagnosis Date Noted  . Chronic diastolic heart failure ([place]) 08/10/2016  . Acute respiratory distress 07/25/2016  . Hypertensive heart disease 07/25/2016  . AKI (acute kidney injury) ([place]) 07/25/2016  . Morbid obesity ([place]) 07/25/2016  . Physical deconditioning 07/25/2016  . Viral gastroenteritis 06/25/2016  . Pyuria 06/25/2016  . Acute renal insufficiency 06/25/2016  . Abdominal pain 06/24/2016  . Headache 02/16/2016  . E coli infection   . Abscess of back   . Sepsis due to Escherichia coli (E. coli) ([place])   . 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 Wound infection after surgery 12/02/2015  . Lumbar degenerative disc disease 11/14/2015  . Itching 02/24/2014  . Anxiety 02/24/2014  . Fatigue 10/23/2013  . Atypical chest pain 06/09/2012  . CAD (coronary artery disease) 06/09/2012  . Hyperlipidemia 08/09/2011  . Arm pain 06/28/2011  . Tachycardia 05/05/2010  . Diabetes mellitus ([place]) 05/05/2010  . OSA on CPAP 05/05/2010  . HTN (hypertension) 05/05/2010  . Edema 05/05/2010  . HYPERTRIGLYCERIDEMIA 06/08/2008  . DYSPNEA 06/08/2008    Past Surgical History:  Procedure Laterality Date  . ABDOMINAL HYSTERECTOMY     with BSo  . CARDIAC CATHETERIZATION  2013   Normal  . CARPAL TUNNEL RELEASE  Bilateral   . CATARACT EXTRACTION, BILATERAL    . CHOLECYSTECTOMY    . COLONOSCOPY    . DIAGNOSTIC LAPAROSCOPY     multiple times  . DILATION AND CURETTAGE OF UTERUS    . ESOPHAGOGASTRODUODENOSCOPY (EGD) WITH PROPOFOL N/A 11/01/2014   Procedure: ESOPHAGOGASTRODUODENOSCOPY (EGD) WITH PROPOFOL;  Surgeon: Hulen Luster, MD;  Location: [facility] ENDOSCOPY;  Service: Gastroenterology;  Laterality: N/A;  . IMPLANTABLE CONTACT LENS  IMPLANTATION     bilateral  . LAMINECTOMY  11/13/2015  . LUMBAR WOUND DEBRIDEMENT N/A 12/02/2015   Procedure: WOUND Exploration;  Surgeon: Consuella Lose, MD;  Location: [place];  Service: Neurosurgery;  Laterality: N/A;  . PICC LINE PLACE PERIPHERAL ([place] HX)     right upper arm   . ROTATOR CUFF REPAIR Left   . SAVORY DILATION N/A 11/01/2014   Procedure: SAVORY DILATION;  Surgeon: Hulen Luster, MD;  Location: [facility] ENDOSCOPY;  Service: Gastroenterology;  Laterality: N/A;  . TONSILLECTOMY    . TRIGGER FINGER RELEASE Bilateral     Prior to Admission medications   Medication Sig Start Date End Date Taking? Authorizing Provider  acidophilus (RISAQUAD) CAPS capsule Take 1 capsule by mouth daily. 06/25/16   Theodoro Grist, MD  albuterol (PROVENTIL HFA;VENTOLIN HFA) 108 (90 Base) MCG/ACT inhaler Inhale 2 puffs into the lungs every 6 (six) hours as needed for wheezing or shortness of breath. 07/28/16   Sudini, Alveta Heimlich, MD  Alirocumab (PRALUENT) 150 MG/ML SOPN Inject 1 pen into the skin every 14 (fourteen) days. 01/12/16   Minna Merritts, MD  aspirin 81 MG tablet Take 81 mg by mouth daily.      [provider]  desonide (DESOWEN) 0.05 % cream Apply 1 application topically 2 (two) times daily.    [provider]  esomeprazole (NEXIUM) 40 MG capsule Take 40 mg by mouth 2 (two) times daily before a meal.     [provider]  gabapentin (NEURONTIN) 300 MG capsule Take 300 mg by mouth 3 (three) times daily.     [provider]  hyoscyamine (LEVSIN SL)  0.125 MG SL tablet Place 2 tablets (0.25 mg total) under the tongue every 4 (four) hours as needed for cramping. 06/25/16   Theodoro Grist, MD  insulin aspart (NOVOLOG) 100 UNIT/ML FlexPen Inject 10 Units into the skin See admin instructions. 10 units before breakfast, 10 units before lunch, 16 units at dinner, >200 increase by 2 units per sliding scale 03/01/15 07/23/17  [provider]  insulin glargine (LANTUS) 100 UNIT/ML injection Inject 30 Units into the skin daily. Take every morning     [provider]  levothyroxine (SYNTHROID, LEVOTHROID) 75 MCG tablet Take 75 mcg by mouth daily before breakfast.  11/24/12   [provider]  lisinopril (PRINIVIL,ZESTRIL) 5 MG tablet Take 1 tablet (5 mg total) by mouth daily. 07/28/16   Hillary Bow, MD  metoprolol succinate (TOPROL-XL) 50 MG 24 hr tablet Take 25 mg by mouth daily. Take with or immediately following a meal.     [provider]  Multiple Vitamin (MULTIVITAMIN) tablet Take 1 tablet by mouth daily.    [provider]  nitroGLYCERIN (NITROSTAT) 0.4 MG SL tablet Place 1 tablet (0.4 mg total) under the tongue every 5 (five) minutes as needed for chest pain. 03/06/16 09/18/17  Minna Merritts, MD  ondansetron (ZOFRAN) 4 MG tablet Take 1 tablet (4 mg total) by mouth every 6 (six) hours as needed for nausea. Patient not taking: Reported on 08/13/2016 06/25/16   Theodoro Grist, MD  potassium chloride SA (K-DUR,KLOR-CON) 20 MEQ tablet Take 2 tablets (40 mEq total) by mouth daily. 08/10/16 09/09/16  Alisa Graff, FNP  ranolazine (RANEXA) 500 MG 12 hr tablet Take 1 tablet (500 mg total) by mouth 2 (two) times daily. 08/17/16   Minna Merritts, MD  ropinirole (REQUIP) 5 MG tablet Take 5 mg by mouth 2 (two) times daily.  [provider]  temazepam (RESTORIL) 30 MG capsule Take 30 mg by mouth at bedtime.    [provider]  torsemide (DEMADEX) 20 MG tablet Take 2 tablets (40 mg total) by mouth daily.  08/10/16   Alisa Graff, FNP  traMADol (ULTRAM) 50 MG tablet Take 50 mg by mouth 3 (three) times daily as needed for moderate pain.    [provider]    Allergies Ceftin [cefuroxime axetil]; Codeine; Penicillins; and Vicodin [hydrocodone-acetaminophen]  Family History  Problem Relation Age of Onset  . Heart disease Mother   . Breast cancer Mother 52  . Heart disease Father   . Heart attack Sister   . Heart disease Sister   . Heart disease Brother     Social History Social History  Substance Use Topics  . Smoking status: Never Smoker  . Smokeless tobacco: Never Used  . Alcohol use No    Review of Systems Constitutional: No fever/chills Eyes: No visual changes. ENT: No trauma Cardiovascular: Denies chest pain. Respiratory: Denies shortness of breath. Gastrointestinal: No abdominal pain.  No nausea, no vomiting.  Musculoskeletal: Positive left wrist pain. Positive left forearm pain. Skin: Negative for rash. Neurological: Negative for headaches, focal weakness or numbness.   ____________________________________________   PHYSICAL EXAM:  VITAL SIGNS: ED Triage Vitals  Enc Vitals Group     BP      Pulse      Resp      Temp      Temp src      SpO2      Weight      Height      Head Circumference      Peak Flow      Pain Score      Pain Loc      Pain Edu?      Excl. in [place]?     Constitutional: Alert and oriented. Well appearing and in no acute distress. Eyes: Conjunctivae are normal. PERRL. EOMI. Head: Atraumatic. Nose: No trauma Neck: No stridor.  Nontender cervical spine to palpation posteriorly. Cardiovascular: Normal rate, regular rhythm. Grossly normal heart sounds.  Good peripheral circulation. Respiratory: Normal respiratory effort.  No retractions. Lungs CTAB. Musculoskeletal: Splint was removed and on exam there is moderate soft tissue swelling at the left wrist. There is also moderate tenderness on palpation of the distal radius and  ulna. Nontender to palpation proximal forearm and elbow. There is no tenderness on palpation of the shoulder. Capillary refill is less than 3 seconds. Skin is intact. Patient is unable to move without extreme pain. Neurologic:  Normal speech and language. No gross focal neurologic deficits are appreciated.  Skin:  Skin is warm, dry and intact. No rash noted. Psychiatric: Mood and affect are normal. Speech and behavior are normal.  ____________________________________________   LABS (all labs ordered are listed, but only abnormal results are displayed)  Labs Reviewed - No data to display  RADIOLOGY  Dg Forearm Left  Result Date: 08/23/2016 CLINICAL DATA:  Fall, pain EXAM: LEFT FOREARM - 2 VIEW COMPARISON:  None. FINDINGS: Comminuted, intra-articular in displaced distal left radial fracture. Mild impaction. No ulnar abnormality. IMPRESSION: Impacted, comminuted, displaced intra-articular distal left radial fracture. Electronically Signed   By: Rolm Baptise M.D.   On: 08/23/2016 12:29   Dg Hand Complete Left  Result Date: 08/23/2016 CLINICAL DATA:  [place].  Pain. EXAM: LEFT HAND - COMPLETE 3+ VIEW COMPARISON:  Forearm reported separately. FINDINGS: Incompletely visualized is a comminuted, probable intra-articular,  distal radius fracture, with dorsal angulation and displacement. Carpal bones are displaced posteriorly along with a central fragment. No definite carpal bone fracture is seen. Metacarpals and phalanges appear intact. Soft tissue swelling. No ulnar abnormality. IMPRESSION: Comminuted, probably intra-articular distal radius fracture with dorsal displacement of the carpal bones along with a central fragment, as described above. Electronically Signed   By: Staci Righter M.D.   On: 08/23/2016 13:12    ____________________________________________   PROCEDURES  Procedure(s) performed: None  Procedures  Critical Care performed:  No  ____________________________________________   INITIAL IMPRESSION / ASSESSMENT AND PLAN / ED COURSE  Pertinent labs & imaging results that were available during my care of the patient were reviewed by me and considered in my medical decision making (see chart for details).  Patient received fentanyl while in the emergency room for pain. She states that she has taken tramadol in the past without any allergic reactions. After talking to Dr. Harlow Mares who is the orthopedist on call patient,  was instructed to call the office tomorrow for an appointment time to be seen tomorrow. She is to ice and elevate wrist to reduce swelling.    ___________________________________________   FINAL CLINICAL IMPRESSION(S) / ED DIAGNOSES  Final diagnoses:  Other closed intra-articular fracture of distal end of left radius, initial encounter      NEW MEDICATIONS STARTED DURING THIS VISIT:  Discharge Medication List as of 08/23/2016  2:35 PM       Note:  This document was prepared using Dragon voice recognition software and may include unintentional dictation errors.    Johnn Hai, PA-C 08/23/16 1558    Darel Hong, MD 08/27/16 1104

## 2016-08-23 NOTE — Discharge Instructions (Signed)
Ice and elevation to reduce swelling. Wear sling if needed for support. Call Dr. Harlow Mares office for an appointment tomorrow. Take tramadol as needed for pain. This medication could cause drowsiness increase your risk for falling.

## 2016-08-27 ENCOUNTER — Ambulatory Visit: Payer: Self-pay | Admitting: Orthopedic Surgery

## 2016-08-28 ENCOUNTER — Telehealth: Payer: Self-pay | Admitting: Cardiovascular Disease

## 2016-08-28 ENCOUNTER — Encounter
Admission: RE | Admit: 2016-08-28 | Discharge: 2016-08-28 | Disposition: A | Discharge status: Home / Self Care | Payer: Medicare Other | Source: Ambulatory Visit | Attending: Orthopedic Surgery | Admitting: Orthopedic Surgery

## 2016-08-28 DIAGNOSIS — I714 Abdominal aortic aneurysm, without rupture: Secondary | ICD-10-CM | POA: Diagnosis not present

## 2016-08-28 DIAGNOSIS — F329 Major depressive disorder, single episode, unspecified: Secondary | ICD-10-CM | POA: Diagnosis not present

## 2016-08-28 DIAGNOSIS — S52572A Other intraarticular fracture of lower end of left radius, initial encounter for closed fracture: Secondary | ICD-10-CM | POA: Diagnosis present

## 2016-08-28 DIAGNOSIS — I25119 Atherosclerotic heart disease of native coronary artery with unspecified angina pectoris: Secondary | ICD-10-CM | POA: Diagnosis not present

## 2016-08-28 DIAGNOSIS — I10 Essential (primary) hypertension: Secondary | ICD-10-CM | POA: Diagnosis not present

## 2016-08-28 DIAGNOSIS — Z791 Long term (current) use of non-steroidal anti-inflammatories (NSAID): Secondary | ICD-10-CM | POA: Diagnosis not present

## 2016-08-28 DIAGNOSIS — M62838 Other muscle spasm: Secondary | ICD-10-CM | POA: Diagnosis not present

## 2016-08-28 DIAGNOSIS — E039 Hypothyroidism, unspecified: Secondary | ICD-10-CM | POA: Diagnosis not present

## 2016-08-28 DIAGNOSIS — G4733 Obstructive sleep apnea (adult) (pediatric): Secondary | ICD-10-CM | POA: Diagnosis not present

## 2016-08-28 DIAGNOSIS — K5900 Constipation, unspecified: Secondary | ICD-10-CM | POA: Diagnosis not present

## 2016-08-28 DIAGNOSIS — Z86010 Personal history of colonic polyps: Secondary | ICD-10-CM | POA: Diagnosis not present

## 2016-08-28 DIAGNOSIS — W19XXXA Unspecified fall, initial encounter: Secondary | ICD-10-CM | POA: Diagnosis not present

## 2016-08-28 DIAGNOSIS — G8929 Other chronic pain: Secondary | ICD-10-CM | POA: Diagnosis not present

## 2016-08-28 DIAGNOSIS — G2581 Restless legs syndrome: Secondary | ICD-10-CM | POA: Diagnosis not present

## 2016-08-28 DIAGNOSIS — Z794 Long term (current) use of insulin: Secondary | ICD-10-CM | POA: Diagnosis not present

## 2016-08-28 DIAGNOSIS — M549 Dorsalgia, unspecified: Secondary | ICD-10-CM | POA: Diagnosis not present

## 2016-08-28 DIAGNOSIS — G4700 Insomnia, unspecified: Secondary | ICD-10-CM | POA: Diagnosis not present

## 2016-08-28 DIAGNOSIS — E1151 Type 2 diabetes mellitus with diabetic peripheral angiopathy without gangrene: Secondary | ICD-10-CM | POA: Diagnosis not present

## 2016-08-28 DIAGNOSIS — Z8619 Personal history of other infectious and parasitic diseases: Secondary | ICD-10-CM | POA: Diagnosis not present

## 2016-08-28 DIAGNOSIS — I472 Ventricular tachycardia: Secondary | ICD-10-CM | POA: Diagnosis not present

## 2016-08-28 DIAGNOSIS — K219 Gastro-esophageal reflux disease without esophagitis: Secondary | ICD-10-CM | POA: Diagnosis not present

## 2016-08-28 DIAGNOSIS — Z79899 Other long term (current) drug therapy: Secondary | ICD-10-CM | POA: Diagnosis not present

## 2016-08-28 DIAGNOSIS — E785 Hyperlipidemia, unspecified: Secondary | ICD-10-CM | POA: Diagnosis not present

## 2016-08-28 HISTORY — DX: Angina pectoris, unspecified: I20.9

## 2016-08-28 NOTE — Telephone Encounter (Signed)
[facility] calling to find clearance

## 2016-08-28 NOTE — Patient Instructions (Addendum)
Your procedure is scheduled on: Wednesday 08/29/16 Report to [facility]. 2ND FLOOR MEDICAL MALL ENTRANCE. To find out your arrival time please call [phone] between 1PM - 3PM on Today.  Remember: Instructions that are not followed completely may result in serious medical risk, up to and including death, or upon the discretion of your surgeon and anesthesiologist your surgery may need to be rescheduled.    __X__ 1. Do not eat food or drink liquids after midnight. No gum chewing or hard candies.     __X__ 2. No Alcohol for 24 hours before or after surgery.   ____ 3. Bring all medications with you on the day of surgery if instructed.    __X__ 4. Notify your doctor if there is any change in your medical condition     (cold, fever, infections).             __X___5. No smoking within 24 hours of your surgery.     Do not wear jewelry, make-up, hairpins, clips or nail polish.  Do not wear lotions, powders, or perfumes.   Do not shave 48 hours prior to surgery. Men may shave face and neck.  Do not bring valuables to the hospital.    [facility] is not responsible for any belongings or valuables.               Contacts, dentures or bridgework may not be worn into surgery.  Leave your suitcase in the car. After surgery it may be brought to your room.  For patients admitted to the hospital, discharge time is determined by your                treatment team.   Patients discharged the day of surgery will not be allowed to drive home.   Please read over the following fact sheets that you were given:   MRSA Information   __X__ Take these medicines the morning of surgery with A SIP OF WATER:    1. [place]  2. GABAPENTIN  3. LEVOTHYROXINE  4. REQUIP  5. MAY TAKE YOUR PAIN MEDICINE  6.  ____ Fleet Enema (as directed)   __X__ Use CHG Soap or SAGE wipes as directed  __X__ Use inhalers on the day of surgery  ____ Stop metformin 2 days prior to surgery    __X__ Take 1/2 of usual insulin  dose the night before surgery and none on the morning of surgery.   ___X_ Stop Coumadin/Plavix/aspirin on TODAY  __X__ Stop Anti-inflammatories such as Advil, Aleve, Ibuprofen, Motrin, Naproxen, Naprosyn, Goodies,powder, or aspirin products.  OK to take Tylenol.   ____ Stop supplements until after surgery.    __X__ Bring C-Pap to the hospital.

## 2016-08-28 NOTE — Telephone Encounter (Signed)
Received cardiac clearance request for pt to proceed w/ ORIF left wrist on 08/29/16 w/ Dr. Kurtis Bushman w/ regional anesthesia. Please route clearance to [facility] @ [phone].

## 2016-08-28 NOTE — Telephone Encounter (Signed)
Routed to fax # provided. 

## 2016-08-28 NOTE — Telephone Encounter (Signed)
Should be okay to clear for surgery tomorrow

## 2016-08-28 NOTE — Telephone Encounter (Signed)
Left message for pt to call back & let us know if she has been having any more sx of angina.  Ranexa had improved her sx during our last conversation, wanted to make sure that she is still chest pain-free.

## 2016-08-29 ENCOUNTER — Encounter: Payer: Self-pay | Admitting: *Deleted

## 2016-08-29 ENCOUNTER — Encounter
Admission: RE | Disposition: A | Discharge status: Home / Self Care | Payer: Self-pay | Source: Ambulatory Visit | Attending: Orthopedic Surgery

## 2016-08-29 ENCOUNTER — Ambulatory Visit
Admission: RE | Admit: 2016-08-29 | Discharge: 2016-08-29 | Disposition: A | Discharge status: Home / Self Care | Payer: Medicare Other | Source: Ambulatory Visit | Attending: Orthopedic Surgery | Admitting: Orthopedic Surgery

## 2016-08-29 ENCOUNTER — Ambulatory Visit: Payer: Medicare Other | Admitting: Anesthesiology

## 2016-08-29 DIAGNOSIS — G2581 Restless legs syndrome: Secondary | ICD-10-CM | POA: Insufficient documentation

## 2016-08-29 DIAGNOSIS — I25119 Atherosclerotic heart disease of native coronary artery with unspecified angina pectoris: Secondary | ICD-10-CM | POA: Insufficient documentation

## 2016-08-29 DIAGNOSIS — Z8619 Personal history of other infectious and parasitic diseases: Secondary | ICD-10-CM | POA: Insufficient documentation

## 2016-08-29 DIAGNOSIS — M549 Dorsalgia, unspecified: Secondary | ICD-10-CM | POA: Insufficient documentation

## 2016-08-29 DIAGNOSIS — E785 Hyperlipidemia, unspecified: Secondary | ICD-10-CM | POA: Insufficient documentation

## 2016-08-29 DIAGNOSIS — W19XXXA Unspecified fall, initial encounter: Secondary | ICD-10-CM | POA: Insufficient documentation

## 2016-08-29 DIAGNOSIS — Z791 Long term (current) use of non-steroidal anti-inflammatories (NSAID): Secondary | ICD-10-CM | POA: Insufficient documentation

## 2016-08-29 DIAGNOSIS — Z86010 Personal history of colonic polyps: Secondary | ICD-10-CM | POA: Insufficient documentation

## 2016-08-29 DIAGNOSIS — I714 Abdominal aortic aneurysm, without rupture: Secondary | ICD-10-CM | POA: Insufficient documentation

## 2016-08-29 DIAGNOSIS — I10 Essential (primary) hypertension: Secondary | ICD-10-CM | POA: Insufficient documentation

## 2016-08-29 DIAGNOSIS — I472 Ventricular tachycardia: Secondary | ICD-10-CM | POA: Insufficient documentation

## 2016-08-29 DIAGNOSIS — Z79899 Other long term (current) drug therapy: Secondary | ICD-10-CM | POA: Insufficient documentation

## 2016-08-29 DIAGNOSIS — F329 Major depressive disorder, single episode, unspecified: Secondary | ICD-10-CM | POA: Insufficient documentation

## 2016-08-29 DIAGNOSIS — K219 Gastro-esophageal reflux disease without esophagitis: Secondary | ICD-10-CM | POA: Insufficient documentation

## 2016-08-29 DIAGNOSIS — Z794 Long term (current) use of insulin: Secondary | ICD-10-CM | POA: Insufficient documentation

## 2016-08-29 DIAGNOSIS — G8929 Other chronic pain: Secondary | ICD-10-CM | POA: Insufficient documentation

## 2016-08-29 DIAGNOSIS — G4733 Obstructive sleep apnea (adult) (pediatric): Secondary | ICD-10-CM | POA: Insufficient documentation

## 2016-08-29 DIAGNOSIS — G4700 Insomnia, unspecified: Secondary | ICD-10-CM | POA: Insufficient documentation

## 2016-08-29 DIAGNOSIS — K5900 Constipation, unspecified: Secondary | ICD-10-CM | POA: Insufficient documentation

## 2016-08-29 DIAGNOSIS — E039 Hypothyroidism, unspecified: Secondary | ICD-10-CM | POA: Insufficient documentation

## 2016-08-29 DIAGNOSIS — M62838 Other muscle spasm: Secondary | ICD-10-CM | POA: Insufficient documentation

## 2016-08-29 DIAGNOSIS — S52572A Other intraarticular fracture of lower end of left radius, initial encounter for closed fracture: Secondary | ICD-10-CM | POA: Insufficient documentation

## 2016-08-29 DIAGNOSIS — E1151 Type 2 diabetes mellitus with diabetic peripheral angiopathy without gangrene: Secondary | ICD-10-CM | POA: Insufficient documentation

## 2016-08-29 HISTORY — PX: OPEN REDUCTION INTERNAL FIXATION (ORIF) DISTAL RADIAL FRACTURE: SHX5989

## 2016-08-29 LAB — GLUCOSE, CAPILLARY
GLUCOSE-CAPILLARY: 133 mg/dL — AB (ref 65–99)
GLUCOSE-CAPILLARY: 125 mg/dL — AB (ref 65–99)

## 2016-08-29 SURGERY — OPEN REDUCTION INTERNAL FIXATION (ORIF) DISTAL RADIUS FRACTURE
Anesthesia: Regional | Laterality: Left

## 2016-08-29 MED ORDER — FENTANYL CITRATE (PF) 100 MCG/2ML IJ SOLN
INTRAMUSCULAR | Status: AC
  Administered 2016-08-29: 50 ug via INTRAVENOUS
  Filled 2016-08-29: qty 2

## 2016-08-29 MED ORDER — ACETAMINOPHEN 500 MG PO TABS
1000.0000 mg | ORAL_TABLET | Freq: Once | ORAL | Status: AC
  Administered 2016-08-29: 1000 mg via ORAL

## 2016-08-29 MED ORDER — METOCLOPRAMIDE HCL 5 MG/ML IJ SOLN: 5.0000 mg | Freq: Three times a day (TID) | INTRAMUSCULAR | Status: DC | PRN

## 2016-08-29 MED ORDER — LIDOCAINE HCL (PF) 2 % IJ SOLN
INTRAMUSCULAR | Status: AC
  Filled 2016-08-29: qty 2

## 2016-08-29 MED ORDER — ROPIVACAINE HCL 5 MG/ML IJ SOLN
INTRAMUSCULAR | Status: AC
  Filled 2016-08-29: qty 30

## 2016-08-29 MED ORDER — ROCURONIUM BROMIDE 50 MG/5ML IV SOLN
INTRAVENOUS | Status: AC
  Filled 2016-08-29: qty 1

## 2016-08-29 MED ORDER — IPRATROPIUM-ALBUTEROL 0.5-2.5 (3) MG/3ML IN SOLN
RESPIRATORY_TRACT | Status: AC
  Administered 2016-08-29: 3 mL via RESPIRATORY_TRACT
  Filled 2016-08-29: qty 3

## 2016-08-29 MED ORDER — FENTANYL CITRATE (PF) 250 MCG/5ML IJ SOLN
INTRAMUSCULAR | Status: AC
  Filled 2016-08-29: qty 5

## 2016-08-29 MED ORDER — CLINDAMYCIN PHOSPHATE 900 MG/50ML IV SOLN
INTRAVENOUS | Status: AC
  Filled 2016-08-29: qty 50

## 2016-08-29 MED ORDER — EPHEDRINE SULFATE 50 MG/ML IJ SOLN
INTRAMUSCULAR | Status: DC | PRN
  Administered 2016-08-29: 5 mg via INTRAVENOUS
  Administered 2016-08-29: 15 mg via INTRAVENOUS

## 2016-08-29 MED ORDER — METOCLOPRAMIDE HCL 10 MG PO TABS: 5.0000 mg | ORAL_TABLET | Freq: Three times a day (TID) | ORAL | Status: DC | PRN

## 2016-08-29 MED ORDER — FENTANYL CITRATE (PF) 100 MCG/2ML IJ SOLN
50.0000 ug | Freq: Once | INTRAMUSCULAR | Status: AC
  Administered 2016-08-29: 50 ug via INTRAVENOUS

## 2016-08-29 MED ORDER — SUGAMMADEX SODIUM 200 MG/2ML IV SOLN
INTRAVENOUS | Status: DC | PRN
  Administered 2016-08-29: 200 mg via INTRAVENOUS

## 2016-08-29 MED ORDER — FENTANYL CITRATE (PF) 100 MCG/2ML IJ SOLN: 25.0000 ug | INTRAMUSCULAR | Status: DC | PRN

## 2016-08-29 MED ORDER — PROPOFOL 10 MG/ML IV BOLUS
INTRAVENOUS | Status: DC | PRN
Start: 2016-08-29 — End: 2016-08-29
  Administered 2016-08-29: 150 mg via INTRAVENOUS

## 2016-08-29 MED ORDER — ONDANSETRON HCL 4 MG/2ML IJ SOLN
INTRAMUSCULAR | Status: AC
  Filled 2016-08-29: qty 2

## 2016-08-29 MED ORDER — CHLORHEXIDINE GLUCONATE 4 % EX LIQD: 60.0000 mL | Freq: Once | CUTANEOUS | Status: DC

## 2016-08-29 MED ORDER — ONDANSETRON HCL 4 MG PO TABS: 4.0000 mg | ORAL_TABLET | Freq: Four times a day (QID) | ORAL | Status: DC | PRN

## 2016-08-29 MED ORDER — PROPOFOL 10 MG/ML IV BOLUS
INTRAVENOUS | Status: AC
  Filled 2016-08-29: qty 20

## 2016-08-29 MED ORDER — MIDAZOLAM HCL 2 MG/2ML IJ SOLN
1.0000 mg | Freq: Once | INTRAMUSCULAR | Status: AC
  Administered 2016-08-29: 1 mg via INTRAVENOUS

## 2016-08-29 MED ORDER — MIDAZOLAM HCL 2 MG/2ML IJ SOLN
INTRAMUSCULAR | Status: AC
  Administered 2016-08-29: 1 mg via INTRAVENOUS
  Filled 2016-08-29: qty 2

## 2016-08-29 MED ORDER — SODIUM CHLORIDE 0.9 % IR SOLN
Status: DC | PRN
  Administered 2016-08-29: 15:00:00

## 2016-08-29 MED ORDER — CLINDAMYCIN PHOSPHATE 900 MG/50ML IV SOLN
900.0000 mg | INTRAVENOUS | Status: AC
  Administered 2016-08-29: 900 mg via INTRAVENOUS

## 2016-08-29 MED ORDER — SUCCINYLCHOLINE CHLORIDE 20 MG/ML IJ SOLN
INTRAMUSCULAR | Status: DC | PRN
  Administered 2016-08-29: 100 mg via INTRAVENOUS

## 2016-08-29 MED ORDER — FENTANYL CITRATE (PF) 100 MCG/2ML IJ SOLN
INTRAMUSCULAR | Status: DC | PRN
  Administered 2016-08-29: 50 ug via INTRAVENOUS

## 2016-08-29 MED ORDER — IPRATROPIUM-ALBUTEROL 0.5-2.5 (3) MG/3ML IN SOLN
3.0000 mL | Freq: Once | RESPIRATORY_TRACT | Status: AC
  Administered 2016-08-29: 3 mL via RESPIRATORY_TRACT

## 2016-08-29 MED ORDER — LACTATED RINGERS IV SOLN: INTRAVENOUS | Status: DC

## 2016-08-29 MED ORDER — ROPIVACAINE HCL 5 MG/ML IJ SOLN
INTRAMUSCULAR | Status: DC | PRN
  Administered 2016-08-29: 30 mL via PERINEURAL

## 2016-08-29 MED ORDER — HYDROMORPHONE HCL 1 MG/ML IJ SOLN: 0.5000 mg | INTRAMUSCULAR | Status: DC | PRN

## 2016-08-29 MED ORDER — IPRATROPIUM-ALBUTEROL 0.5-2.5 (3) MG/3ML IN SOLN: 3.0000 mL | RESPIRATORY_TRACT | Status: DC

## 2016-08-29 MED ORDER — SODIUM CHLORIDE 0.9 % IV SOLN
INTRAVENOUS | Status: DC
  Administered 2016-08-29: 12:00:00 via INTRAVENOUS

## 2016-08-29 MED ORDER — LIDOCAINE HCL (PF) 1 % IJ SOLN
INTRAMUSCULAR | Status: DC | PRN
  Administered 2016-08-29: 5 mL via SUBCUTANEOUS

## 2016-08-29 MED ORDER — ONDANSETRON HCL 4 MG/2ML IJ SOLN: 4.0000 mg | Freq: Four times a day (QID) | INTRAMUSCULAR | Status: DC | PRN

## 2016-08-29 MED ORDER — FAMOTIDINE 20 MG PO TABS
20.0000 mg | ORAL_TABLET | Freq: Once | ORAL | Status: AC
Start: 2016-08-29 — End: 2016-08-29
  Administered 2016-08-29: 20 mg via ORAL

## 2016-08-29 MED ORDER — ROCURONIUM BROMIDE 100 MG/10ML IV SOLN
INTRAVENOUS | Status: DC | PRN
  Administered 2016-08-29: 10 mg via INTRAVENOUS
  Administered 2016-08-29: 20 mg via INTRAVENOUS

## 2016-08-29 MED ORDER — LIDOCAINE HCL (PF) 1 % IJ SOLN
INTRAMUSCULAR | Status: AC
  Filled 2016-08-29: qty 5

## 2016-08-29 MED ORDER — LORAZEPAM 2 MG/ML IJ SOLN
0.5000 mg | INTRAMUSCULAR | Status: DC | PRN
  Administered 2016-08-29: 0.5 mg via INTRAVENOUS

## 2016-08-29 MED ORDER — LORAZEPAM 2 MG/ML IJ SOLN
INTRAMUSCULAR | Status: AC
  Administered 2016-08-29: 0.5 mg via INTRAVENOUS
  Filled 2016-08-29: qty 1

## 2016-08-29 MED ORDER — ONDANSETRON HCL 4 MG/2ML IJ SOLN: 4.0000 mg | Freq: Once | INTRAMUSCULAR | Status: DC | PRN

## 2016-08-29 SURGICAL SUPPLY — 44 items
BANDAGE ELASTIC 2 CLIP ST LF (GAUZE/BANDAGES/DRESSINGS) ×2 IMPLANT
BANDAGE ELASTIC 3 CLIP ST LF (GAUZE/BANDAGES/DRESSINGS) ×2 IMPLANT
BIT DRILL CALIBRATED 1.8MM (BIT) ×1 IMPLANT
BNDG ESMARK 4X12 TAN STRL LF (GAUZE/BANDAGES/DRESSINGS) ×2 IMPLANT
BRUSH SCRUB EZ  4% CHG (MISCELLANEOUS) ×2
BRUSH SCRUB EZ 4% CHG (MISCELLANEOUS) ×2 IMPLANT
CANISTER SUCT 1200ML W/VALVE (MISCELLANEOUS) ×2 IMPLANT
CAST PADDING 3X4FT ST 30246 (SOFTGOODS) ×1
CHLORAPREP W/TINT 26ML (MISCELLANEOUS) ×4 IMPLANT
CORD BIP STRL DISP 12FT (MISCELLANEOUS) ×2 IMPLANT
CUFF TOURN 18 STER (MISCELLANEOUS) IMPLANT
CUFF TOURN 24 STER (MISCELLANEOUS) ×2 IMPLANT
DRAPE FLUOR MINI C-ARM 54X84 (DRAPES) ×2 IMPLANT
DRAPE SHEET LG 3/4 BI-LAMINATE (DRAPES) ×2 IMPLANT
DRAPE SURG 17X11 SM STRL (DRAPES) ×2 IMPLANT
DRILL CALIBRATED 1.8MM (BIT) ×2
ELECT REM PT RETURN 9FT ADLT (ELECTROSURGICAL) ×2
ELECTRODE REM PT RTRN 9FT ADLT (ELECTROSURGICAL) ×1 IMPLANT
FORCEPS JEWEL BIP 4-3/4 STR (INSTRUMENTS) ×2 IMPLANT
GAUZE PETRO XEROFOAM 1X8 (MISCELLANEOUS) ×2 IMPLANT
GAUZE SPONGE 4X4 12PLY STRL (GAUZE/BANDAGES/DRESSINGS) ×2 IMPLANT
GLOVE INDICATOR 8.0 STRL GRN (GLOVE) ×2 IMPLANT
GLOVE SURG ORTHO 8.0 STRL STRW (GLOVE) ×4 IMPLANT
GOWN STRL REUS W/ TWL LRG LVL3 (GOWN DISPOSABLE) ×1 IMPLANT
GOWN STRL REUS W/ TWL XL LVL3 (GOWN DISPOSABLE) ×1 IMPLANT
GOWN STRL REUS W/TWL LRG LVL3 (GOWN DISPOSABLE) ×2
GOWN STRL REUS W/TWL XL LVL3 (GOWN DISPOSABLE) ×1
KIT RM TURNOVER STRD PROC AR (KITS) ×2 IMPLANT
NS IRRIG 1000ML POUR BTL (IV SOLUTION) ×2 IMPLANT
PACK EXTREMITY ARMC (MISCELLANEOUS) ×2 IMPLANT
PAD ABD DERMACEA PRESS 5X9 (GAUZE/BANDAGES/DRESSINGS) ×2 IMPLANT
PAD CAST CTTN 3X4 STRL (SOFTGOODS) ×1 IMPLANT
PADDING CAST COTTON 3X4 STRL (SOFTGOODS) ×1
PLATE HYBIRD SLIMLINE 42MM (Plate) ×2 IMPLANT
SCREW CORTEX 2.4X12MM (Screw) ×2 IMPLANT
SCREW CORTEX 2.4X14 (Screw) ×4 IMPLANT
SCREW CORTEX SLFTPNG 10MM 2.4 (Screw) ×2 IMPLANT
SCREW LOCKING 2.4X8MM (Screw) ×2 IMPLANT
SPLINT CAST 1 STEP 3X12 (MISCELLANEOUS) ×2 IMPLANT
STAPLER SKIN PROX 35W (STAPLE) ×2 IMPLANT
SUT ETHILON 3 0 PS 1 (SUTURE) ×2 IMPLANT
SUT VIC AB 3-0 SH 27 (SUTURE) ×2
SUT VIC AB 3-0 SH 27X BRD (SUTURE) ×2 IMPLANT
TOWEL OR 17X26 4PK STRL BLUE (TOWEL DISPOSABLE) ×2 IMPLANT

## 2016-08-29 NOTE — Anesthesia Procedure Notes (Signed)
Anesthesia Regional Block: Supraclavicular block   Pre-Anesthetic Checklist: ,, timeout performed, Correct Patient, Correct Site, Correct Laterality, Correct Procedure, Correct Position, site marked, Risks and benefits discussed,  Surgical consent,  Pre-op evaluation,  At surgeon's request and post-op pain management  Laterality: Left and Upper  Prep: chloraprep       Needles:  Injection technique: Single-shot  Needle Type: Echogenic Needle     Needle Length: 9cm  Needle Gauge: 22     Additional Needles:   Procedures: ultrasound guided,,,,,,,,  Narrative:  Start time: 08/29/2016 12:51 PM End time: 08/29/2016 1:02 PM Injection made incrementally with aspirations every 5 mL.  Performed by: Personally  Anesthesiologist: Martha Clan  Additional Notes: Functioning IV was confirmed and monitors were applied.  A echogenic needle was used. Sterile prep,hand hygiene and sterile gloves were used. Minimal sedation used for procedure.   No paresthesia endorsed by patient during the procedure.  Negative aspiration and negative test dose prior to incremental administration of local anesthetic. The patient tolerated the procedure well with no immediate complications.

## 2016-08-29 NOTE — Anesthesia Preprocedure Evaluation (Signed)
Anesthesia Evaluation  Patient identified by MRN, date of birth, ID band Patient awake    Reviewed: Allergy & Precautions, H&P , NPO status , Patient's Chart, lab work & pertinent test results, reviewed documented beta blocker date and time   History of Anesthesia Complications Negative for: history of anesthetic complications  Airway Mallampati: II  TM Distance: >3 FB Neck ROM: full    Dental  (+) Poor Dentition, Caps, Dental Advidsory Given   Pulmonary neg shortness of breath, sleep apnea and Continuous Positive Airway Pressure Ventilation , neg COPD, Recent URI , Resolved,           Cardiovascular Exercise Tolerance: Good hypertension, + angina (chronic, stable) + CAD (3 vessels with 50% blockage) and + Peripheral Vascular Disease  (-) Past MI, (-) Cardiac Stents and (-) CABG + dysrhythmias (-) Valvular Problems/Murmurs     Neuro/Psych PSYCHIATRIC DISORDERS (Depression) negative neurological ROS     GI/Hepatic Neg liver ROS, hiatal hernia, GERD  ,  Endo/Other  diabetesHypothyroidism   Renal/GU negative Renal ROS  negative genitourinary   Musculoskeletal   Abdominal   Peds  Hematology negative hematology ROS (+)   Anesthesia Other Findings Past Medical History: No date: Anginal pain ([place]) No date: Anxiety No date: Arthritis No date: Chronic back pain     Comment:  stenosis.degenerative disc,some scoliosis No date: Constipation     Comment:  takes Stool Softener daily No date: Coronary artery disease No date: Depression     Comment:  takes Cymbalta daily No date: Diabetes mellitus     Comment:  Type 2 diabetic. Average fasting blood sugar runs high               170-200 No date: Diverticulosis No date: E coli infection No date: GERD (gastroesophageal reflux disease)     Comment:  takes Nexium daily No date: Headache 02/16/2016: Headache No date: Hemorrhoids No date: History of colon polyps     Comment:   benign No date: History of gout     Comment:  doesn't take any meds No date: History of hiatal hernia No date: History of vertigo     Comment:  doesn't take any meds No date: Hyperlipidemia     Comment:  takes Praluent daily No date: Hypertension     Comment:  currently BP medications are on hold  No date: Hypothyroidism     Comment:  takes Synthroid daily No date: Insomnia     Comment:  takes Restoril nightly No date: Joint pain No date: Joint swelling No date: Muscle spasm     Comment:  takes Robaxin as needed No date: NSVT (nonsustained ventricular tachycardia) ([facility]) No date: OSA on CPAP No date: Periodic heart flutter No date: Peripheral edema     Comment:  takes LAsix as needed No date: Peripheral vascular disease ([place])     Comment:  AAA as stated per pt / was just discovered and pt states              has not been referred to vascular MD  No date: Restless leg     Comment:  takes Requip daily No date: Rosacea No date: Sleep apnea No date: Urinary incontinence No date: Urinary urgency No date: Varicose veins No date: Weakness     Comment:  numbness and tingling.   Reproductive/Obstetrics negative OB ROS  Anesthesia Physical Anesthesia Plan  ASA: III  Anesthesia Plan: General and Regional   Post-op Pain Management: GA combined w/ Regional for post-op pain   Induction:   PONV Risk Score and Plan: 3 and Ondansetron, Dexamethasone, Propofol and Midazolam  Airway Management Planned: LMA  Additional Equipment:   Intra-op Plan:   Post-operative Plan: Extubation in OR  Informed Consent: I have reviewed the patients History and Physical, chart, labs and discussed the procedure including the risks, benefits and alternatives for the proposed anesthesia with the patient or authorized representative who has indicated his/her understanding and acceptance.   Dental Advisory Given  Plan Discussed with:  Anesthesiologist, CRNA and Surgeon  Anesthesia Plan Comments:         Anesthesia Quick Evaluation

## 2016-08-29 NOTE — Pre-Procedure Instructions (Signed)
Faxed request for H&P.  DOS 08/29/16.

## 2016-08-29 NOTE — Anesthesia Post-op Follow-up Note (Cosign Needed)
Anesthesia QCDR form completed.        

## 2016-08-29 NOTE — OR Nursing (Signed)
To PACU via stretcher, bay 8 for block

## 2016-08-29 NOTE — Telephone Encounter (Addendum)
Pt called to let us know that she has not had any further episodes of chest pain. Will forward this info to [facility].

## 2016-08-29 NOTE — H&P (Signed)
The patient has been re-examined, and the chart reviewed, and there have been no interval changes to the documented history and physical.  Plan a left wrist spanning plate versus external fixator today.  Anesthesia is consulted regarding a peripheral nerve block for post-operative pain.  The risks, benefits, and alternatives have been discussed at length, and the patient is willing to proceed.

## 2016-08-29 NOTE — Anesthesia Procedure Notes (Addendum)
Procedure Name: Intubation Date/Time: 08/29/2016 1:23 PM Performed by: Rosaria Ferries, Yared Susan Pre-anesthesia Checklist: Patient identified, Emergency Drugs available, Suction available and Patient being monitored Patient Re-evaluated:Patient Re-evaluated prior to induction Oxygen Delivery Method: Circle system utilized Preoxygenation: Pre-oxygenation with 100% oxygen Induction Type: IV induction Laryngoscope Size: Miller and 3 Grade View: Grade II Tube size: 7.0 mm Number of attempts: 2 Airway Equipment and Method: Bougie stylet Placement Confirmation: positive ETCO2 and breath sounds checked- equal and bilateral Secured at: 22 cm Tube secured with: Tape Dental Injury: Teeth and Oropharynx as per pre-operative assessment  Difficulty Due To: Difficult Airway- due to anterior larynx and Difficult Airway- due to reduced neck mobility Future Recommendations: Recommend- induction with short-acting agent, and alternative techniques readily available

## 2016-08-29 NOTE — Progress Notes (Signed)
Alert , denies pain at op site , 02 sat 96 % on room air , denies nausea tolerated crackers and juice without difficulty . discharged home with husband.

## 2016-08-29 NOTE — OR Nursing (Signed)
preop report given to Fortunato Curling, RN in preop by Lorie Apley, RN

## 2016-08-29 NOTE — Op Note (Signed)
08/29/2016  12:39 PM  PATIENT:  [name]    PRE-OPERATIVE DIAGNOSIS:  S52.502A Unspecified fracture of the lower end of left radius, closed fracture  POST-OPERATIVE DIAGNOSIS:  Same  PROCEDURE:  OPEN REDUCTION INTERNAL FIXATION (ORIF) DISTAL RADIAL FRACTURE  SURGEON:  Lovell Sheehan, MD  TOURNIQUET TIME:  51  MIN  ANESTHESIA:   General and Block  PREOPERATIVE INDICATIONS:  [name] is a  71 y.o. female with a diagnosis of S52.502A Unspecified fracture of the lower end of left radius, closed fracture who failed conservative measures and elected for surgical management.    The risks benefits and alternatives were discussed with the patient preoperatively including but not limited to the risks of infection, bleeding, nerve injury, malunion, nonunion, wrist stiffness, persistent wrist pain, osteoarthritis and the need for further surgery. Medical risks include but are not limited to DVT and pulmonary embolism, myocardial infarction, stroke, pneumonia, respiratory failure and death. Patient  understood these risks and wished to proceed.   OPERATIVE IMPLANTS:  Synthes wrist spanning plate  OPERATIVE FINDINGS: Comminuted, displaced, intra-articular fracture of the distal radius with more than 3 major fragments  OPERATIVE PROCEDURE: Patient was seen in the preoperative area. I marked the operative wrist according tl the hospital's correct site of surgery protocol. Patient was then brought to the operating roomand was placed supine on the operative table and underwent general anesthesia with an LMA.   The operative arm was prepped and draped in a sterile fashion. A timeout performed to verify the patient's name, date of birth, medical record number, correct site of surgery correct procedure to be performed. The timeout was also used a timeout to verify patient received antibiotics and appropriate instruments, implants and radiographs studies were available in the room. Once all in  attendance were in agreement case began.   Patient then had the operative extremity exsanguinated with an Esmarch. The tourniquet was placed on the upper extremity and inflated 250 mm.    We began by making incision over the 2nd metacarpal. Dissection was carried down through skin and subcutaneous tissue and bipolar cautery was used for hemostasis. We gently retracted the extensor tendons in order to expose the metacarpal shaft.   We then selected our Synthes spanning plate. We laid the plate over the dorsal aspect of the hand and with retraction we used this to judge our proximal incision. We passed a periosteal elevator from distal to proximal to create a path for the plate. We then brought our plate into the field and passed it from distal to proximal taking care to watch it slide underneath the tendons and be sure that we were not under the superficial radial sensory nerve in the proximal forearm. We then drilled into the 2nd metacarpal shaft and placed a nonlocking 2.7 mm screw to bring the plate to the metacarpal. We then went proximal and created a space to place a serrated bone clamp. Then by using a combination of distraction supination we reduced the fracture and directly and the bone clamp was placed to hold the length. Fluoroscopy confirmed excellent articular reduction and length. We then placed 3.5 mm screw in the proximal portion of the plate. 2 additional 2.7 mm screws were placed distally into the 2nd metacarpal and 2 more 3.5 mm screws were placed in the proximal part of the plate into the radial shaft.  We then brought fluoroscopy back and x-rays were taken which confirmed excellent fracture alignment and length of our screws. The tourniquet  was released hemostasis was obtained.   The skin was closed with staples. Xeroform and a dry sterile dressing were applied along with a volar splint. I was scrubbed and present for the entire case and all sharp and instrument counts were correct at  the conclusion the case. The patient tolerated this procedure well and was awakened and taken to the recovery room in good condition.   [name]. Ana Mares, MD

## 2016-08-29 NOTE — Transfer of Care (Signed)
Immediate Anesthesia Transfer of Care Note  Patient: [name]  Procedure(s) Performed: Procedure(s): OPEN REDUCTION INTERNAL FIXATION (ORIF) DISTAL RADIAL FRACTURE (Left)  Patient Location: PACU  Anesthesia Type:General  Level of Consciousness: awake, alert , oriented and patient cooperative  Airway & Oxygen Therapy: Patient Spontanous Breathing and Patient connected to face mask oxygen  Post-op Assessment: Report given to RN and Post -op Vital signs reviewed and stable  Post vital signs: Reviewed and stable  Last Vitals:  Vitals:   08/29/16 1300 08/29/16 1308  BP: (!) 124/59 (!) 112/53  Pulse: 72 64  Resp: 14 11  Temp:      Last Pain:  Vitals:   08/29/16 1151  TempSrc: Oral  PainSc: 7          Complications: No apparent anesthesia complications

## 2016-08-29 NOTE — Discharge Instructions (Signed)

## 2016-08-30 ENCOUNTER — Encounter: Payer: Self-pay | Admitting: Orthopedic Surgery

## 2016-08-31 NOTE — Anesthesia Postprocedure Evaluation (Signed)
Anesthesia Post Note  Patient: [name]  Procedure(s) Performed: Procedure(s) (LRB): OPEN REDUCTION INTERNAL FIXATION (ORIF) DISTAL RADIAL FRACTURE (Left)  Patient location during evaluation: PACU Anesthesia Type: Regional and General Level of consciousness: awake and alert Pain management: pain level controlled Vital Signs Assessment: post-procedure vital signs reviewed and stable Respiratory status: spontaneous breathing, nonlabored ventilation, respiratory function stable and patient connected to nasal cannula oxygen Cardiovascular status: blood pressure returned to baseline and stable Postop Assessment: no signs of nausea or vomiting Anesthetic complications: no     Last Vitals:  Vitals:   08/29/16 1733 08/29/16 1754  BP: (!) 122/49 (!) 123/59  Pulse: 85 83  Resp: 14 16  Temp: (!) 36.2 C     Last Pain:  Vitals:   08/30/16 1045  TempSrc:   PainSc: 0-No pain                 Martha Clan

## 2016-09-03 ENCOUNTER — Ambulatory Visit: Payer: Medicare Other | Admitting: Cardiovascular Disease

## 2016-09-17 ENCOUNTER — Ambulatory Visit: Payer: Medicare Other | Attending: Family | Admitting: Family

## 2016-09-17 ENCOUNTER — Encounter: Payer: Self-pay | Admitting: Family

## 2016-09-17 VITALS — BP 119/55 | HR 78 | Resp 20 | Ht 63.0 in | Wt 220.0 lb

## 2016-09-17 DIAGNOSIS — I2510 Atherosclerotic heart disease of native coronary artery without angina pectoris: Secondary | ICD-10-CM | POA: Diagnosis not present

## 2016-09-17 DIAGNOSIS — Z9989 Dependence on other enabling machines and devices: Secondary | ICD-10-CM

## 2016-09-17 DIAGNOSIS — G4700 Insomnia, unspecified: Secondary | ICD-10-CM | POA: Insufficient documentation

## 2016-09-17 DIAGNOSIS — E1151 Type 2 diabetes mellitus with diabetic peripheral angiopathy without gangrene: Secondary | ICD-10-CM | POA: Diagnosis not present

## 2016-09-17 DIAGNOSIS — E039 Hypothyroidism, unspecified: Secondary | ICD-10-CM | POA: Diagnosis not present

## 2016-09-17 DIAGNOSIS — F419 Anxiety disorder, unspecified: Secondary | ICD-10-CM | POA: Diagnosis not present

## 2016-09-17 DIAGNOSIS — G2581 Restless legs syndrome: Secondary | ICD-10-CM | POA: Diagnosis not present

## 2016-09-17 DIAGNOSIS — Z7982 Long term (current) use of aspirin: Secondary | ICD-10-CM | POA: Insufficient documentation

## 2016-09-17 DIAGNOSIS — I5032 Chronic diastolic (congestive) heart failure: Secondary | ICD-10-CM | POA: Diagnosis present

## 2016-09-17 DIAGNOSIS — M109 Gout, unspecified: Secondary | ICD-10-CM | POA: Diagnosis not present

## 2016-09-17 DIAGNOSIS — F329 Major depressive disorder, single episode, unspecified: Secondary | ICD-10-CM | POA: Insufficient documentation

## 2016-09-17 DIAGNOSIS — Z79899 Other long term (current) drug therapy: Secondary | ICD-10-CM | POA: Diagnosis not present

## 2016-09-17 DIAGNOSIS — Z880 Allergy status to penicillin: Secondary | ICD-10-CM | POA: Insufficient documentation

## 2016-09-17 DIAGNOSIS — I110 Hypertensive heart disease with heart failure: Secondary | ICD-10-CM | POA: Insufficient documentation

## 2016-09-17 DIAGNOSIS — G4733 Obstructive sleep apnea (adult) (pediatric): Secondary | ICD-10-CM | POA: Diagnosis not present

## 2016-09-17 DIAGNOSIS — E785 Hyperlipidemia, unspecified: Secondary | ICD-10-CM | POA: Diagnosis not present

## 2016-09-17 DIAGNOSIS — E1159 Type 2 diabetes mellitus with other circulatory complications: Secondary | ICD-10-CM

## 2016-09-17 DIAGNOSIS — Z794 Long term (current) use of insulin: Secondary | ICD-10-CM | POA: Diagnosis not present

## 2016-09-17 DIAGNOSIS — L719 Rosacea, unspecified: Secondary | ICD-10-CM | POA: Diagnosis not present

## 2016-09-17 DIAGNOSIS — Z885 Allergy status to narcotic agent status: Secondary | ICD-10-CM | POA: Diagnosis not present

## 2016-09-17 DIAGNOSIS — K219 Gastro-esophageal reflux disease without esophagitis: Secondary | ICD-10-CM | POA: Diagnosis not present

## 2016-09-17 DIAGNOSIS — I10 Essential (primary) hypertension: Secondary | ICD-10-CM

## 2016-09-17 DIAGNOSIS — M419 Scoliosis, unspecified: Secondary | ICD-10-CM | POA: Insufficient documentation

## 2016-09-17 NOTE — Progress Notes (Signed)
Patient ID: [name], female    DOB: [birth year], 71 y.o.   MRN: [id number]  HPI  [name] is a 71 y/o female with a history of anxiety, CAD, depression, DM, GERD, gout, hyperlipidemia, HTN, hypothyroidism, obstructive sleep apnea (CPAP), PVD, NSVT and chronic heart failure.   Reviewed last echo report from 07/26/16 which showed an EF of 60-65%.  Admitted for same day surgery on 08/29/16 for ORIF of left radial fracture. Was in the ED 08/23/16 after a mechanical fall in her garage. Diagnosed with radial fracture. Discharged with orthopaedic follow-up the next day.  Admitted 07/23/16 due to COPD/HF exacerbation. Initially needed bipap and then transitioned to room air. Nebulizers PRN along with IV steroids. Initially needed IV diuretics. Cardiology consult obtained. Discharged home after 5 days with home health nursing and physical therapy. Admitted 06/24/16 with left lower quadrant abdominal pain. Admitted overnight and diagnosed with abdominal spasms and discharged with levsin wit PCP follow-up.   She presents today for a follow-up visit with a chief complaint of moderate fatigue with minimal exertion. She describes this as having been present for several years with varying levels of severity. She has associated cough, shortness of breath, edema, palpitations and light-headedness along with this. She feels like she is sleeping well.   Past Medical History:  Diagnosis Date  . Anginal pain ([place])   . Anxiety   . Arthritis   . Chronic back pain    stenosis.degenerative disc,some scoliosis  . Constipation    takes Stool Softener daily  . Coronary artery disease   . Depression    takes Cymbalta daily  . Diabetes mellitus    Type 2 diabetic. Average fasting blood sugar runs high 170-200  . Diverticulosis   . E coli infection   . GERD (gastroesophageal reflux disease)    takes Nexium daily  . Headache   . Headache 02/16/2016  . Hemorrhoids   . History of colon polyps    benign  . History  of gout    doesn't take any meds  . History of hiatal hernia   . History of vertigo    doesn't take any meds  . Hyperlipidemia    takes Praluent daily  . Hypertension    currently BP medications are on hold   . Hypothyroidism    takes Synthroid daily  . Insomnia    takes Restoril nightly  . Joint pain   . Joint swelling   . Muscle spasm    takes Robaxin as needed  . NSVT (nonsustained ventricular tachycardia) ([place])   . OSA on CPAP   . Periodic heart flutter   . Peripheral edema    takes LAsix as needed  . Peripheral vascular disease ([place])    AAA as stated per pt / was just discovered and pt states has not been referred to vascular MD   . Restless leg    takes Requip daily  . Rosacea   . Sleep apnea   . Urinary incontinence   . Urinary urgency   . Varicose veins   . Weakness    numbness and tingling.   Past Surgical History:  Procedure Laterality Date  . ABDOMINAL HYSTERECTOMY     with BSo  . CARDIAC CATHETERIZATION  2013   Normal  . CARPAL TUNNEL RELEASE Bilateral   . CATARACT EXTRACTION, BILATERAL    . CHOLECYSTECTOMY    . COLONOSCOPY    . DIAGNOSTIC LAPAROSCOPY     multiple times  . DILATION  AND CURETTAGE OF UTERUS    . ESOPHAGOGASTRODUODENOSCOPY (EGD) WITH PROPOFOL N/A 11/01/2014   Procedure: ESOPHAGOGASTRODUODENOSCOPY (EGD) WITH PROPOFOL;  Surgeon: Hulen Luster, MD;  Location: [facility] ENDOSCOPY;  Service: Gastroenterology;  Laterality: N/A;  . IMPLANTABLE CONTACT LENS IMPLANTATION     bilateral  . LAMINECTOMY  11/13/2015  . LUMBAR WOUND DEBRIDEMENT N/A 12/02/2015   Procedure: WOUND Exploration;  Surgeon: Consuella Lose, MD;  Location: [facility];  Service: Neurosurgery;  Laterality: N/A;  . OPEN REDUCTION INTERNAL FIXATION (ORIF) DISTAL RADIAL FRACTURE Left 08/29/2016   Procedure: OPEN REDUCTION INTERNAL FIXATION (ORIF) DISTAL RADIAL FRACTURE;  Surgeon: Lovell Sheehan, MD;  Location: [facility] ORS;  Service: Orthopedics;  Laterality: Left;  . PICC LINE PLACE PERIPHERAL  ([place] [facility])     right upper arm   . ROTATOR CUFF REPAIR Left   . SAVORY DILATION N/A 11/01/2014   Procedure: SAVORY DILATION;  Surgeon: Hulen Luster, MD;  Location: [facility] ENDOSCOPY;  Service: Gastroenterology;  Laterality: N/A;  . TONSILLECTOMY    . TRIGGER FINGER RELEASE Bilateral    Family History  Problem Relation Age of Onset  . Heart disease Mother   . Breast cancer Mother 77  . Heart disease Father   . Heart attack Sister   . Heart disease Sister   . Heart disease Brother    Social History  Substance Use Topics  . Smoking status: Never Smoker  . Smokeless tobacco: Never Used  . Alcohol use No   Allergies  Allergen Reactions  . Ceftin [Cefuroxime Axetil] Diarrhea  . Codeine Rash       . Penicillins Rash    Has patient had a PCN reaction causing immediate rash, facial/tongue/throat swelling, SOB or lightheadedness with hypotension: YES Has patient had a PCN reaction causing severe rash involving mucus membranes or skin necrosis: NO Has patient had a PCN retioion that required hospitalization NO Has patient had a PCN reaction occurring within the last 10 years: NO If all of the above answers are "NO", then may proceed with Cephalosporin use.  . Vicodin [Hydrocodone-Acetaminophen] Other (See Comments)    passes out   Prior to Admission medications   Medication Sig Start Date End Date Taking? Authorizing Provider  albuterol (PROVENTIL HFA;VENTOLIN HFA) 108 (90 Base) MCG/ACT inhaler Inhale 2 puffs into the lungs every 6 (six) hours as needed for wheezing or shortness of breath. 07/28/16  Yes Sudini, Alveta Heimlich, MD  Alirocumab (PRALUENT) 150 MG/ML SOPN Inject 1 pen into the skin every 14 (fourteen) days. 01/12/16  Yes Minna Merritts, MD  aspirin 81 MG tablet Take 81 mg by mouth at bedtime.    Yes [provider]  Azelastine HCl (ASTEPRO) 0.15 % SOLN Place 2 sprays into both nostrils at bedtime as needed (allergies).   Yes [provider]  esomeprazole (NEXIUM) 40 MG  capsule Take 40 mg by mouth 2 (two) times daily before a meal.    Yes [provider]  etodolac (LODINE) 400 MG tablet Take 400 mg by mouth 2 (two) times daily.   Yes [provider]  gabapentin (NEURONTIN) 300 MG capsule Take 300 mg by mouth 3 (three) times daily.    Yes [provider]  hyoscyamine (LEVSIN SL) 0.125 MG SL tablet Place 2 tablets (0.25 mg total) under the tongue every 4 (four) hours as needed for cramping. 06/25/16  Yes Theodoro Grist, MD  insulin aspart (NOVOLOG) 100 UNIT/ML FlexPen Inject 10-16 Units into the skin See admin instructions. 10 units with breakfast,  10 units with lunch, 16 units at dinner, >200 increase by 2 units per sliding scale 03/01/15 07/23/17 Yes [provider]  insulin glargine (LANTUS) 100 UNIT/ML injection Inject 32 Units into the skin daily.    Yes [provider]  levothyroxine (SYNTHROID, LEVOTHROID) 75 MCG tablet Take 75 mcg by mouth daily before breakfast.  11/24/12  Yes [provider]  lisinopril (PRINIVIL,ZESTRIL) 5 MG tablet Take 1 tablet (5 mg total) by mouth daily. Patient taking differently: Take 5 mg by mouth at bedtime.  07/28/16  Yes Sudini, Alveta Heimlich, MD  metoprolol succinate (TOPROL-XL) 25 MG 24 hr tablet Take 25 mg by mouth at bedtime.   Yes [provider]  Multiple Vitamin (MULTIVITAMIN) tablet Take 1 tablet by mouth daily.   Yes [provider]  nitroGLYCERIN (NITROSTAT) 0.4 MG SL tablet Place 1 tablet (0.4 mg total) under the tongue every 5 (five) minutes as needed for chest pain. 03/06/16 09/18/17 Yes Gollan, Kathlene November, MD  ondansetron (ZOFRAN) 4 MG tablet Take 1 tablet (4 mg total) by mouth every 6 (six) hours as needed for nausea. 06/25/16  Yes Theodoro Grist, MD  potassium chloride SA (K-DUR,KLOR-CON) 20 MEQ tablet Take 2 tablets (40 mEq total) by mouth daily. Patient taking differently: Take 20 mEq by mouth daily.  08/10/16 09/17/16 Yes Alisa Graff, FNP  ranolazine  (RANEXA) 500 MG 12 hr tablet Take 1 tablet (500 mg total) by mouth 2 (two) times daily. Patient taking differently: Take 500 mg by mouth 2 (two) times daily as needed (angina).  08/17/16  Yes Gollan, Kathlene November, MD  ropinirole (REQUIP) 5 MG tablet Take 5 mg by mouth 2 (two) times daily.   Yes [provider]  tapentadol (NUCYNTA) 50 MG tablet Take 50 mg by mouth 3 (three) times daily as needed for moderate pain.    Yes [provider]  temazepam (RESTORIL) 30 MG capsule Take 30 mg by mouth at bedtime.   Yes [provider]  torsemide (DEMADEX) 20 MG tablet Take 2 tablets (40 mg total) by mouth daily. Patient taking differently: Take 20 mg by mouth daily.  08/10/16  Yes Darylene Price A, FNP  traMADol (ULTRAM) 50 MG tablet Take 100 mg by mouth 3 (three) times daily as needed for moderate pain.    Yes [provider]     Review of Systems  Constitutional: Positive for fatigue. Negative for appetite change.  HENT: Negative for congestion, postnasal drip and sore throat.   Eyes: Negative.   Respiratory: Positive for cough (dry cough) and shortness of breath (with minimal exertion). Negative for chest tightness.   Cardiovascular: Positive for palpitations (occasionally) and leg swelling (minimial left ankle). Negative for chest pain.  Gastrointestinal: Negative for abdominal distention and abdominal pain.  Endocrine: Negative.   Genitourinary: Negative.   Musculoskeletal: Positive for arthralgias (left wrist). Negative for back pain and neck pain.  Skin: Negative.   Allergic/Immunologic: Negative.   Neurological: Positive for light-headedness. Negative for dizziness.  Hematological: Negative for adenopathy. Bruises/bleeds easily.  Psychiatric/Behavioral: Negative for dysphoric mood and sleep disturbance (sleeping with CPAP). The patient is not nervous/anxious.    Vitals:   09/17/16 1024  BP: (!) 119/55  Pulse: 78  Resp: 20  SpO2: 93%  Weight: 220 lb (99.8  kg)  Height: 5\' 3"  (1.6 m)   Wt Readings from Last 3 Encounters:  09/17/16 220 lb (99.8 kg)  08/29/16 216 lb (98 kg)  08/28/16 213 lb (96.6 kg)  Lab Results  Component Value Date   CREATININE 1.32 (H) 07/28/2016   CREATININE 1.27 (H) 07/27/2016   CREATININE 1.25 (H) 07/26/2016    Physical Exam  Constitutional: She is oriented to person, place, and time. She appears well-developed and well-nourished.  HENT:  Head: Normocephalic and atraumatic.  Neck: Normal range of motion. Neck supple. No JVD present.  Cardiovascular: Normal rate and regular rhythm.   Pulmonary/Chest: Effort normal. She has no wheezes. She has no rales.  Abdominal: Soft. She exhibits no distension. There is no tenderness.  Musculoskeletal: She exhibits edema (trace pitting edema around left ankle). She exhibits no tenderness.  Neurological: She is alert and oriented to person, place, and time.  Skin: Skin is warm and dry.  Psychiatric: She has a normal mood and affect. Her behavior is normal. Thought content normal.  Nursing note and vitals reviewed.   Assessment & Plan:  1: Chronic heart failure with preserved ejection fraction- - NYHA class III - euvolemic at this time - has been weighing daily and reports a stable weight. Reminded to call for an overnight weight gain of >2 pounds or a weekly weight gain of >5 pounds; weight is down 3 pounds from her last visit here  - BMP drawn on 08/16/16 reviewed and showed potassium 4.0 and GFR 40  - uses lite salt along with greek seasoning but is unsure if there's salt in the greek seasoning or not. Discussed the importance of closely following a 2000mg  sodium diet  - sees cardiologist Rockey Situ) 10/03/16 - can take extra 20mg  torsemide if needed based on weight/swelling which she says she's done a couple of times in the last week or so. Reminded her that if she takes an extra torsemide that she takes an extra potassium tablet as well - is doing some walking of her dogs  and around her house  2: HTN- - BP looks good today - she has decreased her metoprolol XL to 25mg  daily herself and says that she feels a little better since doing that - saw PCP Sabra Heck) 08/28/16 and returns on 10/08/16  3: Diabetes- - glucose this morning at home was 126 - A1c on 07/23/16 was 6.3% - continues to use novolog and lantus  4: Obstructive sleep apnea-  - wearing CPAP nightly  Return here in 3 months or sooner for any questions/problems before then.

## 2016-09-17 NOTE — Patient Instructions (Addendum)
Continue weighing daily and call for an overnight weight gain of > 2 pounds or a weekly weight gain of >5 pounds.  If you take an extra torsemide, make sure you take an extra potasium tablet along with this.

## 2016-10-02 NOTE — Progress Notes (Signed)
Cardiology Office Note  Date:  10/03/2016   ID:  [name], DOB 09-15-1945, MRN [id number]  PCP:  Rusty Aus, MD   Chief Complaint  Patient presents with  . other    6 month follow up. Patient c/o swelling in ankles. Patient will be having surgery next friday on her left hand. Meds reviewed verbally with patient.     HPI:  71 year old woman with  CAD,  cardiac catheterization in 2009 and June 2013 showing moderate LAD, diagonal and RCA disease,  Obesity,  diabetes,  hyperlipidemia with statin intolerance,  obstructive sleep apnea on CPAP,  Tachycardia episodes,  previous leg edema,  seen in the hospital for severe hypertension and chest pain,  who presents for routine followup of her coronary artery disease and diastolic CHF  Episode of pyelonephritis May 2018 Back in the hospital June 2620 with diastolic CHF, shortness of breath Also with bronchitis requiring steroids She was [place] the beach eating out every [place], had significant weight gain and was not taking Lasix  In follow-up she is taking Lasix 20 mg daily, sometimes 40 mg for ankle swelling or weight gain  Suffered a fall resulting in Left wrist fracture, required a placement of plate Then ligamental injury after moving her car seat Reports she needs a tendon transfer Possibly scheduled for 10 days from now  Dry weight 218 here, At home 219  Lab work reviewed with her HBa1C 6.2 Total cholesterol down to 170, previously 230s She is taking praluentThis is up for review per the patient.   EKG personally reviewed by myself on todays visit Shows normal sinus rhythm with rate 85 bpm no significant ST or T-wave changes  Other past medical history reviewed Had back surgery 11/14/2015 Went to rehab Got postop infection, ecoli, sepsis crp 30, sed rate 130 Temp in October 2017 Drain placed 02/15/2016, rare ecoli on ABX, picc line in place Still with significant back pain No fevers,  Insulin has been  increased.  Other past medical history using her CPAP. Previous blood work before praluent, total cholesterol 238, triglycerides 462, hemoglobin A1c 7.0 She was unable to tolerate Vytorin as well as other statins  Admission on October 1, discharge on November 14 2010. She ruled out by cardiac enzymes, chest CT was negative for PE. She had a stress test that showed no ischemia. Her medications were adjusted. CT Scan did show fatty infiltration of the liver, granulomatous lesion of the left lower lobe  cardiac catheterization 07/25/2011 for chest pain  This showed left dominant coronary system with moderate mid LAD, proximal diagonal #1 and proximal RCA disease all estimated at 50%, normal LV systolic function.  PMH:   has a past medical history of Anginal pain ([place]); Anxiety; Arthritis; CHF (congestive heart failure) ([place]); Chronic back pain; Constipation; Coronary artery disease; Depression; Diabetes mellitus; Diverticulosis; E coli infection; GERD (gastroesophageal reflux disease); Headache; Headache (02/16/2016); Hemorrhoids; History of colon polyps; History of gout; History of hiatal hernia; History of vertigo; Hyperlipidemia; Hypertension; Hypothyroidism; Insomnia; Joint pain; Joint swelling; Muscle spasm; NSVT (nonsustained ventricular tachycardia) ([place]); OSA on CPAP; Periodic heart flutter; Peripheral edema; Peripheral vascular disease ([place]); Restless leg; Rosacea; Sleep apnea; Urinary incontinence; Urinary urgency; Varicose veins; and Weakness.  PSH:    Past Surgical History:  Procedure Laterality Date  . ABDOMINAL HYSTERECTOMY     with BSo  . CARDIAC CATHETERIZATION  2013   Normal  . CARPAL TUNNEL RELEASE Bilateral   . CATARACT EXTRACTION, BILATERAL    .  CHOLECYSTECTOMY    . COLONOSCOPY    . DIAGNOSTIC LAPAROSCOPY     multiple times  . DILATION AND CURETTAGE OF UTERUS    . ESOPHAGOGASTRODUODENOSCOPY (EGD) WITH PROPOFOL N/A 11/01/2014   Procedure: ESOPHAGOGASTRODUODENOSCOPY (EGD)  WITH PROPOFOL;  Surgeon: Hulen Luster, MD;  [facility];  Service: Gastroenterology;  Laterality: N/A;  . IMPLANTABLE CONTACT LENS IMPLANTATION     bilateral  . LAMINECTOMY  11/13/2015  . LUMBAR WOUND DEBRIDEMENT N/A 12/02/2015   Procedure: WOUND Exploration;  Surgeon: Consuella Lose, MD;  Location: [place];  Service: Neurosurgery;  Laterality: N/A;  . OPEN REDUCTION INTERNAL FIXATION (ORIF) DISTAL RADIAL FRACTURE Left 08/29/2016   Procedure: OPEN REDUCTION INTERNAL FIXATION (ORIF) DISTAL RADIAL FRACTURE;  Surgeon: Lovell Sheehan, MD;  Location: [facility] ORS;  Service: Orthopedics;  Laterality: Left;  . PICC LINE PLACE PERIPHERAL ([place] HX)     right upper arm   . ROTATOR CUFF REPAIR Left   . SAVORY DILATION N/A 11/01/2014   Procedure: SAVORY DILATION;  Surgeon: Hulen Luster, MD;  Location: [facility] ENDOSCOPY;  Service: Gastroenterology;  Laterality: N/A;  . TONSILLECTOMY    . TRIGGER FINGER RELEASE Bilateral     Current Outpatient Prescriptions  Medication Sig Dispense Refill  . albuterol (PROVENTIL HFA;VENTOLIN HFA) 108 (90 Base) MCG/ACT inhaler Inhale 2 puffs into the lungs every 6 (six) hours as needed for wheezing or shortness of breath. 1 Inhaler 0  . Alirocumab (PRALUENT) 150 MG/ML SOPN Inject 1 pen into the skin every 14 (fourteen) days. 6 pen 12  . aspirin 81 MG tablet Take 81 mg by mouth at bedtime.     . Azelastine HCl (ASTEPRO) 0.15 % SOLN Place 2 sprays into both nostrils at bedtime as needed (allergies).    [name] esomeprazole (NEXIUM) 40 MG capsule Take 40 mg by mouth 2 (two) times daily before a meal.     . etodolac (LODINE) 400 MG tablet Take 400 mg by mouth 2 (two) times daily.    [name] gabapentin (NEURONTIN) 300 MG capsule Take 300 mg by mouth 3 (three) times daily.     . hyoscyamine (LEVSIN SL) 0.125 MG SL tablet Place 2 tablets (0.25 mg total) under the tongue every 4 (four) hours as needed for cramping. 30 tablet 0  . insulin aspart (NOVOLOG) 100 UNIT/ML FlexPen Inject 10-16  Units into the skin See admin instructions. 10 units with breakfast, 10 units with lunch, 16 units at dinner, >200 increase by 2 units per sliding scale    . insulin glargine (LANTUS) 100 UNIT/ML injection Inject 32 Units into the skin daily.     [name] levothyroxine (SYNTHROID, LEVOTHROID) 75 MCG tablet Take 75 mcg by mouth daily before breakfast.     . lisinopril (PRINIVIL,ZESTRIL) 5 MG tablet Take 1 tablet (5 mg total) by mouth daily. (Patient taking differently: Take 5 mg by mouth at bedtime. )    . metoprolol succinate (TOPROL-XL) 25 MG 24 hr tablet Take 25 mg by mouth at bedtime.    . Multiple Vitamin (MULTIVITAMIN) tablet Take 1 tablet by mouth daily.    . nitroGLYCERIN (NITROSTAT) 0.4 MG SL tablet Place 1 tablet (0.4 mg total) under the tongue every 5 (five) minutes as needed for chest pain. 25 tablet 3  . ondansetron (ZOFRAN) 4 MG tablet Take 1 tablet (4 mg total) by mouth every 6 (six) hours as needed for nausea. 20 tablet 0  . ranolazine (RANEXA) 500 MG 12 hr tablet Take 1 tablet (500 mg total)  by mouth 2 (two) times daily. (Patient taking differently: Take 500 mg by mouth 2 (two) times daily as needed (angina). ) 90 tablet 3  . ropinirole (REQUIP) 5 MG tablet Take 5 mg by mouth 2 (two) times daily.    . tapentadol (NUCYNTA) 50 MG tablet Take 50 mg by mouth 3 (three) times daily as needed for moderate pain.     [name] temazepam (RESTORIL) 30 MG capsule Take 30 mg by mouth at bedtime.    . torsemide (DEMADEX) 20 MG tablet Take 2 tablets (40 mg total) by mouth daily. (Patient taking differently: Take 20 mg by mouth daily. ) 60 tablet 5  . traMADol (ULTRAM) 50 MG tablet Take 100 mg by mouth 3 (three) times daily as needed for moderate pain.     . potassium chloride SA (K-DUR,KLOR-CON) 20 MEQ tablet Take 2 tablets (40 mEq total) by mouth daily. (Patient taking differently: Take 20 mEq by mouth daily. ) 60 tablet 3   No current facility-administered medications for this visit.      Allergies:    Ceftin [cefuroxime axetil]; Codeine; Penicillins; and Vicodin [hydrocodone-acetaminophen]   Social History:  The patient  reports that she has never smoked. She has never used smokeless tobacco. She reports that she does not drink alcohol or use drugs.   Family History:   family history includes Breast cancer (age of onset: 69) in her mother; Heart attack in her sister; Heart disease in her brother, father, mother, and sister.    Review of Systems: Review of Systems  Constitutional: Negative.   Respiratory: Negative.   Cardiovascular: Positive for leg swelling.  Gastrointestinal: Negative.   Musculoskeletal: Positive for joint pain.       Left wrist pain  Neurological: Negative.   Psychiatric/Behavioral: Negative.   All other systems reviewed and are negative.    PHYSICAL EXAM: VS:  BP 138/68 (BP Location: Right Arm, Patient Position: Sitting, Cuff Size: Normal)   Pulse 85   Ht 5\' 3"  (1.6 m)   Wt 218 lb 12 oz (99.2 kg)   BMI 38.75 kg/m  , BMI Body mass index is 38.75 kg/m. GEN: Well nourished, well developed, in no acute distress, obese HEENT: normal  Neck: no JVD, carotid bruits, or masses Cardiac: RRR; no murmurs, rubs, or gallops,no edema  Respiratory:  clear to auscultation bilaterally, normal work of breathing GI: soft, nontender, nondistended, + BS MS: no deformity or atrophy  Skin: warm and dry, no rash Neuro:  Strength and sensation are intact Psych: euthymic mood, full affect   Recent Labs: 06/23/2016: ALT 21 07/25/2016: B Natriuretic Peptide 68.0 07/27/2016: Magnesium 2.1 07/28/2016: BUN 49; Creatinine, Ser 1.32; Hemoglobin 13.5; Platelets 260; Potassium 3.6; Sodium 136    Lipid Panel Lab Results  Component Value Date   CHOL 163 07/26/2015   HDL 32 (L) 07/26/2015   LDLCALC 94 07/26/2015   TRIG 187 (H) 07/26/2015      Wt Readings from Last 3 Encounters:  10/03/16 218 lb 12 oz (99.2 kg)  09/17/16 220 lb (99.8 kg)  08/29/16 216 lb (98 kg)        ASSESSMENT AND PLAN:  Coronary artery disease involving native coronary artery of native heart without angina pectoris - Plan: EKG 12-Lead Currently with no symptoms of angina. No further workup at this time. Continue current medication regimen.  Essential hypertension Blood pressure is well controlled on today's visit. No changes made to the medications.  Mixed hyperlipidemia On praluent 150 mg, every two weeks  Dramatic improvement in her cholesterol numbers,  Samples provided today as she reports it is under review for new prescription  Tachycardia Denies any sx, no changes to meds.  Chronic diastolic CHF   dry weight 218 pounds Lasix 20-40 mg daily with potassium  Type 2 diabetes mellitus with other circulatory complication, with long-term current use of insulin (HCC) On insulin, weight down   OSA on CPAP Tolerated CPAP   Total encounter time more than 25 minutes  Greater than 50% was spent in counseling and coordination of care with the patient   Disposition:   F/U  6 months   Orders Placed This Encounter  Procedures  . EKG 12-Lead     Signed, Esmond Plants, M.D., Ph.D. 10/03/2016  [place], [place]

## 2016-10-03 ENCOUNTER — Ambulatory Visit (INDEPENDENT_AMBULATORY_CARE_PROVIDER_SITE_OTHER): Payer: Medicare Other | Admitting: Cardiovascular Disease

## 2016-10-03 ENCOUNTER — Encounter: Payer: Self-pay | Admitting: Cardiovascular Disease

## 2016-10-03 VITALS — BP 138/68 | HR 85 | Ht 63.0 in | Wt 218.8 lb

## 2016-10-03 DIAGNOSIS — I25118 Atherosclerotic heart disease of native coronary artery with other forms of angina pectoris: Secondary | ICD-10-CM

## 2016-10-03 DIAGNOSIS — I10 Essential (primary) hypertension: Secondary | ICD-10-CM | POA: Diagnosis not present

## 2016-10-03 DIAGNOSIS — I2510 Atherosclerotic heart disease of native coronary artery without angina pectoris: Secondary | ICD-10-CM

## 2016-10-03 DIAGNOSIS — I209 Angina pectoris, unspecified: Secondary | ICD-10-CM

## 2016-10-03 DIAGNOSIS — I5032 Chronic diastolic (congestive) heart failure: Secondary | ICD-10-CM | POA: Diagnosis not present

## 2016-10-03 DIAGNOSIS — E782 Mixed hyperlipidemia: Secondary | ICD-10-CM

## 2016-10-03 NOTE — Patient Instructions (Signed)

## 2016-10-08 ENCOUNTER — Ambulatory Visit: Payer: Self-pay | Admitting: Orthopedic Surgery

## 2016-10-08 DIAGNOSIS — M818 Other osteoporosis without current pathological fracture: Secondary | ICD-10-CM | POA: Insufficient documentation

## 2016-10-08 NOTE — Patient Instructions (Addendum)
  Your procedure is scheduled on: 10/11/16 Report to Day Surgery. [facility]OND [facility]LO[facility] To find out your arrival time please call [phone] between 1PM - 3PM on 10/10/16  Remember: Instructions that are not followed completely may result in serious medical risk, up to and including death, or upon the discretion of your surgeon and anesthesiologist your surgery may need to be rescheduled.    __X__ 1. Do not eat food or drink liquids after midnight. No gum chewing or hard candies.     ____ 2. No Alcohol for 24 hours before or after surgery.   ____ 3. Do Not Smoke For 24 Hours Prior to Your Surgery.   ____ 4. Bring all medications with you on the day of surgery if instructed.    __X__ 5. Notify your doctor if there is any change in your medical condition     (cold, fever, infections).       Do not wear jewelry, make-up, hairpins, clips or nail polish.  Do not wear lotions, powders, or perfumes. You may wear deodorant.  Do not shave 48 hours prior to surgery. Men may shave face and neck.  Do not bring valuables to the hospital.    [facility] is not responsible for any belongings or valuables.               Contacts, dentures or bridgework may not be worn into surgery.  Leave your suitcase in the car. After surgery it may be brought to your room.  For patients admitted to the hospital, discharge time is determined by your                treatment team.   Patients discharged the day of surgery will not be allowed to drive home.    __X__ Take these medicines the morning of surgery with A SIP OF WATER:    1. GABAPENTIN  2. LEVOTHYROXINE  3. RANEXA  4. REQUIP  5. [place]  6.  ____ Fleet Enema (as directed)   ____ Use CHG Soap as directed  __X__ Use inhalers on the day of surgery  ____ Stop metformin 2 days prior to surgery    X_ Take usual insulin dose the DAY before surgery and none on the morning of surgery.   __X__ Stop Coumadin/Plavix/aspirin on  STOP ASPIRIN AS  INSTRUCTED  __X__ Stop Anti-inflammatories  UNTIL AFTER SURGERY   ____ Stop supplements until after surgery.    _X___ UNABLE TO  Bring C-Pap to the hospital. [place]

## 2016-10-09 ENCOUNTER — Ambulatory Visit: Payer: Self-pay | Admitting: Orthopedic Surgery

## 2016-10-09 ENCOUNTER — Ambulatory Visit
Admission: RE | Admit: 2016-10-09 | Discharge: 2016-10-09 | Disposition: A | Discharge status: Home / Self Care | Payer: Medicare Other | Source: Ambulatory Visit | Attending: Orthopedic Surgery | Admitting: Orthopedic Surgery

## 2016-10-09 DIAGNOSIS — Z01818 Encounter for other preprocedural examination: Secondary | ICD-10-CM | POA: Insufficient documentation

## 2016-10-09 LAB — SURGICAL PCR SCREEN
MRSA, PCR: NEGATIVE
STAPHYLOCOCCUS AUREUS: NEGATIVE

## 2016-10-09 MED ORDER — DEXAMETHASONE SODIUM PHOSPHATE 10 MG/ML IJ SOLN: 10.0000 mg | Freq: Once | INTRAMUSCULAR | Status: AC

## 2016-10-09 NOTE — Pre-Procedure Instructions (Signed)
MEDICAL CLEARANCE ON CHART FROM DR. [place] [facility]K

## 2016-10-10 NOTE — Telephone Encounter (Signed)
Error

## 2016-10-11 ENCOUNTER — Encounter
Admission: RE | Disposition: A | Discharge status: Home / Self Care | Payer: Self-pay | Source: Home / Self Care | Attending: Internal Medicine

## 2016-10-11 ENCOUNTER — Ambulatory Visit: Payer: Medicare Other | Admitting: Anesthesiology

## 2016-10-11 ENCOUNTER — Encounter: Payer: Self-pay | Admitting: *Deleted

## 2016-10-11 ENCOUNTER — Ambulatory Visit: Payer: Medicare Other

## 2016-10-11 ENCOUNTER — Inpatient Hospital Stay
Admission: RE | Admit: 2016-10-11 | Discharge: 2016-10-14 | DRG: 495 | Disposition: A | Discharge status: Home / Self Care | Payer: Medicare Other | Attending: Internal Medicine | Admitting: Internal Medicine

## 2016-10-11 DIAGNOSIS — E669 Obesity, unspecified: Secondary | ICD-10-CM | POA: Diagnosis present

## 2016-10-11 DIAGNOSIS — G4733 Obstructive sleep apnea (adult) (pediatric): Secondary | ICD-10-CM | POA: Diagnosis present

## 2016-10-11 DIAGNOSIS — Z7722 Contact with and (suspected) exposure to environmental tobacco smoke (acute) (chronic): Secondary | ICD-10-CM | POA: Diagnosis present

## 2016-10-11 DIAGNOSIS — J849 Interstitial pulmonary disease, unspecified: Secondary | ICD-10-CM | POA: Diagnosis present

## 2016-10-11 DIAGNOSIS — Z794 Long term (current) use of insulin: Secondary | ICD-10-CM

## 2016-10-11 DIAGNOSIS — E1151 Type 2 diabetes mellitus with diabetic peripheral angiopathy without gangrene: Secondary | ICD-10-CM | POA: Diagnosis present

## 2016-10-11 DIAGNOSIS — I110 Hypertensive heart disease with heart failure: Secondary | ICD-10-CM | POA: Diagnosis present

## 2016-10-11 DIAGNOSIS — F329 Major depressive disorder, single episode, unspecified: Secondary | ICD-10-CM | POA: Diagnosis present

## 2016-10-11 DIAGNOSIS — G8929 Other chronic pain: Secondary | ICD-10-CM | POA: Diagnosis present

## 2016-10-11 DIAGNOSIS — M1991 Primary osteoarthritis, unspecified site: Secondary | ICD-10-CM | POA: Diagnosis present

## 2016-10-11 DIAGNOSIS — S52532D Colles' fracture of left radius, subsequent encounter for closed fracture with routine healing: Secondary | ICD-10-CM | POA: Diagnosis not present

## 2016-10-11 DIAGNOSIS — J9601 Acute respiratory failure with hypoxia: Secondary | ICD-10-CM | POA: Diagnosis not present

## 2016-10-11 DIAGNOSIS — I2510 Atherosclerotic heart disease of native coronary artery without angina pectoris: Secondary | ICD-10-CM | POA: Diagnosis present

## 2016-10-11 DIAGNOSIS — I509 Heart failure, unspecified: Secondary | ICD-10-CM | POA: Diagnosis present

## 2016-10-11 DIAGNOSIS — M109 Gout, unspecified: Secondary | ICD-10-CM | POA: Diagnosis present

## 2016-10-11 DIAGNOSIS — Y838 Other surgical procedures as the cause of abnormal reaction of the patient, or of later complication, without mention of misadventure at the time of the procedure: Secondary | ICD-10-CM | POA: Diagnosis present

## 2016-10-11 DIAGNOSIS — G2581 Restless legs syndrome: Secondary | ICD-10-CM | POA: Diagnosis present

## 2016-10-11 DIAGNOSIS — F419 Anxiety disorder, unspecified: Secondary | ICD-10-CM | POA: Diagnosis present

## 2016-10-11 DIAGNOSIS — J690 Pneumonitis due to inhalation of food and vomit: Secondary | ICD-10-CM | POA: Diagnosis not present

## 2016-10-11 DIAGNOSIS — Z888 Allergy status to other drugs, medicaments and biological substances status: Secondary | ICD-10-CM

## 2016-10-11 DIAGNOSIS — E785 Hyperlipidemia, unspecified: Secondary | ICD-10-CM | POA: Diagnosis present

## 2016-10-11 DIAGNOSIS — K5900 Constipation, unspecified: Secondary | ICD-10-CM | POA: Diagnosis present

## 2016-10-11 DIAGNOSIS — M419 Scoliosis, unspecified: Secondary | ICD-10-CM | POA: Diagnosis present

## 2016-10-11 DIAGNOSIS — W19XXXD Unspecified fall, subsequent encounter: Secondary | ICD-10-CM | POA: Diagnosis present

## 2016-10-11 DIAGNOSIS — Z880 Allergy status to penicillin: Secondary | ICD-10-CM

## 2016-10-11 DIAGNOSIS — Z6838 Body mass index (BMI) 38.0-38.9, adult: Secondary | ICD-10-CM | POA: Diagnosis not present

## 2016-10-11 DIAGNOSIS — K5790 Diverticulosis of intestine, part unspecified, without perforation or abscess without bleeding: Secondary | ICD-10-CM | POA: Diagnosis present

## 2016-10-11 DIAGNOSIS — E039 Hypothyroidism, unspecified: Secondary | ICD-10-CM | POA: Diagnosis present

## 2016-10-11 DIAGNOSIS — R0603 Acute respiratory distress: Secondary | ICD-10-CM

## 2016-10-11 DIAGNOSIS — J441 Chronic obstructive pulmonary disease with (acute) exacerbation: Secondary | ICD-10-CM

## 2016-10-11 DIAGNOSIS — Z885 Allergy status to narcotic agent status: Secondary | ICD-10-CM

## 2016-10-11 DIAGNOSIS — Z79899 Other long term (current) drug therapy: Secondary | ICD-10-CM

## 2016-10-11 DIAGNOSIS — T8484XA Pain due to internal orthopedic prosthetic devices, implants and grafts, initial encounter: Principal | ICD-10-CM | POA: Diagnosis present

## 2016-10-11 DIAGNOSIS — K219 Gastro-esophageal reflux disease without esophagitis: Secondary | ICD-10-CM | POA: Diagnosis present

## 2016-10-11 DIAGNOSIS — Z7982 Long term (current) use of aspirin: Secondary | ICD-10-CM

## 2016-10-11 DIAGNOSIS — J189 Pneumonia, unspecified organism: Secondary | ICD-10-CM

## 2016-10-11 HISTORY — PX: HARDWARE REMOVAL: SHX979

## 2016-10-11 LAB — CBC
HCT: 36.5 % (ref 35.0–47.0)
Hemoglobin: 12.3 g/dL (ref 12.0–16.0)
MCH: 28.9 pg (ref 26.0–34.0)
MCHC: 33.7 g/dL (ref 32.0–36.0)
MCV: 85.6 fL (ref 80.0–100.0)
Platelets: 231 10*3/uL (ref 150–440)
RBC: 4.27 MIL/uL (ref 3.80–5.20)
RDW: 14.6 % — ABNORMAL HIGH (ref 11.5–14.5)
WBC: 11.1 10*3/uL — ABNORMAL HIGH (ref 3.6–11.0)

## 2016-10-11 LAB — CREATININE, SERUM
Creatinine, Ser: 1.25 mg/dL — ABNORMAL HIGH (ref 0.44–1.00)
GFR calc Af Amer: 49 mL/min — ABNORMAL LOW (ref >60)
GFR calc non Af Amer: 42 mL/min — ABNORMAL LOW (ref >60)

## 2016-10-11 LAB — GLUCOSE, CAPILLARY
GLUCOSE-CAPILLARY: 190 mg/dL — AB (ref 65–99)
Glucose-Capillary: 146 mg/dL — ABNORMAL HIGH (ref 65–99)
Glucose-Capillary: 243 mg/dL — ABNORMAL HIGH (ref 65–99)
Glucose-Capillary: 375 mg/dL — ABNORMAL HIGH (ref 65–99)

## 2016-10-11 LAB — TROPONIN I: Troponin I: <0.03 ng/mL (ref <0.03)

## 2016-10-11 SURGERY — REMOVAL, HARDWARE
Anesthesia: General | Site: Arm Lower | Laterality: Left | Wound class: Clean

## 2016-10-11 MED ORDER — METHYLPREDNISOLONE SODIUM SUCC 125 MG IJ SOLR
INTRAMUSCULAR | Status: AC
  Administered 2016-10-11: 125 mg
  Filled 2016-10-11: qty 2

## 2016-10-11 MED ORDER — ROPINIROLE HCL 1 MG PO TABS
5.0000 mg | ORAL_TABLET | Freq: Two times a day (BID) | ORAL | Status: DC
  Administered 2016-10-11 – 2016-10-13 (×5): 5 mg via ORAL
  Filled 2016-10-11 (×6): qty 5

## 2016-10-11 MED ORDER — OXYCODONE HCL 5 MG PO TABS: 5.0000 mg | ORAL_TABLET | Freq: Once | ORAL | Status: DC | PRN

## 2016-10-11 MED ORDER — ROPIVACAINE HCL 5 MG/ML IJ SOLN
INTRAMUSCULAR | Status: AC
  Filled 2016-10-11: qty 30

## 2016-10-11 MED ORDER — ALBUTEROL SULFATE (2.5 MG/3ML) 0.083% IN NEBU
2.5000 mg | INHALATION_SOLUTION | Freq: Four times a day (QID) | RESPIRATORY_TRACT | Status: DC | PRN
  Administered 2016-10-11 – 2016-10-12 (×2): 2.5 mg via RESPIRATORY_TRACT
  Filled 2016-10-11 (×2): qty 3

## 2016-10-11 MED ORDER — ENOXAPARIN SODIUM 40 MG/0.4ML ~~LOC~~ SOLN
40.0000 mg | SUBCUTANEOUS | Status: DC
  Administered 2016-10-11 – 2016-10-13 (×3): 40 mg via SUBCUTANEOUS
  Filled 2016-10-11 (×3): qty 0.4

## 2016-10-11 MED ORDER — ADULT MULTIVITAMIN W/MINERALS CH
1.0000 | ORAL_TABLET | Freq: Every day | ORAL | Status: DC
  Administered 2016-10-12 – 2016-10-13 (×2): 1 via ORAL
  Filled 2016-10-11 (×3): qty 1

## 2016-10-11 MED ORDER — NITROGLYCERIN 0.4 MG SL SUBL
0.4000 mg | SUBLINGUAL_TABLET | SUBLINGUAL | Status: DC | PRN
  Administered 2016-10-11 (×2): 0.4 mg via SUBLINGUAL
  Filled 2016-10-11 (×2): qty 1

## 2016-10-11 MED ORDER — MORPHINE SULFATE (PF) 2 MG/ML IV SOLN: 1.0000 mg | Freq: Four times a day (QID) | INTRAVENOUS | Status: DC | PRN

## 2016-10-11 MED ORDER — LIDOCAINE HCL (PF) 1 % IJ SOLN
INTRAMUSCULAR | Status: AC
  Filled 2016-10-11: qty 5

## 2016-10-11 MED ORDER — FENTANYL CITRATE (PF) 100 MCG/2ML IJ SOLN
INTRAMUSCULAR | Status: AC
  Filled 2016-10-11: qty 2

## 2016-10-11 MED ORDER — ROPINIROLE HCL 1 MG PO TABS: 5.0000 mg | ORAL_TABLET | ORAL | Status: DC

## 2016-10-11 MED ORDER — MORPHINE SULFATE (PF) 2 MG/ML IV SOLN: 1.0000 mg | INTRAVENOUS | Status: DC | PRN

## 2016-10-11 MED ORDER — PHENYLEPHRINE HCL 10 MG/ML IJ SOLN
INTRAMUSCULAR | Status: DC | PRN
  Administered 2016-10-11: 50 ug/min via INTRAVENOUS

## 2016-10-11 MED ORDER — METOCLOPRAMIDE HCL 5 MG/ML IJ SOLN: 5.0000 mg | Freq: Three times a day (TID) | INTRAMUSCULAR | Status: DC | PRN

## 2016-10-11 MED ORDER — TORSEMIDE 20 MG PO TABS: 20.0000 mg | ORAL_TABLET | Freq: Every morning | ORAL | Status: DC

## 2016-10-11 MED ORDER — VASOPRESSIN 20 UNIT/ML IV SOLN
INTRAVENOUS | Status: DC | PRN
  Administered 2016-10-11 (×2): 1 [IU] via INTRAVENOUS

## 2016-10-11 MED ORDER — INSULIN ASPART 100 UNIT/ML ~~LOC~~ SOLN: 0.0000 [IU] | Freq: Three times a day (TID) | SUBCUTANEOUS | Status: DC

## 2016-10-11 MED ORDER — ONDANSETRON HCL 4 MG PO TABS: 4.0000 mg | ORAL_TABLET | Freq: Four times a day (QID) | ORAL | Status: DC | PRN

## 2016-10-11 MED ORDER — GLYCOPYRROLATE 0.2 MG/ML IJ SOLN
INTRAMUSCULAR | Status: DC | PRN
  Administered 2016-10-11: 0.2 mg via INTRAVENOUS

## 2016-10-11 MED ORDER — METHYLPREDNISOLONE SODIUM SUCC 125 MG IJ SOLR: 100.0000 mg | Freq: Once | INTRAMUSCULAR | Status: DC

## 2016-10-11 MED ORDER — METHYLPREDNISOLONE 4 MG PO TBPK: ORAL_TABLET | ORAL | 0 refills | Status: DC

## 2016-10-11 MED ORDER — LEVOTHYROXINE SODIUM 75 MCG PO TABS
75.0000 ug | ORAL_TABLET | Freq: Every day | ORAL | Status: DC
  Administered 2016-10-12 – 2016-10-14 (×3): 75 ug via ORAL
  Filled 2016-10-11: qty 1
  Filled 2016-10-11: qty 3
  Filled 2016-10-11 (×2): qty 1
  Filled 2016-10-11 (×2): qty 3

## 2016-10-11 MED ORDER — POLYETHYLENE GLYCOL 3350 17 G PO PACK
17.0000 g | PACK | Freq: Every day | ORAL | Status: DC | PRN
Start: 2016-10-11 — End: 2016-10-14

## 2016-10-11 MED ORDER — METHYLPREDNISOLONE SODIUM SUCC 125 MG IJ SOLR
60.0000 mg | INTRAMUSCULAR | Status: DC
  Administered 2016-10-12 – 2016-10-14 (×3): 60 mg via INTRAVENOUS
  Filled 2016-10-11 (×3): qty 2

## 2016-10-11 MED ORDER — LIDOCAINE HCL (PF) 2 % IJ SOLN
INTRAMUSCULAR | Status: AC
  Filled 2016-10-11: qty 2

## 2016-10-11 MED ORDER — FENTANYL CITRATE (PF) 100 MCG/2ML IJ SOLN
INTRAMUSCULAR | Status: AC
  Administered 2016-10-11: 50 ug via INTRAVENOUS
  Filled 2016-10-11: qty 2

## 2016-10-11 MED ORDER — DEXAMETHASONE SODIUM PHOSPHATE 10 MG/ML IJ SOLN
INTRAMUSCULAR | Status: AC
  Filled 2016-10-11: qty 1

## 2016-10-11 MED ORDER — CLINDAMYCIN PHOSPHATE 900 MG/50ML IV SOLN
900.0000 mg | INTRAVENOUS | Status: AC
  Administered 2016-10-11: 900 mg via INTRAVENOUS

## 2016-10-11 MED ORDER — ALBUTEROL SULFATE (2.5 MG/3ML) 0.083% IN NEBU
2.5000 mg | INHALATION_SOLUTION | RESPIRATORY_TRACT | Status: DC | PRN
  Administered 2016-10-11 – 2016-10-12 (×2): 2.5 mg via RESPIRATORY_TRACT
  Filled 2016-10-11 (×3): qty 3

## 2016-10-11 MED ORDER — NEOMYCIN-POLYMYXIN B GU 40-200000 IR SOLN
Status: AC
  Filled 2016-10-11: qty 2

## 2016-10-11 MED ORDER — ONDANSETRON HCL 4 MG/2ML IJ SOLN
4.0000 mg | Freq: Four times a day (QID) | INTRAMUSCULAR | Status: DC | PRN
  Filled 2016-10-11: qty 2

## 2016-10-11 MED ORDER — ONDANSETRON HCL 4 MG/2ML IJ SOLN
4.0000 mg | Freq: Once | INTRAMUSCULAR | Status: AC
  Administered 2016-10-11: 4 mg via INTRAVENOUS

## 2016-10-11 MED ORDER — INSULIN ASPART 100 UNIT/ML FLEXPEN: 10.0000 [IU] | PEN_INJECTOR | SUBCUTANEOUS | Status: DC

## 2016-10-11 MED ORDER — VASOPRESSIN 20 UNIT/ML IV SOLN
INTRAVENOUS | Status: AC
  Filled 2016-10-11: qty 1

## 2016-10-11 MED ORDER — INSULIN ASPART 100 UNIT/ML ~~LOC~~ SOLN
0.0000 [IU] | Freq: Three times a day (TID) | SUBCUTANEOUS | Status: DC
  Administered 2016-10-11: 9 [IU] via SUBCUTANEOUS
  Administered 2016-10-12: 3 [IU] via SUBCUTANEOUS
  Administered 2016-10-12: 5 [IU] via SUBCUTANEOUS
  Administered 2016-10-12 (×2): 3 [IU] via SUBCUTANEOUS
  Administered 2016-10-13: 1 [IU] via SUBCUTANEOUS
  Administered 2016-10-13: 7 [IU] via SUBCUTANEOUS
  Administered 2016-10-13: 3 [IU] via SUBCUTANEOUS
  Administered 2016-10-13: 2 [IU] via SUBCUTANEOUS
  Filled 2016-10-11 (×9): qty 1

## 2016-10-11 MED ORDER — FENTANYL CITRATE (PF) 100 MCG/2ML IJ SOLN
50.0000 ug | Freq: Once | INTRAMUSCULAR | Status: AC
  Administered 2016-10-11: 50 ug via INTRAVENOUS

## 2016-10-11 MED ORDER — PROPOFOL 10 MG/ML IV BOLUS
INTRAVENOUS | Status: AC
  Filled 2016-10-11: qty 20

## 2016-10-11 MED ORDER — ROPIVACAINE HCL 5 MG/ML IJ SOLN
INTRAMUSCULAR | Status: DC | PRN
  Administered 2016-10-11: 10 mL via PERINEURAL
  Administered 2016-10-11: 20 mL via PERINEURAL

## 2016-10-11 MED ORDER — CLINDAMYCIN PHOSPHATE 900 MG/50ML IV SOLN
INTRAVENOUS | Status: AC
  Filled 2016-10-11: qty 50

## 2016-10-11 MED ORDER — DOCUSATE SODIUM 100 MG PO CAPS
100.0000 mg | ORAL_CAPSULE | Freq: Two times a day (BID) | ORAL | Status: DC
  Administered 2016-10-11 – 2016-10-13 (×5): 100 mg via ORAL
  Filled 2016-10-11 (×6): qty 1

## 2016-10-11 MED ORDER — PHENYLEPHRINE HCL 10 MG/ML IJ SOLN
INTRAMUSCULAR | Status: DC | PRN
  Administered 2016-10-11: 200 ug via INTRAVENOUS
  Administered 2016-10-11: 100 ug via INTRAVENOUS
  Administered 2016-10-11: 200 ug via INTRAVENOUS

## 2016-10-11 MED ORDER — SODIUM CHLORIDE 0.9 % IV SOLN
INTRAVENOUS | Status: DC
  Administered 2016-10-11: 07:00:00 via INTRAVENOUS

## 2016-10-11 MED ORDER — ALBUTEROL SULFATE (2.5 MG/3ML) 0.083% IN NEBU
INHALATION_SOLUTION | RESPIRATORY_TRACT | Status: AC
  Administered 2016-10-11: 12:00:00
  Filled 2016-10-11: qty 3

## 2016-10-11 MED ORDER — METOPROLOL SUCCINATE ER 25 MG PO TB24
25.0000 mg | ORAL_TABLET | Freq: Every day | ORAL | Status: DC
  Administered 2016-10-13: 25 mg via ORAL
  Filled 2016-10-11 (×2): qty 1

## 2016-10-11 MED ORDER — LISINOPRIL 5 MG PO TABS
5.0000 mg | ORAL_TABLET | Freq: Every day | ORAL | Status: DC
  Filled 2016-10-11: qty 1

## 2016-10-11 MED ORDER — HYOSCYAMINE SULFATE 0.125 MG SL SUBL
0.2500 mg | SUBLINGUAL_TABLET | SUBLINGUAL | Status: DC | PRN
  Filled 2016-10-11: qty 2

## 2016-10-11 MED ORDER — IPRATROPIUM-ALBUTEROL 0.5-2.5 (3) MG/3ML IN SOLN
3.0000 mL | Freq: Once | RESPIRATORY_TRACT | Status: AC
  Administered 2016-10-11: 3 mL via RESPIRATORY_TRACT

## 2016-10-11 MED ORDER — LACTATED RINGERS IV SOLN
INTRAVENOUS | Status: DC
  Administered 2016-10-11: 15:00:00 via INTRAVENOUS

## 2016-10-11 MED ORDER — ACETAMINOPHEN 325 MG PO TABS
ORAL_TABLET | ORAL | Status: AC
  Filled 2016-10-11: qty 3

## 2016-10-11 MED ORDER — ALBUTEROL SULFATE HFA 108 (90 BASE) MCG/ACT IN AERS: 2.0000 | INHALATION_SPRAY | Freq: Four times a day (QID) | RESPIRATORY_TRACT | Status: DC | PRN

## 2016-10-11 MED ORDER — VITAMIN D 1000 UNITS PO TABS
2000.0000 [IU] | ORAL_TABLET | Freq: Every day | ORAL | Status: DC
  Administered 2016-10-12 – 2016-10-13 (×2): 2000 [IU] via ORAL
  Filled 2016-10-11 (×3): qty 2

## 2016-10-11 MED ORDER — POVIDONE-IODINE 10 % EX SWAB: 2.0000 "application " | Freq: Once | CUTANEOUS | Status: DC

## 2016-10-11 MED ORDER — LIDOCAINE HCL (PF) 1 % IJ SOLN
INTRAMUSCULAR | Status: DC | PRN
  Administered 2016-10-11: 1 mL via INTRADERMAL

## 2016-10-11 MED ORDER — OXYCODONE HCL 5 MG PO TABS: 5.0000 mg | ORAL_TABLET | Freq: Four times a day (QID) | ORAL | 0 refills | Status: DC | PRN

## 2016-10-11 MED ORDER — IPRATROPIUM-ALBUTEROL 0.5-2.5 (3) MG/3ML IN SOLN
RESPIRATORY_TRACT | Status: AC
  Filled 2016-10-11: qty 3

## 2016-10-11 MED ORDER — GLYCOPYRROLATE 0.2 MG/ML IJ SOLN
INTRAMUSCULAR | Status: AC
  Filled 2016-10-11: qty 1

## 2016-10-11 MED ORDER — MIDAZOLAM HCL 2 MG/2ML IJ SOLN
INTRAMUSCULAR | Status: AC
  Administered 2016-10-11: 1 mg
  Filled 2016-10-11: qty 2

## 2016-10-11 MED ORDER — BACITRACIN 50000 UNITS IM SOLR
INTRAMUSCULAR | Status: DC | PRN
  Administered 2016-10-11: 200 mL

## 2016-10-11 MED ORDER — BUPIVACAINE HCL (PF) 0.5 % IJ SOLN
INTRAMUSCULAR | Status: AC
  Filled 2016-10-11: qty 30

## 2016-10-11 MED ORDER — PANTOPRAZOLE SODIUM 40 MG PO TBEC
40.0000 mg | DELAYED_RELEASE_TABLET | Freq: Two times a day (BID) | ORAL | Status: DC
  Administered 2016-10-11 – 2016-10-13 (×5): 40 mg via ORAL
  Filled 2016-10-11 (×6): qty 1

## 2016-10-11 MED ORDER — OXYCODONE HCL 5 MG/5ML PO SOLN: 5.0000 mg | Freq: Once | ORAL | Status: DC | PRN

## 2016-10-11 MED ORDER — INSULIN GLARGINE 100 UNIT/ML ~~LOC~~ SOLN
32.0000 [IU] | SUBCUTANEOUS | Status: DC
  Administered 2016-10-11 – 2016-10-13 (×3): 32 [IU] via SUBCUTANEOUS
  Filled 2016-10-11 (×4): qty 0.32

## 2016-10-11 MED ORDER — OXYCODONE-ACETAMINOPHEN 5-325 MG PO TABS
1.0000 | ORAL_TABLET | Freq: Four times a day (QID) | ORAL | Status: DC | PRN
  Administered 2016-10-12: 1 via ORAL
  Filled 2016-10-11: qty 1

## 2016-10-11 MED ORDER — PHENYLEPHRINE HCL 10 MG/ML IJ SOLN
INTRAMUSCULAR | Status: AC
  Filled 2016-10-11: qty 1

## 2016-10-11 MED ORDER — METOCLOPRAMIDE HCL 10 MG PO TABS: 5.0000 mg | ORAL_TABLET | Freq: Three times a day (TID) | ORAL | Status: DC | PRN

## 2016-10-11 MED ORDER — PROPOFOL 10 MG/ML IV BOLUS
INTRAVENOUS | Status: DC | PRN
  Administered 2016-10-11: 140 mg via INTRAVENOUS

## 2016-10-11 MED ORDER — ACETAMINOPHEN 500 MG PO TABS
1000.0000 mg | ORAL_TABLET | Freq: Once | ORAL | Status: AC
  Administered 2016-10-11: 975 mg via ORAL

## 2016-10-11 MED ORDER — LIDOCAINE HCL (CARDIAC) 20 MG/ML IV SOLN
INTRAVENOUS | Status: DC | PRN
  Administered 2016-10-11: 50 mg via INTRAVENOUS

## 2016-10-11 MED ORDER — OXYCODONE HCL 5 MG PO TABS
5.0000 mg | ORAL_TABLET | ORAL | Status: DC | PRN
  Administered 2016-10-11: 5 mg via ORAL
  Administered 2016-10-12: 10 mg via ORAL
  Filled 2016-10-11: qty 2
  Filled 2016-10-11: qty 1
  Filled 2016-10-11: qty 2

## 2016-10-11 MED ORDER — GABAPENTIN 300 MG PO CAPS
300.0000 mg | ORAL_CAPSULE | Freq: Three times a day (TID) | ORAL | Status: DC
  Administered 2016-10-11 – 2016-10-13 (×7): 300 mg via ORAL
  Filled 2016-10-11 (×8): qty 1

## 2016-10-11 MED ORDER — RANOLAZINE ER 500 MG PO TB12
500.0000 mg | ORAL_TABLET | Freq: Two times a day (BID) | ORAL | Status: DC | PRN
  Administered 2016-10-11: 500 mg via ORAL
  Filled 2016-10-11 (×2): qty 1

## 2016-10-11 MED ORDER — FENTANYL CITRATE (PF) 100 MCG/2ML IJ SOLN: 25.0000 ug | INTRAMUSCULAR | Status: DC | PRN

## 2016-10-11 MED ORDER — INSULIN ASPART 100 UNIT/ML ~~LOC~~ SOLN
0.0000 [IU] | Freq: Three times a day (TID) | SUBCUTANEOUS | Status: DC
  Administered 2016-10-11: 3 [IU] via SUBCUTANEOUS
  Filled 2016-10-11: qty 1

## 2016-10-11 MED ORDER — ONDANSETRON HCL 4 MG/2ML IJ SOLN
INTRAMUSCULAR | Status: AC
  Filled 2016-10-11: qty 2

## 2016-10-11 MED ORDER — ONDANSETRON HCL 4 MG/2ML IJ SOLN
INTRAMUSCULAR | Status: DC | PRN
  Administered 2016-10-11: 4 mg via INTRAVENOUS

## 2016-10-11 MED ORDER — CHLORHEXIDINE GLUCONATE 4 % EX LIQD
60.0000 mL | Freq: Once | CUTANEOUS | Status: AC
  Administered 2016-10-11: 4 via TOPICAL

## 2016-10-11 MED ORDER — TEMAZEPAM 15 MG PO CAPS
30.0000 mg | ORAL_CAPSULE | Freq: Every day | ORAL | Status: DC
  Administered 2016-10-11 – 2016-10-13 (×3): 30 mg via ORAL
  Filled 2016-10-11 (×3): qty 2

## 2016-10-11 MED ORDER — BACITRACIN 50000 UNITS IM SOLR
INTRAMUSCULAR | Status: AC
  Filled 2016-10-11: qty 1

## 2016-10-11 MED ORDER — ASPIRIN EC 81 MG PO TBEC
81.0000 mg | DELAYED_RELEASE_TABLET | Freq: Every day | ORAL | Status: DC
  Administered 2016-10-11 – 2016-10-13 (×3): 81 mg via ORAL
  Filled 2016-10-11 (×3): qty 1

## 2016-10-11 MED ORDER — TRAMADOL HCL 50 MG PO TABS
50.0000 mg | ORAL_TABLET | Freq: Three times a day (TID) | ORAL | Status: DC | PRN
  Administered 2016-10-12: 50 mg via ORAL
  Filled 2016-10-11: qty 1

## 2016-10-11 MED ORDER — ONDANSETRON HCL 4 MG/2ML IJ SOLN
4.0000 mg | Freq: Four times a day (QID) | INTRAMUSCULAR | Status: DC | PRN
  Administered 2016-10-11: 4 mg via INTRAVENOUS

## 2016-10-11 MED ORDER — DEXAMETHASONE SODIUM PHOSPHATE 10 MG/ML IJ SOLN
INTRAMUSCULAR | Status: DC | PRN
  Administered 2016-10-11: 10 mg via INTRAVENOUS

## 2016-10-11 SURGICAL SUPPLY — 32 items
BANDAGE ACE 3X5.8 VEL STRL LF (GAUZE/BANDAGES/DRESSINGS) ×2 IMPLANT
BNDG ESMARK 4X12 TAN STRL LF (GAUZE/BANDAGES/DRESSINGS) ×2 IMPLANT
CANISTER SUCT 1200ML W/VALVE (MISCELLANEOUS) ×2 IMPLANT
CHLORAPREP W/TINT 26ML (MISCELLANEOUS) ×2 IMPLANT
CUFF TOURN 18 STER (MISCELLANEOUS) ×2 IMPLANT
CUFF TOURN 24 STER (MISCELLANEOUS) IMPLANT
DRAPE FLUOR MINI C-ARM 54X84 (DRAPES) ×2 IMPLANT
ELECT REM PT RETURN 9FT ADLT (ELECTROSURGICAL) ×2
ELECTRODE REM PT RTRN 9FT ADLT (ELECTROSURGICAL) ×1 IMPLANT
GAUZE PETRO XEROFOAM 1X8 (MISCELLANEOUS) ×4 IMPLANT
GAUZE SPONGE 4X4 12PLY STRL (GAUZE/BANDAGES/DRESSINGS) ×2 IMPLANT
GAUZE XEROFORM 4X4 STRL (GAUZE/BANDAGES/DRESSINGS) ×2 IMPLANT
GLOVE SURG ORTHO 8.0 STRL STRW (GLOVE) ×4 IMPLANT
GOWN STRL REUS W/ TWL XL LVL3 (GOWN DISPOSABLE) ×3 IMPLANT
GOWN STRL REUS W/TWL LRG LVL4 (GOWN DISPOSABLE) IMPLANT
GOWN STRL REUS W/TWL XL LVL3 (GOWN DISPOSABLE) ×6
KIT RM TURNOVER STRD PROC AR (KITS) ×2 IMPLANT
NS IRRIG 500ML POUR BTL (IV SOLUTION) ×2 IMPLANT
PACK EXTREMITY ARMC (MISCELLANEOUS) ×2 IMPLANT
PADDING CAST 4IN STRL (MISCELLANEOUS) ×2
PADDING CAST BLEND 4X4 STRL (MISCELLANEOUS) ×2 IMPLANT
SOL PREP PVP 2OZ (MISCELLANEOUS)
SOLUTION PREP PVP 2OZ (MISCELLANEOUS) IMPLANT
SPLINT CAST 1 STEP 3X12 (MISCELLANEOUS) ×2 IMPLANT
STOCKINETTE BIAS CUT 4 980044 (GAUZE/BANDAGES/DRESSINGS) ×2 IMPLANT
STOCKINETTE BIAS CUT 6 980064 (GAUZE/BANDAGES/DRESSINGS) IMPLANT
STOCKINETTE STRL 6IN 960660 (GAUZE/BANDAGES/DRESSINGS) ×2 IMPLANT
SUT ETHILON 4-0 (SUTURE) ×1
SUT ETHILON 4-0 FS2 18XMFL BLK (SUTURE) ×1
SUT VIC AB 3-0 SH 27 (SUTURE) ×2
SUT VIC AB 3-0 SH 27X BRD (SUTURE) ×1 IMPLANT
SUTURE ETHLN 4-0 FS2 18XMF BLK (SUTURE) ×1 IMPLANT

## 2016-10-11 NOTE — Anesthesia Procedure Notes (Addendum)
Anesthesia Regional Block: Supraclavicular block   Pre-Anesthetic Checklist: ,, timeout performed, Correct Patient, Correct Site, Correct Laterality, Correct Procedure, Correct Position, site marked, Risks and benefits discussed,  Surgical consent,  Pre-op evaluation,  At surgeon's request and post-op pain management  Laterality: Upper and Left  Prep: chloraprep       Needles:  Injection technique: Single-shot  Needle Type: Stimiplex     Needle Length: 5cm  Needle Gauge: 22     Additional Needles:   Procedures: ultrasound guided,,,,,,,,  Narrative:  Start time: 10/11/2016 7:28 AM End time: 10/11/2016 7:32 AM Injection made incrementally with aspirations every 5 mL.  Performed by: Personally  Anesthesiologist: Katy Fitch K  Additional Notes: Patient endorses baseline weakness, numbness and pain in operative hand(left) and arm preoperatively.  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 Patient consented that she is theoretically at increased risk for nerve injury due to her pre existing nerve symptoms in her arm and hand from the nerve block.  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  Patient voiced understanding.  Functioning IV was confirmed and monitors were applied.  A 7mm 22ga Stimuplex needle was used. Sterile prep,hand hygiene and sterile gloves were used.  Minimal sedation used for procedure.  No paresthesia endorsed by patient during the procedure.  Negative aspiration and negative test dose prior to incremental administration of local anesthetic. The patient tolerated the procedure well with no immediate complications.

## 2016-10-11 NOTE — Anesthesia Preprocedure Evaluation (Addendum)
Anesthesia Evaluation  Patient identified by MRN, date of birth, ID band Patient awake    Reviewed: Allergy & Precautions, H&P , NPO status , Patient's Chart, lab work & pertinent test results  Airway Mallampati: III  TM Distance: <3 FB Neck ROM: limited    Dental  (+) Poor Dentition, Chipped   Pulmonary neg pulmonary ROS, shortness of breath and with exertion, sleep apnea ,           Cardiovascular hypertension, (-) angina+ CAD, + Peripheral Vascular Disease and +CHF  negative cardio ROS       Neuro/Psych  Headaches, PSYCHIATRIC DISORDERS Anxiety Depression negative neurological ROS  negative psych ROS   GI/Hepatic negative GI ROS, Neg liver ROS, hiatal hernia, GERD  Medicated and Controlled,  Endo/Other  negative endocrine ROSdiabetesHypothyroidism   Renal/GU Renal disease     Musculoskeletal  (+) Arthritis ,   Abdominal   Peds  Hematology negative hematology ROS (+)   Anesthesia Other Findings Patient endorses baseline weakness, numbness and pain in operative hand(left) and arm preoperatively.  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  Past Medical History: No date: Anginal pain ([place]) No date: Anxiety No date: Arthritis No date: CHF (congestive heart failure) (HCC) No date: Chronic back pain     Comment:  stenosis.degenerative disc,some scoliosis No date: Constipation     Comment:  takes Stool Softener daily No date: Coronary artery disease No date: Depression     Comment:  takes Cymbalta daily No date: Diabetes mellitus     Comment:  Type 2 diabetic. Average fasting blood sugar runs high               170-200 No date: Diverticulosis No date: E coli infection No date: GERD (gastroesophageal reflux disease)     Comment:  takes Nexium daily No date: Headache 02/16/2016: Headache No date: Hemorrhoids No date: History of colon polyps     Comment:  benign No date: History of gout      Comment:  doesn't take any meds No date: History of hiatal hernia No date: History of vertigo     Comment:  doesn't take any meds No date: Hyperlipidemia     Comment:  takes Praluent daily No date: Hypertension     Comment:  currently BP medications are on hold  No date: Hypothyroidism     Comment:  takes Synthroid daily No date: Insomnia     Comment:  takes Restoril nightly No date: Joint pain No date: Joint swelling No date: Muscle spasm     Comment:  takes Robaxin as needed No date: NSVT (nonsustained ventricular tachycardia) (HCC) No date: OSA on CPAP No date: Periodic heart flutter No date: Peripheral edema     Comment:  takes LAsix as needed No date: Peripheral vascular disease ([place])     Comment:  AAA as stated per pt / was just discovered and pt states              has not been referred to vascular MD  No date: Restless leg     Comment:  takes Requip daily No date: Rosacea No date: Sleep apnea No date: Urinary incontinence No date: Urinary urgency No date: Varicose veins No date: Weakness     Comment:  numbness and tingling.  Past Surgical History: No date: ABDOMINAL HYSTERECTOMY     Comment:  with BSo 2013: CARDIAC CATHETERIZATION     Comment:  Normal No date: CARPAL TUNNEL RELEASE; Bilateral  No date: CATARACT EXTRACTION, BILATERAL No date: CHOLECYSTECTOMY No date: COLONOSCOPY No date: DIAGNOSTIC LAPAROSCOPY     Comment:  multiple times No date: DILATION AND CURETTAGE OF UTERUS 11/01/2014: ESOPHAGOGASTRODUODENOSCOPY (EGD) WITH PROPOFOL; N/A     Comment:  Procedure: ESOPHAGOGASTRODUODENOSCOPY (EGD) WITH               PROPOFOL;  Surgeon: Hulen Luster, MD;  Location: [facility]               ENDOSCOPY;  Service: Gastroenterology;  Laterality: N/A; No date: IMPLANTABLE CONTACT LENS IMPLANTATION     Comment:  bilateral 11/13/2015: LAMINECTOMY 11/2015: LUMBAR FUSION 12/02/2015: LUMBAR WOUND DEBRIDEMENT; N/A     Comment:  Procedure: WOUND Exploration;  Surgeon: Consuella Lose, MD;  Location[place];  Service: Neurosurgery;               Laterality: N/A; 08/29/2016: OPEN REDUCTION INTERNAL FIXATION (ORIF) DISTAL RADIAL  FRACTURE; Left     Comment:  Procedure: OPEN REDUCTION INTERNAL FIXATION (ORIF)               DISTAL RADIAL FRACTURE;  Surgeon: Lovell Sheehan, MD;                Location: [facility] ORS;  Service: Orthopedics;  Laterality:               Left; No date: PICC LINE PLACE PERIPHERAL ([place] HX)     Comment:  right upper arm  No date: ROTATOR CUFF REPAIR; Left 11/01/2014: SAVORY DILATION; N/A     Comment:  Procedure: SAVORY DILATION;  Surgeon: Hulen Luster, MD;                Location: [facility] ENDOSCOPY;  Service: Gastroenterology;                Laterality: N/A; No date: TONSILLECTOMY No date: TRIGGER FINGER RELEASE; Bilateral  BMI    Body Mass Index:  38.79 kg/m      Reproductive/Obstetrics negative OB ROS                           Anesthesia Physical Anesthesia Plan  ASA: III  Anesthesia Plan: General LMA   Post-op Pain Management:  Regional for Post-op pain   Induction: Intravenous  PONV Risk Score and Plan:   Airway Management Planned: LMA  Additional Equipment:   Intra-op Plan:   Post-operative Plan: Extubation in OR  Informed Consent: I have reviewed the patients History and Physical, chart, labs and discussed the procedure including the risks, benefits and alternatives for the proposed anesthesia with the patient or authorized representative who has indicated his/her understanding and acceptance.   Dental Advisory Given  Plan Discussed with: Anesthesiologist, CRNA and Surgeon  Anesthesia Plan Comments: (Patient consented for risks of anesthesia including but not limited to:  - adverse reactions to medications - damage to teeth, lips or other oral mucosa - sore throat or hoarseness - Damage to heart, brain, lungs or loss of life  Patient voiced understanding.  Patient consented  that she is theoretically at increased risk for nerve injury due to her pre existing nerve symptoms in her arm and hand from the nerve block.  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  Patient voiced  understanding.)     Anesthesia Quick Evaluation

## 2016-10-11 NOTE — Addendum Note (Signed)
Addendum  created 10/11/16 1351 by Josua Ferrebee, Precious Haws, MD   Anesthesia Event edited

## 2016-10-11 NOTE — H&P (Signed)
[facility] NAME: [name]    MR#:  [id number]  DATE OF BIRTH:  1945/07/21  DATE OF ADMISSION:  10/11/2016  PRIMARY CARE PHYSICIAN: Rusty Aus, MD   REQUESTING/REFERRING PHYSICIAN: Dr Amie Critchley  CHIEF COMPLAINT:   Increasing shortness of breath and hypoxia. HISTORY OF PRESENT ILLNESS:  [name]  is a 71 y.o. female with a known history of CAD, obesity, diabetes, hyperlipidemia, obstructive sleep apnea on CPAP with questionable history of asthma/COPD admitted from PACU after patient was found to have increasing respiratory distress with hypoxia after surgery. Patient had sustained a left distal radial fracture status post fall in July 2018 and had open reduction and internal fixation of distal radial fracture with hardware that was removed today by Dr. Harlow Mares from orthopedic. Post extubation patient double up vomiting spitting up some bile and started having cough with increasing respiratory distress course respiration and hypoxia requiring [name] mask which was weaned down to nasal cannula oxygen when she came on the floor. Patient's sats were 95% on 2-3 L nasal cannula. She was significantly wheezing and admitted with acute respiratory distress.  PAST MEDICAL HISTORY:   Past Medical History:  Diagnosis Date  . Anginal pain ([place])   . Anxiety   . Arthritis   . CHF (congestive heart failure) ([place])   . Chronic back pain    stenosis.degenerative disc,some scoliosis  . Constipation    takes Stool Softener daily  . Coronary artery disease   . Depression    takes Cymbalta daily  . Diabetes mellitus    Type 2 diabetic. Average fasting blood sugar runs high 170-200  . Diverticulosis   . E coli infection   . GERD (gastroesophageal reflux disease)    takes Nexium daily  . Headache   . Headache 02/16/2016  . Hemorrhoids   . History of colon polyps    benign  . History of gout    doesn't take any meds  . History  of hiatal hernia   . History of vertigo    doesn't take any meds  . Hyperlipidemia    takes Praluent daily  . Hypertension    currently BP medications are on hold   . Hypothyroidism    takes Synthroid daily  . Insomnia    takes Restoril nightly  . Joint pain   . Joint swelling   . Muscle spasm    takes Robaxin as needed  . NSVT (nonsustained ventricular tachycardia) ([place])   . OSA on CPAP   . Periodic heart flutter   . Peripheral edema    takes LAsix as needed  . Peripheral vascular disease ([place])    AAA as stated per pt / was just discovered and pt states has not been referred to vascular MD   . Restless leg    takes Requip daily  . Rosacea   . Sleep apnea   . Urinary incontinence   . Urinary urgency   . Varicose veins   . Weakness    numbness and tingling.    PAST SURGICAL HISTOIRY:   Past Surgical History:  Procedure Laterality Date  . ABDOMINAL HYSTERECTOMY     with BSo  . CARDIAC CATHETERIZATION  2013   Normal  . CARPAL TUNNEL RELEASE Bilateral   . CATARACT EXTRACTION, BILATERAL    . CHOLECYSTECTOMY    . COLONOSCOPY    . DIAGNOSTIC LAPAROSCOPY     multiple times  . DILATION AND CURETTAGE  OF UTERUS    . ESOPHAGOGASTRODUODENOSCOPY (EGD) WITH PROPOFOL N/A 11/01/2014   Procedure: ESOPHAGOGASTRODUODENOSCOPY (EGD) WITH PROPOFOL;  Surgeon: Hulen Luster, MD;  Location: [facility] ENDOSCOPY;  Service: Gastroenterology;  Laterality: N/A;  . IMPLANTABLE CONTACT LENS IMPLANTATION     bilateral  . LAMINECTOMY  11/13/2015  . LUMBAR FUSION  11/2015  . LUMBAR WOUND DEBRIDEMENT N/A 12/02/2015   Procedure: WOUND Exploration;  Surgeon: Consuella Lose, MD;  Location: [place];  Service: Neurosurgery;  Laterality: N/A;  . OPEN REDUCTION INTERNAL FIXATION (ORIF) DISTAL RADIAL FRACTURE Left 08/29/2016   Procedure: OPEN REDUCTION INTERNAL FIXATION (ORIF) DISTAL RADIAL FRACTURE;  Surgeon: Lovell Sheehan, MD;  Location: [facility] ORS;  Service: Orthopedics;  Laterality: Left;  . PICC LINE PLACE  PERIPHERAL ([employer] HX)     right upper arm   . ROTATOR CUFF REPAIR Left   . SAVORY DILATION N/A 11/01/2014   Procedure: SAVORY DILATION;  Surgeon: Hulen Luster, MD;  Location: [facility] ENDOSCOPY;  Service: Gastroenterology;  Laterality: N/A;  . TONSILLECTOMY    . TRIGGER FINGER RELEASE Bilateral     SOCIAL HISTORY:   Social History  Substance Use Topics  . Smoking status: Never Smoker  . Smokeless tobacco: Never Used  . Alcohol use No    FAMILY HISTORY:   Family History  Problem Relation Age of Onset  . Heart disease Mother   . Breast cancer Mother 50  . Heart disease Father   . Heart attack Sister   . Heart disease Sister   . Heart disease Brother     DRUG ALLERGIES:   Allergies  Allergen Reactions  . Ceftin [Cefuroxime Axetil] Diarrhea  . Codeine Rash       . Penicillins Rash    Has patient had a PCN reaction causing immediate rash, facial/tongue/throat swelling, SOB or lightheadedness with hypotension: NO Has patient had a PCN reaction causing severe rash involving mucus membranes or skin necrosis: NO Has patient had a PCN retioion that required hospitalization NO Has patient had a PCN reaction occurring within the last 10 years: NO If all of the above answers are "NO", then may proceed with Cephalosporin use.  . Vicodin [Hydrocodone-Acetaminophen] Other (See Comments)    passes out    REVIEW OF SYSTEMS:  Review of Systems  Constitutional: Negative for chills, fever and weight loss.  HENT: Negative for ear discharge, ear pain and nosebleeds.   Eyes: Negative for blurred vision, pain and discharge.  Respiratory: Positive for cough, shortness of breath and wheezing. Negative for sputum production and stridor.   Cardiovascular: Negative for chest pain, palpitations, orthopnea and PND.  Gastrointestinal: Negative for abdominal pain, diarrhea, nausea and vomiting.  Genitourinary: Negative for frequency and urgency.  Musculoskeletal: Positive for joint pain. Negative for  back pain.  Neurological: Positive for weakness. Negative for sensory change, speech change and focal weakness.  Psychiatric/Behavioral: Negative for depression and hallucinations. The patient is not nervous/anxious.      MEDICATIONS AT HOME:   Prior to Admission medications   Medication Sig Start Date End Date Taking? Authorizing Provider  albuterol (PROVENTIL HFA;VENTOLIN HFA) 108 (90 Base) MCG/ACT inhaler Inhale 2 puffs into the lungs every 6 (six) hours as needed for wheezing or shortness of breath. 07/28/16  Yes Sudini, Alveta Heimlich, MD  Alirocumab (PRALUENT) 150 MG/ML SOPN Inject 1 pen into the skin every 14 (fourteen) days. 01/12/16  Yes Minna Merritts, MD  aspirin 81 MG tablet Take 81 mg by mouth at bedtime.    Yes  [provider]  Azelastine HCl (ASTEPRO) 0.15 % SOLN Place 2 sprays into both nostrils at bedtime.    Yes [provider]  Cholecalciferol (VITAMIN D) 2000 units tablet Take 2,000 Units by mouth daily.   Yes [provider]  desonide (DESOWEN) 0.05 % lotion Apply 1 application topically daily.   Yes [provider]  esomeprazole (NEXIUM) 40 MG capsule Take 40 mg by mouth 3 (three) times daily before meals.    Yes [provider]  etodolac (LODINE) 400 MG tablet Take 400 mg by mouth 2 (two) times daily.   Yes [provider]  gabapentin (NEURONTIN) 300 MG capsule Take 300 mg by mouth 3 (three) times daily.    Yes [provider]  hyoscyamine (LEVSIN SL) 0.125 MG SL tablet Place 2 tablets (0.25 mg total) under the tongue every 4 (four) hours as needed for cramping. 06/25/16  Yes Theodoro Grist, MD  insulin aspart (NOVOLOG) 100 UNIT/ML FlexPen Inject 10-16 Units into the skin See admin instructions. 10 units with breakfast, 10 units with lunch, 16 units at dinner, >200 increase by 2 units. 03/01/15 07/23/17 Yes [provider]  insulin glargine (LANTUS) 100 UNIT/ML injection Inject 32 Units into the skin daily.     Yes [provider]  levothyroxine (SYNTHROID, LEVOTHROID) 75 MCG tablet Take 75 mcg by mouth daily before breakfast.  11/24/12  Yes [provider]  lisinopril (PRINIVIL,ZESTRIL) 5 MG tablet Take 1 tablet (5 mg total) by mouth daily. Patient taking differently: Take 5 mg by mouth at bedtime.  07/28/16  Yes Sudini, Alveta Heimlich, MD  metoprolol succinate (TOPROL-XL) 25 MG 24 hr tablet Take 25 mg by mouth at bedtime.   Yes [provider]  Multiple Vitamin (MULTIVITAMIN) tablet Take 1 tablet by mouth daily.   Yes [provider]  nitroGLYCERIN (NITROSTAT) 0.4 MG SL tablet Place 1 tablet (0.4 mg total) under the tongue every 5 (five) minutes as needed for chest pain. 03/06/16 09/18/17 Yes Gollan, Kathlene November, MD  ondansetron (ZOFRAN) 4 MG tablet Take 1 tablet (4 mg total) by mouth every 6 (six) hours as needed for nausea. 06/25/16  Yes Theodoro Grist, MD  potassium chloride SA (K-DUR,KLOR-CON) 20 MEQ tablet Take 2 tablets (40 mEq total) by mouth daily. Patient taking differently: Take 20-40 mEq by mouth See admin instructions. Takes 58meq for every 20mg  of torsemide. 08/10/16 10/09/16 Yes Alisa Graff, FNP  ranolazine (RANEXA) 500 MG 12 hr tablet Take 1 tablet (500 mg total) by mouth 2 (two) times daily. Patient taking differently: Take 500 mg by mouth 2 (two) times daily as needed (angina).  08/17/16  Yes Gollan, Kathlene November, MD  ropinirole (REQUIP) 5 MG tablet Take 5 mg by mouth See admin instructions. Takes 5mg  twice a day. Takes 5mg  midday as needed for restless legs.   Yes [provider]  tapentadol (NUCYNTA) 50 MG tablet Take 50 mg by mouth 3 (three) times daily as needed for moderate pain.    Yes [provider]  temazepam (RESTORIL) 30 MG capsule Take 30 mg by mouth at bedtime.   Yes [provider]  torsemide (DEMADEX) 20 MG tablet Take 2 tablets (40 mg total) by mouth daily. Patient taking differently: Take 20-40 mg by mouth See admin  instructions. Takes 20mg  every AM. Takes an additional 20mg  in the AM as needed for fluid/weight gain. 08/10/16  Yes Hackney, Otila Kluver A, FNP  traMADol (ULTRAM) 50 MG tablet Take 50 mg by mouth 3 (three)  times daily as needed for moderate pain.    Yes [provider]  methylPREDNISolone (MEDROL) 4 MG TBPK tablet Take by mouth as directed. Medrol Dosepak to take as directed 10/11/16   Lovell Sheehan, MD  oxyCODONE (ROXICODONE) 5 MG immediate release tablet Take 1 tablet (5 mg total) by mouth every 6 (six) hours as needed. 10/11/16 10/11/17  Lovell Sheehan, MD      VITAL SIGNS:  Blood pressure (!) 89/54, pulse (!) 113, temperature 98.6 F (37 C), temperature source Oral, resp. rate 16, height 5\' 3"  (1.6 m), weight 99.3 kg (219 lb), SpO2 99 %.  PHYSICAL EXAMINATION:  GENERAL:  71 y.o.-year-old patient lying in the bed with no acute distress. obese EYES: Pupils equal, round, reactive to light and accommodation. No scleral icterus. Extraocular muscles intact.  HEENT: Head atraumatic, normocephalic. Oropharynx and nasopharynx clear.  NECK:  Supple, no jugular venous distention. No thyroid enlargement, no tenderness.  LUNGS: course breath sounds bilaterally, bilateralwheezing,no rales,rhonchi or crepitation. No use of accessory muscles of respiration.  CARDIOVASCULAR: S1, S2 normal. No murmurs, rubs, or gallops.  ABDOMEN: Soft, nontender, nondistended. Bowel sounds present. No organomegaly or mass.  EXTREMITIES: No pedal edema, cyanosis, or clubbing.  NEUROLOGIC: Cranial nerves II through XII are intact. Muscle strength 5/5 in all extremities. Sensation intact. Gait not checked.  PSYCHIATRIC: The patient is alert and oriented x 3.  SKIN: No obvious rash, lesion, or ulcer.   LABORATORY PANEL:   CBC  Recent Labs Lab 10/11/16 1411  WBC 11.1*  HGB 12.3  HCT 36.5  PLT 231    ------------------------------------------------------------------------------------------------------------------  Chemistries   Recent Labs Lab 10/11/16 1411  CREATININE 1.25*   ------------------------------------------------------------------------------------------------------------------  Cardiac Enzymes No results for input(s): TROPONINI in the last 168 hours. ------------------------------------------------------------------------------------------------------------------  RADIOLOGY:  Dg Chest 1 View  Result Date: 10/11/2016 CLINICAL DATA:  Respiratory distress. EXAM: CHEST 1 VIEW COMPARISON:  07/27/2016. FINDINGS: Normal sized heart. Increased bilateral hilar and perihilar density with linear densities in the left mid and lower lung zone. The interstitial markings remain mildly prominent. Unremarkable bones. IMPRESSION: 1. Interval probable atelectasis in the hilar and perihilar regions bilaterally. 2. Interval linear atelectasis in the left mid and lower lung zone. 3. Stable mild chronic interstitial lung disease. Electronically Signed   By: Claudie Revering Castaneda.   On: 10/11/2016 10:35    EKG:    IMPRESSION AND PLAN:   [name]  is a 71 y.o. female with a known history of CAD, obesity, diabetes, hyperlipidemia, obstructive sleep apnea on CPAP with questionable history of asthma/COPD admitted from PACU after patient was found to have increasing respiratory distress with hypoxia after surgery.  1. Acute hypoxic respiratory distress secondary to possible aspirationPneumonitis versus asthma/COPD flare -patient reports some secondhand smoke exposure. She does not have a formal diagnosis of COPD/asthma  -Patient currently is wheezing I will start her on IV Solu-Medrol, nebulizer, incentive spirometer, inhalers -Her chest x-rays consistent with chronic interstitial lung disease -Pulmonary function test as outpatient -Wean oxygen as tolerated -Will repeat chest x-ray in the  morning  2. Status post removal of hardware of left wrist that was placed secondary to left distal radial fracture -When necessary pain meds  3. Obstructive sleep apnea on CPAP  4.diabetes continue insulin Lantus and sliding scale  5. Hypothyroidism continue Synthroid  6. Hypertension continue lisinopril  7. DVT prophylaxis subcutaneous Lovenox  Discussed with patient and son All the records are reviewed and case discussed with ED provider. Management plans  discussed with the patient, family and they are in agreement.  CODE STATUS: full  TOTAL TIME TAKING CARE OF THIS PATIENT: *50* minutes.    [name] on 10/11/2016 at 3:38 PM  Between 7am to 6pm - Pager - [phone]  After 6pm go to [email or web address] - password [email or web address] [facility]  [facility]  Office  [phone]  CC: Primary care physician; Rusty Aus, MD

## 2016-10-11 NOTE — H&P (Signed)
The patient has been re-examined, and the chart reviewed, and there have been no interval changes to the documented history and physical.  Plan a left wrist hardware removal today.  Anesthesia is consulted regarding a peripheral nerve block for post-operative pain.  The risks, benefits, and alternatives have been discussed at length, and the patient is willing to proceed.

## 2016-10-11 NOTE — Progress Notes (Signed)
Duo neb given dr piscitello in to see pt  Aware of effort of breathing and course rales

## 2016-10-11 NOTE — Progress Notes (Signed)
Patient continues to struggle with breathing. Dr Amie Critchley at bedside. Dr Harlow Mares and Hospitalist notified by Dr Amie Critchley. Hospitalist will see patient for admission.. No further orders at this time

## 2016-10-11 NOTE — Progress Notes (Signed)
Pt complains of constant sharp chest pain on left side, worse when sitting up. MD made aware. New orders for EKG and troponin. Pt verbalizes pain relief after 2 doses of nitroglycerin sublingual and a dose of Ranexa.

## 2016-10-11 NOTE — Transfer of Care (Signed)
Immediate Anesthesia Transfer of Care Note  Patient: [name]  Procedure(s) Performed: Procedure(s) with comments: HARDWARE REMOVAL-LEFT RADIUS (Left) - Left Radius Wrist   Patient Location: PACU  Anesthesia Type:General  Level of Consciousness: awake and responds to stimulation  Airway & Oxygen Therapy: Patient Spontanous Breathing and Patient connected to face mask oxygen  Post-op Assessment: Report given to RN and Post -op Vital signs reviewed and stable  Post vital signs: Reviewed and stable  Last Vitals:  Vitals:   10/11/16 0733 10/11/16 0949  BP: (!) 153/69 108/81  Pulse: 85 (!) 103  Resp: 20 (!) 21  Temp:    SpO2: 99% 91%    Last Pain:  Vitals:   10/11/16 0720  TempSrc:   PainSc: 4          Complications: No apparent anesthesia complications

## 2016-10-11 NOTE — Progress Notes (Signed)
Dr piscitello  In several times to check on pt  Breathing a little better  Heart rate 155 to 120  Lungs continue to have course rales

## 2016-10-11 NOTE — Progress Notes (Signed)
Dr piscitello in to see pt several times   Unable to wean pt from mask  Rhonchi throughout    Expiratory effort but better then before  vomitting gone  No nausea    Dr piscitello talking with family at intervals

## 2016-10-11 NOTE — Progress Notes (Signed)
Pt is refusing to use CPAP for sleep. Pt states the MD told her not to wear it due to her coughing. I explained to pt that I didn't see any documentation about not using CPAP but pt continues to refuse

## 2016-10-11 NOTE — Anesthesia Procedure Notes (Signed)
Procedure Name: LMA Insertion Performed by: Lance Muss Pre-anesthesia Checklist: Patient identified, Patient being monitored, Timeout performed, Emergency Drugs available and Suction available Patient Re-evaluated:Patient Re-evaluated prior to induction Oxygen Delivery Method: Circle system utilized Preoxygenation: Pre-oxygenation with 100% oxygen Induction Type: IV induction Ventilation: Mask ventilation without difficulty LMA: LMA inserted LMA Size: 3.5 Tube type: Oral Number of attempts: 1 Placement Confirmation: positive ETCO2 and breath sounds checked- equal and bilateral Tube secured with: Tape Dental Injury: Teeth and Oropharynx as per pre-operative assessment

## 2016-10-11 NOTE — Anesthesia Postprocedure Evaluation (Signed)
Anesthesia Post Note  Patient: [name]  Procedure(s) Performed: Procedure(s) (LRB): HARDWARE REMOVAL-LEFT RADIUS (Left)  Patient location during evaluation: PACU Anesthesia Type: General Level of consciousness: awake and alert Pain management: pain level controlled Vital Signs Assessment: post-procedure vital signs reviewed and stable Respiratory status: spontaneous breathing, respiratory function stable and patient connected to nasal cannula oxygen Cardiovascular status: blood pressure returned to baseline and stable Postop Assessment: no signs of nausea or vomiting Anesthetic complications: yes Anesthetic complication details: respiratory eventComments: Concern for aspiration during LMA removal.  Medicine will admit patient for overnight observation     Last Vitals:  Vitals:   10/11/16 1200 10/11/16 1210  BP: (!) 113/56 (!) 121/58  Pulse: (!) 108 (!) 110  Resp: (!) 22 (!) 21  Temp:    SpO2: 92% 97%    Last Pain:  Vitals:   10/11/16 0720  TempSrc:   PainSc: 4                  Precious Haws Piscitello

## 2016-10-11 NOTE — Anesthesia Post-op Follow-up Note (Signed)
Anesthesia QCDR form completed.        

## 2016-10-11 NOTE — NC FL2 (Signed)
[place] LEVEL OF CARE SCREENING TOOL     IDENTIFICATION  Patient Name: [name] Birthdate: 05/26/1945 Sex: female Admission Date (Current Location): 10/11/2016  [place] and [place] Number:  Engineering geologist and Address:  [facility], [address] [place], [place] [address]      Provider Number: [id number]  Attending Physician Name and Address:  Fritzi Mandes, MD  Relative Name and Phone Number:       Current Level of Care: Hospital Recommended Level of Care: [place] Prior Approval Number:    Date Approved/Denied:   PASRR Number:  [id number] )  Discharge Plan: SNF    Current Diagnoses: Patient Active Problem List   Diagnosis Date Noted  . Acute respiratory failure with hypoxia ([place]) 10/11/2016  . Chronic diastolic heart failure ([place]) 08/10/2016  . Acute respiratory distress 07/25/2016  . Hypertensive heart disease 07/25/2016  . AKI (acute kidney injury) ([place]) 07/25/2016  . Morbid obesity ([place]) 07/25/2016  . Physical deconditioning 07/25/2016  . Viral gastroenteritis 06/25/2016  . Pyuria 06/25/2016  . Acute renal insufficiency 06/25/2016  . Abdominal pain 06/24/2016  . Headache 02/16/2016  . E coli infection   . Abscess of back   . Sepsis due to Escherichia coli (E. coli) ([place])   . 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 Wound infection after surgery 12/02/2015  . Lumbar degenerative disc disease 11/14/2015  . Itching 02/24/2014  . Anxiety 02/24/2014  . Fatigue 10/23/2013  . Atypical chest pain 06/09/2012  . CAD (coronary artery disease) 06/09/2012  . Hyperlipidemia 08/09/2011  . Arm pain 06/28/2011  . Tachycardia 05/05/2010  . Diabetes mellitus ([place]) 05/05/2010  . OSA on CPAP 05/05/2010  . HTN (hypertension) 05/05/2010  . Edema 05/05/2010  . HYPERTRIGLYCERIDEMIA 06/08/2008  . DYSPNEA 06/08/2008    Orientation RESPIRATION BLADDER Height & Weight     Self, Time, Situation,  Place  O2 (4 Liters Oxygen ) Continent Weight: 219 lb (99.3 kg) Height:  5\' 3"  (160 cm)  BEHAVIORAL SYMPTOMS/MOOD NEUROLOGICAL BOWEL NUTRITION STATUS   (none)  (none) Continent Diet (Diet: Soft )  AMBULATORY STATUS COMMUNICATION OF NEEDS Skin   Extensive Assist Verbally Surgical wounds (Incision: Left Arm )                       Personal Care Assistance Level of Assistance  Bathing, Feeding, Dressing Bathing Assistance: Limited assistance Feeding assistance: Independent Dressing Assistance: Limited assistance     Functional Limitations Info  Sight, Hearing, Speech Sight Info: Adequate Hearing Info: Adequate Speech Info: Adequate    SPECIAL CARE FACTORS FREQUENCY  PT (By licensed PT), OT (By licensed OT)     PT Frequency:  (5) OT Frequency:  (5)            Contractures      Additional Factors Info  Code Status, Allergies Code Status Info:  (Full Code. ) Allergies Info:  (Ceftin Cefuroxime Axetil, Codeine, Penicillins, Vicodin Hydrocodone-acetaminophen)           Current Medications (10/11/2016):  This is the current hospital active medication list Current Facility-Administered Medications  Medication Dose Route Frequency Provider Last Rate Last Dose  . acetaminophen (TYLENOL) 325 MG tablet           . albuterol (PROVENTIL) (2.5 MG/3ML) 0.083% nebulizer solution 2.5 mg  2.5 mg Nebulization Q2H PRN Fritzi Mandes, MD   2.5 mg at 10/11/16 1333  . albuterol (PROVENTIL) (2.5 MG/3ML) 0.083% nebulizer solution  2.5 mg  2.5 mg Nebulization Q6H PRN Fritzi Mandes, MD      . cholecalciferol (VITAMIN D) tablet 2,000 Units  2,000 Units Oral Daily Fritzi Mandes, MD      . docusate sodium (COLACE) capsule 100 mg  100 mg Oral BID Fritzi Mandes, MD      . enoxaparin (LOVENOX) injection 40 mg  40 mg Subcutaneous Q24H Fritzi Mandes, MD      . hyoscyamine (LEVSIN SL) SL tablet 0.25 mg  0.25 mg Sublingual Q4H PRN Fritzi Mandes, MD      . ipratropium-albuterol (DUONEB) 0.5-2.5 (3) MG/3ML  nebulizer solution           . lactated ringers infusion   Intravenous Continuous Lovell Sheehan, MD      . Derrill Memo ON 10/12/2016] levothyroxine (SYNTHROID, LEVOTHROID) tablet 75 mcg  75 mcg Oral QAC breakfast Fritzi Mandes, MD      . lisinopril (PRINIVIL,ZESTRIL) tablet 5 mg  5 mg Oral QHS Fritzi Mandes, MD      . methylPREDNISolone sodium succinate (SOLU-MEDROL) 125 mg/2 mL injection 100 mg  100 mg Intravenous Once Fritzi Mandes, MD      . Derrill Memo ON 10/12/2016] methylPREDNISolone sodium succinate (SOLU-MEDROL) 125 mg/2 mL injection 60 mg  60 mg Intravenous Q24H Fritzi Mandes, MD      . metoCLOPramide (REGLAN) tablet 5-10 mg  5-10 mg Oral Q8H PRN Lovell Sheehan, MD       Or  . metoCLOPramide (REGLAN) injection 5-10 mg  5-10 mg Intravenous Q8H PRN Lovell Sheehan, MD      . metoprolol succinate (TOPROL-XL) 24 hr tablet 25 mg  25 mg Oral QHS Fritzi Mandes, MD      . morphine 2 MG/ML injection 1 mg  1 mg Intravenous Q6H PRN Fritzi Mandes, MD      . nitroGLYCERIN (NITROSTAT) SL tablet 0.4 mg  0.4 mg Sublingual Q5 min PRN Fritzi Mandes, MD      . ondansetron [facility]) 4 MG/2ML injection           . ondansetron (ZOFRAN) tablet 4 mg  4 mg Oral Q6H PRN Lovell Sheehan, MD       Or  . ondansetron [facility]) injection 4 mg  4 mg Intravenous Q6H PRN Lovell Sheehan, MD      . ondansetron [facility]) tablet 4 mg  4 mg Oral Q6H PRN Fritzi Mandes, MD       Or  . ondansetron [facility]) injection 4 mg  4 mg Intravenous Q6H PRN Fritzi Mandes, MD   4 mg at 10/11/16 1400  . oxyCODONE (Oxy IR/ROXICODONE) immediate release tablet 5-10 mg  5-10 mg Oral Q3H PRN Lovell Sheehan, MD      . oxyCODONE-acetaminophen (PERCOCET/ROXICET) 5-325 MG per tablet 1-2 tablet  1-2 tablet Oral Q6H PRN Fritzi Mandes, MD      . pantoprazole (PROTONIX) EC tablet 40 mg  40 mg Oral BID AC Fritzi Mandes, MD      . polyethylene glycol (MIRALAX / GLYCOLAX) packet 17 g  17 g Oral Daily PRN Fritzi Mandes, MD      . ranolazine (RANEXA) 12 hr tablet 500 mg  500 mg Oral BID  PRN Fritzi Mandes, MD      . rOPINIRole (REQUIP) tablet 5 mg  5 mg Oral BID Fritzi Mandes, MD      . torsemide (DEMADEX) tablet 20-40 mg  20-40 mg Oral q morning - 10a Fritzi Mandes, MD      .  traMADol (ULTRAM) tablet 50 mg  50 mg Oral TID PRN Fritzi Mandes, MD       Facility-Administered Medications Ordered in Other Encounters  Medication Dose Route Frequency Provider Last Rate Last Dose  . dexamethasone (DECADRON) injection 10 mg  10 mg Intravenous Once Lovell Sheehan, MD         Discharge Medications: Please see discharge summary for a list of discharge medications.  Relevant Imaging Results:  Relevant Lab Results:   Additional Information  (SSN: [id number])  Bruk [place], Veronia Beets, LCSW

## 2016-10-11 NOTE — Progress Notes (Signed)
Spit up scant amt of bile x 2

## 2016-10-11 NOTE — Progress Notes (Signed)
zofran given  Pt vomitted approx 50cc green    Chest xray done

## 2016-10-12 ENCOUNTER — Inpatient Hospital Stay: Payer: Medicare Other

## 2016-10-12 LAB — GLUCOSE, CAPILLARY
GLUCOSE-CAPILLARY: 237 mg/dL — AB (ref 65–99)
GLUCOSE-CAPILLARY: 245 mg/dL — AB (ref 65–99)
Glucose-Capillary: 227 mg/dL — ABNORMAL HIGH (ref 65–99)
Glucose-Capillary: 271 mg/dL — ABNORMAL HIGH (ref 65–99)

## 2016-10-12 LAB — TROPONIN I: Troponin I: <0.03 ng/mL (ref <0.03)

## 2016-10-12 MED ORDER — IPRATROPIUM-ALBUTEROL 0.5-2.5 (3) MG/3ML IN SOLN
3.0000 mL | Freq: Four times a day (QID) | RESPIRATORY_TRACT | Status: DC
  Administered 2016-10-12 – 2016-10-14 (×8): 3 mL via RESPIRATORY_TRACT
  Filled 2016-10-12 (×8): qty 3

## 2016-10-12 MED ORDER — OXYCODONE HCL 5 MG PO TABS
5.0000 mg | ORAL_TABLET | ORAL | Status: DC | PRN
  Administered 2016-10-13: 10 mg via ORAL
  Filled 2016-10-12: qty 2

## 2016-10-12 MED ORDER — CLINDAMYCIN PHOSPHATE 600 MG/50ML IV SOLN
600.0000 mg | Freq: Three times a day (TID) | INTRAVENOUS | Status: DC
  Administered 2016-10-12 – 2016-10-14 (×6): 600 mg via INTRAVENOUS
  Filled 2016-10-12 (×10): qty 50

## 2016-10-12 NOTE — Progress Notes (Signed)
[place] [facility] [place] NAME: [name]    MR#:  [id number]  DATE OF BIRTH:  03/29/1945  SUBJECTIVE:  CHIEF COMPLAINT:  No chief complaint on file. With SOB after having vomiting in post op recovery. Still have Wheezing and SOB.  REVIEW OF SYSTEMS:  CONSTITUTIONAL: No fever, fatigue or weakness.  EYES: No blurred or double vision.  EARS, NOSE, AND THROAT: No tinnitus or ear pain.  RESPIRATORY: No cough, positive for shortness of breath, wheezing , no hemoptysis.  CARDIOVASCULAR: No chest pain, orthopnea, edema.  GASTROINTESTINAL: No nausea, vomiting, diarrhea or abdominal pain.  GENITOURINARY: No dysuria, hematuria.  ENDOCRINE: No polyuria, nocturia,  HEMATOLOGY: No anemia, easy bruising or bleeding SKIN: No rash or lesion. MUSCULOSKELETAL: No joint pain or arthritis.   NEUROLOGIC: No tingling, numbness, weakness.  PSYCHIATRY: No anxiety or depression.   ROS  DRUG ALLERGIES:   Allergies  Allergen Reactions  . Ceftin [Cefuroxime Axetil] Diarrhea  . Codeine Rash       . Penicillins Rash    Has patient had a PCN reaction causing immediate rash, facial/tongue/throat swelling, SOB or lightheadedness with hypotension: NO Has patient had a PCN reaction causing severe rash involving mucus membranes or skin necrosis: NO Has patient had a PCN retioion that required hospitalization NO Has patient had a PCN reaction occurring within the last 10 years: NO If all of the above answers are "NO", then may proceed with Cephalosporin use.  . Vicodin [Hydrocodone-Acetaminophen] Other (See Comments)    passes out    VITALS:  Blood pressure (!) 100/44, pulse (!) 101, temperature 98.5 F (36.9 C), temperature source Oral, resp. rate 20, height 5\' 3"  (1.6 m), weight 99.3 kg (219 lb), SpO2 99 %.  PHYSICAL EXAMINATION:   GENERAL:  71 y.o.-year-old patient lying in the bed with no acute distress. obese EYES: Pupils equal, round, reactive to light and  accommodation. No scleral icterus. Extraocular muscles intact.  HEENT: Head atraumatic, normocephalic. Oropharynx and nasopharynx clear.  NECK:  Supple, no jugular venous distention. No thyroid enlargement, no tenderness.  LUNGS: course breath sounds bilaterally, bilateral wheezing,no rales,rhonchi or crepitation. No use of accessory muscles of respiration.  CARDIOVASCULAR: S1, S2 normal. No murmurs, rubs, or gallops.  ABDOMEN: Soft, nontender, nondistended. Bowel sounds present. No organomegaly or mass.  EXTREMITIES: No pedal edema, cyanosis, or clubbing. Left fore arm and wrist dressing present. NEUROLOGIC: Cranial nerves II through XII are intact. Muscle strength 5/5 in all extremities. Sensation intact. Gait not checked.  PSYCHIATRIC: The patient is alert and oriented x 3.  SKIN: No obvious rash, lesion, or ulcer.  Physical Exam LABORATORY PANEL:   CBC  Recent Labs Lab 10/11/16 1411  WBC 11.1*  HGB 12.3  HCT 36.5  PLT 231   ------------------------------------------------------------------------------------------------------------------  Chemistries   Recent Labs Lab 10/11/16 1411  CREATININE 1.25*   ------------------------------------------------------------------------------------------------------------------  Cardiac Enzymes  Recent Labs Lab 10/12/16 0331 10/12/16 0957  TROPONINI <0.03 <0.03   ------------------------------------------------------------------------------------------------------------------  RADIOLOGY:  Dg Chest 1 View  Result Date: 10/11/2016 CLINICAL DATA:  Respiratory distress. EXAM: CHEST 1 VIEW COMPARISON:  07/27/2016. FINDINGS: Normal sized heart. Increased bilateral hilar and perihilar density with linear densities in the left mid and lower lung zone. The interstitial markings remain mildly prominent. Unremarkable bones. IMPRESSION: 1. Interval probable atelectasis in the hilar and perihilar regions bilaterally. 2. Interval linear  atelectasis in the left mid and lower lung zone. 3. Stable mild chronic interstitial lung disease. Electronically Signed  By: Claudie Revering M.D.   On: 10/11/2016 10:35   Dg Chest Port 1 View  Result Date: 10/12/2016 CLINICAL DATA:  Aspiration after extubation. EXAM: PORTABLE CHEST 1 VIEW COMPARISON:  Yesterday FINDINGS: Increased patchy right-sided lung opacity from aspiration and/or pneumonia. Patchy left-sided opacity is also seen. Better inflation. Stable heart size. No pneumothorax or effusion. IMPRESSION: 1. History of aspiration with increased pneumonitis or pneumonia on the right. 2. Better lung inflation compared yesterday. Electronically Signed   By: Monte Fantasia M.D.   On: 10/12/2016 07:47    ASSESSMENT AND PLAN:   Active Problems:   Acute respiratory failure with hypoxia (HCC)  Kinslie Cuervo  is a 71 y.o. female with a known history of CAD, obesity, diabetes, hyperlipidemia, obstructive sleep apnea on CPAP with questionable history of asthma/COPD admitted from PACU after patient was found to have increasing respiratory distress with hypoxia after surgery.  1. Acute hypoxic respiratory distress secondary to   aspirationPneumonia , COPD flare -patient reports some secondhand smoke exposure. She does not have a formal diagnosis of COPD/asthma - Advised to follow with her pulm Dr. Raul Del after discharge for Lung volume studies. -Patient currently is wheezing, on IV Solu-Medrol, nebulizer, incentive spirometer, inhalers -Her chest x-rays consistent with chronic interstitial lung disease and some aspiration. -Pulmonary function test as outpatient -Wean oxygen as tolerated - IV clindamycin stared as still have SOB and sputum production.  2. Status post removal of hardware of left wrist that was placed secondary to left distal radial fracture -When necessary pain meds - as per ortho, steroids will help with this also.  3. Obstructive sleep apnea on CPAP  4.diabetes continue  insulin Lantus and sliding scale  5. Hypothyroidism continue Synthroid  6. Hypertension continue lisinopril  7. DVT prophylaxis subcutaneous Lovenox     All the records are reviewed and case discussed with Care Management/Social Workerr. Management plans discussed with the patient, family and they are in agreement.  CODE STATUS: full.  TOTAL TIME TAKING CARE OF THIS PATIENT: 35 minutes.    POSSIBLE D/C IN 1-2 DAYS, DEPENDING ON CLINICAL CONDITION.   Vaughan Basta M.D on 10/12/2016   Between 7am to 6pm - Pager - [phone]  After 6pm go to [email or web address] - password [email or web address] [facility]  Office  [phone]  CC: Primary care physician; Rusty Aus, MD  Note: This dictation was prepared with Dragon dictation along with smaller phrase technology. Any transcriptional errors that result from this process are unintentional.

## 2016-10-12 NOTE — Progress Notes (Signed)
Subjective:  Patient reports pain as mild.   Objective:   VITALS:   Vitals:   10/12/16 0152 10/12/16 0222 10/12/16 0507 10/12/16 0725  BP: (!) 114/51  (!) 97/59 (!) 111/50  Pulse: (!) 103  (!) 105 100  Resp: (!) 22  18 20   Temp: 97.7 F (36.5 C)  98.4 F (36.9 C) 98.6 F (37 C)  TempSrc: Oral  Oral Oral  SpO2: 97% 92% 92% 98%  Weight:      Height:        PHYSICAL EXAM:  Neurovascular intact flexes and extends all digits, thumb EPL is intact, sensation to light touch is intact  LABS  Results for orders placed or performed during the hospital encounter of 10/11/16 (from the past 24 hour(s))  Glucose, capillary     Status: Abnormal   Collection Time: 10/11/16  9:54 AM  Result Value Ref Range   Glucose-Capillary 190 (H) 65 - 99 mg/dL  CBC     Status: Abnormal   Collection Time: 10/11/16  2:11 PM  Result Value Ref Range   WBC 11.1 (H) 3.6 - 11.0 K/uL   RBC 4.27 3.80 - 5.20 MIL/uL   Hemoglobin 12.3 12.0 - 16.0 g/dL   HCT 36.5 35.0 - 47.0 %   MCV 85.6 80.0 - 100.0 fL   MCH 28.9 26.0 - 34.0 pg   MCHC 33.7 32.0 - 36.0 g/dL   RDW 14.6 (H) 11.5 - 14.5 %   Platelets 231 150 - 440 K/uL  Creatinine, serum     Status: Abnormal   Collection Time: 10/11/16  2:11 PM  Result Value Ref Range   Creatinine, Ser 1.25 (H) 0.44 - 1.00 mg/dL   GFR calc non Af Amer 42 (L) >60 mL/min   GFR calc Af Amer 49 (L) >60 mL/min  Glucose, capillary     Status: Abnormal   Collection Time: 10/11/16  4:43 PM  Result Value Ref Range   Glucose-Capillary 243 (H) 65 - 99 mg/dL  Glucose, capillary     Status: Abnormal   Collection Time: 10/11/16  9:28 PM  Result Value Ref Range   Glucose-Capillary 375 (H) 65 - 99 mg/dL  Troponin I (q 6hr x 3)     Status: None   Collection Time: 10/11/16  9:59 PM  Result Value Ref Range   Troponin I <0.03 <0.03 ng/mL  Troponin I (q 6hr x 3)     Status: None   Collection Time: 10/12/16  3:31 AM  Result Value Ref Range   Troponin I <0.03 <0.03 ng/mL  Glucose,  capillary     Status: Abnormal   Collection Time: 10/12/16  7:28 AM  Result Value Ref Range   Glucose-Capillary 237 (H) 65 - 99 mg/dL    Dg Chest 1 View  Result Date: 10/11/2016 CLINICAL DATA:  Respiratory distress. EXAM: CHEST 1 VIEW COMPARISON:  07/27/2016. FINDINGS: Normal sized heart. Increased bilateral hilar and perihilar density with linear densities in the left mid and lower lung zone. The interstitial markings remain mildly prominent. Unremarkable bones. IMPRESSION: 1. Interval probable atelectasis in the hilar and perihilar regions bilaterally. 2. Interval linear atelectasis in the left mid and lower lung zone. 3. Stable mild chronic interstitial lung disease. Electronically Signed   By: Claudie Revering M.D.   On: 10/11/2016 10:35   Dg Chest Port 1 View  Result Date: 10/12/2016 CLINICAL DATA:  Aspiration after extubation. EXAM: PORTABLE CHEST 1 VIEW COMPARISON:  Yesterday FINDINGS: Increased patchy right-sided lung opacity from  aspiration and/or pneumonia. Patchy left-sided opacity is also seen. Better inflation. Stable heart size. No pneumothorax or effusion. IMPRESSION: 1. History of aspiration with increased pneumonitis or pneumonia on the right. 2. Better lung inflation compared yesterday. Electronically Signed   By: Monte Fantasia M.D.   On: 10/12/2016 07:47    Assessment/Plan: 1 Day Post-Op   Active Problems:   Acute respiratory failure with hypoxia ([place])   Appreciate care. Cleared from an orthopedic standpoint for discharge. Non-weight bearing through the left upper extremity. She has an appointment for OT at the [facility] office on Tuesday. Please call with questions.   The plan was for a Medrol Dose Pack upon discharge to treat possible complex regional pain syndrome of the left upper extremity.  Thank you,   [name] , MD 10/12/2016, 8:25 AM

## 2016-10-12 NOTE — Care Management Note (Signed)
Case Management Note  Patient Details  Name: DAMARIA VACHON MRN: [id number] Date of Birth: February 18, 1945  Subjective/Objective:    Case discussed with Dr. Harlow Mares. Patient has no home orthopedic needs. Do not anticipate any home health needs. Will have case manager reassess tomorrow. It is anticipated that patient will discharge home tomorrow.                 Action/Plan:   Expected Discharge Date:  10/12/16               Expected Discharge Plan:     In-House Referral:     Discharge planning Services  CM Consult  Post Acute Care Choice:    Choice offered to:     DME Arranged:    DME Agency:     HH Arranged:    HH Agency:     Status of Service:  In process, will continue to follow  If discussed at Long Length of Stay Meetings, dates discussed:    Additional Comments:  Jolly Mango, RN 10/12/2016, 9:00 AM

## 2016-10-12 NOTE — Progress Notes (Signed)
Pt placed on [facility] CPAP for sleep. CPAP plugged into red outlet. Pt tolerating well

## 2016-10-12 NOTE — Progress Notes (Signed)
Inpatient Diabetes Program Recommendations  AACE/ADA: New Consensus Statement on Inpatient Glycemic Control (2015)  Target Ranges:  Prepandial:   less than 140 mg/dL      Peak postprandial:   less than 180 mg/dL (1-2 hours)      Critically ill patients:  140 - 180 mg/dL   Lab Results  Component Value Date   GLUCAP 245 (H) 10/12/2016   HGBA1C 6.3 (H) 07/23/2016    Review of Glycemic Control  Results for [name], [name] (MRN [id number]) as of 10/12/2016 13:34  Ref. Range 10/11/2016 09:54 10/11/2016 16:43 10/11/2016 21:28 10/12/2016 07:28 10/12/2016 11:40  Glucose-Capillary Latest Ref Range: 65 - 99 mg/dL 190 (H) 243 (H) 375 (H) 237 (H) 245 (H)    Diabetes history: Type 2 Outpatient Diabetes medications: Lantus 32 units q day, Novolog 10 units q breakfast, Novolog 10 units q lunch and Novolog 16 units q supper (>200mg /dl- add 2 units )  Current orders for Inpatient glycemic control: Lantus 32 units  Qhs, Novolog 0-9 units tid/hs   * Solumedrol 60mg  q24h  Inpatient Diabetes Program Recommendations: Elevated CBG likely as a result of steroids.  Recommend if the patient remains in the hospital and on steroids, consider increasing Lantus to 35 units qday (0.35 units/kg) -  decrease as steroids are tapered or dicontinued.   Continue Novolog correction as ordered.   Gentry Fitz, RN, BA, MHA, CDE Diabetes Coordinator Inpatient Diabetes Program  [phone] (Team Pager) [phone] ([place]) 10/12/2016 1:44 PM

## 2016-10-13 LAB — CBC
HEMATOCRIT: 30.2 % — AB (ref 35.0–47.0)
HEMOGLOBIN: 10.3 g/dL — AB (ref 12.0–16.0)
MCH: 30 pg (ref 26.0–34.0)
MCHC: 34 g/dL (ref 32.0–36.0)
MCV: 88.4 fL (ref 80.0–100.0)
Platelets: 230 10*3/uL (ref 150–440)
RBC: 3.42 MIL/uL — AB (ref 3.80–5.20)
RDW: 15 % — ABNORMAL HIGH (ref 11.5–14.5)
WBC: 13.5 10*3/uL — AB (ref 3.6–11.0)

## 2016-10-13 LAB — BASIC METABOLIC PANEL
ANION GAP: 7 (ref 5–15)
BUN: 34 mg/dL — ABNORMAL HIGH (ref 6–20)
CALCIUM: 9.3 mg/dL (ref 8.9–10.3)
CO2: 29 mmol/L (ref 22–32)
Chloride: 102 mmol/L (ref 101–111)
Creatinine, Ser: 1.12 mg/dL — ABNORMAL HIGH (ref 0.44–1.00)
GFR calc Af Amer: 56 mL/min — ABNORMAL LOW (ref >60)
GFR, EST NON AFRICAN AMERICAN: 48 mL/min — AB (ref >60)
GLUCOSE: 180 mg/dL — AB (ref 65–99)
POTASSIUM: 4.5 mmol/L (ref 3.5–5.1)
SODIUM: 138 mmol/L (ref 135–145)

## 2016-10-13 LAB — GLUCOSE, CAPILLARY
GLUCOSE-CAPILLARY: 140 mg/dL — AB (ref 65–99)
GLUCOSE-CAPILLARY: 342 mg/dL — AB (ref 65–99)
Glucose-Capillary: 187 mg/dL — ABNORMAL HIGH (ref 65–99)
Glucose-Capillary: 245 mg/dL — ABNORMAL HIGH (ref 65–99)

## 2016-10-13 NOTE — Progress Notes (Signed)
[place] at [place] NAME: [name]    MR#:  [id number]  DATE OF BIRTH:  07-06-1945  SUBJECTIVE:  CHIEF COMPLAINT:  No chief complaint on file. With SOB after having vomiting in post op recovery. Still have Wheezing and SOB. Have secretions, mo chest pain.  REVIEW OF SYSTEMS:  CONSTITUTIONAL: No fever, fatigue or weakness.  EYES: No blurred or double vision.  EARS, NOSE, AND THROAT: No tinnitus or ear pain.  RESPIRATORY: No cough, positive for shortness of breath, wheezing , no hemoptysis.  CARDIOVASCULAR: No chest pain, orthopnea, edema.  GASTROINTESTINAL: No nausea, vomiting, diarrhea or abdominal pain.  GENITOURINARY: No dysuria, hematuria.  ENDOCRINE: No polyuria, nocturia,  HEMATOLOGY: No anemia, easy bruising or bleeding SKIN: No rash or lesion. MUSCULOSKELETAL: No joint pain or arthritis.   NEUROLOGIC: No tingling, numbness, weakness.  PSYCHIATRY: No anxiety or depression.   ROS  DRUG ALLERGIES:   Allergies  Allergen Reactions  . Ceftin [Cefuroxime Axetil] Diarrhea  . Codeine Rash       . Penicillins Rash    Has patient had a PCN reaction causing immediate rash, facial/tongue/throat swelling, SOB or lightheadedness with hypotension: NO Has patient had a PCN reaction causing severe rash involving mucus membranes or skin necrosis: NO Has patient had a PCN retioion that required hospitalization NO Has patient had a PCN reaction occurring within the last 10 years: NO If all of the above answers are "NO", then may proceed with Cephalosporin use.  . Vicodin [Hydrocodone-Acetaminophen] Other (See Comments)    passes out    VITALS:  Blood pressure (!) 113/56, pulse (!) 108, temperature 97.6 F (36.4 C), temperature source Oral, resp. rate 16, height 5\' 3"  (1.6 m), weight 99.3 kg (219 lb), SpO2 94 %.  PHYSICAL EXAMINATION:   GENERAL:  71 y.o.-year-old patient lying in the bed with no acute distress. obese EYES: Pupils  equal, round, reactive to light and accommodation. No scleral icterus. Extraocular muscles intact.  HEENT: Head atraumatic, normocephalic. Oropharynx and nasopharynx clear.  NECK:  Supple, no jugular venous distention. No thyroid enlargement, no tenderness.  LUNGS: course breath sounds bilaterally, bilateral wheezing,no rales,rhonchi or crepitation. No use of accessory muscles of respiration.  CARDIOVASCULAR: S1, S2 normal. No murmurs, rubs, or gallops.  ABDOMEN: Soft, nontender, nondistended. Bowel sounds present. No organomegaly or mass.  EXTREMITIES: No pedal edema, cyanosis, or clubbing. Left fore arm and wrist dressing present. NEUROLOGIC: Cranial nerves II through XII are intact. Muscle strength 5/5 in all extremities. Sensation intact. Gait not checked.  PSYCHIATRIC: The patient is alert and oriented x 3.  SKIN: No obvious rash, lesion, or ulcer.  Physical Exam LABORATORY PANEL:   CBC  Recent Labs Lab 10/13/16 0427  WBC 13.5*  HGB 10.3*  HCT 30.2*  PLT 230   ------------------------------------------------------------------------------------------------------------------  Chemistries   Recent Labs Lab 10/13/16 0427  NA 138  K 4.5  CL 102  CO2 29  GLUCOSE 180*  BUN 34*  CREATININE 1.12*  CALCIUM 9.3   ------------------------------------------------------------------------------------------------------------------  Cardiac Enzymes  Recent Labs Lab 10/12/16 0331 10/12/16 0957  TROPONINI <0.03 <0.03   ------------------------------------------------------------------------------------------------------------------  RADIOLOGY:  Dg Chest Port 1 View  Result Date: 10/12/2016 CLINICAL DATA:  Aspiration after extubation. EXAM: PORTABLE CHEST 1 VIEW COMPARISON:  Yesterday FINDINGS: Increased patchy right-sided lung opacity from aspiration and/or pneumonia. Patchy left-sided opacity is also seen. Better inflation. Stable heart size. No pneumothorax or effusion.  IMPRESSION: 1. History of aspiration with increased pneumonitis or  pneumonia on the right. 2. Better lung inflation compared yesterday. Electronically Signed   By: Monte Fantasia M.D.   On: 10/12/2016 07:47    ASSESSMENT AND PLAN:   Active Problems:   Acute respiratory failure with hypoxia (HCC)  [name]  is a 71 y.o. female with a known history of CAD, obesity, diabetes, hyperlipidemia, obstructive sleep apnea on CPAP with questionable history of asthma/COPD admitted from PACU after patient was found to have increasing respiratory distress with hypoxia after surgery.  1. Acute hypoxic respiratory distress secondary to   aspirationPneumonia , COPD flare -patient reports some second hand smoke exposure. She does not have a formal diagnosis of COPD/asthma - Advised to follow with her pulm Dr. Raul Del after discharge for Lung volume studies. -Patient currently is wheezing, on IV Solu-Medrol, nebulizer, incentive spirometer, inhalers -Her chest x-rays consistent with chronic interstitial lung disease and some aspiration. -Pulmonary function test as outpatient -Wean oxygen as tolerated - IV clindamycin stared as still have SOB and sputum production. - Encouraged to use incentive spirometer.  2. Status post removal of hardware of left wrist that was placed secondary to left distal radial fracture -When necessary pain meds - as per ortho, steroids will help with this also. Will give tapering steroid on d/c.  3. Obstructive sleep apnea on CPAP  4.diabetes continue insulin Lantus and sliding scale  5. Hypothyroidism continue Synthroid  6. Hypertension continue lisinopril  7. DVT prophylaxis subcutaneous Lovenox    All the records are reviewed and case discussed with Care Management/Social Workerr. Management plans discussed with the patient, family and they are in agreement.  CODE STATUS: full.  TOTAL TIME TAKING CARE OF THIS PATIENT: 35 minutes.  Spoke to pt's husband  today in room.  POSSIBLE D/C IN 1-2 DAYS, DEPENDING ON CLINICAL CONDITION.   Vaughan Basta M.D on 10/13/2016   Between 7am to 6pm - Pager - [phone]  After 6pm go to [email or web address] - password [email or web address] [facility]  Office  [phone]  CC: Primary care physician; Rusty Aus, MD  Note: This dictation was prepared with Dragon dictation along with smaller phrase technology. Any transcriptional errors that result from this process are unintentional.

## 2016-10-14 ENCOUNTER — Inpatient Hospital Stay: Payer: Medicare Other

## 2016-10-14 LAB — GLUCOSE, CAPILLARY
GLUCOSE-CAPILLARY: 104 mg/dL — AB (ref 65–99)
Glucose-Capillary: 79 mg/dL (ref 65–99)

## 2016-10-14 LAB — CBC
HEMATOCRIT: 29.5 % — AB (ref 35.0–47.0)
HEMOGLOBIN: 10.1 g/dL — AB (ref 12.0–16.0)
MCH: 29.5 pg (ref 26.0–34.0)
MCHC: 34.2 g/dL (ref 32.0–36.0)
MCV: 86.3 fL (ref 80.0–100.0)
Platelets: 229 10*3/uL (ref 150–440)
RBC: 3.41 MIL/uL — ABNORMAL LOW (ref 3.80–5.20)
RDW: 14.4 % (ref 11.5–14.5)
WBC: 12.2 10*3/uL — ABNORMAL HIGH (ref 3.6–11.0)

## 2016-10-14 MED ORDER — CLINDAMYCIN HCL 300 MG PO CAPS: 300.0000 mg | ORAL_CAPSULE | Freq: Three times a day (TID) | ORAL | 0 refills | Status: AC

## 2016-10-14 MED ORDER — IPRATROPIUM-ALBUTEROL 0.5-2.5 (3) MG/3ML IN SOLN: 3.0000 mL | Freq: Four times a day (QID) | RESPIRATORY_TRACT | 0 refills | Status: DC

## 2016-10-14 MED ORDER — PREDNISONE 10 MG (21) PO TBPK: ORAL_TABLET | ORAL | 0 refills | Status: DC

## 2016-10-14 NOTE — Progress Notes (Signed)
Patient discharging home. Instructions given to patient, verbalized understanding. Husband at bedside.

## 2016-10-14 NOTE — Discharge Summary (Signed)
[place] at [place] NAME: [name]    MR#:  [id number]  DATE OF BIRTH:  09-27-1945  DATE OF ADMISSION:  10/11/2016 ADMITTING PHYSICIAN: Fritzi Mandes, MD  DATE OF DISCHARGE: 10/14/2016  PRIMARY CARE PHYSICIAN: Rusty Aus, MD    ADMISSION DIAGNOSIS:  S52.532D Colles' fx left radius, subs for clos fx w routn heal  DISCHARGE DIAGNOSIS:  Active Problems:   Acute respiratory failure with hypoxia (HCC)    Aspiration pneumonia    COPD  SECONDARY DIAGNOSIS:   Past Medical History:  Diagnosis Date  . Anginal pain ([place])   . Anxiety   . Arthritis   . CHF (congestive heart failure) ([place])   . Chronic back pain    stenosis.degenerative disc,some scoliosis  . Constipation    takes Stool Softener daily  . Coronary artery disease   . Depression    takes Cymbalta daily  . Diabetes mellitus    Type 2 diabetic. Average fasting blood sugar runs high 170-200  . Diverticulosis   . E coli infection   . GERD (gastroesophageal reflux disease)    takes Nexium daily  . Headache   . Headache 02/16/2016  . Hemorrhoids   . History of colon polyps    benign  . History of gout    doesn't take any meds  . History of hiatal hernia   . History of vertigo    doesn't take any meds  . Hyperlipidemia    takes Praluent daily  . Hypertension    currently BP medications are on hold   . Hypothyroidism    takes Synthroid daily  . Insomnia    takes Restoril nightly  . Joint pain   . Joint swelling   . Muscle spasm    takes Robaxin as needed  . NSVT (nonsustained ventricular tachycardia) ([place])   . OSA on CPAP   . Periodic heart flutter   . Peripheral edema    takes LAsix as needed  . Peripheral vascular disease ([place])    AAA as stated per pt / was just discovered and pt states has not been referred to vascular MD   . Restless leg    takes Requip daily  . Rosacea   . Sleep apnea   . Urinary incontinence   . Urinary urgency   . Varicose  veins   . Weakness    numbness and tingling.    HOSPITAL COURSE:   1. Acute hypoxic respiratory distress secondary to   aspiration Pneumonia , COPD flare -patient reports some second hand smoke exposure. She does not have a formal diagnosis of COPD/asthma - Advised to follow with her pulm Dr. Raul Del after discharge for Lung volume studies. -Patient currently is wheezing, on IV Solu-Medrol, nebulizer, incentive spirometer, inhalers -Her chest x-rays consistent with chronic interstitial lung disease and some aspiration. -Pulmonary function test as outpatient -Wean oxygen as tolerated, now on room air. - IV clindamycin stared as still have SOB and sputum production. Much improved. - Encouraged to use incentive spirometer. - will prescribe nebulizer on discharge.  2. Status post removal of hardware of left wrist that was placed secondary to left distal radial fracture -When necessary pain meds - as per ortho, steroids will help with this also. Will give tapering steroid on d/c.  3. Obstructive sleep apnea on CPAP  4.diabetes continue insulin Lantus and sliding scale  5. Hypothyroidism continue Synthroid  6. Hypertension continue lisinopril  7. DVT prophylaxis subcutaneous  Lovenox  DISCHARGE CONDITIONS:   Stable.  CONSULTS OBTAINED:  Treatment Team:  Vaughan Basta, MD  DRUG ALLERGIES:   Allergies  Allergen Reactions  . Ceftin [Cefuroxime Axetil] Diarrhea  . Codeine Rash       . Penicillins Rash    Has patient had a PCN reaction causing immediate rash, facial/tongue/throat swelling, SOB or lightheadedness with hypotension: NO Has patient had a PCN reaction causing severe rash involving mucus membranes or skin necrosis: NO Has patient had a PCN retioion that required hospitalization NO Has patient had a PCN reaction occurring within the last 10 years: NO If all of the above answers are "NO", then may proceed with Cephalosporin use.  . Vicodin  [Hydrocodone-Acetaminophen] Other (See Comments)    passes out    DISCHARGE MEDICATIONS:   Current Discharge Medication List    START taking these medications   Details  clindamycin (CLEOCIN) 300 MG capsule Take 1 capsule (300 mg total) by mouth 3 (three) times daily. Qty: 15 capsule, Refills: 0    ipratropium-albuterol (DUONEB) 0.5-2.5 (3) MG/3ML SOLN Take 3 mLs by nebulization every 6 (six) hours. Qty: 360 mL, Refills: 0    oxyCODONE (ROXICODONE) 5 MG immediate release tablet Take 1 tablet (5 mg total) by mouth every 6 (six) hours as needed. Qty: 30 tablet, Refills: 0    predniSONE (STERAPRED UNI-PAK 21 TAB) 10 MG (21) TBPK tablet Take 6 tabs first day, 5 tab on day 2, then 4 on day 3rd, 3 tabs on day 4th , 2 tab on day 5th, and 1 tab on 6th day. Qty: 21 tablet, Refills: 0      CONTINUE these medications which have NOT CHANGED   Details  albuterol (PROVENTIL HFA;VENTOLIN HFA) 108 (90 Base) MCG/ACT inhaler Inhale 2 puffs into the lungs every 6 (six) hours as needed for wheezing or shortness of breath. Qty: 1 Inhaler, Refills: 0    Alirocumab (PRALUENT) 150 MG/ML SOPN Inject 1 pen into the skin every 14 (fourteen) days. Qty: 6 pen, Refills: 12    aspirin 81 MG tablet Take 81 mg by mouth at bedtime.     Azelastine HCl (ASTEPRO) 0.15 % SOLN Place 2 sprays into both nostrils at bedtime.     Cholecalciferol (VITAMIN D) 2000 units tablet Take 2,000 Units by mouth daily.    desonide (DESOWEN) 0.05 % lotion Apply 1 application topically daily.    esomeprazole (NEXIUM) 40 MG capsule Take 40 mg by mouth 3 (three) times daily before meals.     gabapentin (NEURONTIN) 300 MG capsule Take 300 mg by mouth 3 (three) times daily.     hyoscyamine (LEVSIN SL) 0.125 MG SL tablet Place 2 tablets (0.25 mg total) under the tongue every 4 (four) hours as needed for cramping. Qty: 30 tablet, Refills: 0    insulin aspart (NOVOLOG) 100 UNIT/ML FlexPen Inject 10-16 Units into the skin See admin  instructions. 10 units with breakfast, 10 units with lunch, 16 units at dinner, >200 increase by 2 units.    insulin glargine (LANTUS) 100 UNIT/ML injection Inject 32 Units into the skin daily.     levothyroxine (SYNTHROID, LEVOTHROID) 75 MCG tablet Take 75 mcg by mouth daily before breakfast.     lisinopril (PRINIVIL,ZESTRIL) 5 MG tablet Take 1 tablet (5 mg total) by mouth daily.    metoprolol succinate (TOPROL-XL) 25 MG 24 hr tablet Take 25 mg by mouth at bedtime.    Multiple Vitamin (MULTIVITAMIN) tablet Take 1 tablet by mouth daily.  nitroGLYCERIN (NITROSTAT) 0.4 MG SL tablet Place 1 tablet (0.4 mg total) under the tongue every 5 (five) minutes as needed for chest pain. Qty: 25 tablet, Refills: 3    ondansetron (ZOFRAN) 4 MG tablet Take 1 tablet (4 mg total) by mouth every 6 (six) hours as needed for nausea. Qty: 20 tablet, Refills: 0    potassium chloride SA (K-DUR,KLOR-CON) 20 MEQ tablet Take 2 tablets (40 mEq total) by mouth daily. Qty: 60 tablet, Refills: 3    ranolazine (RANEXA) 500 MG 12 hr tablet Take 1 tablet (500 mg total) by mouth 2 (two) times daily. Qty: 90 tablet, Refills: 3    ropinirole (REQUIP) 5 MG tablet Take 5 mg by mouth See admin instructions. Takes 5mg  twice a day. Takes 5mg  midday as needed for restless legs.    temazepam (RESTORIL) 30 MG capsule Take 30 mg by mouth at bedtime.    torsemide (DEMADEX) 20 MG tablet Take 2 tablets (40 mg total) by mouth daily. Qty: 60 tablet, Refills: 5      STOP taking these medications     etodolac (LODINE) 400 MG tablet      tapentadol (NUCYNTA) 50 MG tablet      traMADol (ULTRAM) 50 MG tablet          DISCHARGE INSTRUCTIONS:    Follow in pulmonary clinic and ortho clinic in 1-2 weeks as advised.  If you experience worsening of your admission symptoms, develop shortness of breath, life threatening emergency, suicidal or homicidal thoughts you must seek medical attention immediately by calling 911 or  calling your MD immediately  if symptoms less severe.  You Must read complete instructions/literature along with all the possible adverse reactions/side effects for all the Medicines you take and that have been prescribed to you. Take any new Medicines after you have completely understood and accept all the possible adverse reactions/side effects.   Please note  You were cared for by a hospitalist during your hospital stay. If you have any questions about your discharge medications or the care you received while you were in the hospital after you are discharged, you can call the unit and asked to speak with the hospitalist on call if the hospitalist that took care of you is not available. Once you are discharged, your primary care physician will handle any further medical issues. Please note that NO REFILLS for any discharge medications will be authorized once you are discharged, as it is imperative that you return to your primary care physician (or establish a relationship with a primary care physician if you do not have one) for your aftercare needs so that they can reassess your need for medications and monitor your lab values.    Today   CHIEF COMPLAINT:  No chief complaint on file.   HISTORY OF PRESENT ILLNESS:  [name]  is a 71 y.o. female with a known history of CAD, obesity, diabetes, hyperlipidemia, obstructive sleep apnea on CPAP with questionable history of asthma/COPD admitted from PACU after patient was found to have increasing respiratory distress with hypoxia after surgery. Patient had sustained a left distal radial fracture status post fall in July 2018 and had open reduction and internal fixation of distal radial fracture with hardware that was removed today by Dr. Harlow Mares from orthopedic. Post extubation patient double up vomiting spitting up some bile and started having cough with increasing respiratory distress course respiration and hypoxia requiring Wendy mask which was  weaned down to nasal cannula oxygen when she came  on the floor. Patient's sats were 95% on 2-3 L nasal cannula. She was significantly wheezing and admitted with acute respiratory distress.   VITAL SIGNS:  Blood pressure 131/65, pulse 88, temperature 98.3 F (36.8 C), temperature source Oral, resp. rate 20, height 5\' 3"  (1.6 m), weight 99.3 kg (219 lb), SpO2 95 %.  I/O:   Intake/Output Summary (Last 24 hours) at 10/14/16 0907 Last data filed at 10/14/16 0400  Gross per 24 hour  Intake          1062.67 ml  Output                0 ml  Net          1062.67 ml    PHYSICAL EXAMINATION:  GENERAL:  71 y.o.-year-old patient lying in the bed with no acute distress.  EYES: Pupils equal, round, reactive to light and accommodation. No scleral icterus. Extraocular muscles intact.  HEENT: Head atraumatic, normocephalic. Oropharynx and nasopharynx clear.  NECK:  Supple, no jugular venous distention. No thyroid enlargement, no tenderness.  LUNGS: Normal breath sounds bilaterally, some wheezing and coarse sounds, no crepitation. No use of accessory muscles of respiration.  CARDIOVASCULAR: S1, S2 normal. No murmurs, rubs, or gallops.  ABDOMEN: Soft, non-tender, non-distended. Bowel sounds present. No organomegaly or mass.  EXTREMITIES: No pedal edema, cyanosis, or clubbing. Right forearm and wrist in cast. NEUROLOGIC: Cranial nerves II through XII are intact. Muscle strength 5/5 in all extremities. Sensation intact. Gait not checked.  PSYCHIATRIC: The patient is alert and oriented x 3.  SKIN: No obvious rash, lesion, or ulcer.   DATA REVIEW:   CBC  Recent Labs Lab 10/14/16 0429  WBC 12.2*  HGB 10.1*  HCT 29.5*  PLT 229    Chemistries   Recent Labs Lab 10/13/16 0427  NA 138  K 4.5  CL 102  CO2 29  GLUCOSE 180*  BUN 34*  CREATININE 1.12*  CALCIUM 9.3    Cardiac Enzymes  Recent Labs Lab 10/12/16 0957  TROPONINI <0.03    Microbiology Results  Results for orders placed or  performed during the hospital encounter of 10/09/16  Surgical pcr screen     Status: None   Collection Time: 10/09/16 12:38 PM  Result Value Ref Range Status   MRSA, PCR NEGATIVE NEGATIVE Final   Staphylococcus aureus NEGATIVE NEGATIVE Final    Comment: (NOTE) The Xpert SA Assay (FDA approved for NASAL specimens in patients 97 years of age and older), is one component of a comprehensive surveillance program. It is not intended to diagnose infection nor to guide or monitor treatment.     RADIOLOGY:  Dg Chest 2 View  Result Date: 10/14/2016 CLINICAL DATA:  Follow-up aspiration pneumonia involving the right lower lobe. EXAM: CHEST  2 VIEW COMPARISON:  10/12/2016, 10/11/2016 and earlier. FINDINGS: Cardiomediastinal silhouette unremarkable, unchanged. Streaky and patchy opacities in the right lower lobe, unchanged since yesterday. No new pulmonary parenchymal abnormalities. No pleural effusions. Chronic elevation of the right anterior hemidiaphragm. Degenerative changes throughout the thoracic and upper lumbar spine. IMPRESSION: 1. Stable patchy pneumonia involving the right lower lobe. 2. No new abnormalities. Electronically Signed   By: Evangeline Dakin M.D.   On: 10/14/2016 08:15    EKG:   Orders placed or performed during the hospital encounter of 10/11/16  . EKG 12-Lead  . EKG 12-Lead  . EKG 12-Lead  . EKG 12-Lead      Management plans discussed with the patient, family and they are in  agreement.  CODE STATUS:     Code Status Orders        Start     Ordered   10/11/16 1256  Full code  Continuous     10/11/16 1255    Code Status History    Date Active Date Inactive Code Status Order ID Comments User Context   10/11/2016 12:55 PM  Full Code [id number]  Fritzi Mandes, MD Inpatient   08/29/2016  4:29 PM 08/29/2016  9:07 PM Full Code [id number]  Lovell Sheehan, MD Inpatient   07/23/2016  8:25 PM 07/28/2016  5:57 PM Full Code [id number]  Hillary Bow, MD ED   06/24/2016  7:10 AM  06/25/2016  7:17 PM Full Code [id number]  Saundra Shelling, MD Inpatient   11/14/2015  6:33 PM 11/18/2015  6:51 PM Full Code [id number]  Newman Pies, MD Inpatient   09/12/2015 10:48 AM 09/13/2015  3:15 AM Full Code [id number]  Logan Bores, MD HOV    Advance Directive Documentation     Most Recent Value  Type of Advance Directive  Healthcare Power of Attorney, Living will  Pre-existing out of facility DNR order (yellow form or pink MOST form)  -  "MOST" Form in Place?  -      TOTAL TIME TAKING CARE OF THIS PATIENT: 35 minutes.    Vaughan Basta M.D on 10/14/2016 at 9:07 AM  Between 7am to 6pm - Pager - [phone]  After 6pm go to [email or web address] - password [email or web address] [facility]  Office  [phone]  CC: Primary care physician; Rusty Aus, MD   Note: This dictation was prepared with Dragon dictation along with smaller phrase technology. Any transcriptional errors that result from this process are unintentional.

## 2016-10-14 NOTE — Progress Notes (Signed)
Waiting on Neb machine for discharge.

## 2016-10-14 NOTE — Evaluation (Signed)
Physical Therapy Screen Patient Details Name: [name] MRN: [id number] DOB: 17-Jan-1946 Today's Date: 10/14/2016   History of Present Illness  Patient screened; no skilled needs identified; 71 yo Female admitted on 10/11/16 to have hardware removed from recent ORIF of LUE wrist from recent fracture in July 2018. Patient started having acute respiratory failure when coming out of anesthesia and was admitted.   Clinical Impression  Patient reports functioning at baseline; no skilled needs identified; will complete PT order;     Follow Up Recommendations      Equipment Recommendations       Recommendations for Other Services       Precautions / Restrictions Restrictions Weight Bearing Restrictions: Yes LUE Weight Bearing: Non weight bearing        Home Living Family/patient expects to be discharged to:: Private residence Living Arrangements: Spouse/significant other (he is gone during week; sister and neice live next door; ) Available Help at Discharge: Family;Available PRN/intermittently Type of Home: House Home Access: Stairs to enter Entrance Stairs-Rails: None Entrance Stairs-Number of Steps: 2/1; no rails but uses columns on portch to hold when negotiating steps;  Home Layout: One level Home Equipment: Walker - 2 wheels;Cane - single point;Wheelchair - [employer];Shower seat      Prior Function Level of Independence: Independent with assistive device(s)         Comments: reports being able to cook for self and is mod I for bathing/dressing; Reports that she has walked around room without assistive device without loss of balance. Spoke with RN, who reports no concern for going home. Screened for mobility, no skilled needs identified;      Communication   Communication: No difficulties  Time in Room: 10:50-10:55; screen no skilled needs identified;  Trotter,Margaret PT, DPT 10/14/2016, 11:08 AM

## 2016-10-14 NOTE — Care Management Note (Signed)
Case Management Note  Patient Details  Name: ZURII HEWES MRN: [id number] Date of Birth: 16-Oct-1945  Subjective/Objective:     Call to Melene Muller at [facility] requesting delivery of a home use nebulizer machine to Mrs Dominik in room 135 today.                Action/Plan:   Expected Discharge Date:  10/14/16               Expected Discharge Plan:     In-House Referral:     Discharge planning Services  CM Consult  Post Acute Care Choice:    Choice offered to:     DME Arranged:    DME Agency:     HH Arranged:    HH Agency:     Status of Service:  In process, will continue to follow  If discussed at Long Length of Stay Meetings, dates discussed:    Additional Comments:  Zachery Niswander A, RN 10/14/2016, 9:56 AM

## 2016-10-17 NOTE — Op Note (Signed)
10/11/2016  12:13 PM  PATIENT:  [name]  71 y.o. female  PRE-OPERATIVE DIAGNOSIS:  S52.532D Colles' fx left radius, subs for clos fx w routn heal  POST-OPERATIVE DIAGNOSIS:  S52.532D Colles' fx left radius, subs for clos fx w routn heal  PROCEDURE:  Procedure(s) with comments: HARDWARE REMOVAL-LEFT RADIUS (Left) - Left Radius Wrist   SURGEON:  Surgeon(s) and Role:    * Lovell Sheehan, MD - Primary  PHYSICIAN ASSISTANT: Wyatt Portela, PA-C  ASSISTANTS: none   ANESTHESIA:   regional and general  EBL:  No intake/output data recorded.  BLOOD ADMINISTERED:none  DRAINS: none   SPECIMEN:  No Specimen  COUNTS:  YES  TOURNIQUET:   Total Tourniquet Time Documented: Upper Arm (Left) - 9 minutes Total: Upper Arm (Left) - 9 minutes  PREOPERATIVE INDICATIONS: Patient presented with increasing pain and stiffness 6 weeks after placement of a wrist bridge plate. The fracture showed x-ray evidence of healing, therefore the painful hardware was scheduled for removal.  The risks benefits and alternatives were discussed with the patient preoperatively including but not limited to the risks of infection, bleeding, nerve injury, malunion, nonunion, wrist stiffness, persistent wrist pain, osteoarthritis and the need for further surgery. Medical risks include but are not limited to DVT and pulmonary embolism, myocardial infarction, stroke, pneumonia, respiratory failure and death. Patient  understood these risks and wished to proceed.   OPERATIVE EXPLANTS:  Synthes wrist spanning plate and 6 screws  OPERATIVE FINDINGS: Comminuted, displaced, intra-articular fracture of the distal radius with more than 3 major fragments. Stable fracture with abundant callous.  OPERATIVE PROCEDURE: Patient was seen in the preoperative area. I marked the operative wrist according tl the hospital's correct site of surgery protocol. Patient was then brought to the operating roomand was placed supine on the  operative table and underwent general anesthesia with an LMA.   The operative arm was prepped and draped in a sterile fashion. A timeout performed to verify the patient's name, date of birth, medical record number, correct site of surgery correct procedure to be performed. The timeout was also used a timeout to verify patient received antibiotics and appropriate instruments, implants and radiographs studies were available in the room. Once all in attendance were in agreement case began.   Patient then had the operative extremity exsanguinated with an Esmarch. The tourniquet was placed on the upper extremity and inflated to 250 mm Hg.  We began by making incision over the 2nd metacarpal. Dissection was carried down through skin and subcutaneous tissue and bipolar cautery was used for hemostasis. We gently retracted the extensor tendons in order to expose the plate. The three screws were removed without difficulty.   We then opened the proximal incision and retracted the extensor muscle and tendon to expose the plate. The proximal three screws were removed. The plate was then delivered out the distal incision and passed from the field. Copious irrigation was used throughout both incisions.  We then brought fluoroscopy back and x-rays were taken which confirmed excellent fracture alignment and stabiliy. All of the hardware had been removed. The tourniquet was released hemostasis was obtained.   The skin was closed with nylon. Xeroform and a dry sterile dressing were applied along with a volar splint. I was scrubbed and present for the entire case and all sharp and instrument counts were correct at the conclusion the case. The patient tolerated this procedure well and was awakened and taken to the recovery room in good condition.  Elyn Aquas. Harlow Mares, MD

## 2016-10-19 ENCOUNTER — Other Ambulatory Visit: Payer: Self-pay | Admitting: Pharmacist

## 2016-10-19 MED ORDER — ALIROCUMAB 150 MG/ML ~~LOC~~ SOPN: 1.0000 "pen " | PEN_INJECTOR | SUBCUTANEOUS | 3 refills | Status: DC

## 2016-10-30 ENCOUNTER — Other Ambulatory Visit: Payer: Self-pay | Admitting: Cardiovascular Disease

## 2016-11-06 ENCOUNTER — Other Ambulatory Visit: Payer: Self-pay | Admitting: Specialist

## 2016-11-06 DIAGNOSIS — J986 Disorders of diaphragm: Secondary | ICD-10-CM

## 2016-11-09 ENCOUNTER — Ambulatory Visit
Admission: RE | Admit: 2016-11-09 | Discharge: 2016-11-09 | Disposition: A | Discharge status: Home / Self Care | Payer: Medicare Other | Source: Ambulatory Visit | Attending: Specialist | Admitting: Specialist

## 2016-11-09 DIAGNOSIS — J986 Disorders of diaphragm: Secondary | ICD-10-CM | POA: Diagnosis present

## 2016-11-13 ENCOUNTER — Encounter: Payer: Self-pay | Admitting: Cardiovascular Disease

## 2016-11-13 ENCOUNTER — Encounter: Payer: Self-pay | Admitting: Family

## 2016-11-14 ENCOUNTER — Telehealth: Payer: Self-pay | Admitting: Family

## 2016-11-14 NOTE — Telephone Encounter (Signed)
Called patient regarding edema, weight gain and increased heart rate. Patient says that she has gained 5 pounds in the last week or so. Weight today is 222 pounds and on 11/01/16 she weighed 217 pounds. Continues to have edema in her legs that doesn't go down along with an elevated HR into the 140's at times. Does experience shortness of breath upon exertion but breathes well at rest.  Currently she is taking torsemide 40mg  daily along with potassium 1meq daily. Advised her to increase her torsemide to 40mg  twice daily along with increased potassium of 62meq twice daily for the next 3 days.   Advised patient that I would like for someone to see her prior to the weekend to make sure she doesn't have fluid in her lungs. Offered to make her an appointment tomorrow as we are closed on Friday 11/16/16. She says that she will call her cardiologist to see if they can see her on the 5th. If they can't she will call us back for an appointment tomorrow afternoon.

## 2016-11-14 NOTE — Progress Notes (Signed)
Cardiology Office Note  Date:  11/15/2016   ID:  JELENE ALBANO, DOB 1945/02/19, MRN [id number]  PCP:  Rusty Aus, MD   Chief Complaint  Patient presents with  . other    Swelling, weakness and sob. Pt would like to discuss Torsemide. Meds reviewed verbally with pt.    HPI:  71 year old woman with  CAD,  cardiac catheterization in 2009 and June 2013, moderate LAD, diagonal and RCA disease, Obesity,  diabetes,  hyperlipidemia with statin intolerance, on praluent obstructive sleep apnea on CPAP,  Tachycardia episodes,  previous leg edema,  seen in the hospital for severe hypertension and chest pain,  who presents for routine followup of her coronary artery disease and diastolic CHF  Recent fall with left wrist fracture, required placement of plate Thin ligament injury after moving her car seat  Completed a tendon transfer  Following anesthesia developed pneumonia, longer hospital stay  Hospital records reviewed with the patient in detail Feels deconditioned, weak, tired all the time    reports having episodes of chest pain Now back on ranexa 500 BID Symptoms seem to have improved   also reports Having tachycardia, acute tachycardia, rate to 140, afternoon, early evening Takes metoprolol succinate in the evening  Diuretic recently increased up to torsemide 80 mg in the morning Has not really started this yet, possibly one day Significant leg swelling, weight gain, abdominal fullness, shortness of breath  EKG personally reviewed by myself on todays visit Shows normal sinus rhythm rate 95 bpm no significant ST or T-wave changes   Other past medical history reviewed  Episode of pyelonephritis May 2018 Back in the hospital June 6283 with diastolic CHF, shortness of breath Also with bronchitis requiring steroids [place] eating out every [place], had significant weight gain and was not taking Lasix  In follow-up she is taking Lasix 20 mg daily, sometimes  40 mg for ankle swelling or weight gain  Suffered a fall resulting in Left wrist fracture, required a placement of plate Then ligamental injury after moving her car seat Reports she needs a tendon transfer  Dry weight 218 here, At home 219  Lab work reviewed with her HBa1C 6.2 Total cholesterol down to 170, previously 230s She is taking praluentThis is up for review per the patient.   EKG personally reviewed by myself on todays visit Shows normal sinus rhythm with rate 85 bpm no significant ST or T-wave changes  Other past medical history reviewed Had back surgery 11/14/2015 Went to rehab Got postop infection, ecoli, sepsis crp 30, sed rate 130 Temp in October 2017 Drain placed 02/15/2016, rare ecoli on ABX, picc line in place Still with significant back pain No fevers,  Insulin has been increased.  Other past medical history using her CPAP. Previous blood work before praluent, total cholesterol 238, triglycerides 462, hemoglobin A1c 7.0 She was unable to tolerate Vytorin as well as other statins  Admission on October 1, discharge on November 14 2010. She ruled out by cardiac enzymes, chest CT was negative for PE. She had a stress test that showed no ischemia. Her medications were adjusted. CT Scan did show fatty infiltration of the liver, granulomatous lesion of the left lower lobe  cardiac catheterization 07/25/2011 for chest pain  This showed left dominant coronary system with moderate mid LAD, proximal diagonal #1 and proximal RCA disease all estimated at 50%, normal LV systolic function.  PMH:   has a past medical history of Anginal pain ([place]); Anxiety;  Arthritis; CHF (congestive heart failure) ([place]); Chronic back pain; Constipation; Coronary artery disease; Depression; Diabetes mellitus; Diverticulosis; E coli infection; GERD (gastroesophageal reflux disease); Headache; Headache (02/16/2016); Hemorrhoids; History of colon polyps; History of gout; History of hiatal hernia;  History of vertigo; Hyperlipidemia; Hypertension; Hypothyroidism; Insomnia; Joint pain; Joint swelling; Muscle spasm; NSVT (nonsustained ventricular tachycardia) ([place]); OSA on CPAP; Periodic heart flutter; Peripheral edema; Peripheral vascular disease ([place]); Restless leg; Rosacea; Sleep apnea; Urinary incontinence; Urinary urgency; Varicose veins; and Weakness.  PSH:    Past Surgical History:  Procedure Laterality Date  . ABDOMINAL HYSTERECTOMY     with BSo  . CARDIAC CATHETERIZATION  2013   Normal  . CARPAL TUNNEL RELEASE Bilateral   . CATARACT EXTRACTION, BILATERAL    . CHOLECYSTECTOMY    . COLONOSCOPY    . DIAGNOSTIC LAPAROSCOPY     multiple times  . DILATION AND CURETTAGE OF UTERUS    . ESOPHAGOGASTRODUODENOSCOPY (EGD) WITH PROPOFOL N/A 11/01/2014   Procedure: ESOPHAGOGASTRODUODENOSCOPY (EGD) WITH PROPOFOL;  Surgeon: Hulen Luster, MD;  Location[facility] ENDOSCOPY;  Service: Gastroenterology;  Laterality: N/A;  . HARDWARE REMOVAL Left 10/11/2016   Procedure: HARDWARE REMOVAL-LEFT RADIUS;  Surgeon: Lovell Sheehan, MD;  Location: [facility] ORS;  Service: Orthopedics;  Laterality: Left;  Left Radius Wrist   . IMPLANTABLE CONTACT LENS IMPLANTATION     bilateral  . LAMINECTOMY  11/13/2015  . LUMBAR FUSION  11/2015  . LUMBAR WOUND DEBRIDEMENT N/A 12/02/2015   Procedure: WOUND Exploration;  Surgeon: Consuella Lose, MD;  Location: [place];  Service: Neurosurgery;  Laterality: N/A;  . OPEN REDUCTION INTERNAL FIXATION (ORIF) DISTAL RADIAL FRACTURE Left 08/29/2016   Procedure: OPEN REDUCTION INTERNAL FIXATION (ORIF) DISTAL RADIAL FRACTURE;  Surgeon: Lovell Sheehan, MD;  Location: [facility] ORS;  Service: Orthopedics;  Laterality: Left;  . PICC LINE PLACE PERIPHERAL ([place] HX)     right upper arm   . ROTATOR CUFF REPAIR Left   . SAVORY DILATION N/A 11/01/2014   Procedure: SAVORY DILATION;  Surgeon: Hulen Luster, MD;  Location: [facility] ENDOSCOPY;  Service: Gastroenterology;  Laterality: N/A;  . TONSILLECTOMY    .  TRIGGER FINGER RELEASE Bilateral     Current Outpatient Prescriptions  Medication Sig Dispense Refill  . albuterol (PROVENTIL HFA;VENTOLIN HFA) 108 (90 Base) MCG/ACT inhaler Inhale 2 puffs into the lungs every 6 (six) hours as needed for wheezing or shortness of breath. 1 Inhaler 0  . Alirocumab (PRALUENT) 150 MG/ML SOPN Inject 1 pen into the skin every 14 (fourteen) days. 6 pen 3  . aspirin 81 MG tablet Take 81 mg by mouth at bedtime.     . Azelastine HCl (ASTEPRO) 0.15 % SOLN Place 2 sprays into both nostrils at bedtime.     . Cholecalciferol (VITAMIN D) 2000 units tablet Take 2,000 Units by mouth daily.    [name] desonide (DESOWEN) 0.05 % lotion Apply 1 application topically daily.    [name] esomeprazole (NEXIUM) 40 MG capsule Take 40 mg by mouth 3 (three) times daily before meals.     . gabapentin (NEURONTIN) 300 MG capsule Take 300 mg by mouth 3 (three) times daily.     . hyoscyamine (LEVSIN SL) 0.125 MG SL tablet Place 2 tablets (0.25 mg total) under the tongue every 4 (four) hours as needed for cramping. 30 tablet 0  . insulin aspart (NOVOLOG) 100 UNIT/ML FlexPen Inject 10-16 Units into the skin See admin instructions. 10 units with breakfast, 10 units with lunch, 16 units at dinner, >200 increase  by 2 units.    . insulin glargine (LANTUS) 100 UNIT/ML injection Inject 32 Units into the skin daily.     [name] ipratropium-albuterol (DUONEB) 0.5-2.5 (3) MG/3ML SOLN Take 3 mLs by nebulization every 6 (six) hours. 360 mL 0  . levothyroxine (SYNTHROID, LEVOTHROID) 75 MCG tablet Take 75 mcg by mouth daily before breakfast.     . lisinopril (PRINIVIL,ZESTRIL) 5 MG tablet Take 1 tablet (5 mg total) by mouth daily. (Patient taking differently: Take 5 mg by mouth at bedtime. )    . metoprolol succinate (TOPROL-XL) 25 MG 24 hr tablet Take 1 tablet (25 mg total) by mouth 2 (two) times daily. 180 tablet 3  . Multiple Vitamin (MULTIVITAMIN) tablet Take 1 tablet by mouth daily.    . nitroGLYCERIN (NITROSTAT) 0.4 MG SL  tablet Place 1 tablet (0.4 mg total) under the tongue every 5 (five) minutes as needed for chest pain. 25 tablet 3  . ondansetron (ZOFRAN) 4 MG tablet Take 1 tablet (4 mg total) by mouth every 6 (six) hours as needed for nausea. 20 tablet 0  . oxyCODONE (ROXICODONE) 5 MG immediate release tablet Take 1 tablet (5 mg total) by mouth every 6 (six) hours as needed. 30 tablet 0  . potassium chloride SA (K-DUR,KLOR-CON) 20 MEQ tablet Take 1 tablet (20 mEq total) by mouth 3 (three) times daily. 270 tablet 3  . ranolazine (RANEXA) 1000 MG SR tablet Take 1 tablet (1,000 mg total) by mouth 2 (two) times daily. 180 tablet 3  . ropinirole (REQUIP) 5 MG tablet Take 5 mg by mouth See admin instructions. Takes 5mg  twice a day. Takes 5mg  midday as needed for restless legs.    . temazepam (RESTORIL) 30 MG capsule Take 30 mg by mouth at bedtime.    . torsemide (DEMADEX) 20 MG tablet Take 3 tablets (60 mg total) by mouth 2 (two) times daily. 540 tablet 3   No current facility-administered medications for this visit.    Facility-Administered Medications Ordered in Other Visits  Medication Dose Route Frequency Provider Last Rate Last Dose  . dexamethasone (DECADRON) injection 10 mg  10 mg Intravenous Once Lovell Sheehan, MD         Allergies:   Ceftin [cefuroxime axetil]; Codeine; Penicillins; and Vicodin [hydrocodone-acetaminophen]   Social History:  The patient  reports that she has never smoked. She has never used smokeless tobacco. She reports that she does not drink alcohol or use drugs.   Family History:   family history includes Breast cancer (age of onset: 37) in her mother; Heart attack in her sister; Heart disease in her brother, father, mother, and sister.    Review of Systems: Review of Systems  Constitutional: Negative.        Weight gain  Respiratory: Positive for shortness of breath.   Cardiovascular: Positive for leg swelling.  Gastrointestinal: Negative.   Musculoskeletal: Positive for  joint pain.       Left wrist pain  Neurological: Negative.   Psychiatric/Behavioral: Negative.   All other systems reviewed and are negative.    PHYSICAL EXAM: VS:  BP (!) 110/58 (BP Location: Left Arm, Patient Position: Sitting, Cuff Size: Large)   Pulse 95   Ht 5\' 1"  (1.549 m)   Wt 225 lb (102.1 kg)   BMI 42.51 kg/m  , BMI Body mass index is 42.51 kg/m. GEN: Well nourished, well developed, in no acute distress, obese HEENT: normal  Neck: no JVD, carotid bruits, or masses Cardiac: RRR; no murmurs,  rubs, or gallops, trace pitting lower extremity  edema  Respiratory:  clear to auscultation bilaterally, normal work of breathing GI: soft, nontender, nondistended, + BS MS: no deformity or atrophy  Skin: warm and dry, no rash Neuro:  Strength and sensation are intact Psych: euthymic mood, full affect   Recent Labs: 06/23/2016: ALT 21 07/25/2016: B Natriuretic Peptide 68.0 07/27/2016: Magnesium 2.1 10/13/2016: BUN 34; Creatinine, Ser 1.12; Potassium 4.5; Sodium 138 10/14/2016: Hemoglobin 10.1; Platelets 229    Lipid Panel Lab Results  Component Value Date   CHOL 163 07/26/2015   HDL 32 (L) 07/26/2015   LDLCALC 94 07/26/2015   TRIG 187 (H) 07/26/2015      Wt Readings from Last 3 Encounters:  11/15/16 225 lb (102.1 kg)  10/11/16 219 lb (99.3 kg)  10/03/16 218 lb 12 oz (99.2 kg)       ASSESSMENT AND PLAN:  Coronary artery disease involving native coronary artery of native heart without angina pectoris - Plan: EKG 12-Lead Currently with no symptoms of angina. No further workup at this time. Continue current medication regimen.  Essential hypertension Blood pressure is well controlled on today's visit. No changes made to the medications. Stable   Mixed hyperlipidemia On praluent 150 mg, every two weeks Dramatic improvement in her cholesterol numbers,  We'll continue current dosing  Tachycardia Denies any sx, no changes to meds.  Chronic diastolic CHF   dry weight  218 pounds. Current weight 225 pounds  Not responding to lower dose torsemide Recommended she try torsemide 40 mg twice a day If no improvement in her symptoms over the next week could potentially try 60 mg twice a day Of last resort would be adding metolazone 2.5 mg once a week  Long discussion concerning diet, eating out, low salt and fluid intake   Type 2 diabetes mellitus with other circulatory complication, with long-term current use of insulin (HCC) Difficult time over the past several months with hospitalizations, Weight trending up Unable to do her exercise program  OSA on CPAP Tolerated CPAP   Total encounter time more than 45 minutes  Greater than 50% was spent in counseling and coordination of care with the patient   Disposition:   F/U  6 months   Orders Placed This Encounter  Procedures  . EKG 12-Lead     Signed, Esmond Plants, M.D., Ph.D. 11/15/2016  [facility] [place], [place]

## 2016-11-15 ENCOUNTER — Ambulatory Visit (INDEPENDENT_AMBULATORY_CARE_PROVIDER_SITE_OTHER): Payer: Medicare Other | Admitting: Cardiovascular Disease

## 2016-11-15 ENCOUNTER — Encounter: Payer: Self-pay | Admitting: Cardiovascular Disease

## 2016-11-15 VITALS — BP 110/58 | HR 95 | Ht 61.0 in | Wt 225.0 lb

## 2016-11-15 DIAGNOSIS — I25118 Atherosclerotic heart disease of native coronary artery with other forms of angina pectoris: Secondary | ICD-10-CM | POA: Diagnosis not present

## 2016-11-15 DIAGNOSIS — J9601 Acute respiratory failure with hypoxia: Secondary | ICD-10-CM | POA: Diagnosis not present

## 2016-11-15 DIAGNOSIS — Z794 Long term (current) use of insulin: Secondary | ICD-10-CM

## 2016-11-15 DIAGNOSIS — I209 Angina pectoris, unspecified: Secondary | ICD-10-CM

## 2016-11-15 DIAGNOSIS — I5032 Chronic diastolic (congestive) heart failure: Secondary | ICD-10-CM | POA: Diagnosis not present

## 2016-11-15 DIAGNOSIS — I2510 Atherosclerotic heart disease of native coronary artery without angina pectoris: Secondary | ICD-10-CM

## 2016-11-15 DIAGNOSIS — E782 Mixed hyperlipidemia: Secondary | ICD-10-CM

## 2016-11-15 DIAGNOSIS — I10 Essential (primary) hypertension: Secondary | ICD-10-CM

## 2016-11-15 DIAGNOSIS — E1159 Type 2 diabetes mellitus with other circulatory complications: Secondary | ICD-10-CM

## 2016-11-15 DIAGNOSIS — R600 Localized edema: Secondary | ICD-10-CM | POA: Diagnosis not present

## 2016-11-15 MED ORDER — TORSEMIDE 20 MG PO TABS: 60.0000 mg | ORAL_TABLET | Freq: Two times a day (BID) | ORAL | 3 refills | Status: DC

## 2016-11-15 MED ORDER — METOPROLOL SUCCINATE ER 25 MG PO TB24: 25.0000 mg | ORAL_TABLET | Freq: Two times a day (BID) | ORAL | 3 refills | Status: DC

## 2016-11-15 MED ORDER — POTASSIUM CHLORIDE CRYS ER 20 MEQ PO TBCR: 20.0000 meq | EXTENDED_RELEASE_TABLET | Freq: Three times a day (TID) | ORAL | 3 refills | Status: DC

## 2016-11-15 MED ORDER — RANOLAZINE ER 1000 MG PO TB12: 1000.0000 mg | ORAL_TABLET | Freq: Two times a day (BID) | ORAL | 3 refills | Status: DC

## 2016-11-15 NOTE — Patient Instructions (Addendum)
Medication Instructions:   Increase the metoprolol up to one pill twice a day  Consider increasing the ranexa up to 1000 mg twice a day  Medication Samples have been provided to the patient.  Drug name: Ranexa       Strength: 1000 mg        Qty: 4 boxes  LOT: [id number]  Exp.Date: 5/20   Try torsemide 40 twice a day  (8 and and 2 pm) You can try torsemide 60 twice a day if no relief Go back to lower dose torsemide once swelling gone  Labwork:  No new labs needed  Testing/Procedures:  No further testing at this time   Follow-Up: It was a pleasure seeing you in the office today. Please call us if you have new issues that need to be addressed before your next appt.  [phone]  Your physician wants you to follow-up in: 6 months.  You will receive a reminder letter in the mail two months in advance. If you don't receive a letter, please call our office to schedule the follow-up appointment.  If you need a refill on your cardiac medications before your next appointment, please call your pharmacy.

## 2016-12-17 ENCOUNTER — Ambulatory Visit: Payer: Medicare Other | Attending: Family | Admitting: Family

## 2016-12-17 ENCOUNTER — Encounter: Payer: Self-pay | Admitting: Family

## 2016-12-17 ENCOUNTER — Telehealth: Payer: Self-pay | Admitting: Family

## 2016-12-17 ENCOUNTER — Other Ambulatory Visit: Payer: Self-pay

## 2016-12-17 VITALS — BP 130/56 | HR 85 | Resp 18 | Ht 63.0 in | Wt 222.4 lb

## 2016-12-17 DIAGNOSIS — Z90722 Acquired absence of ovaries, bilateral: Secondary | ICD-10-CM | POA: Insufficient documentation

## 2016-12-17 DIAGNOSIS — R42 Dizziness and giddiness: Secondary | ICD-10-CM | POA: Insufficient documentation

## 2016-12-17 DIAGNOSIS — G4700 Insomnia, unspecified: Secondary | ICD-10-CM | POA: Insufficient documentation

## 2016-12-17 DIAGNOSIS — Z881 Allergy status to other antibiotic agents status: Secondary | ICD-10-CM | POA: Insufficient documentation

## 2016-12-17 DIAGNOSIS — Z7982 Long term (current) use of aspirin: Secondary | ICD-10-CM | POA: Insufficient documentation

## 2016-12-17 DIAGNOSIS — M109 Gout, unspecified: Secondary | ICD-10-CM | POA: Insufficient documentation

## 2016-12-17 DIAGNOSIS — I110 Hypertensive heart disease with heart failure: Secondary | ICD-10-CM | POA: Diagnosis not present

## 2016-12-17 DIAGNOSIS — E1151 Type 2 diabetes mellitus with diabetic peripheral angiopathy without gangrene: Secondary | ICD-10-CM | POA: Diagnosis not present

## 2016-12-17 DIAGNOSIS — F329 Major depressive disorder, single episode, unspecified: Secondary | ICD-10-CM | POA: Insufficient documentation

## 2016-12-17 DIAGNOSIS — Z90710 Acquired absence of both cervix and uterus: Secondary | ICD-10-CM | POA: Diagnosis not present

## 2016-12-17 DIAGNOSIS — I10 Essential (primary) hypertension: Secondary | ICD-10-CM

## 2016-12-17 DIAGNOSIS — I890 Lymphedema, not elsewhere classified: Secondary | ICD-10-CM | POA: Diagnosis not present

## 2016-12-17 DIAGNOSIS — E785 Hyperlipidemia, unspecified: Secondary | ICD-10-CM | POA: Insufficient documentation

## 2016-12-17 DIAGNOSIS — Z9049 Acquired absence of other specified parts of digestive tract: Secondary | ICD-10-CM | POA: Insufficient documentation

## 2016-12-17 DIAGNOSIS — G4733 Obstructive sleep apnea (adult) (pediatric): Secondary | ICD-10-CM | POA: Insufficient documentation

## 2016-12-17 DIAGNOSIS — I714 Abdominal aortic aneurysm, without rupture: Secondary | ICD-10-CM | POA: Insufficient documentation

## 2016-12-17 DIAGNOSIS — F419 Anxiety disorder, unspecified: Secondary | ICD-10-CM | POA: Insufficient documentation

## 2016-12-17 DIAGNOSIS — K219 Gastro-esophageal reflux disease without esophagitis: Secondary | ICD-10-CM | POA: Insufficient documentation

## 2016-12-17 DIAGNOSIS — Z79899 Other long term (current) drug therapy: Secondary | ICD-10-CM | POA: Diagnosis not present

## 2016-12-17 DIAGNOSIS — Z794 Long term (current) use of insulin: Secondary | ICD-10-CM | POA: Insufficient documentation

## 2016-12-17 DIAGNOSIS — Z86010 Personal history of colonic polyps: Secondary | ICD-10-CM | POA: Insufficient documentation

## 2016-12-17 DIAGNOSIS — E039 Hypothyroidism, unspecified: Secondary | ICD-10-CM | POA: Insufficient documentation

## 2016-12-17 DIAGNOSIS — L719 Rosacea, unspecified: Secondary | ICD-10-CM | POA: Diagnosis not present

## 2016-12-17 DIAGNOSIS — I5032 Chronic diastolic (congestive) heart failure: Secondary | ICD-10-CM | POA: Diagnosis present

## 2016-12-17 DIAGNOSIS — G8929 Other chronic pain: Secondary | ICD-10-CM | POA: Insufficient documentation

## 2016-12-17 DIAGNOSIS — M1990 Unspecified osteoarthritis, unspecified site: Secondary | ICD-10-CM | POA: Insufficient documentation

## 2016-12-17 DIAGNOSIS — Z880 Allergy status to penicillin: Secondary | ICD-10-CM | POA: Diagnosis not present

## 2016-12-17 DIAGNOSIS — K5900 Constipation, unspecified: Secondary | ICD-10-CM | POA: Insufficient documentation

## 2016-12-17 DIAGNOSIS — Z885 Allergy status to narcotic agent status: Secondary | ICD-10-CM | POA: Insufficient documentation

## 2016-12-17 DIAGNOSIS — G2581 Restless legs syndrome: Secondary | ICD-10-CM | POA: Insufficient documentation

## 2016-12-17 DIAGNOSIS — M549 Dorsalgia, unspecified: Secondary | ICD-10-CM | POA: Insufficient documentation

## 2016-12-17 DIAGNOSIS — Z803 Family history of malignant neoplasm of breast: Secondary | ICD-10-CM | POA: Insufficient documentation

## 2016-12-17 DIAGNOSIS — Z8249 Family history of ischemic heart disease and other diseases of the circulatory system: Secondary | ICD-10-CM | POA: Diagnosis not present

## 2016-12-17 DIAGNOSIS — I2510 Atherosclerotic heart disease of native coronary artery without angina pectoris: Secondary | ICD-10-CM | POA: Insufficient documentation

## 2016-12-17 DIAGNOSIS — Z981 Arthrodesis status: Secondary | ICD-10-CM | POA: Diagnosis not present

## 2016-12-17 DIAGNOSIS — E119 Type 2 diabetes mellitus without complications: Secondary | ICD-10-CM

## 2016-12-17 DIAGNOSIS — M62838 Other muscle spasm: Secondary | ICD-10-CM | POA: Insufficient documentation

## 2016-12-17 LAB — BASIC METABOLIC PANEL
ANION GAP: 7 (ref 5–15)
BUN: 23 mg/dL — ABNORMAL HIGH (ref 6–20)
CHLORIDE: 102 mmol/L (ref 101–111)
CO2: 31 mmol/L (ref 22–32)
Calcium: 9.3 mg/dL (ref 8.9–10.3)
Creatinine, Ser: 1.25 mg/dL — ABNORMAL HIGH (ref 0.44–1.00)
GFR calc non Af Amer: 42 mL/min — ABNORMAL LOW (ref >60)
GFR, EST AFRICAN AMERICAN: 49 mL/min — AB (ref >60)
Glucose, Bld: 185 mg/dL — ABNORMAL HIGH (ref 65–99)
POTASSIUM: 4.1 mmol/L (ref 3.5–5.1)
Sodium: 140 mmol/L (ref 135–145)

## 2016-12-17 NOTE — Progress Notes (Signed)
Patient ID: [name], female    DOB: 07-Jul-1945, 71 y.o.   MRN: [id number]  HPI  [name] is a 71 y/o female with a history of anxiety, CAD, depression, DM, GERD, gout, hyperlipidemia, HTN, hypothyroidism, obstructive sleep apnea (CPAP), PVD, NSVT and chronic heart failure.   Reviewed last echo report from 07/26/16 which showed an EF of 60-65%.  Admitted 10/11/16 due to COPD flare and aspiration pneumonia. Was given IV solu-medrol, nebulizers and incentive spirometer. Initially needed oxygen and then transitioned to room air. Discharged home after 3 days. Admitted for same day surgery on 08/29/16 for ORIF of left radial fracture. Was in the ED 08/23/16 after a mechanical fall in her garage. Diagnosed with radial fracture. Discharged with orthopaedic follow-up the next day.  Admitted 07/23/16 due to COPD/HF exacerbation. Initially needed bipap and then transitioned to room air. Nebulizers PRN along with IV steroids. Initially needed IV diuretics. Cardiology consult obtained. Discharged home after 5 days with home health nursing and physical therapy. Admitted 06/24/16 with left lower quadrant abdominal pain. Admitted overnight and diagnosed with abdominal spasms and discharged with levsin wit PCP follow-up.   She presents today for a follow-up visit with a chief complaint of edema in her lower extremities. She describes this as chronic in nature having been present for several years with various levels of severity. She has associated fatigue, shortness of breath and light-headedness. She denies any chest pain, wheezing, palpitations, difficulty sleeping or weight gain. She has been taking 40mg  torsemide daily along with 60meq potassium daily. She stopped taking her lisinopril on her own because of her blood pressure.   Past Medical History:  Diagnosis Date  . Anginal pain ([place])   . Anxiety   . Arthritis   . CHF (congestive heart failure) ([place])   . Chronic back pain    stenosis.degenerative  disc,some scoliosis  . Constipation    takes Stool Softener daily  . Coronary artery disease   . Depression    takes Cymbalta daily  . Diabetes mellitus    Type 2 diabetic. Average fasting blood sugar runs high 170-200  . Diverticulosis   . E coli infection   . GERD (gastroesophageal reflux disease)    takes Nexium daily  . Headache   . Headache 02/16/2016  . Hemorrhoids   . History of colon polyps    benign  . History of gout    doesn't take any meds  . History of hiatal hernia   . History of vertigo    doesn't take any meds  . Hyperlipidemia    takes Praluent daily  . Hypertension    currently BP medications are on hold   . Hypothyroidism    takes Synthroid daily  . Insomnia    takes Restoril nightly  . Joint pain   . Joint swelling   . Muscle spasm    takes Robaxin as needed  . NSVT (nonsustained ventricular tachycardia) ([place])   . OSA on CPAP   . Periodic heart flutter   . Peripheral edema    takes LAsix as needed  . Peripheral vascular disease ([place])    AAA as stated per pt / was just discovered and pt states has not been referred to vascular MD   . Restless leg    takes Requip daily  . Rosacea   . Sleep apnea   . Urinary incontinence   . Urinary urgency   . Varicose veins   . Weakness    numbness  and tingling.   Past Surgical History:  Procedure Laterality Date  . ABDOMINAL HYSTERECTOMY     with BSo  . CARDIAC CATHETERIZATION  2013   Normal  . CARPAL TUNNEL RELEASE Bilateral   . CATARACT EXTRACTION, BILATERAL    . CHOLECYSTECTOMY    . COLONOSCOPY    . DIAGNOSTIC LAPAROSCOPY     multiple times  . DILATION AND CURETTAGE OF UTERUS    . IMPLANTABLE CONTACT LENS IMPLANTATION     bilateral  . LAMINECTOMY  11/13/2015  . LUMBAR FUSION  11/2015  . PICC LINE PLACE PERIPHERAL ([place] [facility])     right upper arm   . ROTATOR CUFF REPAIR Left   . TONSILLECTOMY    . TRIGGER FINGER RELEASE Bilateral    Family History  Problem Relation Age of Onset  . Heart  disease Mother   . Breast cancer Mother 75  . Heart disease Father   . Heart attack Sister   . Heart disease Sister   . Heart disease Brother    Social History   Tobacco Use  . Smoking status: Never Smoker  . Smokeless tobacco: Never Used  Substance Use Topics  . Alcohol use: No   Allergies  Allergen Reactions  . Ceftin [Cefuroxime Axetil] Diarrhea  . Codeine Rash       . Penicillins Rash    Has patient had a PCN reaction causing immediate rash, facial/tongue/throat swelling, SOB or lightheadedness with hypotension: NO Has patient had a PCN reaction causing severe rash involving mucus membranes or skin necrosis: NO Has patient had a PCN retioion that required hospitalization NO Has patient had a PCN reaction occurring within the last 10 years: NO If all of the above answers are "NO", then may proceed with Cephalosporin use.  . Vicodin [Hydrocodone-Acetaminophen] Other (See Comments)    passes out   Prior to Admission medications   Medication Sig Start Date End Date Taking? Authorizing Provider  albuterol (PROVENTIL HFA;VENTOLIN HFA) 108 (90 Base) MCG/ACT inhaler Inhale 2 puffs into the lungs every 6 (six) hours as needed for wheezing or shortness of breath. 07/28/16  Yes Sudini, Alveta Heimlich, MD  Alirocumab (PRALUENT) 150 MG/ML SOPN Inject 1 pen into the skin every 14 (fourteen) days. 10/19/16  Yes Minna Merritts, MD  aspirin 81 MG tablet Take 81 mg by mouth at bedtime.    Yes [provider]  Azelastine HCl (ASTEPRO) 0.15 % SOLN Place 2 sprays into both nostrils at bedtime.    Yes [provider]  Cholecalciferol (VITAMIN D) 2000 units tablet Take 2,000 Units by mouth daily.   Yes [provider]  desonide (DESOWEN) 0.05 % lotion Apply 1 application topically daily.   Yes [provider]  esomeprazole (NEXIUM) 40 MG capsule Take 40 mg by mouth 3 (three) times daily before meals.    Yes [provider]  gabapentin (NEURONTIN) 300 MG capsule  Take 300 mg by mouth 3 (three) times daily.    Yes [provider]  hyoscyamine (LEVSIN SL) 0.125 MG SL tablet Place 2 tablets (0.25 mg total) under the tongue every 4 (four) hours as needed for cramping. 06/25/16  Yes Ana Grist, MD  insulin aspart (NOVOLOG) 100 UNIT/ML FlexPen Inject 10-16 Units into the skin See admin instructions. 10 units with breakfast, 10 units with lunch, 16 units at dinner, >200 increase by 2 units. 03/01/15 07/23/17 Yes [provider]  insulin glargine (LANTUS) 100 UNIT/ML injection Inject 32 Units into the skin daily.  Yes [provider]  ipratropium-albuterol (DUONEB) 0.5-2.5 (3) MG/3ML SOLN Take 3 mLs by nebulization every 6 (six) hours. 10/14/16  Yes Vaughan Basta, MD  levothyroxine (SYNTHROID, LEVOTHROID) 75 MCG tablet Take 75 mcg by mouth daily before breakfast.  11/24/12  Yes [provider]  metoprolol succinate (TOPROL-XL) 25 MG 24 hr tablet Take 1 tablet (25 mg total) by mouth 2 (two) times daily. 11/15/16  Yes Minna Merritts, MD  Multiple Vitamin (MULTIVITAMIN) tablet Take 1 tablet by mouth daily.   Yes [provider]  nitroGLYCERIN (NITROSTAT) 0.4 MG SL tablet Place 1 tablet (0.4 mg total) under the tongue every 5 (five) minutes as needed for chest pain. 03/06/16 09/18/17 Yes Gollan, Kathlene November, MD  ondansetron (ZOFRAN) 4 MG tablet Take 1 tablet (4 mg total) by mouth every 6 (six) hours as needed for nausea. 06/25/16  Yes Ana Grist, MD  oxyCODONE (ROXICODONE) 5 MG immediate release tablet Take 1 tablet (5 mg total) by mouth every 6 (six) hours as needed. 10/11/16 10/11/17 Yes Ana Sheehan, MD  potassium chloride SA (K-DUR,KLOR-CON) 20 MEQ tablet Take 40 mEq daily by mouth.   Yes [provider]  ranolazine (RANEXA) 1000 MG SR tablet Take 1 tablet (1,000 mg total) by mouth 2 (two) times daily. 11/15/16  Yes Gollan, Kathlene November, MD  ropinirole (REQUIP) 5 MG tablet Take 5 mg by mouth See admin  instructions. Takes 5mg  twice a day. Takes 5mg  midday as needed for restless legs.   Yes [provider]  temazepam (RESTORIL) 30 MG capsule Take 30 mg by mouth at bedtime.   Yes [provider]  torsemide (DEMADEX) 20 MG tablet Take 3 tablets (60 mg total) by mouth 2 (two) times daily. Patient taking differently: Take 40 mg daily by mouth.  11/15/16  Yes Minna Merritts, MD  torsemide (DEMADEX) 20 MG tablet Take 40 mg 2 (two) times daily by mouth.   Yes [provider]  lisinopril (PRINIVIL,ZESTRIL) 5 MG tablet Take 1 tablet (5 mg total) by mouth daily. Patient not taking: Reported on 12/17/2016 07/28/16   Ana Bow, MD    Review of Systems  Constitutional: Positive for fatigue. Negative for appetite change.  HENT: Negative for congestion, postnasal drip and sore throat.   Eyes: Negative.   Respiratory: Positive for shortness of breath. Negative for cough and chest tightness.   Cardiovascular: Positive for leg swelling. Negative for chest pain and palpitations.  Gastrointestinal: Negative for abdominal distention and abdominal pain.  Endocrine: Negative.   Genitourinary: Negative.   Musculoskeletal: Negative for back pain and neck pain.  Skin: Negative.   Allergic/Immunologic: Negative.   Neurological: Positive for light-headedness. Negative for dizziness.  Hematological: Negative for adenopathy. Bruises/bleeds easily.  Psychiatric/Behavioral: Negative for dysphoric mood and sleep disturbance (sleeping with CPAP). The patient is not nervous/anxious.    Vitals:   12/17/16 1034  BP: (!) 130/56  Pulse: 85  Resp: 18  SpO2: 100%  Weight: 222 lb 6 oz (100.9 kg)  Height: 5\' 3"  (1.6 m)   Wt Readings from Last 3 Encounters:  12/17/16 222 lb 6 oz (100.9 kg)  11/15/16 225 lb (102.1 kg)  10/11/16 219 lb (99.3 kg)    Lab Results  Component Value Date   CREATININE 1.12 (H) 10/13/2016   CREATININE 1.25 (H) 10/11/2016   CREATININE 1.32 (H) 07/28/2016     Physical Exam  Constitutional: She is oriented to person, place, and time. She appears well-developed and well-nourished.  HENT:  Head: Normocephalic and atraumatic.  Neck: Normal range of motion. Neck supple. No JVD present.  Cardiovascular: Normal rate and regular rhythm.  Pulmonary/Chest: Effort normal. She has no wheezes. She has no rales.  Abdominal: Soft. She exhibits no distension. There is no tenderness.  Musculoskeletal: She exhibits edema (2+ pitting edema around both ankles). She exhibits no tenderness.  Neurological: She is alert and oriented to person, place, and time.  Skin: Skin is warm and dry.  Psychiatric: She has a normal mood and affect. Her behavior is normal. Thought content normal.  Nursing note and vitals reviewed.   Assessment & Plan:  1: Chronic heart failure with preserved ejection fraction- - NYHA class III - fluid overloaded at this time - has been weighing daily and reports a stable weight. Reminded to call for an overnight weight gain of >2 pounds or a weekly weight gain of >5 pounds - BMP drawn on 10/13/16 reviewed and showed potassium 4.5 and GFR 48  - uses lite salt along with greek seasoning but is unsure if there's salt in the greek seasoning or not. Discussed the importance of closely following a 2000mg  sodium diet  - saw cardiologist [name]) 11/15/16 - she stopped her lisinopril on her own because she felt like her BP was getting too low and has only been taking 40mg  torsemide daily because she feels like her BP drops with BID torsemide - encouraged her to at least take 60mg  once daily of torsemide as her edema has worsened - maintain fluid intake to 40-60 ounces daily - BMP drawn today  2: HTN- - BP looks good today - she is now taking metoprolol succinate 25mg  BID as taking it all at one time caused dizziness - saw PCP [name]) 10/18/16  3: Diabetes- - glucose this morning at home was 131 - A1c on 10/01/16 was 6.2% - continues to use  novolog and lantus  4: Lymphedema- - does elevate her legs but edema persists - is going to get some TED hose or compression socks - discussed using compression boots if edema continues after getting compression socks; brochure provided to patient - limited in exercise due to her shortness of breath  Return here in 1 month or sooner for any questions/problems before then.

## 2016-12-17 NOTE — Patient Instructions (Signed)
Continue weighing daily and call for an overnight weight gain of > 2 pounds or a weekly weight gain of >5 pounds.  Try taking an additional 20mg  torsemide daily for swelling.

## 2016-12-17 NOTE — Telephone Encounter (Signed)
Spoke with patient regarding lab results that were obtained today (12/17/16). Potassium is normal at 4.1 and GFR has declined slightly but not much over the last couple of months. Continue medications at this time.

## 2016-12-18 DIAGNOSIS — I959 Hypotension, unspecified: Secondary | ICD-10-CM | POA: Insufficient documentation

## 2016-12-18 DIAGNOSIS — I890 Lymphedema, not elsewhere classified: Secondary | ICD-10-CM | POA: Insufficient documentation

## 2017-01-09 ENCOUNTER — Other Ambulatory Visit: Payer: Self-pay

## 2017-01-09 ENCOUNTER — Ambulatory Visit: Payer: Medicare Other | Attending: Family | Admitting: Family

## 2017-01-09 ENCOUNTER — Encounter: Payer: Self-pay | Admitting: Family

## 2017-01-09 VITALS — BP 148/71 | HR 81 | Resp 18 | Ht 63.0 in | Wt 222.5 lb

## 2017-01-09 DIAGNOSIS — Z888 Allergy status to other drugs, medicaments and biological substances status: Secondary | ICD-10-CM | POA: Insufficient documentation

## 2017-01-09 DIAGNOSIS — G4733 Obstructive sleep apnea (adult) (pediatric): Secondary | ICD-10-CM | POA: Diagnosis not present

## 2017-01-09 DIAGNOSIS — G2581 Restless legs syndrome: Secondary | ICD-10-CM | POA: Diagnosis not present

## 2017-01-09 DIAGNOSIS — Z7982 Long term (current) use of aspirin: Secondary | ICD-10-CM | POA: Insufficient documentation

## 2017-01-09 DIAGNOSIS — I2510 Atherosclerotic heart disease of native coronary artery without angina pectoris: Secondary | ICD-10-CM | POA: Insufficient documentation

## 2017-01-09 DIAGNOSIS — G4700 Insomnia, unspecified: Secondary | ICD-10-CM | POA: Diagnosis not present

## 2017-01-09 DIAGNOSIS — I5032 Chronic diastolic (congestive) heart failure: Secondary | ICD-10-CM | POA: Diagnosis present

## 2017-01-09 DIAGNOSIS — E039 Hypothyroidism, unspecified: Secondary | ICD-10-CM | POA: Diagnosis not present

## 2017-01-09 DIAGNOSIS — I110 Hypertensive heart disease with heart failure: Secondary | ICD-10-CM | POA: Diagnosis not present

## 2017-01-09 DIAGNOSIS — E785 Hyperlipidemia, unspecified: Secondary | ICD-10-CM | POA: Diagnosis not present

## 2017-01-09 DIAGNOSIS — M109 Gout, unspecified: Secondary | ICD-10-CM | POA: Diagnosis not present

## 2017-01-09 DIAGNOSIS — Z9049 Acquired absence of other specified parts of digestive tract: Secondary | ICD-10-CM | POA: Insufficient documentation

## 2017-01-09 DIAGNOSIS — Z981 Arthrodesis status: Secondary | ICD-10-CM | POA: Insufficient documentation

## 2017-01-09 DIAGNOSIS — Z8249 Family history of ischemic heart disease and other diseases of the circulatory system: Secondary | ICD-10-CM | POA: Insufficient documentation

## 2017-01-09 DIAGNOSIS — I890 Lymphedema, not elsewhere classified: Secondary | ICD-10-CM | POA: Insufficient documentation

## 2017-01-09 DIAGNOSIS — Z86010 Personal history of colonic polyps: Secondary | ICD-10-CM | POA: Insufficient documentation

## 2017-01-09 DIAGNOSIS — I714 Abdominal aortic aneurysm, without rupture: Secondary | ICD-10-CM | POA: Insufficient documentation

## 2017-01-09 DIAGNOSIS — J441 Chronic obstructive pulmonary disease with (acute) exacerbation: Secondary | ICD-10-CM | POA: Insufficient documentation

## 2017-01-09 DIAGNOSIS — Z9841 Cataract extraction status, right eye: Secondary | ICD-10-CM | POA: Insufficient documentation

## 2017-01-09 DIAGNOSIS — Z880 Allergy status to penicillin: Secondary | ICD-10-CM | POA: Diagnosis not present

## 2017-01-09 DIAGNOSIS — Z79899 Other long term (current) drug therapy: Secondary | ICD-10-CM | POA: Diagnosis not present

## 2017-01-09 DIAGNOSIS — Z90722 Acquired absence of ovaries, bilateral: Secondary | ICD-10-CM | POA: Diagnosis not present

## 2017-01-09 DIAGNOSIS — K219 Gastro-esophageal reflux disease without esophagitis: Secondary | ICD-10-CM | POA: Diagnosis not present

## 2017-01-09 DIAGNOSIS — Z794 Long term (current) use of insulin: Secondary | ICD-10-CM | POA: Diagnosis not present

## 2017-01-09 DIAGNOSIS — Z803 Family history of malignant neoplasm of breast: Secondary | ICD-10-CM | POA: Insufficient documentation

## 2017-01-09 DIAGNOSIS — Z90710 Acquired absence of both cervix and uterus: Secondary | ICD-10-CM | POA: Insufficient documentation

## 2017-01-09 DIAGNOSIS — Z9889 Other specified postprocedural states: Secondary | ICD-10-CM | POA: Insufficient documentation

## 2017-01-09 DIAGNOSIS — Z9842 Cataract extraction status, left eye: Secondary | ICD-10-CM | POA: Insufficient documentation

## 2017-01-09 DIAGNOSIS — E1151 Type 2 diabetes mellitus with diabetic peripheral angiopathy without gangrene: Secondary | ICD-10-CM | POA: Diagnosis not present

## 2017-01-09 DIAGNOSIS — Z885 Allergy status to narcotic agent status: Secondary | ICD-10-CM | POA: Insufficient documentation

## 2017-01-09 DIAGNOSIS — R072 Precordial pain: Secondary | ICD-10-CM

## 2017-01-09 DIAGNOSIS — I10 Essential (primary) hypertension: Secondary | ICD-10-CM

## 2017-01-09 MED ORDER — ALBUTEROL SULFATE HFA 108 (90 BASE) MCG/ACT IN AERS: 2.0000 | INHALATION_SPRAY | Freq: Four times a day (QID) | RESPIRATORY_TRACT | 3 refills | Status: DC | PRN

## 2017-01-09 NOTE — Progress Notes (Signed)
Patient ID: [name], female    DOB: Sep 04, 1945, 71 y.o.   MRN: [id number]  HPI  [name] is a 71 y/o female with a history of anxiety, CAD, depression, DM, GERD, gout, hyperlipidemia, HTN, hypothyroidism, obstructive sleep apnea (CPAP), PVD, NSVT and chronic heart failure.   Reviewed last echo report from 07/26/16 which showed an EF of 60-65%.  Admitted 10/11/16 due to COPD flare and aspiration pneumonia. Was given IV solu-medrol, nebulizers and incentive spirometer. Initially needed oxygen and then transitioned to room air. Discharged home after 3 days. Admitted for same day surgery on 08/29/16 for ORIF of left radial fracture. Was in the ED 08/23/16 after a mechanical fall in her garage. Diagnosed with radial fracture. Discharged with orthopaedic follow-up the next day.  Admitted 07/23/16 due to COPD/HF exacerbation. Initially needed bipap and then transitioned to room air. Nebulizers PRN along with IV steroids. Initially needed IV diuretics. Cardiology consult obtained. Discharged home after 5 days with home health nursing and physical therapy.   She presents today for a follow-up visit with a chief complaint of chest pain. She says that it's been present for the last few days and has been intermittent in nature. She has also noticed worsening shortness of breath along with this. She does state that she also had diarrhea for previous 4 days. During those 4 days, she was drinking gatorade to stay hydrated.   Past Medical History:  Diagnosis Date  . Anginal pain ([place])   . Anxiety   . Arthritis   . CHF (congestive heart failure) ([place])   . Chronic back pain    stenosis.degenerative disc,some scoliosis  . Constipation    takes Stool Softener daily  . Coronary artery disease   . Depression    takes Cymbalta daily  . Diabetes mellitus    Type 2 diabetic. Average fasting blood sugar runs high 170-200  . Diverticulosis   . E coli infection   . GERD (gastroesophageal reflux disease)    takes Nexium daily  . Headache   . Headache 02/16/2016  . Hemorrhoids   . History of colon polyps    benign  . History of gout    doesn't take any meds  . History of hiatal hernia   . History of vertigo    doesn't take any meds  . Hyperlipidemia    takes Praluent daily  . Hypertension    currently BP medications are on hold   . Hypothyroidism    takes Synthroid daily  . Insomnia    takes Restoril nightly  . Joint pain   . Joint swelling   . Muscle spasm    takes Robaxin as needed  . NSVT (nonsustained ventricular tachycardia) ([place])   . OSA on CPAP   . Periodic heart flutter   . Peripheral edema    takes LAsix as needed  . Peripheral vascular disease ([place])    AAA as stated per pt / was just discovered and pt states has not been referred to vascular MD   . Restless leg    takes Requip daily  . Rosacea   . Sleep apnea   . Urinary incontinence   . Urinary urgency   . Varicose veins   . Weakness    numbness and tingling.   Past Surgical History:  Procedure Laterality Date  . ABDOMINAL HYSTERECTOMY     with BSo  . CARDIAC CATHETERIZATION  2013   Normal  . CARPAL TUNNEL RELEASE Bilateral   .  CATARACT EXTRACTION, BILATERAL    . CHOLECYSTECTOMY    . COLONOSCOPY    . DIAGNOSTIC LAPAROSCOPY     multiple times  . DILATION AND CURETTAGE OF UTERUS    . ESOPHAGOGASTRODUODENOSCOPY (EGD) WITH PROPOFOL N/A 11/01/2014   Procedure: ESOPHAGOGASTRODUODENOSCOPY (EGD) WITH PROPOFOL;  Surgeon: Hulen Luster, MD;  Location: [facility] ENDOSCOPY;  Service: Gastroenterology;  Laterality: N/A;  . HARDWARE REMOVAL Left 10/11/2016   Procedure: HARDWARE REMOVAL-LEFT RADIUS;  Surgeon: Lovell Sheehan, MD;  Location: [facility] ORS;  Service: Orthopedics;  Laterality: Left;  Left Radius Wrist   . IMPLANTABLE CONTACT LENS IMPLANTATION     bilateral  . LAMINECTOMY  11/13/2015  . LUMBAR FUSION  11/2015  . LUMBAR WOUND DEBRIDEMENT N/A 12/02/2015   Procedure: WOUND Exploration;  Surgeon: Consuella Lose, MD;   Location: [place];  Service: Neurosurgery;  Laterality: N/A;  . OPEN REDUCTION INTERNAL FIXATION (ORIF) DISTAL RADIAL FRACTURE Left 08/29/2016   Procedure: OPEN REDUCTION INTERNAL FIXATION (ORIF) DISTAL RADIAL FRACTURE;  Surgeon: Lovell Sheehan, MD;  Location: [facility] ORS;  Service: Orthopedics;  Laterality: Left;  . PICC LINE PLACE PERIPHERAL ([place] HX)     right upper arm   . ROTATOR CUFF REPAIR Left   . SAVORY DILATION N/A 11/01/2014   Procedure: SAVORY DILATION;  Surgeon: Hulen Luster, MD;  Location: [facility] ENDOSCOPY;  Service: Gastroenterology;  Laterality: N/A;  . TONSILLECTOMY    . TRIGGER FINGER RELEASE Bilateral    Family History  Problem Relation Age of Onset  . Heart disease Mother   . Breast cancer Mother 62  . Heart disease Father   . Heart attack Sister   . Heart disease Sister   . Heart disease Brother    Social History   Tobacco Use  . Smoking status: Never Smoker  . Smokeless tobacco: Never Used  Substance Use Topics  . Alcohol use: No   Allergies  Allergen Reactions  . Ceftin [Cefuroxime Axetil] Diarrhea  . Codeine Rash       . Penicillins Rash    Has patient had a PCN reaction causing immediate rash, facial/tongue/throat swelling, SOB or lightheadedness with hypotension: NO Has patient had a PCN reaction causing severe rash involving mucus membranes or skin necrosis: NO Has patient had a PCN retioion that required hospitalization NO Has patient had a PCN reaction occurring within the last 10 years: NO If all of the above answers are "NO", then may proceed with Cephalosporin use.  . Vicodin [Hydrocodone-Acetaminophen] Other (See Comments)    passes out   Prior to Admission medications   Medication Sig Start Date End Date Taking? Authorizing Provider  albuterol (PROVENTIL HFA;VENTOLIN HFA) 108 (90 Base) MCG/ACT inhaler Inhale 2 puffs into the lungs every 6 (six) hours as needed for wheezing or shortness of breath. 07/28/16  Yes Sudini, Alveta Heimlich, MD  Alirocumab  (PRALUENT) 150 MG/ML SOPN Inject 1 pen into the skin every 14 (fourteen) days. 10/19/16  Yes Minna Merritts, MD  aspirin 81 MG tablet Take 81 mg by mouth at bedtime.    Yes [provider]  Azelastine HCl (ASTEPRO) 0.15 % SOLN Place 2 sprays into both nostrils at bedtime.    Yes [provider]  Cholecalciferol (VITAMIN D) 2000 units tablet Take 2,000 Units by mouth daily.   Yes [provider]  desonide (DESOWEN) 0.05 % lotion Apply 1 application topically daily.   Yes [provider]  esomeprazole (NEXIUM) 40 MG capsule Take 40 mg by mouth 3 (three) times  daily before meals.    Yes [provider]  gabapentin (NEURONTIN) 300 MG capsule Take 300 mg by mouth 3 (three) times daily.    Yes [provider]  hyoscyamine (LEVSIN SL) 0.125 MG SL tablet Place 2 tablets (0.25 mg total) under the tongue every 4 (four) hours as needed for cramping. 06/25/16  Yes Theodoro Grist, MD  insulin aspart (NOVOLOG) 100 UNIT/ML FlexPen Inject 10-16 Units into the skin See admin instructions. 10 units with breakfast, 10 units with lunch, 16 units at dinner, >200 increase by 2 units. 03/01/15 07/23/17 Yes [provider]  insulin glargine (LANTUS) 100 UNIT/ML injection Inject 32 Units into the skin daily.    Yes [provider]  ipratropium-albuterol (DUONEB) 0.5-2.5 (3) MG/3ML SOLN Take 3 mLs by nebulization every 6 (six) hours. 10/14/16  Yes Vaughan Basta, MD  levothyroxine (SYNTHROID, LEVOTHROID) 75 MCG tablet Take 75 mcg by mouth daily before breakfast.  11/24/12  Yes [provider]  metoprolol succinate (TOPROL-XL) 25 MG 24 hr tablet Take 1 tablet (25 mg total) by mouth 2 (two) times daily. 11/15/16  Yes Minna Merritts, MD  Multiple Vitamin (MULTIVITAMIN) tablet Take 1 tablet by mouth daily.   Yes [provider]  nitroGLYCERIN (NITROSTAT) 0.4 MG SL tablet Place 1 tablet (0.4 mg total) under the tongue every 5 (five)  minutes as needed for chest pain. 03/06/16 09/18/17 Yes Gollan, Kathlene November, MD  ondansetron (ZOFRAN) 4 MG tablet Take 1 tablet (4 mg total) by mouth every 6 (six) hours as needed for nausea. 06/25/16  Yes Theodoro Grist, MD  oxyCODONE (ROXICODONE) 5 MG immediate release tablet Take 1 tablet (5 mg total) by mouth every 6 (six) hours as needed. 10/11/16 10/11/17 Yes Lovell Sheehan, MD  potassium chloride SA (K-DUR,KLOR-CON) 20 MEQ tablet Take 40 mEq daily by mouth.   Yes [provider]  ranolazine (RANEXA) 1000 MG SR tablet Take 1 tablet (1,000 mg total) by mouth 2 (two) times daily. 11/15/16  Yes Gollan, Kathlene November, MD  ropinirole (REQUIP) 5 MG tablet Take 5 mg by mouth See admin instructions. Takes 5mg  twice a day. Takes 5mg  midday as needed for restless legs.   Yes [provider]  temazepam (RESTORIL) 30 MG capsule Take 30 mg by mouth at bedtime.   Yes [provider]  torsemide (DEMADEX) 20 MG tablet Take 3 tablets (60 mg total) by mouth 2 (two) times daily. Patient taking differently: Take 40 mg daily by mouth.  11/15/16  Yes Minna Merritts, MD  torsemide (DEMADEX) 20 MG tablet Take 40 mg 2 (two) times daily by mouth.   Yes [provider]  lisinopril (PRINIVIL,ZESTRIL) 5 MG tablet Take 1 tablet (5 mg total) by mouth daily. Patient not taking: Reported on 12/17/2016 07/28/16   Hillary Bow, MD    Review of Systems  Constitutional: Positive for fatigue. Negative for appetite change.  HENT: Negative for congestion, postnasal drip and sore throat.   Eyes: Negative.   Respiratory: Positive for shortness of breath. Negative for cough, chest tightness and wheezing.   Cardiovascular: Positive for chest pain (yesterday) and leg swelling. Negative for palpitations.  Gastrointestinal: Positive for diarrhea (for 4 days; resolved now). Negative for abdominal distention and abdominal pain.  Endocrine: Negative.   Genitourinary: Negative.   Musculoskeletal: Negative for  back pain and neck pain.  Skin: Negative.   Allergic/Immunologic: Negative.   Neurological: Positive for light-headedness. Negative for dizziness.  Hematological: Negative for adenopathy. Bruises/bleeds easily.  Psychiatric/Behavioral: Negative for dysphoric mood and sleep disturbance (sleeping with CPAP). The patient is not nervous/anxious.    Vitals:   01/09/17 1104  BP: (!) 148/71  Pulse: 81  Resp: 18  SpO2: 100%  Weight: 222 lb 8 oz (100.9 kg)  Height: 5\' 3"  (1.6 m)   Wt Readings from Last 3 Encounters:  01/09/17 222 lb 8 oz (100.9 kg)  12/17/16 222 lb 6 oz (100.9 kg)  11/15/16 225 lb (102.1 kg)    Lab Results  Component Value Date   CREATININE 1.25 (H) 12/17/2016   CREATININE 1.12 (H) 10/13/2016   CREATININE 1.25 (H) 10/11/2016    Physical Exam  Constitutional: She is oriented to person, place, and time. She appears well-developed and well-nourished.  HENT:  Head: Normocephalic and atraumatic.  Neck: Normal range of motion. Neck supple. No JVD present.  Cardiovascular: Normal rate and regular rhythm.  Pulmonary/Chest: Effort normal. She has no wheezes. She has no rales.  Abdominal: Soft. She exhibits no distension. There is no tenderness.  Musculoskeletal: She exhibits edema (2+ pitting edema around both ankles). She exhibits no tenderness.  Neurological: She is alert and oriented to person, place, and time.  Skin: Skin is warm and dry.  Psychiatric: She has a normal mood and affect. Her behavior is normal. Thought content normal.  Nursing note and vitals reviewed.  Assessment & Plan:  1: Chronic heart failure with preserved ejection fraction- - NYHA class III - fluid overloaded at this time but not any worse - has been weighing daily and reports a stable weight. Reminded to call for an overnight weight gain of >2 pounds or a weekly weight gain of >5 pounds - BMP drawn on 12/17/16 reviewed and showed potassium 4.1 and GFR 42  - uses lite salt along with greek  seasoning but is unsure if there's salt in the greek seasoning or not. Discussed the importance of closely following a 2000mg  sodium diet  - saw cardiologist Rockey Situ) 11/15/16 - she stopped her lisinopril on her own because she felt like her BP was getting too low and has only been taking 40mg  torsemide daily because she feels like her BP drops with BID torsemide - encouraged her to try and take an additional 20mg  torsemide in the PM if she doesn't feel like she can tolerate 40mg  BID - maintain fluid intake to 40-60 ounces daily - was drinking gatorade during these days of diarrhea so probably had more sodium intake than her normal  2: HTN- - BP looks good today - she is taking metoprolol succinate 25mg  BID as taking it all at one time caused dizziness - saw PCP Sabra Heck) 10/18/16  3: Precordial pain- - EKG done in the office shows a NSR - encouraged her to follow-up with her cardiologist if this pain continues  4: Lymphedema- - does elevate her legs but edema persists - unable to wear compression socks as she's unable to bend over very far due to hardware in her back - will make a referral for the lymphapress compression boots - limited in exercise due to her shortness of breath & back surgery  Return here in 2 months or sooner for any questions/problems before then.

## 2017-01-09 NOTE — Patient Instructions (Signed)
Continue weighing daily and call for an overnight weight gain of > 2 pounds or a weekly weight gain of >5 pounds. 

## 2017-01-14 ENCOUNTER — Ambulatory Visit: Payer: Medicare Other | Admitting: Family

## 2017-01-15 DIAGNOSIS — M5430 Sciatica, unspecified side: Secondary | ICD-10-CM | POA: Insufficient documentation

## 2017-02-07 ENCOUNTER — Ambulatory Visit: Payer: Medicare Other | Admitting: Family

## 2017-02-13 ENCOUNTER — Ambulatory Visit: Payer: Medicare Other | Admitting: Family

## 2017-02-13 NOTE — Progress Notes (Deleted)
Patient ID: [name], female    DOB: 1945-07-20, 72 y.o.   MRN: [id number]  HPI  [name] is a 72 y/o female with a history of anxiety, CAD, depression, DM, GERD, gout, hyperlipidemia, HTN, hypothyroidism, obstructive sleep apnea (CPAP), PVD, NSVT and chronic heart failure.   Reviewed last echo report from 07/26/16 which showed an EF of 60-65%.  Admitted 10/11/16 due to COPD flare and aspiration pneumonia. Was given IV solu-medrol, nebulizers and incentive spirometer. Initially needed oxygen and then transitioned to room air. Discharged home after 3 days. Admitted for same day surgery on 08/29/16 for ORIF of left radial fracture. Was in the ED 08/23/16 after a mechanical fall in her garage. Diagnosed with radial fracture. Discharged with orthopaedic follow-up the next day.  Admitted 07/23/16 due to COPD/HF exacerbation. Initially needed bipap and then transitioned to room air. Nebulizers PRN along with IV steroids. Initially needed IV diuretics. Cardiology consult obtained. Discharged home after 5 days with home health nursing and physical therapy.   She presents today for a follow-up visit with a chief complaint of chest pain. She says that it's been present for the last few days and has been intermittent in nature. She has also noticed worsening shortness of breath along with this. She does state that she also had diarrhea for previous 4 days. During those 4 days, she was drinking gatorade to stay hydrated.   Past Medical History:  Diagnosis Date  . Anginal pain ([place])   . Anxiety   . Arthritis   . CHF (congestive heart failure) ([place])   . Chronic back pain    stenosis.degenerative disc,some scoliosis  . Constipation    takes Stool Softener daily  . Coronary artery disease   . Depression    takes Cymbalta daily  . Diabetes mellitus    Type 2 diabetic. Average fasting blood sugar runs high 170-200  . Diverticulosis   . E coli infection   . GERD (gastroesophageal reflux disease)    takes Nexium daily  . Headache   . Headache 02/16/2016  . Hemorrhoids   . History of colon polyps    benign  . History of gout    doesn't take any meds  . History of hiatal hernia   . History of vertigo    doesn't take any meds  . Hyperlipidemia    takes Praluent daily  . Hypertension    currently BP medications are on hold   . Hypothyroidism    takes Synthroid daily  . Insomnia    takes Restoril nightly  . Joint pain   . Joint swelling   . Muscle spasm    takes Robaxin as needed  . NSVT (nonsustained ventricular tachycardia) ([employer])   . OSA on CPAP   . Periodic heart flutter   . Peripheral edema    takes LAsix as needed  . Peripheral vascular disease ([place])    AAA as stated per pt / was just discovered and pt states has not been referred to vascular MD   . Restless leg    takes Requip daily  . Rosacea   . Sleep apnea   . Urinary incontinence   . Urinary urgency   . Varicose veins   . Weakness    numbness and tingling.   Past Surgical History:  Procedure Laterality Date  . ABDOMINAL HYSTERECTOMY     with BSo  . CARDIAC CATHETERIZATION  2013   Normal  . CARPAL TUNNEL RELEASE Bilateral   .  CATARACT EXTRACTION, BILATERAL    . CHOLECYSTECTOMY    . COLONOSCOPY    . DIAGNOSTIC LAPAROSCOPY     multiple times  . DILATION AND CURETTAGE OF UTERUS    . ESOPHAGOGASTRODUODENOSCOPY (EGD) WITH PROPOFOL N/A 11/01/2014   Procedure: ESOPHAGOGASTRODUODENOSCOPY (EGD) WITH PROPOFOL;  Surgeon: Hulen Luster, MD;  Location: [facility] ENDOSCOPY;  Service: Gastroenterology;  Laterality: N/A;  . HARDWARE REMOVAL Left 10/11/2016   Procedure: HARDWARE REMOVAL-LEFT RADIUS;  Surgeon: Lovell Sheehan, MD;  Location: [facility] ORS;  Service: Orthopedics;  Laterality: Left;  Left Radius Wrist   . IMPLANTABLE CONTACT LENS IMPLANTATION     bilateral  . LAMINECTOMY  11/13/2015  . LUMBAR FUSION  11/2015  . LUMBAR WOUND DEBRIDEMENT N/A 12/02/2015   Procedure: WOUND Exploration;  Surgeon: Consuella Lose, MD;   Location: [place];  Service: Neurosurgery;  Laterality: N/A;  . OPEN REDUCTION INTERNAL FIXATION (ORIF) DISTAL RADIAL FRACTURE Left 08/29/2016   Procedure: OPEN REDUCTION INTERNAL FIXATION (ORIF) DISTAL RADIAL FRACTURE;  Surgeon: Lovell Sheehan, MD;  Location: [facility] ORS;  Service: Orthopedics;  Laterality: Left;  . PICC LINE PLACE PERIPHERAL ([place] HX)     right upper arm   . ROTATOR CUFF REPAIR Left   . SAVORY DILATION N/A 11/01/2014   Procedure: SAVORY DILATION;  Surgeon: Hulen Luster, MD;  Location: [facility] ENDOSCOPY;  Service: Gastroenterology;  Laterality: N/A;  . TONSILLECTOMY    . TRIGGER FINGER RELEASE Bilateral    Family History  Problem Relation Age of Onset  . Heart disease Mother   . Breast cancer Mother 25  . Heart disease Father   . Heart attack Sister   . Heart disease Sister   . Heart disease Brother    Social History   Tobacco Use  . Smoking status: Never Smoker  . Smokeless tobacco: Never Used  Substance Use Topics  . Alcohol use: No   Allergies  Allergen Reactions  . Ceftin [Cefuroxime Axetil] Diarrhea  . Codeine Rash       . Penicillins Rash    Has patient had a PCN reaction causing immediate rash, facial/tongue/throat swelling, SOB or lightheadedness with hypotension: NO Has patient had a PCN reaction causing severe rash involving mucus membranes or skin necrosis: NO Has patient had a PCN retioion that required hospitalization NO Has patient had a PCN reaction occurring within the last 10 years: NO If all of the above answers are "NO", then may proceed with Cephalosporin use.  . Vicodin [Hydrocodone-Acetaminophen] Other (See Comments)    passes out     Review of Systems  Constitutional: Positive for fatigue. Negative for appetite change.  HENT: Negative for congestion, postnasal drip and sore throat.   Eyes: Negative.   Respiratory: Positive for shortness of breath. Negative for cough, chest tightness and wheezing.   Cardiovascular: Positive for chest pain  (yesterday) and leg swelling. Negative for palpitations.  Gastrointestinal: Positive for diarrhea (for 4 days; resolved now). Negative for abdominal distention and abdominal pain.  Endocrine: Negative.   Genitourinary: Negative.   Musculoskeletal: Negative for back pain and neck pain.  Skin: Negative.   Allergic/Immunologic: Negative.   Neurological: Positive for light-headedness. Negative for dizziness.  Hematological: Negative for adenopathy. Bruises/bleeds easily.  Psychiatric/Behavioral: Negative for dysphoric mood and sleep disturbance (sleeping with CPAP). The patient is not nervous/anxious.      Lab Results  Component Value Date   CREATININE 1.25 (H) 12/17/2016   CREATININE 1.12 (H) 10/13/2016   CREATININE 1.25 (H) 10/11/2016  Physical Exam  Constitutional: She is oriented to person, place, and time. She appears well-developed and well-nourished.  HENT:  Head: Normocephalic and atraumatic.  Neck: Normal range of motion. Neck supple. No JVD present.  Cardiovascular: Normal rate and regular rhythm.  Pulmonary/Chest: Effort normal. She has no wheezes. She has no rales.  Abdominal: Soft. She exhibits no distension. There is no tenderness.  Musculoskeletal: She exhibits edema (2+ pitting edema around both ankles). She exhibits no tenderness.  Neurological: She is alert and oriented to person, place, and time.  Skin: Skin is warm and dry.  Psychiatric: She has a normal mood and affect. Her behavior is normal. Thought content normal.  Nursing note and vitals reviewed.  Assessment & Plan:  1: Chronic heart failure with preserved ejection fraction- - NYHA class III - fluid overloaded at this time but not any worse - has been weighing daily and reports a stable weight. Reminded to call for an overnight weight gain of >2 pounds or a weekly weight gain of >5 pounds - BMP drawn on 12/17/16 reviewed and showed potassium 4.1 and GFR 42  - uses lite salt along with greek seasoning  but is unsure if there's salt in the greek seasoning or not. Discussed the importance of closely following a 2000mg  sodium diet  - saw cardiologist Rockey Situ) 11/15/16 - she stopped her lisinopril on her own because she felt like her BP was getting too low and has only been taking 40mg  torsemide daily because she feels like her BP drops with BID torsemide - encouraged her to try and take an additional 20mg  torsemide in the PM if she doesn't feel like she can tolerate 40mg  BID - maintain fluid intake to 40-60 ounces daily - was drinking gatorade during these days of diarrhea so probably had more sodium intake than her normal  2: HTN- - BP looks good today - she is taking metoprolol succinate 25mg  BID as taking it all at one time caused dizziness - saw PCP Sabra Heck) 10/18/16  3: Precordial pain- - EKG done in the office shows a NSR - encouraged her to follow-up with her cardiologist if this pain continues  4: Lymphedema- - does elevate her legs but edema persists - unable to wear compression socks as she's unable to bend over very far due to hardware in her back - will make a referral for the lymphapress compression boots - limited in exercise due to her shortness of breath & back surgery  Return here in 2 months or sooner for any questions/problems before then.

## 2017-02-25 ENCOUNTER — Other Ambulatory Visit: Payer: Self-pay | Admitting: Internal Medicine

## 2017-02-25 DIAGNOSIS — Z1231 Encounter for screening mammogram for malignant neoplasm of breast: Secondary | ICD-10-CM

## 2017-03-20 ENCOUNTER — Ambulatory Visit
Admission: RE | Admit: 2017-03-20 | Discharge: 2017-03-20 | Disposition: A | Discharge status: Home / Self Care | Payer: Medicare Other | Source: Ambulatory Visit | Attending: Internal Medicine | Admitting: Internal Medicine

## 2017-03-20 DIAGNOSIS — Z1231 Encounter for screening mammogram for malignant neoplasm of breast: Secondary | ICD-10-CM | POA: Insufficient documentation

## 2017-03-27 ENCOUNTER — Telehealth: Payer: Self-pay | Admitting: Cardiovascular Disease

## 2017-03-27 NOTE — Telephone Encounter (Signed)
Lmov for patient to call us and see why she is coming in sooner than needed  Will try again at a later time

## 2017-03-27 NOTE — Telephone Encounter (Signed)
-----   Message from Britt Bottom, [place] sent at 03/26/2017  2:52 PM EST ----- Pt not due for 6 month f/u until 5/19 not sure why pt is falling up sooner.   Thank you, Lenda Kelp

## 2017-04-02 ENCOUNTER — Ambulatory Visit: Payer: Medicare Other | Admitting: Cardiovascular Disease

## 2017-04-15 DIAGNOSIS — Z0000 Encounter for general adult medical examination without abnormal findings: Secondary | ICD-10-CM | POA: Insufficient documentation

## 2017-05-08 ENCOUNTER — Other Ambulatory Visit: Payer: Self-pay

## 2017-05-08 ENCOUNTER — Encounter: Payer: Self-pay | Admitting: Family

## 2017-05-08 ENCOUNTER — Ambulatory Visit: Payer: Medicare Other | Attending: Family | Admitting: Family

## 2017-05-08 VITALS — BP 148/58 | HR 72 | Resp 18 | Ht 61.0 in | Wt 226.5 lb

## 2017-05-08 DIAGNOSIS — I714 Abdominal aortic aneurysm, without rupture: Secondary | ICD-10-CM | POA: Diagnosis not present

## 2017-05-08 DIAGNOSIS — Z9842 Cataract extraction status, left eye: Secondary | ICD-10-CM | POA: Insufficient documentation

## 2017-05-08 DIAGNOSIS — K219 Gastro-esophageal reflux disease without esophagitis: Secondary | ICD-10-CM | POA: Insufficient documentation

## 2017-05-08 DIAGNOSIS — N183 Chronic kidney disease, stage 3 (moderate): Secondary | ICD-10-CM

## 2017-05-08 DIAGNOSIS — Z885 Allergy status to narcotic agent status: Secondary | ICD-10-CM | POA: Insufficient documentation

## 2017-05-08 DIAGNOSIS — G4733 Obstructive sleep apnea (adult) (pediatric): Secondary | ICD-10-CM | POA: Diagnosis not present

## 2017-05-08 DIAGNOSIS — L719 Rosacea, unspecified: Secondary | ICD-10-CM | POA: Diagnosis not present

## 2017-05-08 DIAGNOSIS — I2510 Atherosclerotic heart disease of native coronary artery without angina pectoris: Secondary | ICD-10-CM | POA: Diagnosis not present

## 2017-05-08 DIAGNOSIS — Z9989 Dependence on other enabling machines and devices: Secondary | ICD-10-CM

## 2017-05-08 DIAGNOSIS — Z90710 Acquired absence of both cervix and uterus: Secondary | ICD-10-CM | POA: Insufficient documentation

## 2017-05-08 DIAGNOSIS — I110 Hypertensive heart disease with heart failure: Secondary | ICD-10-CM | POA: Insufficient documentation

## 2017-05-08 DIAGNOSIS — E039 Hypothyroidism, unspecified: Secondary | ICD-10-CM | POA: Insufficient documentation

## 2017-05-08 DIAGNOSIS — M419 Scoliosis, unspecified: Secondary | ICD-10-CM | POA: Diagnosis not present

## 2017-05-08 DIAGNOSIS — Z9889 Other specified postprocedural states: Secondary | ICD-10-CM | POA: Insufficient documentation

## 2017-05-08 DIAGNOSIS — Z881 Allergy status to other antibiotic agents status: Secondary | ICD-10-CM | POA: Insufficient documentation

## 2017-05-08 DIAGNOSIS — Z803 Family history of malignant neoplasm of breast: Secondary | ICD-10-CM | POA: Insufficient documentation

## 2017-05-08 DIAGNOSIS — F419 Anxiety disorder, unspecified: Secondary | ICD-10-CM | POA: Diagnosis not present

## 2017-05-08 DIAGNOSIS — G4700 Insomnia, unspecified: Secondary | ICD-10-CM | POA: Diagnosis not present

## 2017-05-08 DIAGNOSIS — G8929 Other chronic pain: Secondary | ICD-10-CM | POA: Diagnosis not present

## 2017-05-08 DIAGNOSIS — K449 Diaphragmatic hernia without obstruction or gangrene: Secondary | ICD-10-CM | POA: Insufficient documentation

## 2017-05-08 DIAGNOSIS — G2581 Restless legs syndrome: Secondary | ICD-10-CM | POA: Diagnosis not present

## 2017-05-08 DIAGNOSIS — E1151 Type 2 diabetes mellitus with diabetic peripheral angiopathy without gangrene: Secondary | ICD-10-CM | POA: Insufficient documentation

## 2017-05-08 DIAGNOSIS — E785 Hyperlipidemia, unspecified: Secondary | ICD-10-CM | POA: Insufficient documentation

## 2017-05-08 DIAGNOSIS — M549 Dorsalgia, unspecified: Secondary | ICD-10-CM | POA: Diagnosis not present

## 2017-05-08 DIAGNOSIS — M109 Gout, unspecified: Secondary | ICD-10-CM | POA: Insufficient documentation

## 2017-05-08 DIAGNOSIS — R42 Dizziness and giddiness: Secondary | ICD-10-CM | POA: Diagnosis not present

## 2017-05-08 DIAGNOSIS — I5032 Chronic diastolic (congestive) heart failure: Secondary | ICD-10-CM | POA: Insufficient documentation

## 2017-05-08 DIAGNOSIS — F329 Major depressive disorder, single episode, unspecified: Secondary | ICD-10-CM | POA: Insufficient documentation

## 2017-05-08 DIAGNOSIS — Z79899 Other long term (current) drug therapy: Secondary | ICD-10-CM | POA: Insufficient documentation

## 2017-05-08 DIAGNOSIS — R0602 Shortness of breath: Secondary | ICD-10-CM | POA: Diagnosis not present

## 2017-05-08 DIAGNOSIS — Z794 Long term (current) use of insulin: Secondary | ICD-10-CM | POA: Insufficient documentation

## 2017-05-08 DIAGNOSIS — Z9049 Acquired absence of other specified parts of digestive tract: Secondary | ICD-10-CM | POA: Insufficient documentation

## 2017-05-08 DIAGNOSIS — Z9841 Cataract extraction status, right eye: Secondary | ICD-10-CM | POA: Insufficient documentation

## 2017-05-08 DIAGNOSIS — Z880 Allergy status to penicillin: Secondary | ICD-10-CM | POA: Insufficient documentation

## 2017-05-08 DIAGNOSIS — R002 Palpitations: Secondary | ICD-10-CM | POA: Insufficient documentation

## 2017-05-08 DIAGNOSIS — Z79891 Long term (current) use of opiate analgesic: Secondary | ICD-10-CM | POA: Insufficient documentation

## 2017-05-08 DIAGNOSIS — I890 Lymphedema, not elsewhere classified: Secondary | ICD-10-CM | POA: Diagnosis not present

## 2017-05-08 DIAGNOSIS — Z86010 Personal history of colonic polyps: Secondary | ICD-10-CM | POA: Diagnosis not present

## 2017-05-08 DIAGNOSIS — Z8249 Family history of ischemic heart disease and other diseases of the circulatory system: Secondary | ICD-10-CM | POA: Insufficient documentation

## 2017-05-08 DIAGNOSIS — I10 Essential (primary) hypertension: Secondary | ICD-10-CM

## 2017-05-08 DIAGNOSIS — Z7982 Long term (current) use of aspirin: Secondary | ICD-10-CM | POA: Insufficient documentation

## 2017-05-08 DIAGNOSIS — E1122 Type 2 diabetes mellitus with diabetic chronic kidney disease: Secondary | ICD-10-CM

## 2017-05-08 NOTE — Patient Instructions (Signed)
Continue weighing daily and call for an overnight weight gain of > 2 pounds or a weekly weight gain of >5 pounds. 

## 2017-05-08 NOTE — Progress Notes (Signed)
Patient ID: DORIEN BESSENT, female    DOB: 1945-08-16, 72 y.o.   MRN: [id number]  HPI  Ms Greenly is a 72 y/o female with a history of anxiety, CAD, depression, DM, GERD, gout, hyperlipidemia, HTN, hypothyroidism, obstructive sleep apnea (CPAP), PVD, NSVT and chronic heart failure.   Reviewed last echo report from 07/26/16 which showed an EF of 60-65%.  Has not been admitted or been in the ED in the last 6 months.   She presents today for a follow-up visit with a chief complaint of moderate fatigue upon minimal exertion. She says this has been present for several years and appears to be stable. She has associated shortness of breath, edema, palpitations, light-headedness, chronic back pain and gradual weight gain along with this. She denies any difficulty sleeping, chest pain or cough. Did not take her diuretic at all yesterday because she was away from home and she feels like she has more edema present today.   Past Medical History:  Diagnosis Date  . Anginal pain ([place])   . Anxiety   . Arthritis   . CHF (congestive heart failure) ([place])   . Chronic back pain    stenosis.degenerative disc,some scoliosis  . Constipation    takes Stool Softener daily  . Coronary artery disease   . Depression    takes Cymbalta daily  . Diabetes mellitus    Type 2 diabetic. Average fasting blood sugar runs high 170-200  . Diverticulosis   . E coli infection   . GERD (gastroesophageal reflux disease)    takes Nexium daily  . Headache   . Headache 02/16/2016  . Hemorrhoids   . History of colon polyps    benign  . History of gout    doesn't take any meds  . History of hiatal hernia   . History of vertigo    doesn't take any meds  . Hyperlipidemia    takes Praluent daily  . Hypertension    currently BP medications are on hold   . Hypothyroidism    takes Synthroid daily  . Insomnia    takes Restoril nightly  . Joint pain   . Joint swelling   . Muscle spasm    takes Robaxin as needed  . NSVT  (nonsustained ventricular tachycardia) ([place])   . OSA on CPAP   . Periodic heart flutter   . Peripheral edema    takes LAsix as needed  . Peripheral vascular disease ([place])    AAA as stated per pt / was just discovered and pt states has not been referred to vascular MD   . Restless leg    takes Requip daily  . Rosacea   . Sleep apnea   . Urinary incontinence   . Urinary urgency   . Varicose veins   . Weakness    numbness and tingling.   Past Surgical History:  Procedure Laterality Date  . ABDOMINAL HYSTERECTOMY     with BSo  . CARDIAC CATHETERIZATION  2013   Normal  . CARPAL TUNNEL RELEASE Bilateral   . CATARACT EXTRACTION, BILATERAL    . CHOLECYSTECTOMY    . COLONOSCOPY    . DIAGNOSTIC LAPAROSCOPY     multiple times  . DILATION AND CURETTAGE OF UTERUS    . ESOPHAGOGASTRODUODENOSCOPY (EGD) WITH PROPOFOL N/A 11/01/2014   Procedure: ESOPHAGOGASTRODUODENOSCOPY (EGD) WITH PROPOFOL;  Surgeon: Hulen Luster, MD;  Location: [facility] ENDOSCOPY;  Service: Gastroenterology;  Laterality: N/A;  . HARDWARE REMOVAL Left 10/11/2016   Procedure: HARDWARE  REMOVAL-LEFT RADIUS;  Surgeon: Lovell Sheehan, MD;  Location: [facility] ORS;  Service: Orthopedics;  Laterality: Left;  Left Radius Wrist   . IMPLANTABLE CONTACT LENS IMPLANTATION     bilateral  . LAMINECTOMY  11/13/2015  . LUMBAR FUSION  11/2015  . LUMBAR WOUND DEBRIDEMENT N/A 12/02/2015   Procedure: WOUND Exploration;  Surgeon: Consuella Lose, MD;  Location: [place];  Service: Neurosurgery;  Laterality: N/A;  . OPEN REDUCTION INTERNAL FIXATION (ORIF) DISTAL RADIAL FRACTURE Left 08/29/2016   Procedure: OPEN REDUCTION INTERNAL FIXATION (ORIF) DISTAL RADIAL FRACTURE;  Surgeon: Lovell Sheehan, MD;  Location: [facility] ORS;  Service: Orthopedics;  Laterality: Left;  . PICC LINE PLACE PERIPHERAL ([place] [facility])     right upper arm   . ROTATOR CUFF REPAIR Left   . SAVORY DILATION N/A 11/01/2014   Procedure: SAVORY DILATION;  Surgeon: Hulen Luster, MD;  Location: [facility]  ENDOSCOPY;  Service: Gastroenterology;  Laterality: N/A;  . TONSILLECTOMY    . TRIGGER FINGER RELEASE Bilateral    Family History  Problem Relation Age of Onset  . Heart disease Mother   . Breast cancer Mother 37  . Heart disease Father   . Heart attack Sister   . Heart disease Sister   . Heart disease Brother    Social History   Tobacco Use  . Smoking status: Never Smoker  . Smokeless tobacco: Never Used  Substance Use Topics  . Alcohol use: No   Allergies  Allergen Reactions  . Ceftin [Cefuroxime Axetil] Diarrhea  . Codeine Rash       . Penicillins Rash    Has patient had a PCN reaction causing immediate rash, facial/tongue/throat swelling, SOB or lightheadedness with hypotension: NO Has patient had a PCN reaction causing severe rash involving mucus membranes or skin necrosis: NO Has patient had a PCN retioion that required hospitalization NO Has patient had a PCN reaction occurring within the last 10 years: NO If all of the above answers are "NO", then may proceed with Cephalosporin use.  . Vicodin [Hydrocodone-Acetaminophen] Other (See Comments)    passes out   Prior to Admission medications   Medication Sig Start Date End Date Taking? Authorizing Provider  albuterol (PROVENTIL HFA;VENTOLIN HFA) 108 (90 Base) MCG/ACT inhaler Inhale 2 puffs into the lungs every 6 (six) hours as needed for wheezing or shortness of breath. 01/09/17  Yes Hackney, Tina A, FNP  Alirocumab (PRALUENT) 150 MG/ML SOPN Inject 1 pen into the skin every 14 (fourteen) days. 10/19/16  Yes Minna Merritts, MD  aspirin EC 81 MG tablet Take 81 mg by mouth daily.   Yes [provider]  Azelastine HCl (ASTEPRO) 0.15 % SOLN Place 2 sprays into both nostrils at bedtime.    Yes [provider]  Cholecalciferol (VITAMIN D) 2000 units tablet Take 2,000 Units by mouth daily.   Yes [provider]  desonide (DESOWEN) 0.05 % lotion Apply 1 application topically daily.   Yes [provider]  esomeprazole (NEXIUM) 40 MG capsule Take 40 mg by mouth 3 (three) times daily before meals.    Yes [provider]  gabapentin (NEURONTIN) 300 MG capsule Take 300 mg by mouth 3 (three) times daily.    Yes [provider]  hyoscyamine (LEVSIN SL) 0.125 MG SL tablet Place 2 tablets (0.25 mg total) under the tongue every 4 (four) hours as needed for cramping. 06/25/16  Yes Theodoro Grist, MD  insulin aspart (NOVOLOG) 100 UNIT/ML FlexPen Inject 10-16 Units into the  skin See admin instructions. 10 units with breakfast, 10 units with lunch, 16 units at dinner, >200 increase by 2 units. 03/01/15 07/23/17 Yes [provider]  insulin glargine (LANTUS) 100 UNIT/ML injection Inject 32 Units into the skin daily.    Yes [provider]  ipratropium-albuterol (DUONEB) 0.5-2.5 (3) MG/3ML SOLN Take 3 mLs by nebulization every 6 (six) hours. Patient taking differently: Take 3 mLs by nebulization every 6 (six) hours as needed.  10/14/16  Yes Vaughan Basta, MD  levothyroxine (SYNTHROID, LEVOTHROID) 75 MCG tablet Take 75 mcg by mouth daily before breakfast.  11/24/12  Yes [provider]  metoprolol succinate (TOPROL-XL) 25 MG 24 hr tablet Take 1 tablet (25 mg total) by mouth 2 (two) times daily. 11/15/16  Yes Minna Merritts, MD  Multiple Vitamin (MULTIVITAMIN) tablet Take 1 tablet by mouth daily.   Yes [provider]  nitroGLYCERIN (NITROSTAT) 0.4 MG SL tablet Place 1 tablet (0.4 mg total) under the tongue every 5 (five) minutes as needed for chest pain. 03/06/16 09/18/17 Yes Gollan, Kathlene November, MD  potassium chloride SA (K-DUR,KLOR-CON) 20 MEQ tablet Take 40 mEq daily by mouth.   Yes [provider]  ranolazine (RANEXA) 1000 MG SR tablet Take 1 tablet (1,000 mg total) by mouth 2 (two) times daily. 11/15/16  Yes Gollan, Kathlene November, MD  ropinirole (REQUIP) 5 MG tablet Take 5 mg by mouth 2 (two) times daily. Takes in afternoon and evening    Yes [provider]  temazepam (RESTORIL) 30 MG capsule Take 30 mg by mouth at bedtime.   Yes [provider]  torsemide (DEMADEX) 20 MG tablet Take 40 mg by mouth daily.    Yes [provider]  aspirin 81 MG tablet Take 81 mg by mouth at bedtime.     [provider]  oxyCODONE (ROXICODONE) 5 MG immediate release tablet Take 1 tablet (5 mg total) by mouth every 6 (six) hours as needed. Patient not taking: Reported on 05/08/2017 10/11/16 10/11/17  Lovell Sheehan, MD    Review of Systems  Constitutional: Positive for fatigue. Negative for appetite change.  HENT: Negative for congestion, postnasal drip and sore throat.   Eyes: Negative.   Respiratory: Positive for shortness of breath. Negative for cough, chest tightness and wheezing.   Cardiovascular: Positive for palpitations and leg swelling. Negative for chest pain.  Gastrointestinal: Negative for abdominal distention and abdominal pain.  Endocrine: Negative.   Genitourinary: Negative.   Musculoskeletal: Positive for back pain (going to pain clinic). Negative for neck pain.  Skin: Negative.   Allergic/Immunologic: Negative.   Neurological: Positive for light-headedness (with low blood sugar). Negative for dizziness.  Hematological: Negative for adenopathy. Bruises/bleeds easily.  Psychiatric/Behavioral: Negative for dysphoric mood and sleep disturbance (sleeping with CPAP). The patient is not nervous/anxious.    Vitals:   05/08/17 1041  BP: (!) 148/58  Pulse: 72  Resp: 18  SpO2: 100%  Weight: 226 lb 8 oz (102.7 kg)  Height: 5\' 1"  (1.549 m)   Wt Readings from Last 3 Encounters:  05/08/17 226 lb 8 oz (102.7 kg)  01/09/17 222 lb 8 oz (100.9 kg)  12/17/16 222 lb 6 oz (100.9 kg)   Lab Results  Component Value Date   CREATININE 1.25 (H) 12/17/2016   CREATININE 1.12 (H) 10/13/2016   CREATININE 1.25 (H) 10/11/2016    Physical Exam  Constitutional: She is oriented to person, place, and time. She  appears well-developed and well-nourished.  HENT:  Head: Normocephalic  and atraumatic.  Neck: Normal range of motion. Neck supple. No JVD present.  Cardiovascular: Normal rate and regular rhythm.  Pulmonary/Chest: Effort normal. She has no wheezes. She has no rales.  Abdominal: Soft. She exhibits no distension. There is no tenderness.  Musculoskeletal: She exhibits edema (2+ pitting edema around both ankles). She exhibits no tenderness.  Neurological: She is alert and oriented to person, place, and time.  Skin: Skin is warm and dry.  Psychiatric: She has a normal mood and affect. Her behavior is normal. Thought content normal.  Nursing note and vitals reviewed.  Assessment & Plan:  1: Chronic heart failure with preserved ejection fraction- - NYHA class III - mildly fluid overloaded with edema but she didn't take her diuretic at all yesterday - has been weighing daily and reports a fluctuating weight. Reminded to call for an overnight weight gain of >2 pounds or a weekly weight gain of >5 pounds - weight up 4 pounds since she was last here (November 2018) - did not take her diuretic yesterday because she thought she was going to do some travelling. Says that it's infrequent when she doesn't take it.  - uses lite salt; Discussed the importance of closely following a 2000mg  sodium diet and not adding any salt to her food - saw cardiologist Rockey Situ) 11/15/16 - maintain fluid intake to 40-60 ounces daily - saw pulmonologist Raul Del) 05/06/17 - PharmD reconciled medications with the patient  2: HTN- - BP looks good today - she is taking metoprolol succinate 25mg  BID as taking it all at one time caused dizziness - saw PCP Sabra Heck) 04/15/17 - BMP on 04/08/17 [facility]) reviewed and showed sodium 143, potassium 4.1 and GFR 37   3: Obstructive sleep apnea- - wearing CPAP nightly  4: Lymphedema- - does elevate her legs but edema persists - unable to wear compression socks as she's  unable to bend over very far due to hardware in her back - will make a referral for the lymphapress compression boots - limited in exercise due to her shortness of breath & back surgery  5: Diabetes- - fasting glucose at home this morning was 141 - A1c on 04/02/17 was 6.7%  Patient did not bring her medications nor a list. Each medication was verbally reviewed with the patient and she was encouraged to bring the bottles to every visit to confirm accuracy of list.  Return in 6 months or sooner for any questions/problems before then.

## 2017-06-12 NOTE — Progress Notes (Signed)
Cardiology Office Note  Date:  06/13/2017   ID:  [name], DOB [birth year], MRN [id number]  PCP:  Rusty Aus, MD   Chief Complaint  Patient presents with  . other    6 month follow up. Meds reviewed by the pt. verbally. Pt. c/o shortness of breath & LE edema.     HPI:  72 year old woman with  CAD,  cardiac catheterization in 2009 and June 2013, moderate LAD, diagonal and RCA disease, Obesity,  diabetes,  hyperlipidemia with statin intolerance, on praluent obstructive sleep apnea on CPAP,  Tachycardia episodes,  previous leg edema,  seen in the hospital for severe hypertension and chest pain,  EF 60% in 07/2016 who presents for routine followup of her coronary artery disease and diastolic CHF  In follow-up today she reports that her leg swelling has dramatically improved Using lymphedema compression compression pumps 1 hour twice a day  Low heart rate at night,  Concerned about heart rate of 40 when she is sleeping  Using her apple watch to measure her heart rate at night Uses her CPAP at night Asymptomatic  Extremely fatigued, unable to do anything Not able to walk very far secondary to shortness of breath, chronic pain  Chronic back pain, hip She feels some of her shortness of breath is secondary to chronic elevation of the right anterior hemidiaphragm.  This presented after shoulder surgery  Weight high, unable to lose weight through exercise No access to swimming pool  Has disability placard Has cramps in her legs and feet Taking ranexa 1/2 pill BID, denies any anginal symptoms  On praluent Total chol 181 Torsemide BID CR 1.4 , wonders if she can decrease the torsemide 20 twice daily down to lower dose Potassium 4.1  Previous fall left wrist fracture, required placement of plate Thin ligament injury after moving her car seat  Completed a tendon transfer  Following anesthesia developed pneumonia, longer hospital stay  Felt weak and deconditioned,  still has not recovered  History of tachycardia, acute tachycardia, rate to 140, afternoon, early evening Takes metoprolol succinate twice a day  EKG personally reviewed by myself on todays visit Shows normal sinus rhythm rate 78 bpm no significant ST or T-wave changes  Other past medical history reviewed  Episode of pyelonephritis May 2018 Back in the hospital June 6073 with diastolic CHF, shortness of breath Also with bronchitis requiring steroids She was at the beach eating out every night, had significant weight gain and was not taking Lasix  In follow-up she is taking Lasix 20 mg daily, sometimes 40 mg for ankle swelling or weight gain  Suffered a fall resulting in Left wrist fracture, required a placement of plate Then ligamental injury after moving her car seat Reports she needs a tendon transfer  Dry weight 218 here, At home 219  Lab work reviewed with her HBa1C 6.2 Total cholesterol down to 170, previously 230s She is taking praluent  EKG personally reviewed by myself on todays visit Shows normal sinus rhythm with rate 85 bpm no significant ST or T-wave changes  Other past medical history reviewed Had back surgery 11/14/2015 Went to rehab Got postop infection, ecoli, sepsis crp 30, sed rate 130 Temp in October 2017 Drain placed 02/15/2016, rare ecoli on ABX, picc line in place Still with significant back pain No fevers,  Insulin has been increased.  Other past medical history using her CPAP. Previous blood work before praluent, total cholesterol 238, triglycerides 462, hemoglobin A1c 7.0 She was unable  to tolerate Vytorin as well as other statins  Admission on October 1, discharge on November 14 2010. She ruled out by cardiac enzymes, chest CT was negative for PE. She had a stress test that showed no ischemia. Her medications were adjusted. CT Scan did show fatty infiltration of the liver, granulomatous lesion of the left lower lobe  cardiac catheterization  07/25/2011 for chest pain  This showed left dominant coronary system with moderate mid LAD, proximal diagonal #1 and proximal RCA disease all estimated at 50%, normal LV systolic function.  PMH:   has a past medical history of Anginal pain ([place]), Anxiety, Arthritis, CHF (congestive heart failure) ([place]), Chronic back pain, Constipation, Coronary artery disease, Depression, Diabetes mellitus, Diverticulosis, E coli infection, GERD (gastroesophageal reflux disease), Headache, Headache (02/16/2016), Hemorrhoids, History of colon polyps, History of gout, History of hiatal hernia, History of vertigo, Hyperlipidemia, Hypertension, Hypothyroidism, Insomnia, Joint pain, Joint swelling, Muscle spasm, NSVT (nonsustained ventricular tachycardia) (HCC), OSA on CPAP, Periodic heart flutter, Peripheral edema, Peripheral vascular disease ([place]), Restless leg, Rosacea, Sleep apnea, Urinary incontinence, Urinary urgency, Varicose veins, and Weakness.  PSH:    Past Surgical History:  Procedure Laterality Date  . ABDOMINAL HYSTERECTOMY     with BSo  . CARDIAC CATHETERIZATION  2013   Normal  . CARPAL TUNNEL RELEASE Bilateral   . CATARACT EXTRACTION, BILATERAL    . CHOLECYSTECTOMY    . COLONOSCOPY    . DIAGNOSTIC LAPAROSCOPY     multiple times  . DILATION AND CURETTAGE OF UTERUS    . ESOPHAGOGASTRODUODENOSCOPY (EGD) WITH PROPOFOL N/A 11/01/2014   Procedure: ESOPHAGOGASTRODUODENOSCOPY (EGD) WITH PROPOFOL;  Surgeon: Hulen Luster, MD;  Location: [facility] ENDOSCOPY;  Service: Gastroenterology;  Laterality: N/A;  . HARDWARE REMOVAL Left 10/11/2016   Procedure: HARDWARE REMOVAL-LEFT RADIUS;  Surgeon: Lovell Sheehan, MD;  Location: [facility] ORS;  Service: Orthopedics;  Laterality: Left;  Left Radius Wrist   . IMPLANTABLE CONTACT LENS IMPLANTATION     bilateral  . LAMINECTOMY  11/13/2015  . LUMBAR FUSION  11/2015  . LUMBAR WOUND DEBRIDEMENT N/A 12/02/2015   Procedure: WOUND Exploration;  Surgeon: Consuella Lose, MD;  Location:  [place];  Service: Neurosurgery;  Laterality: N/A;  . OPEN REDUCTION INTERNAL FIXATION (ORIF) DISTAL RADIAL FRACTURE Left 08/29/2016   Procedure: OPEN REDUCTION INTERNAL FIXATION (ORIF) DISTAL RADIAL FRACTURE;  Surgeon: Lovell Sheehan, MD;  Location: [facility] ORS;  Service: Orthopedics;  Laterality: Left;  . PICC LINE PLACE PERIPHERAL ([place] HX)     right upper arm   . ROTATOR CUFF REPAIR Left   . SAVORY DILATION N/A 11/01/2014   Procedure: SAVORY DILATION;  Surgeon: Hulen Luster, MD;  Location: [facility] ENDOSCOPY;  Service: Gastroenterology;  Laterality: N/A;  . TONSILLECTOMY    . TRIGGER FINGER RELEASE Bilateral     Current Outpatient Medications  Medication Sig Dispense Refill  . albuterol (PROVENTIL HFA;VENTOLIN HFA) 108 (90 Base) MCG/ACT inhaler Inhale 2 puffs into the lungs every 6 (six) hours as needed for wheezing or shortness of breath. 3 Inhaler 3  . Alirocumab (PRALUENT) 150 MG/ML SOPN Inject 1 pen into the skin every 14 (fourteen) days. 6 pen 3  . aspirin 81 MG tablet Take 81 mg by mouth at bedtime.     . Azelastine HCl (ASTEPRO) 0.15 % SOLN Place 2 sprays into both nostrils at bedtime.     . Cholecalciferol (VITAMIN D) 2000 units tablet Take 2,000 Units by mouth daily.    [name] desonide (DESOWEN) 0.05 % lotion Apply  1 application topically daily.    [name] esomeprazole (NEXIUM) 40 MG capsule Take 40 mg by mouth 3 (three) times daily before meals.     . gabapentin (NEURONTIN) 300 MG capsule Take 300 mg by mouth 3 (three) times daily.     . hyoscyamine (LEVSIN SL) 0.125 MG SL tablet Place 2 tablets (0.25 mg total) under the tongue every 4 (four) hours as needed for cramping. 30 tablet 0  . insulin aspart (NOVOLOG) 100 UNIT/ML FlexPen Inject 10-16 Units into the skin See admin instructions. 10 units with breakfast, 10 units with lunch, 16 units at dinner, >200 increase by 2 units.    . insulin glargine (LANTUS) 100 UNIT/ML injection Inject 32 Units into the skin daily.     [name] ipratropium-albuterol (DUONEB)  0.5-2.5 (3) MG/3ML SOLN Take 3 mLs by nebulization every 6 (six) hours. (Patient taking differently: Take 3 mLs by nebulization every 6 (six) hours as needed. ) 360 mL 0  . levothyroxine (SYNTHROID, LEVOTHROID) 75 MCG tablet Take 75 mcg by mouth daily before breakfast.     . metoprolol succinate (TOPROL-XL) 25 MG 24 hr tablet Take 1 tablet (25 mg total) by mouth 2 (two) times daily. 180 tablet 3  . nitroGLYCERIN (NITROSTAT) 0.4 MG SL tablet Place 1 tablet (0.4 mg total) under the tongue every 5 (five) minutes as needed for chest pain. 25 tablet 3  . potassium chloride SA (K-DUR,KLOR-CON) 20 MEQ tablet Take 1 tablet (20 mEq total) by mouth daily. 90 tablet 3  . ranolazine (RANEXA) 1000 MG SR tablet Take 1 tablet (1,000 mg total) by mouth 2 (two) times daily. 180 tablet 3  . ropinirole (REQUIP) 5 MG tablet Take 5 mg by mouth 2 (two) times daily. Takes in afternoon and evening    . temazepam (RESTORIL) 30 MG capsule Take 30 mg by mouth at bedtime.    . torsemide (DEMADEX) 20 MG tablet Take 1 tablet (20 mg total) by mouth daily. 90 tablet 3  . traMADol (ULTRAM) 50 MG tablet Take 50 mg by mouth every 6 (six) hours as needed.    . etodolac (LODINE) 400 MG tablet Take 400 mg by mouth 2 (two) times daily.    [name] ezetimibe (ZETIA) 10 MG tablet Take 1 tablet (10 mg total) by mouth daily. 90 tablet 3  . lisinopril (PRINIVIL,ZESTRIL) 5 MG tablet Take 1 tablet (5 mg total) by mouth daily. 90 tablet 3   No current facility-administered medications for this visit.    Facility-Administered Medications Ordered in Other Visits  Medication Dose Route Frequency Provider Last Rate Last Dose  . dexamethasone (DECADRON) injection 10 mg  10 mg Intravenous Once Lovell Sheehan, MD         Allergies:   Ceftin [cefuroxime axetil]; Codeine; Penicillins; and Vicodin [hydrocodone-acetaminophen]   Social History:  The patient  reports that she has never smoked. She has never used smokeless tobacco. She reports that she does  not drink alcohol or use drugs.   Family History:   family history includes Breast cancer (age of onset: 44) in her mother; Heart attack in her sister; Heart disease in her brother, father, mother, and sister.    Review of Systems: Review of Systems  Constitutional: Positive for malaise/fatigue.       Weight gain  Respiratory: Positive for shortness of breath.   Gastrointestinal: Negative.   Musculoskeletal: Positive for joint pain.       Left wrist pain  Neurological: Negative.   Psychiatric/Behavioral: Negative.  All other systems reviewed and are negative.    PHYSICAL EXAM: VS:  BP 140/70 (BP Location: Left Arm, Patient Position: Sitting, Cuff Size: Normal)   Pulse 78   Ht 5\' 1"  (1.549 m)   Wt 227 lb 12 oz (103.3 kg)   BMI 43.03 kg/m  , BMI Body mass index is 43.03 kg/m. Constitutional:  oriented to person, place, and time. No distress. Morbidly obese HENT:  Head: Normocephalic and atraumatic.  Eyes:  no discharge. No scleral icterus.  Neck: Normal range of motion. Neck supple. No JVD present.  Cardiovascular: Normal rate, regular rhythm, normal heart sounds and intact distal pulses. Exam reveals no gallop and no friction rub. No edema No murmur heard. Pulmonary/Chest: Effort normal and breath sounds normal. No stridor. No respiratory distress.  no wheezes.  no rales.  no tenderness.  Abdominal: Soft.  no distension.  no tenderness.  Musculoskeletal: Normal range of motion.  no  tenderness or deformity.  Neurological:  normal muscle tone. Coordination normal. No atrophy Skin: Skin is warm and dry. No rash noted. not diaphoretic.  Psychiatric:  normal mood and affect. behavior is normal. Thought content normal.    Recent Labs: 06/23/2016: ALT 21 07/25/2016: B Natriuretic Peptide 68.0 07/27/2016: Magnesium 2.1 10/14/2016: Hemoglobin 10.1; Platelets 229 12/17/2016: BUN 23; Creatinine, Ser 1.25; Potassium 4.1; Sodium 140    Lipid Panel Lab Results  Component Value Date    CHOL 163 07/26/2015   HDL 32 (L) 07/26/2015   LDLCALC 94 07/26/2015   TRIG 187 (H) 07/26/2015      Wt Readings from Last 3 Encounters:  06/13/17 227 lb 12 oz (103.3 kg)  05/08/17 226 lb 8 oz (102.7 kg)  01/09/17 222 lb 8 oz (100.9 kg)      ASSESSMENT AND PLAN:  Coronary artery disease involving native coronary artery of native heart without angina pectoris - Plan: EKG 12-Lead Currently with no symptoms of angina. No further workup at this time. Continue current medication regimen.  Stable We will add Zetia for better lipid control  Essential hypertension Blood pressure is well controlled on today's visit. No changes made to the medications. Stable .  Will decrease torsemide down to 1 a day  Mixed hyperlipidemia On praluent 150 mg, every two weeks Dramatic improvement in her cholesterol numbers,  Still not at goal, we will add Zetia 10 mg daily  Tachycardia She is concerned about bradycardia when she is sleeping at nighttime but asymptomatic Offered a event monitor or Holter monitor for further review Suggested she could try to take to metoprolol in the morning rather than 25 twice daily Or decrease the dose down in the morning to 37.5 mg She is concerned about tachycardia  Chronic diastolic CHF  Leg swelling improved with lymphedema compression pumps Creatinine up to 1.4 on torsemide 20 twice daily, above her baseline Recommend she decrease torsemide down to 20 daily heading into the summer  Type 2 diabetes mellitus with other circulatory complication, with long-term current use of insulin (HCC) Unable to lose weight, chronic pain, deconditioning We have offered pulmonary rehab, she does not want this, concerned about pain  Does not have access to swimming pool for low impact exercise  OSA on CPAP Tolerated CPAP   Total encounter time more than 45 minutes  Greater than 50% was spent in counseling and coordination of care with the patient   Disposition:   F/U   12 months   Orders Placed This Encounter  Procedures  . EKG 12-Lead  Signed, Esmond Plants, M.D., Ph.D. 06/13/2017  [place], [place]

## 2017-06-13 ENCOUNTER — Ambulatory Visit (INDEPENDENT_AMBULATORY_CARE_PROVIDER_SITE_OTHER): Payer: Medicare Other | Admitting: Cardiovascular Disease

## 2017-06-13 ENCOUNTER — Encounter: Payer: Self-pay | Admitting: Cardiovascular Disease

## 2017-06-13 VITALS — BP 140/70 | HR 78 | Ht 61.0 in | Wt 227.8 lb

## 2017-06-13 DIAGNOSIS — I890 Lymphedema, not elsewhere classified: Secondary | ICD-10-CM | POA: Diagnosis not present

## 2017-06-13 DIAGNOSIS — Z794 Long term (current) use of insulin: Secondary | ICD-10-CM

## 2017-06-13 DIAGNOSIS — E782 Mixed hyperlipidemia: Secondary | ICD-10-CM

## 2017-06-13 DIAGNOSIS — E1122 Type 2 diabetes mellitus with diabetic chronic kidney disease: Secondary | ICD-10-CM | POA: Diagnosis not present

## 2017-06-13 DIAGNOSIS — I5032 Chronic diastolic (congestive) heart failure: Secondary | ICD-10-CM | POA: Diagnosis not present

## 2017-06-13 DIAGNOSIS — I25118 Atherosclerotic heart disease of native coronary artery with other forms of angina pectoris: Secondary | ICD-10-CM | POA: Diagnosis not present

## 2017-06-13 DIAGNOSIS — N183 Chronic kidney disease, stage 3 (moderate): Secondary | ICD-10-CM

## 2017-06-13 DIAGNOSIS — Z9989 Dependence on other enabling machines and devices: Secondary | ICD-10-CM | POA: Diagnosis not present

## 2017-06-13 DIAGNOSIS — G4733 Obstructive sleep apnea (adult) (pediatric): Secondary | ICD-10-CM

## 2017-06-13 MED ORDER — LISINOPRIL 5 MG PO TABS: 5.0000 mg | ORAL_TABLET | Freq: Every day | ORAL | 3 refills | Status: DC

## 2017-06-13 MED ORDER — EZETIMIBE 10 MG PO TABS: 10.0000 mg | ORAL_TABLET | Freq: Every day | ORAL | 3 refills | Status: DC

## 2017-06-13 MED ORDER — POTASSIUM CHLORIDE CRYS ER 20 MEQ PO TBCR: 20.0000 meq | EXTENDED_RELEASE_TABLET | Freq: Every day | ORAL | 3 refills | Status: DC

## 2017-06-13 MED ORDER — TORSEMIDE 20 MG PO TABS: 20.0000 mg | ORAL_TABLET | Freq: Every day | ORAL | 3 refills | Status: DC

## 2017-06-13 MED ORDER — METOPROLOL SUCCINATE ER 25 MG PO TB24: 25.0000 mg | ORAL_TABLET | Freq: Two times a day (BID) | ORAL | 3 refills | Status: DC

## 2017-06-13 MED ORDER — ALIROCUMAB 150 MG/ML ~~LOC~~ SOPN: 1.0000 "pen " | PEN_INJECTOR | SUBCUTANEOUS | 3 refills | Status: DC

## 2017-06-13 MED ORDER — RANOLAZINE ER 1000 MG PO TB12: 1000.0000 mg | ORAL_TABLET | Freq: Two times a day (BID) | ORAL | 3 refills | Status: DC

## 2017-06-13 NOTE — Patient Instructions (Addendum)
Medication Instructions:   Please decrease the torsemide down to one a day Down on potassium to one a day  Please start zetia one a day for cholesterol  Labwork:  No new labs needed  Testing/Procedures:  No further testing at this time   Follow-Up: It was a pleasure seeing you in the office today. Please call us if you have new issues that need to be addressed before your next appt.  [phone]  Your physician wants you to follow-up in: 12 months.  You will receive a reminder letter in the mail two months in advance. If you don't receive a letter, please call our office to schedule the follow-up appointment.  If you need a refill on your cardiac medications before your next appointment, please call your pharmacy.  For educational health videos Log in to : [email or web address] Or : [email or web address], password : [email or web address]

## 2017-06-17 ENCOUNTER — Telehealth: Payer: Self-pay | Admitting: Cardiovascular Disease

## 2017-06-17 DIAGNOSIS — R000 Tachycardia, unspecified: Secondary | ICD-10-CM

## 2017-06-17 NOTE — Telephone Encounter (Signed)
Spoke with patient and reviewed that based on her reported symptoms she should reconsider wearing a monitor for 2 weeks. She was agreeable with this plan but reports that she will be out of town through the end of this month and would need to do this first week of next month. Advised that I would put in order for monitor and would have scheduling give her a call to get this set up for her. She was appreciative for the call with no further questions at this time. She requested that we call [phone] because her power is currently out.

## 2017-06-17 NOTE — Telephone Encounter (Signed)
Patient has had a new symptom since seeing Dr. Rockey Situ on 5/2 She has had an episode of a-fib and according to her apple watch her heart rate has been up She has also been extremely SOB Would like to discuss with nurse to see what Dr. Rockey Situ recommends Please call

## 2017-06-18 NOTE — Telephone Encounter (Signed)
Will schedule when June schedule is out.

## 2017-06-24 NOTE — Telephone Encounter (Signed)
Pt scheduled for 07/15/17

## 2017-06-24 NOTE — Telephone Encounter (Signed)
Lmov for patient to call back and schedule  °

## 2017-07-15 ENCOUNTER — Ambulatory Visit (INDEPENDENT_AMBULATORY_CARE_PROVIDER_SITE_OTHER): Payer: Medicare Other

## 2017-07-15 DIAGNOSIS — R000 Tachycardia, unspecified: Secondary | ICD-10-CM | POA: Diagnosis not present

## 2017-07-16 ENCOUNTER — Observation Stay
Admission: EM | Admit: 2017-07-16 | Discharge: 2017-07-17 | Disposition: A | Discharge status: Home / Self Care | Payer: Medicare Other | Attending: Internal Medicine | Admitting: Internal Medicine

## 2017-07-16 ENCOUNTER — Emergency Department: Payer: Medicare Other

## 2017-07-16 ENCOUNTER — Encounter: Payer: Self-pay | Admitting: *Deleted

## 2017-07-16 ENCOUNTER — Other Ambulatory Visit: Payer: Self-pay

## 2017-07-16 DIAGNOSIS — Z7982 Long term (current) use of aspirin: Secondary | ICD-10-CM | POA: Insufficient documentation

## 2017-07-16 DIAGNOSIS — G4700 Insomnia, unspecified: Secondary | ICD-10-CM | POA: Insufficient documentation

## 2017-07-16 DIAGNOSIS — M549 Dorsalgia, unspecified: Secondary | ICD-10-CM | POA: Insufficient documentation

## 2017-07-16 DIAGNOSIS — R51 Headache: Secondary | ICD-10-CM | POA: Diagnosis not present

## 2017-07-16 DIAGNOSIS — I25110 Atherosclerotic heart disease of native coronary artery with unstable angina pectoris: Secondary | ICD-10-CM | POA: Diagnosis not present

## 2017-07-16 DIAGNOSIS — I2510 Atherosclerotic heart disease of native coronary artery without angina pectoris: Secondary | ICD-10-CM | POA: Diagnosis present

## 2017-07-16 DIAGNOSIS — Z6841 Body Mass Index (BMI) 40.0 and over, adult: Secondary | ICD-10-CM | POA: Diagnosis not present

## 2017-07-16 DIAGNOSIS — R0789 Other chest pain: Principal | ICD-10-CM | POA: Insufficient documentation

## 2017-07-16 DIAGNOSIS — I200 Unstable angina: Secondary | ICD-10-CM | POA: Diagnosis present

## 2017-07-16 DIAGNOSIS — Z86010 Personal history of colonic polyps: Secondary | ICD-10-CM | POA: Insufficient documentation

## 2017-07-16 DIAGNOSIS — G4733 Obstructive sleep apnea (adult) (pediatric): Secondary | ICD-10-CM

## 2017-07-16 DIAGNOSIS — I472 Ventricular tachycardia: Secondary | ICD-10-CM | POA: Insufficient documentation

## 2017-07-16 DIAGNOSIS — M1990 Unspecified osteoarthritis, unspecified site: Secondary | ICD-10-CM | POA: Insufficient documentation

## 2017-07-16 DIAGNOSIS — F329 Major depressive disorder, single episode, unspecified: Secondary | ICD-10-CM | POA: Insufficient documentation

## 2017-07-16 DIAGNOSIS — E1151 Type 2 diabetes mellitus with diabetic peripheral angiopathy without gangrene: Secondary | ICD-10-CM | POA: Insufficient documentation

## 2017-07-16 DIAGNOSIS — Z90710 Acquired absence of both cervix and uterus: Secondary | ICD-10-CM | POA: Insufficient documentation

## 2017-07-16 DIAGNOSIS — I10 Essential (primary) hypertension: Secondary | ICD-10-CM | POA: Diagnosis present

## 2017-07-16 DIAGNOSIS — K219 Gastro-esophageal reflux disease without esophagitis: Secondary | ICD-10-CM | POA: Insufficient documentation

## 2017-07-16 DIAGNOSIS — M109 Gout, unspecified: Secondary | ICD-10-CM | POA: Insufficient documentation

## 2017-07-16 DIAGNOSIS — Z888 Allergy status to other drugs, medicaments and biological substances status: Secondary | ICD-10-CM | POA: Insufficient documentation

## 2017-07-16 DIAGNOSIS — G8929 Other chronic pain: Secondary | ICD-10-CM | POA: Diagnosis not present

## 2017-07-16 DIAGNOSIS — G2581 Restless legs syndrome: Secondary | ICD-10-CM | POA: Diagnosis not present

## 2017-07-16 DIAGNOSIS — R627 Adult failure to thrive: Secondary | ICD-10-CM | POA: Insufficient documentation

## 2017-07-16 DIAGNOSIS — I5032 Chronic diastolic (congestive) heart failure: Secondary | ICD-10-CM | POA: Diagnosis not present

## 2017-07-16 DIAGNOSIS — M5136 Other intervertebral disc degeneration, lumbar region: Secondary | ICD-10-CM | POA: Insufficient documentation

## 2017-07-16 DIAGNOSIS — Z7951 Long term (current) use of inhaled steroids: Secondary | ICD-10-CM | POA: Insufficient documentation

## 2017-07-16 DIAGNOSIS — Z9989 Dependence on other enabling machines and devices: Secondary | ICD-10-CM

## 2017-07-16 DIAGNOSIS — E039 Hypothyroidism, unspecified: Secondary | ICD-10-CM | POA: Diagnosis not present

## 2017-07-16 DIAGNOSIS — I110 Hypertensive heart disease with heart failure: Secondary | ICD-10-CM | POA: Insufficient documentation

## 2017-07-16 DIAGNOSIS — Z981 Arthrodesis status: Secondary | ICD-10-CM | POA: Insufficient documentation

## 2017-07-16 DIAGNOSIS — N3941 Urge incontinence: Secondary | ICD-10-CM | POA: Diagnosis not present

## 2017-07-16 DIAGNOSIS — Z794 Long term (current) use of insulin: Secondary | ICD-10-CM | POA: Insufficient documentation

## 2017-07-16 DIAGNOSIS — L02212 Cutaneous abscess of back [any part, except buttock]: Secondary | ICD-10-CM | POA: Insufficient documentation

## 2017-07-16 DIAGNOSIS — E785 Hyperlipidemia, unspecified: Secondary | ICD-10-CM | POA: Diagnosis not present

## 2017-07-16 DIAGNOSIS — M4800 Spinal stenosis, site unspecified: Secondary | ICD-10-CM | POA: Insufficient documentation

## 2017-07-16 DIAGNOSIS — K449 Diaphragmatic hernia without obstruction or gangrene: Secondary | ICD-10-CM | POA: Insufficient documentation

## 2017-07-16 DIAGNOSIS — F419 Anxiety disorder, unspecified: Secondary | ICD-10-CM | POA: Diagnosis present

## 2017-07-16 DIAGNOSIS — Z9841 Cataract extraction status, right eye: Secondary | ICD-10-CM | POA: Insufficient documentation

## 2017-07-16 DIAGNOSIS — Z9842 Cataract extraction status, left eye: Secondary | ICD-10-CM | POA: Insufficient documentation

## 2017-07-16 DIAGNOSIS — Z79899 Other long term (current) drug therapy: Secondary | ICD-10-CM | POA: Insufficient documentation

## 2017-07-16 DIAGNOSIS — I714 Abdominal aortic aneurysm, without rupture: Secondary | ICD-10-CM | POA: Insufficient documentation

## 2017-07-16 DIAGNOSIS — Z880 Allergy status to penicillin: Secondary | ICD-10-CM | POA: Insufficient documentation

## 2017-07-16 DIAGNOSIS — M419 Scoliosis, unspecified: Secondary | ICD-10-CM | POA: Insufficient documentation

## 2017-07-16 DIAGNOSIS — E781 Pure hyperglyceridemia: Secondary | ICD-10-CM | POA: Insufficient documentation

## 2017-07-16 DIAGNOSIS — L719 Rosacea, unspecified: Secondary | ICD-10-CM | POA: Insufficient documentation

## 2017-07-16 DIAGNOSIS — I25118 Atherosclerotic heart disease of native coronary artery with other forms of angina pectoris: Secondary | ICD-10-CM | POA: Diagnosis present

## 2017-07-16 DIAGNOSIS — E119 Type 2 diabetes mellitus without complications: Secondary | ICD-10-CM

## 2017-07-16 DIAGNOSIS — R079 Chest pain, unspecified: Secondary | ICD-10-CM | POA: Diagnosis present

## 2017-07-16 DIAGNOSIS — Z885 Allergy status to narcotic agent status: Secondary | ICD-10-CM | POA: Insufficient documentation

## 2017-07-16 DIAGNOSIS — R002 Palpitations: Secondary | ICD-10-CM | POA: Insufficient documentation

## 2017-07-16 HISTORY — DX: Unspecified diastolic (congestive) heart failure: I50.30

## 2017-07-16 LAB — CBC WITH DIFFERENTIAL/PLATELET
Basophils Absolute: 0 10*3/uL (ref 0–0.1)
Basophils Relative: 0 %
Eosinophils Absolute: 0.1 10*3/uL (ref 0–0.7)
Eosinophils Relative: 1 %
HCT: 36.4 % (ref 35.0–47.0)
HEMOGLOBIN: 12.3 g/dL (ref 12.0–16.0)
LYMPHS ABS: 2.4 10*3/uL (ref 1.0–3.6)
Lymphocytes Relative: 32 %
MCH: 30.9 pg (ref 26.0–34.0)
MCHC: 33.8 g/dL (ref 32.0–36.0)
MCV: 91.3 fL (ref 80.0–100.0)
MONOS PCT: 7 %
Monocytes Absolute: 0.5 10*3/uL (ref 0.2–0.9)
NEUTROS ABS: 4.5 10*3/uL (ref 1.4–6.5)
NEUTROS PCT: 60 %
Platelets: 270 10*3/uL (ref 150–440)
RBC: 3.98 MIL/uL (ref 3.80–5.20)
RDW: 14.6 % — ABNORMAL HIGH (ref 11.5–14.5)
WBC: 7.5 10*3/uL (ref 3.6–11.0)

## 2017-07-16 LAB — BASIC METABOLIC PANEL
ANION GAP: 9 (ref 5–15)
BUN: 19 mg/dL (ref 6–20)
CO2: 27 mmol/L (ref 22–32)
Calcium: 8.8 mg/dL — ABNORMAL LOW (ref 8.9–10.3)
Chloride: 101 mmol/L (ref 101–111)
Creatinine, Ser: 1.29 mg/dL — ABNORMAL HIGH (ref 0.44–1.00)
GFR calc Af Amer: 47 mL/min — ABNORMAL LOW (ref >60)
GFR calc non Af Amer: 40 mL/min — ABNORMAL LOW (ref >60)
GLUCOSE: 185 mg/dL — AB (ref 65–99)
POTASSIUM: 3.6 mmol/L (ref 3.5–5.1)
Sodium: 137 mmol/L (ref 135–145)

## 2017-07-16 LAB — GLUCOSE, CAPILLARY: GLUCOSE-CAPILLARY: 135 mg/dL — AB (ref 65–99)

## 2017-07-16 LAB — TROPONIN I: Troponin I: <0.03 ng/mL (ref <0.03)

## 2017-07-16 MED ORDER — IPRATROPIUM-ALBUTEROL 0.5-2.5 (3) MG/3ML IN SOLN: 3.0000 mL | Freq: Four times a day (QID) | RESPIRATORY_TRACT | Status: DC | PRN

## 2017-07-16 MED ORDER — ACETAMINOPHEN 500 MG PO TABS
1000.0000 mg | ORAL_TABLET | Freq: Once | ORAL | Status: AC
  Administered 2017-07-16: 1000 mg via ORAL
  Filled 2017-07-16: qty 2

## 2017-07-16 MED ORDER — ACETAMINOPHEN 650 MG RE SUPP: 650.0000 mg | Freq: Four times a day (QID) | RECTAL | Status: DC | PRN

## 2017-07-16 MED ORDER — GABAPENTIN 300 MG PO CAPS
300.0000 mg | ORAL_CAPSULE | Freq: Three times a day (TID) | ORAL | Status: DC
  Administered 2017-07-16 – 2017-07-17 (×3): 300 mg via ORAL
  Filled 2017-07-16 (×3): qty 1

## 2017-07-16 MED ORDER — ONDANSETRON HCL 4 MG/2ML IJ SOLN
4.0000 mg | Freq: Four times a day (QID) | INTRAMUSCULAR | Status: DC | PRN
  Administered 2017-07-17: 4 mg via INTRAVENOUS
  Filled 2017-07-16: qty 2

## 2017-07-16 MED ORDER — ONDANSETRON HCL 4 MG/2ML IJ SOLN
4.0000 mg | Freq: Once | INTRAMUSCULAR | Status: AC
  Administered 2017-07-16: 4 mg via INTRAVENOUS

## 2017-07-16 MED ORDER — RANOLAZINE ER 500 MG PO TB12
1000.0000 mg | ORAL_TABLET | Freq: Two times a day (BID) | ORAL | Status: DC
  Administered 2017-07-16 – 2017-07-17 (×2): 1000 mg via ORAL
  Filled 2017-07-16 (×3): qty 2

## 2017-07-16 MED ORDER — HEPARIN (PORCINE) IN NACL 100-0.45 UNIT/ML-% IJ SOLN
850.0000 [IU]/h | INTRAMUSCULAR | Status: DC
  Administered 2017-07-16: 1000 [IU]/h via INTRAVENOUS
  Filled 2017-07-16: qty 250

## 2017-07-16 MED ORDER — LEVOTHYROXINE SODIUM 50 MCG PO TABS
75.0000 ug | ORAL_TABLET | Freq: Every day | ORAL | Status: DC
  Administered 2017-07-17: 75 ug via ORAL
  Filled 2017-07-16: qty 2

## 2017-07-16 MED ORDER — HEPARIN BOLUS VIA INFUSION
4000.0000 [IU] | Freq: Once | INTRAVENOUS | Status: AC
  Administered 2017-07-16: 4000 [IU] via INTRAVENOUS
  Filled 2017-07-16: qty 4000

## 2017-07-16 MED ORDER — ONDANSETRON HCL 4 MG/2ML IJ SOLN
INTRAMUSCULAR | Status: AC
  Administered 2017-07-16: 4 mg via INTRAVENOUS
  Filled 2017-07-16: qty 2

## 2017-07-16 MED ORDER — PANTOPRAZOLE SODIUM 40 MG PO TBEC
40.0000 mg | DELAYED_RELEASE_TABLET | Freq: Two times a day (BID) | ORAL | Status: DC
  Administered 2017-07-16 – 2017-07-17 (×2): 40 mg via ORAL
  Filled 2017-07-16 (×2): qty 1

## 2017-07-16 MED ORDER — TEMAZEPAM 15 MG PO CAPS
30.0000 mg | ORAL_CAPSULE | Freq: Every day | ORAL | Status: DC
  Administered 2017-07-16: 30 mg via ORAL
  Filled 2017-07-16: qty 2

## 2017-07-16 MED ORDER — INSULIN ASPART 100 UNIT/ML ~~LOC~~ SOLN
0.0000 [IU] | Freq: Three times a day (TID) | SUBCUTANEOUS | Status: DC
  Administered 2017-07-17: 2 [IU] via SUBCUTANEOUS
  Filled 2017-07-16: qty 1

## 2017-07-16 MED ORDER — TORSEMIDE 20 MG PO TABS
20.0000 mg | ORAL_TABLET | Freq: Every day | ORAL | Status: DC
  Administered 2017-07-17: 20 mg via ORAL
  Filled 2017-07-16: qty 1

## 2017-07-16 MED ORDER — ROPINIROLE HCL 1 MG PO TABS
5.0000 mg | ORAL_TABLET | Freq: Two times a day (BID) | ORAL | Status: DC
  Administered 2017-07-16 – 2017-07-17 (×2): 5 mg via ORAL
  Filled 2017-07-16 (×2): qty 5

## 2017-07-16 MED ORDER — ONDANSETRON HCL 4 MG PO TABS: 4.0000 mg | ORAL_TABLET | Freq: Four times a day (QID) | ORAL | Status: DC | PRN

## 2017-07-16 MED ORDER — ACETAMINOPHEN 325 MG PO TABS: 650.0000 mg | ORAL_TABLET | Freq: Four times a day (QID) | ORAL | Status: DC | PRN

## 2017-07-16 MED ORDER — NITROGLYCERIN 0.4 MG SL SUBL
0.4000 mg | SUBLINGUAL_TABLET | SUBLINGUAL | Status: DC | PRN
  Administered 2017-07-16 (×3): 0.4 mg via SUBLINGUAL
  Filled 2017-07-16: qty 1

## 2017-07-16 MED ORDER — ASPIRIN EC 81 MG PO TBEC: 81.0000 mg | DELAYED_RELEASE_TABLET | Freq: Every day | ORAL | Status: DC

## 2017-07-16 MED ORDER — INSULIN ASPART 100 UNIT/ML ~~LOC~~ SOLN: 0.0000 [IU] | Freq: Every day | SUBCUTANEOUS | Status: DC

## 2017-07-16 MED ORDER — NITROGLYCERIN IN D5W 200-5 MCG/ML-% IV SOLN
0.0000 ug/min | INTRAVENOUS | Status: DC
  Administered 2017-07-16: 30 ug/min via INTRAVENOUS
  Filled 2017-07-16: qty 250

## 2017-07-16 NOTE — Progress Notes (Signed)
[place] for heparin Indication: chest pain/ACS  Allergies  Allergen Reactions  . Ceftin [Cefuroxime Axetil] Diarrhea  . Codeine Rash       . Penicillins Rash    Has patient had a PCN reaction causing immediate rash, facial/tongue/throat swelling, SOB or lightheadedness with hypotension: NO Has patient had a PCN reaction causing severe rash involving mucus membranes or skin necrosis: NO Has patient had a PCN retioion that required hospitalization NO Has patient had a PCN reaction occurring within the last 10 years: NO If all of the above answers are "NO", then may proceed with Cephalosporin use.  . Vicodin [Hydrocodone-Acetaminophen] Other (See Comments)    passes out    Patient Measurements: Height: 5\' 1"  (154.9 cm) Weight: 229 lb (103.9 kg) IBW/kg (Calculated) : 47.8 Heparin Dosing Weight: 73kg  Vital Signs: Temp: 98 F (36.7 C) (06/04 1851) Temp Source: Oral (06/04 1851) BP: 105/53 (06/04 1940) Pulse Rate: 98 (06/04 1940)  Labs: Recent Labs    07/16/17 1906  HGB 12.3  HCT 36.4  PLT 270    CrCl cannot be calculated (Patient's most recent lab result is older than the maximum 21 days allowed.).   Medical History: Past Medical History:  Diagnosis Date  . Anginal pain ([place])   . Anxiety   . Arthritis   . CHF (congestive heart failure) ([place])   . Chronic back pain    stenosis.degenerative disc,some scoliosis  . Constipation    takes Stool Softener daily  . Coronary artery disease   . Depression    takes Cymbalta daily  . Diabetes mellitus    Type 2 diabetic. Average fasting blood sugar runs high 170-200  . Diverticulosis   . E coli infection   . GERD (gastroesophageal reflux disease)    takes Nexium daily  . Headache   . Headache 02/16/2016  . Hemorrhoids   . History of colon polyps    benign  . History of gout    doesn't take any meds  . History of hiatal hernia   . History of vertigo    doesn't take any meds   . Hyperlipidemia    takes Praluent daily  . Hypertension    currently BP medications are on hold   . Hypothyroidism    takes Synthroid daily  . Insomnia    takes Restoril nightly  . Joint pain   . Joint swelling   . Muscle spasm    takes Robaxin as needed  . NSVT (nonsustained ventricular tachycardia) ([place])   . OSA on CPAP   . Periodic heart flutter   . Peripheral edema    takes LAsix as needed  . Peripheral vascular disease ([place])    AAA as stated per pt / was just discovered and pt states has not been referred to vascular MD   . Restless leg    takes Requip daily  . Rosacea   . Sleep apnea   . Urinary incontinence   . Urinary urgency   . Varicose veins   . Weakness    numbness and tingling.    Assessment: 72 year old female with PMH of angina, CHF, CAD, DM presents with chest pain, troponin pending. She is on no anticoagulants PTA per med rec.  Goal of Therapy:  Heparin level 0.3-0.7 units/ml Monitor platelets by anticoagulation protocol: Yes   Plan:  Give 4000 units bolus x 1 Start heparin infusion at 1000 units/hr  Will order a STAT aPTT, PT/INR and  a heparin level 8 hours after heparin therapy begins  Dallie Piles, PharmD 07/16/2017,7:53 PM

## 2017-07-16 NOTE — H&P (Signed)
[facility] [place] NAME: Citlalic Norlander    MR#:  [id number]  DATE OF BIRTH:  22-Aug-1945  DATE OF ADMISSION:  07/16/2017  PRIMARY CARE PHYSICIAN: Rusty Aus, MD   REQUESTING/REFERRING PHYSICIAN: Joni Fears, MD  CHIEF COMPLAINT:   Chief Complaint  Patient presents with  . Chest Pain    HISTORY OF PRESENT ILLNESS:  Glory Graefe  is a 72 y.o. female who presents with acute onset of chest pain with radiation down her left arm.  Had some associated palpitations and shortness of breath.  She came to ED for evaluation and was found initially to have a negative troponin and largely normal work-up.  However, given her symptoms and risk factors she was started on IV heparin.  Chest pain resolved when she was started on IV nitroglycerin.  Hospitalist were called for admission  PAST MEDICAL HISTORY:   Past Medical History:  Diagnosis Date  . Anginal pain ([place])   . Anxiety   . Arthritis   . CHF (congestive heart failure) ([place])   . Chronic back pain    stenosis.degenerative disc,some scoliosis  . Constipation    takes Stool Softener daily  . Coronary artery disease   . Depression    takes Cymbalta daily  . Diabetes mellitus    Type 2 diabetic. Average fasting blood sugar runs high 170-200  . Diverticulosis   . E coli infection   . GERD (gastroesophageal reflux disease)    takes Nexium daily  . Headache   . Headache 02/16/2016  . Hemorrhoids   . History of colon polyps    benign  . History of gout    doesn't take any meds  . History of hiatal hernia   . History of vertigo    doesn't take any meds  . Hyperlipidemia    takes Praluent daily  . Hypertension    currently BP medications are on hold   . Hypothyroidism    takes Synthroid daily  . Insomnia    takes Restoril nightly  . Joint pain   . Joint swelling   . Muscle spasm    takes Robaxin as needed  . NSVT (nonsustained ventricular tachycardia) ([place])   . OSA on CPAP    . Periodic heart flutter   . Peripheral edema    takes LAsix as needed  . Peripheral vascular disease ([place])    AAA as stated per pt / was just discovered and pt states has not been referred to vascular MD   . Restless leg    takes Requip daily  . Rosacea   . Sleep apnea   . Urinary incontinence   . Urinary urgency   . Varicose veins   . Weakness    numbness and tingling.     PAST SURGICAL HISTORY:   Past Surgical History:  Procedure Laterality Date  . ABDOMINAL HYSTERECTOMY     with BSo  . CARDIAC CATHETERIZATION  2013   Normal  . CARPAL TUNNEL RELEASE Bilateral   . CATARACT EXTRACTION, BILATERAL    . CHOLECYSTECTOMY    . COLONOSCOPY    . DIAGNOSTIC LAPAROSCOPY     multiple times  . DILATION AND CURETTAGE OF UTERUS    . ESOPHAGOGASTRODUODENOSCOPY (EGD) WITH PROPOFOL N/A 11/01/2014   Procedure: ESOPHAGOGASTRODUODENOSCOPY (EGD) WITH PROPOFOL;  Surgeon: Hulen Luster, MD;  Location: [facility] ENDOSCOPY;  Service: Gastroenterology;  Laterality: N/A;  . HARDWARE REMOVAL Left 10/11/2016   Procedure: HARDWARE REMOVAL-LEFT RADIUS;  Surgeon: Lovell Sheehan, MD;  Location: [facility] ORS;  Service: Orthopedics;  Laterality: Left;  Left Radius Wrist   . IMPLANTABLE CONTACT LENS IMPLANTATION     bilateral  . LAMINECTOMY  11/13/2015  . LUMBAR FUSION  11/2015  . LUMBAR WOUND DEBRIDEMENT N/A 12/02/2015   Procedure: WOUND Exploration;  Surgeon: Consuella Lose, MD;  Location: [place];  Service: Neurosurgery;  Laterality: N/A;  . OPEN REDUCTION INTERNAL FIXATION (ORIF) DISTAL RADIAL FRACTURE Left 08/29/2016   Procedure: OPEN REDUCTION INTERNAL FIXATION (ORIF) DISTAL RADIAL FRACTURE;  Surgeon: Lovell Sheehan, MD;  Location: [facility] ORS;  Service: Orthopedics;  Laterality: Left;  . PICC LINE PLACE PERIPHERAL ([place] HX)     right upper arm   . ROTATOR CUFF REPAIR Left   . SAVORY DILATION N/A 11/01/2014   Procedure: SAVORY DILATION;  Surgeon: Hulen Luster, MD;  Location[facility] END[facility]COPY;  Service:  Gastroenterology;  Laterality: N/A;  . TONSILLECTOMY    . TRIGGER FINGER RELEASE Bilateral      SOCIAL HISTORY:   Social History   Tobacco Use  . Smoking status: Never Smoker  . Smokeless tobacco: Never Used  Substance Use Topics  . Alcohol use: No     FAMILY HISTORY:   Family History  Problem Relation Age of Onset  . Heart disease Mother   . Breast cancer Mother 69  . Heart disease Father   . Heart attack Sister   . Heart disease Sister   . Heart disease Brother      DRUG ALLERGIES:   Allergies  Allergen Reactions  . Ceftin [Cefuroxime Axetil] Diarrhea  . Codeine Rash       . Penicillins Rash    Has patient had a PCN reaction causing immediate rash, facial/tongue/throat swelling, SOB or lightheadedness with hypotension: NO Has patient had a PCN reaction causing severe rash involving mucus membranes or skin necrosis: NO Has patient had a PCN retioion that required hospitalization NO Has patient had a PCN reaction occurring within the last 10 years: NO If all of the above answers are "NO", then may proceed with Cephalosporin use.  . Vicodin [Hydrocodone-Acetaminophen] Other (See Comments)    passes out    MEDICATIONS AT HOME:   Prior to Admission medications   Medication Sig Start Date End Date Taking? Authorizing Provider  albuterol (PROVENTIL HFA;VENTOLIN HFA) 108 (90 Base) MCG/ACT inhaler Inhale 2 puffs into the lungs every 6 (six) hours as needed for wheezing or shortness of breath. 01/09/17  Yes Hackney, Tina A, FNP  Alirocumab (PRALUENT) 150 MG/ML SOPN Inject 1 pen into the skin every 14 (fourteen) days. Patient taking differently: Inject 150 mg into the skin every 14 (fourteen) days.  06/13/17  Yes Minna Merritts, MD  aspirin 81 MG tablet Take 81 mg by mouth at bedtime.    Yes [provider]  Azelastine HCl (ASTEPRO) 0.15 % SOLN Place 2 sprays into both nostrils at bedtime.    Yes [provider]  Cholecalciferol (VITAMIN D) 2000  units tablet Take 2,000 Units by mouth daily.   Yes [provider]  desonide (DESOWEN) 0.05 % lotion Apply 1 application topically daily.   Yes [provider]  esomeprazole (NEXIUM) 40 MG capsule Take 40 mg by mouth 2 (two) times daily before a meal.    Yes [provider]  etodolac (LODINE) 400 MG tablet Take 400 mg by mouth 2 (two) times daily. 06/08/17  Yes [provider]  ezetimibe (ZETIA) 10 MG tablet Take  1 tablet (10 mg total) by mouth daily. 06/13/17  Yes Gollan, Kathlene November, MD  gabapentin (NEURONTIN) 300 MG capsule Take 300 mg by mouth 3 (three) times daily.    Yes [provider]  hyoscyamine (LEVSIN SL) 0.125 MG SL tablet Place 2 tablets (0.25 mg total) under the tongue every 4 (four) hours as needed for cramping. 06/25/16  Yes Theodoro Grist, MD  insulin aspart (NOVOLOG) 100 UNIT/ML FlexPen Inject 10-16 Units into the skin See admin instructions. 10 units with breakfast, 10 units with lunch, 16 units at dinner, >200 increase by 2 units. 03/01/15 07/23/17 Yes [provider]  insulin glargine (LANTUS) 100 UNIT/ML injection Inject 32 Units into the skin daily.    Yes [provider]  ipratropium-albuterol (DUONEB) 0.5-2.5 (3) MG/3ML SOLN Take 3 mLs by nebulization every 6 (six) hours. Patient taking differently: Take 3 mLs by nebulization every 6 (six) hours as needed.  10/14/16  Yes Vaughan Basta, MD  levothyroxine (SYNTHROID, LEVOTHROID) 75 MCG tablet Take 75 mcg by mouth daily before breakfast.  11/24/12  Yes [provider]  lisinopril (PRINIVIL,ZESTRIL) 5 MG tablet Take 1 tablet (5 mg total) by mouth daily. 06/13/17  Yes Gollan, Kathlene November, MD  metoprolol succinate (TOPROL-XL) 25 MG 24 hr tablet Take 1 tablet (25 mg total) by mouth 2 (two) times daily. 06/13/17  Yes Gollan, Kathlene November, MD  nitroGLYCERIN (NITROSTAT) 0.4 MG SL tablet Place 1 tablet (0.4 mg total) under the tongue every 5 (five) minutes as needed for chest  pain. 03/06/16 09/18/17 Yes Gollan, Kathlene November, MD  potassium chloride SA (K-DUR,KLOR-CON) 20 MEQ tablet Take 1 tablet (20 mEq total) by mouth daily. 06/13/17  Yes Minna Merritts, MD  ranolazine (RANEXA) 1000 MG SR tablet Take 1 tablet (1,000 mg total) by mouth 2 (two) times daily. 06/13/17  Yes Gollan, Kathlene November, MD  ropinirole (REQUIP) 5 MG tablet Take 5 mg by mouth 2 (two) times daily. Takes in afternoon and evening   Yes [provider]  temazepam (RESTORIL) 30 MG capsule Take 30 mg by mouth at bedtime.   Yes [provider]  torsemide (DEMADEX) 20 MG tablet Take 1 tablet (20 mg total) by mouth daily. 06/13/17  Yes Gollan, Kathlene November, MD  traMADol (ULTRAM) 50 MG tablet Take 50 mg by mouth every 6 (six) hours as needed.   Yes [provider]    REVIEW OF SYSTEMS:  Review of Systems  Constitutional: Negative for chills, fever, malaise/fatigue and weight loss.  HENT: Negative for ear pain, hearing loss and tinnitus.   Eyes: Negative for blurred vision, double vision, pain and redness.  Respiratory: Negative for cough, hemoptysis and shortness of breath.   Cardiovascular: Positive for chest pain and palpitations. Negative for orthopnea and leg swelling.  Gastrointestinal: Negative for abdominal pain, constipation, diarrhea, nausea and vomiting.  Genitourinary: Negative for dysuria, frequency and hematuria.  Musculoskeletal: Negative for back pain, joint pain and neck pain.  Skin:       No acne, rash, or lesions  Neurological: Negative for dizziness, tremors, focal weakness and weakness.  Endo/Heme/Allergies: Negative for polydipsia. Does not bruise/bleed easily.  Psychiatric/Behavioral: Negative for depression. The patient is not nervous/anxious and does not have insomnia.      VITAL SIGNS:   Vitals:   07/16/17 2030 07/16/17 2033 07/16/17 2036 07/16/17 2045  BP: 96/62 (!) 96/53 (!) 93/57 (!) 110/47  Pulse: 95 93 96 90  Resp: 15 13 16 17   Temp:  TempSrc:       SpO2: 94% 92% 95% 95%  Weight:      Height:       Wt Readings from Last 3 Encounters:  07/16/17 103.9 kg (229 lb)  06/13/17 103.3 kg (227 lb 12 oz)  05/08/17 102.7 kg (226 lb 8 oz)    PHYSICAL EXAMINATION:  Physical Exam  Vitals reviewed. Constitutional: She is oriented to person, place, and time. She appears well-developed and well-nourished. No distress.  HENT:  Head: Normocephalic and atraumatic.  Mouth/Throat: Oropharynx is clear and moist.  Eyes: Pupils are equal, round, and reactive to light. Conjunctivae and EOM are normal. No scleral icterus.  Neck: Normal range of motion. Neck supple. No JVD present. No thyromegaly present.  Cardiovascular: Normal rate, regular rhythm and intact distal pulses. Exam reveals no gallop and no friction rub.  No murmur heard. Respiratory: Effort normal and breath sounds normal. No respiratory distress. She has no wheezes. She has no rales.  GI: Soft. Bowel sounds are normal. She exhibits no distension. There is no tenderness.  Musculoskeletal: Normal range of motion. She exhibits no edema.  No arthritis, no gout  Lymphadenopathy:    She has no cervical adenopathy.  Neurological: She is alert and oriented to person, place, and time. No cranial nerve deficit.  No dysarthria, no aphasia  Skin: Skin is warm and dry. No rash noted. No erythema.  Psychiatric: She has a normal mood and affect. Her behavior is normal. Judgment and thought content normal.    LABORATORY PANEL:   CBC Recent Labs  Lab 07/16/17 1906  WBC 7.5  HGB 12.3  HCT 36.4  PLT 270   ------------------------------------------------------------------------------------------------------------------  Chemistries  Recent Labs  Lab 07/16/17 1906  NA 137  K 3.6  CL 101  CO2 27  GLUCOSE 185*  BUN 19  CREATININE 1.29*  CALCIUM 8.8*   ------------------------------------------------------------------------------------------------------------------  Cardiac  Enzymes Recent Labs  Lab 07/16/17 1906  TROPONINI <0.03   ------------------------------------------------------------------------------------------------------------------  RADIOLOGY:  Dg Chest Portable 1 View  Result Date: 07/16/2017 CLINICAL DATA:  Acute onset of central chest pain radiating to left arm approximately 3 hours ago. Nausea. EXAM: PORTABLE CHEST 1 VIEW COMPARISON:  10/14/2016 FINDINGS: The heart size and mediastinal contours are within normal limits. Both lungs are clear. Holter monitor seen overlying the left hemithorax. The visualized skeletal structures are unremarkable. IMPRESSION: No active disease. Electronically Signed   By: Earle Gell M.D.   On: 07/16/2017 19:24    EKG:   Orders placed or performed in visit on 06/13/17  . EKG 12-Lead    IMPRESSION AND PLAN:  Principal Problem:   Chest pain -patient is on IV heparin and nitroglycerin.  Troponin initially negative, second troponin was mildly positive.  Continue to trend cardiac enzymes, get an echocardiogram and cardiology consult. Active Problems:   CAD (coronary artery disease), native coronary artery -continue home meds, other work-up as above   Diabetes mellitus ([place]) -sliding scale insulin with corresponding glucose checks   HTN (hypertension) -continue home medications   Anxiety -home dose anxiolytics   Chronic diastolic heart failure ([place]) -continue home meds   Hyperlipidemia -Home dose antilipid  Chart review performed and case discussed with ED provider. Labs, imaging and/or ECG reviewed by provider and discussed with patient/family. Management plans discussed with the patient and/or family.  DVT PROPHYLAXIS: Systemic anticoagulation  GI PROPHYLAXIS: PPI  ADMISSION STATUS: Observation  CODE STATUS: Full Code Status History    Date Active Date Inactive Code Status Order  ID Comments User Context   10/11/2016 1255 10/14/2016 1535 Full Code [id number]  Fritzi Mandes, MD Inpatient   10/11/2016 1255  10/11/2016 1255 Full Code [id number]  Lovell Sheehan, MD Inpatient   08/29/2016 1629 08/29/2016 2107 Full Code [id number]  Lovell Sheehan, MD Inpatient   07/23/2016 2025 07/28/2016 1757 Full Code [id number]  Hillary Bow, MD ED   06/24/2016 0710 06/25/2016 1917 Full Code [id number]  Saundra Shelling, MD Inpatient   11/14/2015 1833 11/18/2015 1851 Full Code [id number]  Newman Pies, MD Inpatient   09/12/2015 1048 09/13/2015 0315 Full Code [id number]  Logan Bores, MD HOV      TOTAL TIME TAKING CARE OF THIS PATIENT: 40 minutes.   Giani Winther Lowell 07/16/2017, 9:09 PM  [employer]  [phone]  CC: Primary care physician; Rusty Aus, MD  Note:  This document was prepared using Dragon voice recognition software and may include unintentional dictation errors.

## 2017-07-16 NOTE — ED Triage Notes (Addendum)
Pt to ED from home reporting new onset of chest pain at 1500 this afternoon while at the grocery store. CP radiates down her left arm with nausea and gaging with EMS. Pt has a halter monitor on for undiagnosed Afib and sustained Vfib per pt. PT denies SOB. Headache also reported while at home.   Pt took 1 Nitro and 324 ASA at home and was given a second Nitro with EMS that reportedly dropped the pain from 7/10 to 4/10. 4mg  of Zofran also given.   Hx of CHF with increased lower ext. Edema this week.

## 2017-07-16 NOTE — ED Provider Notes (Signed)
[facility] Emergency Department Provider Note  ____________________________________________  Time seen: Approximately 7:43 PM  I have reviewed the triage vital signs and the nursing notes.   HISTORY  Chief Complaint Chest Pain    HPI [name] is a 72 y.o. female with a history of diabetes, hypertension, CHF, and angina who complains of achy central chest pain that started suddenly at 3 PM today. It is been constant since then, radiating down the left arm associated with nausea vomiting and shortness of breath. She also had an episode of diaphoresis at onset. Worse with exertion, unremitting throughout the day. She called EMS after taking 324 mg of aspirin at home. Initially the pain was 7/10. After taking a nitroglycerin herself and another one from EMS, pain decreased to 4/10. It is now 6/10.      Past Medical History:  Diagnosis Date  . Anginal pain ([place])   . Anxiety   . Arthritis   . CHF (congestive heart failure) ([place])   . Chronic back pain    stenosis.degenerative disc,some scoliosis  . Constipation    takes Stool Softener daily  . Coronary artery disease   . Depression    takes Cymbalta daily  . Diabetes mellitus    Type 2 diabetic. Average fasting blood sugar runs high 170-200  . Diverticulosis   . E coli infection   . GERD (gastroesophageal reflux disease)    takes Nexium daily  . Headache   . Headache 02/16/2016  . Hemorrhoids   . History of colon polyps    benign  . History of gout    doesn't take any meds  . History of hiatal hernia   . History of vertigo    doesn't take any meds  . Hyperlipidemia    takes Praluent daily  . Hypertension    currently BP medications are on hold   . Hypothyroidism    takes Synthroid daily  . Insomnia    takes Restoril nightly  . Joint pain   . Joint swelling   . Muscle spasm    takes Robaxin as needed  . NSVT (nonsustained ventricular tachycardia) ([place])   . OSA on CPAP   . Periodic  heart flutter   . Peripheral edema    takes LAsix as needed  . Peripheral vascular disease ([place])    AAA as stated per pt / was just discovered and pt states has not been referred to vascular MD   . Restless leg    takes Requip daily  . Rosacea   . Sleep apnea   . Urinary incontinence   . Urinary urgency   . Varicose veins   . Weakness    numbness and tingling.     Patient Active Problem List   Diagnosis Date Noted  . Lymphedema 12/18/2016  . Chronic diastolic heart failure ([place]) 08/10/2016  . Hypertensive heart disease 07/25/2016  . Morbid obesity ([place]) 07/25/2016  . Physical deconditioning 07/25/2016  . Pyuria 06/25/2016  . Abdominal pain 06/24/2016  . Abscess of back   . Lumbar degenerative disc disease 11/14/2015  . Anxiety 02/24/2014  . Chest pain 06/09/2012  . CAD (coronary artery disease), native coronary artery 06/09/2012  . Hyperlipidemia 08/09/2011  . Arm pain 06/28/2011  . Diabetes mellitus ([place]) 05/05/2010  . OSA on CPAP 05/05/2010  . HTN (hypertension) 05/05/2010  . Edema 05/05/2010  . HYPERTRIGLYCERIDEMIA 06/08/2008     Past Surgical History:  Procedure Laterality Date  . ABDOMINAL HYSTERECTOMY  with BSo  . CARDIAC CATHETERIZATION  2013   Normal  . CARPAL TUNNEL RELEASE Bilateral   . CATARACT EXTRACTION, BILATERAL    . CHOLECYSTECTOMY    . COLONOSCOPY    . DIAGNOSTIC LAPAROSCOPY     multiple times  . DILATION AND CURETTAGE OF UTERUS    . ESOPHAGOGASTRODUODENOSCOPY (EGD) WITH PROPOFOL N/A 11/01/2014   Procedure: ESOPHAGOGASTRODUODENOSCOPY (EGD) WITH PROPOFOL;  Surgeon: Hulen Luster, MD;  Location: [facility] ENDOSCOPY;  Service: Gastroenterology;  Laterality: N/A;  . HARDWARE REMOVAL Left 10/11/2016   Procedure: HARDWARE REMOVAL-LEFT RADIUS;  Surgeon: Lovell Sheehan, MD;  Location: [facility] ORS;  Service: Orthopedics;  Laterality: Left;  Left Radius Wrist   . IMPLANTABLE CONTACT LENS IMPLANTATION     bilateral  . LAMINECTOMY  11/13/2015  . LUMBAR FUSION   11/2015  . LUMBAR WOUND DEBRIDEMENT N/A 12/02/2015   Procedure: WOUND Exploration;  Surgeon: Consuella Lose, MD;  Location: [place];  Service: Neurosurgery;  Laterality: N/A;  . OPEN REDUCTION INTERNAL FIXATION (ORIF) DISTAL RADIAL FRACTURE Left 08/29/2016   Procedure: OPEN REDUCTION INTERNAL FIXATION (ORIF) DISTAL RADIAL FRACTURE;  Surgeon: Lovell Sheehan, MD;  Location: [facility] ORS;  Service: Orthopedics;  Laterality: Left;  . PICC LINE PLACE PERIPHERAL ([place] HX)     right upper arm   . ROTATOR CUFF REPAIR Left   . SAVORY DILATION N/A 11/01/2014   Procedure: SAVORY DILATION;  Surgeon: Hulen Luster, MD;  Location: [facility] ENDOSCOPY;  Service: Gastroenterology;  Laterality: N/A;  . TONSILLECTOMY    . TRIGGER FINGER RELEASE Bilateral      Prior to Admission medications   Medication Sig Start Date End Date Taking? Authorizing Provider  albuterol (PROVENTIL HFA;VENTOLIN HFA) 108 (90 Base) MCG/ACT inhaler Inhale 2 puffs into the lungs every 6 (six) hours as needed for wheezing or shortness of breath. 01/09/17  Yes Hackney, Tina A, FNP  Alirocumab (PRALUENT) 150 MG/ML SOPN Inject 1 pen into the skin every 14 (fourteen) days. Patient taking differently: Inject 150 mg into the skin every 14 (fourteen) days.  06/13/17  Yes Minna Merritts, MD  aspirin 81 MG tablet Take 81 mg by mouth at bedtime.    Yes [provider]  Azelastine HCl (ASTEPRO) 0.15 % SOLN Place 2 sprays into both nostrils at bedtime.    Yes [provider]  Cholecalciferol (VITAMIN D) 2000 units tablet Take 2,000 Units by mouth daily.   Yes [provider]  desonide (DESOWEN) 0.05 % lotion Apply 1 application topically daily.   Yes [provider]  esomeprazole (NEXIUM) 40 MG capsule Take 40 mg by mouth 2 (two) times daily before a meal.    Yes [provider]  etodolac (LODINE) 400 MG tablet Take 400 mg by mouth 2 (two) times daily. 06/08/17  Yes [provider]  ezetimibe (ZETIA) 10 MG  tablet Take 1 tablet (10 mg total) by mouth daily. 06/13/17  Yes Gollan, Kathlene November, MD  gabapentin (NEURONTIN) 300 MG capsule Take 300 mg by mouth 3 (three) times daily.    Yes [provider]  hyoscyamine (LEVSIN SL) 0.125 MG SL tablet Place 2 tablets (0.25 mg total) under the tongue every 4 (four) hours as needed for cramping. 06/25/16  Yes Theodoro Grist, MD  insulin aspart (NOVOLOG) 100 UNIT/ML FlexPen Inject 10-16 Units into the skin See admin instructions. 10 units with breakfast, 10 units with lunch, 16 units at dinner, >200 increase by 2 units. 03/01/15 07/23/17 Yes [provider]  insulin  glargine (LANTUS) 100 UNIT/ML injection Inject 32 Units into the skin daily.    Yes [provider]  ipratropium-albuterol (DUONEB) 0.5-2.5 (3) MG/3ML SOLN Take 3 mLs by nebulization every 6 (six) hours. Patient taking differently: Take 3 mLs by nebulization every 6 (six) hours as needed.  10/14/16  Yes Vaughan Basta, MD  levothyroxine (SYNTHROID, LEVOTHROID) 75 MCG tablet Take 75 mcg by mouth daily before breakfast.  11/24/12  Yes [provider]  lisinopril (PRINIVIL,ZESTRIL) 5 MG tablet Take 1 tablet (5 mg total) by mouth daily. 06/13/17  Yes Gollan, Kathlene November, MD  metoprolol succinate (TOPROL-XL) 25 MG 24 hr tablet Take 1 tablet (25 mg total) by mouth 2 (two) times daily. 06/13/17  Yes Gollan, Kathlene November, MD  nitroGLYCERIN (NITROSTAT) 0.4 MG SL tablet Place 1 tablet (0.4 mg total) under the tongue every 5 (five) minutes as needed for chest pain. 03/06/16 09/18/17 Yes Gollan, Kathlene November, MD  potassium chloride SA (K-DUR,KLOR-CON) 20 MEQ tablet Take 1 tablet (20 mEq total) by mouth daily. 06/13/17  Yes Minna Merritts, MD  ranolazine (RANEXA) 1000 MG SR tablet Take 1 tablet (1,000 mg total) by mouth 2 (two) times daily. 06/13/17  Yes Gollan, Kathlene November, MD  ropinirole (REQUIP) 5 MG tablet Take 5 mg by mouth 2 (two) times daily. Takes in afternoon and evening   Yes [provider]  temazepam (RESTORIL) 30 MG capsule Take 30 mg by mouth at bedtime.   Yes [provider]  torsemide (DEMADEX) 20 MG tablet Take 1 tablet (20 mg total) by mouth daily. 06/13/17  Yes Gollan, Kathlene November, MD  traMADol (ULTRAM) 50 MG tablet Take 50 mg by mouth every 6 (six) hours as needed.   Yes [provider]     Allergies Ceftin [cefuroxime axetil]; Codeine; Penicillins; and Vicodin [hydrocodone-acetaminophen]   Family History  Problem Relation Age of Onset  . Heart disease Mother   . Breast cancer Mother 23  . Heart disease Father   . Heart attack Sister   . Heart disease Sister   . Heart disease Brother     Social History Social History   Tobacco Use  . Smoking status: Never Smoker  . Smokeless tobacco: Never Used  Substance Use Topics  . Alcohol use: No  . Drug use: No    Review of Systems  Constitutional:   No fever or chills.   Cardiovascular:   positive as above chest pain without syncope. Respiratory:   positive shortness of breath without cough. Gastrointestinal:   Negative for abdominal pain, vomiting and diarrhea.  Musculoskeletal:   Negative for focal pain or swelling All other systems reviewed and are negative except as documented above in ROS and HPI.  ____________________________________________   PHYSICAL EXAM:  VITAL SIGNS: ED Triage Vitals  Enc Vitals Group     BP 07/16/17 1851 136/71     Pulse Rate 07/16/17 1851 99     Resp 07/16/17 1851 (!) 22     Temp 07/16/17 1851 98 F (36.7 C)     Temp Source 07/16/17 1851 Oral     SpO2 07/16/17 1851 96 %     Weight 07/16/17 1855 229 lb (103.9 kg)     Height 07/16/17 1855 5\' 1"  (1.549 m)     Head Circumference --      Peak Flow --      Pain Score 07/16/17 1902 4     Pain Loc --      Pain Edu? --  Excl. in [place]? --     Vital signs reviewed, nursing assessments reviewed.   Constitutional:   Alert and oriented. ill appearing. Eyes:   Conjunctivae are normal.  EOMI. PERRL. ENT      Head:   Normocephalic and atraumatic.      Nose:   No congestion/rhinnorhea.       Mouth/Throat:   MMM, no pharyngeal erythema. No peritonsillar mass.       Neck:   No meningismus. Full ROM. Hematological/Lymphatic/Immunilogical:   No cervical lymphadenopathy. Cardiovascular:   RRR. Symmetric bilateral radial and DP pulses.  No murmurs.  Respiratory:   Normal respiratory effort without tachypnea/retractions. Breath sounds are clear and equal bilaterally. No wheezes/rales/rhonchi. Gastrointestinal:   Soft and nontender. Non distended. There is no CVA tenderness.  No rebound, rigidity, or guarding.  Musculoskeletal:   Normal range of motion in all extremities. No joint effusions.  No lower extremity tenderness.  No edema. Neurologic:   Normal speech and language.  Motor grossly intact. No acute focal neurologic deficits are appreciated.  Skin:    Skin is warm, dry and intact. No rash noted.  No petechiae, purpura, or bullae.  ____________________________________________    LABS (pertinent positives/negatives) (all labs ordered are listed, but only abnormal results are displayed) Labs Reviewed  BASIC METABOLIC PANEL - Abnormal; Notable for the following components:      Result Value   Glucose, Bld 185 (*)    Creatinine, Ser 1.29 (*)    Calcium 8.8 (*)    GFR calc non Af Amer 40 (*)    GFR calc Af Amer 47 (*)    All other components within normal limits  CBC WITH DIFFERENTIAL/PLATELET - Abnormal; Notable for the following components:   RDW 14.6 (*)    All other components within normal limits  TROPONIN I  APTT  HEPARIN LEVEL (UNFRACTIONATED)  PROTIME-INR   ____________________________________________   EKG  interpreted by me Sinus tachycardia rate 100, normal axis intervals QRS ST segments and T waves.  ____________________________________________    VFIEPPIRJ  Dg Chest Portable 1 View  Result Date: 07/16/2017 CLINICAL DATA:  Acute onset of  central chest pain radiating to left arm approximately 3 hours ago. Nausea. EXAM: PORTABLE CHEST 1 VIEW COMPARISON:  10/14/2016 FINDINGS: The heart size and mediastinal contours are within normal limits. Both lungs are clear. Holter monitor seen overlying the left hemithorax. The visualized skeletal structures are unremarkable. IMPRESSION: No active disease. Electronically Signed   By: Earle Gell M.D.   On: 07/16/2017 19:24    ____________________________________________   PROCEDURES .Critical Care Performed by: Carrie Mew, MD Authorized by: Carrie Mew, MD   Critical care provider statement:    Critical care time (minutes):  35   Critical care time was exclusive of:  Separately billable procedures and treating other patients   Critical care was necessary to treat or prevent imminent or life-threatening deterioration of the following conditions:  Cardiac failure   Critical care was time spent personally by me on the following activities:  Development of treatment plan with patient or surrogate, discussions with consultants, evaluation of patient's response to treatment, examination of patient, obtaining history from patient or surrogate, ordering and performing treatments and interventions, ordering and review of laboratory studies, ordering and review of radiographic studies, pulse oximetry, re-evaluation of patient's condition and review of old charts    ____________________________________________  DIFFERENTIAL DIAGNOSIS   non-STEMI, unstable angina, pneumothorax,  pneumonia. Doubt dissection or pulmonary embolism.  CLINICAL IMPRESSION / ASSESSMENT  AND PLAN / ED COURSE  Pertinent labs & imaging results that were available during my care of the patient were reviewed by me and considered in my medical decision making (see chart for details).      Clinical Course as of Jul 17 2151  Tue Jul 16, 2017  2010 patient presents with central chest pain concerning for  non-STEMI. EKG is nondiagnostic. Check labs, give nitroglycerin. If unable to resolve pain will need to proceed with heparin, nitroglycerin infusion and opioids.patient's symptoms and risk profile warrant hospitalization for telemetry monitoring and further cardiac workup.   [PS]  2014 troponin negative. Persistent chest pain despite 3 sublingual nitroglycerin. I will  started a nitroglycerin infusion, heparin drip, morphine when necessary.   [PS]    Clinical Course User Index [PS] Carrie Mew, MD     ____________________________________________   FINAL CLINICAL IMPRESSION(S) / ED DIAGNOSES    Final diagnoses:  Unstable angina ([place])  precordial chest pain   ED Discharge Orders    None      Portions of this note were generated with dragon dictation software. Dictation errors may occur despite best attempts at proofreading.    Carrie Mew, MD 07/16/17 2154

## 2017-07-17 ENCOUNTER — Observation Stay (HOSPITAL_BASED_OUTPATIENT_CLINIC_OR_DEPARTMENT_OTHER)
Admit: 2017-07-17 | Discharge: 2017-07-17 | Disposition: A | Discharge status: Home / Self Care | Payer: Medicare Other | Attending: Internal Medicine | Admitting: Internal Medicine

## 2017-07-17 ENCOUNTER — Encounter: Payer: Self-pay | Admitting: Radiology

## 2017-07-17 ENCOUNTER — Observation Stay (HOSPITAL_BASED_OUTPATIENT_CLINIC_OR_DEPARTMENT_OTHER): Payer: Medicare Other

## 2017-07-17 DIAGNOSIS — I10 Essential (primary) hypertension: Secondary | ICD-10-CM

## 2017-07-17 DIAGNOSIS — I5030 Unspecified diastolic (congestive) heart failure: Secondary | ICD-10-CM

## 2017-07-17 DIAGNOSIS — I25118 Atherosclerotic heart disease of native coronary artery with other forms of angina pectoris: Secondary | ICD-10-CM | POA: Diagnosis not present

## 2017-07-17 DIAGNOSIS — I5032 Chronic diastolic (congestive) heart failure: Secondary | ICD-10-CM | POA: Diagnosis not present

## 2017-07-17 DIAGNOSIS — F419 Anxiety disorder, unspecified: Secondary | ICD-10-CM

## 2017-07-17 DIAGNOSIS — G4733 Obstructive sleep apnea (adult) (pediatric): Secondary | ICD-10-CM

## 2017-07-17 DIAGNOSIS — R072 Precordial pain: Secondary | ICD-10-CM

## 2017-07-17 DIAGNOSIS — Z9989 Dependence on other enabling machines and devices: Secondary | ICD-10-CM

## 2017-07-17 DIAGNOSIS — E782 Mixed hyperlipidemia: Secondary | ICD-10-CM | POA: Diagnosis not present

## 2017-07-17 DIAGNOSIS — R0789 Other chest pain: Secondary | ICD-10-CM | POA: Diagnosis not present

## 2017-07-17 DIAGNOSIS — R079 Chest pain, unspecified: Secondary | ICD-10-CM | POA: Diagnosis not present

## 2017-07-17 HISTORY — DX: Unspecified diastolic (congestive) heart failure: I50.30

## 2017-07-17 LAB — NM MYOCAR MULTI W/SPECT W/WALL MOTION / EF
CHL CUP NUCLEAR SSS: 1
CSEPHR: 84 %
LV dias vol: 47 mL (ref 46–106)
LV sys vol: 9 mL
Peak HR: 125 {beats}/min
Rest HR: 96 {beats}/min
SDS: 1
SRS: 0
TID: 1

## 2017-07-17 LAB — CBC
HEMATOCRIT: 35.2 % (ref 35.0–47.0)
Hemoglobin: 12 g/dL (ref 12.0–16.0)
MCH: 31.3 pg (ref 26.0–34.0)
MCHC: 34.2 g/dL (ref 32.0–36.0)
MCV: 91.6 fL (ref 80.0–100.0)
Platelets: 252 10*3/uL (ref 150–440)
RBC: 3.84 MIL/uL (ref 3.80–5.20)
RDW: 15 % — ABNORMAL HIGH (ref 11.5–14.5)
WBC: 6.3 10*3/uL (ref 3.6–11.0)

## 2017-07-17 LAB — PROTIME-INR
INR: 1.01
Prothrombin Time: 13.2 seconds (ref 11.4–15.2)

## 2017-07-17 LAB — ECHOCARDIOGRAM COMPLETE
HEIGHTINCHES: 61 in
Weight: 3592 oz

## 2017-07-17 LAB — BASIC METABOLIC PANEL
ANION GAP: 8 (ref 5–15)
BUN: 17 mg/dL (ref 6–20)
CHLORIDE: 103 mmol/L (ref 101–111)
CO2: 29 mmol/L (ref 22–32)
Calcium: 8.7 mg/dL — ABNORMAL LOW (ref 8.9–10.3)
Creatinine, Ser: 1.18 mg/dL — ABNORMAL HIGH (ref 0.44–1.00)
GFR, EST AFRICAN AMERICAN: 52 mL/min — AB (ref >60)
GFR, EST NON AFRICAN AMERICAN: 45 mL/min — AB (ref >60)
Glucose, Bld: 130 mg/dL — ABNORMAL HIGH (ref 65–99)
POTASSIUM: 3.5 mmol/L (ref 3.5–5.1)
Sodium: 140 mmol/L (ref 135–145)

## 2017-07-17 LAB — GLUCOSE, CAPILLARY
GLUCOSE-CAPILLARY: 114 mg/dL — AB (ref 65–99)
Glucose-Capillary: 137 mg/dL — ABNORMAL HIGH (ref 65–99)

## 2017-07-17 LAB — APTT: aPTT: 110 seconds — ABNORMAL HIGH (ref 24–36)

## 2017-07-17 LAB — HEPARIN LEVEL (UNFRACTIONATED): HEPARIN UNFRACTIONATED: 0.82 [IU]/mL — AB (ref 0.30–0.70)

## 2017-07-17 LAB — TROPONIN I: Troponin I: <0.03 ng/mL (ref <0.03)

## 2017-07-17 LAB — BRAIN NATRIURETIC PEPTIDE: B Natriuretic Peptide: 29 pg/mL (ref 0.0–100.0)

## 2017-07-17 MED ORDER — ALBUTEROL SULFATE (2.5 MG/3ML) 0.083% IN NEBU
2.5000 mg | INHALATION_SOLUTION | Freq: Four times a day (QID) | RESPIRATORY_TRACT | Status: DC
  Administered 2017-07-17: 2.5 mg via RESPIRATORY_TRACT

## 2017-07-17 MED ORDER — AMINOPHYLLINE 25 MG/ML IV (NUC MED)
75.0000 mg | Freq: Once | INTRAVENOUS | Status: AC
  Administered 2017-07-17: 75 mg via INTRAVENOUS
  Filled 2017-07-17: qty 3

## 2017-07-17 MED ORDER — TORSEMIDE 20 MG PO TABS: 20.0000 mg | ORAL_TABLET | Freq: Every day | ORAL | 3 refills | Status: DC

## 2017-07-17 MED ORDER — POTASSIUM CHLORIDE CRYS ER 20 MEQ PO TBCR
20.0000 meq | EXTENDED_RELEASE_TABLET | Freq: Every day | ORAL | Status: DC
  Administered 2017-07-17: 20 meq via ORAL
  Filled 2017-07-17: qty 1

## 2017-07-17 MED ORDER — TECHNETIUM TC 99M TETROFOSMIN IV KIT
13.5320 | PACK | Freq: Once | INTRAVENOUS | Status: AC | PRN
  Administered 2017-07-17: 13.532 via INTRAVENOUS

## 2017-07-17 MED ORDER — REGADENOSON 0.4 MG/5ML IV SOLN
0.4000 mg | Freq: Once | INTRAVENOUS | Status: AC
  Administered 2017-07-17: 0.4 mg via INTRAVENOUS
  Filled 2017-07-17: qty 5

## 2017-07-17 MED ORDER — TECHNETIUM TC 99M TETROFOSMIN IV KIT
31.4960 | PACK | Freq: Once | INTRAVENOUS | Status: AC | PRN
  Administered 2017-07-17: 31.496 via INTRAVENOUS

## 2017-07-17 NOTE — Discharge Instructions (Signed)

## 2017-07-17 NOTE — Progress Notes (Signed)
*  PRELIMINARY RESULTS* Echocardiogram 2D Echocardiogram has been performed.  Wallie Char Dorathea Faerber 07/17/2017, 3:57 PM

## 2017-07-17 NOTE — Care Management Obs Status (Signed)
[place] NOTIFICATION   Patient Details  Name: LOVETTE MERTA MRN: [id number] Date of Birth: [birth year]   Medicare Observation Status Notification Given:  Yes    Bridgitt Raggio A Cleopha Indelicato, RN 07/17/2017, 11:02 AM

## 2017-07-17 NOTE — Progress Notes (Signed)
Home cpap  Inspected and function properly , , set and  Ready use , pt stated that  she doesn't need any help with cpap, will notify [facility]

## 2017-07-17 NOTE — Progress Notes (Signed)
[place] for heparin Indication: chest pain/ACS  Allergies  Allergen Reactions  . Ceftin [Cefuroxime Axetil] Diarrhea  . Codeine Rash       . Penicillins Rash    Has patient had a PCN reaction causing immediate rash, facial/tongue/throat swelling, SOB or lightheadedness with hypotension: NO Has patient had a PCN reaction causing severe rash involving mucus membranes or skin necrosis: NO Has patient had a PCN retioion that required hospitalization NO Has patient had a PCN reaction occurring within the last 10 years: NO If all of the above answers are "NO", then may proceed with Cephalosporin use.  . Vicodin [Hydrocodone-Acetaminophen] Other (See Comments)    passes out    Patient Measurements: Height: 5\' 1"  (154.9 cm) Weight: 224 lb 8 oz (101.8 kg) IBW/kg (Calculated) : 47.8 Heparin Dosing Weight: 73kg  Vital Signs: Temp: 97.5 F (36.4 C) (06/05 0359) Temp Source: Oral (06/05 0359) BP: 106/53 (06/05 0400) Pulse Rate: 86 (06/05 0400)  Labs: Recent Labs    07/16/17 1906 07/17/17 0008 07/17/17 0601  HGB 12.3  --  12.0  HCT 36.4  --  35.2  PLT 270  --  252  APTT  --  110*  --   LABPROT  --  13.2  --   INR  --  1.01  --   HEPARINUNFRC  --   --  0.82*  CREATININE 1.29*  --  1.18*  TROPONINI <0.03 <0.03 <0.03    Estimated Creatinine Clearance: 47.2 mL/min (A) (by C-G formula based on SCr of 1.18 mg/dL (H)).   Medical History: Past Medical History:  Diagnosis Date  . Anginal pain ([place])   . Anxiety   . Arthritis   . CHF (congestive heart failure) ([place])   . Chronic back pain    stenosis.degenerative disc,some scoliosis  . Constipation    takes Stool Softener daily  . Coronary artery disease   . Depression    takes Cymbalta daily  . Diabetes mellitus    Type 2 diabetic. Average fasting blood sugar runs high 170-200  . Diverticulosis   . E coli infection   . GERD (gastroesophageal reflux disease)    takes Nexium daily   . Headache   . Headache 02/16/2016  . Hemorrhoids   . History of colon polyps    benign  . History of gout    doesn't take any meds  . History of hiatal hernia   . History of vertigo    doesn't take any meds  . Hyperlipidemia    takes Praluent daily  . Hypertension    currently BP medications are on hold   . Hypothyroidism    takes Synthroid daily  . Insomnia    takes Restoril nightly  . Joint pain   . Joint swelling   . Muscle spasm    takes Robaxin as needed  . NSVT (nonsustained ventricular tachycardia) ([place])   . OSA on CPAP   . Periodic heart flutter   . Peripheral edema    takes LAsix as needed  . Peripheral vascular disease ([place])    AAA as stated per pt / was just discovered and pt states has not been referred to vascular MD   . Restless leg    takes Requip daily  . Rosacea   . Sleep apnea   . Urinary incontinence   . Urinary urgency   . Varicose veins   . Weakness    numbness and tingling.    Assessment:  72 year old female with PMH of angina, CHF, CAD, DM presents with chest pain, troponin pending. She is on no anticoagulants PTA per med rec.  Goal of Therapy:  Heparin level 0.3-0.7 units/ml Monitor platelets by anticoagulation protocol: Yes   Plan:  Give 4000 units bolus x 1 Start heparin infusion at 1000 units/hr  Will order a STAT aPTT, PT/INR and a heparin level 8 hours after heparin therapy begins  6/5 0600 HL supratherapeutic at 0.82. Will adjust heparin infusion to heparin 850u/hr. Will recheck HL in 8 hours.   Pernell Dupre, PharmD, BCPS Clinical Pharmacist 07/17/2017 7:19 AM

## 2017-07-17 NOTE — Progress Notes (Signed)
    Lexiscan was performed this afternoon. 3 minutes into the test the patient developed significant nausea with emesis and associated bradycardia into the 50s bpm with brief syncopal episode. Patient never lost a pulse. BP stable. Aminophylline 75 mg was given IV x 1 with improvement in wheezing, nausea, and vomiting. At test conclusion, patient was given an albuterol neb. Post-test BP 129/61.

## 2017-07-17 NOTE — Plan of Care (Signed)
  Problem: Health Behavior/Discharge Planning: Goal: Ability to manage health-related needs will improve Outcome: Progressing Note:  Patient agreeable to undergo a cardiac stress test this AM. Consent form already signed and filed, and procedure already discussed with the cardiologist Dr. Rockey Situ. Will continue to monitor. [name]

## 2017-07-17 NOTE — Care Management Note (Signed)
Case Management Note  Patient Details  Name: [name] MRN: [id number] Date of Birth: 04-07-1945  Subjective/Objective:       Patient admitted to [facility] under observation status for chest pain. RNCM assessed patient and provided moon letter. Patient currently lives at home with her husband who is at the bedside [phone]. Patient currently uses walker and a wheelchair as needed for her DME. Has previously been set up in the past with [facility] for HHPT r/t her back surgery. No transportation issues. Uses [facility] and is able to obtain her medications without concern. PCP is Dr Sabra Heck with whom she sees regularly.             Action/Plan: Currently on acute O2. Will talk with primary nurse to see if there are needs close to discharge. RNCM will continue to follow  Expected Discharge Date:                  Expected Discharge Plan:     In-House Referral:     Discharge planning Services     Post Acute Care Choice:    Choice offered to:     DME Arranged:    DME Agency:     HH Arranged:    HH Agency:     Status of Service:     If discussed at [employer] of Stay Meetings, dates discussed:    Additional Comments:  Audri Kozub A Labib Cwynar, RN 07/17/2017, 11:07 AM

## 2017-07-17 NOTE — Discharge Summary (Signed)
[facility] NAME: [name]    MR#:  [id number]  DATE OF BIRTH:  [birth year]  DATE OF ADMISSION:  07/16/2017   ADMITTING PHYSICIAN: Lance Coon, MD  DATE OF DISCHARGE: 07/17/17  PRIMARY CARE PHYSICIAN: Minna Merritts, MD   ADMISSION DIAGNOSIS:   Unstable angina ([place]) [I20.0]  DISCHARGE DIAGNOSIS:   Principal Problem:   Chest pain Active Problems:   Diabetes mellitus ([place])   OSA on CPAP   HTN (hypertension)   Hyperlipidemia   CAD (coronary artery disease), native coronary artery   Anxiety   Chronic diastolic heart failure ([place])   SECONDARY DIAGNOSIS:   Past Medical History:  Diagnosis Date  . Anginal pain ([place])   . Anxiety   . Arthritis   . CHF (congestive heart failure) ([place])   . Chronic back pain    stenosis.degenerative disc,some scoliosis  . Constipation    takes Stool Softener daily  . Coronary artery disease   . Depression    takes Cymbalta daily  . Diabetes mellitus    Type 2 diabetic. Average fasting blood sugar runs high 170-200  . Diastolic CHF ([place]) 44/96/7591   Per patient, diagnosed in 2018  . Diverticulosis   . E coli infection   . GERD (gastroesophageal reflux disease)    takes Nexium daily  . Headache   . Headache 02/16/2016  . Hemorrhoids   . History of colon polyps    benign  . History of gout    doesn't take any meds  . History of hiatal hernia   . History of vertigo    doesn't take any meds  . Hyperlipidemia    takes Praluent daily  . Hypertension    currently BP medications are on hold   . Hypothyroidism    takes Synthroid daily  . Insomnia    takes Restoril nightly  . Joint pain   . Joint swelling   . Muscle spasm    takes Robaxin as needed  . NSVT (nonsustained ventricular tachycardia) ([place])   . OSA on CPAP   . Periodic heart flutter   . Peripheral edema    takes LAsix as needed  . Peripheral vascular disease ([place])    AAA as stated per pt / was just  discovered and pt states has not been referred to vascular MD   . Restless leg    takes Requip daily  . Rosacea   . Sleep apnea   . Urinary incontinence   . Urinary urgency   . Varicose veins   . Weakness    numbness and tingling.    HOSPITAL COURSE:   72 year old female with past medical history significant for CAD, diastolic CHF, hypertension, diabetes, hyperlipidemia, obesity, sleep apnea on CPAP, chronic back pain and hip pain following with the physiatrist presents to hospital secondary to chest pain  1.  Chest pain-atypical chest pain.  Could be musculoskeletal. -Appreciate cardiology consult.  Troponins were negative -Stress test showing low risk, no acute ischemia. -No further chest pain at this time. -She was started on nitro drip and heparin drip initially which have been discontinued once MI was ruled out -Continue Ranexa for chronic chest pains  2.  Chronic diastolic CHF-chest x-ray with no acute exacerbation and BNP is not elevated -Continue torsemide at home and advised to take an extra dose in the afternoon-fluid weight gain noted.  Patient follows up with CHF clinic  3.  Failure to thrive/weakness from  chronic back pain-continue to follow-up with primary care physician and physiatrist.  Recently received epidural injections. -Offered home health physical therapy, patient is not interested at this time  4.  Palpitations-no significant arrhythmia noted.  Continue Toprol at home  5.  Hyperlipidemia-statin intolerant.  On Praluent  6.  Diabetes mellitus-continue home doses of insulin.  Will be discharged today  DISCHARGE CONDITIONS:   Guarded  CONSULTS OBTAINED:   Treatment Team:  Minna Merritts, MD  DRUG ALLERGIES:   Allergies  Allergen Reactions  . Ceftin [Cefuroxime Axetil] Diarrhea  . Codeine Rash       . Penicillins Rash    Has patient had a PCN reaction causing immediate rash, facial/tongue/throat swelling, SOB or lightheadedness with  hypotension: NO Has patient had a PCN reaction causing severe rash involving mucus membranes or skin necrosis: NO Has patient had a PCN retioion that required hospitalization NO Has patient had a PCN reaction occurring within the last 10 years: NO If all of the above answers are "NO", then may proceed with Cephalosporin use.  . Vicodin [Hydrocodone-Acetaminophen] Other (See Comments)    passes out   DISCHARGE MEDICATIONS:   Allergies as of 07/17/2017      Reactions   Ceftin [cefuroxime Axetil] Diarrhea   Codeine Rash       Penicillins Rash   Has patient had a PCN reaction causing immediate rash, facial/tongue/throat swelling, SOB or lightheadedness with hypotension: NO Has patient had a PCN reaction causing severe rash involving mucus membranes or skin necrosis: NO Has patient had a PCN retioion that required hospitalization NO Has patient had a PCN reaction occurring within the last 10 years: NO If all of the above answers are "NO", then may proceed with Cephalosporin use.   Vicodin [hydrocodone-acetaminophen] Other (See Comments)   passes out      Medication List    TAKE these medications   albuterol 108 (90 Base) MCG/ACT inhaler Commonly known as:  PROVENTIL HFA;VENTOLIN HFA Inhale 2 puffs into the lungs every 6 (six) hours as needed for wheezing or shortness of breath.   Alirocumab 150 MG/ML Sopn Commonly known as:  PRALUENT Inject 1 pen into the skin every 14 (fourteen) days. What changed:  how much to take   aspirin 81 MG tablet Take 81 mg by mouth at bedtime.   ASTEPRO 0.15 % Soln Generic drug:  Azelastine HCl Place 2 sprays into both nostrils at bedtime.   desonide 0.05 % lotion Commonly known as:  DESOWEN Apply 1 application topically daily.   esomeprazole 40 MG capsule Commonly known as:  NEXIUM Take 40 mg by mouth 2 (two) times daily before a meal.   etodolac 400 MG tablet Commonly known as:  LODINE Take 400 mg by mouth 2 (two) times daily.   ezetimibe  10 MG tablet Commonly known as:  ZETIA Take 1 tablet (10 mg total) by mouth daily.   gabapentin 300 MG capsule Commonly known as:  NEURONTIN Take 300 mg by mouth 3 (three) times daily.   hyoscyamine 0.125 MG SL tablet Commonly known as:  LEVSIN SL Place 2 tablets (0.25 mg total) under the tongue every 4 (four) hours as needed for cramping.   insulin aspart 100 UNIT/ML FlexPen Commonly known as:  NOVOLOG Inject 10-16 Units into the skin See admin instructions. 10 units with breakfast, 10 units with lunch, 16 units at dinner, >200 increase by 2 units.   insulin glargine 100 UNIT/ML injection Commonly known as:  LANTUS Inject 32 Units  into the skin daily.   ipratropium-albuterol 0.5-2.5 (3) MG/3ML Soln Commonly known as:  DUONEB Take 3 mLs by nebulization every 6 (six) hours. What changed:    when to take this  reasons to take this   levothyroxine 75 MCG tablet Commonly known as:  SYNTHROID, LEVOTHROID Take 75 mcg by mouth daily before breakfast.   lisinopril 5 MG tablet Commonly known as:  PRINIVIL,ZESTRIL Take 1 tablet (5 mg total) by mouth daily.   metoprolol succinate 25 MG 24 hr tablet Commonly known as:  TOPROL-XL Take 1 tablet (25 mg total) by mouth 2 (two) times daily.   nitroGLYCERIN 0.4 MG SL tablet Commonly known as:  NITROSTAT Place 1 tablet (0.4 mg total) under the tongue every 5 (five) minutes as needed for chest pain.   potassium chloride SA 20 MEQ tablet Commonly known as:  K-DUR,KLOR-CON Take 1 tablet (20 mEq total) by mouth daily.   ranolazine 1000 MG SR tablet Commonly known as:  RANEXA Take 1 tablet (1,000 mg total) by mouth 2 (two) times daily.   ropinirole 5 MG tablet Commonly known as:  REQUIP Take 5 mg by mouth 2 (two) times daily. Takes in afternoon and evening   temazepam 30 MG capsule Commonly known as:  RESTORIL Take 30 mg by mouth at bedtime.   torsemide 20 MG tablet Commonly known as:  DEMADEX Take 1 tablet (20 mg total) by  mouth daily. Take an extra 20mg  in the afternoon if weight gain or increased fluid retention noted. What changed:  additional instructions   traMADol 50 MG tablet Commonly known as:  ULTRAM Take 50 mg by mouth every 6 (six) hours as needed.   Vitamin D 2000 units tablet Take 2,000 Units by mouth daily.        DISCHARGE INSTRUCTIONS:   1.  PCP follow-up in 1 to 2 weeks 2.  Cardiology follow-up in 2 weeks  DIET:   Cardiac diet and Diabetic diet  ACTIVITY:   Activity as tolerated  OXYGEN:   Home Oxygen: No.  Oxygen Delivery: room air  DISCHARGE LOCATION:   home   If you experience worsening of your admission symptoms, develop shortness of breath, life threatening emergency, suicidal or homicidal thoughts you must seek medical attention immediately by calling 911 or calling your MD immediately  if symptoms less severe.  You Must read complete instructions/literature along with all the possible adverse reactions/side effects for all the Medicines you take and that have been prescribed to you. Take any new Medicines after you have completely understood and accpet all the possible adverse reactions/side effects.   Please note  You were cared for by a hospitalist during your hospital stay. If you have any questions about your discharge medications or the care you received while you were in the hospital after you are discharged, you can call the unit and asked to speak with the hospitalist on call if the hospitalist that took care of you is not available. Once you are discharged, your primary care physician will handle any further medical issues. Please note that NO REFILLS for any discharge medications will be authorized once you are discharged, as it is imperative that you return to your primary care physician (or establish a relationship with a primary care physician if you do not have one) for your aftercare needs so that they can reassess your need for medications and monitor  your lab values.    On the day of Discharge:  VITAL SIGNS:   Blood  pressure 134/75, pulse 92, temperature 97.9 F (36.6 C), temperature source Oral, resp. rate 16, height 5\' 1"  (1.549 m), weight 101.8 kg (224 lb 8 oz), SpO2 98 %.  PHYSICAL EXAMINATION:    GENERAL:  72 y.o.-year-old obese patient lying in the bed with no acute distress.  EYES: Pupils equal, round, reactive to light and accommodation. No scleral icterus. Extraocular muscles intact.  HEENT: Head atraumatic, normocephalic. Oropharynx and nasopharynx clear.  NECK:  Supple, no jugular venous distention. No thyroid enlargement, no tenderness.  LUNGS: Normal breath sounds bilaterally, no wheezing, rales,rhonchi or crepitation. No use of accessory muscles of respiration.  Decreased bibasilar breath sounds CARDIOVASCULAR: S1, S2 normal. No murmurs, rubs, or gallops.  ABDOMEN: Soft, non-tender, non-distended. Bowel sounds present. No organomegaly or mass.  EXTREMITIES: No pedal edema, cyanosis, or clubbing.  NEUROLOGIC: Cranial nerves II through XII are intact. Muscle strength 5/5 in all extremities. Sensation intact. Gait not checked.  PSYCHIATRIC: The patient is alert and oriented x 3.  SKIN: No obvious rash, lesion, or ulcer.   DATA REVIEW:   CBC Recent Labs  Lab 07/17/17 0601  WBC 6.3  HGB 12.0  HCT 35.2  PLT 252    Chemistries  Recent Labs  Lab 07/17/17 0601  NA 140  K 3.5  CL 103  CO2 29  GLUCOSE 130*  BUN 17  CREATININE 1.18*  CALCIUM 8.7*     Microbiology Results  Results for orders placed or performed during the hospital encounter of 10/09/16  Surgical pcr screen     Status: None   Collection Time: 10/09/16 12:38 PM  Result Value Ref Range Status   MRSA, PCR NEGATIVE NEGATIVE Final   Staphylococcus aureus NEGATIVE NEGATIVE Final    Comment: (NOTE) The Xpert SA Assay (FDA approved for NASAL specimens in patients 77 years of age and older), is one component of a comprehensive surveillance  program. It is not intended to diagnose infection nor to guide or monitor treatment.     RADIOLOGY:  Dg Chest Portable 1 View  Result Date: 07/16/2017 CLINICAL DATA:  Acute onset of central chest pain radiating to left arm approximately 3 hours ago. Nausea. EXAM: PORTABLE CHEST 1 VIEW COMPARISON:  10/14/2016 FINDINGS: The heart size and mediastinal contours are within normal limits. Both lungs are clear. Holter monitor seen overlying the left hemithorax. The visualized skeletal structures are unremarkable. IMPRESSION: No active disease. Electronically Signed   By: Earle Gell M.D.   On: 07/16/2017 19:24     Management plans discussed with the patient, family and they are in agreement.  CODE STATUS:     Code Status Orders  (From admission, onward)        Start     Ordered   07/16/17 2231  Full code  Continuous     07/16/17 2230    Code Status History    Date Active Date Inactive Code Status Order ID Comments User Context   10/11/2016 1255 10/14/2016 1535 Full Code [id number]  Fritzi Mandes, MD Inpatient   10/11/2016 1255 10/11/2016 1255 Full Code [id number]  Lovell Sheehan, MD Inpatient   08/29/2016 1629 08/29/2016 2107 Full Code [id number]  Lovell Sheehan, MD Inpatient   07/23/2016 2025 07/28/2016 1757 Full Code [id number]  Hillary Bow, MD ED   06/24/2016 0710 06/25/2016 1917 Full Code [id number]  Saundra Shelling, MD Inpatient   11/14/2015 1833 11/18/2015 1851 Full Code [id number]  Newman Pies, MD Inpatient   09/12/2015 1048 09/13/2015 0315 Full Code [id number]  Jeralyn Ruths,  Zenia Resides, MD [facility]    Advance Directive Documentation     Most Recent Value  Type of Advance Directive  Healthcare Power of Attorney, Living will  Pre-existing out of facility DNR order (yellow form or pink MOST form)  -  "MOST" Form in Place?  -      TOTAL TIME TAKING CARE OF THIS PATIENT: 38 minutes.    Gladstone Lighter M.D on 07/17/2017 at 5:02 PM  Between 7am to 6pm - Pager - [phone]  After 6pm go to [email or web address]  - Proofreader  [employer] [facility]  Office  [phone]  CC: Primary care physician; Minna Merritts, MD   Note: This dictation was prepared with Dragon dictation along with smaller phrase technology. Any transcriptional errors that result from this process are unintentional.

## 2017-07-17 NOTE — Consult Note (Signed)
Cardiology Consultation:   Patient ID: KRISSY OREBAUGH; [id number]; 09-23-1945   Admit date: 07/16/2017 Date of Consult: 07/17/2017  Primary Care Provider: Sabra Heck Primary Cardiologist: Rockey Situ   Patient Profile:   [name] is a 72 y.o. female with a hx of nonobstructive CAD by [facility] in 2013 as detailed below, chronic diastolic CHF, DM2, HTN, HLD with statin intolerance on PCSK-9 inhibitor, palpitations, obesity, OSA on CPAP, chronic back and hip pain who is being seen today for the evaluation of chest pain at the request of Dr. Jannifer Franklin.  History of Present Illness:   [name] underwent [place] in 2013 that showed a left dominant coronary system with moderate mid LAD, proximal diagonal #1 and proximal RCA disease all estimated at 50%, normal LV systolic function. Most recent TTE from 07/2016 showed an EF of 60-65%, no RWMA, Gr1DD, RVSF normal, unable to estimate PASP. Prior lower extremity edema improved with outpatient diuresis with torsemide 40 mg bid that was recently decreased to 20 mg daily at her follow up in 06/13/2017. She noted some palpitations at that time with heart rate into the 140s bpm and outpatient cardiac monitoring was advised which is currently pending at this time. In this setting, she was also concerned with bradycardic rates into the 40s bpm noted on her Apple Watch while sleeping. She was asymptomatic with these.   Patient was out grocery shopping on 6/4 when she developed chest pain with associated SOB, nausea, vomiting, and palpitations. There was also considerable fatigue. Her symptoms did not feel similar to when she has previously undergone LHC. Symptoms lasted until she arrived at [facility] and was placed on a nitro gtt. She has been been pain free since. She has noted a brief run palpitations this morning lasting a few seconds with telemetry being unrevealing. She has ruled out. EKG as below. CXR not acute. Vitals stable TTE pending. BNP pending. She is scheduled for a  Myoview this morning.    Past Medical History:  Diagnosis Date  . Anginal pain ([place])   . Anxiety   . Arthritis   . CHF (congestive heart failure) ([place])   . Chronic back pain    stenosis.degenerative disc,some scoliosis  . Constipation    takes Stool Softener daily  . Coronary artery disease   . Depression    takes Cymbalta daily  . Diabetes mellitus    Type 2 diabetic. Average fasting blood sugar runs high 170-200  . Diverticulosis   . E coli infection   . GERD (gastroesophageal reflux disease)    takes Nexium daily  . Headache   . Headache 02/16/2016  . Hemorrhoids   . History of colon polyps    benign  . History of gout    doesn't take any meds  . History of hiatal hernia   . History of vertigo    doesn't take any meds  . Hyperlipidemia    takes Praluent daily  . Hypertension    currently BP medications are on hold   . Hypothyroidism    takes Synthroid daily  . Insomnia    takes Restoril nightly  . Joint pain   . Joint swelling   . Muscle spasm    takes Robaxin as needed  . NSVT (nonsustained ventricular tachycardia) ([place])   . OSA on CPAP   . Periodic heart flutter   . Peripheral edema    takes LAsix as needed  . Peripheral vascular disease ([place])    AAA as stated  per pt / was just discovered and pt states has not been referred to vascular MD   . Restless leg    takes Requip daily  . Rosacea   . Sleep apnea   . Urinary incontinence   . Urinary urgency   . Varicose veins   . Weakness    numbness and tingling.    Past Surgical History:  Procedure Laterality Date  . ABDOMINAL HYSTERECTOMY     with BSo  . CARDIAC CATHETERIZATION  2013   Normal  . CARPAL TUNNEL RELEASE Bilateral   . CATARACT EXTRACTION, BILATERAL    . CHOLECYSTECTOMY    . COLONOSCOPY    . DIAGNOSTIC LAPAROSCOPY     multiple times  . DILATION AND CURETTAGE OF UTERUS    . ESOPHAGOGASTRODUODENOSCOPY (EGD) WITH PROPOFOL N/A 11/01/2014   Procedure: ESOPHAGOGASTRODUODENOSCOPY (EGD) WITH  PROPOFOL;  Surgeon: Hulen Luster, MD;  Location: [facility] ENDOSCOPY;  Service: Gastroenterology;  Laterality: N/A;  . HARDWARE REMOVAL Left 10/11/2016   Procedure: HARDWARE REMOVAL-LEFT RADIUS;  Surgeon: Lovell Sheehan, MD;  Location: [facility] ORS;  Service: Orthopedics;  Laterality: Left;  Left Radius Wrist   . IMPLANTABLE CONTACT LENS IMPLANTATION     bilateral  . LAMINECTOMY  11/13/2015  . LUMBAR FUSION  11/2015  . LUMBAR WOUND DEBRIDEMENT N/A 12/02/2015   Procedure: WOUND Exploration;  Surgeon: Consuella Lose, MD;  Location: [place];  Service: Neurosurgery;  Laterality: N/A;  . OPEN REDUCTION INTERNAL FIXATION (ORIF) DISTAL RADIAL FRACTURE Left 08/29/2016   Procedure: OPEN REDUCTION INTERNAL FIXATION (ORIF) DISTAL RADIAL FRACTURE;  Surgeon: Lovell Sheehan, MD;  Location: [facility] ORS;  Service: Orthopedics;  Laterality: Left;  . PICC LINE PLACE PERIPHERAL ([place] HX)     right upper arm   . ROTATOR CUFF REPAIR Left   . SAVORY DILATION N/A 11/01/2014   Procedure: SAVORY DILATION;  Surgeon: Hulen Luster, MD;  Location: [facility] ENDOSCOPY;  Service: Gastroenterology;  Laterality: N/A;  . TONSILLECTOMY    . TRIGGER FINGER RELEASE Bilateral      Home Meds: Prior to Admission medications   Medication Sig Start Date End Date Taking? Authorizing Provider  albuterol (PROVENTIL HFA;VENTOLIN HFA) 108 (90 Base) MCG/ACT inhaler Inhale 2 puffs into the lungs every 6 (six) hours as needed for wheezing or shortness of breath. 01/09/17  Yes Hackney, Tina A, FNP  Alirocumab (PRALUENT) 150 MG/ML SOPN Inject 1 pen into the skin every 14 (fourteen) days. Patient taking differently: Inject 150 mg into the skin every 14 (fourteen) days.  06/13/17  Yes Minna Merritts, MD  aspirin 81 MG tablet Take 81 mg by mouth at bedtime.    Yes [provider]  Azelastine HCl (ASTEPRO) 0.15 % SOLN Place 2 sprays into both nostrils at bedtime.    Yes [provider]  Cholecalciferol (VITAMIN D) 2000 units tablet Take 2,000  Units by mouth daily.   Yes [provider]  desonide (DESOWEN) 0.05 % lotion Apply 1 application topically daily.   Yes [provider]  esomeprazole (NEXIUM) 40 MG capsule Take 40 mg by mouth 2 (two) times daily before a meal.    Yes [provider]  etodolac (LODINE) 400 MG tablet Take 400 mg by mouth 2 (two) times daily. 06/08/17  Yes [provider]  ezetimibe (ZETIA) 10 MG tablet Take 1 tablet (10 mg total) by mouth daily. 06/13/17  Yes Gollan, Kathlene November, MD  gabapentin (NEURONTIN) 300 MG capsule Take 300 mg by mouth 3 (three) times  daily.    Yes [provider]  hyoscyamine (LEVSIN SL) 0.125 MG SL tablet Place 2 tablets (0.25 mg total) under the tongue every 4 (four) hours as needed for cramping. 06/25/16  Yes Theodoro Grist, MD  insulin aspart (NOVOLOG) 100 UNIT/ML FlexPen Inject 10-16 Units into the skin See admin instructions. 10 units with breakfast, 10 units with lunch, 16 units at dinner, >200 increase by 2 units. 03/01/15 07/23/17 Yes [provider]  insulin glargine (LANTUS) 100 UNIT/ML injection Inject 32 Units into the skin daily.    Yes [provider]  ipratropium-albuterol (DUONEB) 0.5-2.5 (3) MG/3ML SOLN Take 3 mLs by nebulization every 6 (six) hours. Patient taking differently: Take 3 mLs by nebulization every 6 (six) hours as needed.  10/14/16  Yes Vaughan Basta, MD  levothyroxine (SYNTHROID, LEVOTHROID) 75 MCG tablet Take 75 mcg by mouth daily before breakfast.  11/24/12  Yes [provider]  lisinopril (PRINIVIL,ZESTRIL) 5 MG tablet Take 1 tablet (5 mg total) by mouth daily. 06/13/17  Yes Gollan, Kathlene November, MD  metoprolol succinate (TOPROL-XL) 25 MG 24 hr tablet Take 1 tablet (25 mg total) by mouth 2 (two) times daily. 06/13/17  Yes Gollan, Kathlene November, MD  nitroGLYCERIN (NITROSTAT) 0.4 MG SL tablet Place 1 tablet (0.4 mg total) under the tongue every 5 (five) minutes as needed for chest pain. 03/06/16 09/18/17  Yes Gollan, Kathlene November, MD  potassium chloride SA (K-DUR,KLOR-CON) 20 MEQ tablet Take 1 tablet (20 mEq total) by mouth daily. 06/13/17  Yes Minna Merritts, MD  ranolazine (RANEXA) 1000 MG SR tablet Take 1 tablet (1,000 mg total) by mouth 2 (two) times daily. 06/13/17  Yes Gollan, Kathlene November, MD  ropinirole (REQUIP) 5 MG tablet Take 5 mg by mouth 2 (two) times daily. Takes in afternoon and evening   Yes [provider]  temazepam (RESTORIL) 30 MG capsule Take 30 mg by mouth at bedtime.   Yes [provider]  torsemide (DEMADEX) 20 MG tablet Take 1 tablet (20 mg total) by mouth daily. 06/13/17  Yes Gollan, Kathlene November, MD  traMADol (ULTRAM) 50 MG tablet Take 50 mg by mouth every 6 (six) hours as needed.   Yes [provider]    Inpatient Medications: Scheduled Meds: . aspirin EC  81 mg Oral QHS  . gabapentin  300 mg Oral TID  . insulin aspart  0-15 Units Subcutaneous TID WC  . insulin aspart  0-5 Units Subcutaneous QHS  . levothyroxine  75 mcg Oral QAC breakfast  . pantoprazole  40 mg Oral BID  . ranolazine  1,000 mg Oral BID  . ropinirole  5 mg Oral BID  . temazepam  30 mg Oral QHS  . torsemide  20 mg Oral Daily   Continuous Infusions: . heparin 850 Units/hr (07/17/17 0811)  . nitroGLYCERIN Stopped (07/16/17 2038)   PRN Meds: acetaminophen **OR** acetaminophen, ipratropium-albuterol, nitroGLYCERIN, ondansetron **OR** ondansetron (ZOFRAN) IV  Allergies:   Allergies  Allergen Reactions  . Ceftin [Cefuroxime Axetil] Diarrhea  . Codeine Rash       . Penicillins Rash    Has patient had a PCN reaction causing immediate rash, facial/tongue/throat swelling, SOB or lightheadedness with hypotension: NO Has patient had a PCN reaction causing severe rash involving mucus membranes or skin necrosis: NO Has patient had a PCN retioion that required hospitalization NO Has patient had a PCN reaction occurring within the last 10 years: NO If all of the above answers are "NO",  then may proceed  with Cephalosporin use.  . Vicodin [Hydrocodone-Acetaminophen] Other (See Comments)    passes out    Social History:   Social History   Socioeconomic History  . Marital status: Married    Spouse name: Not on file  . Number of children: Not on file  . Years of education: Not on file  . Highest education level: Not on file  Occupational History  . Occupation: retired  Scientific laboratory technician  . Financial resource strain: Not on file  . Food insecurity:    Worry: Not on file    Inability: Not on file  . Transportation needs:    Medical: Not on file    Non-medical: Not on file  Tobacco Use  . Smoking status: Never Smoker  . Smokeless tobacco: Never Used  Substance and Sexual Activity  . Alcohol use: No  . Drug use: No  . Sexual activity: Not on file  Lifestyle  . Physical activity:    Days per week: Not on file    Minutes per session: Not on file  . Stress: Not on file  Relationships  . Social connections:    Talks on phone: Not on file    Gets together: Not on file    Attends religious service: Not on file    Active member of club or organization: Not on file    Attends meetings of clubs or organizations: Not on file    Relationship status: Not on file  . Intimate partner violence:    Fear of current or ex partner: Not on file    Emotionally abused: Not on file    Physically abused: Not on file    Forced sexual activity: Not on file  Other Topics Concern  . Not on file  Social History Narrative  . Not on file     Family History:  Family History  Problem Relation Age of Onset  . Heart disease Mother   . Breast cancer Mother 45  . Heart disease Father   . Heart attack Sister   . Heart disease Sister   . Heart disease Brother     ROS:  Review of Systems  Constitutional: Positive for malaise/fatigue. Negative for chills, diaphoresis, fever and weight loss.  HENT: Negative for congestion.   Eyes: Negative for discharge and redness.  Respiratory:  Positive for shortness of breath. Negative for cough, hemoptysis, sputum production and wheezing.   Cardiovascular: Positive for chest pain and palpitations. Negative for orthopnea, claudication, leg swelling and PND.  Gastrointestinal: Negative for abdominal pain, blood in stool, heartburn, melena, nausea and vomiting.  Genitourinary: Negative for hematuria.  Musculoskeletal: Negative for falls and myalgias.  Skin: Negative for rash.  Neurological: Positive for weakness. Negative for dizziness, tingling, tremors, sensory change, speech change, focal weakness and loss of consciousness.  Endo/Heme/Allergies: Does not bruise/bleed easily.  Psychiatric/Behavioral: Negative for substance abuse. The patient is nervous/anxious.   All other systems reviewed and are negative.     Physical Exam/Data:   Vitals:   07/16/17 2243 07/17/17 0359 07/17/17 0400 07/17/17 0728  BP: (!) 124/55  (!) 106/53 118/62  Pulse: 82 95 86 86  Resp: (!) 6 16    Temp: 98.2 F (36.8 C) (!) 97.5 F (36.4 C)  98 F (36.7 C)  TempSrc: Oral Oral  Oral  SpO2: 99% 91%  95%  Weight: 224 lb 8 oz (101.8 kg)     Height:        Intake/Output Summary (Last 24 hours) at 07/17/2017 0931  Last data filed at 07/17/2017 0914 Gross per 24 hour  Intake -  Output 0 ml  Net 0 ml   Filed Weights   07/16/17 1855 07/16/17 2243  Weight: 229 lb (103.9 kg) 224 lb 8 oz (101.8 kg)   Body mass index is 42.42 kg/m.   Physical Exam: General: Well developed, well nourished, in no acute distress. Head: Normocephalic, atraumatic, sclera non-icteric, no xanthomas, nares without discharge.  Neck: Negative for carotid bruits. JVD not elevated. Lungs: Clear bilaterally to auscultation without wheezes, rales, or rhonchi. Breathing is unlabored. Heart: RRR with S1 S2. No murmurs, rubs, or gallops appreciated. Abdomen: Soft, non-tender, non-distended with normoactive bowel sounds. No hepatomegaly. No rebound/guarding. No obvious abdominal  masses. Msk:  Strength and tone appear normal for age. Extremities: No clubbing or cyanosis. No edema. Distal pedal pulses are 2+ and equal bilaterally. Neuro: Alert and oriented X 3. No facial asymmetry. No focal deficit. Moves all extremities spontaneously. Psych:  Responds to questions appropriately with a normal affect.   EKG:  The EKG was personally reviewed and demonstrates: NSR, 100 bpm, no acute st/t changes  Telemetry:  Telemetry was personally reviewed and demonstrates: NSR with sinus tachycardia   Weights: Filed Weights   07/16/17 1855 07/16/17 2243  Weight: 229 lb (103.9 kg) 224 lb 8 oz (101.8 kg)    Relevant CV Studies: TTE 07/2016: Study Conclusions  - Procedure narrative: Transthoracic echocardiography. The study   was technically difficult. - Left ventricle: The cavity size was normal. There was mild   concentric hypertrophy. Systolic function was normal. The   estimated ejection fraction was in the range of 60% to 65%. Wall   motion was normal; there were no regional wall motion   abnormalities. Doppler parameters are consistent with abnormal   left ventricular relaxation (grade 1 diastolic dysfunction). - Left atrium: The atrium was normal in size. - Right ventricle: Systolic function was normal. - Pulmonary arteries: Systolic pressure could not be accurately   estimated.   Outpatient cardiac monitoring pending  Laboratory Data:  Chemistry Recent Labs  Lab 07/16/17 1906 07/17/17 0601  NA 137 140  K 3.6 3.5  CL 101 103  CO2 27 29  GLUCOSE 185* 130*  BUN 19 17  CREATININE 1.29* 1.18*  CALCIUM 8.8* 8.7*  GFRNONAA 40* 45*  GFRAA 47* 52*  ANIONGAP 9 8    No results for input(s): PROT, ALBUMIN, AST, ALT, ALKPHOS, BILITOT in the last 168 hours. Hematology Recent Labs  Lab 07/16/17 1906 07/17/17 0601  WBC 7.5 6.3  RBC 3.98 3.84  HGB 12.3 12.0  HCT 36.4 35.2  MCV 91.3 91.6  MCH 30.9 31.3  MCHC 33.8 34.2  RDW 14.6* 15.0*  PLT 270 252    Cardiac Enzymes Recent Labs  Lab 07/16/17 1906 07/17/17 0008 07/17/17 0601  TROPONINI <0.03 <0.03 <0.03   No results for input(s): TROPIPOC in the last 168 hours.  BNPNo results for input(s): BNP, PROBNP in the last 168 hours.  DDimer No results for input(s): DDIMER in the last 168 hours.  Radiology/Studies:  Dg Chest Portable 1 View  Result Date: 07/16/2017 IMPRESSION: No active disease. Electronically Signed   By: Earle Gell M.D.   On: 07/16/2017 19:24    Assessment and Plan:   1. Chest pain with moderate risk for cardiac etiology/nonobstructive CAD: -Chest pain free -Has ruled out -For Myoview this morning -TTE pending -Check BNP -ASA -Ranexa  2. Chronic diastolic CHF: -She does not appear grossly volume up  at this time -BNP pending -Await TTE -Continue PTA torsemide   3. Palpitations: -Outpatient cardiac monitoring pending -She has noted palpitations since being on inpatient telemetry with no significant arrhythmia being noted -Check TSH and magnesium  -Replete potassium to goal > 4.0  4. HTN: -Well controlled -Torsemide 20 mg daily as above  5. HLD: -Statin intolerant  -On Praluent     For questions or updates, please contact [place] Please consult [email or web address] for contact info under Cardiology/STEMI.   Signed, Christell Faith, PA-C [place] Pager: [phone] 07/17/2017, 9:31 AM

## 2017-07-18 ENCOUNTER — Telehealth: Payer: Self-pay | Admitting: *Deleted

## 2017-07-18 NOTE — Telephone Encounter (Signed)
TCM- patient discharged 07/17/17

## 2017-07-18 NOTE — Telephone Encounter (Signed)
Patient contacted regarding discharge from [facility] on 07/17/17.   Patient understands to follow up with provider ? On 07/30/17 at 2:20 pm at [facility].  Patient understands discharge instructions? Yes  Patient understands medications and regiment? Yes  Patient understands to bring all medications to this visit? Yes

## 2017-07-18 NOTE — Telephone Encounter (Signed)
-----   Message from Blain Pais sent at 07/18/2017  8:18 AM EDT ----- Regarding: TOC 6/18 2:20 DR.Rockey Situ

## 2017-07-19 ENCOUNTER — Telehealth: Payer: Self-pay | Admitting: Cardiovascular Disease

## 2017-07-19 NOTE — Telephone Encounter (Signed)
I called and spoke with the patient. She is aware of her echo and stress test results.

## 2017-07-23 ENCOUNTER — Ambulatory Visit
Admission: RE | Admit: 2017-07-23 | Discharge: 2017-07-23 | Disposition: A | Discharge status: Home / Self Care | Payer: Medicare Other | Source: Ambulatory Visit | Attending: Internal Medicine | Admitting: Internal Medicine

## 2017-07-23 ENCOUNTER — Ambulatory Visit: Admission: RE | Admit: 2017-07-23 | Payer: Medicare Other | Source: Ambulatory Visit

## 2017-07-23 ENCOUNTER — Other Ambulatory Visit: Payer: Self-pay | Admitting: Internal Medicine

## 2017-07-23 DIAGNOSIS — I700 Atherosclerosis of aorta: Secondary | ICD-10-CM | POA: Diagnosis not present

## 2017-07-23 DIAGNOSIS — R1084 Generalized abdominal pain: Secondary | ICD-10-CM | POA: Diagnosis present

## 2017-07-23 DIAGNOSIS — M5136 Other intervertebral disc degeneration, lumbar region: Secondary | ICD-10-CM | POA: Insufficient documentation

## 2017-07-23 DIAGNOSIS — Z981 Arthrodesis status: Secondary | ICD-10-CM | POA: Insufficient documentation

## 2017-07-23 MED ORDER — IOPAMIDOL (ISOVUE-300) INJECTION 61%
80.0000 mL | Freq: Once | INTRAVENOUS | Status: AC | PRN
  Administered 2017-07-23: 80 mL via INTRAVENOUS

## 2017-07-26 ENCOUNTER — Other Ambulatory Visit: Payer: Self-pay

## 2017-07-26 ENCOUNTER — Emergency Department: Payer: Medicare Other

## 2017-07-26 ENCOUNTER — Observation Stay
Admission: EM | Admit: 2017-07-26 | Discharge: 2017-07-27 | Disposition: A | Discharge status: Home / Self Care | Payer: Medicare Other | Attending: Internal Medicine | Admitting: Internal Medicine

## 2017-07-26 DIAGNOSIS — K219 Gastro-esophageal reflux disease without esophagitis: Secondary | ICD-10-CM | POA: Insufficient documentation

## 2017-07-26 DIAGNOSIS — E785 Hyperlipidemia, unspecified: Secondary | ICD-10-CM | POA: Diagnosis not present

## 2017-07-26 DIAGNOSIS — K5792 Diverticulitis of intestine, part unspecified, without perforation or abscess without bleeding: Secondary | ICD-10-CM | POA: Diagnosis present

## 2017-07-26 DIAGNOSIS — E119 Type 2 diabetes mellitus without complications: Secondary | ICD-10-CM | POA: Insufficient documentation

## 2017-07-26 DIAGNOSIS — E039 Hypothyroidism, unspecified: Secondary | ICD-10-CM | POA: Insufficient documentation

## 2017-07-26 DIAGNOSIS — Z981 Arthrodesis status: Secondary | ICD-10-CM | POA: Insufficient documentation

## 2017-07-26 DIAGNOSIS — Z8249 Family history of ischemic heart disease and other diseases of the circulatory system: Secondary | ICD-10-CM | POA: Insufficient documentation

## 2017-07-26 DIAGNOSIS — I10 Essential (primary) hypertension: Secondary | ICD-10-CM | POA: Diagnosis not present

## 2017-07-26 DIAGNOSIS — K5732 Diverticulitis of large intestine without perforation or abscess without bleeding: Principal | ICD-10-CM | POA: Diagnosis present

## 2017-07-26 DIAGNOSIS — E876 Hypokalemia: Secondary | ICD-10-CM | POA: Insufficient documentation

## 2017-07-26 DIAGNOSIS — Z880 Allergy status to penicillin: Secondary | ICD-10-CM | POA: Insufficient documentation

## 2017-07-26 DIAGNOSIS — Z794 Long term (current) use of insulin: Secondary | ICD-10-CM | POA: Diagnosis not present

## 2017-07-26 DIAGNOSIS — G4733 Obstructive sleep apnea (adult) (pediatric): Secondary | ICD-10-CM | POA: Diagnosis not present

## 2017-07-26 DIAGNOSIS — Z7982 Long term (current) use of aspirin: Secondary | ICD-10-CM | POA: Diagnosis not present

## 2017-07-26 LAB — URINALYSIS, COMPLETE (UACMP) WITH MICROSCOPIC
BACTERIA UA: NONE SEEN
Bilirubin Urine: NEGATIVE
GLUCOSE, UA: NEGATIVE mg/dL
HGB URINE DIPSTICK: NEGATIVE
KETONES UR: 5 mg/dL — AB
Leukocytes, UA: NEGATIVE
Nitrite: NEGATIVE
PROTEIN: NEGATIVE mg/dL
Specific Gravity, Urine: 1.039 — ABNORMAL HIGH (ref 1.005–1.030)
pH: 6 (ref 5.0–8.0)

## 2017-07-26 LAB — CBC
HEMATOCRIT: 33.7 % — AB (ref 35.0–47.0)
HEMOGLOBIN: 11.5 g/dL — AB (ref 12.0–16.0)
MCH: 30.7 pg (ref 26.0–34.0)
MCHC: 34.1 g/dL (ref 32.0–36.0)
MCV: 90 fL (ref 80.0–100.0)
Platelets: 256 10*3/uL (ref 150–440)
RBC: 3.74 MIL/uL — AB (ref 3.80–5.20)
RDW: 14.5 % (ref 11.5–14.5)
WBC: 8.2 10*3/uL (ref 3.6–11.0)

## 2017-07-26 LAB — COMPREHENSIVE METABOLIC PANEL
ALBUMIN: 3.6 g/dL (ref 3.5–5.0)
ALK PHOS: 101 U/L (ref 38–126)
ALT: 17 U/L (ref 14–54)
ANION GAP: 10 (ref 5–15)
AST: 16 U/L (ref 15–41)
BUN: 14 mg/dL (ref 6–20)
CALCIUM: 9 mg/dL (ref 8.9–10.3)
CHLORIDE: 100 mmol/L — AB (ref 101–111)
CO2: 28 mmol/L (ref 22–32)
Creatinine, Ser: 1.1 mg/dL — ABNORMAL HIGH (ref 0.44–1.00)
GFR calc non Af Amer: 49 mL/min — ABNORMAL LOW (ref >60)
GFR, EST AFRICAN AMERICAN: 57 mL/min — AB (ref >60)
Glucose, Bld: 185 mg/dL — ABNORMAL HIGH (ref 65–99)
POTASSIUM: 3.2 mmol/L — AB (ref 3.5–5.1)
SODIUM: 138 mmol/L (ref 135–145)
Total Bilirubin: 0.5 mg/dL (ref 0.3–1.2)
Total Protein: 6.5 g/dL (ref 6.5–8.1)

## 2017-07-26 LAB — GLUCOSE, CAPILLARY
GLUCOSE-CAPILLARY: 92 mg/dL (ref 65–99)
Glucose-Capillary: 136 mg/dL — ABNORMAL HIGH (ref 65–99)

## 2017-07-26 LAB — LIPASE, BLOOD: LIPASE: 38 U/L (ref 11–51)

## 2017-07-26 MED ORDER — ENOXAPARIN SODIUM 40 MG/0.4ML ~~LOC~~ SOLN
40.0000 mg | Freq: Two times a day (BID) | SUBCUTANEOUS | Status: DC
  Administered 2017-07-26: 40 mg via SUBCUTANEOUS
  Filled 2017-07-26: qty 0.4

## 2017-07-26 MED ORDER — INSULIN ASPART 100 UNIT/ML ~~LOC~~ SOLN
0.0000 [IU] | Freq: Three times a day (TID) | SUBCUTANEOUS | Status: DC
  Administered 2017-07-26: 1 [IU] via SUBCUTANEOUS
  Filled 2017-07-26: qty 1

## 2017-07-26 MED ORDER — ROPINIROLE HCL 1 MG PO TABS
5.0000 mg | ORAL_TABLET | Freq: Two times a day (BID) | ORAL | Status: DC
  Administered 2017-07-26: 5 mg via ORAL
  Filled 2017-07-26: qty 5

## 2017-07-26 MED ORDER — LEVOTHYROXINE SODIUM 50 MCG PO TABS
75.0000 ug | ORAL_TABLET | Freq: Every day | ORAL | Status: DC
  Administered 2017-07-27: 75 ug via ORAL
  Filled 2017-07-26: qty 2

## 2017-07-26 MED ORDER — SODIUM CHLORIDE 0.9 % IV SOLN
1.0000 g | Freq: Three times a day (TID) | INTRAVENOUS | Status: DC
  Administered 2017-07-26 – 2017-07-27 (×2): 1 g via INTRAVENOUS
  Filled 2017-07-26 (×4): qty 1

## 2017-07-26 MED ORDER — ONDANSETRON HCL 4 MG/2ML IJ SOLN: 4.0000 mg | Freq: Four times a day (QID) | INTRAMUSCULAR | Status: DC | PRN

## 2017-07-26 MED ORDER — POTASSIUM CHLORIDE CRYS ER 20 MEQ PO TBCR: 20.0000 meq | EXTENDED_RELEASE_TABLET | Freq: Every day | ORAL | Status: DC

## 2017-07-26 MED ORDER — MORPHINE SULFATE (PF) 4 MG/ML IV SOLN
4.0000 mg | Freq: Once | INTRAVENOUS | Status: AC
  Administered 2017-07-26: 4 mg via INTRAVENOUS
  Filled 2017-07-26: qty 1

## 2017-07-26 MED ORDER — ASPIRIN EC 81 MG PO TBEC
81.0000 mg | DELAYED_RELEASE_TABLET | Freq: Every day | ORAL | Status: DC
  Administered 2017-07-26: 81 mg via ORAL
  Filled 2017-07-26: qty 1

## 2017-07-26 MED ORDER — ONDANSETRON HCL 4 MG/2ML IJ SOLN
4.0000 mg | Freq: Once | INTRAMUSCULAR | Status: AC
  Administered 2017-07-26: 4 mg via INTRAVENOUS
  Filled 2017-07-26: qty 2

## 2017-07-26 MED ORDER — TORSEMIDE 20 MG PO TABS
20.0000 mg | ORAL_TABLET | Freq: Every day | ORAL | Status: DC
  Filled 2017-07-26 (×2): qty 1

## 2017-07-26 MED ORDER — ONDANSETRON HCL 4 MG PO TABS: 4.0000 mg | ORAL_TABLET | Freq: Three times a day (TID) | ORAL | Status: DC | PRN

## 2017-07-26 MED ORDER — EZETIMIBE 10 MG PO TABS
10.0000 mg | ORAL_TABLET | Freq: Every day | ORAL | Status: DC
  Filled 2017-07-26 (×2): qty 1

## 2017-07-26 MED ORDER — IOPAMIDOL (ISOVUE-300) INJECTION 61%
100.0000 mL | Freq: Once | INTRAVENOUS | Status: AC | PRN
  Administered 2017-07-26: 100 mL via INTRAVENOUS

## 2017-07-26 MED ORDER — METOPROLOL SUCCINATE ER 25 MG PO TB24
25.0000 mg | ORAL_TABLET | Freq: Two times a day (BID) | ORAL | Status: DC
  Administered 2017-07-26: 25 mg via ORAL
  Filled 2017-07-26: qty 1

## 2017-07-26 MED ORDER — INSULIN ASPART 100 UNIT/ML ~~LOC~~ SOLN: 0.0000 [IU] | Freq: Every day | SUBCUTANEOUS | Status: DC

## 2017-07-26 MED ORDER — ONDANSETRON HCL 4 MG/2ML IJ SOLN
4.0000 mg | Freq: Four times a day (QID) | INTRAMUSCULAR | Status: DC | PRN
  Administered 2017-07-27: 4 mg via INTRAVENOUS
  Filled 2017-07-26: qty 2

## 2017-07-26 MED ORDER — AZELASTINE HCL 0.1 % NA SOLN
2.0000 | Freq: Every day | NASAL | Status: DC
  Administered 2017-07-26: 2 via NASAL
  Filled 2017-07-26: qty 30

## 2017-07-26 MED ORDER — PANTOPRAZOLE SODIUM 40 MG PO TBEC
40.0000 mg | DELAYED_RELEASE_TABLET | Freq: Every day | ORAL | Status: DC
  Administered 2017-07-26: 40 mg via ORAL
  Filled 2017-07-26: qty 1

## 2017-07-26 MED ORDER — IPRATROPIUM-ALBUTEROL 0.5-2.5 (3) MG/3ML IN SOLN
3.0000 mL | Freq: Four times a day (QID) | RESPIRATORY_TRACT | Status: DC
  Filled 2017-07-26: qty 3

## 2017-07-26 MED ORDER — OXYCODONE HCL 5 MG PO TABS: 5.0000 mg | ORAL_TABLET | Freq: Four times a day (QID) | ORAL | Status: DC | PRN

## 2017-07-26 MED ORDER — VITAMIN D 1000 UNITS PO TABS
2000.0000 [IU] | ORAL_TABLET | Freq: Every day | ORAL | Status: DC
  Administered 2017-07-26: 2000 [IU] via ORAL
  Filled 2017-07-26: qty 2

## 2017-07-26 MED ORDER — SODIUM CHLORIDE 0.9 % IV BOLUS
500.0000 mL | Freq: Once | INTRAVENOUS | Status: AC
  Administered 2017-07-26: 500 mL via INTRAVENOUS

## 2017-07-26 MED ORDER — NITROGLYCERIN 0.4 MG SL SUBL: 0.4000 mg | SUBLINGUAL_TABLET | SUBLINGUAL | Status: DC | PRN

## 2017-07-26 MED ORDER — ONDANSETRON HCL 4 MG PO TABS: 4.0000 mg | ORAL_TABLET | Freq: Four times a day (QID) | ORAL | Status: DC | PRN

## 2017-07-26 MED ORDER — MORPHINE SULFATE (PF) 2 MG/ML IV SOLN
2.0000 mg | INTRAVENOUS | Status: DC | PRN
  Administered 2017-07-26: 2 mg via INTRAVENOUS
  Filled 2017-07-26: qty 1

## 2017-07-26 MED ORDER — CIPROFLOXACIN IN D5W 400 MG/200ML IV SOLN
400.0000 mg | Freq: Once | INTRAVENOUS | Status: AC
  Administered 2017-07-26: 400 mg via INTRAVENOUS
  Filled 2017-07-26: qty 200

## 2017-07-26 MED ORDER — METOCLOPRAMIDE HCL 5 MG PO TABS
5.0000 mg | ORAL_TABLET | Freq: Every day | ORAL | Status: DC
  Filled 2017-07-26: qty 1

## 2017-07-26 MED ORDER — SUCRALFATE 1 GM/10ML PO SUSP
1.0000 g | Freq: Four times a day (QID) | ORAL | Status: DC
  Administered 2017-07-27: 1 g via ORAL
  Filled 2017-07-26: qty 10

## 2017-07-26 MED ORDER — ETODOLAC 400 MG PO TABS
400.0000 mg | ORAL_TABLET | Freq: Two times a day (BID) | ORAL | Status: DC
  Filled 2017-07-26 (×2): qty 1

## 2017-07-26 MED ORDER — RANOLAZINE ER 500 MG PO TB12
1000.0000 mg | ORAL_TABLET | Freq: Two times a day (BID) | ORAL | Status: DC
  Filled 2017-07-26 (×4): qty 2

## 2017-07-26 MED ORDER — ALBUTEROL SULFATE (2.5 MG/3ML) 0.083% IN NEBU: 2.5000 mg | INHALATION_SOLUTION | Freq: Four times a day (QID) | RESPIRATORY_TRACT | Status: DC | PRN

## 2017-07-26 MED ORDER — POTASSIUM CHLORIDE CRYS ER 20 MEQ PO TBCR
40.0000 meq | EXTENDED_RELEASE_TABLET | ORAL | Status: AC
  Administered 2017-07-26: 40 meq via ORAL
  Filled 2017-07-26 (×2): qty 2

## 2017-07-26 MED ORDER — TEMAZEPAM 15 MG PO CAPS
30.0000 mg | ORAL_CAPSULE | Freq: Every day | ORAL | Status: DC
  Administered 2017-07-26: 30 mg via ORAL
  Filled 2017-07-26: qty 2

## 2017-07-26 MED ORDER — METRONIDAZOLE IN NACL 5-0.79 MG/ML-% IV SOLN: 500.0000 mg | Freq: Once | INTRAVENOUS | Status: DC

## 2017-07-26 NOTE — Progress Notes (Signed)
Anticoagulation monitoring(Lovenox):  72 yo  female ordered Lovenox 40 mg Q24h  Filed Weights   07/26/17 0948  Weight: 224 lb (101.6 kg)   BMI 42.35  Lab Results  Component Value Date   CREATININE 1.10 (H) 07/26/2017   CREATININE 1.18 (H) 07/17/2017   CREATININE 1.29 (H) 07/16/2017   Estimated Creatinine Clearance: 50.6 mL/min (A) (by C-G formula based on SCr of 1.1 mg/dL (H)). Hemoglobin & Hematocrit     Component Value Date/Time   HGB 11.5 (L) 07/26/2017 1100   HGB 12.7 07/23/2011 0836   HCT 33.7 (L) 07/26/2017 1100   HCT 38.5 07/23/2011 0836     Per Protocol for Patient with estCrcl >30 ml/min and BMI > 40, will transition to Lovenox 40 mg Q12h.

## 2017-07-26 NOTE — Progress Notes (Signed)
Family Meeting Note  Advance Directive:yes  Today a meeting took place with the Patient , sister at bed side    The following clinical team members were present during this meeting:MD  The following were discussed:Patient's diagnosis: Left lower quadrant abdominal pain, acute diverticulitis, other comorbidities insulin requiring diabetes mellitus, obstructive sleep apnea on CPAP, essential hypertension, hyperlipidemia and treatment plan of care discussed in detail with the patient and her sister at bedside.  They both verbalized understanding of the plan.    Patient's progosis: Unable to determine and Goals for treatment: Full Code  Additional follow-up to be provided: Hospitalist  Time spent during discussion:17 min  Nicholes Mango, MD

## 2017-07-26 NOTE — Progress Notes (Signed)
Pharmacy Antibiotic Note  [name] is a 72 y.o. female admitted on 07/26/2017 with an intra-abdominal infection/diverticulitis  Pharmacy has been consulted for meropenem dosing.  Plan: Based on UptoDate recommendations for this indication the appropriate meropenem dose is 1 gram IV q8h. She is just over the threshold CrCl and I wanted to be fairly aggressive with the initial dosing  Height: 5\' 1"  (154.9 cm) Weight: 224 lb (101.6 kg) IBW/kg (Calculated) : 47.8  Temp (24hrs), Avg:98 F (36.7 C), Min:97.9 F (36.6 C), Max:98 F (36.7 C)  Recent Labs  Lab 07/26/17 1100  WBC 8.2  CREATININE 1.10*    Estimated Creatinine Clearance: 50.6 mL/min (A) (by C-G formula based on SCr of 1.1 mg/dL (H)).    Allergies  Allergen Reactions  . Ceftin [Cefuroxime Axetil] Diarrhea  . Codeine Rash       . Penicillins Rash    Has patient had a PCN reaction causing immediate rash, facial/tongue/throat swelling, SOB or lightheadedness with hypotension: NO Has patient had a PCN reaction causing severe rash involving mucus membranes or skin necrosis: NO Has patient had a PCN retioion that required hospitalization NO Has patient had a PCN reaction occurring within the last 10 years: NO If all of the above answers are "NO", then may proceed with Cephalosporin use.  . Vicodin [Hydrocodone-Acetaminophen] Other (See Comments)    passes out    Antimicrobials this admission: cipro 6/14  Flagyl 6/14 Meropenem 6/14>>  Microbiology results: 6/14 UCx: pending   Thank you for allowing pharmacy to be a part of this patient's care.  Dallie Piles, PharmD 07/26/2017 4:23 PM

## 2017-07-26 NOTE — ED Notes (Addendum)
Report finished att, call back, securing transport

## 2017-07-26 NOTE — H&P (Signed)
[facility] [place] NAME: [name]    MR#:  [id number]  DATE OF BIRTH:  Mar 14, 1945  DATE OF ADMISSION:  07/26/2017  PRIMARY CARE PHYSICIAN: Rusty Aus, MD   REQUESTING/REFERRING PHYSICIAN: Corky Downs  CHIEF COMPLAINT:  Left-sided abdominal pain  HISTORY OF PRESENT ILLNESS:  [name]  is a 72 y.o. female with a known history of insulin requiring diabetes metas, obstructive sleep apnea, hypertension and other medical problems is presenting to the ED with a chief complaint of left lower quadrant abdominal pain started from yesterday afternoon.  Associated with nausea denies any vomiting.  Denies any blood in her stool.  CT abdomen has revealed sigmoid diverticulitis.  Patient is started on IV antibiotics and hospitalist team is called to admit the patient.  PAST MEDICAL HISTORY:   Past Medical History:  Diagnosis Date  . Anginal pain ([place])   . Anxiety   . Arthritis   . CHF (congestive heart failure) ([place])   . Chronic back pain    stenosis.degenerative disc,some scoliosis  . Constipation    takes Stool Softener daily  . Coronary artery disease   . Depression    takes Cymbalta daily  . Diabetes mellitus    Type 2 diabetic. Average fasting blood sugar runs high 170-200  . Diastolic CHF ([place]) 62/69/4854   Per patient, diagnosed in 2018  . Diverticulosis   . E coli infection   . GERD (gastroesophageal reflux disease)    takes Nexium daily  . Headache   . Headache 02/16/2016  . Hemorrhoids   . History of colon polyps    benign  . History of gout    doesn't take any meds  . History of hiatal hernia   . History of vertigo    doesn't take any meds  . Hyperlipidemia    takes Praluent daily  . Hypertension    currently BP medications are on hold   . Hypothyroidism    takes Synthroid daily  . Insomnia    takes Restoril nightly  . Joint pain   . Joint swelling   . Muscle spasm    takes Robaxin as needed  . NSVT  (nonsustained ventricular tachycardia) ([place])   . OSA on CPAP   . Periodic heart flutter   . Peripheral edema    takes LAsix as needed  . Peripheral vascular disease ([place])    AAA as stated per pt / was just discovered and pt states has not been referred to vascular MD   . Restless leg    takes Requip daily  . Rosacea   . Sleep apnea   . Urinary incontinence   . Urinary urgency   . Varicose veins   . Weakness    numbness and tingling.    PAST SURGICAL HISTOIRY:   Past Surgical History:  Procedure Laterality Date  . ABDOMINAL HYSTERECTOMY     with BSo  . CARDIAC CATHETERIZATION  2013   Normal  . CARPAL TUNNEL RELEASE Bilateral   . CATARACT EXTRACTION, BILATERAL    . CHOLECYSTECTOMY    . COLONOSCOPY    . DIAGNOSTIC LAPAROSCOPY     multiple times  . DILATION AND CURETTAGE OF UTERUS    . ESOPHAGOGASTRODUODENOSCOPY (EGD) WITH PROPOFOL N/A 11/01/2014   Procedure: ESOPHAGOGASTRODUODENOSCOPY (EGD) WITH PROPOFOL;  Surgeon: Hulen Luster, MD;  Location[facility] ENDOSCOPY;  Service: Gastroenterology;  Laterality: N/A;  . HARDWARE REMOVAL Left 10/11/2016   Procedure: HARDWARE REMOVAL-LEFT RADIUS;  Surgeon: Lovell Sheehan, MD;  Location: [facility] ORS;  Service: Orthopedics;  Laterality: Left;  Left Radius Wrist   . IMPLANTABLE CONTACT LENS IMPLANTATION     bilateral  . LAMINECTOMY  11/13/2015  . LUMBAR FUSION  11/2015  . LUMBAR WOUND DEBRIDEMENT N/A 12/02/2015   Procedure: WOUND Exploration;  Surgeon: Consuella Lose, MD;  Location: [place];  Service: Neurosurgery;  Laterality: N/A;  . OPEN REDUCTION INTERNAL FIXATION (ORIF) DISTAL RADIAL FRACTURE Left 08/29/2016   Procedure: OPEN REDUCTION INTERNAL FIXATION (ORIF) DISTAL RADIAL FRACTURE;  Surgeon: Lovell Sheehan, MD;  Location: [facility] ORS;  Service: Orthopedics;  Laterality: Left;  . PICC LINE PLACE PERIPHERAL ([place] HX)     right upper arm   . ROTATOR CUFF REPAIR Left   . SAVORY DILATION N/A 11/01/2014   Procedure: SAVORY DILATION;  Surgeon: Hulen Luster, MD;  Location: [facility] ENDOSCOPY;  Service: Gastroenterology;  Laterality: N/A;  . TONSILLECTOMY    . TRIGGER FINGER RELEASE Bilateral     SOCIAL HISTORY:   Social History   Tobacco Use  . Smoking status: Never Smoker  . Smokeless tobacco: Never Used  Substance Use Topics  . Alcohol use: No    FAMILY HISTORY:   Family History  Problem Relation Age of Onset  . Heart disease Mother   . Breast cancer Mother 1  . Heart disease Father   . Heart attack Sister   . Heart disease Sister   . Heart disease Brother     DRUG ALLERGIES:   Allergies  Allergen Reactions  . Ceftin [Cefuroxime Axetil] Diarrhea  . Codeine Rash       . Penicillins Rash    Has patient had a PCN reaction causing immediate rash, facial/tongue/throat swelling, SOB or lightheadedness with hypotension: NO Has patient had a PCN reaction causing severe rash involving mucus membranes or skin necrosis: NO Has patient had a PCN retioion that required hospitalization NO Has patient had a PCN reaction occurring within the last 10 years: NO If all of the above answers are "NO", then may proceed with Cephalosporin use.  . Vicodin [Hydrocodone-Acetaminophen] Other (See Comments)    passes out    REVIEW OF SYSTEMS:  CONSTITUTIONAL: No fever, fatigue or weakness.  EYES: No blurred or double vision.  EARS, NOSE, AND THROAT: No tinnitus or ear pain.  RESPIRATORY: No cough, shortness of breath, wheezing or hemoptysis.  CARDIOVASCULAR: No chest pain, orthopnea, edema.  GASTROINTESTINAL:  reporting left-sided abdominal pain and nausea denies any vomiting or hematemesis  GENITOURINARY: No dysuria, hematuria.  ENDOCRINE: No polyuria, nocturia,  HEMATOLOGY: No anemia, easy bruising or bleeding SKIN: No rash or lesion. MUSCULOSKELETAL: No joint pain or arthritis.   NEUROLOGIC: No tingling, numbness, weakness.  PSYCHIATRY: No anxiety or depression.   MEDICATIONS AT HOME:   Prior to Admission medications    Medication Sig Start Date End Date Taking? Authorizing Provider  Alirocumab (PRALUENT) 150 MG/ML SOPN Inject 1 pen into the skin every 14 (fourteen) days. Patient taking differently: Inject 150 mg into the skin every 14 (fourteen) days.  06/13/17  Yes Minna Merritts, MD  aspirin 81 MG tablet Take 81 mg by mouth at bedtime.    Yes [provider]  Azelastine HCl (ASTEPRO) 0.15 % SOLN Place 2 sprays into both nostrils at bedtime.    Yes [provider]  CARAFATE 1 GM/10ML suspension Take 10 mLs by mouth QID. 07/19/17  Yes [provider]  Cholecalciferol (VITAMIN D) 2000 units tablet Take  2,000 Units by mouth daily.   Yes [provider]  desonide (DESOWEN) 0.05 % lotion Apply 1 application topically daily.   Yes [provider]  esomeprazole (NEXIUM) 40 MG capsule Take 40 mg by mouth 2 (two) times daily before a meal.    Yes [provider]  etodolac (LODINE) 400 MG tablet Take 400 mg by mouth 2 (two) times daily. 06/08/17  Yes [provider]  ezetimibe (ZETIA) 10 MG tablet Take 1 tablet (10 mg total) by mouth daily. 06/13/17  Yes Gollan, Kathlene November, MD  insulin aspart (NOVOLOG) 100 UNIT/ML FlexPen Inject 10-16 Units into the skin See admin instructions. 10 units with breakfast, 10 units with lunch, 16 units at dinner, >200 increase by 2 units. 03/01/15 07/26/17 Yes [provider]  insulin glargine (LANTUS) 100 UNIT/ML injection Inject 32 Units into the skin daily.    Yes [provider]  levothyroxine (SYNTHROID, LEVOTHROID) 75 MCG tablet Take 75 mcg by mouth daily before breakfast.  11/24/12  Yes [provider]  lisinopril (PRINIVIL,ZESTRIL) 5 MG tablet Take 1 tablet (5 mg total) by mouth daily. 06/13/17  Yes Gollan, Kathlene November, MD  metoCLOPramide (REGLAN) 5 MG tablet Take 1 tablet by mouth daily. 07/19/17  Yes [provider]  metoprolol succinate (TOPROL-XL) 25 MG 24 hr tablet Take 1 tablet (25 mg total) by  mouth 2 (two) times daily. 06/13/17  Yes Gollan, Kathlene November, MD  ondansetron (ZOFRAN) 4 MG tablet Take 1 tablet by mouth every 8 (eight) hours as needed. 07/19/17  Yes [provider]  potassium chloride SA (K-DUR,KLOR-CON) 20 MEQ tablet Take 1 tablet (20 mEq total) by mouth daily. 06/13/17  Yes Minna Merritts, MD  ranolazine (RANEXA) 1000 MG SR tablet Take 1 tablet (1,000 mg total) by mouth 2 (two) times daily. 06/13/17  Yes Gollan, Kathlene November, MD  ropinirole (REQUIP) 5 MG tablet Take 5 mg by mouth 2 (two) times daily. Takes in afternoon and evening   Yes [provider]  temazepam (RESTORIL) 30 MG capsule Take 30 mg by mouth at bedtime.   Yes [provider]  torsemide (DEMADEX) 20 MG tablet Take 1 tablet (20 mg total) by mouth daily. Take an extra 20mg  in the afternoon if weight gain or increased fluid retention noted. 07/17/17  Yes Gladstone Lighter, MD  albuterol (PROVENTIL HFA;VENTOLIN HFA) 108 (90 Base) MCG/ACT inhaler Inhale 2 puffs into the lungs every 6 (six) hours as needed for wheezing or shortness of breath. 01/09/17   Alisa Graff, FNP  hyoscyamine (LEVSIN SL) 0.125 MG SL tablet Place 2 tablets (0.25 mg total) under the tongue every 4 (four) hours as needed for cramping. Patient not taking: Reported on 07/26/2017 06/25/16   Theodoro Grist, MD  ipratropium-albuterol (DUONEB) 0.5-2.5 (3) MG/3ML SOLN Take 3 mLs by nebulization every 6 (six) hours. Patient taking differently: Take 3 mLs by nebulization every 6 (six) hours as needed.  10/14/16   Vaughan Basta, MD  nitroGLYCERIN (NITROSTAT) 0.4 MG SL tablet Place 1 tablet (0.4 mg total) under the tongue every 5 (five) minutes as needed for chest pain. 03/06/16 09/18/17  Minna Merritts, MD      VITAL SIGNS:  Blood pressure 140/66, pulse 94, temperature 98 F (36.7 C), temperature source Oral, resp. rate 17, height 5\' 1"  (1.549 m), weight 101.6 kg (224 lb), SpO2 95 %.  PHYSICAL EXAMINATION:  GENERAL:  72  y.o.-year-old patient lying in the bed with no acute distress.  EYES: Pupils  equal, round, reactive to light and accommodation. No scleral icterus. Extraocular muscles intact.  HEENT: Head atraumatic, normocephalic. Oropharynx and nasopharynx clear.  NECK:  Supple, no jugular venous distention. No thyroid enlargement, no tenderness.  LUNGS: Normal breath sounds bilaterally, no wheezing, rales,rhonchi or crepitation. No use of accessory muscles of respiration.  CARDIOVASCULAR: S1, S2 normal. No murmurs, rubs, or gallops.  ABDOMEN: Soft, minimal left-sided tenderness no rebound tenderness nondistended. Bowel sounds present.  EXTREMITIES: No pedal edema, cyanosis, or clubbing.  NEUROLOGIC: Cranial nerves II through XII are intact. Muscle strength 5/5 in all extremities. Sensation intact. Gait not checked.  PSYCHIATRIC: The patient is alert and oriented x 3.  SKIN: No obvious rash, lesion, or ulcer.   LABORATORY PANEL:   CBC Recent Labs  Lab 07/26/17 1100  WBC 8.2  HGB 11.5*  HCT 33.7*  PLT 256   ------------------------------------------------------------------------------------------------------------------  Chemistries  Recent Labs  Lab 07/26/17 1100  NA 138  K 3.2*  CL 100*  CO2 28  GLUCOSE 185*  BUN 14  CREATININE 1.10*  CALCIUM 9.0  AST 16  ALT 17  ALKPHOS 101  BILITOT 0.5   ------------------------------------------------------------------------------------------------------------------  Cardiac Enzymes No results for input(s): TROPONINI in the last 168 hours. ------------------------------------------------------------------------------------------------------------------  RADIOLOGY:  Ct Abdomen Pelvis W Contrast  Result Date: 07/26/2017 CLINICAL DATA:  Severe abdominal pain for 1 day. Pain mostly in the left lower quadrant. Hospitalized for chest pain last week. EXAM: CT ABDOMEN AND PELVIS WITH CONTRAST TECHNIQUE: Multidetector CT imaging of the abdomen and  pelvis was performed using the standard protocol following bolus administration of intravenous contrast. CONTRAST:  178mL ISOVUE-300 IOPAMIDOL (ISOVUE-300) INJECTION 61% COMPARISON:  Abdominopelvic CT 07/23/2017 and 06/24/2016. FINDINGS: Lower chest: There is a small calcified left lower lobe granuloma. The lung bases are otherwise clear. There is no pleural or pericardial effusion. Atherosclerosis of the aorta and coronary arteries noted. Hepatobiliary: There is stable focal fat adjacent to the falciform ligament (image 17/2). No suspicious hepatic findings. There is stable mild extrahepatic biliary dilatation status post cholecystectomy. Pancreas: Unremarkable. No pancreatic ductal dilatation or surrounding inflammatory changes. Spleen: Normal in size without focal abnormality. Adrenals/Urinary Tract: Both adrenal glands appear normal. The kidneys appear normal without evidence of urinary tract calculus, suspicious lesion or hydronephrosis. No bladder abnormalities are seen. Stomach/Bowel: There is a stable small hiatal hernia. The stomach is decompressed. The small bowel, appendix and proximal colon appear normal. Diverticular changes are present throughout the descending and sigmoid colon. There is mild proximal sigmoid colon wall thickening and surrounding inflammation compatible with mild diverticulitis. No evidence of bowel obstruction, perforation or abscess. Vascular/Lymphatic: There are no enlarged abdominal or pelvic lymph nodes. Mild aortic and branch vessel atherosclerosis. No acute vascular findings. Reproductive: Hysterectomy.  No adnexal mass. Other: Stable small umbilical hernia containing only fat. No free air or ascites. Musculoskeletal: No acute osseous findings. There is solid interbody fusion status post L3 through S1 interbody fusion. 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 IMPRESSION: 1. Findings are compatible with  mild sigmoid diverticulitis. No evidence of perforation, abscess or obstruction. 2. The appendix appears normal. 3. Stable mild extrahepatic biliary dilatation post cholecystectomy. Small hiatal hernia and umbilical hernia containing only fat. Aortic Atherosclerosis (ICD10-I70.0). 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 Electronically Signed   By: Richardean Sale M.D.   On: 07/26/2017 14:08    EKG:   Orders placed or performed during the  hospital encounter of 07/26/17  . ED EKG  . ED EKG  . EKG 12-Lead  . EKG 12-Lead    IMPRESSION AND PLAN:   [name]  is a 72 y.o. female with a known history of insulin requiring diabetes metas, obstructive sleep apnea, hypertension and other medical problems is presenting to the ED with a chief complaint of left lower quadrant abdominal pain started from yesterday afternoon.  Associated with nausea denies any vomiting.  Denies any blood in her stool.  CT abdomen has revealed sigmoid diverticulitis.   #Acute left lower quadrant abdominal pain secondary to acute sigmoid diverticulitis  admit to MedSurg unit  IV antibiotics and IV fluids pain management as needed  #Acute sigmoid diverticulitis on CT scan IV ciprofloxacin was given x1 in the emergency department patient is refusing to take Flagyl which causes her intractable diarrhea, discontinued ciprofloxacin and Flagyl  started on IV Zosyn Hydrate with IV fluids and pain management as needed  GI consult if no improvement   #Insulin requiring diabetes mellitus Currently patient is on clear liquid diet Provide sliding scale insulin and Lantus 10 units for basal coverage  #Essential hypertension continue home medication Toprol-XL and torsemide-  #Hypokalemia replete and recheck in a.m.  #Obstructive sleep apnea CPAP nightly    DVT prophylaxis with Lovenox subcu    All the records are reviewed and case discussed with ED provider. Management  plans discussed with the patient, family and they are in agreement.  CODE STATUS: fc   TOTAL TIME TAKING CARE OF THIS PATIENT: 43 minutes.   Note: This dictation was prepared with Dragon dictation along with smaller phrase technology. Any transcriptional errors that result from this process are unintentional.  Nicholes Mango M.D on 07/26/2017 at 4:07 PM  Between 7am to 6pm - Pager - [phone]  After 6pm go to [email or web address] - password [email or web address] [facility]  Office  [phone]  CC: Primary care physician; Rusty Aus, MD

## 2017-07-26 NOTE — ED Triage Notes (Signed)
Pt reports severe abd pain that has been sharp since lunch time yesterday, pt reports having to be in the hospital last week for chest pain, left lower abd area is painful, states that she had a ct last week of her abd and pelvis that was negative. Pt denies pain with urination, last bm after her ct, but nothing since that day. Pt reports dry heaves

## 2017-07-26 NOTE — ED Notes (Signed)
First Nurse Note:  Patient amb. With cane (hx of back fusion), complaining of abdominal pain.  Alert and oriented.

## 2017-07-26 NOTE — ED Provider Notes (Signed)
[facility] Emergency Department Provider Note   ____________________________________________    I have reviewed the triage vital signs and the nursing notes.   HISTORY  Chief Complaint Abdominal Pain     HPI [name] is a 72 y.o. female who presents with complaints of abdominal pain.  Patient reports severe abdominal pain primarily in the left lower quadrant, started gradually yesterday around midday.  No fevers or chills.  Positive nausea with one episode of vomiting.  Had a CT scan 4 days ago for persistent nausea being worked up by her PCP which was normal.  Pain started 2 days ago.  Has not taken anything for this   Past Medical History:  Diagnosis Date  . Anginal pain ([place])   . Anxiety   . Arthritis   . CHF (congestive heart failure) ([place])   . Chronic back pain    stenosis.degenerative disc,some scoliosis  . Constipation    takes Stool Softener daily  . Coronary artery disease   . Depression    takes Cymbalta daily  . Diabetes mellitus    Type 2 diabetic. Average fasting blood sugar runs high 170-200  . Diastolic CHF ([place]) 95/62/1308   Per patient, diagnosed in 2018  . Diverticulosis   . E coli infection   . GERD (gastroesophageal reflux disease)    takes Nexium daily  . Headache   . Headache 02/16/2016  . Hemorrhoids   . History of colon polyps    benign  . History of gout    doesn't take any meds  . History of hiatal hernia   . History of vertigo    doesn't take any meds  . Hyperlipidemia    takes Praluent daily  . Hypertension    currently BP medications are on hold   . Hypothyroidism    takes Synthroid daily  . Insomnia    takes Restoril nightly  . Joint pain   . Joint swelling   . Muscle spasm    takes Robaxin as needed  . NSVT (nonsustained ventricular tachycardia) ([place])   . OSA on CPAP   . Periodic heart flutter   . Peripheral edema    takes LAsix as needed  . Peripheral vascular disease ([place])    AAA  as stated per pt / was just discovered and pt states has not been referred to vascular MD   . Restless leg    takes Requip daily  . Rosacea   . Sleep apnea   . Urinary incontinence   . Urinary urgency   . Varicose veins   . Weakness    numbness and tingling.    Patient Active Problem List   Diagnosis Date Noted  . Lymphedema 12/18/2016  . Chronic diastolic heart failure ([place]) 08/10/2016  . Hypertensive heart disease 07/25/2016  . Morbid obesity ([place]) 07/25/2016  . Physical deconditioning 07/25/2016  . Pyuria 06/25/2016  . Abdominal pain 06/24/2016  . Abscess of back   . Lumbar degenerative disc disease 11/14/2015  . Anxiety 02/24/2014  . Chest pain 06/09/2012  . CAD (coronary artery disease), native coronary artery 06/09/2012  . Hyperlipidemia 08/09/2011  . Arm pain 06/28/2011  . Diabetes mellitus ([place]) 05/05/2010  . OSA on CPAP 05/05/2010  . HTN (hypertension) 05/05/2010  . Edema 05/05/2010  . HYPERTRIGLYCERIDEMIA 06/08/2008    Past Surgical History:  Procedure Laterality Date  . ABDOMINAL HYSTERECTOMY     with BSo  . CARDIAC CATHETERIZATION  2013   Normal  .  CARPAL TUNNEL RELEASE Bilateral   . CATARACT EXTRACTION, BILATERAL    . CHOLECYSTECTOMY    . COLONOSCOPY    . DIAGNOSTIC LAPAROSCOPY     multiple times  . DILATION AND CURETTAGE OF UTERUS    . ESOPHAGOGASTRODUODENOSCOPY (EGD) WITH PROPOFOL N/A 11/01/2014   Procedure: ESOPHAGOGASTRODUODENOSCOPY (EGD) WITH PROPOFOL;  Surgeon: Hulen Luster, MD;  Location: [facility] ENDOSCOPY;  Service: Gastroenterology;  Laterality: N/A;  . HARDWARE REMOVAL Left 10/11/2016   Procedure: HARDWARE REMOVAL-LEFT RADIUS;  Surgeon: Lovell Sheehan, MD;  Location: [facility] ORS;  Service: Orthopedics;  Laterality: Left;  Left Radius Wrist   . IMPLANTABLE CONTACT LENS IMPLANTATION     bilateral  . LAMINECTOMY  11/13/2015  . LUMBAR FUSION  11/2015  . LUMBAR WOUND DEBRIDEMENT N/A 12/02/2015   Procedure: WOUND Exploration;  Surgeon: Consuella Lose, MD;  Location: [place];  Service: Neurosurgery;  Laterality: N/A;  . OPEN REDUCTION INTERNAL FIXATION (ORIF) DISTAL RADIAL FRACTURE Left 08/29/2016   Procedure: OPEN REDUCTION INTERNAL FIXATION (ORIF) DISTAL RADIAL FRACTURE;  Surgeon: Lovell Sheehan, MD;  Location: [facility] ORS;  Service: Orthopedics;  Laterality: Left;  . PICC LINE PLACE PERIPHERAL ([place] HX)     right upper arm   . ROTATOR CUFF REPAIR Left   . SAVORY DILATION N/A 11/01/2014   Procedure: SAVORY DILATION;  Surgeon: Hulen Luster, MD;  Location: [facility] ENDOSCOPY;  Service: Gastroenterology;  Laterality: N/A;  . TONSILLECTOMY    . TRIGGER FINGER RELEASE Bilateral     Prior to Admission medications   Medication Sig Start Date End Date Taking? Authorizing Provider  albuterol (PROVENTIL HFA;VENTOLIN HFA) 108 (90 Base) MCG/ACT inhaler Inhale 2 puffs into the lungs every 6 (six) hours as needed for wheezing or shortness of breath. 01/09/17   Darylene Price A, FNP  Alirocumab (PRALUENT) 150 MG/ML SOPN Inject 1 pen into the skin every 14 (fourteen) days. Patient taking differently: Inject 150 mg into the skin every 14 (fourteen) days.  06/13/17   Minna Merritts, MD  aspirin 81 MG tablet Take 81 mg by mouth at bedtime.     [provider]  Azelastine HCl (ASTEPRO) 0.15 % SOLN Place 2 sprays into both nostrils at bedtime.     [provider]  Cholecalciferol (VITAMIN D) 2000 units tablet Take 2,000 Units by mouth daily.    [provider]  desonide (DESOWEN) 0.05 % lotion Apply 1 application topically daily.    [provider]  esomeprazole (NEXIUM) 40 MG capsule Take 40 mg by mouth 2 (two) times daily before a meal.     [provider]  etodolac (LODINE) 400 MG tablet Take 400 mg by mouth 2 (two) times daily. 06/08/17   [provider]  ezetimibe (ZETIA) 10 MG tablet Take 1 tablet (10 mg total) by mouth daily. 06/13/17   Minna Merritts, MD  gabapentin (NEURONTIN) 300 MG capsule Take  300 mg by mouth 3 (three) times daily.     [provider]  hyoscyamine (LEVSIN SL) 0.125 MG SL tablet Place 2 tablets (0.25 mg total) under the tongue every 4 (four) hours as needed for cramping. 06/25/16   Theodoro Grist, MD  insulin aspart (NOVOLOG) 100 UNIT/ML FlexPen Inject 10-16 Units into the skin See admin instructions. 10 units with breakfast, 10 units with lunch, 16 units at dinner, >200 increase by 2 units. 03/01/15 07/23/17  [provider]  insulin glargine (LANTUS) 100 UNIT/ML injection Inject 32 Units into the skin daily.  [provider]  ipratropium-albuterol (DUONEB) 0.5-2.5 (3) MG/3ML SOLN Take 3 mLs by nebulization every 6 (six) hours. Patient taking differently: Take 3 mLs by nebulization every 6 (six) hours as needed.  10/14/16   Vaughan Basta, MD  levothyroxine (SYNTHROID, LEVOTHROID) 75 MCG tablet Take 75 mcg by mouth daily before breakfast.  11/24/12   [provider]  lisinopril (PRINIVIL,ZESTRIL) 5 MG tablet Take 1 tablet (5 mg total) by mouth daily. 06/13/17   Minna Merritts, MD  metoprolol succinate (TOPROL-XL) 25 MG 24 hr tablet Take 1 tablet (25 mg total) by mouth 2 (two) times daily. 06/13/17   Minna Merritts, MD  nitroGLYCERIN (NITROSTAT) 0.4 MG SL tablet Place 1 tablet (0.4 mg total) under the tongue every 5 (five) minutes as needed for chest pain. 03/06/16 09/18/17  Minna Merritts, MD  potassium chloride SA (K-DUR,KLOR-CON) 20 MEQ tablet Take 1 tablet (20 mEq total) by mouth daily. 06/13/17   Minna Merritts, MD  ranolazine (RANEXA) 1000 MG SR tablet Take 1 tablet (1,000 mg total) by mouth 2 (two) times daily. 06/13/17   Minna Merritts, MD  ropinirole (REQUIP) 5 MG tablet Take 5 mg by mouth 2 (two) times daily. Takes in afternoon and evening    [provider]  temazepam (RESTORIL) 30 MG capsule Take 30 mg by mouth at bedtime.    [provider]  torsemide (DEMADEX) 20 MG tablet Take 1 tablet (20 mg  total) by mouth daily. Take an extra 20mg  in the afternoon if weight gain or increased fluid retention noted. 07/17/17   Gladstone Lighter, MD  traMADol (ULTRAM) 50 MG tablet Take 50 mg by mouth every 6 (six) hours as needed.    [provider]     Allergies Ceftin [cefuroxime axetil]; Codeine; Penicillins; and Vicodin [hydrocodone-acetaminophen]  Family History  Problem Relation Age of Onset  . Heart disease Mother   . Breast cancer Mother 54  . Heart disease Father   . Heart attack Sister   . Heart disease Sister   . Heart disease Brother     Social History Social History   Tobacco Use  . Smoking status: Never Smoker  . Smokeless tobacco: Never Used  Substance Use Topics  . Alcohol use: No  . Drug use: No    Review of Systems  Constitutional: No fever/chills Eyes: No visual changes.  ENT: No sore throat. Cardiovascular: Denies chest pain. Respiratory: Denies shortness of breath. Gastrointestinal: As above Genitourinary: Negative for dysuria. Musculoskeletal: Negative for back pain. Skin: Negative for rash. Neurological: Negative for headaches   ____________________________________________   PHYSICAL EXAM:  VITAL SIGNS: ED Triage Vitals  Enc Vitals Group     BP 07/26/17 0946 (!) 176/90     Pulse Rate 07/26/17 0946 (!) 117     Resp 07/26/17 0946 20     Temp 07/26/17 0946 98 F (36.7 C)     Temp Source 07/26/17 0946 Oral     SpO2 07/26/17 0946 99 %     Weight 07/26/17 0948 101.6 kg (224 lb)     Height 07/26/17 0948 1.549 m (5\' 1" )     Head Circumference --      Peak Flow --      Pain Score 07/26/17 0947 6     Pain Loc --      Pain Edu? --      Excl. in [place]? --     Constitutional: Alert and oriented.  Pleasant and interactive, uncomfortable appearing  Eyes: Conjunctivae are normal.   Nose: No congestion/rhinnorhea. Mouth/Throat: Mucous membranes are moist.    Cardiovascular: Tachycardia regular rhythm. Grossly normal heart sounds.  Good  peripheral circulation. Respiratory: Normal respiratory effort.  No retractions.  Gastrointestinal: Moderate tenderness to the left lower quadrant . No distention.  No CVA tenderness.  Musculoskeletal: .  Warm and well perfused Neurologic:  Normal speech and language. No gross focal neurologic deficits are appreciated.  Skin:  Skin is warm, dry and intact. No rash noted. Psychiatric: Mood and affect are normal. Speech and behavior are normal.  ____________________________________________   LABS (all labs ordered are listed, but only abnormal results are displayed)  Labs Reviewed  COMPREHENSIVE METABOLIC PANEL - Abnormal; Notable for the following components:      Result Value   Potassium 3.2 (*)    Chloride 100 (*)    Glucose, Bld 185 (*)    Creatinine, Ser 1.10 (*)    GFR calc non Af Amer 49 (*)    GFR calc Af Amer 57 (*)    All other components within normal limits  CBC - Abnormal; Notable for the following components:   RBC 3.74 (*)    Hemoglobin 11.5 (*)    HCT 33.7 (*)    All other components within normal limits  LIPASE, BLOOD  URINALYSIS, COMPLETE (UACMP) WITH MICROSCOPIC   ____________________________________________  EKG  ED ECG REPORT I, Lavonia Drafts, the attending physician, personally viewed and interpreted this ECG.  Date: 07/26/2017  Rate: 112 Rhythm: Sinus tachycardia QRS Axis: normal Intervals: normal ST/T Wave abnormalities: normal Narrative Interpretation: no evidence of acute ischemia  ____________________________________________  RADIOLOGY  CT scan demonstrates sigmoid diverticulitis, no perforation ____________________________________________   PROCEDURES  Procedure(s) performed: No  Procedures   Critical Care performed: No ____________________________________________   INITIAL IMPRESSION / ASSESSMENT AND PLAN / ED COURSE  Pertinent labs & imaging results that were available during my care of the patient were reviewed by me  and considered in my medical decision making (see chart for details).  Patient presents with left lower quadrant pain with significant tenderness.  Differential includes diverticulitis, less likely ureterolithiasis.  Will check labs give IV morphine, IV Zofran and reevaluate  Patient pain improved with IV morphine.  However she is still tender in the left lower quadrant, despite recently having CT scan feel we should reimage to evaluate given her significant pain.  CT scan is consistent with diverticulitis, will treat with IV Cipro, IV Flagyl and additional IV morphine.  Patient will require admission for continued pain.    ____________________________________________   FINAL CLINICAL IMPRESSION(S) / ED DIAGNOSES  Final diagnoses:  Diverticulitis        Note:  This document was prepared using Dragon voice recognition software and may include unintentional dictation errors.    Lavonia Drafts, MD 07/26/17 [phone]

## 2017-07-27 DIAGNOSIS — K5792 Diverticulitis of intestine, part unspecified, without perforation or abscess without bleeding: Secondary | ICD-10-CM | POA: Diagnosis present

## 2017-07-27 DIAGNOSIS — K5732 Diverticulitis of large intestine without perforation or abscess without bleeding: Secondary | ICD-10-CM | POA: Diagnosis not present

## 2017-07-27 LAB — GLUCOSE, CAPILLARY
GLUCOSE-CAPILLARY: 89 mg/dL (ref 65–99)
GLUCOSE-CAPILLARY: 100 mg/dL — AB (ref 65–99)

## 2017-07-27 LAB — COMPREHENSIVE METABOLIC PANEL
ALK PHOS: 95 U/L (ref 38–126)
ALT: 15 U/L (ref 14–54)
ANION GAP: 8 (ref 5–15)
AST: 18 U/L (ref 15–41)
Albumin: 3.3 g/dL — ABNORMAL LOW (ref 3.5–5.0)
BUN: 9 mg/dL (ref 6–20)
CALCIUM: 8.8 mg/dL — AB (ref 8.9–10.3)
CO2: 28 mmol/L (ref 22–32)
Chloride: 104 mmol/L (ref 101–111)
Creatinine, Ser: 0.93 mg/dL (ref 0.44–1.00)
GFR, EST NON AFRICAN AMERICAN: 60 mL/min — AB (ref >60)
Glucose, Bld: 107 mg/dL — ABNORMAL HIGH (ref 65–99)
Potassium: 3.7 mmol/L (ref 3.5–5.1)
Sodium: 140 mmol/L (ref 135–145)
Total Bilirubin: 0.7 mg/dL (ref 0.3–1.2)
Total Protein: 6.2 g/dL — ABNORMAL LOW (ref 6.5–8.1)

## 2017-07-27 LAB — CBC
HCT: 34.3 % — ABNORMAL LOW (ref 35.0–47.0)
HEMOGLOBIN: 11.5 g/dL — AB (ref 12.0–16.0)
MCH: 30.6 pg (ref 26.0–34.0)
MCHC: 33.6 g/dL (ref 32.0–36.0)
MCV: 91.2 fL (ref 80.0–100.0)
PLATELETS: 259 10*3/uL (ref 150–440)
RBC: 3.76 MIL/uL — AB (ref 3.80–5.20)
RDW: 14.7 % — ABNORMAL HIGH (ref 11.5–14.5)
WBC: 5.8 10*3/uL (ref 3.6–11.0)

## 2017-07-27 MED ORDER — ALIROCUMAB 150 MG/ML ~~LOC~~ SOPN: 150.0000 mg | PEN_INJECTOR | SUBCUTANEOUS | Status: DC

## 2017-07-27 MED ORDER — DIPHENHYDRAMINE HCL 25 MG PO CAPS: 25.0000 mg | ORAL_CAPSULE | Freq: Four times a day (QID) | ORAL | Status: DC | PRN

## 2017-07-27 MED ORDER — METRONIDAZOLE 500 MG PO TABS: 500.0000 mg | ORAL_TABLET | Freq: Three times a day (TID) | ORAL | 0 refills | Status: AC

## 2017-07-27 MED ORDER — CIPROFLOXACIN HCL 500 MG PO TABS: 500.0000 mg | ORAL_TABLET | Freq: Two times a day (BID) | ORAL | 0 refills | Status: AC

## 2017-07-27 MED ORDER — IPRATROPIUM-ALBUTEROL 0.5-2.5 (3) MG/3ML IN SOLN: 3.0000 mL | Freq: Four times a day (QID) | RESPIRATORY_TRACT | Status: DC | PRN

## 2017-07-27 MED ORDER — AMOXICILLIN-POT CLAVULANATE 875-125 MG PO TABS: 1.0000 | ORAL_TABLET | Freq: Two times a day (BID) | ORAL | Status: DC

## 2017-07-27 MED ORDER — CIPROFLOXACIN HCL 500 MG PO TABS
500.0000 mg | ORAL_TABLET | Freq: Two times a day (BID) | ORAL | Status: DC
Start: 2017-07-27 — End: 2017-07-27

## 2017-07-27 MED ORDER — METRONIDAZOLE 500 MG PO TABS
500.0000 mg | ORAL_TABLET | Freq: Three times a day (TID) | ORAL | Status: DC
  Filled 2017-07-27 (×3): qty 1

## 2017-07-27 MED ORDER — PROBIOTIC 250 MG PO CAPS: 250.0000 mg | ORAL_CAPSULE | Freq: Two times a day (BID) | ORAL | 0 refills | Status: AC

## 2017-07-27 NOTE — Care Management Obs Status (Signed)
[place] NOTIFICATION   Patient Details  Name: ZENDAYA GROSECLOSE MRN: [id number] Date of Birth: 11-22-1945   Medicare Observation Status Notification Given:  Yes    Debrah Granderson A Clydie Dillen, RN 07/27/2017, 9:18 AM

## 2017-07-27 NOTE — Progress Notes (Signed)
Dr. Benjie Karvonen rounded and ordered discharge home. Discharge instructions, prescriptions and instructions for follow up appointment was reviewed with patient. Questions were encouraged and answered. Her husband is present to transport home. She declined to take scheduled medications here, stating that she would take her medications as home.

## 2017-07-27 NOTE — Progress Notes (Signed)
Pt is refusing scheduled treatments and CPAP. Pt encouraged to use CPAP but refuses

## 2017-07-27 NOTE — Discharge Summary (Signed)
[facility] NAME: [name]    MR#:  [id number]  DATE OF BIRTH:  Jul 31, 1945  DATE OF ADMISSION:  07/26/2017 ADMITTING PHYSICIAN: Nicholes Mango, MD  DATE OF DISCHARGE: 07/27/2017  PRIMARY CARE PHYSICIAN: Rusty Aus, MD    ADMISSION DIAGNOSIS:  Diverticulitis [K57.92]  DISCHARGE DIAGNOSIS:  Active Problems:   Acute diverticulitis   SECONDARY DIAGNOSIS:   Past Medical History:  Diagnosis Date  . Anginal pain ([place])   . Anxiety   . Arthritis   . CHF (congestive heart failure) ([place])   . Chronic back pain    stenosis.degenerative disc,some scoliosis  . Constipation    takes Stool Softener daily  . Coronary artery disease   . Depression    takes Cymbalta daily  . Diabetes mellitus    Type 2 diabetic. Average fasting blood sugar runs high 170-200  . Diastolic CHF ([place]) 79/39/0300   Per patient, diagnosed in 2018  . Diverticulosis   . E coli infection   . GERD (gastroesophageal reflux disease)    takes Nexium daily  . Headache   . Headache 02/16/2016  . Hemorrhoids   . History of colon polyps    benign  . History of gout    doesn't take any meds  . History of hiatal hernia   . History of vertigo    doesn't take any meds  . Hyperlipidemia    takes Praluent daily  . Hypertension    currently BP medications are on hold   . Hypothyroidism    takes Synthroid daily  . Insomnia    takes Restoril nightly  . Joint pain   . Joint swelling   . Muscle spasm    takes Robaxin as needed  . NSVT (nonsustained ventricular tachycardia) ([place])   . OSA on CPAP   . Periodic heart flutter   . Peripheral edema    takes LAsix as needed  . Peripheral vascular disease ([place])    AAA as stated per pt / was just discovered and pt states has not been referred to vascular MD   . Restless leg    takes Requip daily  . Rosacea   . Sleep apnea   . Urinary incontinence   . Urinary urgency   . Varicose veins   . Weakness    numbness  and tingling.    HOSPITAL COURSE:   72 year old female with history of diabetes and essential hypertension who presents with left lower quadrant abdominal pain.  1.  Mild sigmoid acute diverticulitis: This is etiology of patient's left lower quadrant abdominal pain.  Her abdominal pain has subsided.  White blood cell count is within normal limits.  CT showed no evidence of abscess. Initially patient did not want to take Flagyl however she is now willing to take Flagyl and ciprofloxacin for the treatment for acute diverticulitis.  She will take Imodium if the Flagyl causes diarrhea. She will need outpatient colonoscopy in 2 to 3 weeks.  She will follow-up with her PCP.  This is her second time of diverticulitis and may need outpatient surgical evaluation in the future.  2.  Diabetes: Patient will resume outpatient medications with ADA diet  3.  Essential hypertension: Continue metoprolol  4.  Hypothyroidism: Continue Synthroid   DISCHARGE CONDITIONS AND DIET:   Stable for discharge on heart healthy diabetic diet  CONSULTS OBTAINED:    DRUG ALLERGIES:   Allergies  Allergen Reactions  . Ceftin [Cefuroxime Axetil] Diarrhea  .  Codeine Rash       . Penicillins Rash    Has patient had a PCN reaction causing immediate rash, facial/tongue/throat swelling, SOB or lightheadedness with hypotension: NO Has patient had a PCN reaction causing severe rash involving mucus membranes or skin necrosis: NO Has patient had a PCN retioion that required hospitalization NO Has patient had a PCN reaction occurring within the last 10 years: NO If all of the above answers are "NO", then may proceed with Cephalosporin use.  . Vicodin [Hydrocodone-Acetaminophen] Other (See Comments)    passes out    DISCHARGE MEDICATIONS:   Allergies as of 07/27/2017      Reactions   Ceftin [cefuroxime Axetil] Diarrhea   Codeine Rash       Penicillins Rash   Has patient had a PCN reaction causing immediate rash,  facial/tongue/throat swelling, SOB or lightheadedness with hypotension: NO Has patient had a PCN reaction causing severe rash involving mucus membranes or skin necrosis: NO Has patient had a PCN retioion that required hospitalization NO Has patient had a PCN reaction occurring within the last 10 years: NO If all of the above answers are "NO", then may proceed with Cephalosporin use.   Vicodin [hydrocodone-acetaminophen] Other (See Comments)   passes out      Medication List    STOP taking these medications   ASTEPRO 0.15 % Soln Generic drug:  Azelastine HCl   etodolac 400 MG tablet Commonly known as:  LODINE   ezetimibe 10 MG tablet Commonly known as:  ZETIA   lisinopril 5 MG tablet Commonly known as:  PRINIVIL,ZESTRIL     TAKE these medications   albuterol 108 (90 Base) MCG/ACT inhaler Commonly known as:  PROVENTIL HFA;VENTOLIN HFA Inhale 2 puffs into the lungs every 6 (six) hours as needed for wheezing or shortness of breath.   Alirocumab 150 MG/ML Sopn Commonly known as:  PRALUENT Inject 150 mg into the skin every 14 (fourteen) days.   aspirin 81 MG tablet Take 81 mg by mouth at bedtime.   CARAFATE 1 GM/10ML suspension Generic drug:  sucralfate Take 10 mLs by mouth QID.   ciprofloxacin 500 MG tablet Commonly known as:  CIPRO Take 1 tablet (500 mg total) by mouth 2 (two) times daily for 8 days.   desonide 0.05 % lotion Commonly known as:  DESOWEN Apply 1 application topically daily.   esomeprazole 40 MG capsule Commonly known as:  NEXIUM Take 40 mg by mouth 2 (two) times daily before a meal.   hyoscyamine 0.125 MG SL tablet Commonly known as:  LEVSIN SL Place 2 tablets (0.25 mg total) under the tongue every 4 (four) hours as needed for cramping.   insulin aspart 100 UNIT/ML FlexPen Commonly known as:  NOVOLOG Inject 10-16 Units into the skin See admin instructions. 10 units with breakfast, 10 units with lunch, 16 units at dinner, >200 increase by 2  units.   insulin glargine 100 UNIT/ML injection Commonly known as:  LANTUS Inject 32 Units into the skin daily.   ipratropium-albuterol 0.5-2.5 (3) MG/3ML Soln Commonly known as:  DUONEB Take 3 mLs by nebulization every 6 (six) hours as needed.   levothyroxine 75 MCG tablet Commonly known as:  SYNTHROID, LEVOTHROID Take 75 mcg by mouth daily before breakfast.   metoCLOPramide 5 MG tablet Commonly known as:  REGLAN Take 1 tablet by mouth daily.   metoprolol succinate 25 MG 24 hr tablet Commonly known as:  TOPROL-XL Take 1 tablet (25 mg total) by mouth 2 (two)  times daily.   metroNIDAZOLE 500 MG tablet Commonly known as:  FLAGYL Take 1 tablet (500 mg total) by mouth every 8 (eight) hours for 8 days.   nitroGLYCERIN 0.4 MG SL tablet Commonly known as:  NITROSTAT Place 1 tablet (0.4 mg total) under the tongue every 5 (five) minutes as needed for chest pain.   ondansetron 4 MG tablet Commonly known as:  ZOFRAN Take 1 tablet by mouth every 8 (eight) hours as needed.   potassium chloride SA 20 MEQ tablet Commonly known as:  K-DUR,KLOR-CON Take 1 tablet (20 mEq total) by mouth daily.   Probiotic 250 MG Caps Take 250 mg by mouth 2 (two) times daily for 11 days.   ranolazine 1000 MG SR tablet Commonly known as:  RANEXA Take 1 tablet (1,000 mg total) by mouth 2 (two) times daily.   ropinirole 5 MG tablet Commonly known as:  REQUIP Take 5 mg by mouth 2 (two) times daily. Takes in afternoon and evening   temazepam 30 MG capsule Commonly known as:  RESTORIL Take 30 mg by mouth at bedtime.   torsemide 20 MG tablet Commonly known as:  DEMADEX Take 1 tablet (20 mg total) by mouth daily. Take an extra 20mg  in the afternoon if weight gain or increased fluid retention noted.   Vitamin D 2000 units tablet Take 2,000 Units by mouth daily.         Today   CHIEF COMPLAINT:   Abdominal pain has subsided.  No fevers or chills.  She is hungry and would like to eat a regular  diet.   VITAL SIGNS:  Blood pressure (!) 145/69, pulse 79, temperature 98.1 F (36.7 C), temperature source Oral, resp. rate 20, height 5\' 1"  (1.549 m), weight 101.6 kg (224 lb), SpO2 100 %.   REVIEW OF SYSTEMS:  Review of Systems  Constitutional: Negative.  Negative for chills, fever and malaise/fatigue.  HENT: Negative.  Negative for ear discharge, ear pain, hearing loss, nosebleeds and sore throat.   Eyes: Negative.  Negative for blurred vision and pain.  Respiratory: Negative.  Negative for cough, hemoptysis, shortness of breath and wheezing.   Cardiovascular: Negative.  Negative for chest pain, palpitations and leg swelling.  Gastrointestinal: Negative.  Negative for abdominal pain, blood in stool, diarrhea, nausea and vomiting.  Genitourinary: Negative.  Negative for dysuria.  Musculoskeletal: Negative.  Negative for back pain.  Skin: Negative.   Neurological: Negative for dizziness, tremors, speech change, focal weakness, seizures and headaches.  Endo/Heme/Allergies: Negative.  Does not bruise/bleed easily.  Psychiatric/Behavioral: Negative.  Negative for depression, hallucinations and suicidal ideas.     PHYSICAL EXAMINATION:  GENERAL:  72 y.o.-year-old patient lying in the bed with no acute distress.  NECK:  Supple, no jugular venous distention. No thyroid enlargement, no tenderness.  LUNGS: Normal breath sounds bilaterally, no wheezing, rales,rhonchi  No use of accessory muscles of respiration.  CARDIOVASCULAR: S1, S2 normal. No murmurs, rubs, or gallops.  ABDOMEN: Soft, non-tender, non-distended. Bowel sounds present. No organomegaly or mass.  EXTREMITIES: No pedal edema, cyanosis, or clubbing.  PSYCHIATRIC: The patient is alert and oriented x 3.  SKIN: No obvious rash, lesion, or ulcer.   DATA REVIEW:   CBC Recent Labs  Lab 07/27/17 0548  WBC 5.8  HGB 11.5*  HCT 34.3*  PLT 259    Chemistries  Recent Labs  Lab 07/27/17 0548  NA 140  K 3.7  CL 104  CO2  28  GLUCOSE 107*  BUN 9  CREATININE  0.93  CALCIUM 8.8*  AST 18  ALT 15  ALKPHOS 95  BILITOT 0.7    Cardiac Enzymes No results for input(s): TROPONINI in the last 168 hours.  Microbiology Results  @MICRORSLT48 @  RADIOLOGY:  Ct Abdomen Pelvis W Contrast  Result Date: 07/26/2017 CLINICAL DATA:  Severe abdominal pain for 1 day. Pain mostly in the left lower quadrant. Hospitalized for chest pain last week. EXAM: CT ABDOMEN AND PELVIS WITH CONTRAST TECHNIQUE: Multidetector CT imaging of the abdomen and pelvis was performed using the standard protocol following bolus administration of intravenous contrast. CONTRAST:  183mL ISOVUE-300 IOPAMIDOL (ISOVUE-300) INJECTION 61% COMPARISON:  Abdominopelvic CT 07/23/2017 and 06/24/2016. FINDINGS: Lower chest: There is a small calcified left lower lobe granuloma. The lung bases are otherwise clear. There is no pleural or pericardial effusion. Atherosclerosis of the aorta and coronary arteries noted. Hepatobiliary: There is stable focal fat adjacent to the falciform ligament (image 17/2). No suspicious hepatic findings. There is stable mild extrahepatic biliary dilatation status post cholecystectomy. Pancreas: Unremarkable. No pancreatic ductal dilatation or surrounding inflammatory changes. Spleen: Normal in size without focal abnormality. Adrenals/Urinary Tract: Both adrenal glands appear normal. The kidneys appear normal without evidence of urinary tract calculus, suspicious lesion or hydronephrosis. No bladder abnormalities are seen. Stomach/Bowel: There is a stable small hiatal hernia. The stomach is decompressed. The small bowel, appendix and proximal colon appear normal. Diverticular changes are present throughout the descending and sigmoid colon. There is mild proximal sigmoid colon wall thickening and surrounding inflammation compatible with mild diverticulitis. No evidence of bowel obstruction, perforation or abscess. Vascular/Lymphatic: There are no  enlarged abdominal or pelvic lymph nodes. Mild aortic and branch vessel atherosclerosis. No acute vascular findings. Reproductive: Hysterectomy.  No adnexal mass. Other: Stable small umbilical hernia containing only fat. No free air or ascites. Musculoskeletal: No acute osseous findings. There is solid interbody fusion status post L3 through S1 interbody fusion. 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 IMPRESSION: 1. Findings are compatible with mild sigmoid diverticulitis. No evidence of perforation, abscess or obstruction. 2. The appendix appears normal. 3. Stable mild extrahepatic biliary dilatation post cholecystectomy. Small hiatal hernia and umbilical hernia containing only fat. Aortic Atherosclerosis (ICD10-I70.0). 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 Electronically Signed   By: Richardean Sale M.D.   On: 07/26/2017 14:08      Allergies as of 07/27/2017      Reactions   Ceftin [cefuroxime Axetil] Diarrhea   Codeine Rash       Penicillins Rash   Has patient had a PCN reaction causing immediate rash, facial/tongue/throat swelling, SOB or lightheadedness with hypotension: NO Has patient had a PCN reaction causing severe rash involving mucus membranes or skin necrosis: NO Has patient had a PCN retioion that required hospitalization NO Has patient had a PCN reaction occurring within the last 10 years: NO If all of the above answers are "NO", then may proceed with Cephalosporin use.   Vicodin [hydrocodone-acetaminophen] Other (See Comments)   passes out      Medication List    STOP taking these medications   ASTEPRO 0.15 % Soln Generic drug:  Azelastine HCl   etodolac 400 MG tablet Commonly known as:  LODINE   ezetimibe 10 MG tablet Commonly known as:  ZETIA   lisinopril 5 MG tablet Commonly known as:  PRINIVIL,ZESTRIL     TAKE these medications   albuterol  108 (90 Base) MCG/ACT  inhaler Commonly known as:  PROVENTIL HFA;VENTOLIN HFA Inhale 2 puffs into the lungs every 6 (six) hours as needed for wheezing or shortness of breath.   Alirocumab 150 MG/ML Sopn Commonly known as:  PRALUENT Inject 150 mg into the skin every 14 (fourteen) days.   aspirin 81 MG tablet Take 81 mg by mouth at bedtime.   CARAFATE 1 GM/10ML suspension Generic drug:  sucralfate Take 10 mLs by mouth QID.   ciprofloxacin 500 MG tablet Commonly known as:  CIPRO Take 1 tablet (500 mg total) by mouth 2 (two) times daily for 8 days.   desonide 0.05 % lotion Commonly known as:  DESOWEN Apply 1 application topically daily.   esomeprazole 40 MG capsule Commonly known as:  NEXIUM Take 40 mg by mouth 2 (two) times daily before a meal.   hyoscyamine 0.125 MG SL tablet Commonly known as:  LEVSIN SL Place 2 tablets (0.25 mg total) under the tongue every 4 (four) hours as needed for cramping.   insulin aspart 100 UNIT/ML FlexPen Commonly known as:  NOVOLOG Inject 10-16 Units into the skin See admin instructions. 10 units with breakfast, 10 units with lunch, 16 units at dinner, >200 increase by 2 units.   insulin glargine 100 UNIT/ML injection Commonly known as:  LANTUS Inject 32 Units into the skin daily.   ipratropium-albuterol 0.5-2.5 (3) MG/3ML Soln Commonly known as:  DUONEB Take 3 mLs by nebulization every 6 (six) hours as needed.   levothyroxine 75 MCG tablet Commonly known as:  SYNTHROID, LEVOTHROID Take 75 mcg by mouth daily before breakfast.   metoCLOPramide 5 MG tablet Commonly known as:  REGLAN Take 1 tablet by mouth daily.   metoprolol succinate 25 MG 24 hr tablet Commonly known as:  TOPROL-XL Take 1 tablet (25 mg total) by mouth 2 (two) times daily.   metroNIDAZOLE 500 MG tablet Commonly known as:  FLAGYL Take 1 tablet (500 mg total) by mouth every 8 (eight) hours for 8 days.   nitroGLYCERIN 0.4 MG SL tablet Commonly known as:   NITROSTAT Place 1 tablet (0.4 mg total) under the tongue every 5 (five) minutes as needed for chest pain.   ondansetron 4 MG tablet Commonly known as:  ZOFRAN Take 1 tablet by mouth every 8 (eight) hours as needed.   potassium chloride SA 20 MEQ tablet Commonly known as:  K-DUR,KLOR-CON Take 1 tablet (20 mEq total) by mouth daily.   Probiotic 250 MG Caps Take 250 mg by mouth 2 (two) times daily for 11 days.   ranolazine 1000 MG SR tablet Commonly known as:  RANEXA Take 1 tablet (1,000 mg total) by mouth 2 (two) times daily.   ropinirole 5 MG tablet Commonly known as:  REQUIP Take 5 mg by mouth 2 (two) times daily. Takes in afternoon and evening   temazepam 30 MG capsule Commonly known as:  RESTORIL Take 30 mg by mouth at bedtime.   torsemide 20 MG tablet Commonly known as:  DEMADEX Take 1 tablet (20 mg total) by mouth daily. Take an extra 20mg  in the afternoon if weight gain or increased fluid retention noted.   Vitamin D 2000 units tablet Take 2,000 Units by mouth daily.          Management plans discussed with the patient and she is in agreement. Stable for discharge home  Patient should follow up with pcp  CODE STATUS:     Code Status Orders  (From admission, onward)  Start     Ordered   07/26/17 1613  Full code  Continuous     07/26/17 1612    Code Status History    Date Active Date Inactive Code Status Order ID Comments User Context   07/16/2017 2230 07/17/2017 2051 Full Code [id number]  Lance Coon, MD Inpatient   10/11/2016 1255 10/14/2016 1535 Full Code [id number]  Fritzi Mandes, MD Inpatient   10/11/2016 1255 10/11/2016 1255 Full Code [id number]  Lovell Sheehan, MD Inpatient   08/29/2016 1629 08/29/2016 2107 Full Code [id number]  Lovell Sheehan, MD Inpatient   07/23/2016 2025 07/28/2016 1757 Full Code [id number]  Hillary Bow, MD ED   06/24/2016 0710 06/25/2016 1917 Full Code [id number]  Saundra Shelling, MD Inpatient   11/14/2015 1833 11/18/2015 1851 Full  Code [id number]  Newman Pies, MD Inpatient   09/12/2015 1048 09/13/2015 0315 Full Code [id number]  Logan Bores, MD HOV    Advance Directive Documentation     Most Recent Value  Type of Advance Directive  Healthcare Power of Attorney, Living will  Pre-existing out of facility DNR order (yellow form or pink MOST form)  -  "MOST" Form in Place?  -      TOTAL TIME TAKING CARE OF THIS PATIENT: 38 minutes.    Note: This dictation was prepared with Dragon dictation along with smaller phrase technology. Any transcriptional errors that result from this process are unintentional.  Kacen Mellinger M.D on 07/27/2017 at 8:39 AM  Between 7am to 6pm - Pager - [phone] After 6pm go to [email or web address] - password [email or web address]  Office  [phone]  CC: Primary care physician; Rusty Aus, MD

## 2017-07-27 NOTE — Care Management CC44 (Signed)
Condition Code 44 Documentation Completed  Patient Details  Name: SOPHIYA MORELLO MRN: [id number] Date of Birth: 12/06/1945   Condition Code 44 given:  Yes Patient signature on Condition Code 44 notice:  Yes Documentation of 2 MD's agreement:  Yes Code 44 added to claim:  Yes    Latanya Maudlin, RN 07/27/2017, 9:19 AM

## 2017-07-27 NOTE — Progress Notes (Signed)
Cardiology Office Note  Date:  07/30/2017   ID:  [name], DOB 1945/04/30, MRN [id number]  PCP:  Rusty Aus, MD   Chief Complaint  Patient presents with  . OTHER    F/u ED chest pain and sob. Meds reviewed verbally with pt.    HPI:  72 year-old woman with  CAD,  cardiac catheterization in 2009 and June 2013, moderate LAD, diagonal and RCA disease, Obesity,  diabetes,  hyperlipidemia with statin intolerance, on praluent obstructive sleep apnea on CPAP,  Tachycardia episodes,  previous leg edema,  seen in the hospital for severe hypertension and chest pain,  EF 60% in 07/2016 who presents for routine followup of her coronary artery disease and diastolic CHF  hospitalization for acute diverticulitis July 27 2017 On [facility] records reviewed with the patient in detail cipro and flagyl, has diarrhea over the past several days  Hospitalization for chest pain July 17, 2017 Stress test showing no ischemia, ejection fraction 85% Significant failure to thrive/weakness  Legs weak, chronic back pain Walking with a cane, no regular exercise  Using lymphedema compression compression pumps 1 hour twice a day ok  Dr. Sabra Heck stopped lisinopril, off zetia  Uses her CPAP at night Periods of Extreme fatigue, unable to do anything Has neighbor that helps her, husband at home very much as he is a trucker  Chronic back pain, hip She feels some of her shortness of breath is secondary to chronic elevation of the right anterior hemidiaphragm.  This presented after shoulder surgery  Weight high, unable to lose weight through exercise No access to swimming pool  Has disability placard Has cramps in her legs and feet Taking ranexa 1/2 pill BID, denies any anginal symptoms  On praluent Total chol 181  EKG personally reviewed by myself on todays visit Shows normal sinus rhythm rate 89 bpm no significant ST or T-wave changes  Other past medical history reviewed Previous  fall left wrist fracture, required placement of plate Thin ligament injury after moving her car seat  Completed a tendon transfer  Following anesthesia developed pneumonia, longer hospital stay   Episode of pyelonephritis May 2018 Back in the hospital June 4098 with diastolic CHF, shortness of breath Also with bronchitis requiring steroids [place] the beach eating out every night, had significant weight gain and was not taking Lasix  In follow-up she is taking Lasix 20 mg daily, sometimes 40 mg for ankle swelling or weight gain  Suffered a fall resulting in Left wrist fracture, required a placement of plate Then ligamental injury after moving her car seat Reports she needs a tendon transfer  Dry weight 218 here, At home 219  Lab work reviewed with her HBa1C 6.2 Total cholesterol down to 170, previously 230s She is taking praluent  EKG personally reviewed by myself on todays visit Shows normal sinus rhythm with rate 85 bpm no significant ST or T-wave changes  Other past medical history reviewed Had back surgery 11/14/2015 Went to rehab Got postop infection, ecoli, sepsis crp 30, sed rate 130 Temp in October 2017 Drain placed 02/15/2016, rare ecoli on ABX, picc line in place Still with significant back pain No fevers,  Insulin has been increased.  Other past medical history using her CPAP. Previous blood work before praluent, total cholesterol 238, triglycerides 462, hemoglobin A1c 7.0 She was unable to tolerate Vytorin as well as other statins  Admission on October 1, discharge on November 14 2010. She ruled out by cardiac enzymes,  chest CT was negative for PE. She had a stress test that showed no ischemia. Her medications were adjusted. CT Scan did show fatty infiltration of the liver, granulomatous lesion of the left lower lobe  cardiac catheterization 07/25/2011 for chest pain  This showed left dominant coronary system with moderate mid LAD, proximal diagonal #1 and  proximal RCA disease all estimated at 50%, normal LV systolic function.  PMH:   has a past medical history of Anginal pain ([place]), Anxiety, Arthritis, CHF (congestive heart failure) ([place]), Chronic back pain, Constipation, Coronary artery disease, Depression, Diabetes mellitus, Diastolic CHF ([place]) (12/75/1700), Diverticulosis, E coli infection, GERD (gastroesophageal reflux disease), Headache, Headache (02/16/2016), Hemorrhoids, History of colon polyps, History of gout, History of hiatal hernia, History of vertigo, Hyperlipidemia, Hypertension, Hypothyroidism, Insomnia, Joint pain, Joint swelling, Muscle spasm, NSVT (nonsustained ventricular tachycardia) (HCC), OSA on CPAP, Periodic heart flutter, Peripheral edema, Peripheral vascular disease ([place]), Restless leg, Rosacea, Sleep apnea, Urinary incontinence, Urinary urgency, Varicose veins, and Weakness.  PSH:    Past Surgical History:  Procedure Laterality Date  . ABDOMINAL HYSTERECTOMY     with BSo  . CARDIAC CATHETERIZATION  2013   Normal  . CARPAL TUNNEL RELEASE Bilateral   . CATARACT EXTRACTION, BILATERAL    . CHOLECYSTECTOMY    . COLONOSCOPY    . DIAGNOSTIC LAPAROSCOPY     multiple times  . DILATION AND CURETTAGE OF UTERUS    . ESOPHAGOGASTRODUODENOSCOPY (EGD) WITH PROPOFOL N/A 11/01/2014   Procedure: ESOPHAGOGASTRODUODENOSCOPY (EGD) WITH PROPOFOL;  Surgeon: Hulen Luster, MD;  Location: [facility] ENDOSCOPY;  Service: Gastroenterology;  Laterality: N/A;  . HARDWARE REMOVAL Left 10/11/2016   Procedure: HARDWARE REMOVAL-LEFT RADIUS;  Surgeon: Lovell Sheehan, MD;  Location: [facility] ORS;  Service: Orthopedics;  Laterality: Left;  Left Radius Wrist   . IMPLANTABLE CONTACT LENS IMPLANTATION     bilateral  . LAMINECTOMY  11/13/2015  . LUMBAR FUSION  11/2015  . LUMBAR WOUND DEBRIDEMENT N/A 12/02/2015   Procedure: WOUND Exploration;  Surgeon: Consuella Lose, MD;  Location: [facility];  Service: Neurosurgery;  Laterality: N/A;  . OPEN REDUCTION INTERNAL FIXATION  (ORIF) DISTAL RADIAL FRACTURE Left 08/29/2016   Procedure: OPEN REDUCTION INTERNAL FIXATION (ORIF) DISTAL RADIAL FRACTURE;  Surgeon: Lovell Sheehan, MD;  Location: [facility] ORS;  Service: Orthopedics;  Laterality: Left;  . PICC LINE PLACE PERIPHERAL ([place] HX)     right upper arm   . ROTATOR CUFF REPAIR Left   . SAVORY DILATION N/A 11/01/2014   Procedure: SAVORY DILATION;  Surgeon: Hulen Luster, MD;  Location: [facility] ENDOSCOPY;  Service: Gastroenterology;  Laterality: N/A;  . TONSILLECTOMY    . TRIGGER FINGER RELEASE Bilateral     Current Outpatient Medications  Medication Sig Dispense Refill  . albuterol (PROVENTIL HFA;VENTOLIN HFA) 108 (90 Base) MCG/ACT inhaler Inhale 2 puffs into the lungs every 6 (six) hours as needed for wheezing or shortness of breath. 3 Inhaler 3  . Alirocumab (PRALUENT) 150 MG/ML SOPN Inject 150 mg into the skin every 14 (fourteen) days.    [name] aspirin 81 MG tablet Take 81 mg by mouth at bedtime.     [name] CARAFATE 1 GM/10ML suspension Take 10 mLs by mouth QID.    [name] Cholecalciferol (VITAMIN D) 2000 units tablet Take 2,000 Units by mouth daily.    . ciprofloxacin (CIPRO) 500 MG tablet Take 1 tablet (500 mg total) by mouth 2 (two) times daily for 8 days. 16 tablet 0  . desonide (DESOWEN) 0.05 % lotion Apply 1  application topically daily.    [name] esomeprazole (NEXIUM) 40 MG capsule Take 40 mg by mouth 2 (two) times daily before a meal.     . hyoscyamine (LEVSIN SL) 0.125 MG SL tablet Place 2 tablets (0.25 mg total) under the tongue every 4 (four) hours as needed for cramping. 30 tablet 0  . insulin aspart (NOVOLOG) 100 UNIT/ML FlexPen Inject 10-16 Units into the skin See admin instructions. 10 units with breakfast, 10 units with lunch, 16 units at dinner, >200 increase by 2 units.    . insulin glargine (LANTUS) 100 UNIT/ML injection Inject 32 Units into the skin daily.     [name] ipratropium-albuterol (DUONEB) 0.5-2.5 (3) MG/3ML SOLN Take 3 mLs by nebulization every 6 (six) hours as needed.     [name] levothyroxine (SYNTHROID, LEVOTHROID) 75 MCG tablet Take 75 mcg by mouth daily before breakfast.     . metoprolol succinate (TOPROL-XL) 25 MG 24 hr tablet Take 1 tablet (25 mg total) by mouth 2 (two) times daily. 180 tablet 3  . metroNIDAZOLE (FLAGYL) 500 MG tablet Take 1 tablet (500 mg total) by mouth every 8 (eight) hours for 8 days. 24 tablet 0  . nitroGLYCERIN (NITROSTAT) 0.4 MG SL tablet Place 1 tablet (0.4 mg total) under the tongue every 5 (five) minutes as needed for chest pain. 25 tablet 3  . ondansetron (ZOFRAN) 4 MG tablet Take 1 tablet by mouth every 8 (eight) hours as needed.    . potassium chloride SA (K-DUR,KLOR-CON) 20 MEQ tablet Take 1 tablet (20 mEq total) by mouth daily. 90 tablet 3  . ranolazine (RANEXA) 1000 MG SR tablet Take 1 tablet (1,000 mg total) by mouth 2 (two) times daily. 180 tablet 3  . ropinirole (REQUIP) 5 MG tablet Take 5 mg by mouth 2 (two) times daily. Takes in afternoon and evening    . Saccharomyces boulardii (PROBIOTIC) 250 MG CAPS Take 250 mg by mouth 2 (two) times daily for 11 days. 24 capsule 0  . temazepam (RESTORIL) 30 MG capsule Take 30 mg by mouth at bedtime.    . torsemide (DEMADEX) 20 MG tablet Take 1 tablet (20 mg total) by mouth daily. Take an extra 20mg  in the afternoon if weight gain or increased fluid retention noted. 90 tablet 3   No current facility-administered medications for this visit.    Facility-Administered Medications Ordered in Other Visits  Medication Dose Route Frequency Provider Last Rate Last Dose  . dexamethasone (DECADRON) injection 10 mg  10 mg Intravenous Once Lovell Sheehan, MD         Allergies:   Reglan [metoclopramide]; Zetia [ezetimibe]; Ceftin [cefuroxime axetil]; Codeine; Penicillins; and Vicodin [hydrocodone-acetaminophen]   Social History:  The patient  reports that she has never smoked. She has never used smokeless tobacco. She reports that she does not drink alcohol or use drugs.   Family History:   family  history includes Breast cancer (age of onset: 46) in her mother; Heart attack in her sister; Heart disease in her brother, father, mother, and sister.    Review of Systems: Review of Systems  Constitutional: Positive for malaise/fatigue.       Weight gain  Respiratory: Negative.   Cardiovascular: Negative.   Gastrointestinal: Negative.   Musculoskeletal: Positive for joint pain.       Leg weakness  Neurological: Negative.   Psychiatric/Behavioral: Negative.   All other systems reviewed and are negative.    PHYSICAL EXAM: VS:  BP 130/76 (BP Location: Left Arm, Patient Position:  Sitting, Cuff Size: Large)   Pulse 89   Ht 5\' 1"  (1.549 m)   Wt 223 lb (101.2 kg)   BMI 42.14 kg/m  , BMI Body mass index is 42.14 kg/m. Constitutional:  oriented to person, place, and time. No distress. Morbidly obese HENT:  Head: Normocephalic and atraumatic.  Eyes:  no discharge. No scleral icterus.  Neck: Normal range of motion. Neck supple. No JVD present.  Cardiovascular: Normal rate, regular rhythm, normal heart sounds and intact distal pulses. Exam reveals no gallop and no friction rub. No edema No murmur heard. Pulmonary/Chest: Effort normal and breath sounds normal. No stridor. No respiratory distress.  no wheezes.  no rales.  no tenderness.  Abdominal: Soft.  no distension.  no tenderness.  Musculoskeletal: Normal range of motion.  no  tenderness or deformity.  Neurological:  normal muscle tone. Coordination normal. No atrophy Skin: Skin is warm and dry. No rash noted. not diaphoretic.  Psychiatric:  normal mood and affect. behavior is normal. Thought content normal.    Recent Labs: 07/17/2017: B Natriuretic Peptide 29.0 07/27/2017: ALT 15; BUN 9; Creatinine, Ser 0.93; Hemoglobin 11.5; Platelets 259; Potassium 3.7; Sodium 140    Lipid Panel Lab Results  Component Value Date   CHOL 163 07/26/2015   HDL 32 (L) 07/26/2015   LDLCALC 94 07/26/2015   TRIG 187 (H) 07/26/2015      Wt  Readings from Last 3 Encounters:  07/30/17 223 lb (101.2 kg)  07/26/17 224 lb (101.6 kg)  07/16/17 224 lb 8 oz (101.8 kg)      ASSESSMENT AND PLAN:  Coronary artery disease involving native coronary artery of native heart without angina pectoris - Plan: EKG 12-Lead Continue current medication regiment ACE inhibitor held by primary care presumably for low blood pressure  Essential hypertension Blood pressure is well controlled on today's visit. No changes made to the medications.  Mixed hyperlipidemia On praluent 150 mg, every two weeks,  Zetia 10 mg daily  Tachycardia Denies having tachycardia Continue metoprolol succinate 25 twice a day  Chronic diastolic CHF  Leg swelling improved with lymphedema compression pumps Stable swelling on torsemide 20 daily, extra torsemide as needed for worsening leg swelling  Type 2 diabetes mellitus with other circulatory complication, with long-term current use of insulin (HCC) Unable to lose weight, chronic pain, deconditioning Previously declined pulmonary rehabilitation Does not have access to swimming pool for low impact exercise  OSA on CPAP Tolerated CPAP   Total encounter time more than 25 minutes  Greater than 50% was spent in counseling and coordination of care with the patient   Disposition:   F/U  12 months   Orders Placed This Encounter  Procedures  . EKG 12-Lead     Signed, Esmond Plants, M.D., Ph.D. 07/30/2017  [place], [place]

## 2017-07-27 NOTE — Progress Notes (Signed)
D/w patient rash with PCN will try augmentin while in hospital and observe

## 2017-07-28 LAB — HEMOGLOBIN A1C
HEMOGLOBIN A1C: 5.8 % — AB (ref 4.8–5.6)
MEAN PLASMA GLUCOSE: 119.76 mg/dL

## 2017-07-30 ENCOUNTER — Ambulatory Visit (INDEPENDENT_AMBULATORY_CARE_PROVIDER_SITE_OTHER): Payer: Medicare Other | Admitting: Cardiovascular Disease

## 2017-07-30 ENCOUNTER — Encounter: Payer: Self-pay | Admitting: Cardiovascular Disease

## 2017-07-30 VITALS — BP 130/76 | HR 89 | Ht 61.0 in | Wt 223.0 lb

## 2017-07-30 DIAGNOSIS — Z9989 Dependence on other enabling machines and devices: Secondary | ICD-10-CM | POA: Diagnosis not present

## 2017-07-30 DIAGNOSIS — I25118 Atherosclerotic heart disease of native coronary artery with other forms of angina pectoris: Secondary | ICD-10-CM

## 2017-07-30 DIAGNOSIS — N183 Chronic kidney disease, stage 3 unspecified: Secondary | ICD-10-CM

## 2017-07-30 DIAGNOSIS — R072 Precordial pain: Secondary | ICD-10-CM

## 2017-07-30 DIAGNOSIS — R600 Localized edema: Secondary | ICD-10-CM | POA: Diagnosis not present

## 2017-07-30 DIAGNOSIS — Z794 Long term (current) use of insulin: Secondary | ICD-10-CM | POA: Diagnosis not present

## 2017-07-30 DIAGNOSIS — I200 Unstable angina: Secondary | ICD-10-CM

## 2017-07-30 DIAGNOSIS — I10 Essential (primary) hypertension: Secondary | ICD-10-CM

## 2017-07-30 DIAGNOSIS — G4733 Obstructive sleep apnea (adult) (pediatric): Secondary | ICD-10-CM | POA: Diagnosis not present

## 2017-07-30 DIAGNOSIS — I5032 Chronic diastolic (congestive) heart failure: Secondary | ICD-10-CM

## 2017-07-30 DIAGNOSIS — E1122 Type 2 diabetes mellitus with diabetic chronic kidney disease: Secondary | ICD-10-CM | POA: Diagnosis not present

## 2017-07-30 NOTE — Patient Instructions (Signed)

## 2017-08-02 ENCOUNTER — Telehealth: Payer: Self-pay | Admitting: Cardiovascular Disease

## 2017-08-02 NOTE — Telephone Encounter (Signed)
She does take naps during the day, has diagnosis of sleep apnea, and wears her CPAP machine. She denies any dizziness and is feeling good.

## 2017-08-02 NOTE — Telephone Encounter (Signed)
Call transferred directly to triage. 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  She wore the monitor from 6/3-6/17. Monitor has been uploaded to the [employer] website to be reviewed.  Dr. Donivan Scull nurse notified and she has printed strips for MD to review.  She will call the patient to follow up.

## 2017-08-02 NOTE — Telephone Encounter (Signed)
Spoke with patient to see if her monitor had come off at anytime while wearing. She states that it stayed on the full time and at no time did it come off. Advised that report is being reviewed by provider and we will be in touch with her results. She verbalized understanding with no further questions at this time.

## 2017-08-02 NOTE — Telephone Encounter (Signed)
Spoke with patient and reviewed results and recommendations. She again confirmed that she wears her CPAP for both naps and sleep at night. She denies any symptoms at this time and was agreeable to come in next week to see Dr. Caryl Comes. Reviewed signs and symptoms to monitor for that would require immediate evaluation in the ED. She verbalized understanding of our conversation, agreement with plan, and had no further questions at this time.

## 2017-08-08 ENCOUNTER — Encounter: Payer: Self-pay | Admitting: Internal Medicine

## 2017-08-08 ENCOUNTER — Ambulatory Visit (INDEPENDENT_AMBULATORY_CARE_PROVIDER_SITE_OTHER): Payer: Medicare Other | Admitting: Internal Medicine

## 2017-08-08 VITALS — BP 144/62 | HR 99 | Ht 61.0 in | Wt 219.2 lb

## 2017-08-08 DIAGNOSIS — I2510 Atherosclerotic heart disease of native coronary artery without angina pectoris: Secondary | ICD-10-CM | POA: Diagnosis not present

## 2017-08-08 DIAGNOSIS — I441 Atrioventricular block, second degree: Secondary | ICD-10-CM

## 2017-08-08 MED ORDER — NEBIVOLOL HCL 5 MG PO TABS: 5.0000 mg | ORAL_TABLET | Freq: Every day | ORAL | 0 refills | Status: DC

## 2017-08-08 MED ORDER — BISOPROLOL FUMARATE 5 MG PO TABS: 5.0000 mg | ORAL_TABLET | Freq: Every day | ORAL | 0 refills | Status: DC

## 2017-08-08 MED ORDER — ATENOLOL 50 MG PO TABS: 50.0000 mg | ORAL_TABLET | Freq: Two times a day (BID) | ORAL | 0 refills | Status: DC

## 2017-08-08 NOTE — Patient Instructions (Addendum)
Medication Instructions: - Your physician has recommended you make the following change in your medication:   1) STOP metoprolol succinate  2) You are being prescriptions for 3 different beta blockers to try. You may take them in any order, however, DO NOT take more than 1 at a time. Try to give at least 2 weeks on each one to let your body adjust to it. If you find one that you like, then let us know and we will send in additional refills for you.  - atenolol 50 mg - take 1 tablet by mouth once daily - bisoprolol 5 mg- take 1 tablet by mouth once daily - nebivolol 5 mg- take 1 tablet by mouth once daily (try this one last due to cost)  Labwork: - none ordered  Procedures/Testing: - Your physician has recommended that you have a Cardiac CT done  ( You will receive a call from our scheduler in [place] to set this up. It may take up to 2-3 weeks to receive a call to schedule. If you do not hear from a scheduler after 3 weeks, please call the office here and let us know- [phone])  Please arrive at the [facility] main entrance of [facility] at _______________________AM (30-45 minutes prior to test start time)  [facility] [place], [place] [address] [phone]  Proceed to the [facility] Radiology Department (First Floor).  Please follow these instructions carefully (unless otherwise directed):  Hold all erectile dysfunction medications at least 48 hours prior to test.  On the Night Before the Test: . Drink plenty of water. . Do not consume any caffeinated/decaffeinated beverages or chocolate 12 hours prior to your test. . Do not take any antihistamines 12 hours prior to your test. . If you take Metformin do not take 24 hours prior to test.   On the Day of the Test: . Drink plenty of water. Do not drink any water within one hour of the test. . Do not eat any food 4 hours prior to the test. . You may take your regular medications  prior to the test. . Make sure to take your BETA BLOCKER medication this morning . HOLD torsemide morning of the test.  After the Test: . Drink plenty of water. . After receiving IV contrast, you may experience a mild flushed feeling. This is normal. . On occasion, you may experience a mild rash up to 24 hours after the test. This is not dangerous. If this occurs, you can take Benadryl 25 mg and increase your fluid intake. . If you experience trouble breathing, this can be serious. If it is severe call 911 IMMEDIATELY. If it is mild, please call our office. . If you take any of these medications: Glipizide/Metformin, Avandament, Glucavance, please do not take 48 hours after completing test.   Follow-Up: - Dr. Caryl Comes will see you back on an as needed basis.  - please follow up with Dr. Vella Kohler regarding your C-PAP  Any Additional Special Instructions Will Be Listed Below (If Applicable).     If you need a refill on your cardiac medications before your next appointment, please call your pharmacy.

## 2017-08-08 NOTE — Progress Notes (Signed)
ELECTROPHYSIOLOGY CONSULT NOTE  Patient ID: [name], MRN: [id number], DOB/AGE: 72-Feb-1947 72 y.o. Admit date: (Not on file) Date of Consult: 08/08/2017  Primary Physician: Ana Aus, Castaneda Primary Cardiologist: [name] is a 72 y.o. female who is being seen today for the evaluation of syncope  at the request of [name].    HPI [name] is a 72 y.o. female referred for episodes of lightheadedness and dyspnea.  She has noted increasing problems with exertional lightheadedness in fact had syncope while walking back from the beach a few months ago.  These have been associated with tachypalpitations.  Because of this she was given prolonged monitor demonstrating pauses of up to 8 seconds and episodes of sinus tachycardia.  She has known sleep apnea and is treated but it has been a long time since she has seen her doctor.  She also had episodes of sinus rates in the 160-180 range.  She also has dizziness upon standing.  She has never been diagnosed as having orthostatic hypotension    She has noted progressive functional deterioration over the last 2 years.  She has exetional chest pain and has been treated with ranolazine though no dx of obstructive CAD   She has been diagnosed with HFpEF and has been treated with diuretics.  She has had long-standing hypertension.  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  A slip test was -9/18   She describes a remote catheterization with "50% lesions "  DATE TEST EF   6/18 Echo   55-65 % RV upper limit no TR jet   6/19 Echo   55-65 % RVE and nl PA pressure reported but there is no TR jet described  6/19 Myoview  85% No ischemia     Past Medical History:  Diagnosis Date  . Anginal pain ([place])   . Anxiety   . Arthritis   . CHF (congestive heart failure) ([place])   . Chronic back pain    stenosis.degenerative disc,some scoliosis  . Constipation    takes Stool  Softener daily  . Coronary artery disease   . Depression    takes Cymbalta daily  . Diabetes mellitus    Type 2 diabetic. Average fasting blood sugar runs high 170-200  . Diastolic CHF ([place]) 53/66/4403   Per patient, diagnosed in 2018  . Diverticulosis   . E coli infection   . GERD (gastroesophageal reflux disease)    takes Nexium daily  . Headache   . Headache 02/16/2016  . Hemorrhoids   . History of colon polyps    benign  . History of gout    doesn't take any meds  . History of hiatal hernia   . History of vertigo    doesn't take any meds  . Hyperlipidemia    takes Praluent daily  . Hypertension    currently BP medications are on hold   . Hypothyroidism    takes Synthroid daily  . Insomnia    takes Restoril nightly  . Joint pain   . Joint swelling   . Muscle spasm    takes Robaxin as needed  . NSVT (nonsustained ventricular tachycardia) ([place])   . OSA on CPAP   . Periodic heart flutter   . Peripheral edema    takes LAsix as needed  . Peripheral vascular disease ([place])    AAA as stated per pt / was just discovered  and pt states has not been referred to vascular Castaneda   . Restless leg    takes Requip daily  . Rosacea   . Sleep apnea   . Urinary incontinence   . Urinary urgency   . Varicose veins   . Weakness    numbness and tingling.      Surgical History:  Past Surgical History:  Procedure Laterality Date  . ABDOMINAL HYSTERECTOMY     with BSo  . CARDIAC CATHETERIZATION  2013   Normal  . CARPAL TUNNEL RELEASE Bilateral   . CATARACT EXTRACTION, BILATERAL    . CHOLECYSTECTOMY    . COLONOSCOPY    . DIAGNOSTIC LAPAROSCOPY     multiple times  . DILATION AND CURETTAGE OF UTERUS    . ESOPHAGOGASTRODUODENOSCOPY (EGD) WITH PROPOFOL N/A 11/01/2014   Procedure: ESOPHAGOGASTRODUODENOSCOPY (EGD) WITH PROPOFOL;  Surgeon: Ana Luster, Castaneda;  Location: [facility] ENDOSCOPY;  Service: Gastroenterology;  Laterality: N/A;  . HARDWARE REMOVAL Left 10/11/2016   Procedure: HARDWARE  REMOVAL-LEFT RADIUS;  Surgeon: Ana Sheehan, Castaneda;  Location: [facility] ORS;  Service: Orthopedics;  Laterality: Left;  Left Radius Wrist   . IMPLANTABLE CONTACT LENS IMPLANTATION     bilateral  . LAMINECTOMY  11/13/2015  . LUMBAR FUSION  11/2015  . LUMBAR WOUND DEBRIDEMENT N/A 12/02/2015   Procedure: WOUND Exploration;  Surgeon: Ana Lose, Castaneda;  Location: [place];  Service: Neurosurgery;  Laterality: N/A;  . OPEN REDUCTION INTERNAL FIXATION (ORIF) DISTAL RADIAL FRACTURE Left 08/29/2016   Procedure: OPEN REDUCTION INTERNAL FIXATION (ORIF) DISTAL RADIAL FRACTURE;  Surgeon: Ana Sheehan, Castaneda;  Location: [facility] ORS;  Service: Orthopedics;  Laterality: Left;  . PICC LINE PLACE PERIPHERAL ([place] HX)     right upper arm   . ROTATOR CUFF REPAIR Left   . SAVORY DILATION N/A 11/01/2014   Procedure: SAVORY DILATION;  Surgeon: Ana Luster, Castaneda;  Location: [facility] ENDOSCOPY;  Service: Gastroenterology;  Laterality: N/A;  . TONSILLECTOMY    . TRIGGER FINGER RELEASE Bilateral      Home Meds: Prior to Admission medications   Medication Sig Start Date End Date Taking? Authorizing Provider  albuterol (PROVENTIL HFA;VENTOLIN HFA) 108 (90 Base) MCG/ACT inhaler Inhale 2 puffs into the lungs every 6 (six) hours as needed for wheezing or shortness of breath. 01/09/17   [name]  Alirocumab (PRALUENT) 150 MG/ML SOPN Inject 150 mg into the skin every 14 (fourteen) days. 07/27/17   [name]  aspirin 81 MG tablet Take 81 mg by mouth at bedtime.     Provider, Historical, Castaneda  CARAFATE 1 GM/10ML suspension Take 10 mLs by mouth QID. 07/19/17   Provider, Historical, Castaneda  Cholecalciferol (VITAMIN D) 2000 units tablet Take 2,000 Units by mouth daily.    Provider, Historical, Castaneda  desonide (DESOWEN) 0.05 % lotion Apply 1 application topically daily.    Provider, Historical, Castaneda  esomeprazole (NEXIUM) 40 MG capsule Take 40 mg by mouth 2 (two) times daily before a meal.     Provider, Historical, Castaneda  hyoscyamine (LEVSIN  SL) 0.125 MG SL tablet Place 2 tablets (0.25 mg total) under the tongue every 4 (four) hours as needed for cramping. 06/25/16   Ana Grist, Castaneda  insulin aspart (NOVOLOG) 100 UNIT/ML FlexPen Inject 10-16 Units into the skin See admin instructions. 10 units with breakfast, 10 units with lunch, 16 units at dinner, >200 increase by 2 units. 03/01/15 07/30/17  Provider, Historical, Castaneda  insulin glargine (LANTUS) 100 UNIT/ML injection Inject  32 Units into the skin daily.     Provider, Historical, Castaneda  ipratropium-albuterol (DUONEB) 0.5-2.5 (3) MG/3ML SOLN Take 3 mLs by nebulization every 6 (six) hours as needed. 07/27/17   [name]  levothyroxine (SYNTHROID, LEVOTHROID) 75 MCG tablet Take 75 mcg by mouth daily before breakfast.  11/24/12   Provider, Historical, Castaneda  metoprolol succinate (TOPROL-XL) 25 MG 24 hr tablet Take 1 tablet (25 mg total) by mouth 2 (two) times daily. 06/13/17   Minna Merritts, Castaneda  nitroGLYCERIN (NITROSTAT) 0.4 MG SL tablet Place 1 tablet (0.4 mg total) under the tongue every 5 (five) minutes as needed for chest pain. 03/06/16 09/18/17  Minna Merritts, Castaneda  ondansetron (ZOFRAN) 4 MG tablet Take 1 tablet by mouth every 8 (eight) hours as needed. 07/19/17   Provider, Historical, Castaneda  potassium chloride SA (K-DUR,KLOR-CON) 20 MEQ tablet Take 1 tablet (20 mEq total) by mouth daily. 06/13/17   Minna Merritts, Castaneda  ranolazine (RANEXA) 1000 MG SR tablet Take 1 tablet (1,000 mg total) by mouth 2 (two) times daily. 06/13/17   Minna Merritts, Castaneda  ropinirole (REQUIP) 5 MG tablet Take 5 mg by mouth 2 (two) times daily. Takes in afternoon and evening    Provider, Historical, Castaneda  temazepam (RESTORIL) 30 MG capsule Take 30 mg by mouth at bedtime.    Provider, Historical, Castaneda  torsemide (DEMADEX) 20 MG tablet Take 1 tablet (20 mg total) by mouth daily. Take an extra 20mg  in the afternoon if weight gain or increased fluid retention noted. 07/17/17   Gladstone Lighter, Castaneda    Allergies:  Allergies    Allergen Reactions  . Reglan [Metoclopramide]   . Zetia [Ezetimibe]     Diarrhea   . Ceftin [Cefuroxime Axetil] Diarrhea  . Codeine Rash       . Penicillins Rash    Has patient had a PCN reaction causing immediate rash, facial/tongue/throat swelling, SOB or lightheadedness with hypotension: NO Has patient had a PCN reaction causing severe rash involving mucus membranes or skin necrosis: NO Has patient had a PCN retioion that required hospitalization NO Has patient had a PCN reaction occurring within the last 10 years: NO If all of the above answers are "NO", then may proceed with Cephalosporin use.  . Vicodin [Hydrocodone-Acetaminophen] Other (See Comments)    passes out    Social History   Socioeconomic History  . Marital status: Married    Spouse name: Not on file  . Number of children: Not on file  . Years of education: Not on file  . Highest education level: Not on file  Occupational History  . Occupation: retired  Scientific laboratory technician  . Financial resource strain: Not on file  . Food insecurity:    Worry: Not on file    Inability: Not on file  . Transportation needs:    Medical: Not on file    Non-medical: Not on file  Tobacco Use  . Smoking status: Never Smoker  . Smokeless tobacco: Never Used  Substance and Sexual Activity  . Alcohol use: No  . Drug use: No  . Sexual activity: Not on file  Lifestyle  . Physical activity:    Days per week: Not on file    Minutes per session: Not on file  . Stress: Not on file  Relationships  . Social connections:    Talks on phone: Not on file    Gets together: Not on file    Attends religious service: Not  on file    Active member of club or organization: Not on file    Attends meetings of clubs or organizations: Not on file    Relationship status: Not on file  . Intimate partner violence:    Fear of current or ex partner: Not on file    Emotionally abused: Not on file    Physically abused: Not on file    Forced sexual  activity: Not on file  Other Topics Concern  . Not on file  Social History Narrative  . Not on file     Family History  Problem Relation Age of Onset  . Heart disease Mother   . Breast cancer Mother 81  . Heart disease Father   . Heart attack Sister   . Heart disease Sister   . Heart disease Brother      ROS:  Please see the history of present illness.     All other systems reviewed and negative.    Physical Exam: Blood pressure (!) 144/62, pulse 99, height 5\' 1"  (1.549 m), weight 219 lb 4 oz (99.5 kg). General: Well developed, well nourished female in no acute distress. Head: Normocephalic, atraumatic, sclera non-icteric, no xanthomas, nares are without discharge. EENT: normal  Lymph Nodes:  none Neck: Negative for carotid bruits. JVD 8-10 Back:without scoliosis kyphosis Lungs: Clear bilaterally to auscultation without wheezes, rales, or rhonchi. Breathing is unlabored. Heart: RRR with S1 S2. No  murmur . No rubs, or gallops appreciated. Abdomen: Soft, non-tender, non-distended with normoactive bowel sounds. No hepatomegaly. No rebound/guarding. No obvious abdominal masses. Msk:  Strength and tone appear normal for age. Extremities: No clubbing or cyanosis. 1-2+  edema.  Distal pedal pulses are 2+ and equal bilaterally. Skin: Warm and Dry Neuro: Alert and oriented X 3. CN III-XII intact Grossly normal sensory and motor function . Psych:  Responds to questions appropriately with a normal affect.      Labs: Cardiac Enzymes No results for input(s): CKTOTAL, CKMB, TROPONINI in the last 72 hours. CBC Lab Results  Component Value Date   WBC 5.8 07/27/2017   HGB 11.5 (L) 07/27/2017   HCT 34.3 (L) 07/27/2017   MCV 91.2 07/27/2017   PLT 259 07/27/2017   PROTIME: No results for input(s): LABPROT, INR in the last 72 hours. Chemistry No results for input(s): NA, K, CL, CO2, BUN, CREATININE, CALCIUM, PROT, BILITOT, ALKPHOS, ALT, AST, GLUCOSE in the last 168 hours.  Invalid  input(s): LABALBU Lipids Lab Results  Component Value Date   CHOL 163 07/26/2015   HDL 32 (L) 07/26/2015   LDLCALC 94 07/26/2015   TRIG 187 (H) 07/26/2015   BNP No results found for: PROBNP Thyroid Function Tests: No results for input(s): TSH, T4TOTAL, T3FREE, THYROIDAB in the last 72 hours.  Invalid input(s): FREET3 Miscellaneous No results found for: DDIMER  Radiology/Studies:  Ct Abdomen Pelvis W Contrast  Result Date: 07/26/2017 CLINICAL DATA:  Severe abdominal pain for 1 day. Pain mostly in the left lower quadrant. Hospitalized for chest pain last week. EXAM: CT ABDOMEN AND PELVIS WITH CONTRAST TECHNIQUE: Multidetector CT imaging of the abdomen and pelvis was performed using the standard protocol following bolus administration of intravenous contrast. CONTRAST:  122mL ISOVUE-300 IOPAMIDOL (ISOVUE-300) INJECTION 61% COMPARISON:  Abdominopelvic CT 07/23/2017 and 06/24/2016. FINDINGS: Lower chest: There is a small calcified left lower lobe granuloma. The lung bases are otherwise clear. There is no pleural or pericardial effusion. Atherosclerosis of the aorta and coronary arteries noted. Hepatobiliary: There is stable focal  fat adjacent to the falciform ligament (image 17/2). No suspicious hepatic findings. There is stable mild extrahepatic biliary dilatation status post cholecystectomy. Pancreas: Unremarkable. No pancreatic ductal dilatation or surrounding inflammatory changes. Spleen: Normal in size without focal abnormality. Adrenals/Urinary Tract: Both adrenal glands appear normal. The kidneys appear normal without evidence of urinary tract calculus, suspicious lesion or hydronephrosis. No bladder abnormalities are seen. Stomach/Bowel: There is a stable small hiatal hernia. The stomach is decompressed. The small bowel, appendix and proximal colon appear normal. Diverticular changes are present throughout the descending and sigmoid colon. There is mild proximal sigmoid colon wall thickening  and surrounding inflammation compatible with mild diverticulitis. No evidence of bowel obstruction, perforation or abscess. Vascular/Lymphatic: There are no enlarged abdominal or pelvic lymph nodes. Mild aortic and branch vessel atherosclerosis. No acute vascular findings. Reproductive: Hysterectomy.  No adnexal mass. Other: Stable small umbilical hernia containing only fat. No free air or ascites. Musculoskeletal: No acute osseous findings. There is solid interbody fusion status post L3 through S1 interbody fusion. 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 IMPRESSION: 1. Findings are compatible with mild sigmoid diverticulitis. No evidence of perforation, abscess or obstruction. 2. The appendix appears normal. 3. Stable mild extrahepatic biliary dilatation post cholecystectomy. Small hiatal hernia and umbilical hernia containing only fat. Aortic Atherosclerosis (ICD10-I70.0). 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 Electronically Signed   By: Richardean Sale M.D.   On: 07/26/2017 14:08   Ct Abdomen Pelvis W Contrast  Result Date: 07/23/2017 CLINICAL DATA:  Generalized abdominal pain. EXAM: CT ABDOMEN AND PELVIS WITH CONTRAST TECHNIQUE: Multidetector CT imaging of the abdomen and pelvis was performed using the standard protocol following bolus administration of intravenous contrast. CONTRAST:  59mL ISOVUE-300 IOPAMIDOL (ISOVUE-300) INJECTION 61% COMPARISON:  06/24/2016 FINDINGS: Lower chest: Calcified granuloma identified in the left lung base. No acute abnormality identified. Hepatobiliary: No focal liver abnormality. Previous cholecystectomy. No biliary dilatation. Pancreas: Unremarkable. No pancreatic ductal dilatation or surrounding inflammatory changes. Spleen: Normal in size without focal abnormality. Adrenals/Urinary Tract: The adrenal glands appear normal. Unremarkable appearance of both  kidneys. No mass or hydronephrosis. The urinary bladder appears normal. Stomach/Bowel: Small hiatal hernia. The appendix is visualized and appears normal. Numerous colonic diverticula identified without acute inflammation. Vascular/Lymphatic: Aortic atherosclerosis. No aneurysm. No abdominopelvic adenopathy identified. Reproductive: Status post hysterectomy. No adnexal masses. Other: There is a fat containing periumbilical hernia. No free fluid or fluid collections identified. Musculoskeletal: Previous hardware fusion of L3 through S1. A retrolisthesis of L2 on L3 with marked L2-3 degenerative disc disease noted. IMPRESSION: 1. No acute findings identified within the abdomen or pelvis. 2.  Aortic Atherosclerosis (ICD10-I70.0). 3. Status post posterior lumbar fusion. Advanced degenerative disc disease with retrolisthesis noted at L2-3. Electronically Signed   By: Kerby Moors M.D.   On: 07/23/2017 12:01   Nm Myocar Multi W/spect W/wall Motion / Ef  Result Date: 07/17/2017 Pharmacological myocardial perfusion imaging study with no significant  ischemia Normal wall motion, EF estimated at 85% No EKG changes concerning for ischemia at peak stress or in recovery. Low risk scan Signed, Esmond Plants, Castaneda, Ph.D [facility]   Dg Chest Portable 1 View  Result Date: 07/16/2017 CLINICAL DATA:  Acute onset of central chest pain radiating to left arm approximately 3 hours ago. Nausea. EXAM: PORTABLE CHEST 1 VIEW COMPARISON:  10/14/2016 FINDINGS: The heart size and mediastinal contours are within normal limits. Both lungs are clear.  Holter monitor seen overlying the left hemithorax. The visualized skeletal structures are unremarkable. IMPRESSION: No active disease. Electronically Signed   By: Earle Gell M.D.   On: 07/16/2017 19:24    EKG: Sinus @ 99  Event recorder personally reviewed numerous prolonged pauses but no associated syncope.  Occurring probably in the context of sleep.  Syncope occurred without  demonstrable arrhythmia apart from sinus tachycardia.  Nonsustained atrial tachycardia also noted Assessment and Plan:  Syncope and exertional lightheadedness  Atrial tachycardia-nonsustained  Obstructive sleep apnea treated  Nocturnal pausing up to 8 seconds  Exertional syncope and lightheadedness  Obesity  Diabetes mellitus  Diastolic heart failure  Exertional dyspnea and chest discomfort    Her nocturnal pauses suggest that her CPAP is not ideally titrated.  I have asked her to follow-up with Dr. Raul Del for reevaluation.  They are not in themselves and indication for pacing.  She has resting sinus tachycardia with daytime average heart rate just above 100.  TSH 2/19 and CBC 6/19 ([place]) are normal.  Cardiovascular stress and her significant I wonder whether as not driving it as her nocturnal rates are quite normal.  We will try her on the treatment of beta-blockers that she is not been able to tolerate higher doses of metoprolol succinate.  We have given her a prescription for atenolol, nebivolol and Bystolic.  This may also help her with her tachypalpitations which are associated with atrial tachycardia  Her exertional symptoms are concerning.  She has a history of coronary disease by remote catheterization and her pretest probability is sufficiently high that I am concerned about the potential false negativity of nuclear stress imaging.  I have recommended CTA.  I am also concerned about pulmonary hypertension.  She has a loud P2.  Reviewing her echo reports the last couple of years no tricuspid regurgitation jet was noted and so no ability to estimate pulmonary pressures as possible.  Her right ventricular dimensions have enlarged significantly.  I do not know how accurate these numbers are; I reached out to Dr. Reine Just to consider how best to reevaluate, intact whether it is necessary in the absence of TR, to worry about significant pulmonary hypertension as a contributor to her  symptoms.  Dizziness today with standing in the context of orthostatic hypertension is something seen in diabetics.  No good therapies are reported of which I am aware   Virl Axe

## 2017-08-21 ENCOUNTER — Other Ambulatory Visit: Payer: Self-pay | Admitting: *Deleted

## 2017-08-21 MED ORDER — ATENOLOL 50 MG PO TABS: 50.0000 mg | ORAL_TABLET | Freq: Every day | ORAL | 1 refills | Status: DC

## 2017-08-22 ENCOUNTER — Telehealth: Payer: Self-pay | Admitting: Internal Medicine

## 2017-08-22 NOTE — Telephone Encounter (Signed)
Staff message sent to [name] to follow up on where the patient is in the process of being scheduled for her Cardiac CTA.

## 2017-08-22 NOTE — Telephone Encounter (Signed)
Pt is calling to see the status of her CTA being scheduled. Please call and advise.

## 2017-09-03 NOTE — Telephone Encounter (Signed)
Staff message received from Mack Guise that the patient is scheduled for her Cardiac CT on 09/06/17.

## 2017-09-06 ENCOUNTER — Ambulatory Visit (HOSPITAL_COMMUNITY)
Admission: RE | Admit: 2017-09-06 | Discharge: 2017-09-06 | Disposition: A | Discharge status: Home / Self Care | Payer: Medicare Other | Source: Ambulatory Visit | Attending: Internal Medicine | Admitting: Internal Medicine

## 2017-09-06 DIAGNOSIS — K449 Diaphragmatic hernia without obstruction or gangrene: Secondary | ICD-10-CM | POA: Diagnosis not present

## 2017-09-06 DIAGNOSIS — I700 Atherosclerosis of aorta: Secondary | ICD-10-CM | POA: Diagnosis not present

## 2017-09-06 DIAGNOSIS — I2510 Atherosclerotic heart disease of native coronary artery without angina pectoris: Secondary | ICD-10-CM | POA: Insufficient documentation

## 2017-09-06 DIAGNOSIS — R079 Chest pain, unspecified: Secondary | ICD-10-CM | POA: Diagnosis not present

## 2017-09-06 MED ORDER — METOPROLOL TARTRATE 5 MG/5ML IV SOLN
INTRAVENOUS | Status: AC
  Filled 2017-09-06: qty 20

## 2017-09-06 MED ORDER — IOPAMIDOL (ISOVUE-370) INJECTION 76%
INTRAVENOUS | Status: AC
  Filled 2017-09-06: qty 100

## 2017-09-06 MED ORDER — METOPROLOL TARTRATE 5 MG/5ML IV SOLN
5.0000 mg | INTRAVENOUS | Status: DC | PRN
  Administered 2017-09-06 (×3): 5 mg via INTRAVENOUS
  Filled 2017-09-06 (×2): qty 5

## 2017-09-06 MED ORDER — IOPAMIDOL (ISOVUE-370) INJECTION 76%
100.0000 mL | Freq: Once | INTRAVENOUS | Status: AC | PRN
  Administered 2017-09-06: 80 mL via INTRAVENOUS

## 2017-09-06 MED ORDER — NITROGLYCERIN 0.4 MG SL SUBL
0.8000 mg | SUBLINGUAL_TABLET | Freq: Once | SUBLINGUAL | Status: AC
  Administered 2017-09-06: 0.8 mg via SUBLINGUAL
  Filled 2017-09-06: qty 25

## 2017-09-06 MED ORDER — NITROGLYCERIN 0.4 MG SL SUBL
SUBLINGUAL_TABLET | SUBLINGUAL | Status: AC
  Filled 2017-09-06: qty 2

## 2017-09-10 ENCOUNTER — Encounter: Payer: Self-pay | Admitting: Internal Medicine

## 2017-09-12 ENCOUNTER — Encounter: Payer: Self-pay | Admitting: Cardiovascular Disease

## 2017-09-12 ENCOUNTER — Encounter: Payer: Self-pay | Admitting: Internal Medicine

## 2017-09-12 DIAGNOSIS — D239 Other benign neoplasm of skin, unspecified: Secondary | ICD-10-CM

## 2017-09-12 HISTORY — DX: Other benign neoplasm of skin, unspecified: D23.9

## 2017-09-17 ENCOUNTER — Encounter: Payer: Self-pay | Admitting: Internal Medicine

## 2017-09-18 ENCOUNTER — Telehealth: Payer: Self-pay | Admitting: *Deleted

## 2017-09-18 ENCOUNTER — Encounter: Payer: Self-pay | Admitting: Internal Medicine

## 2017-09-18 NOTE — Telephone Encounter (Signed)
Discussed Dr. Donivan Scull recommendations with the patient via [place].

## 2017-09-18 NOTE — Telephone Encounter (Signed)
-----   Message from Minna Merritts, MD sent at 09/16/2017  9:42 PM EDT ----- Significant artifact on CT scan Can not determine if there are blockages Up to her if she would like to proceed with further invasive testing such as a cardiac cath [facility]    ----- Message ----- From: Emily Filbert, RN Sent: 09/13/2017   6:20 PM To: Emily Filbert, RN, Minna Merritts, MD  Any thoughts on [name]'s cardiac CTA?

## 2017-09-25 ENCOUNTER — Encounter: Admission: RE | Payer: Self-pay | Source: Ambulatory Visit

## 2017-09-25 ENCOUNTER — Ambulatory Visit: Admission: RE | Admit: 2017-09-25 | Payer: Medicare Other | Source: Ambulatory Visit | Admitting: Internal Medicine

## 2017-09-25 SURGERY — ESOPHAGOGASTRODUODENOSCOPY (EGD) WITH PROPOFOL
Anesthesia: General

## 2017-09-26 ENCOUNTER — Encounter: Payer: Self-pay | Admitting: Internal Medicine

## 2017-09-26 ENCOUNTER — Ambulatory Visit (INDEPENDENT_AMBULATORY_CARE_PROVIDER_SITE_OTHER): Payer: Medicare Other | Admitting: Internal Medicine

## 2017-09-26 VITALS — BP 168/82 | HR 92 | Ht 61.0 in | Wt 226.2 lb

## 2017-09-26 DIAGNOSIS — I200 Unstable angina: Secondary | ICD-10-CM | POA: Diagnosis not present

## 2017-09-26 DIAGNOSIS — Z01812 Encounter for preprocedural laboratory examination: Secondary | ICD-10-CM

## 2017-09-26 DIAGNOSIS — I471 Supraventricular tachycardia: Secondary | ICD-10-CM

## 2017-09-26 DIAGNOSIS — I5032 Chronic diastolic (congestive) heart failure: Secondary | ICD-10-CM | POA: Diagnosis not present

## 2017-09-26 DIAGNOSIS — R0789 Other chest pain: Secondary | ICD-10-CM

## 2017-09-26 DIAGNOSIS — R0609 Other forms of dyspnea: Secondary | ICD-10-CM | POA: Diagnosis not present

## 2017-09-26 DIAGNOSIS — R931 Abnormal findings on diagnostic imaging of heart and coronary circulation: Secondary | ICD-10-CM

## 2017-09-26 NOTE — Progress Notes (Signed)
Patient Care Team: Rusty Aus, MD as PCP - General (Internal Medicine) Minna Merritts, MD as Consulting Physician (Cardiology)   HPI  [name] is a 72 y.o. female Seen in follow-up for syncope atrial tachycardia with documented nocturnal pauses.  She also has a history of exertional chest discomfort for which she underwent CT A FINDINGS of significant coronary disease OM FFR was concerning and LAD FFR was not possible and could not rule out severe stenosis  For sinus tachycardia, we tried her on beta-blocker; she had not previously be able to tolerate metoprolol succinate.  She is given prescriptions for atenolol, nebivolol and by systolic.  She has tolerated the atenolol well although she is needed to take just 12.5 because of heart rates and blood pressure that were too low  DATE TEST EF   6/19 Echo   55-65 % PA pressure normal  7/19    FFR < .8 OM and not able to calculate LAD        She has had more problems with shortness of breath.  She is volume overloaded.  She is not changed her diet appreciably.  She is responding to her diuretics but is still has increased weight  Records and Results Reviewed  Past Medical History:  Diagnosis Date  . Anginal pain ([place])   . Anxiety   . Arthritis   . CHF (congestive heart failure) ([place])   . Chronic back pain    stenosis.degenerative disc,some scoliosis  . Constipation    takes Stool Softener daily  . Coronary artery disease   . Depression    takes Cymbalta daily  . Diabetes mellitus    Type 2 diabetic. Average fasting blood sugar runs high 170-200  . Diastolic CHF ([place]) 02/77/4128   Per patient, diagnosed in 2018  . Diverticulosis   . E coli infection   . GERD (gastroesophageal reflux disease)    takes Nexium daily  . Headache   . Headache 02/16/2016  . Hemorrhoids   . History of colon polyps    benign  . History of gout    doesn't take any meds  . History of hiatal hernia   . History of vertigo    doesn't take any meds  . Hyperlipidemia    takes Praluent daily  . Hypertension    currently BP medications are on hold   . Hypothyroidism    takes Synthroid daily  . Insomnia    takes Restoril nightly  . Joint pain   . Joint swelling   . Muscle spasm    takes Robaxin as needed  . NSVT (nonsustained ventricular tachycardia) ([place])   . OSA on CPAP   . Periodic heart flutter   . Peripheral edema    takes LAsix as needed  . Peripheral vascular disease ([place])    AAA as stated per pt / was just discovered and pt states has not been referred to vascular MD   . Restless leg    takes Requip daily  . Rosacea   . Sleep apnea   . Urinary incontinence   . Urinary urgency   . Varicose veins   . Weakness    numbness and tingling.    Past Surgical History:  Procedure Laterality Date  . ABDOMINAL HYSTERECTOMY     with BSo  . CARDIAC CATHETERIZATION  2013   Normal  . CARPAL TUNNEL RELEASE Bilateral   . CATARACT EXTRACTION, BILATERAL    . CHOLECYSTECTOMY    .  COLONOSCOPY    . DIAGNOSTIC LAPAROSCOPY     multiple times  . DILATION AND CURETTAGE OF UTERUS    . ESOPHAGOGASTRODUODENOSCOPY (EGD) WITH PROPOFOL N/A 11/01/2014   Procedure: ESOPHAGOGASTRODUODENOSCOPY (EGD) WITH PROPOFOL;  Surgeon: Hulen Luster, MD;  Location: [facility] ENDOSCOPY;  Service: Gastroenterology;  Laterality: N/A;  . HARDWARE REMOVAL Left 10/11/2016   Procedure: HARDWARE REMOVAL-LEFT RADIUS;  Surgeon: Lovell Sheehan, MD;  Location: [facility] ORS;  Service: Orthopedics;  Laterality: Left;  Left Radius Wrist   . IMPLANTABLE CONTACT LENS IMPLANTATION     bilateral  . LAMINECTOMY  11/13/2015  . LUMBAR FUSION  11/2015  . LUMBAR WOUND DEBRIDEMENT N/A 12/02/2015   Procedure: WOUND Exploration;  Surgeon: Consuella Lose, MD;  Location: [place];  Service: Neurosurgery;  Laterality: N/A;  . OPEN REDUCTION INTERNAL FIXATION (ORIF) DISTAL RADIAL FRACTURE Left 08/29/2016   Procedure: OPEN REDUCTION INTERNAL FIXATION (ORIF) DISTAL RADIAL  FRACTURE;  Surgeon: Lovell Sheehan, MD;  Location: [facility] ORS;  Service: Orthopedics;  Laterality: Left;  . PICC LINE PLACE PERIPHERAL ([place] HX)     right upper arm   . ROTATOR CUFF REPAIR Left   . SAVORY DILATION N/A 11/01/2014   Procedure: SAVORY DILATION;  Surgeon: Hulen Luster, MD;  Location: [facility] ENDOSCOPY;  Service: Gastroenterology;  Laterality: N/A;  . TONSILLECTOMY    . TRIGGER FINGER RELEASE Bilateral     Current Meds  Medication Sig  . albuterol (PROVENTIL HFA;VENTOLIN HFA) 108 (90 Base) MCG/ACT inhaler Inhale 2 puffs into the lungs every 6 (six) hours as needed for wheezing or shortness of breath.  . Alirocumab (PRALUENT) 150 MG/ML SOPN Inject 150 mg into the skin every 14 (fourteen) days.  [name] aspirin 81 MG tablet Take 81 mg by mouth at bedtime.   [name] atenolol (TENORMIN) 25 MG tablet Take 25 mg by mouth daily.  . bisoprolol (ZEBETA) 5 MG tablet Take 1 tablet (5 mg total) by mouth daily.  . Cholecalciferol (VITAMIN D) 2000 units tablet Take 2,000 Units by mouth daily.  [name] desonide (DESOWEN) 0.05 % lotion Apply 1 application topically daily.  [name] esomeprazole (NEXIUM) 40 MG capsule Take 40 mg by mouth 2 (two) times daily before a meal.   . hyoscyamine (LEVSIN SL) 0.125 MG SL tablet Place 2 tablets (0.25 mg total) under the tongue every 4 (four) hours as needed for cramping.  . insulin glargine (LANTUS) 100 UNIT/ML injection Inject 32 Units into the skin daily.   [name] ipratropium-albuterol (DUONEB) 0.5-2.5 (3) MG/3ML SOLN Take 3 mLs by nebulization every 6 (six) hours as needed.  [name] levothyroxine (SYNTHROID, LEVOTHROID) 75 MCG tablet Take 75 mcg by mouth daily before breakfast.   . ondansetron (ZOFRAN) 4 MG tablet Take 1 tablet by mouth every 8 (eight) hours as needed.  . potassium chloride SA (K-DUR,KLOR-CON) 20 MEQ tablet Take 1 tablet (20 mEq total) by mouth daily.  . ranolazine (RANEXA) 1000 MG SR tablet Take 1 tablet (1,000 mg total) by mouth 2 (two) times daily.  . ropinirole (REQUIP) 5 MG  tablet Take 5 mg by mouth 2 (two) times daily. Takes in afternoon and evening  . temazepam (RESTORIL) 30 MG capsule Take 30 mg by mouth at bedtime.  . torsemide (DEMADEX) 20 MG tablet Take 1 tablet (20 mg total) by mouth daily. Take an extra 20mg  in the afternoon if weight gain or increased fluid retention noted.    Allergies  Allergen Reactions  . Reglan [Metoclopramide]   . Zetia [Ezetimibe]  Diarrhea   . Ceftin [Cefuroxime Axetil] Diarrhea  . Codeine Rash       . Penicillins Rash    Has patient had a PCN reaction causing immediate rash, facial/tongue/throat swelling, SOB or lightheadedness with hypotension: NO Has patient had a PCN reaction causing severe rash involving mucus membranes or skin necrosis: NO Has patient had a PCN retioion that required hospitalization NO Has patient had a PCN reaction occurring within the last 10 years: NO If all of the above answers are "NO", then may proceed with Cephalosporin use.  . Vicodin [Hydrocodone-Acetaminophen] Other (See Comments)    passes out      Review of Systems negative except from HPI and PMH  Physical Exam BP (!) 168/82 (BP Location: Left Arm, Patient Position: Sitting, Cuff Size: Large)   Pulse 92   Ht 5\' 1"  (1.549 m)   Wt 226 lb 4 oz (102.6 kg)   BMI 42.75 kg/m  Well developed and well nourished in no acute distress HENT normal E scleral and icterus clear Neck Supple JVP 8-10 with positive HJR  clear to ausculation Regular rate and rhythm, no murmurs gallops or rub Soft with active bowel sounds No clubbing cyanosis 1-2 + Edema Alert and oriented, grossly normal motor and sensory function Skin Warm and Dry  ECG demonstrates sinus rhythm at 92 Interval 15/08/34  Assessment and Plan:  Syncope and exertional lightheadedness  Atrial tachycardia-nonsustained  Obstructive sleep apnea treated  Nocturnal pausing up to 8 seconds  Exertional syncope and lightheadedness  Obesity  Diastolic heart  failure  Coronary artery disease with abnormal FFR   She is volume overloaded.  We will increase her diuretics from Demadex 40 daily--twice daily x3 days.  We have reviewed her CTA; we will schedule her for cath and PCI.  We have reviewed risks and benefits.   I was surprised that her pulmonary pressures were normal off of her echo; not withstanding her elevated RA pressures it would still not be significantly elevated.  No interval syncope.  Has had interval atrial tachycardia of relatively brief duration.  Mildly low blood pressure.  Continue current course  Current medicines are reviewed at length with the patient today .  The patient does not have concerns regarding medicines.  More than 50% of 40 min was spent in counseling related to the above

## 2017-09-26 NOTE — H&P (View-Only) (Signed)
Patient Care Team: Rusty Aus, MD as PCP - General (Internal Medicine) Minna Merritts, MD as Consulting Physician (Cardiology)   HPI  [name] is a 72 y.o. female Seen in follow-up for syncope atrial tachycardia with documented nocturnal pauses.  She also has a history of exertional chest discomfort for which she underwent CT A FINDINGS of significant coronary disease OM FFR was concerning and LAD FFR was not possible and could not rule out severe stenosis  For sinus tachycardia, we tried her on beta-blocker; she had not previously be able to tolerate metoprolol succinate.  She is given prescriptions for atenolol, nebivolol and by systolic.  She has tolerated the atenolol well although she is needed to take just 12.5 because of heart rates and blood pressure that were too low  DATE TEST EF   6/19 Echo   55-65 % PA pressure normal  7/19    FFR < .8 OM and not able to calculate LAD        She has had more problems with shortness of breath.  She is volume overloaded.  She is not changed her diet appreciably.  She is responding to her diuretics but is still has increased weight  Records and Results Reviewed  Past Medical History:  Diagnosis Date  . Anginal pain ([place])   . Anxiety   . Arthritis   . CHF (congestive heart failure) ([place])   . Chronic back pain    stenosis.degenerative disc,some scoliosis  . Constipation    takes Stool Softener daily  . Coronary artery disease   . Depression    takes Cymbalta daily  . Diabetes mellitus    Type 2 diabetic. Average fasting blood sugar runs high 170-200  . Diastolic CHF ([place]) 58/10/9831   Per patient, diagnosed in 2018  . Diverticulosis   . E coli infection   . GERD (gastroesophageal reflux disease)    takes Nexium daily  . Headache   . Headache 02/16/2016  . Hemorrhoids   . History of colon polyps    benign  . History of gout    doesn't take any meds  . History of hiatal hernia   . History of vertigo    doesn't take any meds  . Hyperlipidemia    takes Praluent daily  . Hypertension    currently BP medications are on hold   . Hypothyroidism    takes Synthroid daily  . Insomnia    takes Restoril nightly  . Joint pain   . Joint swelling   . Muscle spasm    takes Robaxin as needed  . NSVT (nonsustained ventricular tachycardia) ([place])   . OSA on CPAP   . Periodic heart flutter   . Peripheral edema    takes LAsix as needed  . Peripheral vascular disease ([place])    AAA as stated per pt / was just discovered and pt states has not been referred to vascular MD   . Restless leg    takes Requip daily  . Rosacea   . Sleep apnea   . Urinary incontinence   . Urinary urgency   . Varicose veins   . Weakness    numbness and tingling.    Past Surgical History:  Procedure Laterality Date  . ABDOMINAL HYSTERECTOMY     with BSo  . CARDIAC CATHETERIZATION  2013   Normal  . CARPAL TUNNEL RELEASE Bilateral   . CATARACT EXTRACTION, BILATERAL    . CHOLECYSTECTOMY    .  COLONOSCOPY    . DIAGNOSTIC LAPAROSCOPY     multiple times  . DILATION AND CURETTAGE OF UTERUS    . ESOPHAGOGASTRODUODENOSCOPY (EGD) WITH PROPOFOL N/A 11/01/2014   Procedure: ESOPHAGOGASTRODUODENOSCOPY (EGD) WITH PROPOFOL;  Surgeon: Hulen Luster, MD;  Location[facility] ENDOSCOPY;  Service: Gastroenterology;  Laterality: N/A;  . HARDWARE REMOVAL Left 10/11/2016   Procedure: HARDWARE REMOVAL-LEFT RADIUS;  Surgeon: Lovell Sheehan, MD;  Location: [facility] ORS;  Service: Orthopedics;  Laterality: Left;  Left Radius Wrist   . IMPLANTABLE CONTACT LENS IMPLANTATION     bilateral  . LAMINECTOMY  11/13/2015  . LUMBAR FUSION  11/2015  . LUMBAR WOUND DEBRIDEMENT N/A 12/02/2015   Procedure: WOUND Exploration;  Surgeon: Consuella Lose, MD;  Location: [place];  Service: Neurosurgery;  Laterality: N/A;  . OPEN REDUCTION INTERNAL FIXATION (ORIF) DISTAL RADIAL FRACTURE Left 08/29/2016   Procedure: OPEN REDUCTION INTERNAL FIXATION (ORIF) DISTAL RADIAL  FRACTURE;  Surgeon: Lovell Sheehan, MD;  Location: [facility] ORS;  Service: Orthopedics;  Laterality: Left;  . PICC LINE PLACE PERIPHERAL ([place] HX)     right upper arm   . ROTATOR CUFF REPAIR Left   . SAVORY DILATION N/A 11/01/2014   Procedure: SAVORY DILATION;  Surgeon: Hulen Luster, MD;  Location: [facility] ENDOSCOPY;  Service: Gastroenterology;  Laterality: N/A;  . TONSILLECTOMY    . TRIGGER FINGER RELEASE Bilateral     Current Meds  Medication Sig  . albuterol (PROVENTIL HFA;VENTOLIN HFA) 108 (90 Base) MCG/ACT inhaler Inhale 2 puffs into the lungs every 6 (six) hours as needed for wheezing or shortness of breath.  . Alirocumab (PRALUENT) 150 MG/ML SOPN Inject 150 mg into the skin every 14 (fourteen) days.  [name] aspirin 81 MG tablet Take 81 mg by mouth at bedtime.   [name] atenolol (TENORMIN) 25 MG tablet Take 25 mg by mouth daily.  . bisoprolol (ZEBETA) 5 MG tablet Take 1 tablet (5 mg total) by mouth daily.  . Cholecalciferol (VITAMIN D) 2000 units tablet Take 2,000 Units by mouth daily.  [name] desonide (DESOWEN) 0.05 % lotion Apply 1 application topically daily.  [name] esomeprazole (NEXIUM) 40 MG capsule Take 40 mg by mouth 2 (two) times daily before a meal.   . hyoscyamine (LEVSIN SL) 0.125 MG SL tablet Place 2 tablets (0.25 mg total) under the tongue every 4 (four) hours as needed for cramping.  . insulin glargine (LANTUS) 100 UNIT/ML injection Inject 32 Units into the skin daily.   [name] ipratropium-albuterol (DUONEB) 0.5-2.5 (3) MG/3ML SOLN Take 3 mLs by nebulization every 6 (six) hours as needed.  [name] levothyroxine (SYNTHROID, LEVOTHROID) 75 MCG tablet Take 75 mcg by mouth daily before breakfast.   . ondansetron (ZOFRAN) 4 MG tablet Take 1 tablet by mouth every 8 (eight) hours as needed.  . potassium chloride SA (K-DUR,KLOR-CON) 20 MEQ tablet Take 1 tablet (20 mEq total) by mouth daily.  . ranolazine (RANEXA) 1000 MG SR tablet Take 1 tablet (1,000 mg total) by mouth 2 (two) times daily.  . ropinirole (REQUIP) 5 MG  tablet Take 5 mg by mouth 2 (two) times daily. Takes in afternoon and evening  . temazepam (RESTORIL) 30 MG capsule Take 30 mg by mouth at bedtime.  . torsemide (DEMADEX) 20 MG tablet Take 1 tablet (20 mg total) by mouth daily. Take an extra 20mg  in the afternoon if weight gain or increased fluid retention noted.    Allergies  Allergen Reactions  . Reglan [Metoclopramide]   . Zetia [Ezetimibe]  Diarrhea   . Ceftin [Cefuroxime Axetil] Diarrhea  . Codeine Rash       . Penicillins Rash    Has patient had a PCN reaction causing immediate rash, facial/tongue/throat swelling, SOB or lightheadedness with hypotension: NO Has patient had a PCN reaction causing severe rash involving mucus membranes or skin necrosis: NO Has patient had a PCN retioion that required hospitalization NO Has patient had a PCN reaction occurring within the last 10 years: NO If all of the above answers are "NO", then may proceed with Cephalosporin use.  . Vicodin [Hydrocodone-Acetaminophen] Other (See Comments)    passes out      Review of Systems negative except from HPI and PMH  Physical Exam BP (!) 168/82 (BP Location: Left Arm, Patient Position: Sitting, Cuff Size: Large)   Pulse 92   Ht 5\' 1"  (1.549 m)   Wt 226 lb 4 oz (102.6 kg)   BMI 42.75 kg/m  Well developed and well nourished in no acute distress HENT normal E scleral and icterus clear Neck Supple JVP 8-10 with positive HJR  clear to ausculation Regular rate and rhythm, no murmurs gallops or rub Soft with active bowel sounds No clubbing cyanosis 1-2 + Edema Alert and oriented, grossly normal motor and sensory function Skin Warm and Dry  ECG demonstrates sinus rhythm at 92 Interval 15/08/34  Assessment and Plan:  Syncope and exertional lightheadedness  Atrial tachycardia-nonsustained  Obstructive sleep apnea treated  Nocturnal pausing up to 8 seconds  Exertional syncope and lightheadedness  Obesity  Diastolic heart  failure  Coronary artery disease with abnormal FFR   She is volume overloaded.  We will increase her diuretics from Demadex 40 daily--twice daily x3 days.  We have reviewed her CTA; we will schedule her for cath and PCI.  We have reviewed risks and benefits.   I was surprised that her pulmonary pressures were normal off of her echo; not withstanding her elevated RA pressures it would still not be significantly elevated.  No interval syncope.  Has had interval atrial tachycardia of relatively brief duration.  Mildly low blood pressure.  Continue current course  Current medicines are reviewed at length with the patient today .  The patient does not have concerns regarding medicines.  More than 50% of 40 min was spent in counseling related to the above

## 2017-09-26 NOTE — Patient Instructions (Addendum)
Medication Instructions: - Your physician has recommended you make the following change in your medication:   1) INCREASE demadex (torsemide) 20 mg- take 2 tablets (40 mg) by mouth TWICE daily x 3 days, then resume 2 tablets (40 mg) by mouth ONCE daily  Labwork: - Your physician recommends that you have lab work today: BMP/CBC  Procedures/Testing: - Your physician has requested that you have a cardiac catheterization. Cardiac catheterization is used to diagnose and/or treat various heart conditions. Doctors may recommend this procedure for a number of different reasons. The most common reason is to evaluate chest pain. Chest pain can be a symptom of coronary artery disease (CAD), and cardiac catheterization can show whether plaque is narrowing or blocking your heart's arteries. This procedure is also used to evaluate the valves, as well as measure the blood flow and oxygen levels in different parts of your heart. For further information please visit [email or web address]. Please follow instruction sheet, as given.  [facility] Cardiac Cath Instructions   You are scheduled for a Cardiac Cath on: Tuesday 10/01/17 with Dr. Saunders Revel  Please arrive at 7:30 am on the day of your procedure  Please expect a call from our [facility] to pre-register you  Do not eat/drink anything after midnight  Someone will need to drive you home  It is recommended someone be with you for the first 24 hours after your procedure  Wear clothes that are easy to get on/off and wear slip on shoes if possible   Medications bring a current list of all medications with you  _x__ You may take all of your medications the morning of your procedure with enough water to swallow safely, unless listed below  _x__ Do not take these medications before your procedure: 1) torsemide the morning of your procedure, 2) hold all insulins the morning of your procedure, if you take night time insulin, then take a 1/2 of your  usual dose of insulin the night prior   Day of your procedure: Arrive at the [facility] entrance.  Free valet service is available.  After entering the [facility] please check-in at the registration desk (1st desk on your right) to receive your armband. After receiving your armband someone will escort you to the cardiac cath/special procedures waiting area.  The usual length of stay after your procedure is about 2 to 3 hours.  This can vary.  If you have any questions, please call our office at [phone], or you may call the cardiac cath lab at [facility] directly at [phone]   Follow-Up: - Your physician recommends that you schedule a follow-up appointment in: 3-4 weeks with Dr. Rockey Situ, Ignacia Bayley, NP, Christell Faith, PA (post cath follow up)   Any Additional Special Instructions Will Be Listed Below (If Applicable).     If you need a refill on your cardiac medications before your next appointment, please call your pharmacy.

## 2017-09-27 LAB — CBC WITH DIFFERENTIAL/PLATELET
BASOS ABS: 0 10*3/uL (ref 0.0–0.2)
Basos: 0 %
EOS (ABSOLUTE): 0.1 10*3/uL (ref 0.0–0.4)
EOS: 1 %
HEMATOCRIT: 37 % (ref 34.0–46.6)
Hemoglobin: 12 g/dL (ref 11.1–15.9)
IMMATURE GRANULOCYTES: 0 %
Immature Grans (Abs): 0 10*3/uL (ref 0.0–0.1)
LYMPHS ABS: 2.2 10*3/uL (ref 0.7–3.1)
LYMPHS: 31 %
MCH: 29 pg (ref 26.6–33.0)
MCHC: 32.4 g/dL (ref 31.5–35.7)
MCV: 89 fL (ref 79–97)
MONOCYTES: 5 %
Monocytes Absolute: 0.4 10*3/uL (ref 0.1–0.9)
NEUTROS PCT: 63 %
Neutrophils Absolute: 4.4 10*3/uL (ref 1.4–7.0)
Platelets: 255 10*3/uL (ref 150–450)
RBC: 4.14 x10E6/uL (ref 3.77–5.28)
RDW: 14.9 % (ref 12.3–15.4)
WBC: 7.1 10*3/uL (ref 3.4–10.8)

## 2017-09-27 LAB — BASIC METABOLIC PANEL
BUN/Creatinine Ratio: 19 (ref 12–28)
BUN: 26 mg/dL (ref 8–27)
CALCIUM: 9.9 mg/dL (ref 8.7–10.3)
CO2: 29 mmol/L (ref 20–29)
CREATININE: 1.36 mg/dL — AB (ref 0.57–1.00)
Chloride: 99 mmol/L (ref 96–106)
GFR calc Af Amer: 45 mL/min/{1.73_m2} — ABNORMAL LOW (ref >59)
GFR, EST NON AFRICAN AMERICAN: 39 mL/min/{1.73_m2} — AB (ref >59)
GLUCOSE: 150 mg/dL — AB (ref 65–99)
POTASSIUM: 4.2 mmol/L (ref 3.5–5.2)
SODIUM: 143 mmol/L (ref 134–144)

## 2017-10-01 ENCOUNTER — Other Ambulatory Visit: Payer: Self-pay | Admitting: *Deleted

## 2017-10-01 ENCOUNTER — Encounter: Payer: Self-pay | Admitting: Internal Medicine

## 2017-10-01 ENCOUNTER — Ambulatory Visit
Admission: RE | Admit: 2017-10-01 | Discharge: 2017-10-01 | Disposition: A | Discharge status: Home / Self Care | Payer: Medicare Other | Source: Ambulatory Visit | Attending: Internal Medicine | Admitting: Internal Medicine

## 2017-10-01 ENCOUNTER — Encounter
Admission: RE | Disposition: A | Discharge status: Home / Self Care | Payer: Self-pay | Source: Ambulatory Visit | Attending: Internal Medicine

## 2017-10-01 DIAGNOSIS — M419 Scoliosis, unspecified: Secondary | ICD-10-CM | POA: Diagnosis not present

## 2017-10-01 DIAGNOSIS — Z9889 Other specified postprocedural states: Secondary | ICD-10-CM | POA: Insufficient documentation

## 2017-10-01 DIAGNOSIS — Z794 Long term (current) use of insulin: Secondary | ICD-10-CM | POA: Diagnosis not present

## 2017-10-01 DIAGNOSIS — M109 Gout, unspecified: Secondary | ICD-10-CM | POA: Insufficient documentation

## 2017-10-01 DIAGNOSIS — G2581 Restless legs syndrome: Secondary | ICD-10-CM | POA: Insufficient documentation

## 2017-10-01 DIAGNOSIS — I25119 Atherosclerotic heart disease of native coronary artery with unspecified angina pectoris: Secondary | ICD-10-CM | POA: Insufficient documentation

## 2017-10-01 DIAGNOSIS — E039 Hypothyroidism, unspecified: Secondary | ICD-10-CM | POA: Diagnosis not present

## 2017-10-01 DIAGNOSIS — R0609 Other forms of dyspnea: Secondary | ICD-10-CM

## 2017-10-01 DIAGNOSIS — I2584 Coronary atherosclerosis due to calcified coronary lesion: Secondary | ICD-10-CM | POA: Diagnosis not present

## 2017-10-01 DIAGNOSIS — R0602 Shortness of breath: Secondary | ICD-10-CM | POA: Diagnosis not present

## 2017-10-01 DIAGNOSIS — I471 Supraventricular tachycardia: Secondary | ICD-10-CM | POA: Insufficient documentation

## 2017-10-01 DIAGNOSIS — G4733 Obstructive sleep apnea (adult) (pediatric): Secondary | ICD-10-CM | POA: Diagnosis not present

## 2017-10-01 DIAGNOSIS — R55 Syncope and collapse: Secondary | ICD-10-CM | POA: Diagnosis not present

## 2017-10-01 DIAGNOSIS — Z79899 Other long term (current) drug therapy: Secondary | ICD-10-CM | POA: Diagnosis not present

## 2017-10-01 DIAGNOSIS — I714 Abdominal aortic aneurysm, without rupture: Secondary | ICD-10-CM | POA: Diagnosis not present

## 2017-10-01 DIAGNOSIS — I5032 Chronic diastolic (congestive) heart failure: Secondary | ICD-10-CM | POA: Diagnosis not present

## 2017-10-01 DIAGNOSIS — N289 Disorder of kidney and ureter, unspecified: Secondary | ICD-10-CM

## 2017-10-01 DIAGNOSIS — R42 Dizziness and giddiness: Secondary | ICD-10-CM | POA: Insufficient documentation

## 2017-10-01 DIAGNOSIS — I110 Hypertensive heart disease with heart failure: Secondary | ICD-10-CM | POA: Insufficient documentation

## 2017-10-01 DIAGNOSIS — E1151 Type 2 diabetes mellitus with diabetic peripheral angiopathy without gangrene: Secondary | ICD-10-CM | POA: Diagnosis not present

## 2017-10-01 DIAGNOSIS — L719 Rosacea, unspecified: Secondary | ICD-10-CM | POA: Insufficient documentation

## 2017-10-01 DIAGNOSIS — Z7982 Long term (current) use of aspirin: Secondary | ICD-10-CM | POA: Diagnosis not present

## 2017-10-01 DIAGNOSIS — R079 Chest pain, unspecified: Secondary | ICD-10-CM | POA: Diagnosis not present

## 2017-10-01 DIAGNOSIS — I25118 Atherosclerotic heart disease of native coronary artery with other forms of angina pectoris: Secondary | ICD-10-CM | POA: Diagnosis present

## 2017-10-01 DIAGNOSIS — G4700 Insomnia, unspecified: Secondary | ICD-10-CM | POA: Insufficient documentation

## 2017-10-01 DIAGNOSIS — Z9841 Cataract extraction status, right eye: Secondary | ICD-10-CM | POA: Insufficient documentation

## 2017-10-01 DIAGNOSIS — Z90722 Acquired absence of ovaries, bilateral: Secondary | ICD-10-CM | POA: Insufficient documentation

## 2017-10-01 DIAGNOSIS — Z881 Allergy status to other antibiotic agents status: Secondary | ICD-10-CM | POA: Insufficient documentation

## 2017-10-01 DIAGNOSIS — Z6841 Body Mass Index (BMI) 40.0 and over, adult: Secondary | ICD-10-CM | POA: Insufficient documentation

## 2017-10-01 DIAGNOSIS — Z90710 Acquired absence of both cervix and uterus: Secondary | ICD-10-CM | POA: Insufficient documentation

## 2017-10-01 DIAGNOSIS — K219 Gastro-esophageal reflux disease without esophagitis: Secondary | ICD-10-CM | POA: Insufficient documentation

## 2017-10-01 DIAGNOSIS — Z885 Allergy status to narcotic agent status: Secondary | ICD-10-CM | POA: Insufficient documentation

## 2017-10-01 DIAGNOSIS — E669 Obesity, unspecified: Secondary | ICD-10-CM | POA: Insufficient documentation

## 2017-10-01 DIAGNOSIS — E785 Hyperlipidemia, unspecified: Secondary | ICD-10-CM | POA: Diagnosis not present

## 2017-10-01 DIAGNOSIS — R931 Abnormal findings on diagnostic imaging of heart and coronary circulation: Secondary | ICD-10-CM

## 2017-10-01 DIAGNOSIS — M1990 Unspecified osteoarthritis, unspecified site: Secondary | ICD-10-CM | POA: Insufficient documentation

## 2017-10-01 DIAGNOSIS — Z79890 Hormone replacement therapy (postmenopausal): Secondary | ICD-10-CM | POA: Insufficient documentation

## 2017-10-01 DIAGNOSIS — Z86010 Personal history of colonic polyps: Secondary | ICD-10-CM | POA: Insufficient documentation

## 2017-10-01 DIAGNOSIS — Z880 Allergy status to penicillin: Secondary | ICD-10-CM | POA: Insufficient documentation

## 2017-10-01 DIAGNOSIS — Z981 Arthrodesis status: Secondary | ICD-10-CM | POA: Insufficient documentation

## 2017-10-01 DIAGNOSIS — Z9049 Acquired absence of other specified parts of digestive tract: Secondary | ICD-10-CM | POA: Insufficient documentation

## 2017-10-01 DIAGNOSIS — I2510 Atherosclerotic heart disease of native coronary artery without angina pectoris: Secondary | ICD-10-CM | POA: Diagnosis present

## 2017-10-01 DIAGNOSIS — R0789 Other chest pain: Secondary | ICD-10-CM | POA: Diagnosis present

## 2017-10-01 DIAGNOSIS — Z9842 Cataract extraction status, left eye: Secondary | ICD-10-CM | POA: Insufficient documentation

## 2017-10-01 DIAGNOSIS — Z888 Allergy status to other drugs, medicaments and biological substances status: Secondary | ICD-10-CM | POA: Insufficient documentation

## 2017-10-01 HISTORY — PX: LEFT HEART CATH AND CORONARY ANGIOGRAPHY: CATH118249

## 2017-10-01 LAB — GLUCOSE, CAPILLARY: Glucose-Capillary: 120 mg/dL — ABNORMAL HIGH (ref 70–99)

## 2017-10-01 SURGERY — LEFT HEART CATH AND CORONARY ANGIOGRAPHY
Anesthesia: Moderate Sedation | Laterality: Left

## 2017-10-01 MED ORDER — FUROSEMIDE 10 MG/ML IJ SOLN
40.0000 mg | Freq: Once | INTRAMUSCULAR | Status: AC
  Administered 2017-10-01: 40 mg via INTRAVENOUS

## 2017-10-01 MED ORDER — ASPIRIN 81 MG PO CHEW
CHEWABLE_TABLET | ORAL | Status: AC
  Filled 2017-10-01: qty 1

## 2017-10-01 MED ORDER — SODIUM CHLORIDE 0.9% FLUSH: 3.0000 mL | Freq: Two times a day (BID) | INTRAVENOUS | Status: DC

## 2017-10-01 MED ORDER — SODIUM CHLORIDE 0.9% FLUSH: 3.0000 mL | INTRAVENOUS | Status: DC | PRN

## 2017-10-01 MED ORDER — IOPAMIDOL (ISOVUE-300) INJECTION 61%
INTRAVENOUS | Status: DC | PRN
  Administered 2017-10-01: 30 mL via INTRA_ARTERIAL

## 2017-10-01 MED ORDER — FENTANYL CITRATE (PF) 100 MCG/2ML IJ SOLN
INTRAMUSCULAR | Status: AC
  Filled 2017-10-01: qty 2

## 2017-10-01 MED ORDER — VERAPAMIL HCL 2.5 MG/ML IV SOLN
INTRAVENOUS | Status: DC | PRN
  Administered 2017-10-01: 2.5 mg via INTRA_ARTERIAL

## 2017-10-01 MED ORDER — ONDANSETRON HCL 4 MG/2ML IJ SOLN: 4.0000 mg | Freq: Four times a day (QID) | INTRAMUSCULAR | Status: DC | PRN

## 2017-10-01 MED ORDER — HEPARIN SODIUM (PORCINE) 1000 UNIT/ML IJ SOLN
INTRAMUSCULAR | Status: AC
  Filled 2017-10-01: qty 1

## 2017-10-01 MED ORDER — ACETAMINOPHEN 325 MG PO TABS: 650.0000 mg | ORAL_TABLET | ORAL | Status: DC | PRN

## 2017-10-01 MED ORDER — SODIUM CHLORIDE 0.9 % IV SOLN: 250.0000 mL | INTRAVENOUS | Status: DC | PRN

## 2017-10-01 MED ORDER — SODIUM CHLORIDE 0.9 % WEIGHT BASED INFUSION: 1.0000 mL/kg/h | INTRAVENOUS | Status: DC

## 2017-10-01 MED ORDER — SODIUM CHLORIDE 0.9 % WEIGHT BASED INFUSION
3.0000 mL/kg/h | INTRAVENOUS | Status: DC
  Administered 2017-10-01: 3 mL/kg/h via INTRAVENOUS

## 2017-10-01 MED ORDER — FUROSEMIDE 10 MG/ML IJ SOLN
INTRAMUSCULAR | Status: AC
  Filled 2017-10-01: qty 4

## 2017-10-01 MED ORDER — ASPIRIN 81 MG PO CHEW
81.0000 mg | CHEWABLE_TABLET | ORAL | Status: AC
  Administered 2017-10-01: 81 mg via ORAL

## 2017-10-01 MED ORDER — HEPARIN SODIUM (PORCINE) 1000 UNIT/ML IJ SOLN
INTRAMUSCULAR | Status: DC | PRN
  Administered 2017-10-01: 5000 [IU] via INTRAVENOUS

## 2017-10-01 MED ORDER — MIDAZOLAM HCL 2 MG/2ML IJ SOLN
INTRAMUSCULAR | Status: DC | PRN
  Administered 2017-10-01: 1 mg via INTRAVENOUS

## 2017-10-01 MED ORDER — FENTANYL CITRATE (PF) 100 MCG/2ML IJ SOLN
INTRAMUSCULAR | Status: DC | PRN
  Administered 2017-10-01: 50 ug via INTRAVENOUS

## 2017-10-01 MED ORDER — LIDOCAINE HCL (PF) 1 % IJ SOLN
INTRAMUSCULAR | Status: AC
  Filled 2017-10-01: qty 30

## 2017-10-01 MED ORDER — HEPARIN (PORCINE) IN NACL 1000-0.9 UT/500ML-% IV SOLN
INTRAVENOUS | Status: AC
  Filled 2017-10-01: qty 1000

## 2017-10-01 MED ORDER — MIDAZOLAM HCL 2 MG/2ML IJ SOLN
INTRAMUSCULAR | Status: AC
  Filled 2017-10-01: qty 2

## 2017-10-01 MED ORDER — VERAPAMIL HCL 2.5 MG/ML IV SOLN
INTRAVENOUS | Status: AC
  Filled 2017-10-01: qty 2

## 2017-10-01 SURGICAL SUPPLY — 9 items
CATH INFINITI 5FR ANG PIGTAIL (CATHETERS) ×2 IMPLANT
CATH INFINITI 5FR TG (CATHETERS) ×2 IMPLANT
DEVICE RAD TR BAND REGULAR (VASCULAR PRODUCTS) ×2 IMPLANT
KIT MANI 3VAL PERCEP (MISCELLANEOUS) ×2 IMPLANT
NEEDLE PERC 21GX4CM (NEEDLE) ×2 IMPLANT
PACK CARDIAC CATH (CUSTOM PROCEDURE TRAY) ×2 IMPLANT
SHEATH RAIN RADIAL 21G 6FR (SHEATH) ×2 IMPLANT
TUBING CIL FLEX 10 FLL-RA (TUBING) ×2 IMPLANT
WIRE ROSEN-J .035X260CM (WIRE) ×2 IMPLANT

## 2017-10-01 NOTE — Interval H&P Note (Signed)
History and Physical Interval Note:  10/01/2017 8:13 AM  [name]  has presented today for cardiac catheterization, with the diagnosis of chest pain, shortness of breath, and abnormal cardiac CTA. The various methods of treatment have been discussed with the patient and family. After consideration of risks, benefits and other options for treatment, the patient has consented to  Procedure(s): LEFT HEART CATH AND CORONARY ANGIOGRAPHY (Left) as a surgical intervention .  The patient's history has been reviewed, patient examined, no change in status, stable for surgery.  I have reviewed the patient's chart and labs.  Questions were answered to the patient's satisfaction.    Cath Lab Visit (complete for each Cath Lab visit)  Clinical Evaluation Leading to the Procedure:   ACS: No.  Non-ACS:    Anginal Classification: CCS III  Anti-ischemic medical therapy: Maximal Therapy (2 or more classes of medications)  Non-Invasive Test Results: Equivocal test results  Prior CABG: No previous CABG  Laronica Bhagat

## 2017-10-01 NOTE — Progress Notes (Signed)
Patient advised to stop her Lodine per Dr. Saunders Revel. Patient verbalized that she cant stop taking this medication its the only thing that helps her pain. Dr. Saunders Revel notified and he verbalized that the patient needed to stop this medication per his direction. Patient educated that this nurse spoke with Dr. Saunders Revel and verified his order and she is to stop taking this medication. Verbalized understanding.

## 2017-10-01 NOTE — Brief Op Note (Signed)
BRIEF CARDIAC CATHETERIZATION NOTE  DATE: 10/01/2017 TIME: 10:13 AM  PATIENT:  [name]  72 y.o. female  PRE-OPERATIVE DIAGNOSIS:  Chest pain, shortness of breath, abnormal cardiac CTA  POST-OPERATIVE DIAGNOSIS:  Same  PROCEDURE:  Procedure(s): LEFT HEART CATH AND CORONARY ANGIOGRAPHY (Left)  SURGEON:  Surgeon(s) and Role:    * Kaevon Cotta, MD - Primary  FINDINGS: 1. Relatively small LAD with moderate mid vessel disease. 2. Small D1 with 80% ostial/proximal stenosis. 3. Large, dominant LCx without significant disease. 4. Small, non-dominant RCA. 5. Moderately elevated LVEDP.  RECOMMENDATIONS: 1. Medical therapy for LAD/diagonal disease. 2. Restart diuresis with close monitoring of renal function. 3. Consider workup for causes of diastolic dysfunction, including infiltrative process.  Nelva Bush, MD [facility] Pager: [phone]

## 2017-10-09 ENCOUNTER — Ambulatory Visit (INDEPENDENT_AMBULATORY_CARE_PROVIDER_SITE_OTHER): Payer: Medicare Other | Admitting: *Deleted

## 2017-10-09 DIAGNOSIS — N289 Disorder of kidney and ureter, unspecified: Secondary | ICD-10-CM | POA: Diagnosis not present

## 2017-10-10 LAB — BASIC METABOLIC PANEL
BUN / CREAT RATIO: 16 (ref 12–28)
BUN: 21 mg/dL (ref 8–27)
CO2: 28 mmol/L (ref 20–29)
CREATININE: 1.3 mg/dL — AB (ref 0.57–1.00)
Calcium: 10 mg/dL (ref 8.7–10.3)
Chloride: 97 mmol/L (ref 96–106)
GFR calc Af Amer: 47 mL/min/{1.73_m2} — ABNORMAL LOW (ref >59)
GFR calc non Af Amer: 41 mL/min/{1.73_m2} — ABNORMAL LOW (ref >59)
GLUCOSE: 197 mg/dL — AB (ref 65–99)
POTASSIUM: 4.1 mmol/L (ref 3.5–5.2)
SODIUM: 142 mmol/L (ref 134–144)

## 2017-10-15 ENCOUNTER — Encounter: Payer: Self-pay | Admitting: Internal Medicine

## 2017-10-15 ENCOUNTER — Ambulatory Visit: Payer: Medicare Other | Admitting: Physician Assistant

## 2017-10-15 ENCOUNTER — Ambulatory Visit (INDEPENDENT_AMBULATORY_CARE_PROVIDER_SITE_OTHER): Payer: Medicare Other | Admitting: Internal Medicine

## 2017-10-15 VITALS — BP 128/62 | HR 90 | Ht 61.0 in | Wt 223.2 lb

## 2017-10-15 DIAGNOSIS — I200 Unstable angina: Secondary | ICD-10-CM | POA: Diagnosis not present

## 2017-10-15 DIAGNOSIS — I5032 Chronic diastolic (congestive) heart failure: Secondary | ICD-10-CM

## 2017-10-15 DIAGNOSIS — I495 Sick sinus syndrome: Secondary | ICD-10-CM

## 2017-10-15 DIAGNOSIS — I442 Atrioventricular block, complete: Secondary | ICD-10-CM | POA: Diagnosis not present

## 2017-10-15 DIAGNOSIS — I471 Supraventricular tachycardia: Secondary | ICD-10-CM

## 2017-10-15 DIAGNOSIS — R55 Syncope and collapse: Secondary | ICD-10-CM | POA: Diagnosis not present

## 2017-10-15 DIAGNOSIS — I4719 Other supraventricular tachycardia: Secondary | ICD-10-CM

## 2017-10-15 DIAGNOSIS — E859 Amyloidosis, unspecified: Secondary | ICD-10-CM

## 2017-10-15 NOTE — Patient Instructions (Addendum)
Medication Instructions: - Your physician recommends that you continue on your current medications as directed. Please refer to the Current Medication list given to you today.  Labwork: - Your physician recommends that you have lab work Thursday 10/17/17 at the [facility] while you are there for your loop recorder implant- Multiple Myeloma Panel  Procedures/Testing: - Your physician has recommended that you have a loop recorder (LINQ) implant.  1) Arrive at [facility] entrance on Thursday 10/17/17 at 7:30 am- 1st desk on the right to register 2) You may eat a light breakfast 3) You may take all of your regular medications 4) You may drive home from the procedure 5) Please wash your chest and neck area with an antibacterial soap the night before and morning of the test   - Your physician has recommended that you have Nuclear medicine amyloid scan  1) Please arrive at our [facility] office on Wednesday 10/30/17 at 12:15 pm  [address] [place], [place] [address]  Follow-Up: -  Your physician recommends that you schedule a follow-up appointment in: 7-10 days (from 10/17/17) with the [facility] nurse for a wound check  - Your physician recommends that you schedule a follow-up appointment in: 2 months with Dr. Rockey Situ.   Any Additional Special Instructions Will Be Listed Below (If Applicable).     If you need a refill on your cardiac medications before your next appointment, please call your pharmacy.

## 2017-10-15 NOTE — Addendum Note (Signed)
Addended byAlvis Lemmings C on: 10/15/2017 11:06 AM   Modules accepted: Orders

## 2017-10-15 NOTE — Progress Notes (Signed)
Patient Care Team: Rusty Aus, MD as PCP - General (Internal Medicine) Minna Merritts, MD as Consulting Physician (Cardiology)   HPI  [name] is a 72 y.o. female Seen in follow-up for syncope atrial tachycardia with documented  Pauses.  It was presumed that both events occurred while she was sleeping.  One however is timed at midnight the other at the midday.  The event during the day occurred during nuclear stress testing.  She reminds me that her spells of presyncope/syncope have been associated with exertion and tachypalpitations.  On her apple watch she is seen heart rates at these moments in the 150s but also in the 40s.  She also has a history of exertional chest discomfort for which she underwent CT A FINDINGS of significant coronary disease OM FFR was concerning and LAD FFR was not possible and could not rule out severe stenosis>> LHC See Below   she is also struggling with dyspnea on exertion  For sinus tachycardia, we tried her on beta-blocker; she had not previously be able to tolerate metoprolol succinate.  She is given prescriptions for atenolol, nebivolol and by systolic.  She has tolerated the atenolol well although she is needed to take just 12.5 because of heart rates and blood pressure that were too low  DATE TEST EF   6/19 Echo   55-65 % PA pressure normal  7/19  CTA  FFR < .8 OM and not able to calculate LAD  8/19 LHC  50 %LAD mid/80%D1     Cath also elevated LVEDP  Question raised as to infiltrative disease  Records and Results Reviewed  Past Medical History:  Diagnosis Date  . Anginal pain ([place])   . Anxiety   . Arthritis   . CHF (congestive heart failure) ([place])   . Chronic back pain    stenosis.degenerative disc,some scoliosis  . Constipation    takes Stool Softener daily  . Coronary artery disease   . Depression    takes Cymbalta daily  . Diabetes mellitus    Type 2 diabetic. Average fasting blood sugar runs high 170-200  .  Diastolic CHF ([place]) 41/63/8453   Per patient, diagnosed in 2018  . Diverticulosis   . E coli infection   . GERD (gastroesophageal reflux disease)    takes Nexium daily  . Headache   . Headache 02/16/2016  . Hemorrhoids   . History of colon polyps    benign  . History of gout    doesn't take any meds  . History of hiatal hernia   . History of vertigo    doesn't take any meds  . Hyperlipidemia    takes Praluent daily  . Hypertension    currently BP medications are on hold   . Hypothyroidism    takes Synthroid daily  . Insomnia    takes Restoril nightly  . Joint pain   . Joint swelling   . Muscle spasm    takes Robaxin as needed  . NSVT (nonsustained ventricular tachycardia) ([place])   . OSA on CPAP   . Periodic heart flutter   . Peripheral edema    takes LAsix as needed  . Peripheral vascular disease ([place])    AAA as stated per pt / was just discovered and pt states has not been referred to vascular MD   . Restless leg    takes Requip daily  . Rosacea   . Sleep apnea   . Urinary incontinence   .  Urinary urgency   . Varicose veins   . Weakness    numbness and tingling.    Past Surgical History:  Procedure Laterality Date  . ABDOMINAL HYSTERECTOMY     with BSo  . CARDIAC CATHETERIZATION  2013   Normal  . CARPAL TUNNEL RELEASE Bilateral   . CATARACT EXTRACTION, BILATERAL    . CHOLECYSTECTOMY    . COLONOSCOPY    . DIAGNOSTIC LAPAROSCOPY     multiple times  . DILATION AND CURETTAGE OF UTERUS    . ESOPHAGOGASTRODUODENOSCOPY (EGD) WITH PROPOFOL N/A 11/01/2014   Procedure: ESOPHAGOGASTRODUODENOSCOPY (EGD) WITH PROPOFOL;  Surgeon: Hulen Luster, MD;  Location: [facility] ENDOSCOPY;  Service: Gastroenterology;  Laterality: N/A;  . HARDWARE REMOVAL Left 10/11/2016   Procedure: HARDWARE REMOVAL-LEFT RADIUS;  Surgeon: Lovell Sheehan, MD;  Location: [facility] ORS;  Service: Orthopedics;  Laterality: Left;  Left Radius Wrist   . IMPLANTABLE CONTACT LENS IMPLANTATION     bilateral  .  LAMINECTOMY  11/13/2015  . LEFT HEART CATH AND CORONARY ANGIOGRAPHY Left 10/01/2017   Procedure: LEFT HEART CATH AND CORONARY ANGIOGRAPHY;  Surgeon: Nelva Bush, MD;  Location: [place] CV LAB;  Service: Cardiovascular;  Laterality: Left;  . LUMBAR FUSION  11/2015  . LUMBAR WOUND DEBRIDEMENT N/A 12/02/2015   Procedure: WOUND Exploration;  Surgeon: Consuella Lose, MD;  Location: [place];  Service: Neurosurgery;  Laterality: N/A;  . OPEN REDUCTION INTERNAL FIXATION (ORIF) DISTAL RADIAL FRACTURE Left 08/29/2016   Procedure: OPEN REDUCTION INTERNAL FIXATION (ORIF) DISTAL RADIAL FRACTURE;  Surgeon: Lovell Sheehan, MD;  Location: [facility] ORS;  Service: Orthopedics;  Laterality: Left;  . PICC LINE PLACE PERIPHERAL ([place] HX)     right upper arm   . ROTATOR CUFF REPAIR Left   . SAVORY DILATION N/A 11/01/2014   Procedure: SAVORY DILATION;  Surgeon: Hulen Luster, MD;  Location: [facility] ENDOSCOPY;  Service: Gastroenterology;  Laterality: N/A;  . TONSILLECTOMY    . TRIGGER FINGER RELEASE Bilateral     Current Meds  Medication Sig  . albuterol (PROVENTIL HFA;VENTOLIN HFA) 108 (90 Base) MCG/ACT inhaler Inhale 2 puffs into the lungs every 6 (six) hours as needed for wheezing or shortness of breath.  . Alirocumab (PRALUENT) 150 MG/ML SOPN Inject 150 mg into the skin every 14 (fourteen) days.  [name] aspirin 81 MG tablet Take 81 mg by mouth at bedtime.   [name] atenolol (TENORMIN) 25 MG tablet Take 25 mg by mouth daily.  [name] azelastine (ASTELIN) 0.1 % nasal spray Place 2 sprays into both nostrils at bedtime as needed for rhinitis. Use in each nostril as directed  . Cholecalciferol (VITAMIN D) 2000 units tablet Take 2,000 Units by mouth daily.  [name] desonide (DESOWEN) 0.05 % lotion Apply 1 application topically daily.  [name] dicyclomine (BENTYL) 10 MG capsule Take 10 mg by mouth 4 (four) times daily -  before meals and at bedtime.  [name] esomeprazole (NEXIUM) 40 MG capsule Take 40 mg by mouth 2 (two) times daily before a meal.     . gabapentin (NEURONTIN) 300 MG capsule Take 300 mg by mouth 2 (two) times daily.  . hyoscyamine (LEVSIN SL) 0.125 MG SL tablet Place 2 tablets (0.25 mg total) under the tongue every 4 (four) hours as needed for cramping.  . insulin glargine (LANTUS) 100 UNIT/ML injection Inject 32 Units into the skin every morning.   [name] ipratropium-albuterol (DUONEB) 0.5-2.5 (3) MG/3ML SOLN Take 3 mLs by nebulization every 6 (six) hours as needed. (Patient taking differently: Take  3 mLs by nebulization every 6 (six) hours as needed (SOB). )  . levothyroxine (SYNTHROID, LEVOTHROID) 75 MCG tablet Take 75 mcg by mouth daily before breakfast.   . nitroGLYCERIN (NITROSTAT) 0.4 MG SL tablet Place 1 tablet (0.4 mg total) under the tongue every 5 (five) minutes as needed for chest pain.  [name] ondansetron (ZOFRAN) 4 MG tablet Take 1 tablet by mouth every 8 (eight) hours as needed for nausea or vomiting.   . pantoprazole (PROTONIX) 40 MG tablet Take 40 mg by mouth 2 (two) times daily.  . potassium chloride SA (K-DUR,KLOR-CON) 20 MEQ tablet Take 1 tablet (20 mEq total) by mouth daily. (Patient taking differently: Take 40 mEq by mouth daily. )  . ranolazine (RANEXA) 1000 MG SR tablet Take 1 tablet (1,000 mg total) by mouth 2 (two) times daily.  . ropinirole (REQUIP) 5 MG tablet Take 5 mg by mouth 2 (two) times daily. Takes in afternoon and evening  . temazepam (RESTORIL) 30 MG capsule Take 30 mg by mouth at bedtime.  . torsemide (DEMADEX) 20 MG tablet Take 40 mg by mouth daily. Take 2 tablets (40 mg) by mouth once daily   . traMADol (ULTRAM) 50 MG tablet Take 50 mg by mouth every 6 (six) hours as needed for moderate pain or severe pain.    Allergies  Allergen Reactions  . Reglan [Metoclopramide] Diarrhea  . Zetia [Ezetimibe]     Diarrhea   . Ceftin [Cefuroxime Axetil] Diarrhea  . Codeine Rash       . Penicillins Rash    Has patient had a PCN reaction causing immediate rash, facial/tongue/throat swelling, SOB or  lightheadedness with hypotension: NO Has patient had a PCN reaction causing severe rash involving mucus membranes or skin necrosis: NO Has patient had a PCN retioion that required hospitalization NO Has patient had a PCN reaction occurring within the last 10 years: NO If all of the above answers are "NO", then may proceed with Cephalosporin use.  . Vicodin [Hydrocodone-Acetaminophen] Other (See Comments)    passes out      Review of Systems negative except from HPI and PMH  Physical Exam BP 128/62 (BP Location: Left Arm, Patient Position: Sitting, Cuff Size: Large)   Pulse 90   Ht 5\' 1"  (1.549 m)   Wt 223 lb 4 oz (101.3 kg)   BMI 42.18 kg/m  /pes   ECG demonstrates sinus 90 15/07/36 Low volts precordium   Assessment and Plan:  Syncope and exertional lightheadedness  Atrial tachycardia-nonsustained  Obstructive sleep apnea treated  Nocturnal pausing up to 8 seconds  Exertional syncope and lightheadedness  Obesity  Diastolic heart failure  Coronary artery disease with abnormal FFR>> Cath mod LAD/D1 disease   Volume status is somewhat better.  We will continue her current dose of diuretics.  Continues to have some exertional chest pain.  We will continue ranolazine.  Blood pressure has limited her ability to uptitrate other medications.  Reviewing her event recorder the pauses that she had during the day occurred during [place] stress test; the others occurred at night presently with her CPAP off.  We discussed the role of a Linq recorder looking for the mechanism of her syncope associated with the tachycardia/bradycardia.  Is agreeable.  Given Dr. Darnelle Bos concerns of infiltrative disease, we will order myeloma panel and a pyrophosphate scan.  More than 50% of 40 min was spent in counseling related to the above

## 2017-10-15 NOTE — H&P (View-Only) (Signed)
Patient Care Team: Rusty Aus, MD as PCP - General (Internal Medicine) Ana Merritts, MD as Consulting Physician (Cardiology)   HPI  [name] is a 72 y.o. female Seen in follow-up for syncope atrial tachycardia with documented  Pauses.  It was presumed that both events occurred while she was sleeping.  One however is timed at midnight the other at the midday.  The event during the day occurred during nuclear stress testing.  She reminds me that her spells of presyncope/syncope have been associated with exertion and tachypalpitations.  On her apple watch she is seen heart rates at these moments in the 150s but also in the 40s.  She also has a history of exertional chest discomfort for which she underwent CT A FINDINGS of significant coronary disease OM FFR was concerning and LAD FFR was not possible and could not rule out severe stenosis>> LHC See Below   she is also struggling with dyspnea on exertion  For sinus tachycardia, we tried her on beta-blocker; she had not previously be able to tolerate metoprolol succinate.  She is given prescriptions for atenolol, nebivolol and by systolic.  She has tolerated the atenolol well although she is needed to take just 12.5 because of heart rates and blood pressure that were too low  DATE TEST EF   6/19 Echo   55-65 % PA pressure normal  7/19  CTA  FFR < .8 OM and not able to calculate LAD  8/19 LHC  50 %LAD mid/80%D1     Cath also elevated LVEDP  Question raised as to infiltrative disease  Records and Results Reviewed  Past Medical History:  Diagnosis Date  . Anginal pain ([place])   . Anxiety   . Arthritis   . CHF (congestive heart failure) ([place])   . Chronic back pain    stenosis.degenerative disc,some scoliosis  . Constipation    takes Stool Softener daily  . Coronary artery disease   . Depression    takes Cymbalta daily  . Diabetes mellitus    Type 2 diabetic. Average fasting blood sugar runs high 170-200  .  Diastolic CHF ([place]) 88/28/0034   Per patient, diagnosed in 2018  . Diverticulosis   . E coli infection   . GERD (gastroesophageal reflux disease)    takes Nexium daily  . Headache   . Headache 02/16/2016  . Hemorrhoids   . History of colon polyps    benign  . History of gout    doesn't take any meds  . History of hiatal hernia   . History of vertigo    doesn't take any meds  . Hyperlipidemia    takes Praluent daily  . Hypertension    currently BP medications are on hold   . Hypothyroidism    takes Synthroid daily  . Insomnia    takes Restoril nightly  . Joint pain   . Joint swelling   . Muscle spasm    takes Robaxin as needed  . NSVT (nonsustained ventricular tachycardia) ([place])   . OSA on CPAP   . Periodic heart flutter   . Peripheral edema    takes LAsix as needed  . Peripheral vascular disease ([place])    AAA as stated per pt / was just discovered and pt states has not been referred to vascular MD   . Restless leg    takes Requip daily  . Rosacea   . Sleep apnea   . Urinary incontinence   .  Urinary urgency   . Varicose veins   . Weakness    numbness and tingling.    Past Surgical History:  Procedure Laterality Date  . ABDOMINAL HYSTERECTOMY     with BSo  . CARDIAC CATHETERIZATION  2013   Normal  . CARPAL TUNNEL RELEASE Bilateral   . CATARACT EXTRACTION, BILATERAL    . CHOLECYSTECTOMY    . COLONOSCOPY    . DIAGNOSTIC LAPAROSCOPY     multiple times  . DILATION AND CURETTAGE OF UTERUS    . ESOPHAGOGASTRODUODENOSCOPY (EGD) WITH PROPOFOL N/A 11/01/2014   Procedure: ESOPHAGOGASTRODUODENOSCOPY (EGD) WITH PROPOFOL;  Surgeon: Hulen Luster, MD;  Location: [facility] ENDOSCOPY;  Service: Gastroenterology;  Laterality: N/A;  . HARDWARE REMOVAL Left 10/11/2016   Procedure: HARDWARE REMOVAL-LEFT RADIUS;  Surgeon: Lovell Sheehan, MD;  Location: [facility] ORS;  Service: Orthopedics;  Laterality: Left;  Left Radius Wrist   . IMPLANTABLE CONTACT LENS IMPLANTATION     bilateral  .  LAMINECTOMY  11/13/2015  . LEFT HEART CATH AND CORONARY ANGIOGRAPHY Left 10/01/2017   Procedure: LEFT HEART CATH AND CORONARY ANGIOGRAPHY;  Surgeon: Nelva Bush, MD;  Location: [place] CV LAB;  Service: Cardiovascular;  Laterality: Left;  . LUMBAR FUSION  11/2015  . LUMBAR WOUND DEBRIDEMENT N/A 12/02/2015   Procedure: WOUND Exploration;  Surgeon: Consuella Lose, MD;  Location: [place];  Service: Neurosurgery;  Laterality: N/A;  . OPEN REDUCTION INTERNAL FIXATION (ORIF) DISTAL RADIAL FRACTURE Left 08/29/2016   Procedure: OPEN REDUCTION INTERNAL FIXATION (ORIF) DISTAL RADIAL FRACTURE;  Surgeon: Lovell Sheehan, MD;  Location: [facility] ORS;  Service: Orthopedics;  Laterality: Left;  . PICC LINE PLACE PERIPHERAL ([place] HX)     right upper arm   . ROTATOR CUFF REPAIR Left   . SAVORY DILATION N/A 11/01/2014   Procedure: SAVORY DILATION;  Surgeon: Hulen Luster, MD;  Location: [facility] ENDOSCOPY;  Service: Gastroenterology;  Laterality: N/A;  . TONSILLECTOMY    . TRIGGER FINGER RELEASE Bilateral     Current Meds  Medication Sig  . albuterol (PROVENTIL HFA;VENTOLIN HFA) 108 (90 Base) MCG/ACT inhaler Inhale 2 puffs into the lungs every 6 (six) hours as needed for wheezing or shortness of breath.  . Alirocumab (PRALUENT) 150 MG/ML SOPN Inject 150 mg into the skin every 14 (fourteen) days.  [name] aspirin 81 MG tablet Take 81 mg by mouth at bedtime.   [name] atenolol (TENORMIN) 25 MG tablet Take 25 mg by mouth daily.  [name] azelastine (ASTELIN) 0.1 % nasal spray Place 2 sprays into both nostrils at bedtime as needed for rhinitis. Use in each nostril as directed  . Cholecalciferol (VITAMIN D) 2000 units tablet Take 2,000 Units by mouth daily.  [name] desonide (DESOWEN) 0.05 % lotion Apply 1 application topically daily.  [name] dicyclomine (BENTYL) 10 MG capsule Take 10 mg by mouth 4 (four) times daily -  before meals and at bedtime.  [name] esomeprazole (NEXIUM) 40 MG capsule Take 40 mg by mouth 2 (two) times daily before a meal.     . gabapentin (NEURONTIN) 300 MG capsule Take 300 mg by mouth 2 (two) times daily.  . hyoscyamine (LEVSIN SL) 0.125 MG SL tablet Place 2 tablets (0.25 mg total) under the tongue every 4 (four) hours as needed for cramping.  . insulin glargine (LANTUS) 100 UNIT/ML injection Inject 32 Units into the skin every morning.   [name] ipratropium-albuterol (DUONEB) 0.5-2.5 (3) MG/3ML SOLN Take 3 mLs by nebulization every 6 (six) hours as needed. (Patient taking differently: Take  3 mLs by nebulization every 6 (six) hours as needed (SOB). )  . levothyroxine (SYNTHROID, LEVOTHROID) 75 MCG tablet Take 75 mcg by mouth daily before breakfast.   . nitroGLYCERIN (NITROSTAT) 0.4 MG SL tablet Place 1 tablet (0.4 mg total) under the tongue every 5 (five) minutes as needed for chest pain.  [name] ondansetron (ZOFRAN) 4 MG tablet Take 1 tablet by mouth every 8 (eight) hours as needed for nausea or vomiting.   . pantoprazole (PROTONIX) 40 MG tablet Take 40 mg by mouth 2 (two) times daily.  . potassium chloride SA (K-DUR,KLOR-CON) 20 MEQ tablet Take 1 tablet (20 mEq total) by mouth daily. (Patient taking differently: Take 40 mEq by mouth daily. )  . ranolazine (RANEXA) 1000 MG SR tablet Take 1 tablet (1,000 mg total) by mouth 2 (two) times daily.  . ropinirole (REQUIP) 5 MG tablet Take 5 mg by mouth 2 (two) times daily. Takes in afternoon and evening  . temazepam (RESTORIL) 30 MG capsule Take 30 mg by mouth at bedtime.  . torsemide (DEMADEX) 20 MG tablet Take 40 mg by mouth daily. Take 2 tablets (40 mg) by mouth once daily   . traMADol (ULTRAM) 50 MG tablet Take 50 mg by mouth every 6 (six) hours as needed for moderate pain or severe pain.    Allergies  Allergen Reactions  . Reglan [Metoclopramide] Diarrhea  . Zetia [Ezetimibe]     Diarrhea   . Ceftin [Cefuroxime Axetil] Diarrhea  . Codeine Rash       . Penicillins Rash    Has patient had a PCN reaction causing immediate rash, facial/tongue/throat swelling, SOB or  lightheadedness with hypotension: NO Has patient had a PCN reaction causing severe rash involving mucus membranes or skin necrosis: NO Has patient had a PCN retioion that required hospitalization NO Has patient had a PCN reaction occurring within the last 10 years: NO If all of the above answers are "NO", then may proceed with Cephalosporin use.  . Vicodin [Hydrocodone-Acetaminophen] Other (See Comments)    passes out      Review of Systems negative except from HPI and PMH  Physical Exam BP 128/62 (BP Location: Left Arm, Patient Position: Sitting, Cuff Size: Large)   Pulse 90   Ht 5\' 1"  (1.549 m)   Wt 223 lb 4 oz (101.3 kg)   BMI 42.18 kg/m  /pes   ECG demonstrates sinus 90 15/07/36 Low volts precordium   Assessment and Plan:  Syncope and exertional lightheadedness  Atrial tachycardia-nonsustained  Obstructive sleep apnea treated  Nocturnal pausing up to 8 seconds  Exertional syncope and lightheadedness  Obesity  Diastolic heart failure  Coronary artery disease with abnormal FFR>> Cath mod LAD/D1 disease   Volume status is somewhat better.  We will continue her current dose of diuretics.  Continues to have some exertional chest pain.  We will continue ranolazine.  Blood pressure has limited her ability to uptitrate other medications.  Reviewing her event recorder the pauses that she had during the day occurred during [place] stress test; the others occurred at night presently with her CPAP off.  We discussed the role of a Linq recorder looking for the mechanism of her syncope associated with the tachycardia/bradycardia.  Is agreeable.  Given Dr. Darnelle Bos concerns of infiltrative disease, we will order myeloma panel and a pyrophosphate scan.  More than 50% of 40 min was spent in counseling related to the above

## 2017-10-16 ENCOUNTER — Ambulatory Visit: Payer: Medicare Other | Admitting: Internal Medicine

## 2017-10-16 ENCOUNTER — Other Ambulatory Visit: Payer: Self-pay | Admitting: Internal Medicine

## 2017-10-17 ENCOUNTER — Encounter: Payer: Self-pay | Admitting: Internal Medicine

## 2017-10-17 ENCOUNTER — Ambulatory Visit
Admission: RE | Admit: 2017-10-17 | Discharge: 2017-10-17 | Disposition: A | Discharge status: Home / Self Care | Payer: Medicare Other | Source: Ambulatory Visit | Attending: Internal Medicine | Admitting: Internal Medicine

## 2017-10-17 ENCOUNTER — Encounter
Admission: RE | Disposition: A | Discharge status: Home / Self Care | Payer: Self-pay | Source: Ambulatory Visit | Attending: Internal Medicine

## 2017-10-17 ENCOUNTER — Other Ambulatory Visit
Admission: RE | Admit: 2017-10-17 | Discharge: 2017-10-17 | Disposition: A | Discharge status: Home / Self Care | Payer: Medicare Other | Source: Ambulatory Visit | Attending: Internal Medicine | Admitting: Internal Medicine

## 2017-10-17 DIAGNOSIS — K219 Gastro-esophageal reflux disease without esophagitis: Secondary | ICD-10-CM | POA: Insufficient documentation

## 2017-10-17 DIAGNOSIS — I110 Hypertensive heart disease with heart failure: Secondary | ICD-10-CM | POA: Insufficient documentation

## 2017-10-17 DIAGNOSIS — I714 Abdominal aortic aneurysm, without rupture: Secondary | ICD-10-CM | POA: Insufficient documentation

## 2017-10-17 DIAGNOSIS — Z885 Allergy status to narcotic agent status: Secondary | ICD-10-CM | POA: Insufficient documentation

## 2017-10-17 DIAGNOSIS — F329 Major depressive disorder, single episode, unspecified: Secondary | ICD-10-CM | POA: Insufficient documentation

## 2017-10-17 DIAGNOSIS — Z7982 Long term (current) use of aspirin: Secondary | ICD-10-CM | POA: Diagnosis not present

## 2017-10-17 DIAGNOSIS — E1151 Type 2 diabetes mellitus with diabetic peripheral angiopathy without gangrene: Secondary | ICD-10-CM | POA: Diagnosis not present

## 2017-10-17 DIAGNOSIS — Z86010 Personal history of colonic polyps: Secondary | ICD-10-CM | POA: Insufficient documentation

## 2017-10-17 DIAGNOSIS — G2581 Restless legs syndrome: Secondary | ICD-10-CM | POA: Insufficient documentation

## 2017-10-17 DIAGNOSIS — Z955 Presence of coronary angioplasty implant and graft: Secondary | ICD-10-CM | POA: Insufficient documentation

## 2017-10-17 DIAGNOSIS — Z9889 Other specified postprocedural states: Secondary | ICD-10-CM

## 2017-10-17 DIAGNOSIS — M1990 Unspecified osteoarthritis, unspecified site: Secondary | ICD-10-CM | POA: Insufficient documentation

## 2017-10-17 DIAGNOSIS — Z794 Long term (current) use of insulin: Secondary | ICD-10-CM | POA: Diagnosis not present

## 2017-10-17 DIAGNOSIS — Z880 Allergy status to penicillin: Secondary | ICD-10-CM | POA: Insufficient documentation

## 2017-10-17 DIAGNOSIS — Z79890 Hormone replacement therapy (postmenopausal): Secondary | ICD-10-CM | POA: Diagnosis not present

## 2017-10-17 DIAGNOSIS — Z8719 Personal history of other diseases of the digestive system: Secondary | ICD-10-CM | POA: Insufficient documentation

## 2017-10-17 DIAGNOSIS — I471 Supraventricular tachycardia: Secondary | ICD-10-CM | POA: Insufficient documentation

## 2017-10-17 DIAGNOSIS — M109 Gout, unspecified: Secondary | ICD-10-CM | POA: Diagnosis not present

## 2017-10-17 DIAGNOSIS — I2510 Atherosclerotic heart disease of native coronary artery without angina pectoris: Secondary | ICD-10-CM | POA: Insufficient documentation

## 2017-10-17 DIAGNOSIS — I5032 Chronic diastolic (congestive) heart failure: Secondary | ICD-10-CM | POA: Diagnosis not present

## 2017-10-17 DIAGNOSIS — Z9049 Acquired absence of other specified parts of digestive tract: Secondary | ICD-10-CM | POA: Insufficient documentation

## 2017-10-17 DIAGNOSIS — G4733 Obstructive sleep apnea (adult) (pediatric): Secondary | ICD-10-CM | POA: Insufficient documentation

## 2017-10-17 DIAGNOSIS — Z9842 Cataract extraction status, left eye: Secondary | ICD-10-CM | POA: Insufficient documentation

## 2017-10-17 DIAGNOSIS — E039 Hypothyroidism, unspecified: Secondary | ICD-10-CM | POA: Insufficient documentation

## 2017-10-17 DIAGNOSIS — E785 Hyperlipidemia, unspecified: Secondary | ICD-10-CM | POA: Insufficient documentation

## 2017-10-17 DIAGNOSIS — G4700 Insomnia, unspecified: Secondary | ICD-10-CM | POA: Diagnosis not present

## 2017-10-17 DIAGNOSIS — R55 Syncope and collapse: Secondary | ICD-10-CM | POA: Insufficient documentation

## 2017-10-17 DIAGNOSIS — E859 Amyloidosis, unspecified: Secondary | ICD-10-CM

## 2017-10-17 DIAGNOSIS — Z90710 Acquired absence of both cervix and uterus: Secondary | ICD-10-CM | POA: Insufficient documentation

## 2017-10-17 DIAGNOSIS — Z8619 Personal history of other infectious and parasitic diseases: Secondary | ICD-10-CM | POA: Diagnosis not present

## 2017-10-17 DIAGNOSIS — F419 Anxiety disorder, unspecified: Secondary | ICD-10-CM | POA: Insufficient documentation

## 2017-10-17 DIAGNOSIS — Z888 Allergy status to other drugs, medicaments and biological substances status: Secondary | ICD-10-CM | POA: Insufficient documentation

## 2017-10-17 DIAGNOSIS — Z9841 Cataract extraction status, right eye: Secondary | ICD-10-CM | POA: Insufficient documentation

## 2017-10-17 DIAGNOSIS — Z79899 Other long term (current) drug therapy: Secondary | ICD-10-CM | POA: Diagnosis not present

## 2017-10-17 DIAGNOSIS — Z6841 Body Mass Index (BMI) 40.0 and over, adult: Secondary | ICD-10-CM | POA: Insufficient documentation

## 2017-10-17 DIAGNOSIS — Z90722 Acquired absence of ovaries, bilateral: Secondary | ICD-10-CM | POA: Insufficient documentation

## 2017-10-17 DIAGNOSIS — E669 Obesity, unspecified: Secondary | ICD-10-CM | POA: Insufficient documentation

## 2017-10-17 DIAGNOSIS — Z881 Allergy status to other antibiotic agents status: Secondary | ICD-10-CM | POA: Insufficient documentation

## 2017-10-17 HISTORY — PX: LOOP RECORDER INSERTION: EP1214

## 2017-10-17 HISTORY — DX: Other specified postprocedural states: Z98.890

## 2017-10-17 SURGERY — LOOP RECORDER INSERTION
Anesthesia: LOCAL

## 2017-10-17 MED ORDER — LIDOCAINE-EPINEPHRINE (PF) 1 %-1:200000 IJ SOLN
INTRAMUSCULAR | Status: AC
  Filled 2017-10-17: qty 30

## 2017-10-17 SURGICAL SUPPLY — 2 items
LOOP REVEAL LINQSYS (Prosthesis & Implant Heart) ×2 IMPLANT
PACK LOOP INSERTION (CUSTOM PROCEDURE TRAY) ×2 IMPLANT

## 2017-10-17 NOTE — Interval H&P Note (Signed)
History and Physical Interval Note:  10/17/2017 8:59 AM  [name]  has presented today for surgery, with the diagnosis of Loop Recorder Insertion    Syncope    BEDSIDE  Start time 8:30a cc:  S [place]/M [place]ley  The various methods of treatment have been discussed with the patient and family. After consideration of risks, benefits and other options for treatment, the patient has consented to  Procedure(s): LOOP RECORDER INSERTION (N/A) as a surgical intervention .  The patient's history has been reviewed, patient examined, no change in status, stable for surgery.  I have reviewed the patient's chart and labs.  Questions were answered to the patient's satisfaction.     Virl Axe

## 2017-10-17 NOTE — Discharge Instructions (Signed)
You may remove the external clear dressing on Monday Sept 9, 2019.   Please leave the Steri-strips in place until your follow up appointment with Dr Caryl Comes on Sept 17 at 2pm.  Dr Caryl Comes may remove them at this appointment

## 2017-10-18 LAB — MULTIPLE MYELOMA PANEL, SERUM
ALBUMIN/GLOB SERPL: 1.2 (ref 0.7–1.7)
ALPHA 1: 0.2 g/dL (ref 0.0–0.4)
Albumin SerPl Elph-Mcnc: 3.4 g/dL (ref 2.9–4.4)
Alpha2 Glob SerPl Elph-Mcnc: 0.8 g/dL (ref 0.4–1.0)
B-Globulin SerPl Elph-Mcnc: 1.2 g/dL (ref 0.7–1.3)
Gamma Glob SerPl Elph-Mcnc: 0.7 g/dL (ref 0.4–1.8)
Globulin, Total: 2.9 g/dL (ref 2.2–3.9)
IGA: 287 mg/dL (ref 64–422)
IGG (IMMUNOGLOBIN G), SERUM: 755 mg/dL (ref 700–1600)
IGM (IMMUNOGLOBULIN M), SRM: 91 mg/dL (ref 26–217)
TOTAL PROTEIN ELP: 6.3 g/dL (ref 6.0–8.5)

## 2017-10-21 ENCOUNTER — Telehealth: Payer: Self-pay | Admitting: Internal Medicine

## 2017-10-21 NOTE — Telephone Encounter (Signed)
Pt states her wound site is red and oozing at night.  Please call to discuss

## 2017-10-21 NOTE — Telephone Encounter (Signed)
Called patient d/t her concerns with her ILR device site. DOI: 10/16/17. Patient states that she's noticed some "clear drainage" on her night gown the last 2 nights. Patient states that the drainage does not have an odor. Patient denies any active draining/bleeding at present.  Patient states that she also developed a rash/redness from the tegaderm/gauze dressing, so that was removed.  Patient also states that she clipped away the top of her steri strips bc they were peeling back. She noticed some redness in that area as well.  I offered patient an appt with the device clinic for today at 1400 for evaluation, patient declined d/t sick dog.  Appt made with the [facility] for tomorrow @ 1400. Patient verbalized understanding.

## 2017-10-22 ENCOUNTER — Ambulatory Visit (INDEPENDENT_AMBULATORY_CARE_PROVIDER_SITE_OTHER): Payer: Medicare Other | Admitting: *Deleted

## 2017-10-22 DIAGNOSIS — R55 Syncope and collapse: Secondary | ICD-10-CM

## 2017-10-22 NOTE — Progress Notes (Signed)
Patient seen in clinic d/t concerns with her ILR device site. Patient notices bloody drainage on her clothes today. She reports no odor to the drainage. She also reports redness and stabbing pain around the incision. She states no fever or chills.   Steri strips removed. No active draining or bleed from the site. Redness noted around the incision. Incision edges approximated. Per patient permission sent a picture to Dr. Caryl Comes and placed picture below in the chart. Dr. Caryl Comes recommends patient to recheck wound next week. Wound check scheduled in [place] on Tuesday 9/17 at 2:00. Educated patient to call with worsening symptoms. Patient education done on Care[facility] monitor and symptom activator. Patient verbalized understanding.

## 2017-10-23 ENCOUNTER — Encounter (HOSPITAL_COMMUNITY): Payer: Medicare Other

## 2017-10-28 ENCOUNTER — Ambulatory Visit: Payer: Medicare Other | Admitting: Cardiovascular Disease

## 2017-10-28 ENCOUNTER — Telehealth (HOSPITAL_COMMUNITY): Payer: Self-pay | Admitting: *Deleted

## 2017-10-28 NOTE — Telephone Encounter (Signed)
Patient given detailed instructions per Amyloid Study Information Sheet for the test on 10/30/17 at 1230. Patient notified to arrive 15 minutes early and that it is imperative to arrive on time for appointment to keep from having the test rescheduled.  If you need to cancel or reschedule your appointment, please call the office within 24 hours of your appointment. . Patient verbalized understanding.Zackery Brine, Ranae [place]ms

## 2017-10-29 ENCOUNTER — Ambulatory Visit: Payer: Medicare Other

## 2017-10-30 ENCOUNTER — Ambulatory Visit (HOSPITAL_COMMUNITY): Payer: Medicare Other | Attending: Cardiology

## 2017-10-30 DIAGNOSIS — E859 Amyloidosis, unspecified: Secondary | ICD-10-CM | POA: Diagnosis not present

## 2017-10-30 MED ORDER — TECHNETIUM TC 99M PYROPHOSPHATE
21.9000 | Freq: Once | INTRAVENOUS | Status: AC
  Administered 2017-10-30: 21.9 via INTRAVENOUS

## 2017-10-31 ENCOUNTER — Telehealth: Payer: Self-pay

## 2017-10-31 NOTE — Telephone Encounter (Signed)
Completed paper Prior Auth for Praluent  Submitted Dr. Donivan Scull last office note and most recent lipids. Faxed to [phone]  Fax received from [facility] is approved.  Coverage is effective until 10/31/2018

## 2017-11-06 ENCOUNTER — Ambulatory Visit (INDEPENDENT_AMBULATORY_CARE_PROVIDER_SITE_OTHER): Payer: Medicare Other | Admitting: *Deleted

## 2017-11-06 DIAGNOSIS — R55 Syncope and collapse: Secondary | ICD-10-CM

## 2017-11-09 NOTE — Progress Notes (Signed)
Wound check in clinic s/p ILR implant. Wound well healed without redness or edema. Incision edges approximated. Normal device function. Battery status: Good. R-waves 0.5mV. (2) symptom episodes - patient c/o presyncopal symptoms, SR per ECGs, 0 tachy episodes, 0 pause episodes, 0 brady episodes. 0 AF episodes (0% burden). Patient education completed including wound care, remote monitoring, and billing. Monthly summary reports and ROV with SK/B in 3 months.

## 2017-11-19 ENCOUNTER — Ambulatory Visit (INDEPENDENT_AMBULATORY_CARE_PROVIDER_SITE_OTHER): Payer: Medicare Other | Admitting: *Deleted

## 2017-11-19 DIAGNOSIS — R55 Syncope and collapse: Secondary | ICD-10-CM

## 2017-11-20 NOTE — Progress Notes (Signed)
Carelink Summary Report / Loop Recorder 

## 2017-12-02 LAB — CUP PACEART REMOTE DEVICE CHECK
Date Time Interrogation Session: 20191008124134
Implantable Pulse Generator Implant Date: 20190905

## 2017-12-03 ENCOUNTER — Ambulatory Visit: Payer: Medicare Other | Attending: Family | Admitting: Family

## 2017-12-03 VITALS — BP 156/80 | HR 96 | Resp 18 | Ht 61.0 in | Wt 221.1 lb

## 2017-12-03 DIAGNOSIS — G4733 Obstructive sleep apnea (adult) (pediatric): Secondary | ICD-10-CM | POA: Insufficient documentation

## 2017-12-03 DIAGNOSIS — F329 Major depressive disorder, single episode, unspecified: Secondary | ICD-10-CM | POA: Diagnosis not present

## 2017-12-03 DIAGNOSIS — Z79899 Other long term (current) drug therapy: Secondary | ICD-10-CM | POA: Diagnosis not present

## 2017-12-03 DIAGNOSIS — E1122 Type 2 diabetes mellitus with diabetic chronic kidney disease: Secondary | ICD-10-CM

## 2017-12-03 DIAGNOSIS — Z8249 Family history of ischemic heart disease and other diseases of the circulatory system: Secondary | ICD-10-CM | POA: Diagnosis not present

## 2017-12-03 DIAGNOSIS — Z881 Allergy status to other antibiotic agents status: Secondary | ICD-10-CM | POA: Insufficient documentation

## 2017-12-03 DIAGNOSIS — G2581 Restless legs syndrome: Secondary | ICD-10-CM | POA: Diagnosis not present

## 2017-12-03 DIAGNOSIS — K219 Gastro-esophageal reflux disease without esophagitis: Secondary | ICD-10-CM | POA: Insufficient documentation

## 2017-12-03 DIAGNOSIS — I110 Hypertensive heart disease with heart failure: Secondary | ICD-10-CM | POA: Insufficient documentation

## 2017-12-03 DIAGNOSIS — Z885 Allergy status to narcotic agent status: Secondary | ICD-10-CM | POA: Insufficient documentation

## 2017-12-03 DIAGNOSIS — I10 Essential (primary) hypertension: Secondary | ICD-10-CM

## 2017-12-03 DIAGNOSIS — Z880 Allergy status to penicillin: Secondary | ICD-10-CM | POA: Diagnosis not present

## 2017-12-03 DIAGNOSIS — G4700 Insomnia, unspecified: Secondary | ICD-10-CM | POA: Diagnosis not present

## 2017-12-03 DIAGNOSIS — E1151 Type 2 diabetes mellitus with diabetic peripheral angiopathy without gangrene: Secondary | ICD-10-CM | POA: Diagnosis not present

## 2017-12-03 DIAGNOSIS — G8929 Other chronic pain: Secondary | ICD-10-CM | POA: Insufficient documentation

## 2017-12-03 DIAGNOSIS — Z7982 Long term (current) use of aspirin: Secondary | ICD-10-CM | POA: Diagnosis not present

## 2017-12-03 DIAGNOSIS — I5032 Chronic diastolic (congestive) heart failure: Secondary | ICD-10-CM | POA: Diagnosis present

## 2017-12-03 DIAGNOSIS — E039 Hypothyroidism, unspecified: Secondary | ICD-10-CM | POA: Insufficient documentation

## 2017-12-03 DIAGNOSIS — N183 Chronic kidney disease, stage 3 unspecified: Secondary | ICD-10-CM

## 2017-12-03 DIAGNOSIS — F419 Anxiety disorder, unspecified: Secondary | ICD-10-CM | POA: Insufficient documentation

## 2017-12-03 DIAGNOSIS — M109 Gout, unspecified: Secondary | ICD-10-CM | POA: Diagnosis not present

## 2017-12-03 DIAGNOSIS — Z794 Long term (current) use of insulin: Secondary | ICD-10-CM | POA: Insufficient documentation

## 2017-12-03 DIAGNOSIS — M1990 Unspecified osteoarthritis, unspecified site: Secondary | ICD-10-CM | POA: Diagnosis not present

## 2017-12-03 DIAGNOSIS — Z9989 Dependence on other enabling machines and devices: Secondary | ICD-10-CM

## 2017-12-03 DIAGNOSIS — E785 Hyperlipidemia, unspecified: Secondary | ICD-10-CM | POA: Diagnosis not present

## 2017-12-03 DIAGNOSIS — M549 Dorsalgia, unspecified: Secondary | ICD-10-CM | POA: Insufficient documentation

## 2017-12-03 DIAGNOSIS — I2510 Atherosclerotic heart disease of native coronary artery without angina pectoris: Secondary | ICD-10-CM | POA: Diagnosis present

## 2017-12-03 DIAGNOSIS — I890 Lymphedema, not elsewhere classified: Secondary | ICD-10-CM | POA: Insufficient documentation

## 2017-12-03 DIAGNOSIS — K449 Diaphragmatic hernia without obstruction or gangrene: Secondary | ICD-10-CM | POA: Diagnosis not present

## 2017-12-03 NOTE — Progress Notes (Signed)
Patient ID: [name], female    DOB: January 19, 1946, 72 y.o.   MRN: [id number]  HPI  [name] is a 72 y/o female with a history of anxiety, CAD, depression, DM, GERD, gout, hyperlipidemia, HTN, hypothyroidism, obstructive sleep apnea (CPAP), PVD, NSVT and chronic heart failure.   Echo report from 07/17/17 reviewed and showed an EF of 60-65%. Reviewed echo report from 07/26/16 which showed an EF of 60-65%.  Cardiac catheterization done 10/01/17 and showed:  1: Moderate to severe single-vessel coronary artery disease with 50% proximal/mid LAD disease as well as 80% ostial D1 stenosis.  Both the LAD and diagonal branches are relatively small.    2: Large, dominant left circumflex and small nondominant right coronary artery without significant coronary artery disease.   3: Moderately elevated left ventricular filling pressure, consistent with diastolic heart failure.  Admitted 10/17/17 to have loop recorder placed.   She presents today for a follow-up visit with a chief complaint of moderate fatigue upon minimal exertion. She describes this as chronic in nature having been present for several years. She has associated shortness of breath, light-headedness, pedal edema, palpitations and chronic back pain along with this. She denies any difficulty sleeping, abdominal distention, chest pain, wheezing, cough or weight gain. She missed taking her diuretic one day last week and gained 5 pounds overnight. That weight has mostly come back off.   Past Medical History:  Diagnosis Date  . Anginal pain ([place])   . Anxiety   . Arthritis   . CHF (congestive heart failure) ([place])   . Chronic back pain    stenosis.degenerative disc,some scoliosis  . Constipation    takes Stool Softener daily  . Coronary artery disease   . Depression    takes Cymbalta daily  . Diabetes mellitus    Type 2 diabetic. Average fasting blood sugar runs high 170-200  . Diastolic CHF ([place]) 61/95/0932   Per patient, diagnosed in 2018   . Diverticulosis   . E coli infection   . GERD (gastroesophageal reflux disease)    takes Nexium daily  . Headache   . Headache 02/16/2016  . Hemorrhoids   . History of colon polyps    benign  . History of gout    doesn't take any meds  . History of hiatal hernia   . History of vertigo    doesn't take any meds  . Hyperlipidemia    takes Praluent daily  . Hypertension    currently BP medications are on hold   . Hypothyroidism    takes Synthroid daily  . Insomnia    takes Restoril nightly  . Joint pain   . Joint swelling   . Muscle spasm    takes Robaxin as needed  . NSVT (nonsustained ventricular tachycardia) ([place])   . OSA on CPAP   . Periodic heart flutter   . Peripheral edema    takes LAsix as needed  . Peripheral vascular disease ([place])    AAA as stated per pt / was just discovered and pt states has not been referred to vascular MD   . Restless leg    takes Requip daily  . Rosacea   . Sleep apnea   . Urinary incontinence   . Urinary urgency   . Varicose veins   . Weakness    numbness and tingling.   Past Surgical History:  Procedure Laterality Date  . ABDOMINAL HYSTERECTOMY     with BSo  . CARDIAC CATHETERIZATION  2013  Normal  . CARPAL TUNNEL RELEASE Bilateral   . CATARACT EXTRACTION, BILATERAL    . CHOLECYSTECTOMY    . COLONOSCOPY    . DIAGNOSTIC LAPAROSCOPY     multiple times  . DILATION AND CURETTAGE OF UTERUS    . ESOPHAGOGASTRODUODENOSCOPY (EGD) WITH PROPOFOL N/A 11/01/2014   Procedure: ESOPHAGOGASTRODUODENOSCOPY (EGD) WITH PROPOFOL;  Surgeon: Hulen Luster, MD;  Location[facility] ENDOSCOPY;  Service: Gastroenterology;  Laterality: N/A;  . HARDWARE REMOVAL Left 10/11/2016   Procedure: HARDWARE REMOVAL-LEFT RADIUS;  Surgeon: Lovell Sheehan, MD;  Location: [facility] ORS;  Service: Orthopedics;  Laterality: Left;  Left Radius Wrist   . IMPLANTABLE CONTACT LENS IMPLANTATION     bilateral  . LAMINECTOMY  11/13/2015  . LEFT HEART CATH AND CORONARY ANGIOGRAPHY Left  10/01/2017   Procedure: LEFT HEART CATH AND CORONARY ANGIOGRAPHY;  Surgeon: Nelva Bush, MD;  Location: [place] CV LAB;  Service: Cardiovascular;  Laterality: Left;  . LOOP RECORDER INSERTION N/A 10/17/2017   Procedure: LOOP RECORDER INSERTION;  Surgeon: Deboraha Sprang, MD;  Location: [place] CV LAB;  Service: Cardiovascular;  Laterality: N/A;  . LUMBAR FUSION  11/2015  . LUMBAR WOUND DEBRIDEMENT N/A 12/02/2015   Procedure: WOUND Exploration;  Surgeon: Consuella Lose, MD;  Location: [place];  Service: Neurosurgery;  Laterality: N/A;  . OPEN REDUCTION INTERNAL FIXATION (ORIF) DISTAL RADIAL FRACTURE Left 08/29/2016   Procedure: OPEN REDUCTION INTERNAL FIXATION (ORIF) DISTAL RADIAL FRACTURE;  Surgeon: Lovell Sheehan, MD;  Location: [facility] ORS;  Service: Orthopedics;  Laterality: Left;  . PICC LINE PLACE PERIPHERAL ([place] HX)     right upper arm   . ROTATOR CUFF REPAIR Left   . SAVORY DILATION N/A 11/01/2014   Procedure: SAVORY DILATION;  Surgeon: Hulen Luster, MD;  Location: [facility] ENDOSCOPY;  Service: Gastroenterology;  Laterality: N/A;  . TONSILLECTOMY    . TRIGGER FINGER RELEASE Bilateral    Family History  Problem Relation Age of Onset  . Heart disease Mother   . Breast cancer Mother 62  . Heart disease Father   . Heart attack Sister   . Heart disease Sister   . Heart disease Brother    Social History   Tobacco Use  . Smoking status: Never Smoker  . Smokeless tobacco: Never Used  Substance Use Topics  . Alcohol use: No   Allergies  Allergen Reactions  . Reglan [Metoclopramide] Diarrhea  . Zetia [Ezetimibe] Diarrhea  . Ceftin [Cefuroxime Axetil] Diarrhea  . Codeine Rash       . Penicillins Rash and Other (See Comments)    Has patient had a PCN reaction causing immediate rash, facial/tongue/throat swelling, SOB or lightheadedness with hypotension: NO Has patient had a PCN reaction causing severe rash involving mucus membranes or skin necrosis: NO Has patient had a  PCN retioion that required hospitalization NO Has patient had a PCN reaction occurring within the last 10 years: NO If all of the above answers are "NO", then may proceed with Cephalosporin use.  . Vicodin [Hydrocodone-Acetaminophen] Other (See Comments)    passes out   Prior to Admission medications   Medication Sig Start Date End Date Taking? Authorizing Provider  acetaminophen (TYLENOL) 650 MG CR tablet Take 650 mg by mouth 2 (two) times daily.   Yes [provider]  albuterol (PROVENTIL HFA;VENTOLIN HFA) 108 (90 Base) MCG/ACT inhaler Inhale 2 puffs into the lungs every 6 (six) hours as needed for wheezing or shortness of breath. 01/09/17  Yes Alisa Graff, FNP  Alirocumab (PRALUENT) 150 MG/ML SOPN Inject 150 mg into the skin every 14 (fourteen) days. 07/27/17  Yes Bettey Costa, MD  aspirin 81 MG tablet Take 81 mg by mouth at bedtime.    Yes [provider]  atenolol (TENORMIN) 25 MG tablet Take 12.5 mg by mouth daily.    Yes [provider]  azelastine (ASTELIN) 0.1 % nasal spray Place 2 sprays into both nostrils at bedtime as needed for rhinitis.    Yes [provider]  Cholecalciferol (VITAMIN D) 2000 units tablet Take 2,000 Units by mouth daily.   Yes [provider]  gabapentin (NEURONTIN) 300 MG capsule Take 300 mg by mouth 2 (two) times daily.   Yes [provider]  hyoscyamine (LEVSIN SL) 0.125 MG SL tablet Place 2 tablets (0.25 mg total) under the tongue every 4 (four) hours as needed for cramping. 06/25/16  Yes Theodoro Grist, MD  insulin aspart (NOVOLOG) 100 UNIT/ML FlexPen Inject 10-16 Units into the skin See admin instructions. 10 units with breakfast, 10 units with lunch, 16 units at dinner, >200 increase by 2 units. 03/01/15 12/04/18 Yes [provider]  insulin glargine (LANTUS) 100 UNIT/ML injection Inject 32 Units into the skin every morning.    Yes [provider]  ipratropium-albuterol (DUONEB) 0.5-2.5  (3) MG/3ML SOLN Take 3 mLs by nebulization every 6 (six) hours as needed. Patient taking differently: Take 3 mLs by nebulization every 6 (six) hours as needed (for shortness of breath/wheezing).  07/27/17  Yes Bettey Costa, MD  levothyroxine (SYNTHROID, LEVOTHROID) 75 MCG tablet Take 75 mcg by mouth daily before breakfast.  11/24/12  Yes [provider]  nitroGLYCERIN (NITROSTAT) 0.4 MG SL tablet Place 1 tablet (0.4 mg total) under the tongue every 5 (five) minutes as needed for chest pain. 03/06/16 12/04/18 Yes Gollan, Kathlene November, MD  ondansetron (ZOFRAN) 4 MG tablet Take 4 mg by mouth every 8 (eight) hours as needed for nausea or vomiting.  07/19/17  Yes [provider]  pantoprazole (PROTONIX) 40 MG tablet Take 40 mg by mouth 2 (two) times daily before a meal.   Yes [provider]  potassium chloride SA (K-DUR,KLOR-CON) 20 MEQ tablet Take 1 tablet (20 mEq total) by mouth daily. Patient taking differently: Take 40 mEq by mouth daily.  06/13/17  Yes Minna Merritts, MD  ranolazine (RANEXA) 1000 MG SR tablet Take 1 tablet (1,000 mg total) by mouth 2 (two) times daily. 06/13/17  Yes Gollan, Kathlene November, MD  ropinirole (REQUIP) 5 MG tablet Take 5 mg by mouth 2 (two) times daily.    Yes [provider]  temazepam (RESTORIL) 30 MG capsule Take 30 mg by mouth at bedtime.   Yes [provider]  torsemide (DEMADEX) 20 MG tablet Take 40 mg by mouth daily.    Yes [provider]  traMADol (ULTRAM) 50 MG tablet Take 50 mg by mouth every 6 (six) hours as needed for moderate pain or severe pain.   Yes [provider]    Review of Systems  Constitutional: Positive for fatigue. Negative for appetite change.  HENT: Negative for congestion, postnasal drip and sore throat.   Eyes: Negative.   Respiratory: Positive for shortness of breath. Negative for cough, chest tightness and wheezing.   Cardiovascular: Positive for palpitations and leg swelling. Negative  for chest pain.  Gastrointestinal: Negative for abdominal distention and abdominal pain.  Endocrine: Negative.   Genitourinary: Negative.   Musculoskeletal: Positive for back pain (going to pain  clinic). Negative for neck pain.  Skin: Negative.   Allergic/Immunologic: Negative.   Neurological: Positive for light-headedness (with low blood sugar). Negative for dizziness.  Hematological: Negative for adenopathy. Bruises/bleeds easily.  Psychiatric/Behavioral: Negative for dysphoric mood and sleep disturbance (sleeping with CPAP). The patient is not nervous/anxious.    Vitals:   12/03/17 1037  BP: (!) 156/80  Pulse: 96  Resp: 18  SpO2: 99%  Weight: 221 lb 2 oz (100.3 kg)  Height: 5\' 1"  (1.549 m)   Wt Readings from Last 3 Encounters:  12/03/17 221 lb 2 oz (100.3 kg)  10/15/17 223 lb 4 oz (101.3 kg)  10/01/17 226 lb 3.1 oz (102.6 kg)   Lab Results  Component Value Date   CREATININE 1.30 (H) 10/09/2017   CREATININE 1.36 (H) 09/26/2017   CREATININE 0.93 07/27/2017    Physical Exam  Constitutional: She is oriented to person, place, and time. She appears well-developed and well-nourished.  HENT:  Head: Normocephalic and atraumatic.  Neck: Normal range of motion. Neck supple. No JVD present.  Cardiovascular: Normal rate and regular rhythm.  Pulmonary/Chest: Effort normal. She has no wheezes. She has no rales.  Abdominal: Soft. She exhibits no distension. There is no tenderness.  Musculoskeletal: She exhibits edema (trace pitting edema around both ankles). She exhibits no tenderness.  Neurological: She is alert and oriented to person, place, and time.  Skin: Skin is warm and dry.  Psychiatric: She has a normal mood and affect. Her behavior is normal. Thought content normal.  Nursing note and vitals reviewed.  Assessment & Plan:  1: Chronic heart failure with preserved ejection fraction- - NYHA class III - euvolemic today - weighing daily; Reminded to call for an overnight  weight gain of >2 pounds or a weekly weight gain of >5 pounds - weight down 5 pounds from last visit here 6 months ago - missed taking her diuretic one day last week with resultant 5 pound weight gain; that weight has mostly come back off - uses lite salt; Discussed the importance of closely following a 2000mg  sodium diet and not adding any salt to her food - saw cardiologist Rockey Situ) 07/30/17 - saw EP Caryl Comes) 10/15/17 - maintain fluid intake to 40-60 ounces daily - saw pulmonologist Raul Del) 11/29/17 - PharmD reconciled medications with the patient  2: HTN- - BP mildly elevated - she is now taking atenolol - saw PCP Sabra Heck) 10/23/17 - BMP on 10/16/17 [facility]) reviewed and showed sodium 146, potassium 3.9, creatinine 1.3 and GFR 40   3: Obstructive sleep apnea- - wearing CPAP nightly  4: Lymphedema- - does elevate her legs but edema persists - unable to wear compression socks as she's unable to bend over very far due to hardware in her back - wearing compression boots twice daily with good reduction in edema - limited in exercise due to her shortness of breath & back surgery  5: Diabetes- - fasting glucose at home this morning was 130 - A1c on 07/27/17 was 5.8%  Patient did not bring her medications nor a list. Each medication was verbally reviewed with the patient and she was encouraged to bring the bottles to every visit to confirm accuracy of list.  Return in 6 months or sooner for any questions/problems before then.

## 2017-12-03 NOTE — Patient Instructions (Signed)
Continue weighing daily and call for an overnight weight gain of > 2 pounds or a weekly weight gain of >5 pounds. 

## 2017-12-04 ENCOUNTER — Encounter: Payer: Self-pay | Admitting: Family

## 2017-12-23 ENCOUNTER — Ambulatory Visit (INDEPENDENT_AMBULATORY_CARE_PROVIDER_SITE_OTHER): Payer: Medicare Other | Admitting: *Deleted

## 2017-12-23 DIAGNOSIS — R55 Syncope and collapse: Secondary | ICD-10-CM | POA: Diagnosis not present

## 2017-12-23 NOTE — Progress Notes (Signed)
Carelink Summary Report / Loop Recorder 

## 2017-12-26 DIAGNOSIS — Z8619 Personal history of other infectious and parasitic diseases: Secondary | ICD-10-CM | POA: Insufficient documentation

## 2018-01-05 DIAGNOSIS — I471 Supraventricular tachycardia: Secondary | ICD-10-CM | POA: Insufficient documentation

## 2018-01-05 DIAGNOSIS — I4719 Other supraventricular tachycardia: Secondary | ICD-10-CM | POA: Insufficient documentation

## 2018-01-05 NOTE — Progress Notes (Signed)
Cardiology Office Note  Date:  01/06/2018   ID:  [name], DOB 1945/05/02, MRN [id number]  PCP:  Rusty Aus, MD   Chief Complaint  Patient presents with  . other    2 mo  follow up. Chest pain when stopped Ranexa, nitro resolved and restarted Ranexa. Medications reviewed verbally.     HPI:  72 year-old woman with  CAD,  cardiac catheterization in 2009 and June 2013, moderate LAD, diagonal and RCA disease, Obesity,  diabetes,  hyperlipidemia with statin intolerance, on praluent obstructive sleep apnea on CPAP,  Tachycardia episodes,  previous leg edema,  seen in the hospital for severe hypertension and chest pain,  EF 60% in 07/2016 who presents for routine followup of her coronary artery disease and diastolic CHF  Recent work-up and studies detailed below reviewed with her Cath: 10/01/2017 1. Relatively small LAD with moderate mid vessel disease. 2. Small D1 with 80% ostial/proximal stenosis. 3. Large, dominant LCx without significant disease. 4. Small, non-dominant RCA. 5. Moderately elevated LVEDP. RECOMMENDATIONS: 1. Medical therapy for LAD/diagonal disease.  syncope atrial tachycardia with documented  Pauses.  It was presumed that both events occurred while she was sleeping. --loop monitor placed  Dr. Saunders Revel had concern for infiltrative pathology myeloma panel was negative By report amyloid SPECT scan is negative but not in the computer  Stopped ranexa,  Chest pain came back Back on ranexa Cutting in 1/2 , take 500 twice a day  Needs hand surgery on the left, Dupuytren's contracture  Uses her CPAP at night Periods of Extreme fatigue, unable to do anything Has neighbor that helps her, husband at home very much as he is a trucker  Chronic back pain, hip She feels some of her shortness of breath is secondary to chronic elevation of the right anterior hemidiaphragm.  This presented after shoulder surgery  Weight high, unable to lose weight through  exercise No access to swimming pool  Has disability placard Has cramps in her legs and feet Taking ranexa 1/2 pill BID, denies any anginal symptoms  On praluent Total chol 153, LDL 59 HBA1C 6.4  Atrial fib on watch, but looks like NSR in office today  EKG personally reviewed by myself on todays visit Shows normal sinus rhythm rate 84 bpm no significant ST or T-wave changes  Other past medical history reviewed hospitalization for acute diverticulitis July 27 2017 On ABX cipro and flagyl, has diarrhea over the past several days  Hospitalization for chest pain July 17, 2017 Stress test showing no ischemia, ejection fraction 85% Significant failure to thrive/weakness  Previous fall left wrist fracture, required placement of plate Thin ligament injury after moving her car seat  Completed a tendon transfer  Following anesthesia developed pneumonia, longer hospital stay   Episode of pyelonephritis May 2018 Back in the hospital June 7169 with diastolic CHF, shortness of breath Also with bronchitis requiring steroids She was at the beach eating out every night, had significant weight gain and was not taking Lasix  Suffered a fall resulting in Left wrist fracture, required a placement of plate Then ligamental injury after moving her car seat Reports she needs a tendon transfer  Dry weight 218 here, At home 219  Had back surgery 11/14/2015 Went to rehab Got postop infection, ecoli, sepsis crp 30, sed rate 130 Temp in October 2017 Drain placed 02/15/2016, rare ecoli on ABX, picc line in place Still with significant back pain No fevers,  Insulin has been increased.  Previous blood work  before praluent, total cholesterol 238, triglycerides 462, hemoglobin A1c 7.0 She was unable to tolerate Vytorin as well as other statins  Admission on October 1, discharge on November 14 2010. She ruled out by cardiac enzymes, chest CT was negative for PE. She had a stress test that showed no  ischemia. Her medications were adjusted. CT Scan did show fatty infiltration of the liver, granulomatous lesion of the left lower lobe  cardiac catheterization 07/25/2011 for chest pain  This showed left dominant coronary system with moderate mid LAD, proximal diagonal #1 and proximal RCA disease all estimated at 50%, normal LV systolic function.  PMH:   has a past medical history of Anginal pain ([place]), Anxiety, Arthritis, CHF (congestive heart failure) ([place]), Chronic back pain, Constipation, Coronary artery disease, Depression, Diabetes mellitus, Diastolic CHF ([place]) (62/70/3500), Diverticulosis, E coli infection, GERD (gastroesophageal reflux disease), Headache, Headache (02/16/2016), Hemorrhoids, History of colon polyps, History of gout, History of hiatal hernia, History of vertigo, Hyperlipidemia, Hypertension, Hypothyroidism, Insomnia, Joint pain, Joint swelling, Muscle spasm, NSVT (nonsustained ventricular tachycardia) (HCC), OSA on CPAP, Periodic heart flutter, Peripheral edema, Peripheral vascular disease ([place]), Restless leg, Rosacea, Sleep apnea, Urinary incontinence, Urinary urgency, Varicose veins, and Weakness.  PSH:    Past Surgical History:  Procedure Laterality Date  . ABDOMINAL HYSTERECTOMY     with BSo  . CARDIAC CATHETERIZATION  2013   Normal  . CARPAL TUNNEL RELEASE Bilateral   . CATARACT EXTRACTION, BILATERAL    . CHOLECYSTECTOMY    . COLONOSCOPY    . DIAGNOSTIC LAPAROSCOPY     multiple times  . DILATION AND CURETTAGE OF UTERUS    . ESOPHAGOGASTRODUODENOSCOPY (EGD) WITH PROPOFOL N/A 11/01/2014   Procedure: ESOPHAGOGASTRODUODENOSCOPY (EGD) WITH PROPOFOL;  Surgeon: Hulen Luster, MD;  Location: [facility]COPY;  Service: Gastroenterology;  Laterality: N/A;  . HARDWARE REMOVAL Left 10/11/2016   Procedure: HARDWARE REMOVAL-LEFT RADIUS;  Surgeon: Lovell Sheehan, MD;  Location: [facility] ORS;  Service: Orthopedics;  Laterality: Left;  Left Radius Wrist   . IMPLANTABLE CONTACT LENS  IMPLANTATION     bilateral  . LAMINECTOMY  11/13/2015  . LEFT HEART CATH AND CORONARY ANGIOGRAPHY Left 10/01/2017   Procedure: LEFT HEART CATH AND CORONARY ANGIOGRAPHY;  Surgeon: Nelva Bush, MD;  Location: [place] CV LAB;  Service: Cardiovascular;  Laterality: Left;  . LOOP RECORDER INSERTION N/A 10/17/2017   Procedure: LOOP RECORDER INSERTION;  Surgeon: Deboraha Sprang, MD;  Location: [place] [facility];  Service: Cardiovascular;  Laterality: N/A;  . LUMBAR FUSION  11/2015  . LUMBAR WOUND DEBRIDEMENT N/A 12/02/2015   Procedure: WOUND Exploration;  Surgeon: Consuella Lose, MD;  Location: [place];  Service: Neurosurgery;  Laterality: N/A;  . OPEN REDUCTION INTERNAL FIXATION (ORIF) DISTAL RADIAL FRACTURE Left 08/29/2016   Procedure: OPEN REDUCTION INTERNAL FIXATION (ORIF) DISTAL RADIAL FRACTURE;  Surgeon: Lovell Sheehan, MD;  Location: [facility] ORS;  Service: Orthopedics;  Laterality: Left;  . PICC LINE PLACE PERIPHERAL ([place] HX)     right upper arm   . ROTATOR CUFF REPAIR Left   . SAVORY DILATION N/A 11/01/2014   Procedure: SAVORY DILATION;  Surgeon: Hulen Luster, MD;  Location: [facility] END[facility];  Service: Gastroenterology;  Laterality: N/A;  . TONSILLECTOMY    . TRIGGER FINGER RELEASE Bilateral     Current Outpatient Medications  Medication Sig Dispense Refill  . acetaminophen (TYLENOL) 650 MG CR tablet Take 650 mg by mouth 2 (two) times daily.    [name] albuterol (PROVENTIL HFA;VENTOLIN HFA) 108 (90  Base) MCG/ACT inhaler Inhale 2 puffs into the lungs every 6 (six) hours as needed for wheezing or shortness of breath. 3 Inhaler 3  . Alirocumab (PRALUENT) 150 MG/ML SOPN Inject 150 mg into the skin every 14 (fourteen) days.    [name] aspirin 81 MG tablet Take 81 mg by mouth at bedtime.     [name] atenolol (TENORMIN) 25 MG tablet Take 12.5 mg by mouth daily.     [name] azelastine (ASTELIN) 0.1 % nasal spray Place 2 sprays into both nostrils at bedtime as needed for rhinitis.     . Cholecalciferol (VITAMIN D)  2000 units tablet Take 2,000 Units by mouth daily.    [name] gabapentin (NEURONTIN) 300 MG capsule Take 300 mg by mouth 2 (two) times daily.    . hyoscyamine (LEVSIN SL) 0.125 MG SL tablet Place 2 tablets (0.25 mg total) under the tongue every 4 (four) hours as needed for cramping. 30 tablet 0  . insulin aspart (NOVOLOG) 100 UNIT/ML FlexPen Inject 10-16 Units into the skin See admin instructions. 10 units with breakfast, 10 units with lunch, 16 units at dinner, >200 increase by 2 units.    . insulin glargine (LANTUS) 100 UNIT/ML injection Inject 32 Units into the skin every morning.     [name] ipratropium-albuterol (DUONEB) 0.5-2.5 (3) MG/3ML SOLN Take 3 mLs by nebulization every 6 (six) hours as needed. (Patient taking differently: Take 3 mLs by nebulization every 6 (six) hours as needed (for shortness of breath/wheezing). )    . JARDIANCE 25 MG TABS tablet     . levothyroxine (SYNTHROID, LEVOTHROID) 75 MCG tablet Take 75 mcg by mouth daily before breakfast.     . nitroGLYCERIN (NITROSTAT) 0.4 MG SL tablet Place 1 tablet (0.4 mg total) under the tongue every 5 (five) minutes as needed for chest pain. 25 tablet 3  . ondansetron (ZOFRAN) 4 MG tablet Take 4 mg by mouth every 8 (eight) hours as needed for nausea or vomiting.     . pantoprazole (PROTONIX) 40 MG tablet Take 40 mg by mouth 2 (two) times daily before a meal.    . potassium chloride SA (K-DUR,KLOR-CON) 20 MEQ tablet Take 1 tablet (20 mEq total) by mouth daily. (Patient taking differently: Take 40 mEq by mouth daily. ) 90 tablet 3  . ranolazine (RANEXA) 1000 MG SR tablet Take 1 tablet (1,000 mg total) by mouth 2 (two) times daily. 180 tablet 3  . ropinirole (REQUIP) 5 MG tablet Take 5 mg by mouth 2 (two) times daily.     . temazepam (RESTORIL) 30 MG capsule Take 30 mg by mouth at bedtime.    . torsemide (DEMADEX) 20 MG tablet Take 40 mg by mouth daily.     . traMADol (ULTRAM) 50 MG tablet Take 50 mg by mouth every 6 (six) hours as needed for moderate  pain or severe pain.     No current facility-administered medications for this visit.    Facility-Administered Medications Ordered in Other Visits  Medication Dose Route Frequency Provider Last Rate Last Dose  . dexamethasone (DECADRON) injection 10 mg  10 mg Intravenous Once Lovell Sheehan, MD         Allergies:   Reglan [metoclopramide]; Zetia [ezetimibe]; Ceftin [cefuroxime axetil]; Codeine; Penicillins; and Vicodin [hydrocodone-acetaminophen]   Social History:  The patient  reports that she has never smoked. She has never used smokeless tobacco. She reports that she does not drink alcohol or use drugs.   Family History:   family history includes  Breast cancer (age of onset: 99) in her mother; Heart attack in her sister; Heart disease in her brother, father, mother, and sister.    Review of Systems: Review of Systems  Constitutional: Negative.        Weight gain  Respiratory: Positive for shortness of breath.   Cardiovascular: Positive for chest pain.  Gastrointestinal: Negative.   Musculoskeletal: Positive for joint pain.       Leg weakness  Neurological: Negative.   Psychiatric/Behavioral: Negative.   All other systems reviewed and are negative.    PHYSICAL EXAM: VS:  BP (!) 142/78 (BP Location: Left Arm, Patient Position: Sitting, Cuff Size: Normal)   Pulse 84   Ht 5\' 1"  (1.549 m)   Wt 215 lb (97.5 kg)   BMI 40.62 kg/m  , BMI Body mass index is 40.62 kg/m. Constitutional:  oriented to person, place, and time. No distress.  Obese HENT:  Head: Grossly normal Eyes:  no discharge. No scleral icterus.  Neck: No JVD, no carotid bruits  Cardiovascular: Regular rate and rhythm, no murmurs appreciated Pulmonary/Chest: Clear to auscultation bilaterally, no wheezes or rails Abdominal: Soft.  no distension.  no tenderness.  Musculoskeletal: Normal range of motion Neurological:  normal muscle tone. Coordination normal. No atrophy Skin: Skin warm and dry Psychiatric:  normal affect, pleasant   Recent Labs: 07/17/2017: B Natriuretic Peptide 29.0 07/27/2017: ALT 15 09/26/2017: Hemoglobin 12.0; Platelets 255 10/09/2017: BUN 21; Creatinine, Ser 1.30; Potassium 4.1; Sodium 142    Lipid Panel Lab Results  Component Value Date   CHOL 163 07/26/2015   HDL 32 (L) 07/26/2015   LDLCALC 94 07/26/2015   TRIG 187 (H) 07/26/2015      Wt Readings from Last 3 Encounters:  01/06/18 215 lb (97.5 kg)  12/03/17 221 lb 2 oz (100.3 kg)  10/15/17 223 lb 4 oz (101.3 kg)      ASSESSMENT AND PLAN:  Coronary artery disease involving native coronary artery of native heart without angina pectoris - Plan: EKG 12-Lead ACE inhibitor held by primary care presumably for low blood pressure Recent cardiac catheterization, medical management recommended She is back on Ranexa, had chest pain when she held the medication  Essential hypertension Blood pressure stable on today's visit No changes made  Mixed hyperlipidemia On praluent 150 mg, every two weeks,  Zetia 10 mg daily Numbers close to goal  Tachycardia Symptoms improved on beta-blocker, no changes made  Chronic diastolic CHF  Leg swelling improved with lymphedema compression pumps Continues to take torsemide daily Stable renal function  Type 2 diabetes mellitus with other circulatory complication, with long-term current use of insulin (HCC) Unable to lose weight, chronic pain, deconditioning On medication changes, numbers have improved  OSA on CPAP Tolerated CPAP  Long time spent discussing recent studies including cardiac catheterization, images pulled up, amyloid SPECT study, recent lab work, Banker of her loop monitor Very anxious concerning her health  Total encounter time more than 25 minutes  Greater than 50% was spent in counseling and coordination of care with the patient   Disposition:   F/U  12 months   Orders Placed This Encounter  Procedures  . EKG 12-Lead     Signed, Esmond Plants,  M.D., Ph.D. 01/06/2018  [place], [place]

## 2018-01-06 ENCOUNTER — Ambulatory Visit (INDEPENDENT_AMBULATORY_CARE_PROVIDER_SITE_OTHER): Payer: Medicare Other | Admitting: Cardiovascular Disease

## 2018-01-06 ENCOUNTER — Encounter: Payer: Self-pay | Admitting: Cardiovascular Disease

## 2018-01-06 VITALS — BP 142/78 | HR 84 | Ht 61.0 in | Wt 215.0 lb

## 2018-01-06 DIAGNOSIS — Z794 Long term (current) use of insulin: Secondary | ICD-10-CM

## 2018-01-06 DIAGNOSIS — E1122 Type 2 diabetes mellitus with diabetic chronic kidney disease: Secondary | ICD-10-CM | POA: Diagnosis not present

## 2018-01-06 DIAGNOSIS — I890 Lymphedema, not elsewhere classified: Secondary | ICD-10-CM | POA: Diagnosis not present

## 2018-01-06 DIAGNOSIS — N183 Chronic kidney disease, stage 3 unspecified: Secondary | ICD-10-CM

## 2018-01-06 DIAGNOSIS — I471 Supraventricular tachycardia: Secondary | ICD-10-CM

## 2018-01-06 DIAGNOSIS — E1159 Type 2 diabetes mellitus with other circulatory complications: Secondary | ICD-10-CM

## 2018-01-06 DIAGNOSIS — I25118 Atherosclerotic heart disease of native coronary artery with other forms of angina pectoris: Secondary | ICD-10-CM | POA: Diagnosis not present

## 2018-01-06 DIAGNOSIS — I200 Unstable angina: Secondary | ICD-10-CM | POA: Diagnosis not present

## 2018-01-06 DIAGNOSIS — I5032 Chronic diastolic (congestive) heart failure: Secondary | ICD-10-CM | POA: Diagnosis not present

## 2018-01-06 DIAGNOSIS — I495 Sick sinus syndrome: Secondary | ICD-10-CM

## 2018-01-06 MED ORDER — ATENOLOL 25 MG PO TABS: 12.5000 mg | ORAL_TABLET | Freq: Every day | ORAL | 4 refills | Status: DC

## 2018-01-06 MED ORDER — POTASSIUM CHLORIDE CRYS ER 20 MEQ PO TBCR: 40.0000 meq | EXTENDED_RELEASE_TABLET | Freq: Every day | ORAL | 3 refills | Status: DC

## 2018-01-06 NOTE — Patient Instructions (Addendum)
We will try to obtain results of the spect scan from 10/30/2017   Medication Instructions:  No changes  If you need a refill on your cardiac medications before your next appointment, please call your pharmacy.    Lab work: No new labs needed   If you have labs (blood work) drawn today and your tests are completely normal, you will receive your results only by: [name] MyChart Message (if you have MyChart) OR . A paper copy in the mail If you have any lab test that is abnormal or we need to change your treatment, we will call you to review the results.   Testing/Procedures: No new testing needed   Follow-Up: At [facility], you and your health needs are our priority.  As part of our continuing mission to provide you with exceptional heart care, we have created designated Provider Care Teams.  These Care Teams include your primary Cardiologist (physician) and Advanced Practice Providers (APPs -  Physician Assistants and Nurse Practitioners) who all work together to provide you with the care you need, when you need it.  . You will need a follow up appointment in 12 months .   Please call our office 2 months in advance to schedule this appointment.    . Providers on your designated Care Team:   . Murray Hodgkins, NP . Christell Faith, PA-C . Marrianne Mood, PA-C  Any Other Special Instructions Will Be Listed Below (If Applicable).  For educational health videos Log in to : [email or web address] Or : [email or web address], password : [email or web address]

## 2018-01-14 DIAGNOSIS — R700 Elevated erythrocyte sedimentation rate: Secondary | ICD-10-CM | POA: Insufficient documentation

## 2018-01-14 DIAGNOSIS — M2550 Pain in unspecified joint: Secondary | ICD-10-CM | POA: Insufficient documentation

## 2018-01-22 DIAGNOSIS — M79641 Pain in right hand: Secondary | ICD-10-CM | POA: Insufficient documentation

## 2018-01-22 DIAGNOSIS — M720 Palmar fascial fibromatosis [Dupuytren]: Secondary | ICD-10-CM | POA: Insufficient documentation

## 2018-01-22 DIAGNOSIS — M6530 Trigger finger, unspecified finger: Secondary | ICD-10-CM | POA: Insufficient documentation

## 2018-01-24 ENCOUNTER — Ambulatory Visit (INDEPENDENT_AMBULATORY_CARE_PROVIDER_SITE_OTHER): Payer: Medicare Other

## 2018-01-24 DIAGNOSIS — R55 Syncope and collapse: Secondary | ICD-10-CM

## 2018-01-27 NOTE — Progress Notes (Signed)
Carelink Summary Report / Loop Recorder 

## 2018-01-28 ENCOUNTER — Ambulatory Visit (INDEPENDENT_AMBULATORY_CARE_PROVIDER_SITE_OTHER): Payer: Medicare Other | Admitting: Internal Medicine

## 2018-01-28 ENCOUNTER — Encounter: Payer: Self-pay | Admitting: Internal Medicine

## 2018-01-28 VITALS — BP 130/69 | HR 76 | Ht 61.0 in | Wt 213.8 lb

## 2018-01-28 DIAGNOSIS — I471 Supraventricular tachycardia: Secondary | ICD-10-CM

## 2018-01-28 DIAGNOSIS — R55 Syncope and collapse: Secondary | ICD-10-CM

## 2018-01-28 DIAGNOSIS — I200 Unstable angina: Secondary | ICD-10-CM | POA: Diagnosis not present

## 2018-01-28 DIAGNOSIS — I5032 Chronic diastolic (congestive) heart failure: Secondary | ICD-10-CM

## 2018-01-28 NOTE — Patient Instructions (Signed)
Medication Instructions:  - Your physician recommends that you continue on your current medications as directed. Please refer to the Current Medication list given to you today.  If you need a refill on your cardiac medications before your next appointment, please call your pharmacy.   Lab work: - none ordered  If you have labs (blood work) drawn today and your tests are completely normal, you will receive your results only by: . MyChart Message (if you have MyChart) OR . A paper copy in the mail If you have any lab test that is abnormal or we need to change your treatment, we will call you to review the results.  Testing/Procedures: - none ordered  Follow-Up: At [facility], you and your health needs are our priority.  As part of our continuing mission to provide you with exceptional heart care, we have created designated Provider Care Teams.  These Care Teams include your primary Cardiologist (physician) and Advanced Practice Providers (APPs -  Physician Assistants and Nurse Practitioners) who all work together to provide you with the care you need, when you need it. . as needed with Dr. Klein.  Any Other Special Instructions Will Be Listed Below (If Applicable). - N/A   

## 2018-01-28 NOTE — Progress Notes (Signed)
Patient Care Team: Rusty Aus, MD as PCP - General (Internal Medicine) Minna Merritts, MD as Consulting Physician (Cardiology)   HPI  [name] is a 72 y.o. female Seen in follow-up for syncope atrial tachycardia with documented pauses.  It was presumed that both events occurred while she was sleeping.  One however is timed at midnight the other at the midday.  The event during the day occurred during nuclear stress testing.  She reminds me that her spells of presyncope/syncope have been associated with exertion and tachypalpitations.  On her apple watch she is seen heart rates at these moments in the 150s but also in the 40s.  She also has a history of exertional chest discomfort for which she underwent CT A FINDINGS of significant coronary disease OM FFR was concerning and LAD FFR was not possible and could not rule out severe stenosis>> LHC See Below   she is also struggling with dyspnea on exertion  For sinus tachycardia, we tried her on beta-blocker; she had not previously be able to tolerate metoprolol succinate.  She is given prescriptions for atenolol, nebivolol and by systolic.  She has tolerated the atenolol well although she is needed to take just 12.5 because of heart rates and blood pressure that were too low  Underwent loop recorder insertion for recurrent syncope 9/19  Interval palpitations but no syncope.  Mostly she is able to become recumbent prior to loss of consciousness.  No changes in dyspnea; activity limited mostly by back pain.  DATE TEST EF   6/19 Echo   55-65 % PA pressure normal  7/19  CTA  FFR < .8 OM and not able to calculate LAD  8/19 LHC  50 %LAD mid/80%D1        Past Medical History:  Diagnosis Date  . Anginal pain ([place])   . Anxiety   . Arthritis   . CHF (congestive heart failure) ([place])   . Chronic back pain    stenosis.degenerative disc,some scoliosis  . Constipation    takes Stool Softener daily  . Coronary artery  disease   . Depression    takes Cymbalta daily  . Diabetes mellitus    Type 2 diabetic. Average fasting blood sugar runs high 170-200  . Diastolic CHF ([place]) 78/29/5621   Per patient, diagnosed in 2018  . Diverticulosis   . E coli infection   . GERD (gastroesophageal reflux disease)    takes Nexium daily  . Headache   . Headache 02/16/2016  . Hemorrhoids   . History of colon polyps    benign  . History of gout    doesn't take any meds  . History of hiatal hernia   . History of vertigo    doesn't take any meds  . Hyperlipidemia    takes Praluent daily  . Hypertension    currently BP medications are on hold   . Hypothyroidism    takes Synthroid daily  . Insomnia    takes Restoril nightly  . Joint pain   . Joint swelling   . Muscle spasm    takes Robaxin as needed  . NSVT (nonsustained ventricular tachycardia) ([place])   . OSA on CPAP   . Periodic heart flutter   . Peripheral edema    takes LAsix as needed  . Peripheral vascular disease ([place])    AAA as stated per pt / was just discovered and pt states has not been referred to vascular MD   .  Restless leg    takes Requip daily  . Rosacea   . Sleep apnea   . Urinary incontinence   . Urinary urgency   . Varicose veins   . Weakness    numbness and tingling.    Past Surgical History:  Procedure Laterality Date  . ABDOMINAL HYSTERECTOMY     with BSo  . CARDIAC CATHETERIZATION  2013   Normal  . CARPAL TUNNEL RELEASE Bilateral   . CATARACT EXTRACTION, BILATERAL    . CHOLECYSTECTOMY    . COLONOSCOPY    . DIAGNOSTIC LAPAROSCOPY     multiple times  . DILATION AND CURETTAGE OF UTERUS    . ESOPHAGOGASTRODUODENOSCOPY (EGD) WITH PROPOFOL N/A 11/01/2014   Procedure: ESOPHAGOGASTRODUODENOSCOPY (EGD) WITH PROPOFOL;  Surgeon: Hulen Luster, MD;  Location: [facility] ENDOS[facility];  Service: Gastroenterology;  Laterality: N/A;  . HARDWARE REMOVAL Left 10/11/2016   Procedure: HARDWARE REMOVAL-LEFT RADIUS;  Surgeon: Lovell Sheehan, MD;   Location: [facility] ORS;  Service: Orthopedics;  Laterality: Left;  Left Radius Wrist   . IMPLANTABLE CONTACT LENS IMPLANTATION     bilateral  . LAMINECTOMY  11/13/2015  . LEFT HEART CATH AND CORONARY ANGIOGRAPHY Left 10/01/2017   Procedure: LEFT HEART CATH AND CORONARY ANGIOGRAPHY;  Surgeon: Nelva Bush, MD;  Location: [place] CV LAB;  Service: Cardiovascular;  Laterality: Left;  . LOOP RECORDER INSERTION N/A 10/17/2017   Procedure: LOOP RECORDER INSERTION;  Surgeon: Deboraha Sprang, MD;  Location: [place] CV LAB;  Service: Cardiovascular;  Laterality: N/A;  . LUMBAR FUSION  11/2015  . LUMBAR WOUND DEBRIDEMENT N/A 12/02/2015   Procedure: WOUND Exploration;  Surgeon: Consuella Lose, MD;  Location: [place];  Service: Neurosurgery;  Laterality: N/A;  . OPEN REDUCTION INTERNAL FIXATION (ORIF) DISTAL RADIAL FRACTURE Left 08/29/2016   Procedure: OPEN REDUCTION INTERNAL FIXATION (ORIF) DISTAL RADIAL FRACTURE;  Surgeon: Lovell Sheehan, MD;  Location: [facility] ORS;  Service: Orthopedics;  Laterality: Left;  . PICC LINE PLACE PERIPHERAL ([place] HX)     right upper arm   . ROTATOR CUFF REPAIR Left   . SAVORY DILATION N/A 11/01/2014   Procedure: SAVORY DILATION;  Surgeon: Hulen Luster, MD;  Location: [facility] ENDOSCOPY;  Service: Gastroenterology;  Laterality: N/A;  . TONSILLECTOMY    . TRIGGER FINGER RELEASE Bilateral     Current Meds  Medication Sig  . acetaminophen (TYLENOL) 650 MG CR tablet Take 650 mg by mouth 2 (two) times daily.  [name] albuterol (PROVENTIL HFA;VENTOLIN HFA) 108 (90 Base) MCG/ACT inhaler Inhale 2 puffs into the lungs every 6 (six) hours as needed for wheezing or shortness of breath.  . Alirocumab (PRALUENT) 150 MG/ML SOPN Inject 150 mg into the skin every 14 (fourteen) days.  [name] aspirin 81 MG tablet Take 81 mg by mouth at bedtime.   [name] atenolol (TENORMIN) 25 MG tablet Take 0.5 tablets (12.5 mg total) by mouth daily.  [name] azelastine (ASTELIN) 0.1 % nasal spray Place 2 sprays into both  nostrils at bedtime as needed for rhinitis.   . Cholecalciferol (VITAMIN D) 2000 units tablet Take 2,000 Units by mouth daily.  [name] gabapentin (NEURONTIN) 300 MG capsule Take 300 mg by mouth 2 (two) times daily.  . hyoscyamine (LEVSIN SL) 0.125 MG SL tablet Place 2 tablets (0.25 mg total) under the tongue every 4 (four) hours as needed for cramping.  . insulin aspart (NOVOLOG) 100 UNIT/ML FlexPen Inject 10-16 Units into the skin See admin instructions. 10 units with breakfast, 10 units with lunch, 16  units at dinner, >200 increase by 2 units.  . insulin glargine (LANTUS) 100 UNIT/ML injection Inject 32 Units into the skin every morning.   [name] ipratropium-albuterol (DUONEB) 0.5-2.5 (3) MG/3ML SOLN Take 3 mLs by nebulization every 6 (six) hours as needed. (Patient taking differently: Take 3 mLs by nebulization every 6 (six) hours as needed (for shortness of breath/wheezing). )  . JARDIANCE 25 MG TABS tablet   . levothyroxine (SYNTHROID, LEVOTHROID) 75 MCG tablet Take 75 mcg by mouth daily before breakfast.   . nitroGLYCERIN (NITROSTAT) 0.4 MG SL tablet Place 1 tablet (0.4 mg total) under the tongue every 5 (five) minutes as needed for chest pain.  [name] ondansetron (ZOFRAN) 4 MG tablet Take 4 mg by mouth every 8 (eight) hours as needed for nausea or vomiting.   . pantoprazole (PROTONIX) 40 MG tablet Take 40 mg by mouth 2 (two) times daily before a meal.  . potassium chloride SA (K-DUR,KLOR-CON) 20 MEQ tablet Take 2 tablets (40 mEq total) by mouth daily.  . ranolazine (RANEXA) 1000 MG SR tablet Take 1 tablet (1,000 mg total) by mouth 2 (two) times daily.  . ropinirole (REQUIP) 5 MG tablet Take 5 mg by mouth 2 (two) times daily.   . temazepam (RESTORIL) 30 MG capsule Take 30 mg by mouth at bedtime.  . torsemide (DEMADEX) 20 MG tablet Take 40 mg by mouth daily.   . traMADol (ULTRAM) 50 MG tablet Take 50 mg by mouth every 6 (six) hours as needed for moderate pain or severe pain.    Allergies  Allergen  Reactions  . Reglan [Metoclopramide] Diarrhea  . Zetia [Ezetimibe] Diarrhea  . Ceftin [Cefuroxime Axetil] Diarrhea  . Codeine Rash       . Penicillins Rash and Other (See Comments)    Has patient had a PCN reaction causing immediate rash, facial/tongue/throat swelling, SOB or lightheadedness with hypotension: NO Has patient had a PCN reaction causing severe rash involving mucus membranes or skin necrosis: NO Has patient had a PCN retioion that required hospitalization NO Has patient had a PCN reaction occurring within the last 10 years: NO If all of the above answers are "NO", then may proceed with Cephalosporin use.  . Vicodin [Hydrocodone-Acetaminophen] Other (See Comments)    passes out      Review of Systems negative except from HPI and PMH  Physical Exam BP 130/69 (BP Location: Left Arm, Patient Position: Sitting, Cuff Size: Large)   Pulse 76   Ht 5\' 1"  (1.549 m)   Wt 213 lb 12 oz (97 kg)   BMI 40.39 kg/m  Well developed and nourished in no acute distress HENT normal Neck supple with JVP-flat Clear Regular rate and rhythm, no murmurs or gallops Abd-soft with active BS No Clubbing cyanosis edema Skin-warm and dry A & Oriented  Grossly normal sensory and motor function  ILR was reviewed.  No pauses.  No tachycardia.  ECG demonstrates sinus at 76 Intervals 14/08/38   Assessment and Plan:  Syncope and exertional lightheadedness  Atrial tachycardia-nonsustained  Obstructive sleep apnea treated  Nocturnal pausing up to 8 seconds  Exertional syncope and lightheadedness  Obesity  Diastolic heart failure  Coronary artery disease with abnormal FFR>> Cath mod LAD/D1 disease   Continues with exertional lightheadedness; no syncope.  No nocturnal pausing.  Euvolemic continue current meds  Without symptoms of ischemia  Continue ASA and ranolazine

## 2018-02-04 ENCOUNTER — Other Ambulatory Visit: Payer: Self-pay | Admitting: Family

## 2018-02-11 LAB — CUP PACEART REMOTE DEVICE CHECK
Date Time Interrogation Session: 20191110130710
Implantable Pulse Generator Implant Date: 20190905

## 2018-02-13 DIAGNOSIS — M1990 Unspecified osteoarthritis, unspecified site: Secondary | ICD-10-CM | POA: Insufficient documentation

## 2018-02-18 DIAGNOSIS — M47816 Spondylosis without myelopathy or radiculopathy, lumbar region: Secondary | ICD-10-CM | POA: Insufficient documentation

## 2018-02-22 ENCOUNTER — Emergency Department: Payer: Medicare Other

## 2018-02-22 ENCOUNTER — Emergency Department
Admission: EM | Admit: 2018-02-22 | Discharge: 2018-02-22 | Disposition: A | Discharge status: Home / Self Care | Payer: Medicare Other | Attending: Emergency Medicine | Admitting: Emergency Medicine

## 2018-02-22 DIAGNOSIS — Z79899 Other long term (current) drug therapy: Secondary | ICD-10-CM | POA: Diagnosis not present

## 2018-02-22 DIAGNOSIS — I110 Hypertensive heart disease with heart failure: Secondary | ICD-10-CM | POA: Insufficient documentation

## 2018-02-22 DIAGNOSIS — K5732 Diverticulitis of large intestine without perforation or abscess without bleeding: Secondary | ICD-10-CM | POA: Insufficient documentation

## 2018-02-22 DIAGNOSIS — R109 Unspecified abdominal pain: Secondary | ICD-10-CM | POA: Diagnosis present

## 2018-02-22 DIAGNOSIS — I509 Heart failure, unspecified: Secondary | ICD-10-CM | POA: Diagnosis not present

## 2018-02-22 LAB — COMPREHENSIVE METABOLIC PANEL
ALT: 16 U/L (ref 0–44)
ANION GAP: 12 (ref 5–15)
AST: 16 U/L (ref 15–41)
Albumin: 4.1 g/dL (ref 3.5–5.0)
Alkaline Phosphatase: 125 U/L (ref 38–126)
BUN: 26 mg/dL — ABNORMAL HIGH (ref 8–23)
CHLORIDE: 101 mmol/L (ref 98–111)
CO2: 28 mmol/L (ref 22–32)
Calcium: 9.4 mg/dL (ref 8.9–10.3)
Creatinine, Ser: 1.32 mg/dL — ABNORMAL HIGH (ref 0.44–1.00)
GFR calc Af Amer: 47 mL/min — ABNORMAL LOW (ref >60)
GFR calc non Af Amer: 40 mL/min — ABNORMAL LOW (ref >60)
GLUCOSE: 175 mg/dL — AB (ref 70–99)
Potassium: 3.6 mmol/L (ref 3.5–5.1)
Sodium: 141 mmol/L (ref 135–145)
Total Bilirubin: 0.5 mg/dL (ref 0.3–1.2)
Total Protein: 7.1 g/dL (ref 6.5–8.1)

## 2018-02-22 LAB — CBC
HEMATOCRIT: 39.9 % (ref 36.0–46.0)
Hemoglobin: 12.5 g/dL (ref 12.0–15.0)
MCH: 27.4 pg (ref 26.0–34.0)
MCHC: 31.3 g/dL (ref 30.0–36.0)
MCV: 87.3 fL (ref 80.0–100.0)
Platelets: 275 10*3/uL (ref 150–400)
RBC: 4.57 MIL/uL (ref 3.87–5.11)
RDW: 15.8 % — ABNORMAL HIGH (ref 11.5–15.5)
WBC: 13.4 10*3/uL — ABNORMAL HIGH (ref 4.0–10.5)
nRBC: 0 % (ref 0.0–0.2)

## 2018-02-22 LAB — URINALYSIS, COMPLETE (UACMP) WITH MICROSCOPIC
Bilirubin Urine: NEGATIVE
Glucose, UA: >=500 mg/dL — AB
Hgb urine dipstick: NEGATIVE
Ketones, ur: NEGATIVE mg/dL
Leukocytes, UA: NEGATIVE
Nitrite: NEGATIVE
Protein, ur: NEGATIVE mg/dL
SQUAMOUS EPITHELIAL / LPF: NONE SEEN (ref 0–5)
Specific Gravity, Urine: 1.022 (ref 1.005–1.030)
pH: 6 (ref 5.0–8.0)

## 2018-02-22 LAB — LIPASE, BLOOD: LIPASE: 37 U/L (ref 11–51)

## 2018-02-22 MED ORDER — CIPROFLOXACIN HCL 500 MG PO TABS
500.0000 mg | ORAL_TABLET | Freq: Once | ORAL | Status: AC
  Administered 2018-02-22: 500 mg via ORAL
  Filled 2018-02-22: qty 1

## 2018-02-22 MED ORDER — MORPHINE SULFATE (PF) 4 MG/ML IV SOLN
4.0000 mg | Freq: Once | INTRAVENOUS | Status: AC
  Administered 2018-02-22: 4 mg via INTRAVENOUS
  Filled 2018-02-22: qty 1

## 2018-02-22 MED ORDER — HYDROCODONE-ACETAMINOPHEN 5-325 MG PO TABS: 1.0000 | ORAL_TABLET | Freq: Four times a day (QID) | ORAL | 0 refills | Status: DC | PRN

## 2018-02-22 MED ORDER — CIPROFLOXACIN HCL 500 MG PO TABS: 500.0000 mg | ORAL_TABLET | Freq: Two times a day (BID) | ORAL | 0 refills | Status: AC

## 2018-02-22 MED ORDER — METRONIDAZOLE 500 MG PO TABS
500.0000 mg | ORAL_TABLET | Freq: Once | ORAL | Status: AC
  Administered 2018-02-22: 500 mg via ORAL
  Filled 2018-02-22: qty 1

## 2018-02-22 MED ORDER — ONDANSETRON 4 MG PO TBDP
4.0000 mg | ORAL_TABLET | Freq: Once | ORAL | Status: AC | PRN
  Administered 2018-02-22: 4 mg via ORAL

## 2018-02-22 MED ORDER — ONDANSETRON HCL 4 MG/2ML IJ SOLN
4.0000 mg | Freq: Once | INTRAMUSCULAR | Status: AC
  Administered 2018-02-22: 4 mg via INTRAVENOUS
  Filled 2018-02-22: qty 2

## 2018-02-22 MED ORDER — ONDANSETRON 4 MG PO TBDP
ORAL_TABLET | ORAL | Status: AC
  Filled 2018-02-22: qty 1

## 2018-02-22 MED ORDER — IOPAMIDOL (ISOVUE-300) INJECTION 61%
100.0000 mL | Freq: Once | INTRAVENOUS | Status: AC | PRN
  Administered 2018-02-22: 80 mL via INTRAVENOUS

## 2018-02-22 MED ORDER — KETOROLAC TROMETHAMINE 30 MG/ML IJ SOLN
15.0000 mg | Freq: Once | INTRAMUSCULAR | Status: AC
  Administered 2018-02-22: 15 mg via INTRAVENOUS
  Filled 2018-02-22: qty 1

## 2018-02-22 MED ORDER — CIPROFLOXACIN HCL 500 MG PO TABS: 500.0000 mg | ORAL_TABLET | Freq: Two times a day (BID) | ORAL | 0 refills | Status: DC

## 2018-02-22 MED ORDER — METRONIDAZOLE 500 MG PO TABS: 500.0000 mg | ORAL_TABLET | Freq: Three times a day (TID) | ORAL | 0 refills | Status: AC

## 2018-02-22 NOTE — ED Notes (Signed)
Pt verbalized understanding of discharge instructions. NAD at this time. 

## 2018-02-22 NOTE — ED Provider Notes (Signed)
[facility] Emergency Department Provider Note  ____________________________________________   First MD Initiated Contact with Patient 02/22/18 [phone]     (approximate)  I have reviewed the triage vital signs and the nursing notes.   HISTORY  Chief Complaint Abdominal Pain   HPI [name] is a 73 y.o. female who comes to the emergency department with roughly 24 hours of gradual onset not maximal onset diffuse abdominal discomfort nausea vomiting and several loose stools.  No fevers or chills.  No chest pain or shortness of breath.  Symptoms were not ripping or tearing and did not go straight to her back.  Nothing seems to make them better or worse.  No dysuria frequency or hesitance.  No back pain.   She does have a previous history of diverticulitis which concerned her.  She was told apparently by her primary care physician to "never have surgery" even if she has recurrent diverticulitis.   Past Medical History:  Diagnosis Date  . Anginal pain ([place])   . Anxiety   . Arthritis   . CHF (congestive heart failure) ([place])   . Chronic back pain    stenosis.degenerative disc,some scoliosis  . Constipation    takes Stool Softener daily  . Coronary artery disease   . Depression    takes Cymbalta daily  . Diabetes mellitus    Type 2 diabetic. Average fasting blood sugar runs high 170-200  . Diastolic CHF ([place]) 50/27/7412   Per patient, diagnosed in 2018  . Diverticulosis   . E coli infection   . GERD (gastroesophageal reflux disease)    takes Nexium daily  . Headache   . Headache 02/16/2016  . Hemorrhoids   . History of colon polyps    benign  . History of gout    doesn't take any meds  . History of hiatal hernia   . History of vertigo    doesn't take any meds  . Hyperlipidemia    takes Praluent daily  . Hypertension    currently BP medications are on hold   . Hypothyroidism    takes Synthroid daily  . Insomnia    takes Restoril nightly  .  Joint pain   . Joint swelling   . Muscle spasm    takes Robaxin as needed  . NSVT (nonsustained ventricular tachycardia) ([place])   . OSA on CPAP   . Periodic heart flutter   . Peripheral edema    takes LAsix as needed  . Peripheral vascular disease ([place])    AAA as stated per pt / was just discovered and pt states has not been referred to vascular MD   . Restless leg    takes Requip daily  . Rosacea   . Sleep apnea   . Urinary incontinence   . Urinary urgency   . Varicose veins   . Weakness    numbness and tingling.    Patient Active Problem List   Diagnosis Date Noted  . Atrial tachycardia ([place]) 01/05/2018  . Abnormal cardiac CT angiography   . Diverticulitis 07/27/2017  . Lymphedema 12/18/2016  . Chronic diastolic heart failure ([place]) 08/10/2016  . Hypertensive heart disease 07/25/2016  . Morbid obesity ([place]) 07/25/2016  . Physical deconditioning 07/25/2016  . Pyuria 06/25/2016  . Abdominal pain 06/24/2016  . Abscess of back   . Lumbar degenerative disc disease 11/14/2015  . Anxiety 02/24/2014  . Chest pain 06/09/2012  . CAD (coronary artery disease), native coronary artery 06/09/2012  . Hyperlipidemia  08/09/2011  . Arm pain 06/28/2011  . Diabetes mellitus ([place]) 05/05/2010  . OSA on CPAP 05/05/2010  . HTN (hypertension) 05/05/2010  . Edema 05/05/2010  . HYPERTRIGLYCERIDEMIA 06/08/2008    Past Surgical History:  Procedure Laterality Date  . ABDOMINAL HYSTERECTOMY     with BSo  . CARDIAC CATHETERIZATION  2013   Normal  . CARPAL TUNNEL RELEASE Bilateral   . CATARACT EXTRACTION, BILATERAL    . CHOLECYSTECTOMY    . COLONOSCOPY    . DIAGNOSTIC LAPAROSCOPY     multiple times  . DILATION AND CURETTAGE OF UTERUS    . ESOPHAGOGASTRODUODENOSCOPY (EGD) WITH PROPOFOL N/A 11/01/2014   Procedure: ESOPHAGOGASTRODUODENOSCOPY (EGD) WITH PROPOFOL;  Surgeon: Hulen Luster, MD;  Location: [facility] ENDOSCOPY;  Service: Gastroenterology;  Laterality: N/A;  . HARDWARE REMOVAL Left  10/11/2016   Procedure: HARDWARE REMOVAL-LEFT RADIUS;  Surgeon: Lovell Sheehan, MD;  Location: [facility] ORS;  Service: Orthopedics;  Laterality: Left;  Left Radius Wrist   . IMPLANTABLE CONTACT LENS IMPLANTATION     bilateral  . LAMINECTOMY  11/13/2015  . LEFT HEART CATH AND CORONARY ANGIOGRAPHY Left 10/01/2017   Procedure: LEFT HEART CATH AND CORONARY ANGIOGRAPHY;  Surgeon: Nelva Bush, MD;  Location: [place] CV LAB;  Service: Cardiovascular;  Laterality: Left;  . LOOP RECORDER INSERTION N/A 10/17/2017   Procedure: LOOP RECORDER INSERTION;  Surgeon: Deboraha Sprang, MD;  Location: [place] CV LAB;  Service: Cardiovascular;  Laterality: N/A;  . LUMBAR FUSION  11/2015  . LUMBAR WOUND DEBRIDEMENT N/A 12/02/2015   Procedure: WOUND Exploration;  Surgeon: Consuella Lose, MD;  Location: [place];  Service: Neurosurgery;  Laterality: N/A;  . OPEN REDUCTION INTERNAL FIXATION (ORIF) DISTAL RADIAL FRACTURE Left 08/29/2016   Procedure: OPEN REDUCTION INTERNAL FIXATION (ORIF) DISTAL RADIAL FRACTURE;  Surgeon: Lovell Sheehan, MD;  Location: [facility] ORS;  Service: Orthopedics;  Laterality: Left;  . PICC LINE PLACE PERIPHERAL ([place] HX)     right upper arm   . ROTATOR CUFF REPAIR Left   . SAVORY DILATION N/A 11/01/2014   Procedure: SAVORY DILATION;  Surgeon: Hulen Luster, MD;  Location: [facility] ENDOSCOPY;  Service: Gastroenterology;  Laterality: N/A;  . TONSILLECTOMY    . TRIGGER FINGER RELEASE Bilateral     Prior to Admission medications   Medication Sig Start Date End Date Taking? Authorizing Provider  acetaminophen (TYLENOL) 650 MG CR tablet Take 650 mg by mouth 2 (two) times daily.    [provider]  albuterol (PROVENTIL HFA;VENTOLIN HFA) 108 (90 Base) MCG/ACT inhaler Inhale 2 puffs into the lungs every 6 (six) hours as needed for wheezing or shortness of breath. 01/09/17   Alisa Graff, FNP  Alirocumab (PRALUENT) 150 MG/ML SOPN Inject 150 mg into the skin every 14 (fourteen) days. 07/27/17    Bettey Costa, MD  aspirin 81 MG tablet Take 81 mg by mouth at bedtime.     [provider]  atenolol (TENORMIN) 25 MG tablet Take 0.5 tablets (12.5 mg total) by mouth daily. 01/06/18   Minna Merritts, MD  azelastine (ASTELIN) 0.1 % nasal spray Place 2 sprays into both nostrils at bedtime as needed for rhinitis.     [provider]  Cholecalciferol (VITAMIN D) 2000 units tablet Take 2,000 Units by mouth daily.    [provider]  ciprofloxacin (CIPRO) 500 MG tablet Take 1 tablet (500 mg total) by mouth 2 (two) times daily for 10 days. 02/22/18 03/04/18  Darel Hong, MD  gabapentin (NEURONTIN) 300  MG capsule Take 300 mg by mouth 2 (two) times daily.    [provider]  HYDROcodone-acetaminophen (NORCO) 5-325 MG tablet Take 1 tablet by mouth every 6 (six) hours as needed for up to 15 doses for severe pain. 02/22/18   Darel Hong, MD  hyoscyamine (LEVSIN SL) 0.125 MG SL tablet Place 2 tablets (0.25 mg total) under the tongue every 4 (four) hours as needed for cramping. 06/25/16   Theodoro Grist, MD  insulin aspart (NOVOLOG) 100 UNIT/ML FlexPen Inject 10-16 Units into the skin See admin instructions. 10 units with breakfast, 10 units with lunch, 16 units at dinner, >200 increase by 2 units. 03/01/15 12/04/18  [provider]  insulin glargine (LANTUS) 100 UNIT/ML injection Inject 32 Units into the skin every morning.     [provider]  ipratropium-albuterol (DUONEB) 0.5-2.5 (3) MG/3ML SOLN Take 3 mLs by nebulization every 6 (six) hours as needed. Patient taking differently: Take 3 mLs by nebulization every 6 (six) hours as needed (for shortness of breath/wheezing).  07/27/17   Bettey Costa, MD  JARDIANCE 25 MG TABS tablet  12/17/17   [provider]  levothyroxine (SYNTHROID, LEVOTHROID) 75 MCG tablet Take 75 mcg by mouth daily before breakfast.  11/24/12   [provider]  metroNIDAZOLE (FLAGYL) 500 MG tablet Take 1 tablet (500  mg total) by mouth 3 (three) times daily for 10 days. 02/22/18 03/04/18  Darel Hong, MD  nitroGLYCERIN (NITROSTAT) 0.4 MG SL tablet Place 1 tablet (0.4 mg total) under the tongue every 5 (five) minutes as needed for chest pain. 03/06/16 12/04/18  Minna Merritts, MD  ondansetron (ZOFRAN) 4 MG tablet Take 4 mg by mouth every 8 (eight) hours as needed for nausea or vomiting.  07/19/17   [provider]  pantoprazole (PROTONIX) 40 MG tablet Take 40 mg by mouth 2 (two) times daily before a meal.    [provider]  potassium chloride SA (K-DUR,KLOR-CON) 20 MEQ tablet Take 2 tablets (40 mEq total) by mouth daily. 01/06/18   Minna Merritts, MD  ranolazine (RANEXA) 1000 MG SR tablet Take 1 tablet (1,000 mg total) by mouth 2 (two) times daily. 06/13/17   Minna Merritts, MD  ropinirole (REQUIP) 5 MG tablet Take 5 mg by mouth 2 (two) times daily.     [provider]  temazepam (RESTORIL) 30 MG capsule Take 30 mg by mouth at bedtime.    [provider]  torsemide (DEMADEX) 20 MG tablet Take 40 mg by mouth daily.     [provider]  traMADol (ULTRAM) 50 MG tablet Take 50 mg by mouth every 6 (six) hours as needed for moderate pain or severe pain.    [provider]    Allergies Reglan [metoclopramide]; Zetia [ezetimibe]; Ceftin [cefuroxime axetil]; Codeine; Penicillins; and Vicodin [hydrocodone-acetaminophen]  Family History  Problem Relation Age of Onset  . Heart disease Mother   . Breast cancer Mother 24  . Heart disease Father   . Heart attack Sister   . Heart disease Sister   . Heart disease Brother     Social History Social History   Tobacco Use  . Smoking status: Never Smoker  . Smokeless tobacco: Never Used  Substance Use Topics  . Alcohol use: No  . Drug use: No    Review of Systems Constitutional: No fever/chills Eyes: No visual changes. ENT: No sore throat. Cardiovascular: Denies chest pain. Respiratory: Denies  shortness of breath. Gastrointestinal: Positive for abdominal pain.  Positive for nausea, positive for vomiting.  Positive for diarrhea.  No constipation. Genitourinary: Negative for dysuria. Musculoskeletal: Negative for back pain. Skin: Negative for rash. Neurological: Negative for headaches, focal weakness or numbness.   ____________________________________________   PHYSICAL EXAM:  VITAL SIGNS: ED Triage Vitals  Enc Vitals Group     BP 02/22/18 0259 (!) 169/80     Pulse Rate 02/22/18 0259 100     Resp 02/22/18 0259 19     Temp 02/22/18 0259 98 F (36.7 C)     Temp src --      SpO2 02/22/18 0259 100 %     Weight 02/22/18 0258 210 lb (95.3 kg)     Height 02/22/18 0258 5\' 1"  (1.549 m)     Head Circumference --      Peak Flow --      Pain Score 02/22/18 0258 7     Pain Loc --      Pain Edu? --      Excl. in [place]? --     Constitutional: Alert and oriented x4 appears obviously uncomfortable grimacing and holding her lower abdomen Eyes: PERRL EOMI. Head: Atraumatic. Nose: No congestion/rhinnorhea. Mouth/Throat: No trismus Neck: No stridor.   Cardiovascular: Normal rate, regular rhythm. Grossly normal heart sounds.  Good peripheral circulation. Respiratory: Normal respiratory effort.  No retractions. Lungs CTAB and moving good air Gastrointestinal: Soft nondistended somewhat tender in her left lower quadrant although with no rebound or guarding no peritonitis negative Rovsing's Musculoskeletal: No lower extremity edema   Neurologic:  Normal speech and language. No gross focal neurologic deficits are appreciated. Skin:  Skin is warm, dry and intact. No rash noted. Psychiatric: Mood and affect are normal. Speech and behavior are normal.    ____________________________________________   DIFFERENTIAL includes but not limited to  Diverticulitis, perforation, abscess, appendicitis, bowel obstruction, nephrolithiasis ____________________________________________   LABS (all  labs ordered are listed, but only abnormal results are displayed)  Labs Reviewed  COMPREHENSIVE METABOLIC PANEL - Abnormal; Notable for the following components:      Result Value   Glucose, Bld 175 (*)    BUN 26 (*)    Creatinine, Ser 1.32 (*)    GFR calc non Af Amer 40 (*)    GFR calc Af Amer 47 (*)    All other components within normal limits  CBC - Abnormal; Notable for the following components:   WBC 13.4 (*)    RDW 15.8 (*)    All other components within normal limits  URINALYSIS, COMPLETE (UACMP) WITH MICROSCOPIC - Abnormal; Notable for the following components:   Color, Urine YELLOW (*)    APPearance CLEAR (*)    Glucose, UA >=500 (*)    Bacteria, UA RARE (*)    All other components within normal limits  LIPASE, BLOOD    Lab work reviewed by me shows slightly elevated glucose but no signs of DKA.  Glucosuria consistent with chronically poorly controlled diabetes.  Elevated white count consistent with infection __________________________________________  EKG   ____________________________________________  RADIOLOGY  CT abdomen pelvis reviewed by me consistent with sigmoid diverticulitis without complication ____________________________________________   PROCEDURES  Procedure(s) performed: no  Procedures  Critical Care performed: no  ____________________________________________   INITIAL IMPRESSION / ASSESSMENT AND PLAN / ED COURSE  Pertinent labs & imaging results that were available during my care of the patient were reviewed by me and considered in my medical decision making (see chart for details).   As part of my medical  decision making, I reviewed the following data within the [place] History obtained from family if available, nursing notes, old chart and ekg, as well as notes from prior ED visits.  Patient comes to the emergency department with recurrent left lower quadrant abdominal pain nausea vomiting and change in her stools.   She is tender in her left lower quadrant with a history of diverticulitis so in addition to pain control with 4 mg of IV morphine and 4 mg of IV Zofran I will get a CT scan with IV contrast to evaluate for diverticulitis or perforation etc.  The patient's pain is improved although not resolved following the morphine so given her Toradol with significant improvement in her symptoms.  Her CT scan does show simple diverticulitis.  Given first dose of ciprofloxacin and Flagyl here and I will refer her to general surgery as an outpatient.  Strict return precautions have been given.  She is able to eat and drink without complication.      ____________________________________________   FINAL CLINICAL IMPRESSION(S) / ED DIAGNOSES  Final diagnoses:  Sigmoid diverticulitis      NEW MEDICATIONS STARTED DURING THIS VISIT:  Discharge Medication List as of 02/22/2018  6:45 AM    START taking these medications   Details  HYDROcodone-acetaminophen (NORCO) 5-325 MG tablet Take 1 tablet by mouth every 6 (six) hours as needed for up to 15 doses for severe pain., Starting Sat 02/22/2018, Print    metroNIDAZOLE (FLAGYL) 500 MG tablet Take 1 tablet (500 mg total) by mouth 3 (three) times daily for 10 days., Starting Sat 02/22/2018, Until Tue 03/04/2018, Print    ciprofloxacin (CIPRO) 500 MG tablet Take 1 tablet (500 mg total) by mouth 2 (two) times daily for 10 days., Starting Sat 02/22/2018, Until Tue 03/04/2018, Normal         Note:  This document was prepared using Dragon voice recognition software and may include unintentional dictation errors.    Darel Hong, MD 02/28/18 2142

## 2018-02-22 NOTE — ED Triage Notes (Signed)
Patient c/o abdominal pain, N/V, and loose stools (not liquid) beginning yesterday. Patient denies changes to urination.

## 2018-02-22 NOTE — Discharge Instructions (Signed)
Please take all of your antibiotics as prescribed and make an appointment to follow-up with your primary care physician this coming Monday for recheck.  It is also very important to follow-up with the general surgeons to discuss your possible surgical options in the future.  Return to the emergency department for any concerns such as fevers, chills, worsening pain, or for any other issues whatsoever.  It was a pleasure to take care of you today, and thank you for coming to our emergency department.  If you have any questions or concerns before leaving please ask the nurse to grab me and I'm more than happy to go through your aftercare instructions again.  If you were prescribed any opioid pain medication today such as Norco, Vicodin, Percocet, morphine, hydrocodone, or oxycodone please make sure you do not drive when you are taking this medication as it can alter your ability to drive safely.  If you have any concerns once you are home that you are not improving or are in fact getting worse before you can make it to your follow-up appointment, please do not hesitate to call 911 and come back for further evaluation.  Darel Hong, MD  Results for orders placed or performed during the hospital encounter of 02/22/18  Lipase, blood  Result Value Ref Range   Lipase 37 11 - 51 U/L  Comprehensive metabolic panel  Result Value Ref Range   Sodium 141 135 - 145 mmol/L   Potassium 3.6 3.5 - 5.1 mmol/L   Chloride 101 98 - 111 mmol/L   CO2 28 22 - 32 mmol/L   Glucose, Bld 175 (H) 70 - 99 mg/dL   BUN 26 (H) 8 - 23 mg/dL   Creatinine, Ser 1.32 (H) 0.44 - 1.00 mg/dL   Calcium 9.4 8.9 - 10.3 mg/dL   Total Protein 7.1 6.5 - 8.1 g/dL   Albumin 4.1 3.5 - 5.0 g/dL   AST 16 15 - 41 U/L   ALT 16 0 - 44 U/L   Alkaline Phosphatase 125 38 - 126 U/L   Total Bilirubin 0.5 0.3 - 1.2 mg/dL   GFR calc non Af Amer 40 (L) >60 mL/min   GFR calc Af Amer 47 (L) >60 mL/min   Anion gap 12 5 - 15  CBC  Result Value Ref  Range   WBC 13.4 (H) 4.0 - 10.5 K/uL   RBC 4.57 3.87 - 5.11 MIL/uL   Hemoglobin 12.5 12.0 - 15.0 g/dL   HCT 39.9 36.0 - 46.0 %   MCV 87.3 80.0 - 100.0 fL   MCH 27.4 26.0 - 34.0 pg   MCHC 31.3 30.0 - 36.0 g/dL   RDW 15.8 (H) 11.5 - 15.5 %   Platelets 275 150 - 400 K/uL   nRBC 0.0 0.0 - 0.2 %  Urinalysis, Complete w Microscopic  Result Value Ref Range   Color, Urine YELLOW (A) YELLOW   APPearance CLEAR (A) CLEAR   Specific Gravity, Urine 1.022 1.005 - 1.030   pH 6.0 5.0 - 8.0   Glucose, UA >=500 (A) NEGATIVE mg/dL   Hgb urine dipstick NEGATIVE NEGATIVE   Bilirubin Urine NEGATIVE NEGATIVE   Ketones, ur NEGATIVE NEGATIVE mg/dL   Protein, ur NEGATIVE NEGATIVE mg/dL   Nitrite NEGATIVE NEGATIVE   Leukocytes, UA NEGATIVE NEGATIVE   WBC, UA 0-5 0 - 5 WBC/hpf   Bacteria, UA RARE (A) NONE SEEN   Squamous Epithelial / LPF NONE SEEN 0 - 5   Ct Abdomen Pelvis W Contrast  Result Date: 02/22/2018 CLINICAL DATA:  Patient with abdominal pain, nausea and vomiting. EXAM: CT ABDOMEN AND PELVIS WITH CONTRAST TECHNIQUE: Multidetector CT imaging of the abdomen and pelvis was performed using the standard protocol following bolus administration of intravenous contrast. CONTRAST:  11mL ISOVUE-300 IOPAMIDOL (ISOVUE-300) INJECTION 61% COMPARISON:  CT abdomen pelvis 07/26/2017 FINDINGS: Lower chest: Normal heart size. Dependent atelectasis within the bilateral lower lobes. No pleural effusion. Hepatobiliary: The liver is normal in size and contour. No focal hepatic lesion is identified. Patient status post cholecystectomy. Stable biliary system. Pancreas: Unremarkable Spleen: Unremarkable Adrenals/Urinary Tract: Normal adrenal glands. Kidneys enhance symmetrically with contrast. No hydronephrosis. Urinary bladder is unremarkable. Stomach/Bowel: Focal wall thickening and pericolonic fat stranding involving the sigmoid colon (image 71; series 2). No evidence for perforation or surrounding abscess formation. Normal  appendix. No evidence for small bowel obstruction. Small hiatal hernia. Normal morphology of the stomach. Vascular/Lymphatic: Normal caliber abdominal aorta. Peripheral calcified atherosclerotic plaque. No retroperitoneal lymphadenopathy. Reproductive: Status post hysterectomy. Other: None. Musculoskeletal: Lumbar spine degenerative changes. Lower thoracic spine degenerative changes. Lumbar spinal fusion hardware. IMPRESSION: Findings compatible with acute sigmoid diverticulitis. No evidence for perforation or abscess formation. Electronically Signed   By: Lovey Newcomer M.D.   On: 02/22/2018 06:30

## 2018-02-24 ENCOUNTER — Other Ambulatory Visit: Payer: Self-pay | Admitting: Internal Medicine

## 2018-02-24 ENCOUNTER — Encounter: Payer: Self-pay | Admitting: *Deleted

## 2018-02-24 DIAGNOSIS — Z1231 Encounter for screening mammogram for malignant neoplasm of breast: Secondary | ICD-10-CM

## 2018-02-26 ENCOUNTER — Ambulatory Visit (INDEPENDENT_AMBULATORY_CARE_PROVIDER_SITE_OTHER): Payer: Medicare Other

## 2018-02-26 DIAGNOSIS — R55 Syncope and collapse: Secondary | ICD-10-CM

## 2018-02-27 DIAGNOSIS — N183 Chronic kidney disease, stage 3 unspecified: Secondary | ICD-10-CM | POA: Insufficient documentation

## 2018-02-27 NOTE — Progress Notes (Signed)
Carelink Summary Report / Loop Recorder 

## 2018-02-28 LAB — CUP PACEART REMOTE DEVICE CHECK
Date Time Interrogation Session: 20200115134138
Implantable Pulse Generator Implant Date: 20190905

## 2018-03-02 LAB — CUP PACEART REMOTE DEVICE CHECK
Date Time Interrogation Session: 20191213133935
Implantable Pulse Generator Implant Date: 20190905

## 2018-03-05 ENCOUNTER — Ambulatory Visit: Payer: Medicare Other | Admitting: Surgery

## 2018-03-31 ENCOUNTER — Ambulatory Visit (INDEPENDENT_AMBULATORY_CARE_PROVIDER_SITE_OTHER): Payer: Medicare Other

## 2018-03-31 DIAGNOSIS — R55 Syncope and collapse: Secondary | ICD-10-CM

## 2018-04-01 LAB — CUP PACEART REMOTE DEVICE CHECK
Date Time Interrogation Session: 20200217154033
Implantable Pulse Generator Implant Date: 20190905

## 2018-04-01 LAB — CUP PACEART INCLINIC DEVICE CHECK
Date Time Interrogation Session: 20191217155630
Implantable Pulse Generator Implant Date: 20190905

## 2018-04-09 ENCOUNTER — Ambulatory Visit
Admission: RE | Admit: 2018-04-09 | Discharge: 2018-04-09 | Disposition: A | Discharge status: Home / Self Care | Payer: Medicare Other | Source: Ambulatory Visit | Attending: Internal Medicine | Admitting: Internal Medicine

## 2018-04-09 DIAGNOSIS — Z1231 Encounter for screening mammogram for malignant neoplasm of breast: Secondary | ICD-10-CM | POA: Diagnosis present

## 2018-04-09 NOTE — Progress Notes (Signed)
Carelink Summary Report / Loop Recorder 

## 2018-05-05 ENCOUNTER — Other Ambulatory Visit: Payer: Self-pay

## 2018-05-05 ENCOUNTER — Ambulatory Visit (INDEPENDENT_AMBULATORY_CARE_PROVIDER_SITE_OTHER): Payer: Medicare Other | Admitting: *Deleted

## 2018-05-05 DIAGNOSIS — R55 Syncope and collapse: Secondary | ICD-10-CM | POA: Diagnosis not present

## 2018-05-06 LAB — CUP PACEART REMOTE DEVICE CHECK
Date Time Interrogation Session: 20200321161054
Implantable Pulse Generator Implant Date: 20190905

## 2018-05-14 NOTE — Progress Notes (Signed)
Carelink Summary Report / Loop Recorder 

## 2018-05-16 ENCOUNTER — Emergency Department: Payer: Medicare Other

## 2018-05-16 ENCOUNTER — Encounter: Payer: Self-pay | Admitting: Radiology

## 2018-05-16 ENCOUNTER — Telehealth: Payer: Self-pay | Admitting: Cardiovascular Disease

## 2018-05-16 ENCOUNTER — Emergency Department
Admission: EM | Admit: 2018-05-16 | Discharge: 2018-05-16 | Disposition: A | Discharge status: Home / Self Care | Payer: Medicare Other | Attending: Emergency Medicine | Admitting: Emergency Medicine

## 2018-05-16 ENCOUNTER — Other Ambulatory Visit: Payer: Self-pay

## 2018-05-16 ENCOUNTER — Telehealth (INDEPENDENT_AMBULATORY_CARE_PROVIDER_SITE_OTHER): Payer: Medicare Other | Admitting: Cardiovascular Disease

## 2018-05-16 DIAGNOSIS — I471 Supraventricular tachycardia: Secondary | ICD-10-CM

## 2018-05-16 DIAGNOSIS — R0609 Other forms of dyspnea: Secondary | ICD-10-CM

## 2018-05-16 DIAGNOSIS — Z79899 Other long term (current) drug therapy: Secondary | ICD-10-CM | POA: Diagnosis not present

## 2018-05-16 DIAGNOSIS — E039 Hypothyroidism, unspecified: Secondary | ICD-10-CM | POA: Insufficient documentation

## 2018-05-16 DIAGNOSIS — I25118 Atherosclerotic heart disease of native coronary artery with other forms of angina pectoris: Secondary | ICD-10-CM

## 2018-05-16 DIAGNOSIS — Z794 Long term (current) use of insulin: Secondary | ICD-10-CM | POA: Insufficient documentation

## 2018-05-16 DIAGNOSIS — I110 Hypertensive heart disease with heart failure: Secondary | ICD-10-CM | POA: Diagnosis not present

## 2018-05-16 DIAGNOSIS — I5032 Chronic diastolic (congestive) heart failure: Secondary | ICD-10-CM | POA: Insufficient documentation

## 2018-05-16 DIAGNOSIS — Z7982 Long term (current) use of aspirin: Secondary | ICD-10-CM | POA: Insufficient documentation

## 2018-05-16 DIAGNOSIS — N183 Chronic kidney disease, stage 3 unspecified: Secondary | ICD-10-CM

## 2018-05-16 DIAGNOSIS — I2510 Atherosclerotic heart disease of native coronary artery without angina pectoris: Secondary | ICD-10-CM | POA: Insufficient documentation

## 2018-05-16 DIAGNOSIS — M79602 Pain in left arm: Secondary | ICD-10-CM | POA: Diagnosis present

## 2018-05-16 DIAGNOSIS — E119 Type 2 diabetes mellitus without complications: Secondary | ICD-10-CM | POA: Diagnosis not present

## 2018-05-16 DIAGNOSIS — E1122 Type 2 diabetes mellitus with diabetic chronic kidney disease: Secondary | ICD-10-CM

## 2018-05-16 LAB — CBC
HCT: 40.6 % (ref 36.0–46.0)
Hemoglobin: 12.6 g/dL (ref 12.0–15.0)
MCH: 27.5 pg (ref 26.0–34.0)
MCHC: 31 g/dL (ref 30.0–36.0)
MCV: 88.6 fL (ref 80.0–100.0)
Platelets: 281 10*3/uL (ref 150–400)
RBC: 4.58 MIL/uL (ref 3.87–5.11)
RDW: 15.4 % (ref 11.5–15.5)
WBC: 7.3 10*3/uL (ref 4.0–10.5)
nRBC: 0 % (ref 0.0–0.2)

## 2018-05-16 LAB — BASIC METABOLIC PANEL
Anion gap: 11 (ref 5–15)
BUN: 21 mg/dL (ref 8–23)
CO2: 31 mmol/L (ref 22–32)
Calcium: 9.3 mg/dL (ref 8.9–10.3)
Chloride: 99 mmol/L (ref 98–111)
Creatinine, Ser: 1.3 mg/dL — ABNORMAL HIGH (ref 0.44–1.00)
GFR calc Af Amer: 47 mL/min — ABNORMAL LOW (ref >60)
GFR calc non Af Amer: 41 mL/min — ABNORMAL LOW (ref >60)
Glucose, Bld: 151 mg/dL — ABNORMAL HIGH (ref 70–99)
Potassium: 3.2 mmol/L — ABNORMAL LOW (ref 3.5–5.1)
Sodium: 141 mmol/L (ref 135–145)

## 2018-05-16 LAB — GLUCOSE, CAPILLARY: Glucose-Capillary: 83 mg/dL (ref 70–99)

## 2018-05-16 LAB — TROPONIN I: Troponin I: <0.03 ng/mL (ref <0.03)

## 2018-05-16 MED ORDER — SODIUM CHLORIDE 0.9% FLUSH
3.0000 mL | Freq: Once | INTRAVENOUS | Status: AC
  Administered 2018-05-16: 16:00:00 3 mL via INTRAVENOUS

## 2018-05-16 MED ORDER — NITROGLYCERIN 2 % TD OINT
1.0000 [in_us] | TOPICAL_OINTMENT | Freq: Once | TRANSDERMAL | Status: AC
  Administered 2018-05-16: 16:00:00 1 [in_us] via TOPICAL
  Filled 2018-05-16: qty 1

## 2018-05-16 MED ORDER — ONDANSETRON HCL 4 MG/2ML IJ SOLN
4.0000 mg | Freq: Once | INTRAMUSCULAR | Status: AC
  Administered 2018-05-16: 17:00:00 4 mg via INTRAVENOUS
  Filled 2018-05-16: qty 2

## 2018-05-16 MED ORDER — MORPHINE SULFATE (PF) 2 MG/ML IV SOLN
2.0000 mg | Freq: Once | INTRAVENOUS | Status: AC
  Administered 2018-05-16: 17:00:00 2 mg via INTRAVENOUS
  Filled 2018-05-16: qty 1

## 2018-05-16 MED ORDER — IOHEXOL 350 MG/ML SOLN
75.0000 mL | Freq: Once | INTRAVENOUS | Status: AC | PRN
  Administered 2018-05-16: 19:00:00 75 mL via INTRAVENOUS

## 2018-05-16 MED ORDER — KETOROLAC TROMETHAMINE 30 MG/ML IJ SOLN
15.0000 mg | Freq: Once | INTRAMUSCULAR | Status: AC
  Administered 2018-05-16: 15 mg via INTRAVENOUS
  Filled 2018-05-16: qty 1

## 2018-05-16 NOTE — Patient Instructions (Signed)
Please go to the emergency room for EKG and cardiac enzymes Symptoms concerning for unstable angina You have known disease based off prior cardiac catheterization September 2019   Medication Instructions:  No changes  If you need a refill on your cardiac medications before your next appointment, please call your pharmacy.    Lab work: No new labs needed   If you have labs (blood work) drawn today and your tests are completely normal, you will receive your results only by: [name] MyChart Message (if you have MyChart) OR . A paper copy in the mail If you have any lab test that is abnormal or we need to change your treatment, we will call you to review the results.   Testing/Procedures: No new testing needed   Follow-Up: At [facility], you and your health needs are our priority.  As part of our continuing mission to provide you with exceptional heart care, we have created designated Provider Care Teams.  These Care Teams include your primary Cardiologist (physician) and Advanced Practice Providers (APPs -  Physician Assistants and Nurse Practitioners) who all work together to provide you with the care you need, when you need it.  . You will need a follow up appointment in 1 months   . Providers on your designated Care Team:   . Murray Hodgkins, NP . Christell Faith, PA-C . Marrianne Mood, PA-C  Any Other Special Instructions Will Be Listed Below (If Applicable).  For educational health videos Log in to : [email or web address] Or : [email or web address], password : [email or web address]

## 2018-05-16 NOTE — ED Triage Notes (Signed)
FIRST NURSE NOTE-sent by dr Rockey Sit[facility] office for CP. Started last night left chest and arm, NTG helped at first but now not helping. Pulled for EKG.

## 2018-05-16 NOTE — Telephone Encounter (Signed)
Pt c/o of Chest Pain: STAT if CP now or developed within 24 hours  1. Are you having CP right now? Yes, left arm pain into back  2. Are you experiencing any other symptoms (ex. SOB, nausea, vomiting, sweating)? no  3. How long have you been experiencing CP? Yesterday at suppertime  4. Is your CP continuous or coming and going? continual  5. Have you taken Nitroglycerin? Yes, 2 ?

## 2018-05-16 NOTE — ED Notes (Signed)
Pt does not take blood thinners. (1) 81mg  aspirin daily. Pt reports last 2 Nitro at 1430 have not changed the pain at all. BP upon arrival is 155/69.

## 2018-05-16 NOTE — ED Notes (Signed)
Patient transported to X-ray 

## 2018-05-16 NOTE — ED Triage Notes (Signed)
Pt reports left arm pain since last night that was helped with nitro, pt states today she is having left chest pain and left arm pain that isn't going away. Last dose of nitro was at 1430

## 2018-05-16 NOTE — ED Notes (Signed)
Nitro removed from left chest at this time

## 2018-05-16 NOTE — Telephone Encounter (Signed)
Patient calling. Hx angina, on Ranexa.  Last night after dinner, started having left arm pain radiating to the back. At 9:30 pm, took Nitro and was able to go to sleep. Discomfort woke her up again about 0100 am. Took Nitro and pain went away. Today, she has a dull ache and burning. Took 2 more Nitro around lunch time that did not help. No jaw pain or pressure.  Feels dizzy and weak. BP 117/77, HR 78. Discussed with Dr Rockey Situ. Patient added on for Evisit today.      Virtual Visit Pre-Appointment Phone Call  Steps For Call:  1. Confirm consent - "In the setting of the current Covid19 crisis, you are scheduled for a (phone or video) visit with your provider on (date) at (time).  Just as we do with many in-office visits, in order for you to participate in this visit, we must obtain consent.  If you'd like, I can send this to your mychart (if signed up) or email for you to review.  Otherwise, I can obtain your verbal consent now.  All virtual visits are billed to your insurance company just like a normal visit would be.  By agreeing to a virtual visit, we'd like you to understand that the technology does not allow for your provider to perform an examination, and thus may limit your provider's ability to fully assess your condition.  Finally, though the technology is pretty good, we cannot assure that it will always work on either your or our end, and in the setting of a video visit, we may have to convert it to a phone-only visit.  In either situation, we cannot ensure that we have a secure connection.  Are you willing to proceed?"  2. Give patient instructions for WebEx download to smartphone as below if video visit  3. Advise patient to be prepared with any vital sign or heart rhythm information, their current medicines, and a piece of paper and pen handy for any instructions they may receive the day of their visit  4. Inform patient they will receive a phone call 15 minutes prior to their  appointment time (may be from unknown caller ID) so they should be prepared to answer  5. Confirm that appointment type is correct in Epic appointment notes (video vs telephone)    TELEPHONE CALL NOTE  [name] has been deemed a candidate for a follow-up tele-health visit to limit community exposure during the Covid-19 pandemic. I spoke with the patient via phone to ensure availability of phone/video source, confirm preferred email & phone number, and discuss instructions and expectations.  I reminded [name] to be prepared with any vital sign and/or heart rhythm information that could potentially be obtained via home monitoring, at the time of her visit. I reminded [name] to expect a phone call at the time of her visit if her visit.  Did the patient verbally acknowledge consent to treatment? yes  Roney Jaffe, RN 05/16/2018 2:52 PM   DOWNLOADING THE [place], go to [employer] and type in WebEx in the search bar. [place] [employer], the blue/green circle. The app is free but as with any other app downloads, their phone may require them to verify saved payment information or Apple password. The patient does NOT have to create an account.  - If Android, ask patient to go to [employer] and type in WebEx in the search bar. [place]  [employer], the blue/green circle. The app is free but as with any other app downloads, their phone may require them to verify saved payment information or Android password. The patient does NOT have to create an account.   CONSENT FOR TELE-HEALTH VISIT - PLEASE REVIEW  I hereby voluntarily request, consent and authorize [facility] and its employed or contracted physicians, physician assistants, nurse practitioners or other licensed health care professionals (the Practitioner), to provide me with telemedicine health care services (the "Services") as deemed necessary by the  treating Practitioner. I acknowledge and consent to receive the Services by the Practitioner via telemedicine. I understand that the telemedicine visit will involve communicating with the Practitioner through live audiovisual communication technology and the disclosure of certain medical information by electronic transmission. I acknowledge that I have been given the opportunity to request an in-person assessment or other available alternative prior to the telemedicine visit and am voluntarily participating in the telemedicine visit.  I understand that I have the right to withhold or withdraw my consent to the use of telemedicine in the course of my care at any time, without affecting my right to future care or treatment, and that the Practitioner or I may terminate the telemedicine visit at any time. I understand that I have the right to inspect all information obtained and/or recorded in the course of the telemedicine visit and may receive copies of available information for a reasonable fee.  I understand that some of the potential risks of receiving the Services via telemedicine include:  [name] Delay or interruption in medical evaluation due to technological equipment failure or disruption; . Information transmitted may not be sufficient (e.g. poor resolution of images) to allow for appropriate medical decision making by the Practitioner; and/or  . In rare instances, security protocols could fail, causing a breach of personal health information.  Furthermore, I acknowledge that it is my responsibility to provide information about my medical history, conditions and care that is complete and accurate to the best of my ability. I acknowledge that Practitioner's advice, recommendations, and/or decision may be based on factors not within their control, such as incomplete or inaccurate data provided by me or distortions of diagnostic images or specimens that may result from electronic transmissions. I understand  that the practice of medicine is not an exact science and that Practitioner makes no warranties or guarantees regarding treatment outcomes. I acknowledge that I will receive a copy of this consent concurrently upon execution via email to the email address I last provided but may also request a printed copy by calling the office of [facility].    I understand that my insurance will be billed for this visit.   I have read or had this consent read to me. . I understand the contents of this consent, which adequately explains the benefits and risks of the Services being provided via telemedicine.  . I have been provided ample opportunity to ask questions regarding this consent and the Services and have had my questions answered to my satisfaction. . I give my informed consent for the services to be provided through the use of telemedicine in my medical care  By participating in this telemedicine visit I agree to the above.

## 2018-05-16 NOTE — ED Provider Notes (Signed)
[facility] Emergency Department Provider Note   ____________________________________________    I have reviewed the triage vital signs and the nursing notes.   HISTORY  Chief Complaint Chest Pain     HPI [name] is a 73 y.o. female who presents with complaints of left arm pain.  Patient reports she has a history of coronary artery disease and diabetes and has had angina in the past.  Yesterday evening while she was watching TV she had a pain in her left arm that she describes as throbbing, she took a nitroglycerin with resolution of the pain.  This occurred again once overnight with similar results with nitroglycerin.  This morning she reports she was working around the house and developed the pain in her left arm again but this time nitroglycerin did not seem to help.  She denies chest pain.  No shortness of breath.  No nausea or vomiting or diaphoresis.  Past Medical History:  Diagnosis Date  . Anginal pain ([place])   . Anxiety   . Arthritis   . CHF (congestive heart failure) ([place])   . Chronic back pain    stenosis.degenerative disc,some scoliosis  . Constipation    takes Stool Softener daily  . Coronary artery disease   . Depression    takes Cymbalta daily  . Diabetes mellitus    Type 2 diabetic. Average fasting blood sugar runs high 170-200  . Diastolic CHF ([place]) 61/44/3154   Per patient, diagnosed in 2018  . Diverticulosis   . E coli infection   . GERD (gastroesophageal reflux disease)    takes Nexium daily  . Headache   . Headache 02/16/2016  . Hemorrhoids   . History of colon polyps    benign  . History of gout    doesn't take any meds  . History of hiatal hernia   . History of vertigo    doesn't take any meds  . Hyperlipidemia    takes Praluent daily  . Hypertension    currently BP medications are on hold   . Hypothyroidism    takes Synthroid daily  . Insomnia    takes Restoril nightly  . Joint pain   . Joint swelling    . Muscle spasm    takes Robaxin as needed  . NSVT (nonsustained ventricular tachycardia) ([place])   . OSA on CPAP   . Periodic heart flutter   . Peripheral edema    takes LAsix as needed  . Peripheral vascular disease ([place])    AAA as stated per pt / was just discovered and pt states has not been referred to vascular MD   . Restless leg    takes Requip daily  . Rosacea   . Sleep apnea   . Urinary incontinence   . Urinary urgency   . Varicose veins   . Weakness    numbness and tingling.    Patient Active Problem List   Diagnosis Date Noted  . Atrial tachycardia ([place]) 01/05/2018  . Abnormal cardiac CT angiography   . Diverticulitis 07/27/2017  . Lymphedema 12/18/2016  . Chronic diastolic heart failure ([place]) 08/10/2016  . Hypertensive heart disease 07/25/2016  . Morbid obesity ([place]) 07/25/2016  . Physical deconditioning 07/25/2016  . Pyuria 06/25/2016  . Abdominal pain 06/24/2016  . Abscess of back   . Lumbar degenerative disc disease 11/14/2015  . Anxiety 02/24/2014  . Chest pain 06/09/2012  . Atherosclerosis of native coronary artery with stable angina pectoris ([place]) 06/09/2012  .  Hyperlipidemia 08/09/2011  . Arm pain 06/28/2011  . Diabetes mellitus ([place]) 05/05/2010  . OSA on CPAP 05/05/2010  . HTN (hypertension) 05/05/2010  . Edema 05/05/2010  . HYPERTRIGLYCERIDEMIA 06/08/2008  . Exertional dyspnea 06/08/2008    Past Surgical History:  Procedure Laterality Date  . ABDOMINAL HYSTERECTOMY     with BSo  . CARDIAC CATHETERIZATION  2013   Normal  . CARPAL TUNNEL RELEASE Bilateral   . CATARACT EXTRACTION, BILATERAL    . CHOLECYSTECTOMY    . COLONOSCOPY    . DIAGNOSTIC LAPAROSCOPY     multiple times  . DILATION AND CURETTAGE OF UTERUS    . ESOPHAGOGASTRODUODENOSCOPY (EGD) WITH PROPOFOL N/A 11/01/2014   Procedure: ESOPHAGOGASTRODUODENOSCOPY (EGD) WITH PROPOFOL;  Surgeon: Hulen Luster, MD;  Location: [facility] ENDOSCOPY;  Service: Gastroenterology;  Laterality: N/A;  .  HARDWARE REMOVAL Left 10/11/2016   Procedure: HARDWARE REMOVAL-LEFT RADIUS;  Surgeon: Lovell Sheehan, MD;  Location: [facility] ORS;  Service: Orthopedics;  Laterality: Left;  Left Radius Wrist   . IMPLANTABLE CONTACT LENS IMPLANTATION     bilateral  . LAMINECTOMY  11/13/2015  . LEFT HEART CATH AND CORONARY ANGIOGRAPHY Left 10/01/2017   Procedure: LEFT HEART CATH AND CORONARY ANGIOGRAPHY;  Surgeon: Nelva Bush, MD;  Location: [place] CV LAB;  Service: Cardiovascular;  Laterality: Left;  . LOOP RECORDER INSERTION N/A 10/17/2017   Procedure: LOOP RECORDER INSERTION;  Surgeon: Deboraha Sprang, MD;  Location: [place] CV LAB;  Service: Cardiovascular;  Laterality: N/A;  . LUMBAR FUSION  11/2015  . LUMBAR WOUND DEBRIDEMENT N/A 12/02/2015   Procedure: WOUND Exploration;  Surgeon: Consuella Lose, MD;  Location: [place];  Service: Neurosurgery;  Laterality: N/A;  . OPEN REDUCTION INTERNAL FIXATION (ORIF) DISTAL RADIAL FRACTURE Left 08/29/2016   Procedure: OPEN REDUCTION INTERNAL FIXATION (ORIF) DISTAL RADIAL FRACTURE;  Surgeon: Lovell Sheehan, MD;  Location: [facility] ORS;  Service: Orthopedics;  Laterality: Left;  . PICC LINE PLACE PERIPHERAL ([place] HX)     right upper arm   . ROTATOR CUFF REPAIR Left   . SAVORY DILATION N/A 11/01/2014   Procedure: SAVORY DILATION;  Surgeon: Hulen Luster, MD;  Location[facility]OSCOPY;  Service: Gastroenterology;  Laterality: N/A;  . TONSILLECTOMY    . TRIGGER FINGER RELEASE Bilateral     Prior to Admission medications   Medication Sig Start Date End Date Taking? Authorizing Provider  acetaminophen (TYLENOL) 650 MG CR tablet Take 650 mg by mouth 2 (two) times daily.    [provider]  albuterol (PROVENTIL HFA;VENTOLIN HFA) 108 (90 Base) MCG/ACT inhaler Inhale 2 puffs into the lungs every 6 (six) hours as needed for wheezing or shortness of breath. 01/09/17   Alisa Graff, FNP  Alirocumab (PRALUENT) 150 MG/ML SOPN Inject 150 mg into the skin every 14  (fourteen) days. 07/27/17   Bettey Costa, MD  aspirin 81 MG tablet Take 81 mg by mouth at bedtime.     [provider]  atenolol (TENORMIN) 25 MG tablet Take 0.5 tablets (12.5 mg total) by mouth daily. 01/06/18   Minna Merritts, MD  azelastine (ASTELIN) 0.1 % nasal spray Place 2 sprays into both nostrils at bedtime as needed for rhinitis.     [provider]  Cholecalciferol (VITAMIN D) 2000 units tablet Take 2,000 Units by mouth daily.    [provider]  gabapentin (NEURONTIN) 300 MG capsule Take 300 mg by mouth 2 (two) times daily.    [provider]  HYDROcodone-acetaminophen [facility]) [phone]  MG tablet Take 1 tablet by mouth every 6 (six) hours as needed for up to 15 doses for severe pain. 02/22/18   Darel Hong, MD  hyoscyamine (LEVSIN SL) 0.125 MG SL tablet Place 2 tablets (0.25 mg total) under the tongue every 4 (four) hours as needed for cramping. 06/25/16   Theodoro Grist, MD  insulin aspart (NOVOLOG) 100 UNIT/ML FlexPen Inject 10-16 Units into the skin See admin instructions. 10 units with breakfast, 10 units with lunch, 16 units at dinner, >200 increase by 2 units. 03/01/15 12/04/18  [provider]  insulin glargine (LANTUS) 100 UNIT/ML injection Inject 32 Units into the skin every morning.     [provider]  ipratropium-albuterol (DUONEB) 0.5-2.5 (3) MG/3ML SOLN Take 3 mLs by nebulization every 6 (six) hours as needed. Patient taking differently: Take 3 mLs by nebulization every 6 (six) hours as needed (for shortness of breath/wheezing).  07/27/17   Bettey Costa, MD  JARDIANCE 25 MG TABS tablet  12/17/17   [provider]  levothyroxine (SYNTHROID, LEVOTHROID) 75 MCG tablet Take 75 mcg by mouth daily before breakfast.  11/24/12   [provider]  nitroGLYCERIN (NITROSTAT) 0.4 MG SL tablet Place 1 tablet (0.4 mg total) under the tongue every 5 (five) minutes as needed for chest pain. 03/06/16 12/04/18  Minna Merritts,  MD  ondansetron (ZOFRAN) 4 MG tablet Take 4 mg by mouth every 8 (eight) hours as needed for nausea or vomiting.  07/19/17   [provider]  pantoprazole (PROTONIX) 40 MG tablet Take 40 mg by mouth 2 (two) times daily before a meal.    [provider]  potassium chloride SA (K-DUR,KLOR-CON) 20 MEQ tablet Take 2 tablets (40 mEq total) by mouth daily. 01/06/18   Minna Merritts, MD  ranolazine (RANEXA) 1000 MG SR tablet Take 1 tablet (1,000 mg total) by mouth 2 (two) times daily. 06/13/17   Minna Merritts, MD  ropinirole (REQUIP) 5 MG tablet Take 5 mg by mouth 2 (two) times daily.     [provider]  temazepam (RESTORIL) 30 MG capsule Take 30 mg by mouth at bedtime.    [provider]  torsemide (DEMADEX) 20 MG tablet Take 40 mg by mouth daily.     [provider]  traMADol (ULTRAM) 50 MG tablet Take 50 mg by mouth every 6 (six) hours as needed for moderate pain or severe pain.    [provider]     Allergies Reglan [metoclopramide]; Zetia [ezetimibe]; Ceftin [cefuroxime axetil]; Codeine; Penicillins; and Vicodin [hydrocodone-acetaminophen]  Family History  Problem Relation Age of Onset  . Heart disease Mother   . Breast cancer Mother 53  . Heart disease Father   . Heart attack Sister   . Heart disease Sister   . Heart disease Brother     Social History Social History   Tobacco Use  . Smoking status: Never Smoker  . Smokeless tobacco: Never Used  Substance Use Topics  . Alcohol use: No  . Drug use: No    Review of Systems  Constitutional: No fever/chills Eyes: No visual changes.  ENT: No sore throat. Cardiovascular: As above Respiratory: As above Gastrointestinal: No abdominal pain.  No nausea, no vomiting.   Genitourinary: Negative for dysuria. Musculoskeletal: As above Skin: Negative for rash. Neurological: Negative for headaches or weakness   ____________________________________________   PHYSICAL EXAM:   VITAL SIGNS: ED Triage Vitals  Enc Vitals Group     BP 05/16/18 1540 (!) 156/57  Pulse Rate 05/16/18 1540 80     Resp 05/16/18 1540 18     Temp 05/16/18 1540 97.7 F (36.5 C)     Temp Source 05/16/18 1540 Oral     SpO2 05/16/18 1540 100 %     Weight 05/16/18 1538 93 kg (205 lb)     Height 05/16/18 1538 1.549 m (5\' 1" )     Head Circumference --      Peak Flow --      Pain Score 05/16/18 1538 7     Pain Loc --      Pain Edu? --      Excl. in [place]? --     Constitutional: Alert and oriented.  Eyes: Conjunctivae are normal.   Nose: No congestion/rhinnorhea. Mouth/Throat: Mucous membranes are moist.    Cardiovascular: Normal rate, regular rhythm. Grossly normal heart sounds.  Good peripheral circulation. Respiratory: Normal respiratory effort.  No retractions. Lungs CTAB. Gastrointestinal: Soft and nontender. No distention.  No CVA tenderness. Genitourinary: deferred Musculoskeletal: No lower extremity tenderness nor edema.  Upper left extremity is warm and well perfused with normal pulses, no tenderness palpation, no bruising swelling or painful range of motion.  No chest wall tenderness either.  No rash.  No neck pain Neurologic:  Normal speech and language. No gross focal neurologic deficits are appreciated.  Skin:  Skin is warm, dry and intact. No rash noted. Psychiatric: Mood and affect are normal. Speech and behavior are normal.  ____________________________________________   LABS (all labs ordered are listed, but only abnormal results are displayed)  Labs Reviewed  BASIC METABOLIC PANEL - Abnormal; Notable for the following components:      Result Value   Potassium 3.2 (*)    Glucose, Bld 151 (*)    Creatinine, Ser 1.30 (*)    GFR calc non Af Amer 41 (*)    GFR calc Af Amer 47 (*)    All other components within normal limits  CBC  TROPONIN I  GLUCOSE, CAPILLARY   ____________________________________________  EKG  ED ECG REPORT I, Lavonia Drafts, the  attending physician, personally viewed and interpreted this ECG.  Date: 05/16/2018  Rhythm: normal sinus rhythm QRS Axis: normal Intervals: normal ST/T Wave abnormalities: normal Narrative Interpretation: no evidence of acute ischemia  ____________________________________________  RADIOLOGY  Chest x-ray unremarkable ____________________________________________   PROCEDURES  Procedure(s) performed: No  Procedures   Critical Care performed: No ____________________________________________   INITIAL IMPRESSION / ASSESSMENT AND PLAN / ED COURSE  Pertinent labs & imaging results that were available during my care of the patient were reviewed by me and considered in my medical decision making (see chart for details).  Patient well-appearing and in no acute distress.  Complains primarily of left arm pain which she is worried is an anginal equivalent, and has responded nicely to nitroglycerin yesterday however today did not respond to sublingual nitroglycerin.  Certainly on the differential is angina, muscle injury, electrolyte abnormality, less likely DVT.  Exam is overall quite reassuring, EKG unremarkable.  Pending labs  We will treat with Nitropaste to see if this improves her pain  Troponin is normal.  Nitropaste was somewhat helpful but she continues to have some discomfort, will give IV morphine IV Zofran  Little improvement with morphine, Toradol IV given, CT angiography of the chest obtained which is quite reassuring.  Patient had complete resolution of pain after Toradol  Given resolution of pain and reassuring work-up patient is appropriate for discharge at this time, she agrees with  this plan, she knows to return if any change in her symptoms    ____________________________________________   FINAL CLINICAL IMPRESSION(S) / ED DIAGNOSES  Final diagnoses:  Pain of left upper extremity        Note:  This document was prepared using Dragon voice recognition  software and may include unintentional dictation errors.   Lavonia Drafts, MD 05/16/18 2031

## 2018-05-16 NOTE — Progress Notes (Addendum)
Virtual Visit via Video Note   This visit type was conducted due to national recommendations for restrictions regarding the COVID-19 Pandemic (e.g. social distancing) in an effort to limit this patient's exposure and mitigate transmission in our community.  Due to her co-morbid illnesses, this patient is at least at moderate risk for complications without adequate follow up.  This format is felt to be most appropriate for this patient at this time.  All issues noted in this document were discussed and addressed.  A limited physical exam was performed with this format.  Please refer to the patient's chart for her consent to telehealth for [facility].    Date:  05/16/2018   ID:  [name], DOB 08-Dec-1945, MRN [id number]  Patient Location:  [place] [address]   Provider location:   [facility], [place] office  PCP:  Rusty Aus, MD  Cardiologist:  Patsy Baltimore  Chief Complaint:  Chest pain/unstable angina  WEBcam tele visit   History of Present Illness:    [name] is a 73 y.o. female who presents via audio/video conferencing for a telehealth visit today.   The patient does not symptoms concerning for COVID-19 infection (fever, chills, cough, or new SHORTNESS OF BREATH).   Patient has a past medical history of CAD,  cardiac catheterization in 2009 and June 2013, moderate LAD, diagonal and RCA disease, Obesity,  diabetes,  hyperlipidemia with statin intolerance, on praluent obstructive sleep apnea on CPAP,  Tachycardia episodes,  previous leg edema,  seen in the hospital for severe hypertension and chest pain,  EF 60% in 07/2016 who presents for routine followup of her coronary artery disease and diastolic CHF  Webcam visit this afternoon, urgent add-on for chest pain Symptoms started last night around suppertime Pain in the left arm Seem to resolve with nitro  Woke up middle of the night, took NTG again,  again symptoms resolved  Recurrence of her symptoms this morning Sounds like chest and deep throbbing left arm  Chest burning going through the chest to the back NTG x 2 not helping  Sitting there for much of today with 7-8 over 10 chest pain left arm pain wondering what to do  On WebCam visit she appears visibly uncomfortable Reports having some slight worsening of her shortness of breath even at rest sitting  Very concerned could be a cardiac issue  She does have a history of syncope Previous monitor with  atrial tachycardia with documented Pauses.  --loop monitor placed Has not had any further episodes of near syncope or syncope   Prior CV studies:   The following studies were reviewed today:  Cath: 10/01/2017 1. Relatively small LAD with moderate mid vessel disease. 2. Small D1 with 80% ostial/proximal stenosis. 3. Large, dominant LCx without significant disease. 4. Small, non-dominant RCA. 5. Moderately elevated LVEDP. RECOMMENDATIONS: 1. Medical therapy for LAD/diagonal disease.   Past Medical History:  Diagnosis Date  . Anginal pain ([place])   . Anxiety   . Arthritis   . CHF (congestive heart failure) ([place])   . Chronic back pain    stenosis.degenerative disc,some scoliosis  . Constipation    takes Stool Softener daily  . Coronary artery disease   . Depression    takes Cymbalta daily  . Diabetes mellitus    Type 2 diabetic. Average fasting blood sugar runs high 170-200  . Diastolic CHF ([place]) 78/67/6720   Per patient, diagnosed in 2018  .  Diverticulosis   . E coli infection   . GERD (gastroesophageal reflux disease)    takes Nexium daily  . Headache   . Headache 02/16/2016  . Hemorrhoids   . History of colon polyps    benign  . History of gout    doesn't take any meds  . History of hiatal hernia   . History of vertigo    doesn't take any meds  . Hyperlipidemia    takes Praluent daily  . Hypertension    currently BP medications are on hold   .  Hypothyroidism    takes Synthroid daily  . Insomnia    takes Restoril nightly  . Joint pain   . Joint swelling   . Muscle spasm    takes Robaxin as needed  . NSVT (nonsustained ventricular tachycardia) ([place])   . OSA on CPAP   . Periodic heart flutter   . Peripheral edema    takes LAsix as needed  . Peripheral vascular disease ([place])    AAA as stated per pt / was just discovered and pt states has not been referred to vascular MD   . Restless leg    takes Requip daily  . Rosacea   . Sleep apnea   . Urinary incontinence   . Urinary urgency   . Varicose veins   . Weakness    numbness and tingling.   Past Surgical History:  Procedure Laterality Date  . ABDOMINAL HYSTERECTOMY     with BSo  . CARDIAC CATHETERIZATION  2013   Normal  . CARPAL TUNNEL RELEASE Bilateral   . CATARACT EXTRACTION, BILATERAL    . CHOLECYSTECTOMY    . COLONOSCOPY    . DIAGNOSTIC LAPAROSCOPY     multiple times  . DILATION AND CURETTAGE OF UTERUS    . ESOPHAGOGASTRODUODENOSCOPY (EGD) WITH PROPOFOL N/A 11/01/2014   Procedure: ESOPHAGOGASTRODUODENOSCOPY (EGD) WITH PROPOFOL;  Surgeon: Hulen Luster, MD;  Location: [facility] ENDOSCOPY;  Service: Gastroenterology;  Laterality: N/A;  . HARDWARE REMOVAL Left 10/11/2016   Procedure: HARDWARE REMOVAL-LEFT RADIUS;  Surgeon: Lovell Sheehan, MD;  Location: [facility] ORS;  Service: Orthopedics;  Laterality: Left;  Left Radius Wrist   . IMPLANTABLE CONTACT LENS IMPLANTATION     bilateral  . LAMINECTOMY  11/13/2015  . LEFT HEART CATH AND CORONARY ANGIOGRAPHY Left 10/01/2017   Procedure: LEFT HEART CATH AND CORONARY ANGIOGRAPHY;  Surgeon: Nelva Bush, MD;  Location: [place] [facility] CV LAB;  Service: Cardiovascular;  Laterality: Left;  . LOOP RECORDER INSERTION N/A 10/17/2017   Procedure: LOOP RECORDER INSERTION;  Surgeon: Deboraha Sprang, MD;  Location: [place] CV LAB;  Service: Cardiovascular;  Laterality: N/A;  . LUMBAR FUSION  11/2015  . LUMBAR WOUND DEBRIDEMENT N/A  12/02/2015   Procedure: WOUND Exploration;  Surgeon: Consuella Lose, MD;  Location: [facility];  Service: Neurosurgery;  Laterality: N/A;  . OPEN REDUCTION INTERNAL FIXATION (ORIF) DISTAL RADIAL FRACTURE Left 08/29/2016   Procedure: OPEN REDUCTION INTERNAL FIXATION (ORIF) DISTAL RADIAL FRACTURE;  Surgeon: Lovell Sheehan, MD;  Location: [facility] ORS;  Service: Orthopedics;  Laterality: Left;  . PICC LINE PLACE PERIPHERAL ([place] HX)     right upper arm   . ROTATOR CUFF REPAIR Left   . SAVORY DILATION N/A 11/01/2014   Procedure: SAVORY DILATION;  Surgeon: Hulen Luster, MD;  Location: [facility] ENDOSCOPY;  Service: Gastroenterology;  Laterality: N/A;  . TONSILLECTOMY    . TRIGGER FINGER RELEASE Bilateral      No outpatient medications have been  marked as taking for the 05/16/18 encounter (Appointment) with Minna Merritts, MD.     Allergies:   Reglan [metoclopramide]; Zetia [ezetimibe]; Ceftin [cefuroxime axetil]; Codeine; Penicillins; and Vicodin [hydrocodone-acetaminophen]   Social History   Tobacco Use  . Smoking status: Never Smoker  . Smokeless tobacco: Never Used  Substance Use Topics  . Alcohol use: No  . Drug use: No     Current Outpatient Medications on File Prior to Visit  Medication Sig Dispense Refill  . acetaminophen (TYLENOL) 650 MG CR tablet Take 650 mg by mouth 2 (two) times daily.    [name] albuterol (PROVENTIL HFA;VENTOLIN HFA) 108 (90 Base) MCG/ACT inhaler Inhale 2 puffs into the lungs every 6 (six) hours as needed for wheezing or shortness of breath. 3 Inhaler 3  . Alirocumab (PRALUENT) 150 MG/ML SOPN Inject 150 mg into the skin every 14 (fourteen) days.    [name] aspirin 81 MG tablet Take 81 mg by mouth at bedtime.     [name] atenolol (TENORMIN) 25 MG tablet Take 0.5 tablets (12.5 mg total) by mouth daily. 45 tablet 4  . azelastine (ASTELIN) 0.1 % nasal spray Place 2 sprays into both nostrils at bedtime as needed for rhinitis.     . Cholecalciferol (VITAMIN D) 2000 units tablet Take 2,000 Units  by mouth daily.    [name] gabapentin (NEURONTIN) 300 MG capsule Take 300 mg by mouth 2 (two) times daily.    [name] HYDROcodone-acetaminophen (NORCO) 5-325 MG tablet Take 1 tablet by mouth every 6 (six) hours as needed for up to 15 doses for severe pain. 15 tablet 0  . hyoscyamine (LEVSIN SL) 0.125 MG SL tablet Place 2 tablets (0.25 mg total) under the tongue every 4 (four) hours as needed for cramping. 30 tablet 0  . insulin aspart (NOVOLOG) 100 UNIT/ML FlexPen Inject 10-16 Units into the skin See admin instructions. 10 units with breakfast, 10 units with lunch, 16 units at dinner, >200 increase by 2 units.    . insulin glargine (LANTUS) 100 UNIT/ML injection Inject 32 Units into the skin every morning.     [name] ipratropium-albuterol (DUONEB) 0.5-2.5 (3) MG/3ML SOLN Take 3 mLs by nebulization every 6 (six) hours as needed. (Patient taking differently: Take 3 mLs by nebulization every 6 (six) hours as needed (for shortness of breath/wheezing). )    . JARDIANCE 25 MG TABS tablet     . levothyroxine (SYNTHROID, LEVOTHROID) 75 MCG tablet Take 75 mcg by mouth daily before breakfast.     . nitroGLYCERIN (NITROSTAT) 0.4 MG SL tablet Place 1 tablet (0.4 mg total) under the tongue every 5 (five) minutes as needed for chest pain. 25 tablet 3  . ondansetron (ZOFRAN) 4 MG tablet Take 4 mg by mouth every 8 (eight) hours as needed for nausea or vomiting.     . pantoprazole (PROTONIX) 40 MG tablet Take 40 mg by mouth 2 (two) times daily before a meal.    . potassium chloride SA (K-DUR,KLOR-CON) 20 MEQ tablet Take 2 tablets (40 mEq total) by mouth daily. 180 tablet 3  . ranolazine (RANEXA) 1000 MG SR tablet Take 1 tablet (1,000 mg total) by mouth 2 (two) times daily. 180 tablet 3  . ropinirole (REQUIP) 5 MG tablet Take 5 mg by mouth 2 (two) times daily.     . temazepam (RESTORIL) 30 MG capsule Take 30 mg by mouth at bedtime.    . torsemide (DEMADEX) 20 MG tablet Take 40 mg by mouth daily.     [name]  traMADol (ULTRAM) 50 MG tablet  Take 50 mg by mouth every 6 (six) hours as needed for moderate pain or severe pain.     Current Facility-Administered Medications on File Prior to Visit  Medication Dose Route Frequency Provider Last Rate Last Dose  . dexamethasone (DECADRON) injection 10 mg  10 mg Intravenous Once Lovell Sheehan, MD         Family Hx: The patient's family history includes Breast cancer (age of onset: 48) in her mother; Heart attack in her sister; Heart disease in her brother, father, mother, and sister.  ROS:   Please see the history of present illness.    Review of Systems  Constitutional: Negative.   Respiratory: Positive for shortness of breath.   Cardiovascular: Positive for chest pain.       Left arm pain  Gastrointestinal: Negative.   Musculoskeletal: Negative.   Neurological: Negative.   Psychiatric/Behavioral: Negative.   All other systems reviewed and are negative.     Labs/Other Tests and Data Reviewed:    Recent Labs: 07/17/2017: B Natriuretic Peptide 29.0 02/22/2018: ALT 16; BUN 26; Creatinine, Ser 1.32; Hemoglobin 12.5; Platelets 275; Potassium 3.6; Sodium 141   Recent Lipid Panel Lab Results  Component Value Date/Time   CHOL 163 07/26/2015 08:02 AM   TRIG 187 (H) 07/26/2015 08:02 AM   HDL 32 (L) 07/26/2015 08:02 AM   CHOLHDL 5.1 (H) 07/26/2015 08:02 AM   CHOLHDL 3.8 Ratio 06/10/2008 10:32 PM   LDLCALC 94 07/26/2015 08:02 AM    Wt Readings from Last 3 Encounters:  02/22/18 210 lb (95.3 kg)  01/28/18 213 lb 12 oz (97 kg)  01/06/18 215 lb (97.5 kg)     Exam:    Vital Signs: Vital signs as detailed above in HPI  Well nourished, well developed female moderate distress Constitutional:  oriented to person, place, and time. No distress.  Head: Normocephalic and atraumatic.  Eyes:  no discharge. No scleral icterus.  Neck: Normal range of motion. Neck supple.  Pulmonary/Chest: No audible wheezing, appears uncomfortable Musculoskeletal: Normal range of motion.  no   tenderness or deformity.  Neurological:   Coordination normal. Full exam not performed Skin:  No rash Psychiatric:  normal mood and affect. behavior is normal. Thought content normal.    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 Saunders Revel.  Prior cardiac catheterization images reviewed in the office  Type 2 diabetes mellitus with stage 3 chronic kidney disease, with long-term current use of insulin ([place]) Relatively well controlled, most recent hemoglobin A1c 9 months ago 5.8 Difficulty exercising secondary to debilities  Atrial tachycardia ([place]) Loop monitor in place, denies having recent tachycardia  Chronic diastolic heart failure ([place]) She does report some shortness of breath but this is relatively acute Unable to exclude pulmonary edema X-ray and consider BNP in the hospital if indicated  Exertional dyspnea Chronic baseline deconditioning Obesity contributing   COVID-19 Education: The signs and symptoms of COVID-19 were discussed with the patient and how to seek care for testing (follow up  with PCP or arrange E-visit).  The importance of social distancing was discussed today.  Patient Risk:   After full review of this patients clinical status, I feel that they are at least moderate risk at this time.  Time:   Today, I have spent 25 minutes with the patient with telehealth technology discussing unstable angina symptoms, known coronary artery disease..      Medication Adjustments/Labs and Tests Ordered: Current medicines are reviewed at length with the patient today.  Concerns regarding medicines are outlined above.   Tests Ordered: No tests ordered   Medication Changes: No changes made   Disposition: Follow-up in 1 months   Signed, Ida Rogue, MD  05/16/2018 3:00 PM    [facility] [address], [place], [place] [address]

## 2018-06-04 ENCOUNTER — Ambulatory Visit: Payer: Medicare Other | Admitting: Family

## 2018-06-05 ENCOUNTER — Other Ambulatory Visit: Payer: Self-pay

## 2018-06-05 ENCOUNTER — Ambulatory Visit (INDEPENDENT_AMBULATORY_CARE_PROVIDER_SITE_OTHER): Payer: Medicare Other | Admitting: *Deleted

## 2018-06-05 DIAGNOSIS — R55 Syncope and collapse: Secondary | ICD-10-CM | POA: Diagnosis not present

## 2018-06-05 LAB — CUP PACEART REMOTE DEVICE CHECK
Date Time Interrogation Session: 20200423144156
Implantable Pulse Generator Implant Date: 20190905

## 2018-06-13 ENCOUNTER — Telehealth: Payer: Self-pay | Admitting: Nurse Practitioner

## 2018-06-13 ENCOUNTER — Telehealth: Payer: Self-pay | Admitting: Cardiology

## 2018-06-13 ENCOUNTER — Encounter: Payer: Self-pay | Admitting: Nurse Practitioner

## 2018-06-13 NOTE — Telephone Encounter (Signed)
Dr Caryl Comes and Nira Conn - please see other phone note from today with pause episode attached.

## 2018-06-13 NOTE — Progress Notes (Signed)
Carelink Summary Report / Loop Recorder 

## 2018-06-13 NOTE — Telephone Encounter (Signed)
Noted and appreciate added information will see Telehealth visit

## 2018-06-13 NOTE — Telephone Encounter (Signed)
Patient called back and stated that she remembered what happened on 06/11/2018. She stated that got up at 10:00 and she was feeling very nausea, and went down on her knees in the bathroom and leaned over the toilet and she vomited. She stated that every time this happens she passes out.

## 2018-06-13 NOTE — Telephone Encounter (Signed)
Can you plz arrange Telehealth visit    Next Wednesday

## 2018-06-13 NOTE — Telephone Encounter (Signed)
Patient with pause episode 4/29 at 22:45PM - period of 2:1 heart block followed by short period of complete heart block. Spoke with patient. She was unable to sleep well that night. She has been having increased leg pain. She also has chronic orthostatic intolerance but does not remember specific dizziness that day.  She reports compliance with CPAP.  I advised will send to [facility] and Dr Caryl Comes for review - she is open to a virtual visit.    Chanetta Marshall, NP 06/13/2018 10:41 AM

## 2018-06-16 ENCOUNTER — Telehealth: Payer: Self-pay | Admitting: Internal Medicine

## 2018-06-16 ENCOUNTER — Other Ambulatory Visit: Payer: Self-pay

## 2018-06-16 ENCOUNTER — Telehealth (INDEPENDENT_AMBULATORY_CARE_PROVIDER_SITE_OTHER): Payer: Medicare Other | Admitting: Internal Medicine

## 2018-06-16 VITALS — BP 129/76 | HR 91 | Temp 98.3°F | Ht 61.0 in | Wt 205.0 lb

## 2018-06-16 DIAGNOSIS — R079 Chest pain, unspecified: Secondary | ICD-10-CM

## 2018-06-16 DIAGNOSIS — R55 Syncope and collapse: Secondary | ICD-10-CM | POA: Diagnosis not present

## 2018-06-16 DIAGNOSIS — R0609 Other forms of dyspnea: Secondary | ICD-10-CM

## 2018-06-16 NOTE — Telephone Encounter (Signed)
I spoke with Dr. Caryl Comes and the patient. OK to add on for today at 10:00 am.

## 2018-06-16 NOTE — Telephone Encounter (Signed)
I spoke with the patient. She is agreeable with a video visit with Dr. Caryl Comes this morning at 10 am.

## 2018-06-16 NOTE — Patient Instructions (Signed)
Medication Instructions:  - Your physician recommends that you continue on your current medications as directed. Please refer to the Current Medication list given to you today.  If you need a refill on your cardiac medications before your next appointment, please call your pharmacy.   Lab work: - none ordered  If you have labs (blood work) drawn today and your tests are completely normal, you will receive your results only by: [name] MyChart Message (if you have MyChart) OR . A paper copy in the mail If you have any lab test that is abnormal or we need to change your treatment, we will call you to review the results.  Testing/Procedures: - none ordered  Follow-Up: At [facility], you and your health needs are our priority.  As part of our continuing mission to provide you with exceptional heart care, we have created designated Provider Care Teams.  These Care Teams include your primary Cardiologist (physician) and Advanced Practice Providers (APPs -  Physician Assistants and Nurse Practitioners) who all work together to provide you with the care you need, when you need it.  . You will need a follow up appointment in 3 months with Dr. Caryl Comes. .  .  Please call our office 2 months in advance to schedule this appointment.  (call in early June to schedule)   Any Other Special Instructions Will Be Listed Below (If Applicable).  - Please follow up with Dr. Sabra Heck in regards to possible causes of your nausea

## 2018-06-16 NOTE — Progress Notes (Signed)
Electrophysiology TeleHealth Note   Due to national recommendations of social distancing due to COVID 19, an audio/video telehealth visit is felt to be most appropriate for this patient at this time.  See MyChart message from today for the patient's consent to telehealth for [facility].   Date:  06/16/2018   ID:  [name], DOB [birth year], MRN [id number]  Location: patient's home  Provider location: [address] [place]  Evaluation Performed: Follow-up visit  PCP:  Rusty Aus, MD  Cardiologist:    Electrophysiologist:  SK   Chief Complaint:  *syncope  History of Present Illness:    [name] is a 73 y.o. female who presents via audio/video conferencing for a telehealth visit today.  Since last being seen in our clinic, the patient reports RECURRENT spells of syncope   She has had recurrent syncope assoc with nausea and vomiting-  Assoc with residual orthostatic hypotension and fatigue.  Described as pale  Ongoing and increasingly frequent for about 3-4 yrs in the context of "flu like illness,"  But other episodes are sporadic, they are all preceded by nausea and assoc with recovery fatigue.  GI eval incomplete   Has tried zofran with perhaps some benefit.  S/p CCK  Interval Left arm and chest pain to see Dr Deidre Ala on Wed   chronic sob some edema     The patient denies symptoms of fevers, chills, cough, or new SOB worrisome for COVID 19.  Past Medical History:  Diagnosis Date  . Anxiety   . Arthritis   . CHF (congestive heart failure) ([place])   . Chronic back pain    stenosis.degenerative disc,some scoliosis  . Constipation    takes Stool Softener daily  . Coronary artery disease   . Depression    takes Cymbalta daily  . Diabetes mellitus    Type 2 diabetic. Average fasting blood sugar runs high 170-200  . Diastolic CHF ([place]) 74/25/9563   Per patient, diagnosed in 2018  . Diverticulosis   . E coli infection   . GERD  (gastroesophageal reflux disease)    takes Nexium daily  . Headache   . Hemorrhoids   . History of colon polyps    benign  . History of gout    doesn't take any meds  . History of hiatal hernia   . History of vertigo    doesn't take any meds  . Hyperlipidemia    takes Praluent daily  . Hypertension    currently BP medications are on hold   . Hypothyroidism    takes Synthroid daily  . Insomnia    takes Restoril nightly  . Muscle spasm    takes Robaxin as needed  . NSVT (nonsustained ventricular tachycardia) ([place])   . OSA on CPAP   . Peripheral vascular disease ([place])    AAA as stated per pt / was just discovered and pt states has not been referred to vascular MD   . Restless leg    takes Requip daily  . Rosacea   . Varicose veins     Past Surgical History:  Procedure Laterality Date  . ABDOMINAL HYSTERECTOMY     with BSo  . CARDIAC CATHETERIZATION  2013   Normal  . CARPAL TUNNEL RELEASE Bilateral   . CATARACT EXTRACTION, BILATERAL    . CHOLECYSTECTOMY    . COLONOSCOPY    . DIAGNOSTIC LAPAROSCOPY     multiple times  . DILATION AND CURETTAGE OF  UTERUS    . ESOPHAGOGASTRODUODENOSCOPY (EGD) WITH PROPOFOL N/A 11/01/2014   Procedure: ESOPHAGOGASTRODUODENOSCOPY (EGD) WITH PROPOFOL;  Surgeon: Hulen Luster, MD;  Location: [facility] ENDOSCOPY;  Service: Gastroenterology;  Laterality: N/A;  . HARDWARE REMOVAL Left 10/11/2016   Procedure: HARDWARE REMOVAL-LEFT RADIUS;  Surgeon: Lovell Sheehan, MD;  Location: [facility] ORS;  Service: Orthopedics;  Laterality: Left;  Left Radius Wrist   . IMPLANTABLE CONTACT LENS IMPLANTATION     bilateral  . LAMINECTOMY  11/13/2015  . LEFT HEART CATH AND CORONARY ANGIOGRAPHY Left 10/01/2017   Procedure: LEFT HEART CATH AND CORONARY ANGIOGRAPHY;  Surgeon: Nelva Bush, MD;  Location: [place] CV LAB;  Service: Cardiovascular;  Laterality: Left;  . LOOP RECORDER INSERTION N/A 10/17/2017   Procedure: LOOP RECORDER INSERTION;  Surgeon: Deboraha Sprang, MD;   Location: [place] CV LAB;  Service: Cardiovascular;  Laterality: N/A;  . LUMBAR FUSION  11/2015  . LUMBAR WOUND DEBRIDEMENT N/A 12/02/2015   Procedure: WOUND Exploration;  Surgeon: Consuella Lose, MD;  Location: [place];  Service: Neurosurgery;  Laterality: N/A;  . OPEN REDUCTION INTERNAL FIXATION (ORIF) DISTAL RADIAL FRACTURE Left 08/29/2016   Procedure: OPEN REDUCTION INTERNAL FIXATION (ORIF) DISTAL RADIAL FRACTURE;  Surgeon: Lovell Sheehan, MD;  Location: [facility] ORS;  Service: Orthopedics;  Laterality: Left;  . PICC LINE PLACE PERIPHERAL ([place] HX)     right upper arm   . ROTATOR CUFF REPAIR Left   . SAVORY DILATION N/A 11/01/2014   Procedure: SAVORY DILATION;  Surgeon: Hulen Luster, MD;  Location: [facility] ENDOSCOPY;  Service: Gastroenterology;  Laterality: N/A;  . TONSILLECTOMY    . TRIGGER FINGER RELEASE Bilateral     Current Outpatient Medications  Medication Sig Dispense Refill  . acetaminophen (TYLENOL) 650 MG CR tablet Take 650 mg by mouth 2 (two) times daily.    [name] albuterol (PROVENTIL HFA;VENTOLIN HFA) 108 (90 Base) MCG/ACT inhaler Inhale 2 puffs into the lungs every 6 (six) hours as needed for wheezing or shortness of breath. 3 Inhaler 3  . Alirocumab (PRALUENT) 150 MG/ML SOPN Inject 150 mg into the skin every 14 (fourteen) days.    [name] aspirin 81 MG tablet Take 81 mg by mouth at bedtime.     [name] atenolol (TENORMIN) 25 MG tablet Take 0.5 tablets (12.5 mg total) by mouth daily. 45 tablet 4  . azelastine (ASTELIN) 0.1 % nasal spray Place 2 sprays into both nostrils at bedtime as needed for rhinitis.     . Cholecalciferol (VITAMIN D) 2000 units tablet Take 2,000 Units by mouth daily.    [name] gabapentin (NEURONTIN) 300 MG capsule Take 300 mg by mouth 2 (two) times daily.    . insulin aspart (NOVOLOG) 100 UNIT/ML FlexPen Inject 10-16 Units into the skin See admin instructions. 10 units with breakfast, 10 units with lunch, 16 units at dinner, >200 increase by 2 units.    . insulin glargine  (LANTUS) 100 UNIT/ML injection Inject 28 Units into the skin every morning.     [name] ipratropium-albuterol (DUONEB) 0.5-2.5 (3) MG/3ML SOLN Take 3 mLs by nebulization every 6 (six) hours as needed. (Patient taking differently: Take 3 mLs by nebulization every 6 (six) hours as needed (for shortness of breath/wheezing). )    . JARDIANCE 25 MG TABS tablet     . levothyroxine (SYNTHROID, LEVOTHROID) 75 MCG tablet Take 75 mcg by mouth daily before breakfast.     . nitroGLYCERIN (NITROSTAT) 0.4 MG SL tablet Place 1 tablet (0.4 mg total) under the tongue  every 5 (five) minutes as needed for chest pain. 25 tablet 3  . ondansetron (ZOFRAN) 4 MG tablet Take 4 mg by mouth every 8 (eight) hours as needed for nausea or vomiting.     . potassium chloride SA (K-DUR,KLOR-CON) 20 MEQ tablet Take 2 tablets (40 mEq total) by mouth daily. 180 tablet 3  . ranolazine (RANEXA) 1000 MG SR tablet Take 1 tablet (1,000 mg total) by mouth 2 (two) times daily. 180 tablet 3  . ropinirole (REQUIP) 5 MG tablet Take 5 mg by mouth 2 (two) times daily.     . temazepam (RESTORIL) 30 MG capsule Take 30 mg by mouth at bedtime.    . torsemide (DEMADEX) 20 MG tablet Take 40 mg by mouth daily.     . traMADol (ULTRAM) 50 MG tablet Take 50 mg by mouth every 6 (six) hours as needed for moderate pain or severe pain.    [name] HYDROcodone-acetaminophen (NORCO) 5-325 MG tablet Take 1 tablet by mouth every 6 (six) hours as needed for up to 15 doses for severe pain. (Patient not taking: Reported on 06/16/2018) 15 tablet 0  . hyoscyamine (LEVSIN SL) 0.125 MG SL tablet Place 2 tablets (0.25 mg total) under the tongue every 4 (four) hours as needed for cramping. (Patient not taking: Reported on 06/16/2018) 30 tablet 0   No current facility-administered medications for this visit.    Facility-Administered Medications Ordered in Other Visits  Medication Dose Route Frequency Provider Last Rate Last Dose  . dexamethasone (DECADRON) injection 10 mg  10 mg  Intravenous Once Lovell Sheehan, MD        Allergies:   Reglan [metoclopramide]; Zetia [ezetimibe]; Ceftin [cefuroxime axetil]; Codeine; Penicillins; and Vicodin [hydrocodone-acetaminophen]   Social History:  The patient  reports that she has never smoked. She has never used smokeless tobacco. She reports that she does not drink alcohol or use drugs.   Family History:  The patient's   family history includes Breast cancer (age of onset: 46) in her mother; Heart attack in her sister; Heart disease in her brother, father, mother, and sister.   ROS:  Please see the history of present illness.   All other systems are personally reviewed and negative.    Exam:    Vital Signs:  BP 129/76   Pulse 91   Temp 98.3 F (36.8 C) (Oral)   Ht 5\' 1"  (1.549 m)   Wt 205 lb (93 kg)   BMI 38.73 kg/m     Well appearing, alert and conversant, regular work of breathing,  good skin color Eyes- anicteric, neuro- grossly intact, skin- no apparent rash or lesions or cyanosis, mouth- oral mucosa is pink   Labs/Other Tests and Data Reviewed:    Recent Labs: 07/17/2017: B Natriuretic Peptide 29.0 02/22/2018: ALT 16 05/16/2018: BUN 21; Creatinine, Ser 1.30; Hemoglobin 12.6; Platelets 281; Potassium 3.2; Sodium 141   Wt Readings from Last 3 Encounters:  06/16/18 205 lb (93 kg)  05/16/18 205 lb (93 kg)  02/22/18 210 lb (95.3 kg)     Other studies personally reviewed: Additional studies/ records that were reviewed today include: As above   Review of the above records today demonstrates: As above  Prior radiographs:      Last device remote is reviewed from [place] PDF dated 4/23 which reveals normal device function,     Another remote was sent which has not yet in the computer which I reviewed last week demonstrated high-grade heart block*   ASSESSMENT &  PLAN:    Syncope and exertional lightheadedness  Atrial tachycardia-nonsustained  Obstructive sleep apnea treated  Nocturnal pausing up to 8  seconds  Orthostatic lightheadedness  Obesity  Diastolic heart failure  Recurrent nausea/vomiting  Coronary artery disease with abnormal FFR>> Cath mod LAD/D1 disease  The patient has had increasingly frequent episodes of nausea frequently accompanied by vomiting and when so almost always associated with syncope.  Her recent episode was documented with intermittent complete heart block.  The duration of the episodes however with a prodrome of nausea that lasts minutes and of nausea and fatigue afterwards that can last hours strongly suggests that the heart block is neurally mediated and not primary.  Hence, the first therapeutic target would be to try to uncover the cause of the nausea.  I will reach out to Dr. Emily Filbert to see if he can facilitate that evaluation.  This could either be a GI problem, possibly diabetic gastroparesis.  I do not think that this represents an ischemic equivalent.  In the event that no mechanism can be elucidated, there may be a role, given the frequency and the associated heart block, for CLS pacing.  Clearly it is not likely completely therapeutic as it would not be expected to resolve the vaso-depression.  Recurrent chest pain.  Seen in the ER with nothing specific last week.  Now is having recurrent "angina ".  Has been taking as needed nitroglycerin.  Anticipated to do telehealth with Dr. Deidre Ala on Wednesday; we will begin empiric daily nitrates to see if that helps.    COVID 19 screen The patient denies symptoms of COVID 19 at this time.  The importance of social distancing was discussed today.  Follow-up:  74m Next remote: As Scheduled   Current medicines are reviewed at length with the patient today.   The patient does not have concerns regarding her medicines.  The following changes were made today: Begin Imdur 30 mg daily  Labs/ tests ordered today include:   No orders of the defined types were placed in this encounter.  I have asked her to  measure her orthostatic vital signs today for [place].   Future tests ( post COVID )     Patient Risk:  after full review of this patients clinical status, I feel that they are at moderate risk at this time.  Today, I have spent 19 minutes with the patient with telehealth technology discussing the above.  Signed, Virl Axe, MD  06/16/2018 10:19 AM     [facility] [address] [place] [address] [phone] (office) [phone] (fax)

## 2018-06-16 NOTE — Telephone Encounter (Signed)

## 2018-06-17 ENCOUNTER — Telehealth: Payer: Medicare Other | Admitting: Cardiovascular Disease

## 2018-06-18 ENCOUNTER — Telehealth (INDEPENDENT_AMBULATORY_CARE_PROVIDER_SITE_OTHER): Payer: Medicare Other | Admitting: Cardiovascular Disease

## 2018-06-18 ENCOUNTER — Other Ambulatory Visit: Payer: Self-pay

## 2018-06-18 VITALS — BP 121/75 | HR 77 | Temp 97.8°F | Ht 61.0 in | Wt 207.0 lb

## 2018-06-18 DIAGNOSIS — Z794 Long term (current) use of insulin: Secondary | ICD-10-CM

## 2018-06-18 DIAGNOSIS — I5032 Chronic diastolic (congestive) heart failure: Secondary | ICD-10-CM | POA: Diagnosis not present

## 2018-06-18 DIAGNOSIS — I471 Supraventricular tachycardia: Secondary | ICD-10-CM

## 2018-06-18 DIAGNOSIS — I10 Essential (primary) hypertension: Secondary | ICD-10-CM | POA: Diagnosis not present

## 2018-06-18 DIAGNOSIS — Z9989 Dependence on other enabling machines and devices: Secondary | ICD-10-CM

## 2018-06-18 DIAGNOSIS — I890 Lymphedema, not elsewhere classified: Secondary | ICD-10-CM

## 2018-06-18 DIAGNOSIS — I25118 Atherosclerotic heart disease of native coronary artery with other forms of angina pectoris: Secondary | ICD-10-CM

## 2018-06-18 DIAGNOSIS — E1159 Type 2 diabetes mellitus with other circulatory complications: Secondary | ICD-10-CM

## 2018-06-18 DIAGNOSIS — N183 Chronic kidney disease, stage 3 unspecified: Secondary | ICD-10-CM

## 2018-06-18 DIAGNOSIS — G4733 Obstructive sleep apnea (adult) (pediatric): Secondary | ICD-10-CM

## 2018-06-18 DIAGNOSIS — I495 Sick sinus syndrome: Secondary | ICD-10-CM

## 2018-06-18 DIAGNOSIS — E1122 Type 2 diabetes mellitus with diabetic chronic kidney disease: Secondary | ICD-10-CM

## 2018-06-18 MED ORDER — NITROGLYCERIN 0.4 MG SL SUBL: 0.4000 mg | SUBLINGUAL_TABLET | SUBLINGUAL | 3 refills | Status: DC | PRN

## 2018-06-18 NOTE — Progress Notes (Signed)
Virtual Visit via Video Note   This visit type was conducted due to national recommendations for restrictions regarding the COVID-19 Pandemic (e.g. social distancing) in an effort to limit this patient's exposure and mitigate transmission in our community.  Due to her co-morbid illnesses, this patient is at least at moderate risk for complications without adequate follow up.  This format is felt to be most appropriate for this patient at this time.  All issues noted in this document were discussed and addressed.  A limited physical exam was performed with this format.  Please refer to the patient's chart for her consent to telehealth for [facility].   I connected with  [name] on 06/18/18 by a video enabled telemedicine application and verified that I am speaking with the correct person using two identifiers. I discussed the limitations of evaluation and management by telemedicine. The patient expressed understanding and agreed to proceed.   Evaluation Performed:  Follow-up visit  Date:  06/18/2018   ID:  [name], DOB [birth year], MRN [id number]  Patient Location:  [place] [address]   Provider location:   [facility], [place] office  PCP:  Rusty Aus, MD  Cardiologist:  Arvid Right [facility]   Chief Complaint:  Syncope, chest pain, arm pain  History of Present Illness:    [name] is a 73 y.o. female who presents via audio/video conferencing for a telehealth visit today.   The patient does not symptoms concerning for COVID-19 infection (fever, chills, cough, or new SHORTNESS OF BREATH).   Patient has a past medical history of CAD,  cardiac catheterization in 2009 , June 2013, August 2019 moderate LAD, diagonal and RCA disease, Obesity,  diabetes,  hyperlipidemia with statin intolerance, on praluent obstructive sleep apnea on CPAP,  Tachycardia episodes,  previous leg edema,  seen in the hospital for severe  hypertension and chest pain,  EF 60% in 07/2016 Loop monitor in place for syncope  recurrent syncope assoc with nausea and vomiting- who presents for routine followup of her coronary artery disease and diastolic CHF  Chronic chest pain, better on Ranexa 500 BID, does not want 1000 BID Chronic fatigue, relies on neighbors to help her Tramadol for back pain, hives, had to stop  05/16/2018 left arm pain, etiology unclear Went to the ER, Work up negative, potassium 3.2 NTG did not help Better with toradol  Had angina since then, more chest pain "Sharp, dull like pain" "Better with NTG  06/11/2018  recurrent syncope assoc with nausea and vomiting Referred back to GI Rare but a problem   Went to the ER 05/16/2018 Left arm pain NTG did not help toradol better  Torsemide 40 daily With potassium one a day Supposed to be on BID, Recent potassium 3.2  On praluent, forgot a week or so, Numbers jumped Hemoglobin A1c 6.6 Total cholesterol 172 LDL 87, previous cholesterol 150 Creatinine 1.2 BUN 22 Normal LFTs   Other past medical hx reviewed Cath: 10/01/2017 1. Relatively small LAD with moderate mid vessel disease. 2. Small D1 with 80% ostial/proximal stenosis. 3. Large, dominant LCx without significant disease. 4. Small, non-dominant RCA. 5. Moderately elevated LVEDP. RECOMMENDATIONS: 1. Medical therapy for LAD/diagonal disease.  syncope atrial tachycardia with documented Pauses. It was presumed that both events occurred while she was sleeping. --loop monitor placed  Hospitalization for chest pain July 17, 2017 Stress test showing no ischemia, ejection fraction 85% Significant failure to  thrive/weakness  Episode of pyelonephritis May 2018 Back in the hospital June 9485 with diastolic CHF, shortness of breath Also with bronchitis requiring steroids [place], had significant weight gain and was not taking Lasix  Had back surgery  11/14/2015 Went to rehab Got postop infection, ecoli, sepsis crp 30, sed rate 130 Temp in October 2017 Drain placed 02/15/2016, rare ecoli on ABX, picc line in place Still with significant back pain No fevers,  Insulin has been increased.  unable to tolerate Vytorin as well as other statins  Admission on October 1, discharge on November 14 2010. She ruled out by cardiac enzymes, chest CT was negative for PE. She had a stress test that showed no ischemia. Her medications were adjusted. CT Scan did show fatty infiltration of the liver, granulomatous lesion of the left lower lobe   Prior CV studies:   The following studies were reviewed today:   cardiac catheterization 07/25/2011 for chest pain  This showed left dominant coronary system with moderate mid LAD, proximal diagonal #1 and proximal RCA disease all estimated at 50%, normal LV systolic function.   Past Medical History:  Diagnosis Date  . Anxiety   . Arthritis   . CHF (congestive heart failure) ([place])   . Chronic back pain    stenosis.degenerative disc,some scoliosis  . Constipation    takes Stool Softener daily  . Coronary artery disease   . Depression    takes Cymbalta daily  . Diabetes mellitus    Type 2 diabetic. Average fasting blood sugar runs high 170-200  . Diastolic CHF ([place]) 46/27/0350   Per patient, diagnosed in 2018  . Diverticulosis   . E coli infection   . GERD (gastroesophageal reflux disease)    takes Nexium daily  . Headache   . Hemorrhoids   . History of colon polyps    benign  . History of gout    doesn't take any meds  . History of hiatal hernia   . History of vertigo    doesn't take any meds  . Hyperlipidemia    takes Praluent daily  . Hypertension    currently BP medications are on hold   . Hypothyroidism    takes Synthroid daily  . Insomnia    takes Restoril nightly  . Muscle spasm    takes Robaxin as needed  . NSVT (nonsustained ventricular tachycardia) ([place])   . OSA on CPAP    . Peripheral vascular disease ([place])    AAA as stated per pt / was just discovered and pt states has not been referred to vascular MD   . Restless leg    takes Requip daily  . Rosacea   . Varicose veins    Past Surgical History:  Procedure Laterality Date  . ABDOMINAL HYSTERECTOMY     with BSo  . CARDIAC CATHETERIZATION  2013   Normal  . CARPAL TUNNEL RELEASE Bilateral   . CATARACT EXTRACTION, BILATERAL    . CHOLECYSTECTOMY    . COLONOSCOPY    . DIAGNOSTIC LAPAROSCOPY     multiple times  . DILATION AND CURETTAGE OF UTERUS    . ESOPHAGOGASTRODUODENOSCOPY (EGD) WITH PROPOFOL N/A 11/01/2014   Procedure: ESOPHAGOGASTRODUODENOSCOPY (EGD) WITH PROPOFOL;  Surgeon: Hulen Luster, MD;  Location: [facility] ENDOSCOPY;  Service: Gastroenterology;  Laterality: N/A;  . HARDWARE REMOVAL Left 10/11/2016   Procedure: HARDWARE REMOVAL-LEFT RADIUS;  Surgeon: Lovell Sheehan, MD;  Location: [facility] ORS;  Service: Orthopedics;  Laterality:  Left;  Left Radius Wrist   . IMPLANTABLE CONTACT LENS IMPLANTATION     bilateral  . LAMINECTOMY  11/13/2015  . LEFT HEART CATH AND CORONARY ANGIOGRAPHY Left 10/01/2017   Procedure: LEFT HEART CATH AND CORONARY ANGIOGRAPHY;  Surgeon: Nelva Bush, MD;  Location: [facility] CV LAB;  Service: Cardiovascular;  Laterality: Left;  . LOOP RECORDER INSERTION N/A 10/17/2017   Procedure: LOOP RECORDER INSERTION;  Surgeon: Deboraha Sprang, MD;  Location: [place] CV LAB;  Service: Cardiovascular;  Laterality: N/A;  . LUMBAR FUSION  11/2015  . LUMBAR WOUND DEBRIDEMENT N/A 12/02/2015   Procedure: WOUND Exploration;  Surgeon: Consuella Lose, MD;  Location: [place];  Service: Neurosurgery;  Laterality: N/A;  . OPEN REDUCTION INTERNAL FIXATION (ORIF) DISTAL RADIAL FRACTURE Left 08/29/2016   Procedure: OPEN REDUCTION INTERNAL FIXATION (ORIF) DISTAL RADIAL FRACTURE;  Surgeon: Lovell Sheehan, MD;  Location: [facility] ORS;  Service: Orthopedics;  Laterality: Left;  . PICC LINE PLACE PERIPHERAL  ([place] HX)     right upper arm   . ROTATOR CUFF REPAIR Left   . SAVORY DILATION N/A 11/01/2014   Procedure: SAVORY DILATION;  Surgeon: Hulen Luster, MD;  Location: [facility] ENDOSCOPY;  Service: Gastroenterology;  Laterality: N/A;  . TONSILLECTOMY    . TRIGGER FINGER RELEASE Bilateral      Current Meds  Medication Sig  . acetaminophen (TYLENOL) 650 MG CR tablet Take 650 mg by mouth 2 (two) times daily.  [name] albuterol (PROVENTIL HFA;VENTOLIN HFA) 108 (90 Base) MCG/ACT inhaler Inhale 2 puffs into the lungs every 6 (six) hours as needed for wheezing or shortness of breath.  . Alirocumab (PRALUENT) 150 MG/ML SOPN Inject 150 mg into the skin every 14 (fourteen) days.  [name] aspirin 81 MG tablet Take 81 mg by mouth at bedtime.   [name] atenolol (TENORMIN) 25 MG tablet Take 0.5 tablets (12.5 mg total) by mouth daily.  [name] azelastine (ASTELIN) 0.1 % nasal spray Place 2 sprays into both nostrils at bedtime as needed for rhinitis.   . Cholecalciferol (VITAMIN D) 2000 units tablet Take 2,000 Units by mouth daily.  Mariane Baumgarten Calcium (STOOL SOFTENER PO) Take by mouth.  . gabapentin (NEURONTIN) 300 MG capsule Take 300 mg by mouth 2 (two) times daily.  . insulin aspart (NOVOLOG) 100 UNIT/ML FlexPen Inject 10-16 Units into the skin See admin instructions. 10 units with breakfast, 10 units with lunch, 16 units at dinner, >200 increase by 2 units.  . insulin glargine (LANTUS) 100 UNIT/ML injection Inject 28 Units into the skin every morning.   [name] ipratropium-albuterol (DUONEB) 0.5-2.5 (3) MG/3ML SOLN Take 3 mLs by nebulization every 6 (six) hours as needed. (Patient taking differently: Take 3 mLs by nebulization every 6 (six) hours as needed (for shortness of breath/wheezing). )  . JARDIANCE 25 MG TABS tablet   . levothyroxine (SYNTHROID, LEVOTHROID) 75 MCG tablet Take 75 mcg by mouth daily before breakfast.   . nitroGLYCERIN (NITROSTAT) 0.4 MG SL tablet Place 1 tablet (0.4 mg total) under the tongue every 5 (five) minutes as  needed for chest pain.  [name] ondansetron (ZOFRAN) 4 MG tablet Take 4 mg by mouth every 8 (eight) hours as needed for nausea or vomiting.   . potassium chloride SA (K-DUR,KLOR-CON) 20 MEQ tablet Take 2 tablets (40 mEq total) by mouth daily.  . ranolazine (RANEXA) 1000 MG SR tablet Take 1 tablet (1,000 mg total) by mouth 2 (two) times daily.  [name] torsemide (DEMADEX) 20 MG tablet Take 40 mg by  mouth daily.      Allergies:   Reglan [metoclopramide]; Zetia [ezetimibe]; Ceftin [cefuroxime axetil]; Codeine; Penicillins; and Vicodin [hydrocodone-acetaminophen]   Social History   Tobacco Use  . Smoking status: Never Smoker  . Smokeless tobacco: Never Used  Substance Use Topics  . Alcohol use: No  . Drug use: No     Current Outpatient Medications on File Prior to Visit  Medication Sig Dispense Refill  . acetaminophen (TYLENOL) 650 MG CR tablet Take 650 mg by mouth 2 (two) times daily.    [name] albuterol (PROVENTIL HFA;VENTOLIN HFA) 108 (90 Base) MCG/ACT inhaler Inhale 2 puffs into the lungs every 6 (six) hours as needed for wheezing or shortness of breath. 3 Inhaler 3  . Alirocumab (PRALUENT) 150 MG/ML SOPN Inject 150 mg into the skin every 14 (fourteen) days.    [name] aspirin 81 MG tablet Take 81 mg by mouth at bedtime.     [name] atenolol (TENORMIN) 25 MG tablet Take 0.5 tablets (12.5 mg total) by mouth daily. 45 tablet 4  . azelastine (ASTELIN) 0.1 % nasal spray Place 2 sprays into both nostrils at bedtime as needed for rhinitis.     . Cholecalciferol (VITAMIN D) 2000 units tablet Take 2,000 Units by mouth daily.    Mariane Baumgarten Calcium (STOOL SOFTENER PO) Take by mouth.    . gabapentin (NEURONTIN) 300 MG capsule Take 300 mg by mouth 2 (two) times daily.    . insulin aspart (NOVOLOG) 100 UNIT/ML FlexPen Inject 10-16 Units into the skin See admin instructions. 10 units with breakfast, 10 units with lunch, 16 units at dinner, >200 increase by 2 units.    . insulin glargine (LANTUS) 100 UNIT/ML injection Inject 28  Units into the skin every morning.     [name] ipratropium-albuterol (DUONEB) 0.5-2.5 (3) MG/3ML SOLN Take 3 mLs by nebulization every 6 (six) hours as needed. (Patient taking differently: Take 3 mLs by nebulization every 6 (six) hours as needed (for shortness of breath/wheezing). )    . JARDIANCE 25 MG TABS tablet     . levothyroxine (SYNTHROID, LEVOTHROID) 75 MCG tablet Take 75 mcg by mouth daily before breakfast.     . nitroGLYCERIN (NITROSTAT) 0.4 MG SL tablet Place 1 tablet (0.4 mg total) under the tongue every 5 (five) minutes as needed for chest pain. 25 tablet 3  . ondansetron (ZOFRAN) 4 MG tablet Take 4 mg by mouth every 8 (eight) hours as needed for nausea or vomiting.     . potassium chloride SA (K-DUR,KLOR-CON) 20 MEQ tablet Take 2 tablets (40 mEq total) by mouth daily. 180 tablet 3  . ranolazine (RANEXA) 1000 MG SR tablet Take 1 tablet (1,000 mg total) by mouth 2 (two) times daily. 180 tablet 3  . torsemide (DEMADEX) 20 MG tablet Take 40 mg by mouth daily.     . traMADol (ULTRAM) 50 MG tablet Take 50 mg by mouth every 6 (six) hours as needed for moderate pain or severe pain.     Current Facility-Administered Medications on File Prior to Visit  Medication Dose Route Frequency Provider Last Rate Last Dose  . dexamethasone (DECADRON) injection 10 mg  10 mg Intravenous Once Lovell Sheehan, MD         Family Hx: The patient's family history includes Breast cancer (age of onset: 52) in her mother; Heart attack in her sister; Heart disease in her brother, father, mother, and sister.  ROS:   Please see the history of present illness.  Review of Systems  Constitutional: Negative.   Respiratory: Negative.   Cardiovascular: Positive for chest pain and palpitations.       Arm pain  Gastrointestinal: Negative.   Musculoskeletal: Negative.   Neurological: Negative.   Psychiatric/Behavioral: Negative.   All other systems reviewed and are negative.    Labs/Other Tests and Data Reviewed:     Recent Labs: 07/17/2017: B Natriuretic Peptide 29.0 02/22/2018: ALT 16 05/16/2018: BUN 21; Creatinine, Ser 1.30; Hemoglobin 12.6; Platelets 281; Potassium 3.2; Sodium 141   Recent Lipid Panel Lab Results  Component Value Date/Time   CHOL 163 07/26/2015 08:02 AM   TRIG 187 (H) 07/26/2015 08:02 AM   HDL 32 (L) 07/26/2015 08:02 AM   CHOLHDL 5.1 (H) 07/26/2015 08:02 AM   CHOLHDL 3.8 Ratio 06/10/2008 10:32 PM   LDLCALC 94 07/26/2015 08:02 AM    Wt Readings from Last 3 Encounters:  06/18/18 207 lb (93.9 kg)  06/16/18 205 lb (93 kg)  05/16/18 205 lb (93 kg)     Exam:    Vital Signs: Vital signs may also be detailed in the HPI BP 121/75   Pulse 77   Temp 97.8 F (36.6 C)   Ht 5\' 1"  (1.549 m)   Wt 207 lb (93.9 kg)   BMI 39.11 kg/m   Wt Readings from Last 3 Encounters:  06/18/18 207 lb (93.9 kg)  06/16/18 205 lb (93 kg)  05/16/18 205 lb (93 kg)   Temp Readings from Last 3 Encounters:  06/18/18 97.8 F (36.6 C)  06/16/18 98.3 F (36.8 C) (Oral)  05/16/18 97.7 F (36.5 C) (Oral)   BP Readings from Last 3 Encounters:  06/18/18 121/75  06/16/18 129/76  05/16/18 130/66   Pulse Readings from Last 3 Encounters:  06/18/18 77  06/16/18 91  05/16/18 73    120 to 130/77 Pulse 77 resp 16  Well nourished, well developed female in no acute distress. Constitutional:  oriented to person, place, and time. No distress.  Head: Normocephalic and atraumatic.  Eyes:  no discharge. No scleral icterus.  Neck: Normal range of motion. Neck supple.  Pulmonary/Chest: No audible wheezing, no distress, appears comfortable Musculoskeletal: Normal range of motion.  no  tenderness or deformity.  Neurological:   Coordination normal. Full exam not performed Skin:  No rash Psychiatric:  normal mood and affect. behavior is normal. Thought content normal.    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 Sabra Heck, consider taking etodolac as needed which seems to work for her  Type 2 diabetes mellitus with stage 3 chronic kidney disease, with long-term current use of insulin ([place]) We have encouraged continued exercise, careful diet management in an effort to lose weight.  Atrial tachycardia ([place]) Followed by Dr. Caryl Comes Reports symptoms relatively well controlled  Chronic diastolic heart failure ([place]) Appears euvolemic, will continue torsemide 20 twice daily with potassium 20 twice daily Recent potassium 3.2 as she was only taking potassium once a day She will increase the dosing  Sinus node dysfunction (HCC) Followed by Dr. Caryl Comes  Lymphedema Recommended leg wraps, leg elevation, weight loss  Essential hypertension Blood pressure is well controlled on today's visit. No changes made  to the medications.  OSA on CPAP Reports she is compliant with her CPAP  Syncope In the setting of nausea vomiting, prior episodes in the setting of abdominal pain Likely vasovagal in nature She does not really want GI work-up at this time Having pacemaker placed less appealing given prior history of systemic infections   COVID-19 Education: The signs and symptoms of COVID-19 were discussed with the patient and how to seek care for testing (follow up with PCP or arrange E-visit).  The importance of social distancing was discussed today.  Patient Risk:   After full review of this patients clinical status, I feel that they are at least moderate risk at this time.  Time:   Today, I have spent 45 minutes with the patient with telehealth technology discussing the cardiac and medical problems/diagnoses detailed above   10 min spent  reviewing the chart prior to patient visit today   Medication Adjustments/Labs and Tests Ordered: Current medicines are reviewed at length with the patient today.  Concerns regarding medicines are outlined above.   Tests Ordered: No tests ordered   Medication Changes: No changes made   Disposition: Follow-up in 6 months   Signed, Ida Rogue, MD  06/18/2018 11:44 AM    [place] Office [address], [place], [place] [address]

## 2018-06-18 NOTE — Patient Instructions (Addendum)
Medication Instructions:  Please stay on Potassium 20 mEq Twice a day Refill sent in for nitroglycerin and information sheet below on this.   If you need a refill on your cardiac medications before your next appointment, please call your pharmacy.    Lab work: No new labs needed  If you have labs (blood work) drawn today and your tests are completely normal, you will receive your results only by: [name] MyChart Message (if you have MyChart) OR . A paper copy in the mail If you have any lab test that is abnormal or we need to change your treatment, we will call you to review the results.   Testing/Procedures: No new testing needed   Follow-Up: At [facility], you and your health needs are our priority.  As part of our continuing mission to provide you with exceptional heart care, we have created designated Provider Care Teams.  These Care Teams include your primary Cardiologist (physician) and Advanced Practice Providers (APPs -  Physician Assistants and Nurse Practitioners) who all work together to provide you with the care you need, when you need it.  . You will need a follow up appointment in 6 months .   Please call our office 2 months in advance to schedule this appointment.    . Providers on your designated Care Team:   . Murray Hodgkins, NP . Christell Faith, PA-C . Marrianne Mood, PA-C  Any Other Special Instructions Will Be Listed Below (If Applicable).  For educational health videos Log in to : [email or web address] Or : [email or web address], password : [email or web address]  Nitroglycerin sublingual tablets What is this medicine? NITROGLYCERIN (nye troe GLI ser in) is a type of vasodilator. It relaxes blood vessels, increasing the blood and oxygen supply to your heart. This medicine is used to relieve chest pain caused by angina. It is also used to prevent chest pain before activities like climbing stairs, going outdoors in cold weather, or sexual activity. This medicine may be used for other  purposes; ask your health care provider or pharmacist if you have questions. COMMON BRAND NAME(S): Nitroquick, Nitrostat, Nitrotab What should I tell my health care provider before I take this medicine? They need to know if you have any of these conditions: -anemia -head injury, recent stroke, or bleeding in the brain -liver disease -previous heart attack -an unusual or allergic reaction to nitroglycerin, other medicines, foods, dyes, or preservatives -pregnant or trying to get pregnant -breast-feeding How should I use this medicine? Take this medicine by mouth as needed. At the first sign of an angina attack (chest pain or tightness) place one tablet under your tongue. You can also take this medicine 5 to 10 minutes before an event likely to produce chest pain. Follow the directions on the prescription label. Let the tablet dissolve under the tongue. Do not swallow whole. Replace the dose if you accidentally swallow it. It will help if your mouth is not dry. Saliva around the tablet will help it to dissolve more quickly. Do not eat or drink, smoke or chew tobacco while a tablet is dissolving. If you are not better within 5 minutes after taking ONE dose of nitroglycerin, call 9-1-1 immediately to seek emergency medical care. Do not take more than 3 nitroglycerin tablets over 15 minutes. If you take this medicine often to relieve symptoms of angina, your doctor or health care professional may provide you with different instructions to manage your symptoms. If symptoms do not go away after following these instructions, it is important  to call 9-1-1 immediately. Do not take more than 3 nitroglycerin tablets over 15 minutes. Talk to your pediatrician regarding the use of this medicine in children. Special care may be needed. Overdosage: If you think you have taken too much of this medicine contact a poison control center or emergency room at once. NOTE: This medicine is only for you. Do not share this  medicine with others. What if I miss a dose? This does not apply. This medicine is only used as needed. What may interact with this medicine? Do not take this medicine with any of the following medications: -certain migraine medicines like ergotamine and dihydroergotamine (DHE) -medicines used to treat erectile dysfunction like sildenafil, tadalafil, and vardenafil -riociguat This medicine may also interact with the following medications: -alteplase -aspirin -heparin -medicines for high blood pressure -medicines for mental depression -other medicines used to treat angina -phenothiazines like chlorpromazine, mesoridazine, prochlorperazine, thioridazine This list may not describe all possible interactions. Give your health care provider a list of all the medicines, herbs, non-prescription drugs, or dietary supplements you use. Also tell them if you smoke, drink alcohol, or use illegal drugs. Some items may interact with your medicine. What should I watch for while using this medicine? Tell your doctor or health care professional if you feel your medicine is no longer working. Keep this medicine with you at all times. Sit or lie down when you take your medicine to prevent falling if you feel dizzy or faint after using it. Try to remain calm. This will help you to feel better faster. If you feel dizzy, take several deep breaths and lie down with your feet propped up, or bend forward with your head resting between your knees. You may get drowsy or dizzy. Do not drive, use machinery, or do anything that needs mental alertness until you know how this drug affects you. Do not stand or sit up quickly, especially if you are an older patient. This reduces the risk of dizzy or fainting spells. Alcohol can make you more drowsy and dizzy. Avoid alcoholic drinks. Do not treat yourself for coughs, colds, or pain while you are taking this medicine without asking your doctor or health care professional for  advice. Some ingredients may increase your blood pressure. What side effects may I notice from receiving this medicine? Side effects that you should report to your doctor or health care professional as soon as possible: -blurred vision -dry mouth -skin rash -sweating -the feeling of extreme pressure in the head -unusually weak or tired Side effects that usually do not require medical attention (report to your doctor or health care professional if they continue or are bothersome): -flushing of the face or neck -headache -irregular heartbeat, palpitations -nausea, vomiting This list may not describe all possible side effects. Call your doctor for medical advice about side effects. You may report side effects to FDA at 1-800-FDA-1088. Where should I keep my medicine? Keep out of the reach of children. Store at room temperature between 20 and 25 degrees C (68 and 77 degrees F). Store in [facility] [employer]. Protect from light and moisture. Keep tightly closed. Throw away any unused medicine after the expiration date. NOTE: This sheet is a summary. It may not cover all possible information. If you have questions about this medicine, talk to your doctor, pharmacist, or health care provider.  2019 Elsevier/Gold Standard (2012-11-27 17:57:36)

## 2018-07-02 ENCOUNTER — Other Ambulatory Visit: Payer: Self-pay | Admitting: Cardiovascular Disease

## 2018-07-08 ENCOUNTER — Ambulatory Visit (INDEPENDENT_AMBULATORY_CARE_PROVIDER_SITE_OTHER): Payer: Medicare Other | Admitting: *Deleted

## 2018-07-08 DIAGNOSIS — R55 Syncope and collapse: Secondary | ICD-10-CM

## 2018-07-08 DIAGNOSIS — I495 Sick sinus syndrome: Secondary | ICD-10-CM | POA: Diagnosis not present

## 2018-07-09 LAB — CUP PACEART REMOTE DEVICE CHECK
Date Time Interrogation Session: 20200526163926
Implantable Pulse Generator Implant Date: 20190905

## 2018-07-14 ENCOUNTER — Ambulatory Visit: Payer: Medicare Other | Admitting: Family

## 2018-07-14 ENCOUNTER — Telehealth: Payer: Medicare Other | Admitting: Family

## 2018-07-21 NOTE — Progress Notes (Signed)
Carelink Summary Report / Loop Recorder 

## 2018-07-28 ENCOUNTER — Encounter: Payer: Self-pay | Admitting: Family

## 2018-07-28 ENCOUNTER — Encounter: Payer: Self-pay | Admitting: *Deleted

## 2018-07-28 ENCOUNTER — Other Ambulatory Visit: Payer: Self-pay

## 2018-07-28 ENCOUNTER — Telehealth: Payer: Self-pay

## 2018-07-28 ENCOUNTER — Ambulatory Visit: Payer: Medicare Other | Attending: Family | Admitting: Family

## 2018-07-28 VITALS — BP 130/81 | HR 85 | Wt 204.0 lb

## 2018-07-28 DIAGNOSIS — I10 Essential (primary) hypertension: Secondary | ICD-10-CM

## 2018-07-28 DIAGNOSIS — I5032 Chronic diastolic (congestive) heart failure: Secondary | ICD-10-CM

## 2018-07-28 DIAGNOSIS — Z794 Long term (current) use of insulin: Secondary | ICD-10-CM

## 2018-07-28 DIAGNOSIS — I890 Lymphedema, not elsewhere classified: Secondary | ICD-10-CM

## 2018-07-28 DIAGNOSIS — E1159 Type 2 diabetes mellitus with other circulatory complications: Secondary | ICD-10-CM

## 2018-07-28 NOTE — Telephone Encounter (Signed)
TELEPHONE CALL NOTE  [name] has been deemed a candidate for a follow-up tele-health visit to limit community exposure during the Covid-19 pandemic. I spoke with the patient via phone to ensure availability of phone/video source, confirm preferred email & phone number, discuss instructions and expectations, and review consent.   I reminded [name] to be prepared with any vital sign and/or heart rhythm information that could potentially be obtained via home monitoring, at the time of her visit.  Finally, I reminded [name] to expect an e-mail containing a link for their video-based visit approximately 15 minutes before her visit, or alternatively, a phone call at the time of her visit if her visit is planned to be a phone encounter.  Did the patient verbally consent to treatment as below? YES  [name], CMA 07/28/2018 10:55 AM  CONSENT FOR TELE-HEALTH VISIT - PLEASE REVIEW  I hereby voluntarily request, consent and authorize [facility] and its employed or contracted physicians, physician assistants, nurse practitioners or other licensed health care professionals (the Practitioner), to provide me with telemedicine health care services (the "Services") as deemed necessary by the treating Practitioner. I acknowledge and consent to receive the Services by the Practitioner via telemedicine. I understand that the telemedicine visit will involve communicating with the Practitioner through telephonic communication technology and the disclosure of certain medical information by electronic transmission. I acknowledge that I have been given the opportunity to request an in-person assessment or other available alternative prior to the telemedicine visit and am voluntarily participating in the telemedicine visit.  I understand that I have the right to withhold or withdraw my consent to the use of telemedicine in the course of my care at any time, without affecting my  right to future care or treatment, and that the Practitioner or I may terminate the telemedicine visit at any time. I understand that I have the right to inspect all information obtained and/or recorded in the course of the telemedicine visit and may receive copies of available information for a reasonable fee.  I understand that some of the potential risks of receiving the Services via telemedicine include:  [name] Delay or interruption in medical evaluation due to technological equipment failure or disruption; . Information transmitted may not be sufficient (e.g. poor resolution of images) to allow for appropriate medical decision making by the Practitioner; and/or  . In rare instances, security protocols could fail, causing a breach of personal health information.  Furthermore, I acknowledge that it is my responsibility to provide information about my medical history, conditions and care that is complete and accurate to the best of my ability. I acknowledge that Practitioner's advice, recommendations, and/or decision may be based on factors not within their control, such as incomplete or inaccurate data provided by me or lack of visual representation. I understand that the practice of medicine is not an exact science and that Practitioner makes no warranties or guarantees regarding treatment outcomes. I acknowledge that I will receive a copy of this consent concurrently upon execution via email to the email address I last provided but may also request a printed copy by calling the office of [facility].    I understand that my insurance may be billed for this visit.   I have read or had this consent read to me. . I understand the contents of this consent, which adequately explains the benefits and risks of the Services being provided via telemedicine.  [name]  I have been provided ample opportunity to ask questions regarding this consent and the Services and have had my questions answered to my  satisfaction. . I give my informed consent for the services to be provided through the use of telemedicine in my medical care  By participating in this telemedicine visit I agree to the above.

## 2018-07-28 NOTE — Progress Notes (Signed)
Virtual Visit via Telephone Note    Evaluation Performed:  Follow-up visit  This visit type was conducted due to national recommendations for restrictions regarding the COVID-19 Pandemic (e.g. social distancing).  This format is felt to be most appropriate for this patient at this time.  All issues noted in this document were discussed and addressed.  No physical exam was performed (except for noted visual exam findings with Video Visits).  Please refer to the patient's chart (MyChart message for video visits and phone note for telephone visits) for the patient's consent to telehealth for [facility]  Date:  07/28/2018   ID:  [name], DOB [birth year], MRN [id number]  Patient Location:  [place] [address]   Provider location:   [facility] [place] [address], [place] [address]  PCP:  Rusty Aus, MD  Cardiologist:  Ida Rogue, MD Electrophysiologist:  None   Chief Complaint:  Shortness of breath  History of Present Illness:    [name] is a 73 y.o. female who presents via audio/video conferencing for a telehealth visit today.  Patient verified DOB and address.  The patient does not have symptoms concerning for COVID-19 infection (fever, chills, cough, or new SHORTNESS OF BREATH).   Patient reports minimal shortness of breath upon moderate exertion. She describes this as chronic in nature having been present for several years with varying levels of severity. She has associated intermittent dizziness, stable angina and fatigue along with this. She denies any swelling in her legs/ abdomen, difficulty sleeping or weight gain. Says that her weight generally ranges from 204-207 pounds. Now has a loop recorder and she's had some syncopal events after having nausea/ vomiting.   Prior CV studies:   The following studies were reviewed today:  Echo report from 07/17/17 reviewed and showed an EF of 60-65%.    Past Medical History:  Diagnosis Date  . Anxiety   . Arthritis   . CHF (congestive heart failure) ([place])   . Chronic back pain    stenosis.degenerative disc,some scoliosis  . Constipation    takes Stool Softener daily  . Coronary artery disease   . Depression    takes Cymbalta daily  . Diabetes mellitus    Type 2 diabetic. Average fasting blood sugar runs high 170-200  . Diastolic CHF ([place]) 61/44/3154   Per patient, diagnosed in 2018  . Diverticulosis   . E coli infection   . GERD (gastroesophageal reflux disease)    takes Nexium daily  . Headache   . Hemorrhoids   . History of colon polyps    benign  . History of gout    doesn't take any meds  . History of hiatal hernia   . History of vertigo    doesn't take any meds  . Hyperlipidemia    takes Praluent daily  . Hypertension    currently BP medications are on hold   . Hypothyroidism    takes Synthroid daily  . Insomnia    takes Restoril nightly  . Muscle spasm    takes Robaxin as needed  . NSVT (nonsustained ventricular tachycardia) ([place])   . OSA on CPAP   . Peripheral vascular disease ([place])    AAA as stated per pt / was just discovered and pt states has not been referred to vascular MD   . Restless leg    takes Requip daily  . Rosacea   . Varicose veins  Past Surgical History:  Procedure Laterality Date  . ABDOMINAL HYSTERECTOMY     with BSo  . CARDIAC CATHETERIZATION  2013   Normal  . CARPAL TUNNEL RELEASE Bilateral   . CATARACT EXTRACTION, BILATERAL    . CHOLECYSTECTOMY    . COLONOSCOPY    . DIAGNOSTIC LAPAROSCOPY     multiple times  . DILATION AND CURETTAGE OF UTERUS    . ESOPHAGOGASTRODUODENOSCOPY (EGD) WITH PROPOFOL N/A 11/01/2014   Procedure: ESOPHAGOGASTRODUODENOSCOPY (EGD) WITH PROPOFOL;  Surgeon: Hulen Luster, MD;  Location: [facility] ENDOSCOPY;  Service: Gastroenterology;  Laterality: N/A;  . HARDWARE REMOVAL Left 10/11/2016   Procedure: HARDWARE REMOVAL-LEFT RADIUS;  Surgeon: Lovell Sheehan, MD;   Location: [facility] ORS;  Service: Orthopedics;  Laterality: Left;  Left Radius Wrist   . IMPLANTABLE CONTACT LENS IMPLANTATION     bilateral  . LAMINECTOMY  11/13/2015  . LEFT HEART CATH AND CORONARY ANGIOGRAPHY Left 10/01/2017   Procedure: LEFT HEART CATH AND CORONARY ANGIOGRAPHY;  Surgeon: Nelva Bush, MD;  Location: [place] CV LAB;  Service: Cardiovascular;  Laterality: Left;  . LOOP RECORDER INSERTION N/A 10/17/2017   Procedure: LOOP RECORDER INSERTION;  Surgeon: Deboraha Sprang, MD;  Location: [place] CV LAB;  Service: Cardiovascular;  Laterality: N/A;  . LUMBAR FUSION  11/2015  . LUMBAR WOUND DEBRIDEMENT N/A 12/02/2015   Procedure: WOUND Exploration;  Surgeon: Consuella Lose, MD;  Location: [place];  Service: Neurosurgery;  Laterality: N/A;  . OPEN REDUCTION INTERNAL FIXATION (ORIF) DISTAL RADIAL FRACTURE Left 08/29/2016   Procedure: OPEN REDUCTION INTERNAL FIXATION (ORIF) DISTAL RADIAL FRACTURE;  Surgeon: Lovell Sheehan, MD;  Location: [facility] ORS;  Service: Orthopedics;  Laterality: Left;  . PICC LINE PLACE PERIPHERAL ([place] HX)     right upper arm   . ROTATOR CUFF REPAIR Left   . SAVORY DILATION N/A 11/01/2014   Procedure: SAVORY DILATION;  Surgeon: Hulen Luster, MD;  Location: [facility] ENDOSCOPY;  Service: Gastroenterology;  Laterality: N/A;  . TONSILLECTOMY    . TRIGGER FINGER RELEASE Bilateral      Current Meds  Medication Sig  . albuterol (PROVENTIL HFA;VENTOLIN HFA) 108 (90 Base) MCG/ACT inhaler Inhale 2 puffs into the lungs every 6 (six) hours as needed for wheezing or shortness of breath.  . Alirocumab (PRALUENT) 150 MG/ML SOPN Inject 150 mg into the skin every 14 (fourteen) days.  [name] aspirin 81 MG tablet Take 81 mg by mouth at bedtime.   [name] atenolol (TENORMIN) 25 MG tablet Take 0.5 tablets (12.5 mg total) by mouth daily.  [name] azelastine (ASTELIN) 0.1 % nasal spray Place 2 sprays into both nostrils at bedtime as needed for rhinitis.   . Cholecalciferol (VITAMIN D) 2000 units  tablet Take 2,000 Units by mouth daily.  [name] esomeprazole (NEXIUM) 20 MG capsule Take 20 mg by mouth daily.  [name] gabapentin (NEURONTIN) 300 MG capsule Take 300 mg by mouth 2 (two) times daily.  . insulin aspart (NOVOLOG) 100 UNIT/ML FlexPen Inject 10-16 Units into the skin See admin instructions. 10 units with breakfast, 10 units with lunch, 16 units at dinner, >200 increase by 2 units.  . insulin glargine (LANTUS) 100 UNIT/ML injection Inject 30 Units into the skin every morning.   [name] ipratropium-albuterol (DUONEB) 0.5-2.5 (3) MG/3ML SOLN Take 3 mLs by nebulization every 6 (six) hours as needed. (Patient taking differently: Take 3 mLs by nebulization every 6 (six) hours as needed (for shortness of breath/wheezing). )  . JARDIANCE 25 MG TABS tablet 25 mg daily.   [name]  levothyroxine (SYNTHROID, LEVOTHROID) 75 MCG tablet Take 75 mcg by mouth daily before breakfast.   . nitroGLYCERIN (NITROSTAT) 0.4 MG SL tablet Place 1 tablet (0.4 mg total) under the tongue every 5 (five) minutes as needed for chest pain.  [name] ondansetron (ZOFRAN) 4 MG tablet Take 4 mg by mouth every 8 (eight) hours as needed for nausea or vomiting.   . potassium chloride SA (K-DUR,KLOR-CON) 20 MEQ tablet Take 2 tablets (40 mEq total) by mouth daily.  . ranolazine (RANEXA) 1000 MG SR tablet TAKE 1 TABLET TWICE A DAY  . rOPINIRole (REQUIP) 2 MG tablet Take 2 mg by mouth at bedtime.  . temazepam (RESTORIL) 30 MG capsule Take 30 mg by mouth at bedtime as needed for sleep.  [name] torsemide (DEMADEX) 20 MG tablet Take 40 mg by mouth daily.   . traMADol (ULTRAM) 50 MG tablet Take 50 mg by mouth daily as needed for moderate pain or severe pain.      Allergies:   Reglan [metoclopramide], Zetia [ezetimibe], Ceftin [cefuroxime axetil], Codeine, Penicillins, and Vicodin [hydrocodone-acetaminophen]   Social History   Tobacco Use  . Smoking status: Never Smoker  . Smokeless tobacco: Never Used  Substance Use Topics  . Alcohol use: No  . Drug use: No      Family Hx: The patient's family history includes Breast cancer (age of onset: 39) in her mother; Heart attack in her sister; Heart disease in her brother, father, mother, and sister.  ROS:   Please see the history of present illness.     All other systems reviewed and are negative.   Labs/Other Tests and Data Reviewed:    Recent Labs: 02/22/2018: ALT 16 05/16/2018: BUN 21; Creatinine, Ser 1.30; Hemoglobin 12.6; Platelets 281; Potassium 3.2; Sodium 141   Recent Lipid Panel Lab Results  Component Value Date/Time   CHOL 163 07/26/2015 08:02 AM   TRIG 187 (H) 07/26/2015 08:02 AM   HDL 32 (L) 07/26/2015 08:02 AM   CHOLHDL 5.1 (H) 07/26/2015 08:02 AM   CHOLHDL 3.8 Ratio 06/10/2008 10:32 PM   LDLCALC 94 07/26/2015 08:02 AM    Wt Readings from Last 3 Encounters:  07/28/18 204 lb (92.5 kg)  06/18/18 207 lb (93.9 kg)  06/16/18 205 lb (93 kg)     Exam:    Vital Signs:  BP 130/81 Comment: self-reported  Pulse 85 Comment: self-reported  Wt 204 lb (92.5 kg) Comment: self-reported  BMI 38.55 kg/m    Well nourished, well developed female in no  acute distress.   ASSESSMENT & PLAN:    1. Chronic heart failure with preserved ejection fraction- - NYHA class II - euvolemic today based on patient description of symptoms - weighing daily; Reminded to call for an overnight weight gain of >2 pounds or a weekly weight gain of >5 pounds - says that her weight ranges from 204-207 pounds - says that she occasionally misses her diuretic such as when she's traveling and she notices a weight gain when that occurs; weight comes off once she resumes it - says that she's not adding any salt to her food and is trying to follow a low sodium diet - had telemedicine visit with cardiologist Rockey Situ) 06/18/2018 - had telemedicine visit with EP Caryl Comes) 06/16/2018 - maintain fluid intake to 40-60 ounces daily - saw pulmonologist Raul Del) 11/29/17  2: HTN- - self-reported BP looks good today - saw  PCP Sabra Heck) 04/24/2018 - BMP on 05/16/2018 reviewed and showed sodium 141, potassium 3.2, creatinine 1.3 and GFR  41   3: Lymphedema- - resolved  4: Diabetes- - fasting glucose at home this morning was "good" - A1c on 07/27/17 was 5.8% - saw endocrinologist 03/19/2018  COVID-19 Education: The signs and symptoms of COVID-19 were discussed with the patient and how to seek care for testing (follow up with PCP or arrange E-visit).  The importance of social distancing was discussed today.  Patient Risk:   After full review of this patients clinical status, I feel that they are at least moderate risk at this time.  Time:   Today, I have spent 20 minutes with the patient with telehealth technology discussing medications, diet and symptoms to report.     Medication Adjustments/Labs and Tests Ordered: Current medicines are reviewed at length with the patient today.  Concerns regarding medicines are outlined above.   Tests Ordered: No orders of the defined types were placed in this encounter.  Medication Changes: No orders of the defined types were placed in this encounter.   Disposition:  Follow-up in 6 months or sooner for any questions/problems before then.   Signed, Alisa Graff, FNP  07/28/2018 11:31 AM    [facility]

## 2018-07-28 NOTE — Patient Instructions (Signed)
Continue weighing daily and call for an overnight weight gain of > 2 pounds or a weekly weight gain of >5 pounds. 

## 2018-08-11 ENCOUNTER — Ambulatory Visit (INDEPENDENT_AMBULATORY_CARE_PROVIDER_SITE_OTHER): Payer: Medicare Other | Admitting: *Deleted

## 2018-08-11 DIAGNOSIS — I5032 Chronic diastolic (congestive) heart failure: Secondary | ICD-10-CM | POA: Diagnosis not present

## 2018-08-11 LAB — CUP PACEART REMOTE DEVICE CHECK
Date Time Interrogation Session: 20200628181057
Implantable Pulse Generator Implant Date: 20190905

## 2018-08-13 ENCOUNTER — Telehealth: Payer: Self-pay | Admitting: Internal Medicine

## 2018-08-13 NOTE — Telephone Encounter (Signed)
Spoke with patient. Pt reports she feels okay right now. She agrees to send a manual transmission for review.

## 2018-08-13 NOTE — Telephone Encounter (Signed)
E  Ur amazing  I have asked her to f/u with her PCP re GI eval for cause of nausea  Mark  Can u pick this ball up and run with it   thx   The lack of any bradycardia on these tracings make CLS less likely ( I expect)  thx  sk

## 2018-08-13 NOTE — Telephone Encounter (Signed)
°  1. Has your device fired? NO  2. Is you device beeping? NO  3. Are you experiencing draining or swelling at device site? NO  4. Are you calling to see if we received your device transmission? NO  5. Have you passed out? Yes, this morning about 4:00-4:30 patient passed out twice. Passed in bathroom, was on her knees so she would not fall.   Woke up with shortness of breath, has had nausea this morning and threw up.Patient states that in all past experiences these episodes have started with nausea first and last not it was shortness of breath, patient wondering if this is significant or not. Once patient got out of bed she was dizzy, light headed and wobbly after taking C-pap. Patient did not hit the record button.     Please route to [place]

## 2018-08-13 NOTE — Telephone Encounter (Signed)
Transmission received. 2 patient-marked symptom episodes show SR-ST 93-110bpm (times off in device due to daylight savings), see partial ECGs below. Pt reports that prior to the episode, she woke up ShOB with CPAP on, sat up, changed mask, still felt ShOB. Pt then got up and felt lightheaded, dizzy, wobbly. Walked to the bathroom, became nauseous, sat on her knees, heaved, felt like she passed out. Felt better a few minutes later, walked to the living room, checked BP--129/73, CBG 124. Soon after, pt started noticing more nausea, then had another episode of syncope on her knees. Symptoms improved after that.  Denies any symptoms at this time. Reports she feels well hydrated. Denies any injury with syncopal episodes as she was sitting on her knees at the time. Checked BP and CBG later this morning and reports they were normal. Advised I will send this information to Dr. Caryl Comes for review. ED precautions given for any further episodes. Pt verbalizes understanding and denies questions at this time.

## 2018-08-14 ENCOUNTER — Other Ambulatory Visit: Payer: Self-pay

## 2018-08-14 ENCOUNTER — Ambulatory Visit (INDEPENDENT_AMBULATORY_CARE_PROVIDER_SITE_OTHER): Payer: Medicare Other | Admitting: Surgery

## 2018-08-14 ENCOUNTER — Encounter: Payer: Self-pay | Admitting: Surgery

## 2018-08-14 VITALS — BP 155/83 | HR 86 | Temp 97.9°F | Resp 16 | Ht 61.0 in | Wt 210.8 lb

## 2018-08-14 DIAGNOSIS — K5732 Diverticulitis of large intestine without perforation or abscess without bleeding: Secondary | ICD-10-CM

## 2018-08-14 DIAGNOSIS — I25118 Atherosclerotic heart disease of native coronary artery with other forms of angina pectoris: Secondary | ICD-10-CM | POA: Diagnosis not present

## 2018-08-14 NOTE — Patient Instructions (Addendum)
Try to avoid constipation. Drink plenty of water daily. Please call the office if you have any questions or concerns.     Docusate capsules What is this medicine? DOCUSATE (doc CUE sayt) is stool softener. It helps prevent constipation and straining or discomfort associated with hard or dry stools. This medicine may be used for other purposes; ask your health care provider or pharmacist if you have questions. COMMON BRAND NAME(S): Colace, Colace Clear, Correctol, D.O.S., DC, Doc-Q-Lace, DocuLace, Docusoft S, DOK, DOK Extra Strength, Dulcolax, Genasoft, Kao-Tin, Kaopectate Liqui-Gels, Phillips Stool Softener, Stool Softener, Stool Softner DC, Sulfolax, Sur-Q-Lax, Surfak, Uni-Ease What should I tell my health care provider before I take this medicine? They need to know if you have any of these conditions:  nausea or vomiting  severe constipation  stomach pain  sudden change in bowel habit lasting more than 2 weeks  an unusual or allergic reaction to docusate, other medicines, foods, dyes, or preservatives  pregnant or trying to get pregnant  breast-feeding How should I use this medicine? Take this medicine by mouth with a glass of water. Follow the directions on the label. Take your doses at regular intervals. Do not take your medicine more often than directed. Talk to your pediatrician regarding the use of this medicine in children. While this medicine may be prescribed for children as young as 2 years for selected conditions, precautions do apply. Overdosage: If you think you have taken too much of this medicine contact a poison control center or emergency room at once. NOTE: This medicine is only for you. Do not share this medicine with others. What if I miss a dose? If you miss a dose, take it as soon as you can. If it is almost time for your next dose, take only that dose. Do not take double or extra doses. What may interact with this medicine?  mineral oil This list may not  describe all possible interactions. Give your health care provider a list of all the medicines, herbs, non-prescription drugs, or dietary supplements you use. Also tell them if you smoke, drink alcohol, or use illegal drugs. Some items may interact with your medicine. What should I watch for while using this medicine? Do not use for more than one week without advice from your doctor or health care professional. If your constipation returns, check with your doctor or health care professional. Drink plenty of water while taking this medicine. Drinking water helps decrease constipation. Stop using this medicine and contact your doctor or health care professional if you experience any rectal bleeding or do not have a bowel movement after use. These could be signs of a more serious condition. What side effects may I notice from receiving this medicine? Side effects that you should report to your doctor or health care professional as soon as possible:  allergic reactions like skin rash, itching or hives, swelling of the face, lips, or tongue Side effects that usually do not require medical attention (report to your doctor or health care professional if they continue or are bothersome):  diarrhea  stomach cramps  throat irritation This list may not describe all possible side effects. Call your doctor for medical advice about side effects. You may report side effects to FDA at 1-800-FDA-1088. Where should I keep my medicine? Keep out of the reach of children. Store at room temperature between 15 and 30 degrees C (59 and 86 degrees F). Throw away any unused medicine after the expiration date. NOTE: This sheet is a  summary. It may not cover all possible information. If you have questions about this medicine, talk to your doctor, pharmacist, or health care provider.  2020 Elsevier/Gold Standard (2007-05-22 15:56:49) Fiber Content in Foods  See the following list for the dietary fiber content of some  common foods. High-fiber foods High-fiber foods contain 4 grams or more (4g or more) of fiber per serving. They include:  Artichoke (fresh) - 1 medium has 10.3g of fiber.  Baked beans, plain or vegetarian (canned) -  cup has 5.2g of fiber.  Blackberries or raspberries (fresh) -  cup has 4g of fiber.  Bran cereal -  cup has 8.6g of fiber.  Bulgur (cooked) -  cup has 4g of fiber.  Kidney beans (canned) -  cup has 6.8g of fiber.  Lentils (cooked) -  cup has 7.8g of fiber.  Pear (fresh) - 1 medium has 5.1g of fiber.  Peas (frozen) -  cup has 4.4g of fiber.  Pinto beans (canned) -  cup has 5.5g of fiber.  Pinto beans (dried and cooked) -  cup has 7.7g of fiber.  Potato with skin (baked) - 1 medium has 4.4g of fiber.  Quinoa (cooked) -  cup has 5g of fiber.  Soybeans (canned, frozen, or fresh) -  cup has 5.1g of fiber. Moderate-fiber foods Moderate-fiber foods contain 1-4 grams (1-4g) of fiber per serving. They include:  Almonds - 1 oz. has 3.5g of fiber.  Apple with skin - 1 medium has 3.3g of fiber.  Applesauce, sweetened -  cup has 1.5g of fiber.  Bagel, plain - one 4-inch (10-cm) bagel has 2g of fiber.  Banana - 1 medium has 3.1g of fiber.  Broccoli (cooked) -  cup has 2.5g of fiber.  Carrots (cooked) -  cup has 2.3g of fiber.  Corn (canned or frozen) -  cup has 2.1g of fiber.  Corn tortilla - one 6-inch (15-cm) tortilla has 1.5g of fiber.  Green beans (canned) -  cup has 2g of fiber.  Instant oatmeal -  cup has about 2g of fiber.  Long-grain brown rice (cooked) - 1 cup has 3.5g of fiber.  Macaroni, enriched (cooked) - 1 cup has 2.5g of fiber.  Melon - 1 cup has 1.4g of fiber.  Multigrain cereal -  cup has about 2-4g of fiber.  Orange - 1 small has 3.1g of fiber.  Potatoes, mashed -  cup has 1.6g of fiber.  Raisins - 1/4 cup has 1.6g of fiber.  Squash -  cup has 2.9g of fiber.  Sunflower seeds -  cup has 1.1g of fiber.   Tomato - 1 medium has 1.5g of fiber.  Vegetable or soy patty - 1 has 3.4g of fiber.  Whole-wheat bread - 1 slice has 2g of fiber.  Whole-wheat spaghetti -  cup has 3.2g of fiber. Low-fiber foods Low-fiber foods contain less than 1 gram (less than 1g) of fiber per serving. They include:  Egg - 1 large.  Flour tortilla - one 6-inch (15-cm) tortilla.  Fruit juice -  cup.  Lettuce - 1 cup.  Meat, poultry, or fish - 1 oz.  Milk - 1 cup.  Spinach (raw) - 1 cup.  White bread - 1 slice.  White rice -  cup.  Yogurt -  cup. Actual amounts of fiber in foods may be different depending on processing. Talk with your dietitian about how much fiber you need in your diet. This information is not intended to replace advice given to you by  your health care provider. Make sure you discuss any questions you have with your health care provider. Document Released: 06/17/2006 Document Revised: 09/22/2015 Document Reviewed: 03/24/2015 Elsevier Patient Education  2020 [employer].  Constipation, Adult Constipation is when a person:  Poops (has a bowel movement) fewer times in a week than normal.  Has a hard time pooping.  Has poop that is dry, hard, or bigger than normal. Follow these instructions at home: Eating and drinking   Eat foods that have a lot of fiber, such as: ? Fresh fruits and vegetables. ? Whole grains. ? Beans.  Eat less of foods that are high in fat, low in fiber, or overly processed, such as: ? [place] fries. ? Hamburgers. ? Cookies. ? Candy. ? Soda.  Drink enough fluid to keep your pee (urine) clear or pale yellow. General instructions  Exercise regularly or as told by your doctor.  Go to the restroom when you feel like you need to poop. Do not hold it in.  Take over-the-counter and prescription medicines only as told by your doctor. These include any fiber supplements.  Do pelvic floor retraining exercises, such as: ? Doing deep breathing while  relaxing your lower belly (abdomen). ? Relaxing your pelvic floor while pooping.  Watch your condition for any changes.  Keep all follow-up visits as told by your doctor. This is important. Contact a doctor if:  You have pain that gets worse.  You have a fever.  You have not pooped for 4 days.  You throw up (vomit).  You are not hungry.  You lose weight.  You are bleeding from the anus.  You have thin, pencil-like poop (stool). Get help right away if:  You have a fever, and your symptoms suddenly get worse.  You leak poop or have blood in your poop.  Your belly feels hard or bigger than normal (is bloated).  You have very bad belly pain.  You feel dizzy or you faint. This information is not intended to replace advice given to you by your health care provider. Make sure you discuss any questions you have with your health care provider. Document Released: 07/18/2007 Document Revised: 01/11/2017 Document Reviewed: 07/20/2015 Elsevier Patient Education  [place].     Diverticulitis  Diverticulitis is when small pockets in your large intestine (colon) get infected or swollen. This causes stomach pain and watery poop (diarrhea). These pouches are called diverticula. They form in people who have a condition called diverticulosis. Follow these instructions at home: Medicines  Take over-the-counter and prescription medicines only as told by your doctor. These include: ? Antibiotics. ? Pain medicines. ? Fiber pills. ? Probiotics. ? Stool softeners.  Do not drive or use heavy machinery while taking prescription pain medicine.  If you were prescribed an antibiotic, take it as told. Do not stop taking it even if you feel better. General instructions   Follow a diet as told by your doctor.  When you feel better, your doctor may tell you to change your diet. You may need to eat a lot of fiber. Fiber makes it easier to poop (have bowel movements). Healthy  foods with fiber include: ? Berries. ? Beans. ? Lentils. ? Green vegetables.  Exercise 3 or more times a week. Aim for 30 minutes each time. Exercise enough to sweat and make your heart beat faster.  Keep all follow-up visits as told. This is important. You may need to have an exam of the large intestine. This is called  a colonoscopy. Contact a doctor if:  Your pain does not get better.  You have a hard time eating or drinking.  You are not pooping like normal. Get help right away if:  Your pain gets worse.  Your problems do not get better.  Your problems get worse very fast.  You have a fever.  You throw up (vomit) more than one time.  You have poop that is: ? Bloody. ? Black. ? Tarry. Summary  Diverticulitis is when small pockets in your large intestine (colon) get infected or swollen.  Take medicines only as told by your doctor.  Follow a diet as told by your doctor. This information is not intended to replace advice given to you by your health care provider. Make sure you discuss any questions you have with your health care provider. Document Released: 07/18/2007 Document Revised: 01/11/2017 Document Reviewed: 02/16/2016 Elsevier Patient Education  2020 [employer].

## 2018-08-14 NOTE — Progress Notes (Signed)
Surgical Clinic History and Physical  Referring provider:  Rusty Aus, MD [place],  [place] [address]  HISTORY OF PRESENT ILLNESS (HPI):  73 y.o. female presents for evaluation of recurrent sigmoid colonic diverticulitis. Patient reports she's experienced 2 - 3 episodes per year over the past 3 years with two cases of which documented on CT imaging. One of these was 7 months ago, for which she presented to [facility] ED and was treated successfully with antibiotics, and she again presented 4 days ago to her primary care physician with LLQ abdominal pain and abdominal bloating, for which she was again prescribed ciprofloxacin and metronidazole. She denies any known history of diverticulitis-associated peri-colonic or pelvic abscess and says her pain has nearly resolved since Monday (4 days ago). She also reports that, although she takes a once daily Colace stool softener, each of her episodes of diverticulitis appears to have been preceded by a period of constipation with straining. She says that she eats foods with fiber and wants to drink more water, but is restricted from drinking more than 32 oz per day due to her chronic diastolic heart failure. She otherwise reports +flatus and BM's recently WNL, denies fever/chills, N/V, CP, or SOB. Patient also recalls she previously had a spine surgery complicated by sepsis and is not currently interested in considering surgery due to this as well as her comorbidities.  PAST MEDICAL HISTORY (PMH):  Past Medical History:  Diagnosis Date  . Anxiety   . Arthritis   . CHF (congestive heart failure) ([place])   . Chronic back pain    stenosis.degenerative disc,some scoliosis  . Constipation    takes Stool Softener daily  . Coronary artery disease   . Depression    takes Cymbalta daily  . Diabetes mellitus    Type 2 diabetic. Average fasting blood sugar runs high 170-200  . Diastolic CHF ([place]) 08/08/9483   Per patient, diagnosed in 2018  . Diverticulosis   . E coli infection   . GERD (gastroesophageal reflux disease)    takes Nexium daily  . Headache   . Hemorrhoids   . History of colon polyps    benign  . History of gout    doesn't take any meds  . History of hiatal hernia   . History of vertigo    doesn't take any meds  . Hyperlipidemia    takes Praluent daily  . Hypertension    currently BP medications are on hold   . Hypothyroidism    takes Synthroid daily  . Insomnia    takes Restoril nightly  . Muscle spasm    takes Robaxin as needed  . NSVT (nonsustained ventricular tachycardia) ([place])   . OSA on CPAP   . Peripheral vascular disease ([place])    AAA as stated per pt / was just discovered and pt states has not been referred to vascular MD   . Restless leg    takes Requip daily  . Rosacea   . Varicose veins     PAST SURGICAL HISTORY ([place]):  Past Surgical History:  Procedure Laterality Date  . ABDOMINAL HYSTERECTOMY     with BSo  . CARDIAC CATHETERIZATION  2013   Normal  . CARPAL TUNNEL RELEASE Bilateral   . CATARACT EXTRACTION, BILATERAL    . CHOLECYSTECTOMY    . COLONOSCOPY    . DIAGNOSTIC LAPAROSCOPY     multiple times  . DILATION AND CURETTAGE OF UTERUS    .  ESOPHAGOGASTRODUODENOSCOPY (EGD) WITH PROPOFOL N/A 11/01/2014   Procedure: ESOPHAGOGASTRODUODENOSCOPY (EGD) WITH PROPOFOL;  Surgeon: Hulen Luster, MD;  Location[facility] ENDOSCOPY;  Service: Gastroenterology;  Laterality: N/A;  . HARDWARE REMOVAL Left 10/11/2016   Procedure: HARDWARE REMOVAL-LEFT RADIUS;  Surgeon: Lovell Sheehan, MD;  Location: [facility] ORS;  Service: Orthopedics;  Laterality: Left;  Left Radius Wrist   . IMPLANTABLE CONTACT LENS IMPLANTATION     bilateral  . LAMINECTOMY  11/13/2015  . LEFT HEART CATH AND CORONARY ANGIOGRAPHY Left 10/01/2017   Procedure: LEFT HEART CATH AND CORONARY ANGIOGRAPHY;  Surgeon: Nelva Bush, MD;  Location: [place] CV LAB;  Service: Cardiovascular;  Laterality:  Left;  . LOOP RECORDER INSERTION N/A 10/17/2017   Procedure: LOOP RECORDER INSERTION;  Surgeon: Deboraha Sprang, MD;  Location: [place] CV LAB;  Service: Cardiovascular;  Laterality: N/A;  . LUMBAR FUSION  11/2015  . LUMBAR WOUND DEBRIDEMENT N/A 12/02/2015   Procedure: WOUND Exploration;  Surgeon: Consuella Lose, MD;  Location: [place];  Service: Neurosurgery;  Laterality: N/A;  . OPEN REDUCTION INTERNAL FIXATION (ORIF) DISTAL RADIAL FRACTURE Left 08/29/2016   Procedure: OPEN REDUCTION INTERNAL FIXATION (ORIF) DISTAL RADIAL FRACTURE;  Surgeon: Lovell Sheehan, MD;  Location: [facility] ORS;  Service: Orthopedics;  Laterality: Left;  . PICC LINE PLACE PERIPHERAL ([place] HX)     right upper arm   . ROTATOR CUFF REPAIR Left   . SAVORY DILATION N/A 11/01/2014   Procedure: SAVORY DILATION;  Surgeon: Hulen Luster, MD;  Location: [facility] ENDOSCOPY;  Service: Gastroenterology;  Laterality: N/A;  . TONSILLECTOMY    . TRIGGER FINGER RELEASE Bilateral      MEDICATIONS:  Prior to Admission medications   Medication Sig Start Date End Date Taking? Authorizing Provider  albuterol (PROVENTIL HFA;VENTOLIN HFA) 108 (90 Base) MCG/ACT inhaler Inhale 2 puffs into the lungs every 6 (six) hours as needed for wheezing or shortness of breath. 01/09/17  Yes Hackney, Tina A, FNP  Alirocumab (PRALUENT) 150 MG/ML SOPN Inject 150 mg into the skin every 14 (fourteen) days. 07/27/17  Yes Bettey Costa, MD  aspirin 81 MG tablet Take 81 mg by mouth at bedtime.    Yes [provider]  atenolol (TENORMIN) 25 MG tablet Take 0.5 tablets (12.5 mg total) by mouth daily. 01/06/18  Yes Minna Merritts, MD  azelastine (ASTELIN) 0.1 % nasal spray Place 2 sprays into both nostrils at bedtime as needed for rhinitis.    Yes [provider]  Cholecalciferol (VITAMIN D) 2000 units tablet Take 2,000 Units by mouth daily.   Yes [provider]  ciprofloxacin (CIPRO) 250 MG/5ML (5%) SUSR Take by mouth.   Yes [provider]  ciprofloxacin (CIPRO) 500 MG/5ML (10%) suspension Take 500 mg by mouth 2 (two) times daily.   Yes [provider]  Docusate Calcium (STOOL SOFTENER PO) Take by mouth.   Yes [provider]  esomeprazole (NEXIUM) 20 MG capsule Take 20 mg by mouth daily.   Yes [provider]  gabapentin (NEURONTIN) 300 MG capsule Take 300 mg by mouth 2 (two) times daily.   Yes [provider]  insulin aspart (NOVOLOG) 100 UNIT/ML FlexPen Inject 10-16 Units into the skin See admin instructions. 10 units with breakfast, 10 units with lunch, 16 units at dinner, >200 increase by 2 units. 03/01/15 12/04/18 Yes [provider]  insulin glargine (LANTUS) 100 UNIT/ML injection Inject 30 Units into the skin every morning.    Yes [provider]  ipratropium-albuterol (  DUONEB) 0.5-2.5 (3) MG/3ML SOLN Take 3 mLs by nebulization every 6 (six) hours as needed. Patient taking differently: Take 3 mLs by nebulization every 6 (six) hours as needed (for shortness of breath/wheezing).  07/27/17  Yes Bettey Costa, MD  levothyroxine (SYNTHROID, LEVOTHROID) 75 MCG tablet Take 75 mcg by mouth daily before breakfast.  11/24/12  Yes [provider]  metroNIDAZOLE (FLAGYL) 500 MG tablet Take 500 mg by mouth 3 (three) times daily.   Yes [provider]  nitroGLYCERIN (NITROSTAT) 0.4 MG SL tablet Place 1 tablet (0.4 mg total) under the tongue every 5 (five) minutes as needed for chest pain. 06/18/18 12/31/19 Yes Gollan, Kathlene November, MD  ondansetron (ZOFRAN) 4 MG tablet Take 4 mg by mouth every 8 (eight) hours as needed for nausea or vomiting.  07/19/17  Yes [provider]  potassium chloride SA (K-DUR,KLOR-CON) 20 MEQ tablet Take 2 tablets (40 mEq total) by mouth daily. 01/06/18  Yes Minna Merritts, MD  ranolazine (RANEXA) 1000 MG SR tablet TAKE 1 TABLET TWICE A DAY 07/02/18  Yes Gollan, Kathlene November, MD  rOPINIRole (REQUIP) 2 MG tablet Take 2 mg by mouth at  bedtime.   Yes [provider]  temazepam (RESTORIL) 30 MG capsule Take 30 mg by mouth at bedtime as needed for sleep.   Yes [provider]  torsemide (DEMADEX) 20 MG tablet Take 40 mg by mouth daily.    Yes [provider]  traMADol (ULTRAM) 50 MG tablet Take 50 mg by mouth daily as needed for moderate pain or severe pain.    Yes [provider]     ALLERGIES:  Allergies  Allergen Reactions  . Reglan [Metoclopramide] Diarrhea  . Tramadol Hives  . Zetia [Ezetimibe] Diarrhea  . Ceftin [Cefuroxime Axetil] Diarrhea  . Codeine Rash       . Penicillins Rash and Other (See Comments)    Has patient had a PCN reaction causing immediate rash, facial/tongue/throat swelling, SOB or lightheadedness with hypotension: NO Has patient had a PCN reaction causing severe rash involving mucus membranes or skin necrosis: NO Has patient had a PCN retioion that required hospitalization NO Has patient had a PCN reaction occurring within the last 10 years: NO If all of the above answers are "NO", then may proceed with Cephalosporin use.  . Vicodin [Hydrocodone-Acetaminophen] Other (See Comments)    passes out     SOCIAL HISTORY:  Social History   Socioeconomic History  . Marital status: Married    Spouse name: Not on file  . Number of children: Not on file  . Years of education: Not on file  . Highest education level: Not on file  Occupational History  . Occupation: retired  Scientific laboratory technician  . Financial resource strain: Not on file  . Food insecurity    Worry: Not on file    Inability: Not on file  . Transportation needs    Medical: Not on file    Non-medical: Not on file  Tobacco Use  . Smoking status: Never Smoker  . Smokeless tobacco: Never Used  Substance and Sexual Activity  . Alcohol use: No  . Drug use: No  . Sexual activity: Not on file  Lifestyle  . Physical activity    Days per week: Not on file    Minutes per session: Not on file  . Stress:  Not on file  Relationships  . Social Herbalist on phone: Not on file    Gets  together: Not on file    Attends religious service: Not on file    Active member of club or organization: Not on file    Attends meetings of clubs or organizations: Not on file    Relationship status: Not on file  . Intimate partner violence    Fear of current or ex partner: Not on file    Emotionally abused: Not on file    Physically abused: Not on file    Forced sexual activity: Not on file  Other Topics Concern  . Not on file  Social History Narrative  . Not on file    The patient currently resides (home / rehab facility / nursing home): Home The patient normally is (ambulatory / bedbound): Ambulatory  FAMILY HISTORY:  Family History  Problem Relation Age of Onset  . Heart disease Mother   . Breast cancer Mother 40  . Heart disease Father   . Heart attack Sister   . Heart disease Sister   . Heart disease Brother     Otherwise negative/non-contributory.  REVIEW OF SYSTEMS:  Constitutional: denies any other weight loss, fever, chills, or sweats  Eyes: denies any other vision changes, history of eye injury  ENT: denies sore throat, hearing problems  Respiratory: denies shortness of breath, wheezing  Cardiovascular: denies chest pain, palpitations  Gastrointestinal: abdominal pain, N/V, and bowel function as per HPI Musculoskeletal: denies any other joint pains or cramps  Skin: Denies any other rashes or skin discolorations Neurological: denies any other headache, dizziness, weakness  Psychiatric: Denies any other depression, anxiety   All other review of systems were otherwise negative   VITAL SIGNS:  BP (!) 155/83   Pulse 86   Temp 97.9 F (36.6 C) (Temporal)   Resp 16   Ht 5\' 1"  (1.549 m)   Wt 210 lb 12.8 oz (95.6 kg)   BMI 39.83 kg/m   PHYSICAL EXAM:  Constitutional:  -- Obese body habitus  -- Awake, alert, and oriented x3  Eyes:  -- Pupils equally round and  reactive to light  -- No scleral icterus  Ear, nose, throat:  -- No jugular venous distension -- No nasal drainage, bleeding Pulmonary:  -- No crackles  -- Equal breath sounds bilaterally -- Breathing non-labored at rest Cardiovascular:  -- S1, S2 present  -- No pericardial rubs  Gastrointestinal:  -- Abdomen soft and non-distended with minimal focal LLQ tenderness to deep palpation, no guarding/rebound tenderness -- No abdominal masses appreciated, pulsatile or otherwise  Musculoskeletal and Integumentary:  -- Wounds or skin discoloration: None appreciated -- Extremities: B/L UE and LE FROM, hands and feet warm  Neurologic:  -- Motor function: Intact and symmetric -- Sensation: Intact and symmetric  Labs:  CBC Latest Ref Rng & Units 05/16/2018 02/22/2018 09/26/2017  WBC 4.0 - 10.5 K/uL 7.3 13.4(H) 7.1  Hemoglobin 12.0 - 15.0 g/dL 12.6 12.5 12.0  Hematocrit 36.0 - 46.0 % 40.6 39.9 37.0  Platelets 150 - 400 K/uL 281 275 255   CMP Latest Ref Rng & Units 05/16/2018 02/22/2018 10/09/2017  Glucose 70 - 99 mg/dL 151(H) 175(H) 197(H)  BUN 8 - 23 mg/dL 21 26(H) 21  Creatinine 0.44 - 1.00 mg/dL 1.30(H) 1.32(H) 1.30(H)  Sodium 135 - 145 mmol/L 141 141 142  Potassium 3.5 - 5.1 mmol/L 3.2(L) 3.6 4.1  Chloride 98 - 111 mmol/L 99 101 97  CO2 22 - 32 mmol/L 31 28 28   Calcium 8.9 - 10.3 mg/dL 9.3 9.4 10.0  Total Protein 6.5 -  8.1 g/dL - 7.1 -  Total Bilirubin 0.3 - 1.2 mg/dL - 0.5 -  Alkaline Phos 38 - 126 U/L - 125 -  AST 15 - 41 U/L - 16 -  ALT 0 - 44 U/L - 16 -   Imaging studies:  CT Abdomen and Pelvis with Contrast (02/22/2018) Focal wall thickening and pericolonic fat stranding involving the sigmoid colon (image 71; series 2). No evidence for perforation or surrounding abscess formation. Normal appendix. No evidence for small bowel obstruction. Small hiatal hernia. Normal morphology of the stomach.   Assessment/Plan:  73 y.o. female with multiply recurrent sigmoid colonic  diverticulitis, complicated by rather extensive co-morbidities including obesity (BMI 40), DM, HTN, HLD, CAD with chronic diastolic CHF, PAD, hypothyroidism, chronic lower back pain, osteoarthritis, gout, restless legs syndrome, generalized anxiety disorder, and major depression disorder.              - complete prescribed course of antibiotics             - maintain hydration and high fiber diet with Colace stool softener x1-2 daily             - upcoming colonoscopy and EGD reportedly scheduled/anticipated             - all risks, benefits, and alternatives to laparoscopic sigmoid colectomy, possible open surgery (considering prior TAH and open cholecystectomy in particular) were discussed with the patient, all of her questions were answered to her expressed satisfaction, patient expresses she does not wish to proceed or to request medical/cardiology risk stratification at this time.             - medical management of comorbidities per primary care/cardiology  - return to clinic as needed, instructed to call if any questions  All of the above recommendations were discussed with the patient, and all of patient's questions were answered to her expressed satisfaction.  Thank you for the opportunity to participate in this patient's care.  -- Marilynne Drivers Rosana Hoes, MD, [place]: [facility] - Partnering for exceptional care. Office: [phone]

## 2018-08-14 NOTE — Telephone Encounter (Signed)
Spoke with patient, advised of Dr. Olin Pia recommendations. She verbalizes understanding, reports she is awaiting GI appointment (backlogged due to [place]). She thanked me for my call and agrees to call for any future episodes so LINQ data can be reviewed.

## 2018-08-21 NOTE — Progress Notes (Signed)
Carelink Summary Report / Loop Recorder 

## 2018-09-11 ENCOUNTER — Telehealth: Payer: Self-pay | Admitting: Pharmacist

## 2018-09-11 MED ORDER — PRALUENT 150 MG/ML ~~LOC~~ SOAJ: 1.0000 "pen " | SUBCUTANEOUS | 3 refills | Status: DC

## 2018-09-11 NOTE — Telephone Encounter (Signed)
Praluent PA approved through 09/11/19, updated rx sent to pharmacy.

## 2018-09-12 ENCOUNTER — Ambulatory Visit (INDEPENDENT_AMBULATORY_CARE_PROVIDER_SITE_OTHER): Payer: Medicare Other | Admitting: *Deleted

## 2018-09-12 DIAGNOSIS — R55 Syncope and collapse: Secondary | ICD-10-CM | POA: Diagnosis not present

## 2018-09-13 LAB — CUP PACEART REMOTE DEVICE CHECK
Date Time Interrogation Session: 20200731184211
Implantable Pulse Generator Implant Date: 20190905

## 2018-09-18 NOTE — Progress Notes (Signed)
Carelink Summary Report / Loop Recorder 

## 2018-10-15 ENCOUNTER — Other Ambulatory Visit: Payer: Self-pay

## 2018-10-15 ENCOUNTER — Ambulatory Visit (INDEPENDENT_AMBULATORY_CARE_PROVIDER_SITE_OTHER): Payer: Medicare Other | Admitting: *Deleted

## 2018-10-15 DIAGNOSIS — Z20822 Contact with and (suspected) exposure to covid-19: Secondary | ICD-10-CM

## 2018-10-15 DIAGNOSIS — R55 Syncope and collapse: Secondary | ICD-10-CM

## 2018-10-16 LAB — CUP PACEART REMOTE DEVICE CHECK
Date Time Interrogation Session: 20200902194041
Implantable Pulse Generator Implant Date: 20190905

## 2018-10-16 LAB — NOVEL CORONAVIRUS, NAA: SARS-CoV-2, NAA: NOT DETECTED

## 2018-10-30 NOTE — Progress Notes (Signed)
Carelink Summary Report / Loop Recorder 

## 2018-11-12 ENCOUNTER — Other Ambulatory Visit: Payer: Self-pay

## 2018-11-12 DIAGNOSIS — Z20822 Contact with and (suspected) exposure to covid-19: Secondary | ICD-10-CM

## 2018-11-13 LAB — NOVEL CORONAVIRUS, NAA: SARS-CoV-2, NAA: NOT DETECTED

## 2018-11-14 ENCOUNTER — Other Ambulatory Visit: Payer: Medicare Other | Attending: Internal Medicine

## 2018-11-17 ENCOUNTER — Ambulatory Visit (INDEPENDENT_AMBULATORY_CARE_PROVIDER_SITE_OTHER): Payer: Medicare Other | Admitting: *Deleted

## 2018-11-17 DIAGNOSIS — I471 Supraventricular tachycardia: Secondary | ICD-10-CM

## 2018-11-17 DIAGNOSIS — I5032 Chronic diastolic (congestive) heart failure: Secondary | ICD-10-CM

## 2018-11-18 LAB — CUP PACEART REMOTE DEVICE CHECK
Date Time Interrogation Session: 20201005193851
Implantable Pulse Generator Implant Date: 20190905

## 2018-11-19 ENCOUNTER — Encounter: Admission: RE | Payer: Self-pay | Source: Home / Self Care

## 2018-11-19 ENCOUNTER — Ambulatory Visit: Admission: RE | Admit: 2018-11-19 | Payer: Medicare Other | Source: Home / Self Care | Admitting: Internal Medicine

## 2018-11-19 SURGERY — COLONOSCOPY WITH PROPOFOL
Anesthesia: General

## 2018-11-21 ENCOUNTER — Other Ambulatory Visit
Admission: RE | Admit: 2018-11-21 | Discharge: 2018-11-21 | Disposition: A | Discharge status: Home / Self Care | Payer: Medicare Other | Source: Ambulatory Visit | Attending: Internal Medicine | Admitting: Internal Medicine

## 2018-11-21 ENCOUNTER — Other Ambulatory Visit: Payer: Self-pay

## 2018-11-21 DIAGNOSIS — Z20828 Contact with and (suspected) exposure to other viral communicable diseases: Secondary | ICD-10-CM | POA: Insufficient documentation

## 2018-11-21 DIAGNOSIS — Z01812 Encounter for preprocedural laboratory examination: Secondary | ICD-10-CM | POA: Diagnosis present

## 2018-11-21 LAB — SARS CORONAVIRUS 2 (TAT 6-24 HRS): SARS Coronavirus 2: NEGATIVE

## 2018-11-24 ENCOUNTER — Encounter: Payer: Self-pay | Admitting: *Deleted

## 2018-11-25 ENCOUNTER — Ambulatory Visit: Payer: Medicare Other | Admitting: Certified Registered"

## 2018-11-25 ENCOUNTER — Ambulatory Visit
Admission: RE | Admit: 2018-11-25 | Discharge: 2018-11-25 | Disposition: A | Discharge status: Home / Self Care | Payer: Medicare Other | Attending: Internal Medicine | Admitting: Internal Medicine

## 2018-11-25 ENCOUNTER — Encounter
Admission: RE | Disposition: A | Discharge status: Home / Self Care | Payer: Self-pay | Source: Home / Self Care | Attending: Internal Medicine

## 2018-11-25 ENCOUNTER — Other Ambulatory Visit: Payer: Self-pay

## 2018-11-25 DIAGNOSIS — Z1211 Encounter for screening for malignant neoplasm of colon: Secondary | ICD-10-CM | POA: Insufficient documentation

## 2018-11-25 DIAGNOSIS — F329 Major depressive disorder, single episode, unspecified: Secondary | ICD-10-CM | POA: Insufficient documentation

## 2018-11-25 DIAGNOSIS — K5730 Diverticulosis of large intestine without perforation or abscess without bleeding: Secondary | ICD-10-CM | POA: Diagnosis not present

## 2018-11-25 DIAGNOSIS — Z79899 Other long term (current) drug therapy: Secondary | ICD-10-CM | POA: Insufficient documentation

## 2018-11-25 DIAGNOSIS — Z79890 Hormone replacement therapy (postmenopausal): Secondary | ICD-10-CM | POA: Insufficient documentation

## 2018-11-25 DIAGNOSIS — Z86010 Personal history of colonic polyps: Secondary | ICD-10-CM | POA: Diagnosis not present

## 2018-11-25 DIAGNOSIS — F419 Anxiety disorder, unspecified: Secondary | ICD-10-CM | POA: Diagnosis not present

## 2018-11-25 DIAGNOSIS — M1990 Unspecified osteoarthritis, unspecified site: Secondary | ICD-10-CM | POA: Insufficient documentation

## 2018-11-25 DIAGNOSIS — G2581 Restless legs syndrome: Secondary | ICD-10-CM | POA: Diagnosis not present

## 2018-11-25 DIAGNOSIS — I5032 Chronic diastolic (congestive) heart failure: Secondary | ICD-10-CM | POA: Diagnosis not present

## 2018-11-25 DIAGNOSIS — I2510 Atherosclerotic heart disease of native coronary artery without angina pectoris: Secondary | ICD-10-CM | POA: Diagnosis not present

## 2018-11-25 DIAGNOSIS — E1151 Type 2 diabetes mellitus with diabetic peripheral angiopathy without gangrene: Secondary | ICD-10-CM | POA: Insufficient documentation

## 2018-11-25 DIAGNOSIS — E785 Hyperlipidemia, unspecified: Secondary | ICD-10-CM | POA: Insufficient documentation

## 2018-11-25 DIAGNOSIS — I110 Hypertensive heart disease with heart failure: Secondary | ICD-10-CM | POA: Diagnosis not present

## 2018-11-25 DIAGNOSIS — Z888 Allergy status to other drugs, medicaments and biological substances status: Secondary | ICD-10-CM | POA: Insufficient documentation

## 2018-11-25 DIAGNOSIS — K3189 Other diseases of stomach and duodenum: Secondary | ICD-10-CM | POA: Insufficient documentation

## 2018-11-25 DIAGNOSIS — K222 Esophageal obstruction: Secondary | ICD-10-CM | POA: Insufficient documentation

## 2018-11-25 DIAGNOSIS — Z881 Allergy status to other antibiotic agents status: Secondary | ICD-10-CM | POA: Insufficient documentation

## 2018-11-25 DIAGNOSIS — Z7982 Long term (current) use of aspirin: Secondary | ICD-10-CM | POA: Insufficient documentation

## 2018-11-25 DIAGNOSIS — Z794 Long term (current) use of insulin: Secondary | ICD-10-CM | POA: Insufficient documentation

## 2018-11-25 DIAGNOSIS — G4700 Insomnia, unspecified: Secondary | ICD-10-CM | POA: Diagnosis not present

## 2018-11-25 DIAGNOSIS — D125 Benign neoplasm of sigmoid colon: Secondary | ICD-10-CM | POA: Diagnosis not present

## 2018-11-25 DIAGNOSIS — K219 Gastro-esophageal reflux disease without esophagitis: Secondary | ICD-10-CM | POA: Insufficient documentation

## 2018-11-25 DIAGNOSIS — R1314 Dysphagia, pharyngoesophageal phase: Secondary | ICD-10-CM | POA: Insufficient documentation

## 2018-11-25 DIAGNOSIS — E039 Hypothyroidism, unspecified: Secondary | ICD-10-CM | POA: Diagnosis not present

## 2018-11-25 DIAGNOSIS — Z880 Allergy status to penicillin: Secondary | ICD-10-CM | POA: Insufficient documentation

## 2018-11-25 DIAGNOSIS — Z885 Allergy status to narcotic agent status: Secondary | ICD-10-CM | POA: Insufficient documentation

## 2018-11-25 DIAGNOSIS — K641 Second degree hemorrhoids: Secondary | ICD-10-CM | POA: Diagnosis not present

## 2018-11-25 DIAGNOSIS — G4733 Obstructive sleep apnea (adult) (pediatric): Secondary | ICD-10-CM | POA: Diagnosis not present

## 2018-11-25 HISTORY — PX: COLONOSCOPY WITH PROPOFOL: SHX5780

## 2018-11-25 HISTORY — PX: ESOPHAGOGASTRODUODENOSCOPY (EGD) WITH PROPOFOL: SHX5813

## 2018-11-25 LAB — GLUCOSE, CAPILLARY: Glucose-Capillary: 103 mg/dL — ABNORMAL HIGH (ref 70–99)

## 2018-11-25 SURGERY — COLONOSCOPY WITH PROPOFOL
Anesthesia: General

## 2018-11-25 MED ORDER — SODIUM CHLORIDE 0.9 % IV SOLN
INTRAVENOUS | Status: DC | PRN
  Administered 2018-11-25: 10:00:00 via INTRAVENOUS

## 2018-11-25 MED ORDER — PHENYLEPHRINE HCL (PRESSORS) 10 MG/ML IV SOLN
INTRAVENOUS | Status: DC | PRN
  Administered 2018-11-25: 100 ug via INTRAVENOUS

## 2018-11-25 MED ORDER — PROPOFOL 500 MG/50ML IV EMUL
INTRAVENOUS | Status: DC | PRN
  Administered 2018-11-25: 140 ug/kg/min via INTRAVENOUS

## 2018-11-25 MED ORDER — GLYCOPYRROLATE 0.2 MG/ML IJ SOLN
INTRAMUSCULAR | Status: DC | PRN
  Administered 2018-11-25 (×2): 0.2 mg via INTRAVENOUS

## 2018-11-25 MED ORDER — LIDOCAINE HCL (CARDIAC) PF 100 MG/5ML IV SOSY
PREFILLED_SYRINGE | INTRAVENOUS | Status: DC | PRN
  Administered 2018-11-25: 100 mg via INTRATRACHEAL

## 2018-11-25 MED ORDER — PROPOFOL 10 MG/ML IV BOLUS
INTRAVENOUS | Status: DC | PRN
  Administered 2018-11-25: 10 mg via INTRAVENOUS
  Administered 2018-11-25: 50 mg via INTRAVENOUS
  Administered 2018-11-25: 10 mg via INTRAVENOUS

## 2018-11-25 MED ORDER — SODIUM CHLORIDE 0.9 % IV SOLN
INTRAVENOUS | Status: DC
  Administered 2018-11-25: 10:00:00 via INTRAVENOUS

## 2018-11-25 NOTE — Op Note (Signed)
[facility] Gastroenterology Patient Name: [name] Procedure Date: 11/25/2018 9:30 AM MRN: [id number] Account #: [id number] Date of Birth: November 10, 1945 Admit Type: Outpatient Age: 73 Room: [facility] ENDO ROOM 2 Gender: Female Note Status: Finalized Procedure:            Colonoscopy Indications:          Screening for colorectal malignant neoplasm Providers:            Benay Pike. Alice Reichert MD, MD Referring MD:         Rusty Aus, MD (Referring MD) Medicines:            Propofol per Anesthesia Complications:        No immediate complications. Procedure:            Pre-Anesthesia Assessment:                       - The risks and benefits of the procedure and the                        sedation options and risks were discussed with the                        patient. All questions were answered and informed                        consent was obtained.                       - Patient identification and proposed procedure were                        verified prior to the procedure by the nurse. The                        procedure was verified in the procedure room.                       - ASA Grade Assessment: III - A patient with severe                        systemic disease.                       - After reviewing the risks and benefits, the patient                        was deemed in satisfactory condition to undergo the                        procedure.                       After obtaining informed consent, the colonoscope was                        passed under direct vision. Throughout the procedure,                        the patient's blood pressure, pulse, and oxygen  saturations were monitored continuously. The                        Colonoscope was introduced through the anus and                        advanced to the the cecum, identified by appendiceal                        orifice and ileocecal valve. The patient tolerated the                         procedure well. The colonoscopy was somewhat difficult                        due to restricted mobility of the colon. Successful                        completion of the procedure was aided by applying                        abdominal pressure. Findings:      The perianal and digital rectal examinations were normal. Pertinent       negatives include normal sphincter tone and no palpable rectal lesions.      Non-bleeding internal hemorrhoids were found during retroflexion. The       hemorrhoids were Grade II (internal hemorrhoids that prolapse but reduce       spontaneously).      Many small-mouthed diverticula were found in the left colon.      A 11 mm polyp was found in the ileocecal valve. The polyp was sessile.       The polyp was removed with a cold snare. Resection and retrieval were       complete. To prevent bleeding after the polypectomy, three hemostatic       clips were successfully placed (MR conditional). There was no bleeding       during, or at the end, of the procedure. Estimated blood loss was       minimal.      Two sessile polyps were found in the sigmoid colon and ileocecal valve.       The polyps were 5 to 7 mm in size. These polyps were removed with a cold       snare. Resection and retrieval were complete.      The exam was otherwise without abnormality. Impression:           - Non-bleeding internal hemorrhoids.                       - Diverticulosis in the left colon.                       - One 11 mm polyp at the ileocecal valve, removed with                        a cold snare. Resected and retrieved. Clips (MR                        conditional) were placed.                       -  Two 5 to 7 mm polyps in the sigmoid colon and at the                        ileocecal valve, removed with a cold snare. Resected                        and retrieved.                       - The examination was otherwise normal. Recommendation:       -  Patient has a contact number available for                        emergencies. The signs and symptoms of potential                        delayed complications were discussed with the patient.                        Return to normal activities tomorrow. Written discharge                        instructions were provided to the patient.                       - Resume previous diet.                       - Continue present medications.                       - Repeat colonoscopy is recommended for surveillance.                        The colonoscopy date will be determined after pathology                        results from today's exam become available for review.                       - Return to physician assistant in 3 months.                       - You will be seen by Octavia Bruckner, PA-C for your                        follow up visit in the office. Procedure Code(s):    --- Professional ---                       [phone], Colonoscopy, flexible; with removal of tumor(s),                        polyp(s), or other lesion(s) by snare technique Diagnosis Code(s):    --- Professional ---                       K57.30, Diverticulosis of large intestine without                        perforation or abscess without bleeding  K64.1, Second degree hemorrhoids                       K63.5, Polyp of colon                       Z12.11, Encounter for screening for malignant neoplasm                        of colon CPT copyright 2019 [employer]. All rights reserved. The codes documented in this report are preliminary and upon coder review may  be revised to meet current compliance requirements. Efrain Sella MD, MD 11/25/2018 10:37:37 AM This report has been signed electronically. Number of Addenda: 0 Note Initiated On: 11/25/2018 9:30 AM Scope Withdrawal Time: 0 hours 19 minutes 40 seconds  Total Procedure Duration: 0 hours 25 minutes 58 seconds  Estimated  Blood Loss: Estimated blood loss was minimal.      [facility]

## 2018-11-25 NOTE — Progress Notes (Signed)
Carelink Summary Report / Loop Recorder 

## 2018-11-25 NOTE — H&P (Signed)
Outpatient short stay form Pre-procedure 11/25/2018 8:46 AM Teodoro K. Alice Reichert, M.D.  Primary Physician: Emily Filbert, M.D.  Reason for visit:  Colon cancer screening, Dysphagia, hx of esophageal stricture  History of present illness:  As above. Patient has occasional cramping from presumed painful diverticular disease. No rectal bleeding. Patient has intermittent solid food dysphagia.    No current facility-administered medications for this encounter.   Facility-Administered Medications Ordered in Other Encounters:  .  dexamethasone (DECADRON) injection 10 mg, 10 mg, Intravenous, Once, Lovell Sheehan, MD  Medications Prior to Admission  Medication Sig Dispense Refill Last Dose  . albuterol (PROVENTIL HFA;VENTOLIN HFA) 108 (90 Base) MCG/ACT inhaler Inhale 2 puffs into the lungs every 6 (six) hours as needed for wheezing or shortness of breath. 3 Inhaler 3   . Alirocumab (PRALUENT) 150 MG/ML SOAJ Inject 1 pen into the skin every 14 (fourteen) days. 6 pen 3   . aspirin 81 MG tablet Take 81 mg by mouth at bedtime.      [name] atenolol (TENORMIN) 25 MG tablet Take 0.5 tablets (12.5 mg total) by mouth daily. 45 tablet 4   . azelastine (ASTELIN) 0.1 % nasal spray Place 2 sprays into both nostrils at bedtime as needed for rhinitis.      . Cholecalciferol (VITAMIN D) 2000 units tablet Take 2,000 Units by mouth daily.     . ciprofloxacin (CIPRO) 250 MG/5ML (5%) SUSR Take by mouth.     . ciprofloxacin (CIPRO) 500 MG/5ML (10%) suspension Take 500 mg by mouth 2 (two) times daily.     Mariane Baumgarten Calcium (STOOL SOFTENER PO) Take by mouth.     . esomeprazole (NEXIUM) 20 MG capsule Take 20 mg by mouth daily.     [name] gabapentin (NEURONTIN) 300 MG capsule Take 300 mg by mouth 2 (two) times daily.     . insulin aspart (NOVOLOG) 100 UNIT/ML FlexPen Inject 10-16 Units into the skin See admin instructions. 10 units with breakfast, 10 units with lunch, 16 units at dinner, >200 increase by 2 units.     . insulin  glargine (LANTUS) 100 UNIT/ML injection Inject 30 Units into the skin every morning.      [name] ipratropium-albuterol (DUONEB) 0.5-2.5 (3) MG/3ML SOLN Take 3 mLs by nebulization every 6 (six) hours as needed. (Patient taking differently: Take 3 mLs by nebulization every 6 (six) hours as needed (for shortness of breath/wheezing). )     . levothyroxine (SYNTHROID, LEVOTHROID) 75 MCG tablet Take 75 mcg by mouth daily before breakfast.      . metroNIDAZOLE (FLAGYL) 500 MG tablet Take 500 mg by mouth 3 (three) times daily.     . nitroGLYCERIN (NITROSTAT) 0.4 MG SL tablet Place 1 tablet (0.4 mg total) under the tongue every 5 (five) minutes as needed for chest pain. 25 tablet 3   . ondansetron (ZOFRAN) 4 MG tablet Take 4 mg by mouth every 8 (eight) hours as needed for nausea or vomiting.      . potassium chloride SA (K-DUR,KLOR-CON) 20 MEQ tablet Take 2 tablets (40 mEq total) by mouth daily. 180 tablet 3   . ranolazine (RANEXA) 1000 MG SR tablet TAKE 1 TABLET TWICE A DAY 180 tablet 2   . rOPINIRole (REQUIP) 2 MG tablet Take 2 mg by mouth at bedtime.     . temazepam (RESTORIL) 30 MG capsule Take 30 mg by mouth at bedtime as needed for sleep.     [name] torsemide (DEMADEX) 20 MG tablet Take 40 mg by  mouth daily.      . traMADol (ULTRAM) 50 MG tablet Take 50 mg by mouth daily as needed for moderate pain or severe pain.         Allergies  Allergen Reactions  . Darvon [Propoxyphene]   . Reglan [Metoclopramide] Diarrhea  . Statins   . Tramadol Hives  . Zetia [Ezetimibe] Diarrhea  . Ceftin [Cefuroxime Axetil] Diarrhea  . Codeine Rash       . Penicillins Rash and Other (See Comments)    Has patient had a PCN reaction causing immediate rash, facial/tongue/throat swelling, SOB or lightheadedness with hypotension: NO Has patient had a PCN reaction causing severe rash involving mucus membranes or skin necrosis: NO Has patient had a PCN retioion that required hospitalization NO Has patient had a PCN reaction  occurring within the last 10 years: NO If all of the above answers are "NO", then may proceed with Cephalosporin use.  . Vicodin [Hydrocodone-Acetaminophen] Other (See Comments)    passes out     Past Medical History:  Diagnosis Date  . Anxiety   . Arthritis   . CHF (congestive heart failure) ([place])   . Chronic back pain    stenosis.degenerative disc,some scoliosis  . Constipation    takes Stool Softener daily  . Coronary artery disease   . Depression    takes Cymbalta daily  . Diabetes mellitus    Type 2 diabetic. Average fasting blood sugar runs high 170-200  . Diastolic CHF ([place]) [id number]   Per patient, diagnosed in 2018  . Diverticulosis   . E coli infection   . GERD (gastroesophageal reflux disease)    takes Nexium daily  . Headache   . Hemorrhoids   . History of colon polyps    benign  . History of gout    doesn't take any meds  . History of hiatal hernia   . History of vertigo    doesn't take any meds  . Hyperlipidemia    takes Praluent daily  . Hypertension    currently BP medications are on hold   . Hypothyroidism    takes Synthroid daily  . Insomnia    takes Restoril nightly  . Muscle spasm    takes Robaxin as needed  . NSVT (nonsustained ventricular tachycardia) ([place])   . OSA on CPAP   . Peripheral vascular disease ([place])    AAA as stated per pt / was just discovered and pt states has not been referred to vascular MD   . Restless leg    takes Requip daily  . Rosacea   . Varicose veins     Review of systems:  Otherwise negative.    Physical Exam  Gen: Alert, oriented. Appears stated age.  HEENT: [place]/AT. PERRLA. Lungs: CTA, no wheezes. CV: RR nl S1, S2. Abd: soft, benign, no masses. BS+ Ext: No edema. Pulses 2+    Planned procedures: Proceed with EGD and colonoscopy. The patient understands the nature of the planned procedure, indications, risks, alternatives and potential complications including but not limited to bleeding, infection,  perforation, damage to internal organs and possible oversedation/side effects from anesthesia. The patient agrees and gives consent to proceed.  Please refer to procedure notes for findings, recommendations and patient disposition/instructions.     Teodoro K. Alice Reichert, M.D. Gastroenterology 11/25/2018  8:46 AM

## 2018-11-25 NOTE — Anesthesia Postprocedure Evaluation (Signed)
Anesthesia Post Note  Patient: [name]  Procedure(s) Performed: COLONOSCOPY WITH PROPOFOL (N/A ) ESOPHAGOGASTRODUODENOSCOPY (EGD) WITH PROPOFOL (N/A )  Patient location during evaluation: Endoscopy Anesthesia Type: General Level of consciousness: awake and alert and oriented Pain management: pain level controlled Vital Signs Assessment: post-procedure vital signs reviewed and stable Respiratory status: spontaneous breathing, nonlabored ventilation and respiratory function stable Cardiovascular status: blood pressure returned to baseline and stable Postop Assessment: no signs of nausea or vomiting Anesthetic complications: no     Last Vitals:  Vitals:   11/25/18 1050 11/25/18 1100  BP: 121/68 (!) 113/59  Pulse: 84 82  Resp: (!) 22 14  Temp:    SpO2: 100% 100%    Last Pain:  Vitals:   11/25/18 1030  TempSrc: Tympanic                 Dolan Xia

## 2018-11-25 NOTE — OR Nursing (Signed)
C/0 gas  Pains in abdomen. DR Alice Reichert talked with pt and told her to try to pass gas.

## 2018-11-25 NOTE — Interval H&P Note (Signed)
History and Physical Interval Note:  11/25/2018 8:48 AM  [name]  has presented today for surgery, with the diagnosis of SCREENING GERD.  The various methods of treatment have been discussed with the patient and family. After consideration of risks, benefits and other options for treatment, the patient has consented to  Procedure(s): COLONOSCOPY WITH PROPOFOL (N/A) ESOPHAGOGASTRODUODENOSCOPY (EGD) WITH PROPOFOL (N/A) as a surgical intervention.  The patient's history has been reviewed, patient examined, no change in status, stable for surgery.  I have reviewed the patient's chart and labs.  Questions were answered to the patient's satisfaction.     [place], [place]

## 2018-11-25 NOTE — Op Note (Signed)
[facility]enter[facility] Patient Name: [name] Procedure Date: 11/25/2018 9:30 AM MRN: [id number] Account #: [id number] Date of Birth: [birth year] Admit Type: Outpatient Age: 73 Room: [facility] ENDO ROOM 2 Gender: Female Note Status: Finalized Procedure:            Upper GI endoscopy Indications:          Esophageal dysphagia, Stricture of the esophagus Providers:            Benay Pike. Alice Reichert MD, MD Referring MD:         Rusty Aus, MD (Referring MD) Medicines:            Propofol per Anesthesia Complications:        No immediate complications. Procedure:            Pre-Anesthesia Assessment:                       - The risks and benefits of the procedure and the                        sedation options and risks were discussed with the                        patient. All questions were answered and informed                        consent was obtained.                       - Patient identification and proposed procedure were                        verified prior to the procedure by the nurse. The                        procedure was verified in the procedure room.                       - ASA Grade Assessment: III - A patient with severe                        systemic disease.                       - After reviewing the risks and benefits, the patient                        was deemed in satisfactory condition to undergo the                        procedure.                       After obtaining informed consent, the endoscope was                        passed under direct vision. Throughout the procedure,                        the patient's blood pressure, pulse, and oxygen  saturations were monitored continuously. The Endoscope                        was introduced through the mouth, and advanced to the                        third part of duodenum. The upper GI endoscopy was                        accomplished without difficulty.  The patient tolerated                        the procedure well. Findings:      One benign-appearing, intrinsic mild stenosis was found in the distal       esophagus. This stenosis measured 1.3 cm (inner diameter) x less than       one cm (in length). The stenosis was traversed. The scope was withdrawn.       Dilation was performed with a Maloney dilator with no resistance at 53       Fr.      Localized moderate mucosal variance characterized by altered texture was       found in the prepyloric region of the stomach. Biopsies were taken with       a cold forceps for histology.      The cardia and gastric fundus were normal on retroflexion.      The examined duodenum was normal. Impression:           - Benign-appearing esophageal stenosis. Dilated.                       - Gastric mucosal variant. Biopsied.                       - Normal examined duodenum. Recommendation:       - Await pathology results.                       - Monitor results to esophageal dilation                       - Proceed with colonoscopy Procedure Code(s):    --- Professional ---                       [phone], Esophagogastroduodenoscopy, flexible, transoral;                        with biopsy, single or multiple                       43450, Dilation of esophagus, by unguided sound or                        bougie, single or multiple passes Diagnosis Code(s):    --- Professional ---                       R13.14, Dysphagia, pharyngoesophageal phase                       K31.89, Other diseases of stomach and duodenum  K22.2, Esophageal obstruction CPT copyright 2019 American Medical Association. All rights reserved. The codes documented in this report are preliminary and upon coder review may  be revised to meet current compliance requirements. Efrain Sella MD, MD 11/25/2018 10:04:19 AM This report has been signed electronically. Number of Addenda: 0 Note Initiated On: 11/25/2018 9:30  AM Estimated Blood Loss: Estimated blood loss: none.      [facility]

## 2018-11-25 NOTE — Transfer of Care (Signed)
Immediate Anesthesia Transfer of Care Note  Patient: [name]  Procedure(s) Performed: COLONOSCOPY WITH PROPOFOL (N/A ) ESOPHAGOGASTRODUODENOSCOPY (EGD) WITH PROPOFOL (N/A )  Patient Location: Endoscopy Unit  Anesthesia Type:General  Level of Consciousness: drowsy, patient cooperative and responds to stimulation  Airway & Oxygen Therapy: Patient Spontanous Breathing and Patient connected to face mask oxygen  Post-op Assessment: Report given to RN and Post -op Vital signs reviewed and stable  Post vital signs: Reviewed and stable  Last Vitals:  Vitals Value Taken Time  BP 113/60 11/25/18 1036  Temp    Pulse 81 11/25/18 1037  Resp 16 11/25/18 1037  SpO2 100 % 11/25/18 1037  Vitals shown include unvalidated device data.  Last Pain:  Vitals:   11/25/18 0924  TempSrc: Tympanic         Complications: No apparent anesthesia complications

## 2018-11-25 NOTE — Anesthesia Post-op Follow-up Note (Signed)
Anesthesia QCDR form completed.        

## 2018-11-25 NOTE — Anesthesia Preprocedure Evaluation (Signed)
Anesthesia Evaluation  Patient identified by MRN, date of birth, ID band Patient awake    Reviewed: Allergy & Precautions, NPO status , Patient's Chart, lab work & pertinent test results  History of Anesthesia Complications Negative for: history of anesthetic complications  Airway Mallampati: III  TM Distance: >3 FB Neck ROM: Full    Dental no notable dental hx.    Pulmonary sleep apnea and Continuous Positive Airway Pressure Ventilation , neg COPD,    breath sounds clear to auscultation- rhonchi (-) wheezing      Cardiovascular hypertension, + CAD (nonocclusive), + Peripheral Vascular Disease and +CHF (preserved EF)  (-) Past MI, (-) Cardiac Stents and (-) CABG  Rhythm:Regular Rate:Normal - Systolic murmurs and - Diastolic murmurs [id number]: - Left ventricle: The cavity size was normal. Systolic function was   normal. The estimated ejection fraction was in the range of 60%   to 65%. Wall motion was normal; there were no regional wall   motion abnormalities. Features are consistent with a pseudonormal   left ventricular filling pattern, with concomitant abnormal   relaxation and increased filling pressure (grade 2 diastolic   dysfunction). - Left atrium: The atrium was normal in size. - Right ventricle: Systolic function was normal. - Pulmonary arteries: Systolic pressure was within the normal   range.    Neuro/Psych  Headaches, neg Seizures PSYCHIATRIC DISORDERS Anxiety Depression    GI/Hepatic Neg liver ROS, hiatal hernia, GERD  ,  Endo/Other  diabetes, Insulin DependentHypothyroidism   Renal/GU Renal InsufficiencyRenal disease     Musculoskeletal  (+) Arthritis ,   Abdominal (+) + obese,   Peds  Hematology negative hematology ROS (+)   Anesthesia Other Findings Past Medical History: No date: Anxiety No date: Arthritis No date: CHF (congestive heart failure) ([facility]) No date: Chronic back pain     Comment:   stenosis.degenerative disc,some scoliosis No date: Constipation     Comment:  takes Stool Softener daily No date: Coronary artery disease No date: Depression     Comment:  takes Cymbalta daily No date: Diabetes mellitus     Comment:  Type 2 diabetic. Average fasting blood sugar runs high               170-200 [id number]: Diastolic CHF ([facility])     Comment:  Per patient, diagnosed in 2018 No date: Diverticulosis No date: E coli infection No date: GERD (gastroesophageal reflux disease)     Comment:  takes Nexium daily No date: Headache No date: Hemorrhoids No date: History of colon polyps     Comment:  benign No date: History of gout     Comment:  doesn't take any meds No date: History of hiatal hernia No date: History of vertigo     Comment:  doesn't take any meds No date: Hyperlipidemia     Comment:  takes Praluent daily No date: Hypertension     Comment:  currently BP medications are on hold  No date: Hypothyroidism     Comment:  takes Synthroid daily No date: Insomnia     Comment:  takes Restoril nightly No date: Muscle spasm     Comment:  takes Robaxin as needed No date: NSVT (nonsustained ventricular tachycardia) ([facility]) No date: OSA on CPAP No date: Peripheral vascular disease ([facility])     Comment:  AAA as stated per pt / was just discovered and pt states              has not been referred to vascular MD  No date: Restless leg     Comment:  takes Requip daily No date: Rosacea No date: Varicose veins   Reproductive/Obstetrics                             Anesthesia Physical Anesthesia Plan  ASA: III  Anesthesia Plan: General   Post-op Pain Management:    Induction: Intravenous  PONV Risk Score and Plan: 2 and Propofol infusion  Airway Management Planned: Natural Airway  Additional Equipment:   Intra-op Plan:   Post-operative Plan:   Informed Consent: I have reviewed the patients History and Physical, chart, labs and discussed the  procedure including the risks, benefits and alternatives for the proposed anesthesia with the patient or authorized representative who has indicated his/her understanding and acceptance.     Dental advisory given  Plan Discussed with: CRNA and Anesthesiologist  Anesthesia Plan Comments:         Anesthesia Quick Evaluation

## 2018-11-26 ENCOUNTER — Encounter: Payer: Self-pay | Admitting: Internal Medicine

## 2018-11-26 LAB — SURGICAL PATHOLOGY

## 2018-12-04 ENCOUNTER — Other Ambulatory Visit: Payer: Self-pay | Admitting: Internal Medicine

## 2018-12-05 NOTE — Telephone Encounter (Signed)
This is Dr. Gollan's pt. °

## 2018-12-10 DIAGNOSIS — D369 Benign neoplasm, unspecified site: Secondary | ICD-10-CM | POA: Insufficient documentation

## 2018-12-21 LAB — CUP PACEART REMOTE DEVICE CHECK
Date Time Interrogation Session: 20201107194153
Implantable Pulse Generator Implant Date: 20190905

## 2018-12-22 ENCOUNTER — Ambulatory Visit (INDEPENDENT_AMBULATORY_CARE_PROVIDER_SITE_OTHER): Payer: Medicare Other | Admitting: *Deleted

## 2018-12-22 DIAGNOSIS — I5032 Chronic diastolic (congestive) heart failure: Secondary | ICD-10-CM

## 2018-12-22 DIAGNOSIS — I471 Supraventricular tachycardia: Secondary | ICD-10-CM

## 2018-12-26 ENCOUNTER — Other Ambulatory Visit: Payer: Self-pay | Admitting: Specialist

## 2018-12-26 DIAGNOSIS — R0609 Other forms of dyspnea: Secondary | ICD-10-CM

## 2018-12-26 DIAGNOSIS — J986 Disorders of diaphragm: Secondary | ICD-10-CM

## 2019-01-06 ENCOUNTER — Ambulatory Visit
Admission: RE | Admit: 2019-01-06 | Discharge: 2019-01-06 | Disposition: A | Discharge status: Home / Self Care | Payer: Medicare Other | Source: Ambulatory Visit | Attending: Specialist | Admitting: Specialist

## 2019-01-06 ENCOUNTER — Other Ambulatory Visit: Payer: Self-pay

## 2019-01-06 DIAGNOSIS — J986 Disorders of diaphragm: Secondary | ICD-10-CM | POA: Diagnosis present

## 2019-01-06 DIAGNOSIS — R0609 Other forms of dyspnea: Secondary | ICD-10-CM

## 2019-01-06 DIAGNOSIS — R0600 Dyspnea, unspecified: Secondary | ICD-10-CM | POA: Insufficient documentation

## 2019-01-20 NOTE — Progress Notes (Signed)
Carelink Summary Report / Loop Recorder 

## 2019-01-22 ENCOUNTER — Ambulatory Visit (INDEPENDENT_AMBULATORY_CARE_PROVIDER_SITE_OTHER): Payer: Medicare Other | Admitting: *Deleted

## 2019-01-22 DIAGNOSIS — R55 Syncope and collapse: Secondary | ICD-10-CM

## 2019-01-22 LAB — CUP PACEART REMOTE DEVICE CHECK
Date Time Interrogation Session: 20201210144502
Implantable Pulse Generator Implant Date: 20190905

## 2019-01-27 ENCOUNTER — Ambulatory Visit: Payer: Medicare Other | Admitting: Family

## 2019-02-07 NOTE — Progress Notes (Signed)
Patient ID: [name], female    DOB: Nov 26, 1945, 73 y.o.   MRN: [id number]  HPI  [name] is a 73 y/o female with a history of anxiety, CAD, depression, DM, GERD, gout, hyperlipidemia, HTN, hypothyroidism, obstructive sleep apnea (CPAP), PVD, NSVT and chronic heart failure.   Echo report from 07/17/17 reviewed and showed an EF of 60-65%. Reviewed echo report from 07/26/16 which showed an EF of 60-65%.  Cardiac catheterization done 10/01/17 and showed:  1: Moderate to severe single-vessel coronary artery disease with 50% proximal/mid LAD disease as well as 80% ostial D1 stenosis.  Both the LAD and diagonal branches are relatively small.    2: Large, dominant left circumflex and small nondominant right coronary artery without significant coronary artery disease.   3: Moderately elevated left ventricular filling pressure, consistent with diastolic heart failure.  Has not been admitted or been in the ED in the last 6 months.   She presents today for a follow-up visit with a chief complaint of moderate shortness of breath upon minimal exertion. She describes this as chronic in nature having been present for several years. She has associated fatigue, wheezing, pedal edema, palpitations, chronic nausea, chronic worsening back pain, light-headedness and slight weight fluctuation. She denies any difficulty sleeping, abdominal distention, chest pain or cough.   Edema improves with the use of lymphapress compress boots but then returns. Admits that she's limited in her ability to exercise due to her worsening lower back pain. She says that she may need another fusion in the future. She is concerned about her renal disease and occasionally doesn't take both of her torsemide but then the swelling is worse.   Past Medical History:  Diagnosis Date  . Anxiety   . Arthritis   . CHF (congestive heart failure) ([place])   . Chronic back pain    stenosis.degenerative disc,some scoliosis  . Constipation     takes Stool Softener daily  . Coronary artery disease   . Depression    takes Cymbalta daily  . Diabetes mellitus    Type 2 diabetic. Average fasting blood sugar runs high 170-200  . Diastolic CHF ([place]) [id number]   Per patient, diagnosed in 2018  . Diverticulosis   . E coli infection   . GERD (gastroesophageal reflux disease)    takes Nexium daily  . Headache   . Hemorrhoids   . History of colon polyps    benign  . History of gout    doesn't take any meds  . History of hiatal hernia   . History of vertigo    doesn't take any meds  . Hyperlipidemia    takes Praluent daily  . Hypertension    currently BP medications are on hold   . Hypothyroidism    takes Synthroid daily  . Insomnia    takes Restoril nightly  . Muscle spasm    takes Robaxin as needed  . NSVT (nonsustained ventricular tachycardia) ([place])   . OSA on CPAP   . Peripheral vascular disease ([place])    AAA as stated per pt / was just discovered and pt states has not been referred to vascular MD   . Restless leg    takes Requip daily  . Rosacea   . Varicose veins    Past Surgical History:  Procedure Laterality Date  . ABDOMINAL HYSTERECTOMY     with BSo  . CARDIAC CATHETERIZATION  2013   Normal  . CARPAL TUNNEL RELEASE Bilateral   . CATARACT  EXTRACTION, BILATERAL    . CHOLECYSTECTOMY    . COLONOSCOPY    . COLONOSCOPY WITH PROPOFOL N/A 11/25/2018   Procedure: COLONOSCOPY WITH PROPOFOL;  Surgeon: Toledo, Benay Pike, MD;  Location: [facility] ENDOSCOPY;  Service: Gastroenterology;  Laterality: N/A;  . DIAGNOSTIC LAPAROSCOPY     multiple times  . DILATION AND CURETTAGE OF UTERUS    . ESOPHAGOGASTRODUODENOSCOPY (EGD) WITH PROPOFOL N/A 11/01/2014   Procedure: ESOPHAGOGASTRODUODENOSCOPY (EGD) WITH PROPOFOL;  Surgeon: Hulen Luster, MD;  Location: [facility] ENDOSCOPY;  Service: Gastroenterology;  Laterality: N/A;  . ESOPHAGOGASTRODUODENOSCOPY (EGD) WITH PROPOFOL N/A 11/25/2018   Procedure: ESOPHAGOGASTRODUODENOSCOPY (EGD) WITH  PROPOFOL;  Surgeon: Toledo, Benay Pike, MD;  Location: [facility] ENDOSCOPY;  Service: Gastroenterology;  Laterality: N/A;  . HARDWARE REMOVAL Left 10/11/2016   Procedure: HARDWARE REMOVAL-LEFT RADIUS;  Surgeon: Lovell Sheehan, MD;  Location: [facility] ORS;  Service: Orthopedics;  Laterality: Left;  Left Radius Wrist   . IMPLANTABLE CONTACT LENS IMPLANTATION     bilateral  . LAMINECTOMY  11/13/2015  . LEFT HEART CATH AND CORONARY ANGIOGRAPHY Left 10/01/2017   Procedure: LEFT HEART CATH AND CORONARY ANGIOGRAPHY;  Surgeon: Nelva Bush, MD;  Location: [place] [facility];  Service: Cardiovascular;  Laterality: Left;  . LOOP RECORDER INSERTION N/A 10/17/2017   Procedure: LOOP RECORDER INSERTION;  Surgeon: Deboraha Sprang, MD;  Location: [place] [facility];  Service: Cardiovascular;  Laterality: N/A;  . LUMBAR FUSION  11/2015  . LUMBAR WOUND DEBRIDEMENT N/A 12/02/2015   Procedure: WOUND Exploration;  Surgeon: Consuella Lose, MD;  Location: [place];  Service: Neurosurgery;  Laterality: N/A;  . OPEN REDUCTION INTERNAL FIXATION (ORIF) DISTAL RADIAL FRACTURE Left 08/29/2016   Procedure: OPEN REDUCTION INTERNAL FIXATION (ORIF) DISTAL RADIAL FRACTURE;  Surgeon: Lovell Sheehan, MD;  Location: [facility] ORS;  Service: Orthopedics;  Laterality: Left;  . PICC LINE PLACE PERIPHERAL ([place] [facility])     right upper arm   . ROTATOR CUFF REPAIR Left   . SAVORY DILATION N/A 11/01/2014   Procedure: SAVORY DILATION;  Surgeon: Hulen Luster, MD;  Location: [facility] ENDOSCOPY;  Service: Gastroenterology;  Laterality: N/A;  . TONSILLECTOMY    . TRIGGER FINGER RELEASE Bilateral    Family History  Problem Relation Age of Onset  . Heart disease Mother   . Breast cancer Mother 28  . Heart disease Father   . Heart attack Sister   . Heart disease Sister   . Heart disease Brother    Social History   Tobacco Use  . Smoking status: Never Smoker  . Smokeless tobacco: Never Used  Substance Use Topics  . Alcohol use: No   Allergies   Allergen Reactions  . Darvon [Propoxyphene]   . Reglan [Metoclopramide] Diarrhea  . Statins   . Tramadol Hives  . Zetia [Ezetimibe] Diarrhea  . Ceftin [Cefuroxime Axetil] Diarrhea  . Codeine Rash       . Penicillins Rash and Other (See Comments)    Has patient had a PCN reaction causing immediate rash, facial/tongue/throat swelling, SOB or lightheadedness with hypotension: NO Has patient had a PCN reaction causing severe rash involving mucus membranes or skin necrosis: NO Has patient had a PCN retioion that required hospitalization NO Has patient had a PCN reaction occurring within the last 10 years: NO If all of the above answers are "NO", then may proceed with Cephalosporin use.  . Vicodin [Hydrocodone-Acetaminophen] Other (See Comments)    passes out   Prior to Admission medications   Medication Sig Start Date  End Date Taking? Authorizing Provider  albuterol (PROVENTIL HFA;VENTOLIN HFA) 108 (90 Base) MCG/ACT inhaler Inhale 2 puffs into the lungs every 6 (six) hours as needed for wheezing or shortness of breath. 01/09/17  Yes Helane Briceno A, FNP  Alirocumab (PRALUENT) 150 MG/ML SOAJ Inject 1 pen into the skin every 14 (fourteen) days. 09/11/18  Yes Minna Merritts, MD  aspirin 81 MG tablet Take 81 mg by mouth at bedtime.    Yes [provider]  atenolol (TENORMIN) 25 MG tablet Take 0.5 tablets (12.5 mg total) by mouth daily. 01/06/18  Yes Minna Merritts, MD  Azelaic Acid (FINACEA) 15 % FOAM Apply topically as needed.   Yes [provider]  azelastine (ASTELIN) 0.1 % nasal spray Place 2 sprays into both nostrils at bedtime as needed for rhinitis.    Yes [provider]  Cholecalciferol (VITAMIN D) 2000 units tablet Take 2,000 Units by mouth daily.   Yes [provider]  ciprofloxacin (CIPRO) 250 MG/5ML (5%) SUSR Take by mouth.   Yes [provider]  ciprofloxacin (CIPRO) 500 MG/5ML (10%) suspension Take 500 mg by mouth 2 (two) times  daily.   Yes [provider]  Docusate Calcium (STOOL SOFTENER PO) Take by mouth.   Yes [provider]  esomeprazole (NEXIUM) 20 MG capsule Take 20 mg by mouth daily.   Yes [provider]  gabapentin (NEURONTIN) 300 MG capsule Take 300 mg by mouth 2 (two) times daily.   Yes [provider]  insulin aspart (NOVOLOG) 100 UNIT/ML FlexPen Inject 10-16 Units into the skin See admin instructions. 10 units with breakfast, 10 units with lunch, 16 units at dinner, >200 increase by 2 units. 03/01/15 02/09/19 Yes [provider]  insulin glargine (LANTUS) 100 UNIT/ML injection Inject 30 Units into the skin every morning.    Yes [provider]  levothyroxine (SYNTHROID, LEVOTHROID) 75 MCG tablet Take 75 mcg by mouth daily before breakfast.  11/24/12  Yes [provider]  methocarbamol (ROBAXIN) 750 MG tablet Take 750 mg by mouth as needed for muscle spasms.   Yes [provider]  metroNIDAZOLE (FLAGYL) 500 MG tablet Take 500 mg by mouth 3 (three) times daily.   Yes [provider]  ondansetron (ZOFRAN) 4 MG tablet Take 4 mg by mouth every 8 (eight) hours as needed for nausea or vomiting.  07/19/17  Yes [provider]  potassium chloride SA (K-DUR,KLOR-CON) 20 MEQ tablet Take 2 tablets (40 mEq total) by mouth daily. 01/06/18  Yes Minna Merritts, MD  ranolazine (RANEXA) 1000 MG SR tablet TAKE 1 TABLET TWICE A DAY 07/02/18  Yes Gollan, Kathlene November, MD  rOPINIRole (REQUIP) 2 MG tablet Take 2 mg by mouth at bedtime.   Yes [provider]  temazepam (RESTORIL) 30 MG capsule Take 30 mg by mouth at bedtime as needed for sleep.   Yes [provider]  torsemide (DEMADEX) 20 MG tablet Take 40 mg by mouth daily.    Yes [provider]  traMADol (ULTRAM) 50 MG tablet Take 50 mg by mouth daily as needed for moderate pain or severe pain.    Yes [provider]  ipratropium-albuterol (DUONEB) 0.5-2.5  (3) MG/3ML SOLN Take 3 mLs by nebulization every 6 (six) hours as needed. Patient not taking: Reported on 02/09/2019 07/27/17   Bettey Costa, MD  nitroGLYCERIN (NITROSTAT) 0.4 MG SL tablet Place 1 tablet (0.4 mg total) under the tongue every 5 (five) minutes as needed for chest  pain. Patient not taking: Reported on 11/25/2018 06/18/18 12/31/19  Minna Merritts, MD     Review of Systems  Constitutional: Positive for fatigue. Negative for appetite change.  HENT: Negative for congestion, postnasal drip and sore throat.   Eyes: Negative.   Respiratory: Positive for shortness of breath and wheezing. Negative for cough and chest tightness.   Cardiovascular: Positive for palpitations and leg swelling. Negative for chest pain.  Gastrointestinal: Positive for nausea. Negative for abdominal distention and abdominal pain.  Endocrine: Negative.   Genitourinary: Negative.   Musculoskeletal: Positive for back pain (lower back). Negative for neck pain.  Skin: Negative.   Allergic/Immunologic: Negative.   Neurological: Positive for light-headedness (with low blood sugar). Negative for dizziness.  Hematological: Negative for adenopathy. Bruises/bleeds easily.  Psychiatric/Behavioral: Negative for dysphoric mood and sleep disturbance (sleeping with CPAP). The patient is not nervous/anxious.    Vitals:   02/09/19 1037  BP: (!) 144/70  Pulse: 83  Resp: 16  SpO2: 99%  Weight: 218 lb (98.9 kg)  Height: 5\' 1"  (1.549 m)   Wt Readings from Last 3 Encounters:  02/09/19 218 lb (98.9 kg)  11/25/18 214 lb (97.1 kg)  08/14/18 210 lb 12.8 oz (95.6 kg)   Lab Results  Component Value Date   CREATININE 1.30 (H) 05/16/2018   CREATININE 1.32 (H) 02/22/2018   CREATININE 1.30 (H) 10/09/2017    Physical Exam  Constitutional: She is oriented to person, place, and time. She appears well-developed and well-nourished.  HENT:  Head: Normocephalic and atraumatic.  Neck: No JVD present.  Cardiovascular: Normal rate  and regular rhythm.  Pulmonary/Chest: Effort normal. She has no wheezes. She has no rales.  Abdominal: Soft. She exhibits no distension. There is no abdominal tenderness.  Musculoskeletal:        General: Edema (trace pitting edema around both ankles) present. No tenderness.     Cervical back: Normal range of motion and neck supple.  Neurological: She is alert and oriented to person, place, and time.  Skin: Skin is warm and dry.  Psychiatric: She has a normal mood and affect. Her behavior is normal. Thought content normal.  Nursing note and vitals reviewed.  Assessment & Plan:  1: Chronic heart failure with preserved ejection fraction- - NYHA class III - euvolemic today - weighing daily; Reminded to call for an overnight weight gain of >2 pounds or a weekly weight gain of >5 pounds - uses lite salt; Discussed the importance of closely following a 2000mg  sodium diet and not adding any salt to her food - had telemedicine visit with cardiologist Rockey Situ) 06/18/2018 - had telemedicine visit with EP Caryl Comes) 06/16/2018; does have loop recorder in place - maintain fluid intake to 40-60 ounces daily - saw pulmonologist Raul Del) 12/26/2018 - reports receiving her flu vaccine for this season  2: HTN- - BP looks good today - saw PCP Sabra Heck) 12/10/2018 - BMP on 10/23/2018 [facility]) reviewed and showed sodium 144, potassium 3.7, creatinine 1.4 and GFR 37   3: Obstructive sleep apnea- - wearing CPAP nightly  4: Lymphedema- - does elevate her legs but edema persists - unable to wear compression socks as she's unable to bend over very far due to hardware in her back - wearing compression boots twice daily with good reduction in edema - limited in exercise due to her shortness of breath & back surgery  5: Diabetes- - fasting glucose at home this morning was 113 - A1c on 07/27/17 was 5.8%  Patient did not  bring her medications nor a list. Each medication was verbally reviewed with the  patient and she was encouraged to bring the bottles to every visit to confirm accuracy of list.  Will not make a return appointment for patient at this time. Advised patient that she could call back at anytime to schedule another appointment. Patient was comfortable with this plan.

## 2019-02-09 ENCOUNTER — Other Ambulatory Visit: Payer: Self-pay

## 2019-02-09 ENCOUNTER — Ambulatory Visit: Payer: Medicare Other | Attending: Family | Admitting: Family

## 2019-02-09 ENCOUNTER — Encounter: Payer: Self-pay | Admitting: Family

## 2019-02-09 VITALS — BP 144/70 | HR 83 | Resp 16 | Ht 61.0 in | Wt 218.0 lb

## 2019-02-09 DIAGNOSIS — E1151 Type 2 diabetes mellitus with diabetic peripheral angiopathy without gangrene: Secondary | ICD-10-CM | POA: Insufficient documentation

## 2019-02-09 DIAGNOSIS — F419 Anxiety disorder, unspecified: Secondary | ICD-10-CM | POA: Insufficient documentation

## 2019-02-09 DIAGNOSIS — M1990 Unspecified osteoarthritis, unspecified site: Secondary | ICD-10-CM | POA: Diagnosis not present

## 2019-02-09 DIAGNOSIS — I10 Essential (primary) hypertension: Secondary | ICD-10-CM

## 2019-02-09 DIAGNOSIS — R110 Nausea: Secondary | ICD-10-CM | POA: Insufficient documentation

## 2019-02-09 DIAGNOSIS — G4700 Insomnia, unspecified: Secondary | ICD-10-CM | POA: Diagnosis not present

## 2019-02-09 DIAGNOSIS — I2510 Atherosclerotic heart disease of native coronary artery without angina pectoris: Secondary | ICD-10-CM | POA: Insufficient documentation

## 2019-02-09 DIAGNOSIS — F329 Major depressive disorder, single episode, unspecified: Secondary | ICD-10-CM | POA: Diagnosis not present

## 2019-02-09 DIAGNOSIS — M545 Low back pain: Secondary | ICD-10-CM | POA: Insufficient documentation

## 2019-02-09 DIAGNOSIS — I5032 Chronic diastolic (congestive) heart failure: Secondary | ICD-10-CM | POA: Diagnosis not present

## 2019-02-09 DIAGNOSIS — E039 Hypothyroidism, unspecified: Secondary | ICD-10-CM | POA: Diagnosis not present

## 2019-02-09 DIAGNOSIS — M109 Gout, unspecified: Secondary | ICD-10-CM | POA: Diagnosis not present

## 2019-02-09 DIAGNOSIS — I890 Lymphedema, not elsewhere classified: Secondary | ICD-10-CM | POA: Diagnosis not present

## 2019-02-09 DIAGNOSIS — Z803 Family history of malignant neoplasm of breast: Secondary | ICD-10-CM | POA: Insufficient documentation

## 2019-02-09 DIAGNOSIS — Z79899 Other long term (current) drug therapy: Secondary | ICD-10-CM | POA: Diagnosis not present

## 2019-02-09 DIAGNOSIS — I110 Hypertensive heart disease with heart failure: Secondary | ICD-10-CM | POA: Insufficient documentation

## 2019-02-09 DIAGNOSIS — Z888 Allergy status to other drugs, medicaments and biological substances status: Secondary | ICD-10-CM | POA: Diagnosis not present

## 2019-02-09 DIAGNOSIS — Z90722 Acquired absence of ovaries, bilateral: Secondary | ICD-10-CM | POA: Insufficient documentation

## 2019-02-09 DIAGNOSIS — R0602 Shortness of breath: Secondary | ICD-10-CM | POA: Diagnosis present

## 2019-02-09 DIAGNOSIS — Z7982 Long term (current) use of aspirin: Secondary | ICD-10-CM | POA: Diagnosis not present

## 2019-02-09 DIAGNOSIS — E785 Hyperlipidemia, unspecified: Secondary | ICD-10-CM | POA: Diagnosis not present

## 2019-02-09 DIAGNOSIS — G2581 Restless legs syndrome: Secondary | ICD-10-CM | POA: Insufficient documentation

## 2019-02-09 DIAGNOSIS — Z881 Allergy status to other antibiotic agents status: Secondary | ICD-10-CM | POA: Insufficient documentation

## 2019-02-09 DIAGNOSIS — Z794 Long term (current) use of insulin: Secondary | ICD-10-CM | POA: Insufficient documentation

## 2019-02-09 DIAGNOSIS — R062 Wheezing: Secondary | ICD-10-CM | POA: Insufficient documentation

## 2019-02-09 DIAGNOSIS — I714 Abdominal aortic aneurysm, without rupture: Secondary | ICD-10-CM | POA: Diagnosis not present

## 2019-02-09 DIAGNOSIS — Z880 Allergy status to penicillin: Secondary | ICD-10-CM | POA: Insufficient documentation

## 2019-02-09 DIAGNOSIS — K219 Gastro-esophageal reflux disease without esophagitis: Secondary | ICD-10-CM | POA: Insufficient documentation

## 2019-02-09 DIAGNOSIS — G4733 Obstructive sleep apnea (adult) (pediatric): Secondary | ICD-10-CM | POA: Insufficient documentation

## 2019-02-09 DIAGNOSIS — Z90710 Acquired absence of both cervix and uterus: Secondary | ICD-10-CM | POA: Insufficient documentation

## 2019-02-09 DIAGNOSIS — N1832 Chronic kidney disease, stage 3b: Secondary | ICD-10-CM

## 2019-02-09 DIAGNOSIS — Z8249 Family history of ischemic heart disease and other diseases of the circulatory system: Secondary | ICD-10-CM | POA: Insufficient documentation

## 2019-02-09 DIAGNOSIS — Z885 Allergy status to narcotic agent status: Secondary | ICD-10-CM | POA: Insufficient documentation

## 2019-02-09 DIAGNOSIS — Z9049 Acquired absence of other specified parts of digestive tract: Secondary | ICD-10-CM | POA: Insufficient documentation

## 2019-02-09 NOTE — Patient Instructions (Addendum)
Continue weighing daily and call for an overnight weight gain of > 2 pounds or a weekly weight gain of >5 pounds.  Call us back to schedule an appointment at anytime in the future

## 2019-02-24 ENCOUNTER — Telehealth: Payer: Self-pay

## 2019-02-24 ENCOUNTER — Telehealth (INDEPENDENT_AMBULATORY_CARE_PROVIDER_SITE_OTHER): Payer: Medicare Other | Admitting: Internal Medicine

## 2019-02-24 ENCOUNTER — Ambulatory Visit (INDEPENDENT_AMBULATORY_CARE_PROVIDER_SITE_OTHER): Payer: Medicare Other | Admitting: *Deleted

## 2019-02-24 ENCOUNTER — Other Ambulatory Visit: Payer: Self-pay

## 2019-02-24 VITALS — BP 127/64 | HR 74 | Ht 61.5 in | Wt 218.0 lb

## 2019-02-24 DIAGNOSIS — Z6838 Body mass index (BMI) 38.0-38.9, adult: Secondary | ICD-10-CM | POA: Insufficient documentation

## 2019-02-24 DIAGNOSIS — M4316 Spondylolisthesis, lumbar region: Secondary | ICD-10-CM | POA: Insufficient documentation

## 2019-02-24 DIAGNOSIS — R55 Syncope and collapse: Secondary | ICD-10-CM

## 2019-02-24 NOTE — Telephone Encounter (Signed)
The pt states she had a fainting spell last night. She used her symptom activator and sent in a transmission. The pt did loose consciousness and when she woke up she did not know what had happen. I told the pt I will have the nurse to give her a call back.

## 2019-02-24 NOTE — H&P (View-Only) (Signed)
Electrophysiology TeleHealth Note   Due to national recommendations of social distancing due to COVID 19, an audio/video telehealth visit is felt to be most appropriate for this patient at this time.  See MyChart message from today for the patient's consent to telehealth for [name]   Date:  02/24/2019   ID:  TENNILLE Castaneda, DOB 1945-08-27, MRN [id number]  Location: patient's home  Provider location: [address] [place]  Evaluation Performed: Follow-up visit  PCP:  Rusty Aus, MD  Cardiologist:  TG Electrophysiologist:  SK   Chief Complaint:  Syncope   History of Present Illness:    [name] is a 74 y.o. female who presents via audio/video conferencing for a telehealth visit today.  Since last being seen in our clinic for syncope with an implanted loop recorder  the patient reports interval syncope  Was in bed, urge to go BM Went to bathroom sat on commode, typical prodrome but very aburpt and LOC ; no olfactory  Different from previous spells, without nausea.  Recovery symptoms-- different -- disoriented.     DATE TEST EF   6/19 Echo   55-65 % PA pressure normal  7/19  CTA  FFR < .8 OM and not able to calculate LAD  8/19 LHC  50 %LAD mid/80%D1        The patient denies symptoms of fevers, chills, cough, or new SOB worrisome for COVID 19.    Past Medical History:  Diagnosis Date  . Anxiety   . Arthritis   . CHF (congestive heart failure) ([place])   . Chronic back pain    stenosis.degenerative disc,some scoliosis  . Constipation    takes Stool Softener daily  . Coronary artery disease   . Depression    takes Cymbalta daily  . Diabetes mellitus    Type 2 diabetic. Average fasting blood sugar runs high 170-200  . Diastolic CHF ([place]) 123456   Per patient, diagnosed in 2018  . Diverticulosis   . E coli infection   . GERD (gastroesophageal reflux disease)    takes Nexium daily  . Headache   . Hemorrhoids   . History of  colon polyps    benign  . History of gout    doesn't take any meds  . History of hiatal hernia   . History of vertigo    doesn't take any meds  . Hyperlipidemia    takes Praluent daily  . Hypertension    currently BP medications are on hold   . Hypothyroidism    takes Synthroid daily  . Insomnia    takes Restoril nightly  . Muscle spasm    takes Robaxin as needed  . NSVT (nonsustained ventricular tachycardia) ([place])   . OSA on CPAP   . Peripheral vascular disease ([place])    AAA as stated per pt / was just discovered and pt states has not been referred to vascular MD   . Restless leg    takes Requip daily  . Rosacea   . Varicose veins     Past Surgical History:  Procedure Laterality Date  . ABDOMINAL HYSTERECTOMY     with BSo  . CARDIAC CATHETERIZATION  2013   Normal  . CARPAL TUNNEL RELEASE Bilateral   . CATARACT EXTRACTION, BILATERAL    . CHOLECYSTECTOMY    . COLONOSCOPY    . COLONOSCOPY WITH PROPOFOL N/A 11/25/2018   Procedure: COLONOSCOPY WITH PROPOFOL;  Surgeon: Alice Reichert, Benay Pike, MD;  Location: [facility] ENDOSCOPY;  Service: Gastroenterology;  Laterality: N/A;  . DIAGNOSTIC LAPAROSCOPY     multiple times  . DILATION AND CURETTAGE OF UTERUS    . ESOPHAGOGASTRODUODENOSCOPY (EGD) WITH PROPOFOL N/A 11/01/2014   Procedure: ESOPHAGOGASTRODUODENOSCOPY (EGD) WITH PROPOFOL;  Surgeon: Hulen Luster, MD;  Location: [facility] ENDOSCOPY;  Service: Gastroenterology;  Laterality: N/A;  . ESOPHAGOGASTRODUODENOSCOPY (EGD) WITH PROPOFOL N/A 11/25/2018   Procedure: ESOPHAGOGASTRODUODENOSCOPY (EGD) WITH PROPOFOL;  Surgeon: Toledo, Benay Pike, MD;  Location: [facility] ENDOSCOPY;  Service: Gastroenterology;  Laterality: N/A;  . HARDWARE REMOVAL Left 10/11/2016   Procedure: HARDWARE REMOVAL-LEFT RADIUS;  Surgeon: Lovell Sheehan, MD;  Location: [facility] ORS;  Service: Orthopedics;  Laterality: Left;  Left Radius Wrist   . IMPLANTABLE CONTACT LENS IMPLANTATION     bilateral  . LAMINECTOMY  11/13/2015  . LEFT  HEART CATH AND CORONARY ANGIOGRAPHY Left 10/01/2017   Procedure: LEFT HEART CATH AND CORONARY ANGIOGRAPHY;  Surgeon: Nelva Bush, MD;  Location: [place] CV LAB;  Service: Cardiovascular;  Laterality: Left;  . LOOP RECORDER INSERTION N/A 10/17/2017   Procedure: LOOP RECORDER INSERTION;  Surgeon: Deboraha Sprang, MD;  Location: [place] CV LAB;  Service: Cardiovascular;  Laterality: N/A;  . LUMBAR FUSION  11/2015  . LUMBAR WOUND DEBRIDEMENT N/A 12/02/2015   Procedure: WOUND Exploration;  Surgeon: Consuella Lose, MD;  Location: [place];  Service: Neurosurgery;  Laterality: N/A;  . OPEN REDUCTION INTERNAL FIXATION (ORIF) DISTAL RADIAL FRACTURE Left 08/29/2016   Procedure: OPEN REDUCTION INTERNAL FIXATION (ORIF) DISTAL RADIAL FRACTURE;  Surgeon: Lovell Sheehan, MD;  Location: [facility] ORS;  Service: Orthopedics;  Laterality: Left;  . PICC LINE PLACE PERIPHERAL ([place] HX)     right upper arm   . ROTATOR CUFF REPAIR Left   . SAVORY DILATION N/A 11/01/2014   Procedure: SAVORY DILATION;  Surgeon: Hulen Luster, MD;  Location: [facility] ENDOSCOPY;  Service: Gastroenterology;  Laterality: N/A;  . TONSILLECTOMY    . TRIGGER FINGER RELEASE Bilateral     Current Outpatient Medications  Medication Sig Dispense Refill  . albuterol (PROVENTIL HFA;VENTOLIN HFA) 108 (90 Base) MCG/ACT inhaler Inhale 2 puffs into the lungs every 6 (six) hours as needed for wheezing or shortness of breath. 3 Inhaler 3  . Alirocumab (PRALUENT) 150 MG/ML SOAJ Inject 1 pen into the skin every 14 (fourteen) days. 6 pen 3  . aspirin 81 MG tablet Take 81 mg by mouth at bedtime.     [name] atenolol (TENORMIN) 25 MG tablet Take 0.5 tablets (12.5 mg total) by mouth daily. 45 tablet 4  . Azelaic Acid (FINACEA) 15 % FOAM Apply topically as needed.    [name] azelastine (ASTELIN) 0.1 % nasal spray Place 2 sprays into both nostrils at bedtime as needed for rhinitis.     . Cholecalciferol (VITAMIN D) 2000 units tablet Take 2,000 Units by mouth daily.      . ciprofloxacin (CIPRO) 250 MG/5ML (5%) SUSR Take by mouth.    . ciprofloxacin (CIPRO) 500 MG/5ML (10%) suspension Take 500 mg by mouth 2 (two) times daily.    Mariane Baumgarten Calcium (STOOL SOFTENER PO) Take by mouth.    . esomeprazole (NEXIUM) 20 MG capsule Take 20 mg by mouth daily.    [name] gabapentin (NEURONTIN) 300 MG capsule Take 300 mg by mouth 2 (two) times daily.    . insulin glargine (LANTUS) 100 UNIT/ML injection Inject 30 Units into the skin every morning.     [name] ipratropium-albuterol (DUONEB) 0.5-2.5 (3) MG/3ML SOLN Take 3  mLs by nebulization every 6 (six) hours as needed.    [name] levothyroxine (SYNTHROID, LEVOTHROID) 75 MCG tablet Take 75 mcg by mouth daily before breakfast.     . methocarbamol (ROBAXIN) 750 MG tablet Take 750 mg by mouth as needed for muscle spasms.    . metroNIDAZOLE (FLAGYL) 500 MG tablet Take 500 mg by mouth 3 (three) times daily.    . nitroGLYCERIN (NITROSTAT) 0.4 MG SL tablet Place 1 tablet (0.4 mg total) under the tongue every 5 (five) minutes as needed for chest pain. 25 tablet 3  . ondansetron (ZOFRAN) 4 MG tablet Take 4 mg by mouth every 8 (eight) hours as needed for nausea or vomiting.     . potassium chloride SA (K-DUR,KLOR-CON) 20 MEQ tablet Take 2 tablets (40 mEq total) by mouth daily. 180 tablet 3  . ranolazine (RANEXA) 1000 MG SR tablet TAKE 1 TABLET TWICE A DAY 180 tablet 2  . rOPINIRole (REQUIP) 2 MG tablet Take 2 mg by mouth at bedtime.    . temazepam (RESTORIL) 30 MG capsule Take 30 mg by mouth at bedtime as needed for sleep.    [name] torsemide (DEMADEX) 20 MG tablet Take 40 mg by mouth daily.     . traMADol (ULTRAM) 50 MG tablet Take 50 mg by mouth daily as needed for moderate pain or severe pain.     [name] insulin aspart (NOVOLOG) 100 UNIT/ML FlexPen Inject 10-16 Units into the skin See admin instructions. 10 units with breakfast, 10 units with lunch, 16 units at dinner, >200 increase by 2 units.     No current facility-administered medications for this visit.    Facility-Administered Medications Ordered in Other Visits  Medication Dose Route Frequency Provider Last Rate Last Admin  . dexamethasone (DECADRON) injection 10 mg  10 mg Intravenous Once Lovell Sheehan, MD        Allergies:   Darvon [propoxyphene], Reglan [metoclopramide], Statins, Tramadol, Zetia [ezetimibe], Ceftin [cefuroxime axetil], Codeine, Penicillins, and Vicodin [hydrocodone-acetaminophen]   Social History:  The patient  reports that she has never smoked. She has never used smokeless tobacco. She reports that she does not drink alcohol or use drugs.   Family History:  The patient's   family history includes Breast cancer (age of onset: 20) in her mother; Heart attack in her sister; Heart disease in her brother, father, mother, and sister.   ROS:  Please see the history of present illness.   All other systems are personally reviewed and negative.    Exam:    Vital Signs:  BP 127/64   Pulse 74   Ht 5' 1.5" (1.562 m)   Wt 218 lb (98.9 kg)   BMI 40.52 kg/m        Labs/Other Tests and Data Reviewed:    Recent Labs: 05/16/2018: BUN 21; Creatinine, Ser 1.30; Hemoglobin 12.6; Platelets 281; Potassium 3.2; Sodium 141   Wt Readings from Last 3 Encounters:  02/24/19 218 lb (98.9 kg)  02/09/19 218 lb (98.9 kg)  11/25/18 214 lb (97.1 kg)     Other studies personally reviewed: Additional studies/ records that were reviewed today include reviewing the tracings above  Event recorder personally reviewed.  30 seconds of a heart rate less than 30.  Pauses of greater than 13 seconds.  ASSESSMENT & PLAN:   Syncope and exertional lightheadedness  Atrial tachycardia-nonsustained  Obstructive sleep apnea treated   Orthostatic lightheadedness  Obesity  Diastolic heart failure  Recurrent nausea/vomiting  Coronary artery disease with abnormal FFR  Patient had syncope with prolonged bradycardia more than 30 seconds.  There is at least 13 seconds of asystole with  occasional scattered PVCs.  Heart rate slowing is part of the development of her syncope accompanied by her stereotypical prodrome.  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  Given the prolonged nature of the pauses and the paucity of warning, it is reasonable to undertake pacing for reduction of risk of recurrent syncope.  Would use Biotronik CLS.  The benefits and risks were reviewed including but not limited to death,  perforation, infection, lead dislodgement and device malfunction.  The patient understands agrees and is willing to proceed.  She is aware that she is not to drive for 6 months COVID 19 screen The patient denies symptoms of COVID 19 at this time.  The importance of social distancing was discussed today.  Follow-up: Post pacer    Current medicines are reviewed at length with the patient today.   The patient does not have concerns regarding her medicines.  The following changes were made today:  none  Labs/ tests ordered today include:   No orders of the defined types were placed in this encounter.   Future tests ( post COVID )     Patient Risk:  after full review of this patients clinical status, I feel that they are at moderate  risk at this time.  Today, I have spent17 already marginate* minutes with the patient with telehealth technology discussing the above.  Signed, Virl Axe, MD  02/24/2019 5:48 PM     Susan Moore [address] [place] [address] [phone] (office) [phone] (fax)

## 2019-02-24 NOTE — Progress Notes (Signed)
ILR remote 

## 2019-02-24 NOTE — Telephone Encounter (Signed)
Spoke with patient. She had an episode last night that was not similar in her mind to previous episodes. She was laying in bed and felt like she needed to use the bathroom. She walked to the bathroom feeling well and sat on the commode. She did not have nausea as with previous episodes. She then lost consciousness.  ILR interrogation demonstrates 13 second pause with ongoing bradycardia for several beats.  She feels well today.  I advised I will review with Dr Caryl Comes and see if we can add her on for virtual visit this afternoon. She is amenable to pacemaker implant if he feels appropriate. We reviewed that this would likely not solve issue completely.        Chanetta Marshall, NP 02/24/2019 10:52 AM

## 2019-02-24 NOTE — Progress Notes (Signed)
Electrophysiology TeleHealth Note   Due to national recommendations of social distancing due to COVID 19, an audio/video telehealth visit is felt to be most appropriate for this patient at this time.  See MyChart message from today for the patient's consent to telehealth for [facility].   Date:  02/24/2019   ID:  [name], DOB 1945/06/13, MRN [id number]  Location: patient's home  Provider location: [address], [place]  Evaluation Performed: Follow-up visit  PCP:  Rusty Aus, MD  Cardiologist:  TG Electrophysiologist:  SK   Chief Complaint:  Syncope   History of Present Illness:    [name] is a 74 y.o. female who presents via audio/video conferencing for a telehealth visit today.  Since last being seen in our clinic for syncope with an implanted loop recorder  the patient reports interval syncope  Was in bed, urge to go BM Went to bathroom sat on commode, typical prodrome but very aburpt and LOC ; no olfactory  Different from previous spells, without nausea.  Recovery symptoms-- different -- disoriented.     DATE TEST EF   6/19 Echo   55-65 % PA pressure normal  7/19  CTA  FFR < .8 OM and not able to calculate LAD  8/19 LHC  50 %LAD mid/80%D1        The patient denies symptoms of fevers, chills, cough, or new SOB worrisome for COVID 19.    Past Medical History:  Diagnosis Date  . Anxiety   . Arthritis   . CHF (congestive heart failure) ([place])   . Chronic back pain    stenosis.degenerative disc,some scoliosis  . Constipation    takes Stool Softener daily  . Coronary artery disease   . Depression    takes Cymbalta daily  . Diabetes mellitus    Type 2 diabetic. Average fasting blood sugar runs high 170-200  . Diastolic CHF ([place]) 123456   Per patient, diagnosed in 2018  . Diverticulosis   . E coli infection   . GERD (gastroesophageal reflux disease)    takes Nexium daily  . Headache   . Hemorrhoids   . History of  colon polyps    benign  . History of gout    doesn't take any meds  . History of hiatal hernia   . History of vertigo    doesn't take any meds  . Hyperlipidemia    takes Praluent daily  . Hypertension    currently BP medications are on hold   . Hypothyroidism    takes Synthroid daily  . Insomnia    takes Restoril nightly  . Muscle spasm    takes Robaxin as needed  . NSVT (nonsustained ventricular tachycardia) ([place])   . OSA on CPAP   . Peripheral vascular disease ([place])    AAA as stated per pt / was just discovered and pt states has not been referred to vascular MD   . Restless leg    takes Requip daily  . Rosacea   . Varicose veins     Past Surgical History:  Procedure Laterality Date  . ABDOMINAL HYSTERECTOMY     with BSo  . CARDIAC CATHETERIZATION  2013   Normal  . CARPAL TUNNEL RELEASE Bilateral   . CATARACT EXTRACTION, BILATERAL    . CHOLECYSTECTOMY    . COLONOSCOPY    . COLONOSCOPY WITH PROPOFOL N/A 11/25/2018   Procedure: COLONOSCOPY WITH PROPOFOL;  Surgeon: Alice Reichert, Benay Pike, MD;  Location: [facility] ENDOSCOPY;  Service: Gastroenterology;  Laterality: N/A;  . DIAGNOSTIC LAPAROSCOPY     multiple times  . DILATION AND CURETTAGE OF UTERUS    . ESOPHAGOGASTRODUODENOSCOPY (EGD) WITH PROPOFOL N/A 11/01/2014   Procedure: ESOPHAGOGASTRODUODENOSCOPY (EGD) WITH PROPOFOL;  Surgeon: Hulen Luster, MD;  [facility]COPY;  Service: Gastroenterology;  Laterality: N/A;  . ESOPHAGOGASTRODUODENOSCOPY (EGD) WITH PROPOFOL N/A 11/25/2018   Procedure: ESOPHAGOGASTRODUODENOSCOPY (EGD) WITH PROPOFOL;  Surgeon: Toledo, Benay Pike, MD;  Location: [facility] ENDOSCOPY;  Service: Gastroenterology;  Laterality: N/A;  . HARDWARE REMOVAL Left 10/11/2016   Procedure: HARDWARE REMOVAL-LEFT RADIUS;  Surgeon: Lovell Sheehan, MD;  Location: [facility] ORS;  Service: Orthopedics;  Laterality: Left;  Left Radius Wrist   . IMPLANTABLE CONTACT LENS IMPLANTATION     bilateral  . LAMINECTOMY  11/13/2015  . LEFT  HEART CATH AND CORONARY ANGIOGRAPHY Left 10/01/2017   Procedure: LEFT HEART CATH AND CORONARY ANGIOGRAPHY;  Surgeon: Nelva Bush, MD;  Location: [place] CV LAB;  Service: Cardiovascular;  Laterality: Left;  . LOOP RECORDER INSERTION N/A 10/17/2017   Procedure: LOOP RECORDER INSERTION;  Surgeon: Deboraha Sprang, MD;  Location: [place] CV LAB;  Service: Cardiovascular;  Laterality: N/A;  . LUMBAR FUSION  11/2015  . LUMBAR WOUND DEBRIDEMENT N/A 12/02/2015   Procedure: WOUND Exploration;  Surgeon: Consuella Lose, MD;  Location: [place];  Service: Neurosurgery;  Laterality: N/A;  . OPEN REDUCTION INTERNAL FIXATION (ORIF) DISTAL RADIAL FRACTURE Left 08/29/2016   Procedure: OPEN REDUCTION INTERNAL FIXATION (ORIF) DISTAL RADIAL FRACTURE;  Surgeon: Lovell Sheehan, MD;  Location: [facility] ORS;  Service: Orthopedics;  Laterality: Left;  . PICC LINE PLACE PERIPHERAL ([place] HX)     right upper arm   . ROTATOR CUFF REPAIR Left   . SAVORY DILATION N/A 11/01/2014   Procedure: SAVORY DILATION;  Surgeon: Hulen Luster, MD;  Location: [facility] ENDOSCOPY;  Service: Gastroenterology;  Laterality: N/A;  . TONSILLECTOMY    . TRIGGER FINGER RELEASE Bilateral     Current Outpatient Medications  Medication Sig Dispense Refill  . albuterol (PROVENTIL HFA;VENTOLIN HFA) 108 (90 Base) MCG/ACT inhaler Inhale 2 puffs into the lungs every 6 (six) hours as needed for wheezing or shortness of breath. 3 Inhaler 3  . Alirocumab (PRALUENT) 150 MG/ML SOAJ Inject 1 pen into the skin every 14 (fourteen) days. 6 pen 3  . aspirin 81 MG tablet Take 81 mg by mouth at bedtime.     [name] atenolol (TENORMIN) 25 MG tablet Take 0.5 tablets (12.5 mg total) by mouth daily. 45 tablet 4  . Azelaic Acid (FINACEA) 15 % FOAM Apply topically as needed.    [name] azelastine (ASTELIN) 0.1 % nasal spray Place 2 sprays into both nostrils at bedtime as needed for rhinitis.     . Cholecalciferol (VITAMIN D) 2000 units tablet Take 2,000 Units by mouth daily.      . ciprofloxacin (CIPRO) 250 MG/5ML (5%) SUSR Take by mouth.    . ciprofloxacin (CIPRO) 500 MG/5ML (10%) suspension Take 500 mg by mouth 2 (two) times daily.    Mariane Baumgarten Calcium (STOOL SOFTENER PO) Take by mouth.    . esomeprazole (NEXIUM) 20 MG capsule Take 20 mg by mouth daily.    [name] gabapentin (NEURONTIN) 300 MG capsule Take 300 mg by mouth 2 (two) times daily.    . insulin glargine (LANTUS) 100 UNIT/ML injection Inject 30 Units into the skin every morning.     [name] ipratropium-albuterol (DUONEB) 0.5-2.5 (3) MG/3ML SOLN Take 3  mLs by nebulization every 6 (six) hours as needed.    [name] levothyroxine (SYNTHROID, LEVOTHROID) 75 MCG tablet Take 75 mcg by mouth daily before breakfast.     . methocarbamol (ROBAXIN) 750 MG tablet Take 750 mg by mouth as needed for muscle spasms.    . metroNIDAZOLE (FLAGYL) 500 MG tablet Take 500 mg by mouth 3 (three) times daily.    . nitroGLYCERIN (NITROSTAT) 0.4 MG SL tablet Place 1 tablet (0.4 mg total) under the tongue every 5 (five) minutes as needed for chest pain. 25 tablet 3  . ondansetron (ZOFRAN) 4 MG tablet Take 4 mg by mouth every 8 (eight) hours as needed for nausea or vomiting.     . potassium chloride SA (K-DUR,KLOR-CON) 20 MEQ tablet Take 2 tablets (40 mEq total) by mouth daily. 180 tablet 3  . ranolazine (RANEXA) 1000 MG SR tablet TAKE 1 TABLET TWICE A DAY 180 tablet 2  . rOPINIRole (REQUIP) 2 MG tablet Take 2 mg by mouth at bedtime.    . temazepam (RESTORIL) 30 MG capsule Take 30 mg by mouth at bedtime as needed for sleep.    [name] torsemide (DEMADEX) 20 MG tablet Take 40 mg by mouth daily.     . traMADol (ULTRAM) 50 MG tablet Take 50 mg by mouth daily as needed for moderate pain or severe pain.     [name] insulin aspart (NOVOLOG) 100 UNIT/ML FlexPen Inject 10-16 Units into the skin See admin instructions. 10 units with breakfast, 10 units with lunch, 16 units at dinner, >200 increase by 2 units.     No current facility-administered medications for this visit.    Facility-Administered Medications Ordered in Other Visits  Medication Dose Route Frequency Provider Last Rate Last Admin  . dexamethasone (DECADRON) injection 10 mg  10 mg Intravenous Once Lovell Sheehan, MD        Allergies:   Darvon [propoxyphene], Reglan [metoclopramide], Statins, Tramadol, Zetia [ezetimibe], Ceftin [cefuroxime axetil], Codeine, Penicillins, and Vicodin [hydrocodone-acetaminophen]   Social History:  The patient  reports that she has never smoked. She has never used smokeless tobacco. She reports that she does not drink alcohol or use drugs.   Family History:  The patient's   family history includes Breast cancer (age of onset: 85) in her mother; Heart attack in her sister; Heart disease in her brother, father, mother, and sister.   ROS:  Please see the history of present illness.   All other systems are personally reviewed and negative.    Exam:    Vital Signs:  BP 127/64   Pulse 74   Ht 5' 1.5" (1.562 m)   Wt 218 lb (98.9 kg)   BMI 40.52 kg/m        Labs/Other Tests and Data Reviewed:    Recent Labs: 05/16/2018: BUN 21; Creatinine, Ser 1.30; Hemoglobin 12.6; Platelets 281; Potassium 3.2; Sodium 141   Wt Readings from Last 3 Encounters:  02/24/19 218 lb (98.9 kg)  02/09/19 218 lb (98.9 kg)  11/25/18 214 lb (97.1 kg)     Other studies personally reviewed: Additional studies/ records that were reviewed today include reviewing the tracings above  Event recorder personally reviewed.  30 seconds of a heart rate less than 30.  Pauses of greater than 13 seconds.  ASSESSMENT & PLAN:   Syncope and exertional lightheadedness  Atrial tachycardia-nonsustained  Obstructive sleep apnea treated   Orthostatic lightheadedness  Obesity  Diastolic heart failure  Recurrent nausea/vomiting  Coronary artery disease with abnormal FFR  Patient had syncope with prolonged bradycardia more than 30 seconds.  There is at least 13 seconds of asystole with  occasional scattered PVCs.  Heart rate slowing is part of the development of her syncope accompanied by her stereotypical prodrome.  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  Given the prolonged nature of the pauses and the paucity of warning, it is reasonable to undertake pacing for reduction of risk of recurrent syncope.  Would use Biotronik CLS.  The benefits and risks were reviewed including but not limited to death,  perforation, infection, lead dislodgement and device malfunction.  The patient understands agrees and is willing to proceed.  She is aware that she is not to drive for 6 months COVID 19 screen The patient denies symptoms of COVID 19 at this time.  The importance of social distancing was discussed today.  Follow-up: Post pacer    Current medicines are reviewed at length with the patient today.   The patient does not have concerns regarding her medicines.  The following changes were made today:  none  Labs/ tests ordered today include:   No orders of the defined types were placed in this encounter.   Future tests ( post COVID )     Patient Risk:  after full review of this patients clinical status, I feel that they are at moderate  risk at this time.  Today, I have spent17 already marginate* minutes with the patient with telehealth technology discussing the above.  Signed, Virl Axe, MD  02/24/2019 5:48 PM     [facility] [address] [place] [address] [phone] (office) [phone] (fax)

## 2019-02-25 LAB — CUP PACEART REMOTE DEVICE CHECK
Date Time Interrogation Session: 20210112151304
Implantable Pulse Generator Implant Date: 20190905

## 2019-02-26 DIAGNOSIS — Z01812 Encounter for preprocedural laboratory examination: Secondary | ICD-10-CM

## 2019-02-26 DIAGNOSIS — R55 Syncope and collapse: Secondary | ICD-10-CM

## 2019-02-26 NOTE — Progress Notes (Signed)
Pt's Procedure Instruction letter sent via [place].  Pt is aware she will be receiving.

## 2019-02-26 NOTE — Telephone Encounter (Signed)
Spoke with pt and reviewed instructions for Pacemaker implantation scheduled for 03/04/2019.  See instruction letter.  Letter also sent to pt via MyChart.  Pt verbalizes understanding and agrees with plan.

## 2019-02-27 ENCOUNTER — Telehealth: Payer: Self-pay | Admitting: Internal Medicine

## 2019-02-27 NOTE — Telephone Encounter (Signed)
New message   Pt wants to know if there is any reason she can't have a steriod injection or surgery after having her ppm implant?

## 2019-03-02 ENCOUNTER — Other Ambulatory Visit
Admission: RE | Admit: 2019-03-02 | Discharge: 2019-03-02 | Disposition: A | Discharge status: Home / Self Care | Payer: Medicare Other | Source: Ambulatory Visit | Attending: Internal Medicine | Admitting: Internal Medicine

## 2019-03-02 DIAGNOSIS — R55 Syncope and collapse: Secondary | ICD-10-CM

## 2019-03-02 DIAGNOSIS — Z20822 Contact with and (suspected) exposure to covid-19: Secondary | ICD-10-CM | POA: Diagnosis not present

## 2019-03-02 DIAGNOSIS — Z01812 Encounter for preprocedural laboratory examination: Secondary | ICD-10-CM | POA: Diagnosis present

## 2019-03-02 LAB — BASIC METABOLIC PANEL
Anion gap: 10 (ref 5–15)
BUN: 20 mg/dL (ref 8–23)
CO2: 30 mmol/L (ref 22–32)
Calcium: 9.3 mg/dL (ref 8.9–10.3)
Chloride: 101 mmol/L (ref 98–111)
Creatinine, Ser: 1.14 mg/dL — ABNORMAL HIGH (ref 0.44–1.00)
GFR calc Af Amer: 55 mL/min — ABNORMAL LOW (ref >60)
GFR calc non Af Amer: 48 mL/min — ABNORMAL LOW (ref >60)
Glucose, Bld: 115 mg/dL — ABNORMAL HIGH (ref 70–99)
Potassium: 3.8 mmol/L (ref 3.5–5.1)
Sodium: 141 mmol/L (ref 135–145)

## 2019-03-02 LAB — CBC
HCT: 39 % (ref 36.0–46.0)
Hemoglobin: 12.3 g/dL (ref 12.0–15.0)
MCH: 27.8 pg (ref 26.0–34.0)
MCHC: 31.5 g/dL (ref 30.0–36.0)
MCV: 88.2 fL (ref 80.0–100.0)
Platelets: 236 10*3/uL (ref 150–400)
RBC: 4.42 MIL/uL (ref 3.87–5.11)
RDW: 14.5 % (ref 11.5–15.5)
WBC: 7.1 10*3/uL (ref 4.0–10.5)
nRBC: 0 % (ref 0.0–0.2)

## 2019-03-02 LAB — SARS CORONAVIRUS 2 (TAT 6-24 HRS): SARS Coronavirus 2: NEGATIVE

## 2019-03-02 NOTE — Telephone Encounter (Signed)
Pt states she is scheduled for an epidural steroid injection for back pain 3 weeks after PPM implant.  Pt asking if she should reschedule that appointment to a later time.  Pt advised she will have limitations with lifting and arm movement.  Pt also asking if she will have her loop recorder explanted during PPM implant or if it will be left in place.  Will route to Dr Caryl Comes and device clinic for review.

## 2019-03-02 NOTE — Addendum Note (Signed)
Addended by: Santiago Bur on: 03/02/2019 11:02 AM   Modules accepted: Orders

## 2019-03-03 NOTE — Telephone Encounter (Signed)
Spoke with pt and advised per Dr Caryl Comes he will plan to explant loop recorder tomorrow (03/04/2019) during PPM placement.  Pt is also ok to have epidural injection 03/2019 for her back pain.  Pt verbalizes understanding and agrees with plan.

## 2019-03-04 ENCOUNTER — Ambulatory Visit (HOSPITAL_COMMUNITY): Payer: Medicare Other

## 2019-03-04 ENCOUNTER — Ambulatory Visit (HOSPITAL_COMMUNITY)
Admission: RE | Admit: 2019-03-04 | Discharge: 2019-03-04 | Disposition: A | Discharge status: Home / Self Care | Payer: Medicare Other | Attending: Internal Medicine | Admitting: Internal Medicine

## 2019-03-04 ENCOUNTER — Other Ambulatory Visit: Payer: Self-pay

## 2019-03-04 ENCOUNTER — Ambulatory Visit (HOSPITAL_COMMUNITY)
Admission: RE | Disposition: A | Discharge status: Home / Self Care | Payer: Self-pay | Source: Home / Self Care | Attending: Internal Medicine

## 2019-03-04 DIAGNOSIS — R55 Syncope and collapse: Secondary | ICD-10-CM | POA: Insufficient documentation

## 2019-03-04 DIAGNOSIS — E669 Obesity, unspecified: Secondary | ICD-10-CM | POA: Diagnosis not present

## 2019-03-04 DIAGNOSIS — R42 Dizziness and giddiness: Secondary | ICD-10-CM | POA: Insufficient documentation

## 2019-03-04 DIAGNOSIS — R001 Bradycardia, unspecified: Secondary | ICD-10-CM | POA: Diagnosis not present

## 2019-03-04 DIAGNOSIS — Z881 Allergy status to other antibiotic agents status: Secondary | ICD-10-CM | POA: Insufficient documentation

## 2019-03-04 DIAGNOSIS — G4733 Obstructive sleep apnea (adult) (pediatric): Secondary | ICD-10-CM | POA: Insufficient documentation

## 2019-03-04 DIAGNOSIS — Z950 Presence of cardiac pacemaker: Secondary | ICD-10-CM

## 2019-03-04 DIAGNOSIS — Z7952 Long term (current) use of systemic steroids: Secondary | ICD-10-CM | POA: Diagnosis not present

## 2019-03-04 DIAGNOSIS — I471 Supraventricular tachycardia: Secondary | ICD-10-CM | POA: Insufficient documentation

## 2019-03-04 DIAGNOSIS — G2581 Restless legs syndrome: Secondary | ICD-10-CM | POA: Insufficient documentation

## 2019-03-04 DIAGNOSIS — Z7982 Long term (current) use of aspirin: Secondary | ICD-10-CM | POA: Diagnosis not present

## 2019-03-04 DIAGNOSIS — Z79899 Other long term (current) drug therapy: Secondary | ICD-10-CM | POA: Insufficient documentation

## 2019-03-04 DIAGNOSIS — I110 Hypertensive heart disease with heart failure: Secondary | ICD-10-CM | POA: Diagnosis not present

## 2019-03-04 DIAGNOSIS — I5032 Chronic diastolic (congestive) heart failure: Secondary | ICD-10-CM | POA: Diagnosis not present

## 2019-03-04 DIAGNOSIS — Z888 Allergy status to other drugs, medicaments and biological substances status: Secondary | ICD-10-CM | POA: Diagnosis not present

## 2019-03-04 DIAGNOSIS — E785 Hyperlipidemia, unspecified: Secondary | ICD-10-CM | POA: Diagnosis not present

## 2019-03-04 DIAGNOSIS — K219 Gastro-esophageal reflux disease without esophagitis: Secondary | ICD-10-CM | POA: Insufficient documentation

## 2019-03-04 DIAGNOSIS — I2510 Atherosclerotic heart disease of native coronary artery without angina pectoris: Secondary | ICD-10-CM | POA: Diagnosis not present

## 2019-03-04 DIAGNOSIS — M1990 Unspecified osteoarthritis, unspecified site: Secondary | ICD-10-CM | POA: Insufficient documentation

## 2019-03-04 DIAGNOSIS — Z6841 Body Mass Index (BMI) 40.0 and over, adult: Secondary | ICD-10-CM | POA: Insufficient documentation

## 2019-03-04 DIAGNOSIS — R112 Nausea with vomiting, unspecified: Secondary | ICD-10-CM | POA: Insufficient documentation

## 2019-03-04 DIAGNOSIS — Z885 Allergy status to narcotic agent status: Secondary | ICD-10-CM | POA: Diagnosis not present

## 2019-03-04 DIAGNOSIS — E039 Hypothyroidism, unspecified: Secondary | ICD-10-CM | POA: Diagnosis not present

## 2019-03-04 DIAGNOSIS — Z79890 Hormone replacement therapy (postmenopausal): Secondary | ICD-10-CM | POA: Insufficient documentation

## 2019-03-04 DIAGNOSIS — E1151 Type 2 diabetes mellitus with diabetic peripheral angiopathy without gangrene: Secondary | ICD-10-CM | POA: Diagnosis not present

## 2019-03-04 DIAGNOSIS — Z880 Allergy status to penicillin: Secondary | ICD-10-CM | POA: Diagnosis not present

## 2019-03-04 DIAGNOSIS — Z8249 Family history of ischemic heart disease and other diseases of the circulatory system: Secondary | ICD-10-CM | POA: Insufficient documentation

## 2019-03-04 DIAGNOSIS — I499 Cardiac arrhythmia, unspecified: Secondary | ICD-10-CM | POA: Diagnosis not present

## 2019-03-04 DIAGNOSIS — Z794 Long term (current) use of insulin: Secondary | ICD-10-CM | POA: Diagnosis not present

## 2019-03-04 DIAGNOSIS — I495 Sick sinus syndrome: Secondary | ICD-10-CM

## 2019-03-04 HISTORY — PX: PACEMAKER IMPLANT: EP1218

## 2019-03-04 HISTORY — PX: LOOP RECORDER REMOVAL: EP1215

## 2019-03-04 HISTORY — DX: Presence of cardiac pacemaker: Z95.0

## 2019-03-04 LAB — GLUCOSE, CAPILLARY
Glucose-Capillary: 127 mg/dL — ABNORMAL HIGH (ref 70–99)
Glucose-Capillary: 117 mg/dL — ABNORMAL HIGH (ref 70–99)
Glucose-Capillary: 113 mg/dL — ABNORMAL HIGH (ref 70–99)

## 2019-03-04 SURGERY — PACEMAKER IMPLANT

## 2019-03-04 MED ORDER — FENTANYL CITRATE (PF) 100 MCG/2ML IJ SOLN
INTRAMUSCULAR | Status: AC
  Filled 2019-03-04: qty 2

## 2019-03-04 MED ORDER — MIDAZOLAM HCL 5 MG/5ML IJ SOLN
INTRAMUSCULAR | Status: AC
  Filled 2019-03-04: qty 5

## 2019-03-04 MED ORDER — LIDOCAINE HCL (PF) 1 % IJ SOLN
INTRAMUSCULAR | Status: DC | PRN
  Administered 2019-03-04: 50 mL
  Administered 2019-03-04: 10 mL

## 2019-03-04 MED ORDER — FENTANYL CITRATE (PF) 100 MCG/2ML IJ SOLN
INTRAMUSCULAR | Status: DC | PRN
  Administered 2019-03-04: 25 ug
  Administered 2019-03-04: 12.5 ug
  Administered 2019-03-04: 25 ug
  Administered 2019-03-04: 50 ug via INTRAVENOUS
  Administered 2019-03-04: 25 ug
  Administered 2019-03-04: 12.5 ug
  Administered 2019-03-04: 25 ug

## 2019-03-04 MED ORDER — ONDANSETRON HCL 4 MG/2ML IJ SOLN: 4.0000 mg | Freq: Four times a day (QID) | INTRAMUSCULAR | Status: DC | PRN

## 2019-03-04 MED ORDER — SODIUM CHLORIDE 0.9 % IV SOLN
80.0000 mg | INTRAVENOUS | Status: AC
  Administered 2019-03-04: 80 mg

## 2019-03-04 MED ORDER — SODIUM CHLORIDE 0.9 % IV SOLN: INTRAVENOUS | Status: DC

## 2019-03-04 MED ORDER — ACETAMINOPHEN 325 MG PO TABS
325.0000 mg | ORAL_TABLET | ORAL | Status: DC | PRN
  Administered 2019-03-04: 650 mg via ORAL
  Filled 2019-03-04 (×3): qty 2

## 2019-03-04 MED ORDER — MIDAZOLAM HCL 5 MG/5ML IJ SOLN
INTRAMUSCULAR | Status: DC | PRN
  Administered 2019-03-04 (×6): 1 mg
  Administered 2019-03-04: 2 mg via INTRAVENOUS

## 2019-03-04 MED ORDER — HEPARIN (PORCINE) IN NACL 1000-0.9 UT/500ML-% IV SOLN
INTRAVENOUS | Status: AC
  Filled 2019-03-04: qty 500

## 2019-03-04 MED ORDER — VANCOMYCIN HCL IN DEXTROSE 1-5 GM/200ML-% IV SOLN
INTRAVENOUS | Status: AC
  Filled 2019-03-04: qty 200

## 2019-03-04 MED ORDER — VANCOMYCIN HCL IN DEXTROSE 1-5 GM/200ML-% IV SOLN
1000.0000 mg | INTRAVENOUS | Status: AC
  Administered 2019-03-04: 1000 mg via INTRAVENOUS

## 2019-03-04 MED ORDER — HEPARIN (PORCINE) IN NACL 1000-0.9 UT/500ML-% IV SOLN
INTRAVENOUS | Status: DC | PRN
  Administered 2019-03-04: 500 mL

## 2019-03-04 MED ORDER — LIDOCAINE HCL 1 % IJ SOLN
INTRAMUSCULAR | Status: AC
  Filled 2019-03-04: qty 60

## 2019-03-04 SURGICAL SUPPLY — 9 items
CABLE SURGICAL S-101-97-12 (CABLE) ×2 IMPLANT
HEMOSTAT SURGICEL 2X4 FIBR (HEMOSTASIS) ×1 IMPLANT
LEAD CAPSURE NOVUS 45CM (Lead) ×1 IMPLANT
LEAD CAPSURE NOVUS 5076-52CM (Lead) ×1 IMPLANT
PACEMAKER EDORA 8DR-T MRI (Pacemaker) ×1 IMPLANT
PACK LOOP INSERTION (CUSTOM PROCEDURE TRAY) ×2 IMPLANT
PAD PRO RADIOLUCENT 2001M-C (PAD) ×2 IMPLANT
SHEATH 7FR PRELUDE SNAP 13 (SHEATH) ×2 IMPLANT
TRAY PACEMAKER INSERTION (PACKS) ×2 IMPLANT

## 2019-03-04 NOTE — Interval H&P Note (Signed)
History and Physical Interval Note:  03/04/2019 1:41 PM  [name]  has presented today for surgery, with the diagnosis of syncope.  The various methods of treatment have been discussed with the patient and family. After consideration of risks, benefits and other options for treatment, the patient has consented to  Procedure(s): PACEMAKER IMPLANT (N/A) LOOP RECORDER REMOVAL (N/A) as a surgical intervention.  The patient's history has been reviewed, patient examined, no change in status, stable for surgery.  I have reviewed the patient's chart and labs.  Questions were answered to the patient's satisfaction.     Virl Axe  Well developed and nourished in no acute distress HENT normal Neck supple with JVP  Loop recorder in place Clear Regular rate and rhythm, no murmurs or gallops Abd-soft with active BS No Clubbing cyanosis edema Skin-warm and dry A & Oriented  Grossly normal sensory and motor function      Assessment and Plan Syncope and exertional lightheadedness Documented pause about 30 sec    Atrial tachycardia-nonsustained   Obstructive sleep apnea treated   Orthostatic lightheadedness   Obesity   Diastolic heart failure   Recurrent nausea/vomiting   Coronary artery disease with abnormal FFR    Patient had syncope with prolonged bradycardia more than 30 seconds.  There is at least 13 seconds of asystole with occasional scattered PVCs.  Heart rate slowing is part of the development of her syncope accompanied by her stereotypical prodrome.  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  Given the prolonged nature of the pauses and the paucity of warning, it is reasonable to undertake pacing for reduction of risk of recurrent syncope.  Would use Biotronik CLS.   The benefits and risks were reviewed including but not limited to death,  perforation, infection, lead dislodgement and device malfunction.  The patient understands agrees and is willing to  proceed  We will also remove the loop recorder

## 2019-03-04 NOTE — Progress Notes (Signed)
Dr Caryl Comes in and okay to d/c home at 2130 if cxr okay

## 2019-03-04 NOTE — Discharge Instructions (Signed)
    03/05/2019, PLEASE SEND A REMOTE DEVICE TRANSMISSION    Supplemental Discharge Instructions for  Pacemaker/Defibrillator Patients  Activity No heavy lifting or vigorous activity with your left/right arm for 6 to 8 weeks.  Do not raise your left/right arm above your head for one week.  Gradually raise your affected arm as drawn below.            03/08/19                      03/09/19                    03/10/19                   03/11/19 __  NO DRIVING 6 mopnths.  WOUND CARE - Keep the wound area clean and dry.  Do not get this area wet for one week. No showers for one week; you may shower on 03/11/19  . - Remove the arm sling 03/05/19 - The clear covering over your pacemaker site (upper left chest) is skin glue (dermabond).  Leave this on, do not peel off.  It will be removed at your wound check visit - The site of your loop monitor removal (lower more central chest) has a plastic dressing that can be removed 03/05/19, there are small paper tapes (steri-strips) underneath, DO NOT remove these - The tape/steri-strips on your wound will fall off; do not pull them off.  No bandage is needed on the site.  DO  NOT apply any creams, oils, or ointments to the wound area. - If you notice any drainage or discharge from the wounds, any swelling or bruising at the sites, or you develop a fever > 101? F after you are discharged home, call the office at once.  Special Instructions - You are still able to use cellular telephones; use the ear opposite the side where you have your pacemaker/defibrillator.  Avoid carrying your cellular phone near your device. - When traveling through airports, show security personnel your identification card to avoid being screened in the metal detectors.  Ask the security personnel to use the hand wand. - Avoid arc welding equipment, MRI testing (magnetic resonance imaging), TENS units (transcutaneous nerve stimulators).  Call the office for questions about other  devices. - Avoid electrical appliances that are in poor condition or are not properly grounded. - Microwave ovens are safe to be near or to operate.

## 2019-03-05 ENCOUNTER — Telehealth: Payer: Self-pay | Admitting: *Deleted

## 2019-03-05 MED FILL — Lidocaine HCl Local Inj 1%: INTRAMUSCULAR | Qty: 60 | Status: AC

## 2019-03-05 NOTE — Telephone Encounter (Signed)
Biotronik home monitor nightly transmission reviewed. Normal PPM function. Available lead trends stable. No episodes.  Spoke with patient. Advised of results. Pt reports she is doing well, some soreness but is managing with Tylenol. Advised to call for any signs/symptoms of infection or questions related to PPM or home monitor. Pt verbalizes understanding and denies additional questions at this time.

## 2019-03-05 NOTE — Telephone Encounter (Signed)
-----   Message from [place], [place] sent at 03/05/2019  6:46 AM EST ----- Same day discharge PPM (and loop removal) w/SK

## 2019-03-06 ENCOUNTER — Telehealth: Payer: Self-pay | Admitting: Internal Medicine

## 2019-03-06 ENCOUNTER — Other Ambulatory Visit: Payer: Self-pay | Admitting: Cardiovascular Disease

## 2019-03-06 NOTE — Telephone Encounter (Signed)
Patient calling in post loop recorder extraction and pace maker removal on 1/20. Patient has been bleeding since last night. It has bled through gauze and shirt through the night. The blood stain was the size of 2 half dollars and is now more of an "ooze". Patient would like to be advised on what to do  Patient does take baby aspirin and did take it last night.

## 2019-03-06 NOTE — Telephone Encounter (Signed)
Patient had PPM implanted and loop extraction 03/04/19. Patient called because she was concerned because she had bleeding from the loop explant site last night and has  ""oozing" at site today. Confirmed that she has no bleeding from PPM site. Instructed patient to apply direct pressure to loop removal site for 10 minutes without interruption and apply bandage over site for next 24 hours. Patient instructed to call office if she has continued bleeding that does not resolve with direct pressure from loop extraction site. Device clinic direct # provided.

## 2019-03-06 NOTE — Telephone Encounter (Signed)
She is scheduled in March w/ Rockey Situ

## 2019-03-06 NOTE — Telephone Encounter (Signed)
Please schedule overdue 6 mo F/U with Dr. Rockey Situ. Thank you!

## 2019-03-17 ENCOUNTER — Other Ambulatory Visit: Payer: Self-pay

## 2019-03-17 ENCOUNTER — Ambulatory Visit (INDEPENDENT_AMBULATORY_CARE_PROVIDER_SITE_OTHER): Payer: Medicare Other | Admitting: *Deleted

## 2019-03-17 DIAGNOSIS — R55 Syncope and collapse: Secondary | ICD-10-CM | POA: Diagnosis not present

## 2019-03-17 NOTE — Patient Instructions (Addendum)
`  Call office if you have any swelling , drainage , redness or incision site becomes warm to touch. Wash incision site with warm soap and water daily.

## 2019-03-20 IMAGING — CR DG CHEST 2V
1 series · 2 of 2 positions shown · non-contrast
Comparison: Chest x-ray dated 07/23/2011.

CLINICAL DATA: Shortness of breath for 2 weeks. Productive cough
for 2 days.

EXAM:
CHEST  2 VIEW

[Series 1: w chest pa · 0.14mm/px · 2 of 2 slices shown]
[im 1/2]
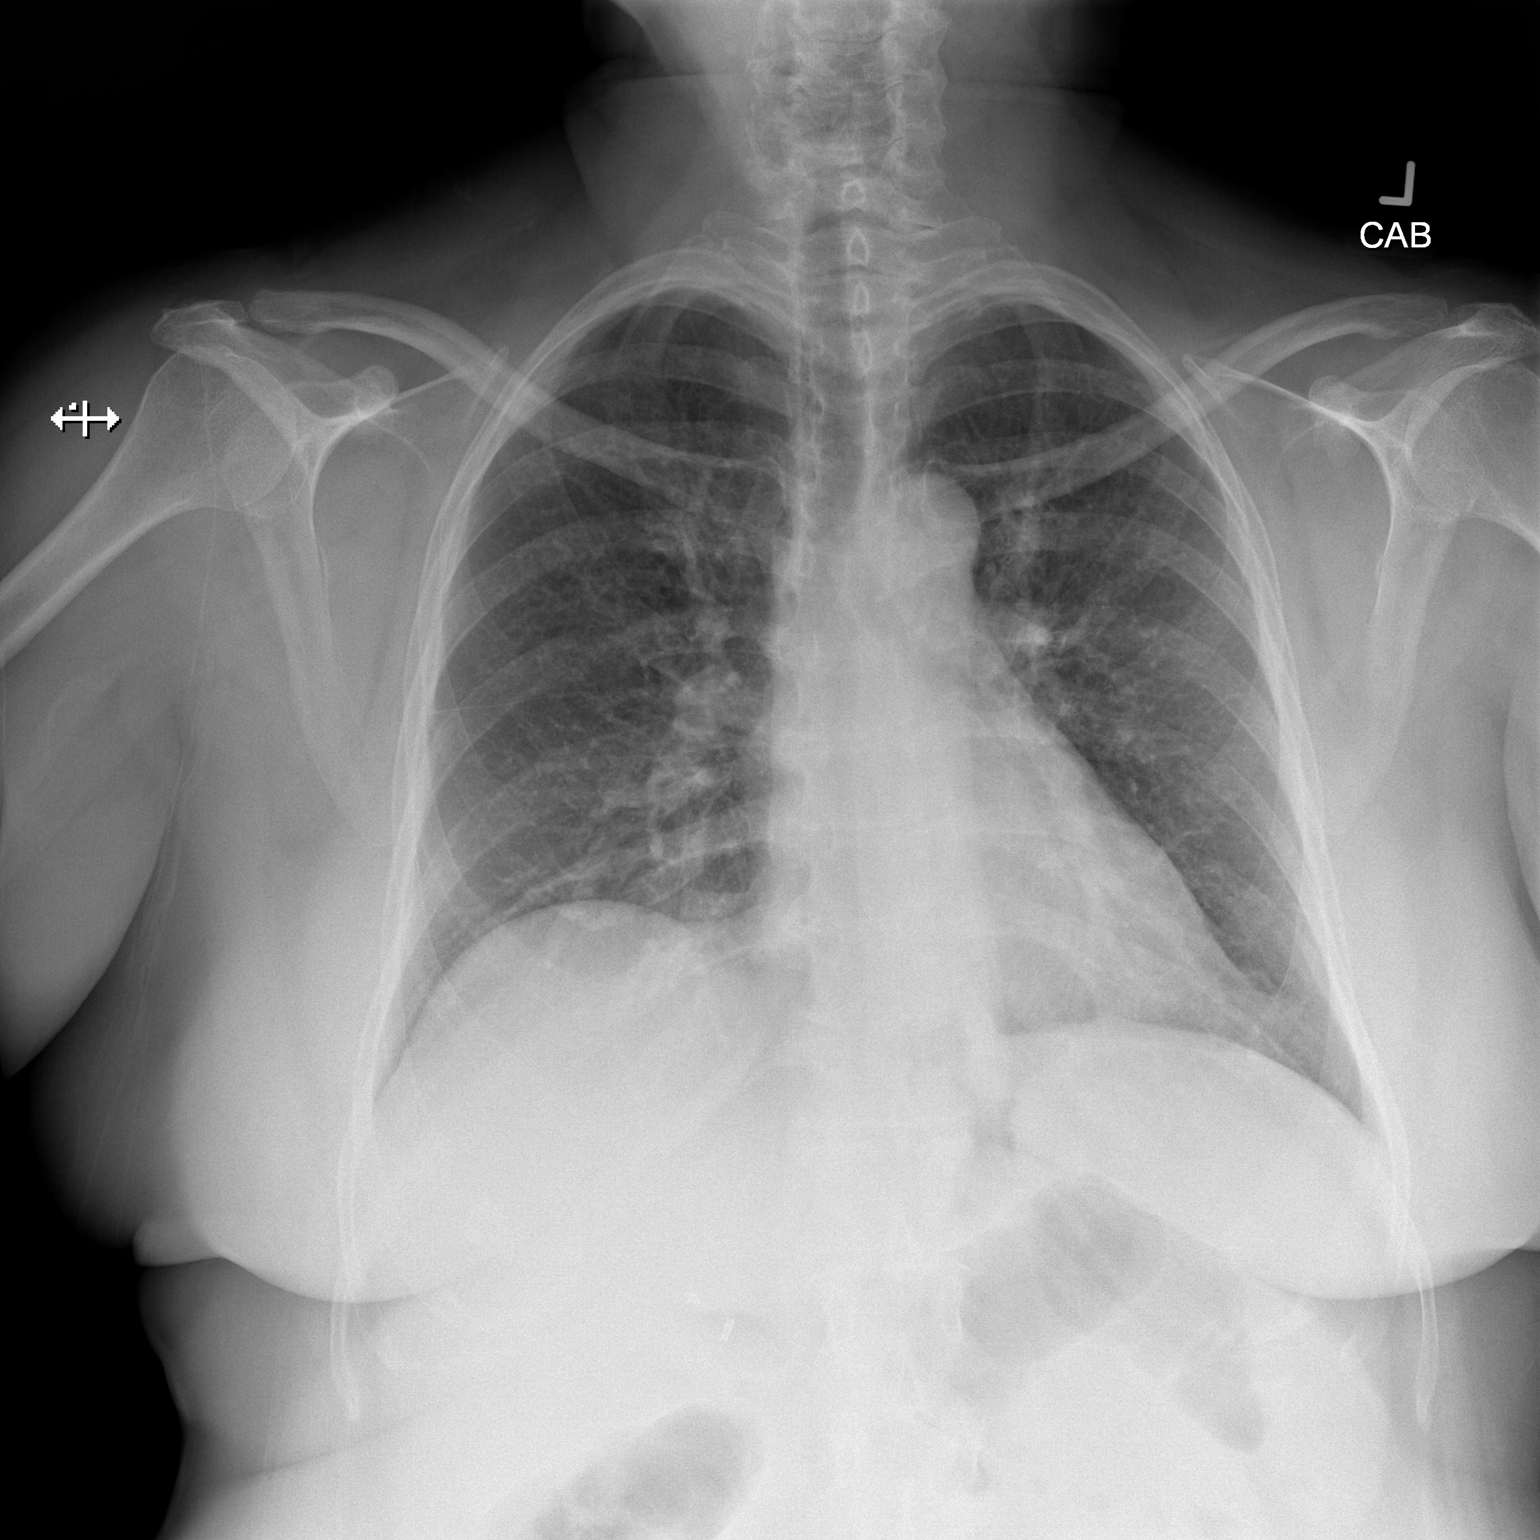
[im 2/2]
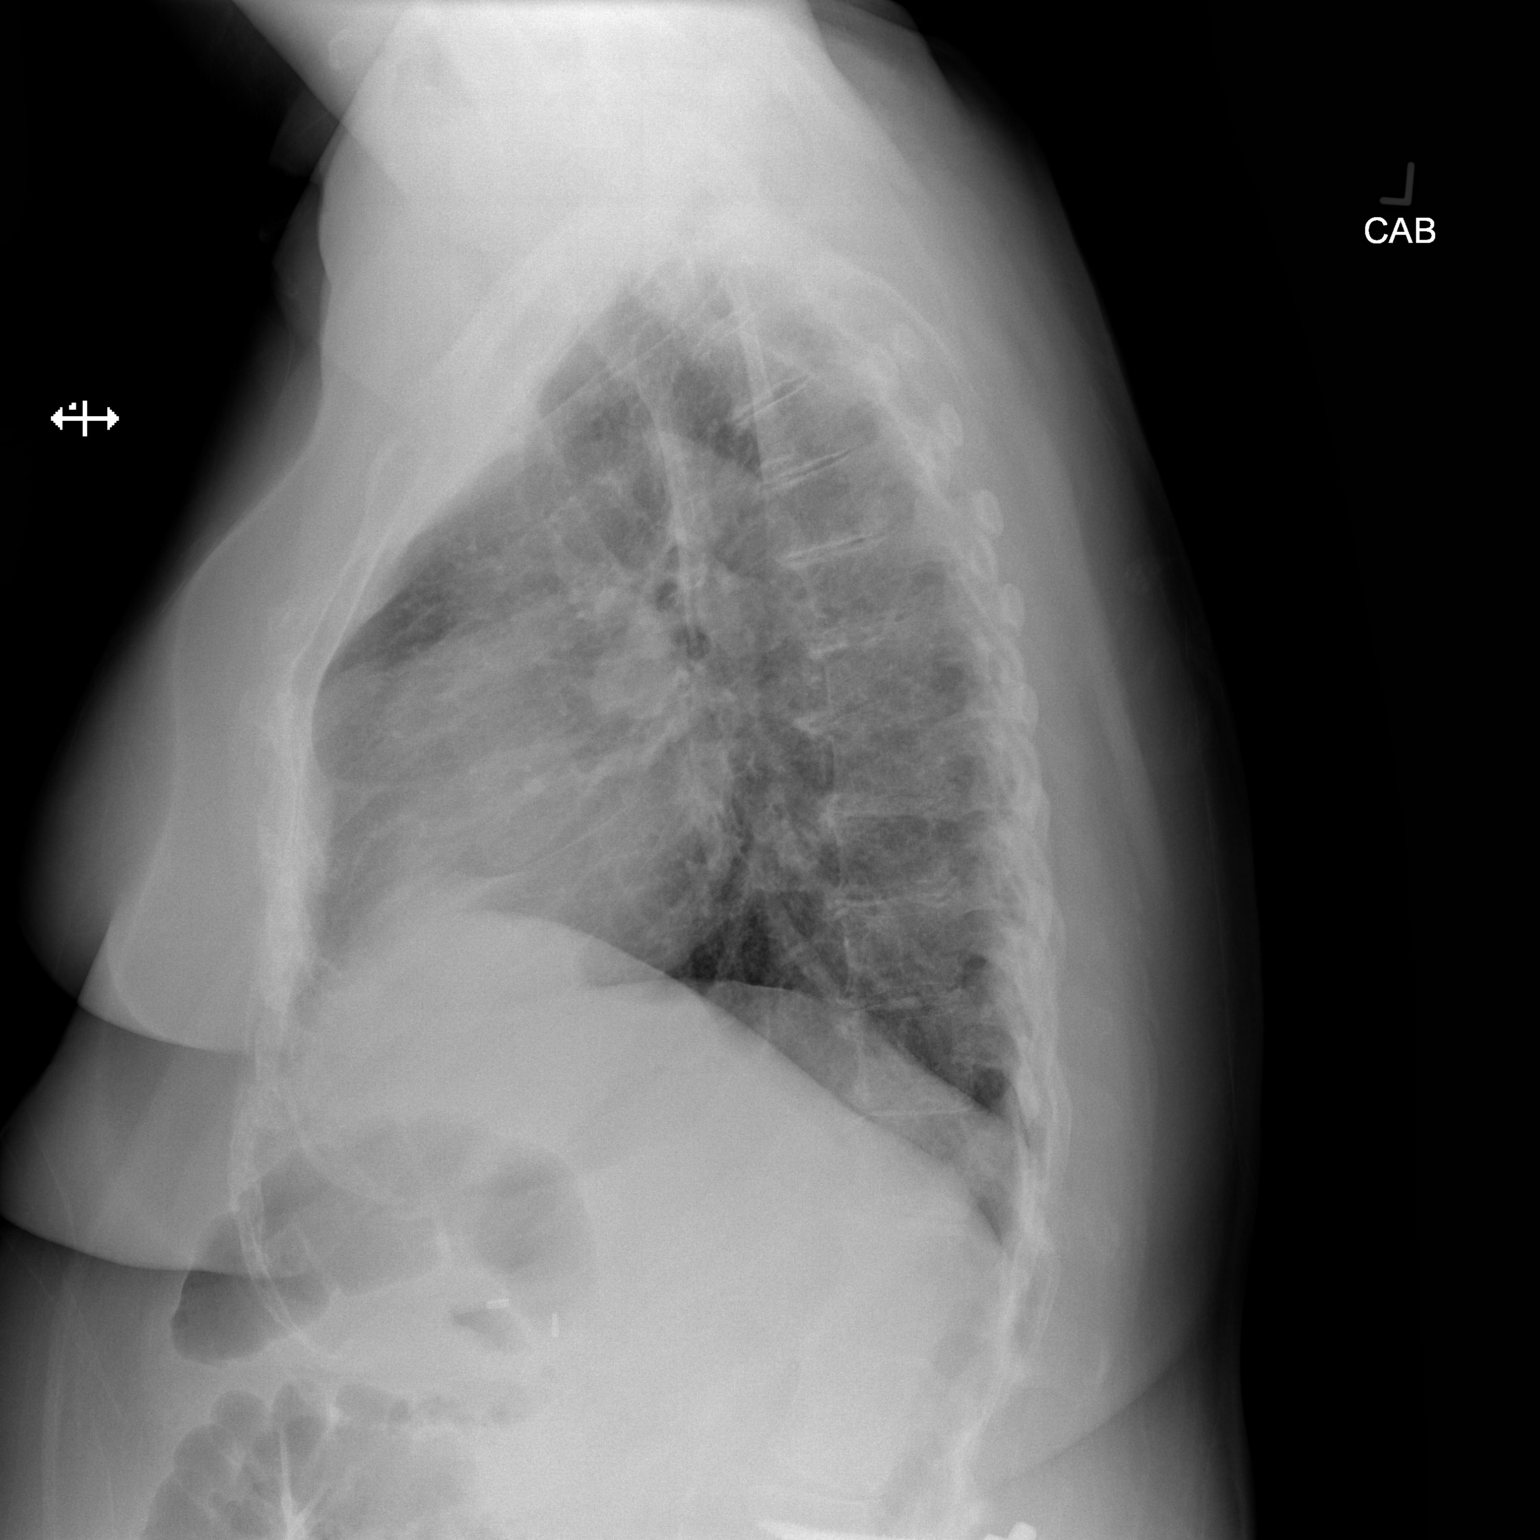

[2 of 2 positions shown; findings below may reference images not displayed]

FINDINGS: Heart size and mediastinal contours are within normal limits.
Chronic bronchitic changes noted centrally. No confluent opacity to
suggest a developing pneumonia. No pleural effusion or pneumothorax
seen. Osseous and soft tissue structures about the chest are
unremarkable.
IMPRESSION: 1. No active cardiopulmonary disease. No evidence of pneumonia or
pulmonary edema.
2. Chronic bronchitic changes centrally.

## 2019-03-24 LAB — CUP PACEART INCLINIC DEVICE CHECK
Date Time Interrogation Session: 20210202153500
Implantable Lead Implant Date: 20210120
Implantable Lead Implant Date: 20210120
Implantable Lead Location: 753859
Implantable Lead Location: 753860
Implantable Lead Model: 5076
Implantable Lead Model: 5076
Implantable Pulse Generator Implant Date: 20210120
Lead Channel Impedance Value: 370 Ohm
Lead Channel Impedance Value: 721 Ohm
Lead Channel Pacing Threshold Amplitude: 0.5 V
Lead Channel Pacing Threshold Amplitude: 0.7 V
Lead Channel Pacing Threshold Pulse Width: 0.4 ms
Lead Channel Pacing Threshold Pulse Width: 0.4 ms
Lead Channel Sensing Intrinsic Amplitude: 10 mV
Lead Channel Sensing Intrinsic Amplitude: 4.5 mV
Lead Channel Setting Pacing Amplitude: 3 V
Lead Channel Setting Pacing Amplitude: 3 V
Lead Channel Setting Pacing Pulse Width: 0.4 ms
Pulse Gen Model: 407145
Pulse Gen Serial Number: 69765687

## 2019-03-24 NOTE — Progress Notes (Signed)
Wound check appointment. derma bond removed. Wound without redness or edema. Incision edges approximated, wound well healed. Normal device function. Thresholds, sensing, and impedances consistent with implant measurements. Device programmed at 3.5V/auto capture programmed on for extra safety margin until 3 month visit. Histogram distribution appropriate for patient and level of activity. No mode switches or high ventricular rates noted. Patient educated about wound care, arm mobility, lifting restrictions. Remote monitoring every 91 days and next remote scheduled for 06/05/19.  ROV 06/16/19 with Dr Caryl Comes.

## 2019-04-09 ENCOUNTER — Telehealth: Payer: Self-pay | Admitting: *Deleted

## 2019-04-09 NOTE — Telephone Encounter (Signed)
Spoke with patient about letter received for disconnected ILR monitor. Advised pt she can disregard letter as she now has a PPM. Unenrolled from [employer]. Pt verbalizes understanding, denies additional questions at this time.

## 2019-04-14 ENCOUNTER — Ambulatory Visit: Payer: Medicare Other | Admitting: Cardiovascular Disease

## 2019-04-15 ENCOUNTER — Ambulatory Visit: Payer: Medicare Other | Admitting: Cardiovascular Disease

## 2019-04-29 DIAGNOSIS — Z950 Presence of cardiac pacemaker: Secondary | ICD-10-CM | POA: Insufficient documentation

## 2019-05-04 NOTE — Progress Notes (Signed)
Date:  05/05/2019   ID:  [name], DOB 1945-03-08, MRN [id number]  Patient Location:  [place] [address]   Provider location:   [facility], [place] office  PCP:  Rusty Aus, MD  Cardiologist:  Arvid Right [facility]   Chief Complaint  Patient presents with  . office visit    Pt would like clearance for back surgery/ Pt concerned w/ epidural for disk procedure caused Oxygen to decrease/ swelling in legs, ankles and feet. Meds verbally reviewed w/ pt.    History of Present Illness:    [name] is a 74 y.o. female  past medical history of CAD,  cardiac catheterization in 2009 , June 2013, August 2019 moderate LAD, diagonal and RCA disease, Obesity,  diabetes,  hyperlipidemia with statin intolerance, on praluent obstructive sleep apnea on CPAP,  Tachycardia episodes,   leg edema, lymphedema compression pumps seen in the hospital for severe hypertension and chest pain,  EF 60% in 07/2016 Loop monitor in place for syncope syncope with prolonged bradycardia more than 30 seconds.  There is at least 13 seconds of asystole with occasional scattered PVCs. Pacer, followed by Dr. Caryl Comes 02/2019  recurrent syncope assoc with nausea and vomiting- who presents for routine followup of her coronary artery disease and diastolic CHF  "Bad" back pain May need surgery L2 to L3 Limited RPM limbing more Weight gaining Afraid of pain meds Seen by Dr. Sabra Heck, "take tramadol", takes 1 a day  Had epidural,pulse alarm went off Unable to complete her shots per the patient  Medications reviewed Torsemide 40 daily With potassium one a day Lab work reviewed Prior potassium 3.2, improved up to 3.8  Chronic chest pain,  Chronic fatigue  Labs reviewed: total chol 105, LDL 35 HBA1C 7.0, up from 6  EKG personally reviewed by myself on todays visit Shows NSR rate 82 bpm no ST or T wave changes  Other past medical hx  reviewed 05/16/2018 left arm pain, etiology unclear Went to the ER, Work up negative, potassium 3.2 NTG did not help Better with toradol  06/11/2018  recurrent syncope assoc with nausea and vomiting Referred back to GI Rare but a problem   Went to the ER 05/16/2018 Left arm pain NTG did not help toradol better  On praluent, forgot a week or so, Numbers jumped Hemoglobin A1c 6.6 Total cholesterol 172 LDL 87, previous cholesterol 150 Creatinine 1.2 BUN 22 Normal LFTs   Other past medical hx reviewed Cath: 10/01/2017 1. Relatively small LAD with moderate mid vessel disease. 2. Small D1 with 80% ostial/proximal stenosis. 3. Large, dominant LCx without significant disease. 4. Small, non-dominant RCA. 5. Moderately elevated LVEDP. RECOMMENDATIONS: 1. Medical therapy for LAD/diagonal disease.  syncope atrial tachycardia with documented Pauses. It was presumed that both events occurred while she was sleeping. --loop monitor placed  Hospitalization for chest pain July 17, 2017 Stress test showing no ischemia, ejection fraction 85% Significant failure to thrive/weakness  Episode of pyelonephritis May 2018 Back in the hospital June [id number] with diastolic CHF, shortness of breath Also with bronchitis requiring steroids [place] was [place] the beach eating out every [place], had significant weight gain and was not taking Lasix  Had back surgery 11/14/2015 Went to rehab Got postop infection, ecoli, sepsis crp 30, sed rate 130 Temp in October 2017 Drain placed 02/15/2016, rare ecoli on ABX, picc line in place Still with significant back pain No fevers,  Insulin has  been increased.  unable to tolerate Vytorin as well as other statins  Admission on October 1, discharge on November 14 2010. She ruled out by cardiac enzymes, chest CT was negative for PE. She had a stress test that showed no ischemia. Her medications were adjusted. CT Scan did show fatty infiltration of the liver,  granulomatous lesion of the left lower lobe   Prior CV studies:   The following studies were reviewed today:   cardiac catheterization 07/25/2011 for chest pain  This showed left dominant coronary system with moderate mid LAD, proximal diagonal #1 and proximal RCA disease all estimated at 50%, normal LV systolic function.   Past Medical History:  Diagnosis Date  . Anxiety   . Arthritis   . CHF (congestive heart failure) ([place])   . Chronic back pain    stenosis.degenerative disc,some scoliosis  . Constipation    takes Stool Softener daily  . Coronary artery disease   . Depression    takes Cymbalta daily  . Diabetes mellitus    Type 2 diabetic. Average fasting blood sugar runs high 170-200  . Diastolic CHF ([place]) [id number]   Per patient, diagnosed in 2018  . Diverticulosis   . E coli infection   . GERD (gastroesophageal reflux disease)    takes Nexium daily  . Headache   . Hemorrhoids   . History of colon polyps    benign  . History of gout    doesn't take any meds  . History of hiatal hernia   . History of vertigo    doesn't take any meds  . Hyperlipidemia    takes Praluent daily  . Hypertension    currently BP medications are on hold   . Hypothyroidism    takes Synthroid daily  . Insomnia    takes Restoril nightly  . Muscle spasm    takes Robaxin as needed  . NSVT (nonsustained ventricular tachycardia) ([place])   . OSA on CPAP   . Peripheral vascular disease ([place])    AAA as stated per pt / was just discovered and pt states has not been referred to vascular MD   . Restless leg    takes Requip daily  . Rosacea   . Varicose veins    Past Surgical History:  Procedure Laterality Date  . ABDOMINAL HYSTERECTOMY     with BSo  . CARDIAC CATHETERIZATION  2013   Normal  . CARPAL TUNNEL RELEASE Bilateral   . CATARACT EXTRACTION, BILATERAL    . CHOLECYSTECTOMY    . COLONOSCOPY    . COLONOSCOPY WITH PROPOFOL N/A 11/25/2018   Procedure: COLONOSCOPY WITH PROPOFOL;   Surgeon: Toledo, Benay Pike, MD;  Location: [facility] ENDOSCOPY;  Service: Gastroenterology;  Laterality: N/A;  . DIAGNOSTIC LAPAROSCOPY     multiple times  . DILATION AND CURETTAGE OF UTERUS    . ESOPHAGOGASTRODUODENOSCOPY (EGD) WITH PROPOFOL N/A 11/01/2014   Procedure: ESOPHAGOGASTRODUODENOSCOPY (EGD) WITH PROPOFOL;  Surgeon: Hulen Luster, MD;  Location: [facility] ENDOSCOPY;  Service: Gastroenterology;  Laterality: N/A;  . ESOPHAGOGASTRODUODENOSCOPY (EGD) WITH PROPOFOL N/A 11/25/2018   Procedure: ESOPHAGOGASTRODUODENOSCOPY (EGD) WITH PROPOFOL;  Surgeon: Toledo, Benay Pike, MD;  Location: [facility] ENDOSCOPY;  Service: Gastroenterology;  Laterality: N/A;  . HARDWARE REMOVAL Left 10/11/2016   Procedure: HARDWARE REMOVAL-LEFT RADIUS;  Surgeon: Lovell Sheehan, MD;  Location: [facility] ORS;  Service: Orthopedics;  Laterality: Left;  Left Radius Wrist   . IMPLANTABLE CONTACT LENS IMPLANTATION     bilateral  . LAMINECTOMY  11/13/2015  . LEFT  HEART CATH AND CORONARY ANGIOGRAPHY Left 10/01/2017   Procedure: LEFT HEART CATH AND CORONARY ANGIOGRAPHY;  Surgeon: Nelva Bush, MD;  Location: [place] CV LAB;  Service: Cardiovascular;  Laterality: Left;  . LOOP RECORDER INSERTION N/A 10/17/2017   Procedure: LOOP RECORDER INSERTION;  Surgeon: Deboraha Sprang, MD;  Location: [place] CV LAB;  Service: Cardiovascular;  Laterality: N/A;  . LOOP RECORDER REMOVAL N/A 03/04/2019   Procedure: LOOP RECORDER REMOVAL;  Surgeon: Deboraha Sprang, MD;  Location: [facility] CV LAB;  Service: Cardiovascular;  Laterality: N/A;  . LUMBAR FUSION  11/2015  . LUMBAR WOUND DEBRIDEMENT N/A 12/02/2015   Procedure: WOUND Exploration;  Surgeon: Consuella Lose, MD;  Location: [place];  Service: Neurosurgery;  Laterality: N/A;  . OPEN REDUCTION INTERNAL FIXATION (ORIF) DISTAL RADIAL FRACTURE Left 08/29/2016   Procedure: OPEN REDUCTION INTERNAL FIXATION (ORIF) DISTAL RADIAL FRACTURE;  Surgeon: Lovell Sheehan, MD;  Location: [facility] ORS;  Service:  Orthopedics;  Laterality: Left;  . PACEMAKER IMPLANT N/A 03/04/2019   Procedure: PACEMAKER IMPLANT;  Surgeon: Deboraha Sprang, MD;  Location: [place] CV LAB;  Service: Cardiovascular;  Laterality: N/A;  . PICC LINE PLACE PERIPHERAL ([place] HX)     right upper arm   . ROTATOR CUFF REPAIR Left   . SAVORY DILATION N/A 11/01/2014   Procedure: SAVORY DILATION;  Surgeon: Hulen Luster, MD;  Location: [facility] ENDOSCOPY;  Service: Gastroenterology;  Laterality: N/A;  . TONSILLECTOMY    . TRIGGER FINGER RELEASE Bilateral      Current Meds  Medication Sig  . albuterol (PROVENTIL HFA;VENTOLIN HFA) 108 (90 Base) MCG/ACT inhaler Inhale 2 puffs into the lungs every 6 (six) hours as needed for wheezing or shortness of breath.  . Alirocumab (PRALUENT) 150 MG/ML SOAJ Inject 1 pen into the skin every 14 (fourteen) days.  [name] aspirin 81 MG tablet Take 81 mg by mouth at bedtime.   [name] aspirin EC 81 MG tablet Take 81 mg by mouth at bedtime.  [name] atenolol (TENORMIN) 25 MG tablet TAKE ONE-HALF (1/2) TABLET DAILY  . Azelaic Acid (FINACEA) 15 % FOAM Apply 1 application topically 2 (two) times daily.   [name] azelastine (ASTELIN) 0.1 % nasal spray Place 2 sprays into both nostrils at bedtime as needed for rhinitis.   . Cholecalciferol (VITAMIN D) 2000 units tablet Take 2,000 Units by mouth daily.  [name] esomeprazole (NEXIUM) 40 MG capsule Take 40 mg by mouth 2 (two) times daily.  [name] gabapentin (NEURONTIN) 300 MG capsule Take 300 mg by mouth 2 (two) times daily.  . insulin aspart (NOVOLOG) 100 UNIT/ML FlexPen Inject 10-16 Units into the skin See admin instructions. 10 units with breakfast, 10 units with lunch, 16 units at dinner, >200 increase by 2 units.  . insulin glargine (LANTUS) 100 UNIT/ML injection Inject 30 Units into the skin daily.   [name] ipratropium-albuterol (DUONEB) 0.5-2.5 (3) MG/3ML SOLN Take 3 mLs by nebulization every 6 (six) hours as needed. (Patient taking differently: Take 3 mLs by nebulization every 6 (six) hours as  needed (wheezing/shortness of breath). )  . levothyroxine (SYNTHROID, LEVOTHROID) 75 MCG tablet Take 75 mcg by mouth daily before breakfast.   . methocarbamol (ROBAXIN) 750 MG tablet Take 375 mg by mouth 3 (three) times daily as needed for muscle spasms.   . nitroGLYCERIN (NITROSTAT) 0.4 MG SL tablet Place 1 tablet (0.4 mg total) under the tongue every 5 (five) minutes as needed for chest pain.  [name] ondansetron (ZOFRAN) 4 MG tablet Take 4  mg by mouth every 8 (eight) hours as needed for nausea or vomiting.   . potassium chloride SA (K-DUR,KLOR-CON) 20 MEQ tablet Take 2 tablets (40 mEq total) by mouth daily. (Patient taking differently: Take 20-40 mEq by mouth daily. )  . ranolazine (RANEXA) 1000 MG SR tablet TAKE 1 TABLET TWICE A DAY (Patient taking differently: Take 500 mg by mouth 2 (two) times daily. )  . ropinirole (REQUIP) 5 MG tablet Take 5 mg by mouth 2 (two) times daily.  . temazepam (RESTORIL) 30 MG capsule Take 30 mg by mouth at bedtime.   . torsemide (DEMADEX) 20 MG tablet Take 20-40 mg by mouth daily.   . traMADol (ULTRAM) 50 MG tablet Take 50 mg by mouth daily as needed for moderate pain or severe pain.   . VEGETABLE LAX+STOOL SOFTENER 8.6-50 MG tablet Take 1 tablet by mouth daily as needed for mild constipation.     Allergies:   Darvon [propoxyphene], Reglan [metoclopramide], Statins, Tramadol, Zetia [ezetimibe], Ceftin [cefuroxime axetil], Codeine, Penicillins, and Vicodin [hydrocodone-acetaminophen]   Social History   Tobacco Use  . Smoking status: Never Smoker  . Smokeless tobacco: Never Used  Substance Use Topics  . Alcohol use: No  . Drug use: No     Family Hx: The patient's family history includes Breast cancer (age of onset: 69) in her mother; Heart attack in her sister; Heart disease in her brother, father, mother, and sister.  ROS:   Please see the history of present illness.    Review of Systems  Constitutional: Negative.   Respiratory: Negative.    Cardiovascular: Positive for chest pain and palpitations.       Arm pain  Gastrointestinal: Negative.   Musculoskeletal: Negative.   Neurological: Negative.   Psychiatric/Behavioral: Negative.   All other systems reviewed and are negative.    Labs/Other Tests and Data Reviewed:    Recent Labs: 03/02/2019: BUN 20; Creatinine, Ser 1.14; Hemoglobin 12.3; Platelets 236; Potassium 3.8; Sodium 141   Recent Lipid Panel Lab Results  Component Value Date/Time   CHOL 163 07/26/2015 08:02 AM   TRIG 187 (H) 07/26/2015 08:02 AM   HDL 32 (L) 07/26/2015 08:02 AM   CHOLHDL 5.1 (H) 07/26/2015 08:02 AM   CHOLHDL 3.8 Ratio 06/10/2008 10:32 PM   LDLCALC 94 07/26/2015 08:02 AM    Wt Readings from Last 3 Encounters:  05/05/19 222 lb (100.7 kg)  03/04/19 217 lb (98.4 kg)  02/24/19 218 lb (98.9 kg)     Exam:    Vital Signs: Vital signs may also be detailed in the HPI BP (!) 148/76 (BP Location: Left Arm, Patient Position: Sitting, Cuff Size: Normal)   Pulse 82   Ht 5' 1.5" (1.562 m)   Wt 222 lb (100.7 kg)   SpO2 96%   BMI 41.27 kg/m    Constitutional:  oriented to person, place, and time. No distress.  HENT:  Head: Grossly normal Eyes:  no discharge. No scleral icterus.  Neck: No JVD, no carotid bruits  Cardiovascular: Regular rate and rhythm, no murmurs appreciated Pulmonary/Chest: Clear to auscultation bilaterally, no wheezes or rails Abdominal: Soft.  no distension.  no tenderness.  Musculoskeletal: Normal range of motion Neurological:  normal muscle tone. Coordination normal. No atrophy Skin: Skin warm and dry Psychiatric: normal affect, pleasant  ASSESSMENT & PLAN:    Atherosclerosis of native coronary artery with stable angina pectoris, unspecified whether native or transplanted heart [facility]) Currently with no symptoms of angina. No further workup at this time.  Continue current medication regimen.  Type 2 diabetes mellitus with stage 3 chronic kidney disease, with long-term  current use of insulin (HCC) Exercise limited by chronic back pain Recommend low carbohydrate diet for slowed weight loss A1c trending up to 7.0 in the past year  Atrial tachycardia ([place]) Followed by Dr. Marylyn Ishihara symptoms  Chronic diastolic heart failure (H[facility]) Stable leg swelling, on torsemide Creatinine 1.3, high end of her range but stable  Sinus node dysfunction ([place]) Followed by Dr. Caryl Comes Has pacer  Lymphedema leg elevation, weight loss Lymphedema pump daily  Essential hypertension Blood pressure mildly elevated after long walk into the office, recommend she monitor pressure at home  OSA on CPAP Reports she is compliant with her CPAP  Syncope Pacemaker in place   Disposition: Follow-up in 6 months   Signed, Ida Rogue, MD  05/05/2019 10:02 AM    [place] Office [address], [place], [place] [address]

## 2019-05-05 ENCOUNTER — Other Ambulatory Visit: Payer: Self-pay

## 2019-05-05 ENCOUNTER — Ambulatory Visit (INDEPENDENT_AMBULATORY_CARE_PROVIDER_SITE_OTHER): Payer: Medicare Other | Admitting: Cardiovascular Disease

## 2019-05-05 ENCOUNTER — Encounter: Payer: Self-pay | Admitting: Cardiovascular Disease

## 2019-05-05 VITALS — BP 148/76 | HR 82 | Ht 61.5 in | Wt 222.0 lb

## 2019-05-05 DIAGNOSIS — I5032 Chronic diastolic (congestive) heart failure: Secondary | ICD-10-CM

## 2019-05-05 DIAGNOSIS — I10 Essential (primary) hypertension: Secondary | ICD-10-CM

## 2019-05-05 DIAGNOSIS — Z950 Presence of cardiac pacemaker: Secondary | ICD-10-CM

## 2019-05-05 DIAGNOSIS — Z794 Long term (current) use of insulin: Secondary | ICD-10-CM

## 2019-05-05 DIAGNOSIS — E1159 Type 2 diabetes mellitus with other circulatory complications: Secondary | ICD-10-CM | POA: Diagnosis not present

## 2019-05-05 DIAGNOSIS — R55 Syncope and collapse: Secondary | ICD-10-CM

## 2019-05-05 DIAGNOSIS — I471 Supraventricular tachycardia: Secondary | ICD-10-CM | POA: Diagnosis not present

## 2019-05-05 MED ORDER — TORSEMIDE 20 MG PO TABS: 40.0000 mg | ORAL_TABLET | Freq: Two times a day (BID) | ORAL | 3 refills | Status: DC

## 2019-05-05 MED ORDER — POTASSIUM CHLORIDE CRYS ER 20 MEQ PO TBCR: 40.0000 meq | EXTENDED_RELEASE_TABLET | Freq: Two times a day (BID) | ORAL | 3 refills | Status: DC

## 2019-05-05 NOTE — Patient Instructions (Addendum)
Medication Instructions:  Continue torsemide 40 daily with 2 potassium For any ankle/foot swelling, take torsemide 40 mg twice a day with 4 potassium (try not to take more than 3x a week with higher dose)   If you need a refill on your cardiac medications before your next appointment, please call your pharmacy.    Lab work: No new labs needed   If you have labs (blood work) drawn today and your tests are completely normal, you will receive your results only by: [name] MyChart Message (if you have MyChart) OR . A paper copy in the mail If you have any lab test that is abnormal or we need to change your treatment, we will call you to review the results.   Testing/Procedures: No new testing needed   Follow-Up: At [facility], you and your health needs are our priority.  As part of our continuing mission to provide you with exceptional heart care, we have created designated Provider Care Teams.  These Care Teams include your primary Cardiologist (physician) and Advanced Practice Providers (APPs -  Physician Assistants and Nurse Practitioners) who all work together to provide you with the care you need, when you need it.  . You will need a follow up appointment in  Oct-nov 2021  . Providers on your designated Care Team:   . Murray Hodgkins, NP . Christell Faith, PA-C . Marrianne Mood, PA-C  Any Other Special Instructions Will Be Listed Below (If Applicable).  COVID-19 Vaccine Information can be found at: [email or web address].uk For questions related to vaccine distribution or appointments, please email [email or web address] or call [phone].

## 2019-05-28 ENCOUNTER — Other Ambulatory Visit: Payer: Self-pay | Admitting: Cardiovascular Disease

## 2019-06-02 ENCOUNTER — Other Ambulatory Visit: Payer: Self-pay | Admitting: Student

## 2019-06-02 DIAGNOSIS — G8929 Other chronic pain: Secondary | ICD-10-CM

## 2019-06-02 DIAGNOSIS — M5416 Radiculopathy, lumbar region: Secondary | ICD-10-CM

## 2019-06-02 DIAGNOSIS — M545 Low back pain, unspecified: Secondary | ICD-10-CM

## 2019-06-02 DIAGNOSIS — M4807 Spinal stenosis, lumbosacral region: Secondary | ICD-10-CM

## 2019-06-03 ENCOUNTER — Ambulatory Visit (INDEPENDENT_AMBULATORY_CARE_PROVIDER_SITE_OTHER): Payer: Medicare Other | Admitting: *Deleted

## 2019-06-03 DIAGNOSIS — R55 Syncope and collapse: Secondary | ICD-10-CM | POA: Diagnosis not present

## 2019-06-03 LAB — CUP PACEART REMOTE DEVICE CHECK
Date Time Interrogation Session: 20210421052916
Implantable Lead Implant Date: 20210120
Implantable Lead Implant Date: 20210120
Implantable Lead Location: 753859
Implantable Lead Location: 753860
Implantable Lead Model: 5076
Implantable Lead Model: 5076
Implantable Pulse Generator Implant Date: 20210120
Pulse Gen Model: 407145
Pulse Gen Serial Number: 69765687

## 2019-06-03 NOTE — Progress Notes (Signed)
Ppm Remote  

## 2019-06-08 ENCOUNTER — Ambulatory Visit
Admission: RE | Admit: 2019-06-08 | Discharge: 2019-06-08 | Disposition: A | Discharge status: Home / Self Care | Payer: Self-pay | Source: Ambulatory Visit | Attending: Diagnostic Radiology | Admitting: Diagnostic Radiology

## 2019-06-08 ENCOUNTER — Other Ambulatory Visit: Payer: Self-pay | Admitting: Diagnostic Radiology

## 2019-06-08 ENCOUNTER — Ambulatory Visit
Admission: RE | Admit: 2019-06-08 | Discharge: 2019-06-08 | Disposition: A | Discharge status: Home / Self Care | Payer: Medicare Other | Source: Ambulatory Visit | Attending: Student | Admitting: Student

## 2019-06-08 ENCOUNTER — Other Ambulatory Visit: Payer: Self-pay

## 2019-06-08 DIAGNOSIS — M545 Low back pain, unspecified: Secondary | ICD-10-CM

## 2019-06-08 DIAGNOSIS — M48061 Spinal stenosis, lumbar region without neurogenic claudication: Secondary | ICD-10-CM | POA: Diagnosis not present

## 2019-06-08 DIAGNOSIS — M5116 Intervertebral disc disorders with radiculopathy, lumbar region: Secondary | ICD-10-CM | POA: Diagnosis not present

## 2019-06-08 DIAGNOSIS — Z981 Arthrodesis status: Secondary | ICD-10-CM | POA: Insufficient documentation

## 2019-06-08 DIAGNOSIS — G8929 Other chronic pain: Secondary | ICD-10-CM

## 2019-06-08 DIAGNOSIS — M5416 Radiculopathy, lumbar region: Secondary | ICD-10-CM

## 2019-06-08 DIAGNOSIS — M4807 Spinal stenosis, lumbosacral region: Secondary | ICD-10-CM

## 2019-06-08 LAB — PROTIME-INR
INR: 0.9 (ref 0.8–1.2)
Prothrombin Time: 12.4 seconds (ref 11.4–15.2)

## 2019-06-08 LAB — CBC
HCT: 38.6 % (ref 36.0–46.0)
Hemoglobin: 12.2 g/dL (ref 12.0–15.0)
MCH: 27.1 pg (ref 26.0–34.0)
MCHC: 31.6 g/dL (ref 30.0–36.0)
MCV: 85.8 fL (ref 80.0–100.0)
Platelets: 216 10*3/uL (ref 150–400)
RBC: 4.5 MIL/uL (ref 3.87–5.11)
RDW: 15 % (ref 11.5–15.5)
WBC: 10 10*3/uL (ref 4.0–10.5)
nRBC: 0 % (ref 0.0–0.2)

## 2019-06-08 LAB — APTT: aPTT: 28 seconds (ref 24–36)

## 2019-06-08 MED ORDER — LIDOCAINE HCL (PF) 1 % IJ SOLN
5.0000 mL | Freq: Once | INTRAMUSCULAR | Status: DC
  Filled 2019-06-08: qty 5

## 2019-06-08 MED ORDER — IOHEXOL 180 MG/ML  SOLN
20.0000 mL | Freq: Once | INTRAMUSCULAR | Status: AC | PRN
  Administered 2019-06-08: 15 mL via INTRATHECAL

## 2019-06-08 NOTE — Progress Notes (Signed)
Patient returns to room 22 from radiology via stretcher s/p myelogram.  No complaints at this time.  Skin warm and dry; respiration even and unlabored.  Patient offered refreshment. Will continue to assess patient and await further instruction from radiologist for discharge orders.

## 2019-06-09 IMAGING — DX DG CHEST 1V PORT
1 series · 1 of 1 positions shown · non-contrast
Comparison: Yesterday

CLINICAL DATA: Aspiration after extubation.

EXAM:
PORTABLE CHEST 1 VIEW

[chest ap]
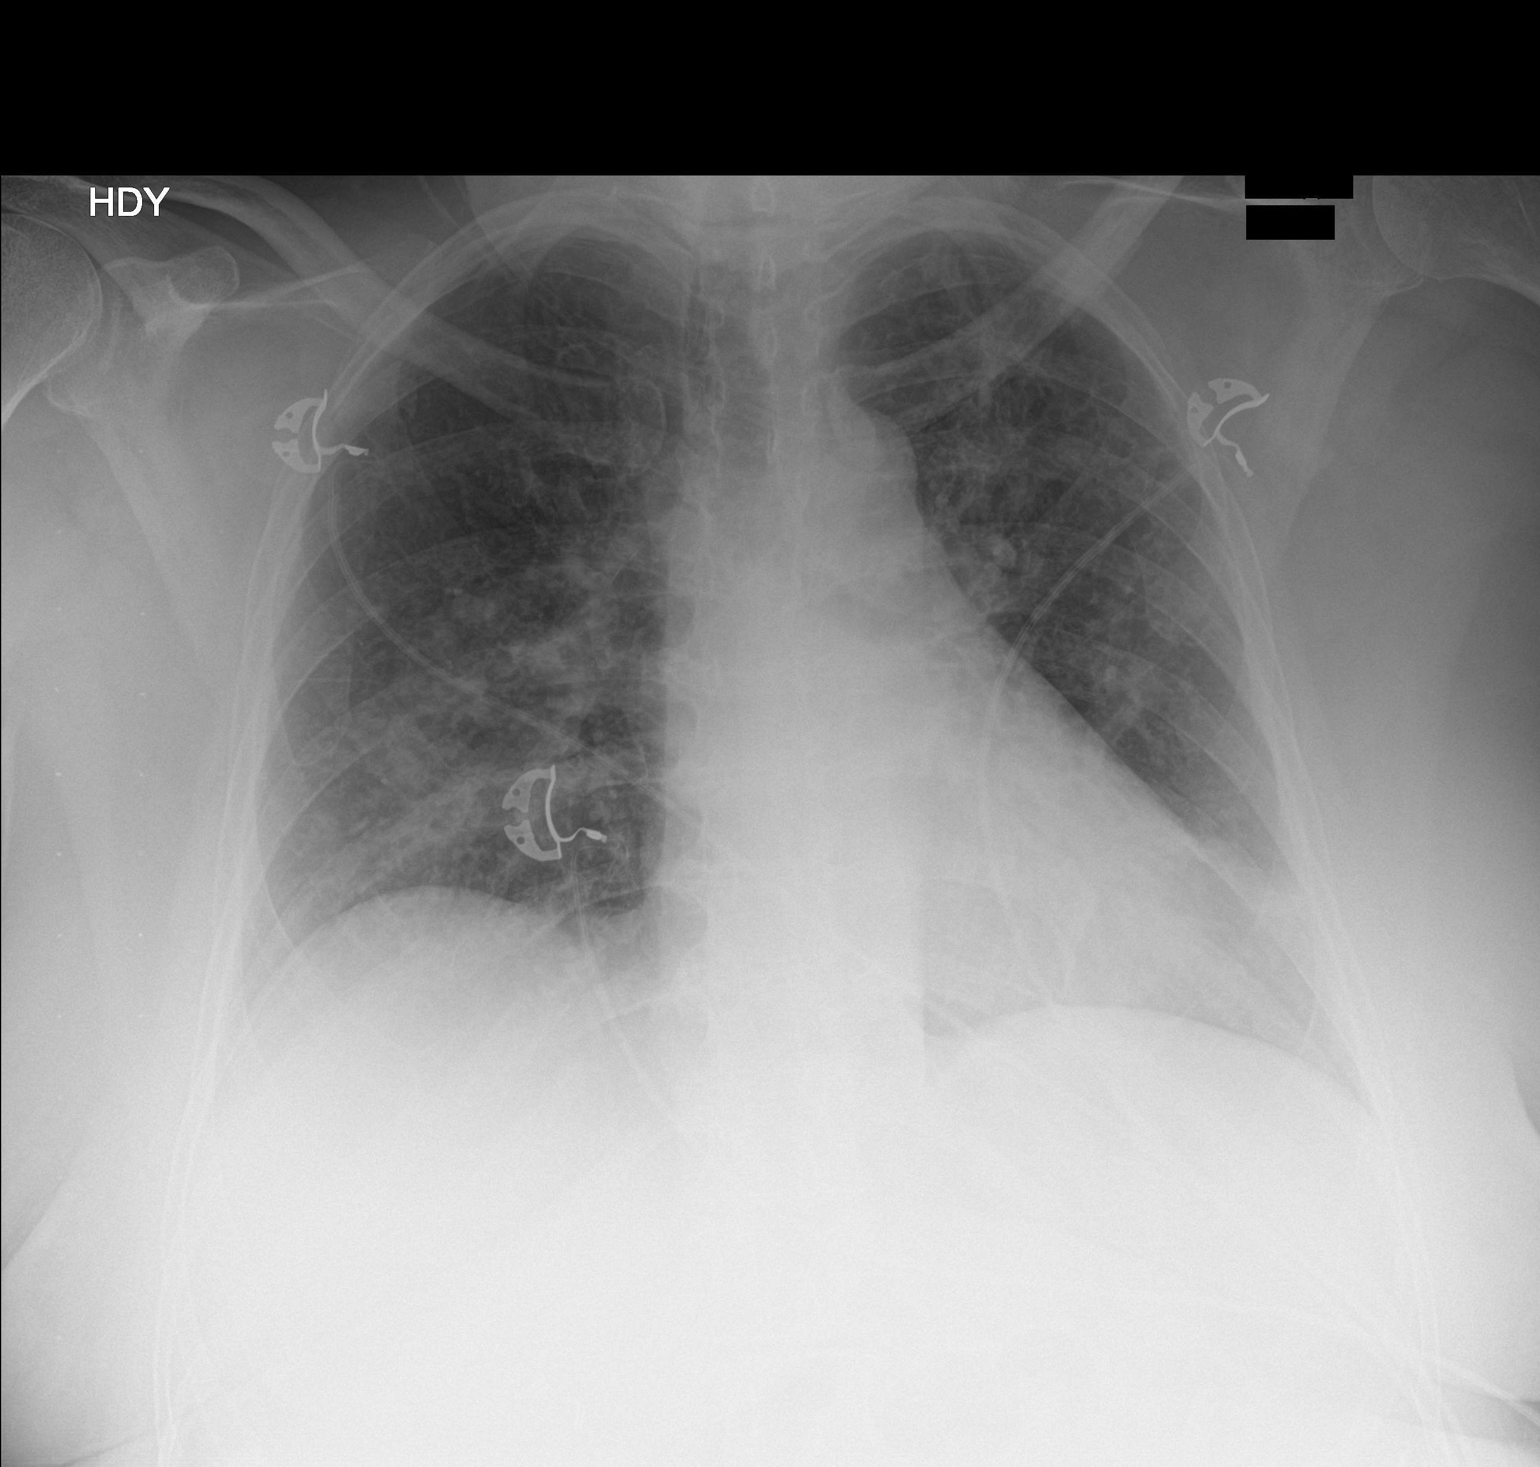

[1 of 1 positions shown; findings below may reference images not displayed]

FINDINGS: Increased patchy right-sided lung opacity from aspiration and/or
pneumonia. Patchy left-sided opacity is also seen. Better inflation.
Stable heart size. No pneumothorax or effusion.
IMPRESSION: 1. History of aspiration with increased pneumonitis or pneumonia on
the right.
2. Better lung inflation compared yesterday.

## 2019-06-12 ENCOUNTER — Other Ambulatory Visit: Payer: Self-pay | Admitting: Student

## 2019-06-12 DIAGNOSIS — M4310 Spondylolisthesis, site unspecified: Secondary | ICD-10-CM

## 2019-06-12 DIAGNOSIS — Z01818 Encounter for other preprocedural examination: Secondary | ICD-10-CM

## 2019-06-16 ENCOUNTER — Other Ambulatory Visit: Payer: Self-pay

## 2019-06-16 ENCOUNTER — Encounter: Payer: Self-pay | Admitting: Internal Medicine

## 2019-06-16 ENCOUNTER — Ambulatory Visit (INDEPENDENT_AMBULATORY_CARE_PROVIDER_SITE_OTHER): Payer: Medicare Other | Admitting: Internal Medicine

## 2019-06-16 VITALS — BP 138/74 | HR 83 | Ht 61.5 in | Wt 214.1 lb

## 2019-06-16 DIAGNOSIS — Z950 Presence of cardiac pacemaker: Secondary | ICD-10-CM

## 2019-06-16 DIAGNOSIS — I471 Supraventricular tachycardia: Secondary | ICD-10-CM | POA: Diagnosis not present

## 2019-06-16 DIAGNOSIS — R55 Syncope and collapse: Secondary | ICD-10-CM

## 2019-06-16 DIAGNOSIS — I455 Other specified heart block: Secondary | ICD-10-CM | POA: Diagnosis not present

## 2019-06-16 DIAGNOSIS — I5032 Chronic diastolic (congestive) heart failure: Secondary | ICD-10-CM

## 2019-06-16 NOTE — Patient Instructions (Signed)
Medication Instructions:  - Your physician recommends that you continue on your current medications as directed. Please refer to the Current Medication list given to you today.  *If you need a refill on your cardiac medications before your next appointment, please call your pharmacy*   Lab Work: - none ordered  If you have labs (blood work) drawn today and your tests are completely normal, you will receive your results only by: [name] MyChart Message (if you have MyChart) OR . A paper copy in the mail If you have any lab test that is abnormal or we need to change your treatment, we will call you to review the results.   Testing/Procedures: - none ordered   Follow-Up: At [facility], you and your health needs are our priority.  As part of our continuing mission to provide you with exceptional heart care, we have created designated Provider Care Teams.  These Care Teams include your primary Cardiologist (physician) and Advanced Practice Providers (APPs -  Physician Assistants and Nurse Practitioners) who all work together to provide you with the care you need, when you need it.  We recommend signing up for the patient portal called "MyChart".  Sign up information is provided on this After Visit Summary.  MyChart is used to connect with patients for Virtual Visits (Telemedicine).  Patients are able to view lab/test results, encounter notes, upcoming appointments, etc.  Non-urgent messages can be sent to your provider as well.   To learn more about what you can do with MyChart, go to [email or web address].    Your next appointment:   9 month(s)  The format for your next appointment:   In Person  Provider:   Virl Axe, MD   Other Instructions n/a

## 2019-06-16 NOTE — Progress Notes (Signed)
Patient Care Team: Rusty Aus, MD as PCP - General (Internal Medicine) Minna Merritts, MD as Consulting Physician (Cardiology)   HPI  [name] is a 74 y.o. female Seen in follow-up for syncope atrial tachycardia with documented pauses.  It was presumed that both events occurred while she was sleeping.  One however is timed at midnight the other at the midday.  The event during the day occurred during nuclear stress testing.    She has subsequently had interval syncopeUnderwent loop recorder insertion for recurrent syncope 9/19 seen 1/21 for interval syncope demonstrating profound bradycardia more than 30 seconds with 13 seconds of asystole.  She underwent Biotronik pacemaker implantation 1/21.  No interval syncope  The patient denies chest pain, shortness of breath, nocturnal dyspnea, orthopnea .  There have been no palpitations, lightheadedness or syncope.  Mild edema takes furosemide.  Denies exuberant salt intake.   She also has a history of exertional chest discomfort for which she underwent CT A FINDINGS of significant coronary disease OM FFR was concerning and LAD FFR was not possible and could not rule out severe stenosis>> LHC See Below   she is also struggling with dyspnea on exertion  For sinus tachycardia, we tried her on beta-blocker; she had not previously be able to tolerate metoprolol succinate.  She is given prescriptions for atenolol, nebivolol and by systolic.  She has tolerated the atenolol well although she is needed to take just 12.5 because of heart rates and blood pressure that were too low    DATE TEST EF   6/19 Echo   55-65 % PA pressure normal  7/19  CTA  FFR < .8 OM and not able to calculate LAD  8/19 LHC  50 %LAD mid/80%D1     Date Cr K Hgb  1/21 1.14 3.8 12.2 (4/21)            Past Medical History:  Diagnosis Date  . Anxiety   . Arthritis   . CHF (congestive heart failure) ([place])   . Chronic back pain    stenosis.degenerative  disc,some scoliosis  . Constipation    takes Stool Softener daily  . Coronary artery disease   . Depression    takes Cymbalta daily  . Diabetes mellitus    Type 2 diabetic. Average fasting blood sugar runs high 170-200  . Diastolic CHF ([place]) [id number]   Per patient, diagnosed in 2018  . Diverticulosis   . E coli infection   . GERD (gastroesophageal reflux disease)    takes Nexium daily  . Headache   . Hemorrhoids   . History of colon polyps    benign  . History of gout    doesn't take any meds  . History of hiatal hernia   . History of vertigo    doesn't take any meds  . Hyperlipidemia    takes Praluent daily  . Hypertension    currently BP medications are on hold   . Hypothyroidism    takes Synthroid daily  . Insomnia    takes Restoril nightly  . Muscle spasm    takes Robaxin as needed  . NSVT (nonsustained ventricular tachycardia) ([place])   . OSA on CPAP   . Peripheral vascular disease ([place])    AAA as stated per pt / was just discovered and pt states has not been referred to vascular MD   . Restless leg    takes Requip daily  . Rosacea   .  Varicose veins     Past Surgical History:  Procedure Laterality Date  . ABDOMINAL HYSTERECTOMY     with BSo  . CARDIAC CATHETERIZATION  2013   Normal  . CARPAL TUNNEL RELEASE Bilateral   . CATARACT EXTRACTION, BILATERAL    . CHOLECYSTECTOMY    . COLONOSCOPY    . COLONOSCOPY WITH PROPOFOL N/A 11/25/2018   Procedure: COLONOSCOPY WITH PROPOFOL;  Surgeon: Toledo, Benay Pike, MD;  Location: [facility] ENDOSCOPY;  Service: Gastroenterology;  Laterality: N/A;  . DIAGNOSTIC LAPAROSCOPY     multiple times  . DILATION AND CURETTAGE OF UTERUS    . ESOPHAGOGASTRODUODENOSCOPY (EGD) WITH PROPOFOL N/A 11/01/2014   Procedure: ESOPHAGOGASTRODUODENOSCOPY (EGD) WITH PROPOFOL;  Surgeon: Hulen Luster, MD;  Location: [facility] ENDOSCOPY;  Service: Gastroenterology;  Laterality: N/A;  . ESOPHAGOGASTRODUODENOSCOPY (EGD) WITH PROPOFOL N/A 11/25/2018    Procedure: ESOPHAGOGASTRODUODENOSCOPY (EGD) WITH PROPOFOL;  Surgeon: Toledo, Benay Pike, MD;  Location: [facility] ENDOSCOPY;  Service: Gastroenterology;  Laterality: N/A;  . HARDWARE REMOVAL Left 10/11/2016   Procedure: HARDWARE REMOVAL-LEFT RADIUS;  Surgeon: Lovell Sheehan, MD;  Location: [facility] ORS;  Service: Orthopedics;  Laterality: Left;  Left Radius Wrist   . IMPLANTABLE CONTACT LENS IMPLANTATION     bilateral  . LAMINECTOMY  11/13/2015  . LEFT HEART CATH AND CORONARY ANGIOGRAPHY Left 10/01/2017   Procedure: LEFT HEART CATH AND CORONARY ANGIOGRAPHY;  Surgeon: Nelva Bush, MD;  Location: [place] CV LAB;  Service: Cardiovascular;  Laterality: Left;  . LOOP RECORDER INSERTION N/A 10/17/2017   Procedure: LOOP RECORDER INSERTION;  Surgeon: Deboraha Sprang, MD;  Location: [place] CV LAB;  Service: Cardiovascular;  Laterality: N/A;  . LOOP RECORDER REMOVAL N/A 03/04/2019   Procedure: LOOP RECORDER REMOVAL;  Surgeon: Deboraha Sprang, MD;  Location: [place] CV LAB;  Service: Cardiovascular;  Laterality: N/A;  . LUMBAR FUSION  11/2015  . LUMBAR WOUND DEBRIDEMENT N/A 12/02/2015   Procedure: WOUND Exploration;  Surgeon: Consuella Lose, MD;  Location: [place];  Service: Neurosurgery;  Laterality: N/A;  . OPEN REDUCTION INTERNAL FIXATION (ORIF) DISTAL RADIAL FRACTURE Left 08/29/2016   Procedure: OPEN REDUCTION INTERNAL FIXATION (ORIF) DISTAL RADIAL FRACTURE;  Surgeon: Lovell Sheehan, MD;  Location: [facility] ORS;  Service: Orthopedics;  Laterality: Left;  . PACEMAKER IMPLANT N/A 03/04/2019   Procedure: PACEMAKER IMPLANT;  Surgeon: Deboraha Sprang, MD;  Location: [place] [facility] LAB;  Service: Cardiovascular;  Laterality: N/A;  . PICC LINE PLACE PERIPHERAL ([place] [facility])     right upper arm   . ROTATOR CUFF REPAIR Left   . SAVORY DILATION N/A 11/01/2014   Procedure: SAVORY DILATION;  Surgeon: Hulen Luster, MD;  Location: [facility] ENDOSCOPY;  Service: Gastroenterology;  Laterality: N/A;  . TONSILLECTOMY    .  TRIGGER FINGER RELEASE Bilateral     Current Meds  Medication Sig  . Acetaminophen (TYLENOL ARTHRITIS PAIN PO) Take by mouth as needed.  [name] albuterol (PROVENTIL HFA;VENTOLIN HFA) 108 (90 Base) MCG/ACT inhaler Inhale 2 puffs into the lungs every 6 (six) hours as needed for wheezing or shortness of breath.  . Alirocumab (PRALUENT) 150 MG/ML SOAJ Inject 1 pen into the skin every 14 (fourteen) days.  [name] aspirin 81 MG tablet Take 81 mg by mouth at bedtime.   [name] atenolol (TENORMIN) 25 MG tablet TAKE ONE-HALF (1/2) TABLET DAILY  . Azelaic Acid (FINACEA) 15 % FOAM Apply 1 application topically 2 (two) times daily.   [name] azelastine (ASTELIN) 0.1 % nasal spray Place 2 sprays into both  nostrils at bedtime as needed for rhinitis.   . Cholecalciferol (VITAMIN D) 2000 units tablet Take 2,000 Units by mouth daily.  . diazepam (VALIUM) 5 MG tablet TAKE 1 TO 2 TABLETS 30 MINUTES BEFORE ESI  . esomeprazole (NEXIUM) 40 MG capsule Take 40 mg by mouth 2 (two) times daily.  [name] gabapentin (NEURONTIN) 300 MG capsule Take 300 mg by mouth 2 (two) times daily.  . insulin aspart (NOVOLOG) 100 UNIT/ML FlexPen Inject 10-16 Units into the skin See admin instructions. 10 units with breakfast, 10 units with lunch, 16 units at dinner, >200 increase by 2 units.  . insulin glargine (LANTUS) 100 UNIT/ML injection Inject 30 Units into the skin daily.   [name] ipratropium-albuterol (DUONEB) 0.5-2.5 (3) MG/3ML SOLN Take 3 mLs by nebulization every 6 (six) hours as needed. (Patient taking differently: Take 3 mLs by nebulization every 6 (six) hours as needed (wheezing/shortness of breath). )  . levothyroxine (SYNTHROID, LEVOTHROID) 75 MCG tablet Take 75 mcg by mouth daily before breakfast.   . methocarbamol (ROBAXIN) 750 MG tablet Take 375 mg by mouth 3 (three) times daily as needed for muscle spasms.   . nitroGLYCERIN (NITROSTAT) 0.4 MG SL tablet Place 1 tablet (0.4 mg total) under the tongue every 5 (five) minutes as needed for chest pain.  [name]  ondansetron (ZOFRAN) 4 MG tablet Take 4 mg by mouth every 8 (eight) hours as needed for nausea or vomiting.   . potassium chloride SA (KLOR-CON) 20 MEQ tablet Take 2 tablets (40 mEq total) by mouth 2 (two) times daily.  . ranolazine (RANEXA) 1000 MG SR tablet TAKE 1 TABLET TWICE A DAY (Patient taking differently: Take 500 mg by mouth 2 (two) times daily. )  . ropinirole (REQUIP) 5 MG tablet Take 5 mg by mouth 2 (two) times daily.  . temazepam (RESTORIL) 30 MG capsule Take 30 mg by mouth at bedtime.   . torsemide (DEMADEX) 20 MG tablet Take 2 tablets (40 mg total) by mouth 2 (two) times daily.  . traMADol (ULTRAM) 50 MG tablet Take 50 mg by mouth daily as needed for moderate pain or severe pain.   . VEGETABLE LAX+STOOL SOFTENER 8.6-50 MG tablet Take 1 tablet by mouth daily as needed for mild constipation.    Allergies  Allergen Reactions  . Darvon [Propoxyphene] Other (See Comments)    Unsure of reaction type  . Reglan [Metoclopramide] Diarrhea  . Statins Other (See Comments)    Leg pain  . Tramadol Hives  . Zetia [Ezetimibe] Diarrhea  . Ceftin [Cefuroxime Axetil] Diarrhea  . Codeine Rash       . Penicillins Rash and Other (See Comments)    Has patient had a PCN reaction causing immediate rash, facial/tongue/throat swelling, SOB or lightheadedness with hypotension: NO Has patient had a PCN reaction causing severe rash involving mucus membranes or skin necrosis: NO Has patient had a PCN retioion that required hospitalization NO Has patient had a PCN reaction occurring within the last 10 years: NO If all of the above answers are "NO", then may proceed with Cephalosporin use.  . Vicodin [Hydrocodone-Acetaminophen] Other (See Comments)    passes out      Review of Systems negative except from HPI and PMH  Physical Exam BP 138/74 (BP Location: Right Arm, Patient Position: Sitting, Cuff Size: Normal)   Pulse 83   Ht 5' 1.5" (1.562 m)   Wt 214 lb 2 oz (97.1 kg)   SpO2 98%   BMI  39.80 kg/m  Well  developed and well nourished in no acute distress HENT normal Neck supple with JVP-flat Clear Device pocket well healed; without hematoma or erythema.  There is no tethering  Regular rate and rhythm, no  gallop No  murmur Abd-soft with active BS No Clubbing cyanosis  edema Skin-warm and dry A & Oriented  Grossly normal sensory and motor function  ECG atrial pacing at 83 Interval 16/08/38    Assessment and Plan:  Syncope and exertional lightheadedness with prolonged asystole question neurally mediated  Atrial tachycardia-nonsustained  Pacemaker-Biotronik  Obstructive sleep apnea treated  Obesity  Diastolic heart failure  Coronary artery disease with abnormal FFR>> Cath mod LAD/D1 disease   No interval syncope.  Without symptoms of ischemia  Euvolemic continue current meds

## 2019-06-18 ENCOUNTER — Ambulatory Visit
Admission: RE | Admit: 2019-06-18 | Discharge: 2019-06-18 | Disposition: A | Discharge status: Home / Self Care | Payer: Medicare Other | Source: Ambulatory Visit | Attending: Student | Admitting: Student

## 2019-06-18 ENCOUNTER — Other Ambulatory Visit: Payer: Self-pay

## 2019-06-18 DIAGNOSIS — Z01818 Encounter for other preprocedural examination: Secondary | ICD-10-CM | POA: Insufficient documentation

## 2019-06-18 DIAGNOSIS — M4310 Spondylolisthesis, site unspecified: Secondary | ICD-10-CM | POA: Diagnosis present

## 2019-07-24 ENCOUNTER — Telehealth: Payer: Self-pay

## 2019-07-24 NOTE — Telephone Encounter (Signed)
The pt wants to know if it will be okay or would it be compatible for her to have a Medtronic spinal stimulation device while having a Biotronik ppm? She wants to try this out. If she cannot use this then she would have to have surgery. I let the pt know that the nurse will give her a call back.

## 2019-07-24 NOTE — Telephone Encounter (Signed)
Returned call to patient. Pt is considering a spinal cord stimulator for back pain but is concerned the stimulator will interfere with her PPM. Explained that per the Biotronik EMI guide, current-inducing methods such as neurostimulation may cause interference with the device and present a possible contraindication. Encouraged pt to contact Biotronik technical services for more information regarding potential for interference and their recommendations for any EMI testing. Phone number provided. Pt aware to call our office with any further questions or concerns.

## 2019-07-28 ENCOUNTER — Telehealth: Payer: Self-pay | Admitting: *Deleted

## 2019-07-28 NOTE — Telephone Encounter (Signed)
Agree  with Subclinical Atrial fibrillation present   will folllow

## 2019-07-28 NOTE — Telephone Encounter (Signed)
Biotronik PPM alert received for AF episode on 07/27/19, 13 min 24 sec duration. Likely SCAF, but routed to Dr. Caryl Comes for confirmation. On ASA 81mg , not currently on [place].

## 2019-09-02 ENCOUNTER — Ambulatory Visit (INDEPENDENT_AMBULATORY_CARE_PROVIDER_SITE_OTHER): Payer: Medicare Other | Admitting: *Deleted

## 2019-09-02 DIAGNOSIS — I471 Supraventricular tachycardia: Secondary | ICD-10-CM

## 2019-09-02 DIAGNOSIS — I5032 Chronic diastolic (congestive) heart failure: Secondary | ICD-10-CM

## 2019-09-03 LAB — CUP PACEART REMOTE DEVICE CHECK
Date Time Interrogation Session: 20210721082501
Implantable Lead Implant Date: 20210120
Implantable Lead Implant Date: 20210120
Implantable Lead Location: 753859
Implantable Lead Location: 753860
Implantable Lead Model: 5076
Implantable Lead Model: 5076
Implantable Pulse Generator Implant Date: 20210120
Lead Channel Setting Pacing Amplitude: 3 V
Lead Channel Setting Pacing Amplitude: 3 V
Lead Channel Setting Pacing Pulse Width: 0.4 ms
Pulse Gen Model: 407145
Pulse Gen Serial Number: 69765687

## 2019-09-03 NOTE — Progress Notes (Signed)
Remote pacemaker transmission.   

## 2019-09-11 ENCOUNTER — Encounter: Payer: Self-pay | Admitting: Cardiovascular Disease

## 2019-09-11 ENCOUNTER — Other Ambulatory Visit: Payer: Self-pay

## 2019-09-11 ENCOUNTER — Ambulatory Visit (INDEPENDENT_AMBULATORY_CARE_PROVIDER_SITE_OTHER): Payer: Medicare Other | Admitting: Cardiovascular Disease

## 2019-09-11 VITALS — BP 140/58 | HR 91 | Ht 62.0 in | Wt 213.0 lb

## 2019-09-11 DIAGNOSIS — Z794 Long term (current) use of insulin: Secondary | ICD-10-CM

## 2019-09-11 DIAGNOSIS — I5032 Chronic diastolic (congestive) heart failure: Secondary | ICD-10-CM | POA: Diagnosis not present

## 2019-09-11 DIAGNOSIS — R55 Syncope and collapse: Secondary | ICD-10-CM | POA: Diagnosis not present

## 2019-09-11 DIAGNOSIS — I471 Supraventricular tachycardia: Secondary | ICD-10-CM | POA: Diagnosis not present

## 2019-09-11 DIAGNOSIS — I10 Essential (primary) hypertension: Secondary | ICD-10-CM

## 2019-09-11 DIAGNOSIS — Z950 Presence of cardiac pacemaker: Secondary | ICD-10-CM

## 2019-09-11 DIAGNOSIS — E1159 Type 2 diabetes mellitus with other circulatory complications: Secondary | ICD-10-CM

## 2019-09-11 MED ORDER — ATENOLOL 25 MG PO TABS
12.5000 mg | ORAL_TABLET | Freq: Every day | ORAL | 3 refills | Status: DC
Start: 2019-09-11 — End: 2020-10-19

## 2019-09-11 MED ORDER — ATENOLOL 25 MG PO TABS
12.5000 mg | ORAL_TABLET | Freq: Two times a day (BID) | ORAL | 3 refills | Status: DC
Start: 2019-09-11 — End: 2019-09-11

## 2019-09-11 NOTE — Patient Instructions (Addendum)
Medication Instructions:  Your physician has recommended you make the following change in your medication:  1. STOP any Metoprolol 2. START Atenolol 25 mg take 1/2 tablet once daily   If you need a refill on your cardiac medications before your next appointment, please call your pharmacy.    Lab work: No new labs needed   If you have labs (blood work) drawn today and your tests are completely normal, you will receive your results only by: [name] MyChart Message (if you have MyChart) OR . A paper copy in the mail If you have any lab test that is abnormal or we need to change your treatment, we will call you to review the results.   Testing/Procedures: No new testing needed   Follow-Up: At [facility], you and your health needs are our priority.  As part of our continuing mission to provide you with exceptional heart care, we have created designated Provider Care Teams.  These Care Teams include your primary Cardiologist (physician) and Advanced Practice Providers (APPs -  Physician Assistants and Nurse Practitioners) who all work together to provide you with the care you need, when you need it.  . You will need a follow up appointment in 6 months .  [name] Providers on your designated Care Team:   . Murray Hodgkins, NP . Christell Faith, PA-C . Marrianne Mood, PA-C  Any Other Special Instructions Will Be Listed Below (If Applicable).  COVID-19 Vaccine Information can be found at: [email or web address].uk For questions related to vaccine distribution or appointments, please email [email or web address] or call [phone].

## 2019-09-11 NOTE — Progress Notes (Signed)
Date:  09/11/2019   ID:  [name], DOB November 19, 1945, MRN [id number]  Patient Location:  [place] [address]   Provider location:   [facility], [place] office  PCP:  Rusty Aus, MD  Cardiologist:  [facility]   Chief Complaint  Patient presents with  . other    6 month follow up and discuss medications. "doing well." Pt. c/o the metoprolol causing diarrhea and needs to take something else.     History of Present Illness:    [name] is a 74 y.o. female  past medical history of CAD,  cardiac catheterization in 2009 , June 2013, August 2019 moderate LAD, diagonal and RCA disease, Obesity,  diabetes,  hyperlipidemia with statin intolerance, on praluent obstructive sleep apnea on CPAP,  Tachycardia episodes,   leg edema, lymphedema compression pumps seen in the hospital for severe hypertension and chest pain,  EF 60% in 07/2016 Loop monitor in place for syncope syncope with prolonged bradycardia more than 30 seconds.  There is at least 13 seconds of asystole with occasional scattered PVCs. Pacer, followed by Dr. Caryl Comes 02/2019  recurrent syncope assoc with nausea and vomiting- who presents for routine followup of her coronary artery disease and diastolic CHF  Last clinic visit March 2021 At that time was having severe back pain, concern for needing surgery L2-L3 Weight trending upwards  Previously reported having arrhythmia when getting epidural, case was canceled  Chronic chest pain, chronic fatigue  Denies any recurrent syncope with her nausea and vomiting episodes as she had back in April 2020  Back surgery complications, records reviewed, Hardware taken out , Has picc line  Treating team stopped atenolol, placed her on metoprolol Started getting diarrhea Stopped it on her own, Wants to restart atenolol 12.5 daily  Previously on teorsemide 40 mg daily Now taking 20 mg daily  More surgery on  Monday Weight down from 222 to 214  Has had 2 units of blood  No recent trips to the emergency room for chest pain Last April 2020 No better with nitro  Lab work reviewed HCT 28 Sed rate 68   EKG personally reviewed by myself on todays visit Shows NSR rate 91 bpm no ST or T wave changes  Other past medical hx reviewed 05/16/2018 left arm pain, etiology unclear Went to the ER, Work up negative, potassium 3.2 NTG did not help Better with toradol  Cath: 10/01/2017 1. Relatively small LAD with moderate mid vessel disease. 2. Small D1 with 80% ostial/proximal stenosis. 3. Large, dominant LCx without significant disease. 4. Small, non-dominant RCA. 5. Moderately elevated LVEDP. RECOMMENDATIONS: 1. Medical therapy for LAD/diagonal disease.  syncope atrial tachycardia with documented Pauses. It was presumed that both events occurred while she was sleeping. --loop monitor placed  Hospitalization for chest pain July 17, 2017 Stress test showing no ischemia, ejection fraction 85% Significant failure to thrive/weakness  Episode of pyelonephritis May 2018 Back in the hospital June 0998 with diastolic CHF, shortness of breath Also with bronchitis requiring steroids [place] was at the beach eating out every night, had significant weight gain and was not taking Lasix  Had back surgery 11/14/2015 Went to rehab Got postop infection, ecoli, sepsis crp 30, sed rate 130 Temp in October 2017 Drain placed 02/15/2016, rare ecoli on ABX, picc line in place Still with significant back pain No fevers,  Insulin has been increased.   Prior CV studies:  The following studies were reviewed today:   cardiac catheterization 07/25/2011 for chest pain  This showed left dominant coronary system with moderate mid LAD, proximal diagonal #1 and proximal RCA disease all estimated at 50%, normal LV systolic function.   Past Medical History:  Diagnosis Date  . Anxiety   . Arthritis   . CHF  (congestive heart failure) ([place])   . Chronic back pain    stenosis.degenerative disc,some scoliosis  . Constipation    takes Stool Softener daily  . Coronary artery disease   . Depression    takes Cymbalta daily  . Diabetes mellitus    Type 2 diabetic. Average fasting blood sugar runs high 170-200  . Diastolic CHF ([place]) 82/50/5397   Per patient, diagnosed in 2018  . Diverticulosis   . E coli infection   . GERD (gastroesophageal reflux disease)    takes Nexium daily  . Headache   . Hemorrhoids   . History of colon polyps    benign  . History of gout    doesn't take any meds  . History of hiatal hernia   . History of vertigo    doesn't take any meds  . Hyperlipidemia    takes Praluent daily  . Hypertension    currently BP medications are on hold   . Hypothyroidism    takes Synthroid daily  . Insomnia    takes Restoril nightly  . Muscle spasm    takes Robaxin as needed  . NSVT (nonsustained ventricular tachycardia) ([place])   . OSA on CPAP   . Peripheral vascular disease ([place])    AAA as stated per pt / was just discovered and pt states has not been referred to vascular MD   . Restless leg    takes Requip daily  . Rosacea   . Varicose veins    Past Surgical History:  Procedure Laterality Date  . ABDOMINAL HYSTERECTOMY     with BSo  . CARDIAC CATHETERIZATION  2013   Normal  . CARPAL TUNNEL RELEASE Bilateral   . CATARACT EXTRACTION, BILATERAL    . CHOLECYSTECTOMY    . COLONOSCOPY    . COLONOSCOPY WITH PROPOFOL N/A 11/25/2018   Procedure: COLONOSCOPY WITH PROPOFOL;  Surgeon: Toledo, Benay Pike, MD;  Location: [facility] ENDOSCOPY;  Service: Gastroenterology;  Laterality: N/A;  . DIAGNOSTIC LAPAROSCOPY     multiple times  . DILATION AND CURETTAGE OF UTERUS    . ESOPHAGOGASTRODUODENOSCOPY (EGD) WITH PROPOFOL N/A 11/01/2014   Procedure: ESOPHAGOGASTRODUODENOSCOPY (EGD) WITH PROPOFOL;  Surgeon: Hulen Luster, MD;  Location: [facility] ENDOSCOPY;  Service: Gastroenterology;  Laterality:  N/A;  . ESOPHAGOGASTRODUODENOSCOPY (EGD) WITH PROPOFOL N/A 11/25/2018   Procedure: ESOPHAGOGASTRODUODENOSCOPY (EGD) WITH PROPOFOL;  Surgeon: Toledo, Benay Pike, MD;  Location: [facility] ENDOSCOPY;  Service: Gastroenterology;  Laterality: N/A;  . HARDWARE REMOVAL Left 10/11/2016   Procedure: HARDWARE REMOVAL-LEFT RADIUS;  Surgeon: Lovell Sheehan, MD;  Location: [facility] ORS;  Service: Orthopedics;  Laterality: Left;  Left Radius Wrist   . IMPLANTABLE CONTACT LENS IMPLANTATION     bilateral  . LAMINECTOMY  11/13/2015  . LEFT HEART CATH AND CORONARY ANGIOGRAPHY Left 10/01/2017   Procedure: LEFT HEART CATH AND CORONARY ANGIOGRAPHY;  Surgeon: Nelva Bush, MD;  Location: [place] CV LAB;  Service: Cardiovascular;  Laterality: Left;  . LOOP RECORDER INSERTION N/A 10/17/2017   Procedure: LOOP RECORDER INSERTION;  Surgeon: Deboraha Sprang, MD;  Location: [place] CV LAB;  Service: Cardiovascular;  Laterality: N/A;  . LOOP RECORDER REMOVAL N/A  03/04/2019   Procedure: LOOP RECORDER REMOVAL;  Surgeon: Deboraha Sprang, MD;  Location: [place] CV LAB;  Service: Cardiovascular;  Laterality: N/A;  . LUMBAR FUSION  11/2015  . LUMBAR WOUND DEBRIDEMENT N/A 12/02/2015   Procedure: WOUND Exploration;  Surgeon: Consuella Lose, MD;  Location: [place];  Service: Neurosurgery;  Laterality: N/A;  . OPEN REDUCTION INTERNAL FIXATION (ORIF) DISTAL RADIAL FRACTURE Left 08/29/2016   Procedure: OPEN REDUCTION INTERNAL FIXATION (ORIF) DISTAL RADIAL FRACTURE;  Surgeon: Lovell Sheehan, MD;  Location: [facility] ORS;  Service: Orthopedics;  Laterality: Left;  . PACEMAKER IMPLANT N/A 03/04/2019   Procedure: PACEMAKER IMPLANT;  Surgeon: Deboraha Sprang, MD;  Location: [place] CV [facility];  Service: Cardiovascular;  Laterality: N/A;  . PICC LINE PLACE PERIPHERAL ([place] HX)     right upper arm   . ROTATOR CUFF REPAIR Left   . SAVORY DILATION N/A 11/01/2014   Procedure: SAVORY DILATION;  Surgeon: Hulen Luster, MD;  [facility]  ENDOSCOPY;  Service: Gastroenterology;  Laterality: N/A;  . TONSILLECTOMY    . TRIGGER FINGER RELEASE Bilateral      Current Meds  Medication Sig  . Acetaminophen (TYLENOL ARTHRITIS PAIN PO) Take by mouth as needed.  [name] albuterol (PROVENTIL HFA;VENTOLIN HFA) 108 (90 Base) MCG/ACT inhaler Inhale 2 puffs into the lungs every 6 (six) hours as needed for wheezing or shortness of breath.  . Alirocumab (PRALUENT) 150 MG/ML SOAJ Inject 1 pen into the skin every 14 (fourteen) days.  [name] aspirin 81 MG tablet Take 81 mg by mouth at bedtime.   . Azelaic Acid (FINACEA) 15 % FOAM Apply 1 application topically 2 (two) times daily.   [name] azelastine (ASTELIN) 0.1 % nasal spray Place 2 sprays into both nostrils at bedtime as needed for rhinitis.   . Cholecalciferol (VITAMIN D) 2000 units tablet Take 2,000 Units by mouth daily.  . diazepam (VALIUM) 5 MG tablet TAKE 1 TO 2 TABLETS 30 MINUTES BEFORE ESI  . ertapenem (INVANZ) 1 g injection Inject 1 g into the vein daily.   [name] esomeprazole (NEXIUM) 40 MG capsule Take 40 mg by mouth 2 (two) times daily.  [name] gabapentin (NEURONTIN) 300 MG capsule Take 300 mg by mouth 2 (two) times daily.  [name] HYDROcodone-acetaminophen (NORCO) 10-325 MG tablet SMARTSIG:0.5-1.5 Tablet(s) By Mouth Every 4 Hours PRN  . insulin aspart (NOVOLOG) 100 UNIT/ML FlexPen Inject 10-16 Units into the skin See admin instructions. 10 units with breakfast, 10 units with lunch, 16 units at dinner, >200 increase by 2 units.  . insulin glargine (LANTUS) 100 UNIT/ML injection Inject 30 Units into the skin daily.   [name] ipratropium-albuterol (DUONEB) 0.5-2.5 (3) MG/3ML SOLN Take 3 mLs by nebulization every 6 (six) hours as needed. (Patient taking differently: Take 3 mLs by nebulization every 6 (six) hours as needed (wheezing/shortness of breath). )  . levothyroxine (SYNTHROID, LEVOTHROID) 75 MCG tablet Take 75 mcg by mouth daily before breakfast.   . methocarbamol (ROBAXIN) 500 MG tablet Take 1,000 mg by mouth 4  (four) times daily.  . methocarbamol (ROBAXIN) 750 MG tablet Take 375 mg by mouth 3 (three) times daily as needed for muscle spasms.   . Multiple Vitamin (MULTI-VITAMIN) tablet Take 1 tablet by mouth daily.   . nitroGLYCERIN (NITROSTAT) 0.4 MG SL tablet Place 1 tablet (0.4 mg total) under the tongue every 5 (five) minutes as needed for chest pain.  [name] ondansetron (ZOFRAN) 4 MG tablet Take 4 mg by mouth every 8 (eight) hours as needed  for nausea or vomiting.   . potassium chloride SA (KLOR-CON) 20 MEQ tablet Take 2 tablets (40 mEq total) by mouth 2 (two) times daily. (Patient taking differently: Take 20 mEq by mouth daily. )  . ranolazine (RANEXA) 1000 MG SR tablet TAKE 1 TABLET TWICE A DAY (Patient taking differently: Take 500 mg by mouth 2 (two) times daily. )  . ropinirole (REQUIP) 5 MG tablet Take 5 mg by mouth 2 (two) times daily.  . sertraline (ZOLOFT) 50 MG tablet Take 50 mg by mouth daily.   . temazepam (RESTORIL) 30 MG capsule Take 30 mg by mouth at bedtime.   . torsemide (DEMADEX) 20 MG tablet Take 2 tablets (40 mg total) by mouth 2 (two) times daily. (Patient taking differently: Take 20 mg by mouth daily. )  . traMADol (ULTRAM) 50 MG tablet Take 50 mg by mouth daily as needed for moderate pain or severe pain.   . VEGETABLE LAX+STOOL SOFTENER 8.6-50 MG tablet Take 1 tablet by mouth daily as needed for mild constipation.     Allergies:   Ezetimibe, Metoclopramide, Other, Propoxyphene, Statins, Tramadol, Ceftin [cefuroxime axetil], Codeine, Penicillins, and Vicodin [hydrocodone-acetaminophen]   Social History   Tobacco Use  . Smoking status: Never Smoker  . Smokeless tobacco: Never Used  Vaping Use  . Vaping Use: Never used  Substance Use Topics  . Alcohol use: No  . Drug use: No     Family Hx: The patient's family history includes Breast cancer (age of onset: 33) in her mother; Heart attack in her sister; Heart disease in her brother, father, mother, and sister.  ROS:   Please  see the history of present illness.    Review of Systems  Constitutional: Negative.   HENT: Negative.   Respiratory: Negative.   Cardiovascular: Negative.   Gastrointestinal: Negative.   Musculoskeletal: Positive for back pain.  Neurological: Negative.   Psychiatric/Behavioral: Negative.   All other systems reviewed and are negative.    Labs/Other Tests and Data Reviewed:    Recent Labs: 03/02/2019: BUN 20; Creatinine, Ser 1.14; Potassium 3.8; Sodium 141 06/08/2019: Hemoglobin 12.2; Platelets 216   Recent Lipid Panel Lab Results  Component Value Date/Time   CHOL 163 07/26/2015 08:02 AM   TRIG 187 (H) 07/26/2015 08:02 AM   HDL 32 (L) 07/26/2015 08:02 AM   CHOLHDL 5.1 (H) 07/26/2015 08:02 AM   CHOLHDL 3.8 Ratio 06/10/2008 10:32 PM   LDLCALC 94 07/26/2015 08:02 AM    Wt Readings from Last 3 Encounters:  09/11/19 (!) 213 lb (96.6 kg)  06/16/19 214 lb 2 oz (97.1 kg)  06/08/19 214 lb (97.1 kg)     Exam:    Vital Signs: Vital signs may also be detailed in the HPI BP (!) 140/58 (BP Location: Left Arm, Patient Position: Sitting, Cuff Size: Normal)   Pulse 91   Ht 5\' 2"  (1.575 m)   Wt (!) 213 lb (96.6 kg)   SpO2 97%   BMI 38.96 kg/m    Constitutional:  oriented to person, place, and time. No distress.  Presenting in a wheelchair HENT:  Head: Grossly normal Eyes:  no discharge. No scleral icterus.  Neck: No JVD, no carotid bruits  Cardiovascular: Regular rate and rhythm, no murmurs appreciated Pulmonary/Chest: Clear to auscultation bilaterally, no wheezes or rails Abdominal: Soft.  no distension.  no tenderness.  Musculoskeletal: Normal range of motion Neurological:  normal muscle tone. Coordination normal. No atrophy Skin: Skin warm and dry Psychiatric: normal affect, pleasant  ASSESSMENT & PLAN:    Atherosclerosis of native coronary artery with stable angina pectoris, unspecified whether native or transplanted heart (HCC) No anginal symptoms, restart atenolol  12.5 daily at her request She had diarrhea on metoprolol No further testing needed at this time in preparation for further surgeries next week  Type 2 diabetes mellitus with stage 3 chronic kidney disease, with long-term current use of insulin (HCC) Unable to exercise secondary to back issues Weight trending down, missing meals, sugars improving Diabetes likely playing a role in recent infections of her back  Atrial tachycardia ([place]) Followed by Dr. Caryl Comes Denies any recent symptoms, will restart atenolol at her request, she did not tolerate metoprolol secondary to loose bowel movements  Chronic diastolic heart failure (HCC) Significant swelling on today's visit, only taking torsemide 20 daily Recommend she try torsemide 20 twice daily potassium 20 twice daily through the upcoming weekend in preparation for more surgery next Monday beginning of August, admit of her back where she would likely receive additional fluids -Discussed titration of her diuretics with her in detail  Sinus node dysfunction ([place]) Followed by Dr. Caryl Comes Has pacer As far she is aware no fevers, no signs of infection but will need to be monitored closely  Lymphedema Recommend she use her lymphedema compression pumps, extra Lasix to the weekend in preparation for surgery  Essential hypertension Blood pressure is well controlled on today's visit.  Restart atenolol as above  OSA on CPAP Continues to use her CPAP  Syncope Pacemaker in place Previous episodes in the setting of nausea vomiting, vasovagal    Total encounter time more than 25 minutes  Greater than 50% was spent in counseling and coordination of care with the patient    Signed, Ida Rogue, MD  09/11/2019 8:27 AM    [place] [id number], [place], [place] [address]

## 2019-09-23 ENCOUNTER — Encounter: Payer: Medicare Other | Admitting: Dermatology

## 2019-11-03 ENCOUNTER — Other Ambulatory Visit: Payer: Self-pay | Admitting: Cardiovascular Disease

## 2019-12-02 ENCOUNTER — Ambulatory Visit (INDEPENDENT_AMBULATORY_CARE_PROVIDER_SITE_OTHER): Payer: Medicare Other

## 2019-12-02 DIAGNOSIS — R55 Syncope and collapse: Secondary | ICD-10-CM

## 2019-12-04 LAB — CUP PACEART REMOTE DEVICE CHECK
Date Time Interrogation Session: 20211020112905
Implantable Lead Implant Date: 20210120
Implantable Lead Implant Date: 20210120
Implantable Lead Location: 753859
Implantable Lead Location: 753860
Implantable Lead Model: 5076
Implantable Lead Model: 5076
Implantable Pulse Generator Implant Date: 20210120
Pulse Gen Model: 407145
Pulse Gen Serial Number: 69765687

## 2019-12-08 NOTE — Progress Notes (Signed)
Remote pacemaker transmission.   

## 2020-01-08 ENCOUNTER — Other Ambulatory Visit: Payer: Self-pay | Admitting: Cardiovascular Disease

## 2020-03-02 ENCOUNTER — Ambulatory Visit (INDEPENDENT_AMBULATORY_CARE_PROVIDER_SITE_OTHER): Payer: Medicare Other

## 2020-03-02 DIAGNOSIS — R55 Syncope and collapse: Secondary | ICD-10-CM | POA: Diagnosis not present

## 2020-03-03 LAB — CUP PACEART REMOTE DEVICE CHECK
Battery Remaining Percentage: 90 %
Brady Statistic RA Percent Paced: 22 %
Brady Statistic RV Percent Paced: 0 %
Date Time Interrogation Session: 20220119065425
Implantable Lead Implant Date: 20210120
Implantable Lead Implant Date: 20210120
Implantable Lead Location: 753859
Implantable Lead Location: 753860
Implantable Lead Model: 5076
Implantable Lead Model: 5076
Implantable Pulse Generator Implant Date: 20210120
Lead Channel Impedance Value: 371 Ohm
Lead Channel Impedance Value: 761 Ohm
Lead Channel Pacing Threshold Amplitude: 0.4 V
Lead Channel Pacing Threshold Amplitude: 0.6 V
Lead Channel Pacing Threshold Pulse Width: 0.4 ms
Lead Channel Pacing Threshold Pulse Width: 0.4 ms
Lead Channel Sensing Intrinsic Amplitude: 2.8 mV
Lead Channel Sensing Intrinsic Amplitude: 7.8 mV
Lead Channel Setting Pacing Amplitude: 3 V
Lead Channel Setting Pacing Amplitude: 3 V
Lead Channel Setting Pacing Pulse Width: 0.4 ms
Pulse Gen Model: 407145
Pulse Gen Serial Number: 69765687

## 2020-03-10 ENCOUNTER — Other Ambulatory Visit: Payer: Self-pay | Admitting: Internal Medicine

## 2020-03-10 DIAGNOSIS — Z1231 Encounter for screening mammogram for malignant neoplasm of breast: Secondary | ICD-10-CM

## 2020-03-13 NOTE — Progress Notes (Unsigned)
Date:  03/14/2020   ID:  [name], DOB March 24, 1945, MRN [id number]  Patient Location:  [place] [address]   Provider location:   [facility], [place] office  PCP:  Ana Aus, MD  Cardiologist:  [name] [facility]   Chief Complaint  Patient presents with  . Follow-up    6 Months follow up. Medications verbally reviewed with patient.     History of Present Illness:    [name] is a 75 y.o. female  past medical history of CAD,  cardiac catheterization in 2009 , June 2013, August 2019 moderate LAD, diagonal and RCA disease, Obesity,  diabetes,  hyperlipidemia with statin intolerance, on praluent obstructive sleep apnea on CPAP,  Tachycardia episodes,   leg edema, lymphedema compression pumps seen in the hospital for severe hypertension and chest pain,  EF 60% in 07/2016 Loop monitor in place for syncope syncope with prolonged bradycardia more than 30 seconds.  There is at least 13 seconds of asystole with occasional scattered PVCs. Pacer, followed by Dr. Caryl Castaneda 02/2019  recurrent syncope assoc with nausea and vomiting- Back surgeries 2017, 2021, both with postoperative infection who presents for routine followup of her coronary artery disease and diastolic CHF  Last clinic visit July 2021 severe back pain Chronic chest pain, chronic fatigue Back surgery complications, Surgeries done at [facility] taken out , Had picc line Atenolol changed to metoprolol, did not tolerate, no diarrhea, changed back Weight loss from surgery, Has had 2 units of blood  Denies any recurrent syncope with her nausea and vomiting episodes as she had back in April 2020  Feels euvolemic, denies shortness of breath  torsemide 20 mg daily, up to 40 mg Minimal swelling  Healed since sept 2021, feeling better Wound vac gone  On repatha q2 weeks Cholesterol slightly elevated, does not want to change medications at this  time  Pacer downloads reviewed,  stable  rare chest pain, One episode, months ago, possibly when she was anemic Took ranexa (as needed) No recurrent symptoms  Labs reviewed with her on today's visit HGBA1C 6.6 HGB 10.9 CR 1.3 Potassium 4.1  EKG personally reviewed by myself on todays visit Shows NSR rate 81 bpm no ST or T wave changes  Other past medical hx reviewed 05/16/2018 left arm pain, etiology unclear Went to the ER, Work up negative  Cath: 10/01/2017 1. Relatively small LAD with moderate mid vessel disease. 2. Small D1 with 80% ostial/proximal stenosis. 3. Large, dominant LCx without significant disease. 4. Small, non-dominant RCA. 5. Moderately elevated LVEDP. RECOMMENDATIONS: 1. Medical therapy for LAD/diagonal disease.  syncope atrial tachycardia with documented Pauses. It was presumed that both events occurred while she was sleeping. --loop monitor placed  Hospitalization for chest pain July 17, 2017 Stress test showing no ischemia, ejection fraction 85% Significant failure to thrive/weakness  Had back surgery 11/14/2015 Went to rehab Got postop infection, ecoli, sepsis   Prior CV studies:   The following studies were reviewed today:   cardiac catheterization 07/25/2011 for chest pain  This showed left dominant coronary system with moderate mid LAD, proximal diagonal #1 and proximal RCA disease all estimated at 50%, normal LV systolic function.   Past Medical History:  Diagnosis Date  . Anxiety   . Arthritis   . CHF (congestive heart failure) ([place])   . Chronic back pain    stenosis.degenerative disc,some scoliosis  . Constipation    takes Stool  Softener daily  . Coronary artery disease   . Depression    takes Cymbalta daily  . Diabetes mellitus    Type 2 diabetic. Average fasting blood sugar runs high 170-200  . Diastolic CHF ([place]) [id number]   Per patient, diagnosed in 2018  . Diverticulosis   . E coli infection   . GERD  (gastroesophageal reflux disease)    takes Nexium daily  . Headache   . Hemorrhoids   . History of colon polyps    benign  . History of gout    doesn't take any meds  . History of hiatal hernia   . History of vertigo    doesn't take any meds  . Hyperlipidemia    takes Praluent daily  . Hypertension    currently BP medications are on hold   . Hypothyroidism    takes Synthroid daily  . Insomnia    takes Restoril nightly  . Muscle spasm    takes Robaxin as needed  . NSVT (nonsustained ventricular tachycardia) ([place])   . OSA on CPAP   . Peripheral vascular disease ([place])    AAA as stated per pt / was just discovered and pt states has not been referred to vascular MD   . Restless leg    takes Requip daily  . Rosacea   . Varicose veins    Past Surgical History:  Procedure Laterality Date  . ABDOMINAL HYSTERECTOMY     with BSo  . CARDIAC CATHETERIZATION  2013   Normal  . CARPAL TUNNEL RELEASE Bilateral   . CATARACT EXTRACTION, BILATERAL    . CHOLECYSTECTOMY    . COLONOSCOPY    . COLONOSCOPY WITH PROPOFOL N/A 11/25/2018   Procedure: COLONOSCOPY WITH PROPOFOL;  Surgeon: [name], Ana Pike, MD;  Location: [facility] ENDOSCOPY;  Service: Gastroenterology;  Laterality: N/A;  . DIAGNOSTIC LAPAROSCOPY     multiple times  . DILATION AND CURETTAGE OF UTERUS    . ESOPHAGOGASTRODUODENOSCOPY (EGD) WITH PROPOFOL N/A 11/01/2014   Procedure: ESOPHAGOGASTRODUODENOSCOPY (EGD) WITH PROPOFOL;  Surgeon: Ana Luster, MD;  Location: [facility] ENDOSCOPY;  Service: Gastroenterology;  Laterality: N/A;  . ESOPHAGOGASTRODUODENOSCOPY (EGD) WITH PROPOFOL N/A 11/25/2018   Procedure: ESOPHAGOGASTRODUODENOSCOPY (EGD) WITH PROPOFOL;  Surgeon: [name], Ana Pike, MD;  Location: [facility] ENDOSCOPY;  Service: Gastroenterology;  Laterality: N/A;  . HARDWARE REMOVAL Left 10/11/2016   Procedure: HARDWARE REMOVAL-LEFT RADIUS;  Surgeon: Ana Sheehan, MD;  Location: [facility] ORS;  Service: Orthopedics;  Laterality: Left;  Left Radius  Wrist   . IMPLANTABLE CONTACT LENS IMPLANTATION     bilateral  . LAMINECTOMY  11/13/2015  . LEFT HEART CATH AND CORONARY ANGIOGRAPHY Left 10/01/2017   Procedure: LEFT HEART CATH AND CORONARY ANGIOGRAPHY;  Surgeon: Ana Bush, MD;  Location: [place] CV LAB;  Service: Cardiovascular;  Laterality: Left;  . LOOP RECORDER INSERTION N/A 10/17/2017   Procedure: LOOP RECORDER INSERTION;  Surgeon: Deboraha Sprang, MD;  Location: [place] CV LAB;  Service: Cardiovascular;  Laterality: N/A;  . LOOP RECORDER REMOVAL N/A 03/04/2019   Procedure: LOOP RECORDER REMOVAL;  Surgeon: Deboraha Sprang, MD;  Location: [place] CV LAB;  Service: Cardiovascular;  Laterality: N/A;  . LUMBAR FUSION  11/2015  . LUMBAR WOUND DEBRIDEMENT N/A 12/02/2015   Procedure: WOUND Exploration;  Surgeon: Consuella Lose, MD;  Location: [place];  Service: Neurosurgery;  Laterality: N/A;  . OPEN REDUCTION INTERNAL FIXATION (ORIF) DISTAL RADIAL FRACTURE Left 08/29/2016   Procedure: OPEN REDUCTION INTERNAL FIXATION (ORIF) DISTAL RADIAL FRACTURE;  Surgeon:  Ana Sheehan, MD;  Location: [facility] ORS;  Service: Orthopedics;  Laterality: Left;  . PACEMAKER IMPLANT N/A 03/04/2019   Procedure: PACEMAKER IMPLANT;  Surgeon: Deboraha Sprang, MD;  Location: [place] CV LAB;  Service: Cardiovascular;  Laterality: N/A;  . PICC LINE PLACE PERIPHERAL ([place] [facility]X)     Castaneda upper arm   . ROTATOR CUFF REPAIR Left   . SAVORY DILATION N/A 11/01/2014   Procedure: SAVORY DILATION;  Surgeon: Ana Luster, MD;  Location[facility] ENDOSCOPY;  Service: Gastroenterology;  Laterality: N/A;  . TONSILLECTOMY    . TRIGGER FINGER RELEASE Bilateral      Current Meds  Medication Sig  . Acetaminophen (TYLENOL ARTHRITIS PAIN PO) Take by mouth as needed.  [name] albuterol (PROVENTIL HFA;VENTOLIN HFA) 108 (90 Base) MCG/ACT inhaler Inhale 2 puffs into the lungs every 6 (six) hours as needed for wheezing or shortness of breath.  [name] aspirin 81 MG tablet Take 81 mg by  mouth at bedtime.   [name] atenolol (TENORMIN) 25 MG tablet Take 0.5 tablets (12.5 mg total) by mouth daily.  . Azelaic Acid (FINACEA) 15 % FOAM Apply 1 application topically 2 (two) times daily.   [name] azelastine (ASTELIN) 0.1 % nasal spray Place 2 sprays into both nostrils at bedtime as needed for rhinitis.   . Cholecalciferol (VITAMIN D) 2000 units tablet Take 2,000 Units by mouth daily.  [name] esomeprazole (NEXIUM) 40 MG capsule Take 40 mg by mouth 2 (two) times daily.  [name] gabapentin (NEURONTIN) 300 MG capsule Take 300 mg by mouth 2 (two) times daily.  . insulin aspart (NOVOLOG) 100 UNIT/ML FlexPen Inject 10-16 Units into the skin See admin instructions. 10 units with breakfast, 10 units with lunch, 16 units at dinner, >200 increase by 2 units.  . insulin glargine (LANTUS) 100 UNIT/ML injection Inject 30 Units into the skin daily.   [name] ipratropium-albuterol (DUONEB) 0.5-2.5 (3) MG/3ML SOLN Take 3 mLs by nebulization every 6 (six) hours as needed. (Patient taking differently: Take 3 mLs by nebulization every 6 (six) hours as needed (wheezing/shortness of breath).)  . levothyroxine (SYNTHROID, LEVOTHROID) 75 MCG tablet Take 75 mcg by mouth daily before breakfast.   . linagliptin (TRADJENTA) 5 MG TABS tablet Take by mouth.  . methocarbamol (ROBAXIN) 750 MG tablet Take 375 mg by mouth 3 (three) times daily as needed for muscle spasms.   . nitroGLYCERIN (NITROSTAT) 0.4 MG SL tablet Place 1 tablet (0.4 mg total) under the tongue every 5 (five) minutes as needed for chest pain.  [name] ondansetron (ZOFRAN) 4 MG tablet Take 4 mg by mouth every 8 (eight) hours as needed for nausea or vomiting.   . potassium chloride SA (KLOR-CON) 20 MEQ tablet Take 2 tablets (40 mEq total) by mouth 2 (two) times daily. (Patient taking differently: Take 20 mEq by mouth daily.)  . PRALUENT 150 MG/ML SOAJ INJECT 1 PEN UNDER THE SKIN EVERY 14 DAYS  . ranolazine (RANEXA) 1000 MG SR tablet TAKE 1 TABLET TWICE A DAY (Patient taking differently:  Take 500 mg by mouth 2 (two) times daily.)  . ropinirole (REQUIP) 5 MG tablet Take 5 mg by mouth 2 (two) times daily.  . temazepam (RESTORIL) 30 MG capsule Take 30 mg by mouth at bedtime.  . torsemide (DEMADEX) 20 MG tablet Take 2 tablets (40 mg total) by mouth 2 (two) times daily. (Patient taking differently: Take 20 mg by mouth daily.)  . traMADol (ULTRAM) 50 MG tablet Take 50 mg by mouth daily as needed  for moderate pain or severe pain.   . VEGETABLE LAX+STOOL SOFTENER 8.6-50 MG tablet Take 1 tablet by mouth daily as needed for mild constipation.     Allergies:   Ezetimibe, Metoclopramide, Other, Propoxyphene, Statins, Tramadol, Ceftin [cefuroxime axetil], Codeine, Penicillins, and Vicodin [hydrocodone-acetaminophen]   Social History   Tobacco Use  . Smoking status: Never Smoker  . Smokeless tobacco: Never Used  Vaping Use  . Vaping Use: Never used  Substance Use Topics  . Alcohol use: No  . Drug use: No     Family Hx: The patient's family history includes Breast cancer (age of onset: 79) in her mother; Heart attack in her sister; Heart disease in her brother, father, mother, and sister.  ROS:   Please see the history of present illness.    Review of Systems  Constitutional: Negative.   HENT: Negative.   Respiratory: Negative.   Cardiovascular: Negative.   Gastrointestinal: Negative.   Musculoskeletal: Positive for back pain.  Neurological: Negative.   Psychiatric/Behavioral: Negative.   All other systems reviewed and are negative.    Labs/Other Tests and Data Reviewed:    Recent Labs: 06/08/2019: Hemoglobin 12.2; Platelets 216   Recent Lipid Panel Lab Results  Component Value Date/Time   CHOL 163 07/26/2015 08:02 AM   TRIG 187 (H) 07/26/2015 08:02 AM   HDL 32 (L) 07/26/2015 08:02 AM   CHOLHDL 5.1 (H) 07/26/2015 08:02 AM   CHOLHDL 3.8 Ratio 06/10/2008 10:32 PM   LDLCALC 94 07/26/2015 08:02 AM    Wt Readings from Last 3 Encounters:  03/14/20 203 lb (92.1 kg)   09/11/19 (!) 213 lb (96.6 kg)  06/16/19 214 lb 2 oz (97.1 kg)     Exam:    Vital Signs: Vital signs may also be detailed in the HPI BP 130/60 (BP Location: Left Arm, Patient Position: Sitting, Cuff Size: Normal)   Pulse 81   Ht 5\' 2"  (1.575 m)   Wt 203 lb (92.1 kg)   SpO2 95%   BMI 37.13 kg/m    Constitutional:  oriented to person, place, and time. No distress.  HENT:  Head: Grossly normal Eyes:  no discharge. No scleral icterus.  Neck: No JVD, no carotid bruits  Cardiovascular: Regular rate and rhythm, no murmurs appreciated Pulmonary/Chest: Clear to auscultation bilaterally, no wheezes or rails Abdominal: Soft.  no distension.  no tenderness.  Musculoskeletal: Normal range of motion Neurological:  normal muscle tone. Coordination normal. No atrophy Skin: Skin warm and dry Psychiatric: normal affect, pleasant   ASSESSMENT & PLAN:    Atherosclerosis of native coronary artery with stable angina pectoris, unspecified whether native or transplanted heart [facility]) Currently with no symptoms of angina. No further workup at this time. Continue current medication regimen.  Type 2 diabetes mellitus with stage 3 chronic kidney disease, with long-term current use of insulin (H[facility]) Negative exercise, knee problem  weight lower Calorie restriction recommended  Atrial tachycardia ([place]) Followed by Dr. Therisa Doyne downloads reviewed Has follow-up in 1 month with EP Normal rhythm on today's visit  Chronic diastolic heart failure ([place]) Appears euvolemic on torsemide 20-40, renal function stable, electrolytes stable  Sinus node dysfunction ([place]) Followed by Dr. Caryl Castaneda Has pacer  Lymphedema Previously required compression pumps, symptoms are stable on torsemide, weight loss  Essential hypertension Blood pressure is well controlled on today's visit. No changes made to the medications.   OSA on CPAP Continues to use her CPAP  Syncope Pacemaker in place Previous episodes in  the setting of  nausea vomiting, vasovagal No recurrent symptoms    Total encounter time more than 25 minutes  Greater than 50% was spent in counseling and coordination of care with the patient    Signed, Ida Rogue, MD  03/14/2020 10:59 AM    [id number], [place], [place] [address]

## 2020-03-14 ENCOUNTER — Other Ambulatory Visit: Payer: Self-pay

## 2020-03-14 ENCOUNTER — Encounter: Payer: Self-pay | Admitting: Cardiovascular Disease

## 2020-03-14 ENCOUNTER — Ambulatory Visit (INDEPENDENT_AMBULATORY_CARE_PROVIDER_SITE_OTHER): Payer: Medicare Other | Admitting: Cardiovascular Disease

## 2020-03-14 VITALS — BP 130/60 | HR 81 | Ht 62.0 in | Wt 203.0 lb

## 2020-03-14 DIAGNOSIS — Z950 Presence of cardiac pacemaker: Secondary | ICD-10-CM

## 2020-03-14 DIAGNOSIS — I471 Supraventricular tachycardia: Secondary | ICD-10-CM

## 2020-03-14 DIAGNOSIS — I10 Essential (primary) hypertension: Secondary | ICD-10-CM | POA: Diagnosis not present

## 2020-03-14 DIAGNOSIS — N1832 Chronic kidney disease, stage 3b: Secondary | ICD-10-CM

## 2020-03-14 DIAGNOSIS — I5032 Chronic diastolic (congestive) heart failure: Secondary | ICD-10-CM | POA: Diagnosis not present

## 2020-03-14 DIAGNOSIS — E1159 Type 2 diabetes mellitus with other circulatory complications: Secondary | ICD-10-CM | POA: Diagnosis not present

## 2020-03-14 DIAGNOSIS — Z794 Long term (current) use of insulin: Secondary | ICD-10-CM

## 2020-03-14 DIAGNOSIS — E1122 Type 2 diabetes mellitus with diabetic chronic kidney disease: Secondary | ICD-10-CM

## 2020-03-14 NOTE — Progress Notes (Signed)
Remote pacemaker transmission.   

## 2020-03-14 NOTE — Patient Instructions (Addendum)
Medication Instructions:  No changes  If you need a refill on your cardiac medications before your next appointment, please call your pharmacy.    Lab work: No new labs needed   If you have labs (blood work) drawn today and your tests are completely normal, you will receive your results only by: . MyChart Message (if you have MyChart) OR . A paper copy in the mail If you have any lab test that is abnormal or we need to change your treatment, we will call you to review the results.   Testing/Procedures: No new testing needed   Follow-Up: At [facility], you and your health needs are our priority.  As part of our continuing mission to provide you with exceptional heart care, we have created designated Provider Care Teams.  These Care Teams include your primary Cardiologist (physician) and Advanced Practice Providers (APPs -  Physician Assistants and Nurse Practitioners) who all work together to provide you with the care you need, when you need it.  . You will need a follow up appointment in 7 months  . Providers on your designated Care Team:   . Christopher Berge, NP . Ryan Dunn, PA-C . Jacquelyn Visser, PA-C  Any Other Special Instructions Will Be Listed Below (If Applicable).  COVID-19 Vaccine Information can be found at: [email or web address] For questions related to vaccine distribution or appointments, please email [email or web address] or call [phone].     

## 2020-03-17 ENCOUNTER — Telehealth: Payer: Self-pay | Admitting: Internal Medicine

## 2020-03-17 NOTE — Telephone Encounter (Signed)
error 

## 2020-03-28 ENCOUNTER — Other Ambulatory Visit: Payer: Self-pay

## 2020-03-28 ENCOUNTER — Ambulatory Visit
Admission: RE | Admit: 2020-03-28 | Discharge: 2020-03-28 | Disposition: A | Discharge status: Home / Self Care | Payer: Medicare Other | Source: Ambulatory Visit | Attending: Internal Medicine | Admitting: Internal Medicine

## 2020-03-28 DIAGNOSIS — Z1231 Encounter for screening mammogram for malignant neoplasm of breast: Secondary | ICD-10-CM | POA: Insufficient documentation

## 2020-03-31 ENCOUNTER — Other Ambulatory Visit: Payer: Self-pay | Admitting: Cardiovascular Disease

## 2020-04-05 ENCOUNTER — Encounter: Payer: Self-pay | Admitting: Internal Medicine

## 2020-04-05 ENCOUNTER — Other Ambulatory Visit: Payer: Self-pay

## 2020-04-05 ENCOUNTER — Ambulatory Visit (INDEPENDENT_AMBULATORY_CARE_PROVIDER_SITE_OTHER): Payer: Medicare Other | Admitting: Internal Medicine

## 2020-04-05 DIAGNOSIS — I471 Supraventricular tachycardia: Secondary | ICD-10-CM | POA: Diagnosis not present

## 2020-04-05 LAB — PACEMAKER DEVICE OBSERVATION

## 2020-04-05 NOTE — Progress Notes (Signed)
Patient Care Team: Rusty Aus, MD as PCP - General (Internal Medicine) Minna Merritts, MD as Consulting Physician (Cardiology)   HPI  DORETTA REMMERT is a 75 y.o. female Seen in follow-up for syncope atrial tachycardia with documented pauses.  It was presumed that both events occurred while she was sleeping.  One however is timed at midnight the other at the midday.  The event during the day occurred during nuclear stress testing.    She has subsequently had interval syncope Underwent loop recorder insertion for recurrent syncope 9/19 seen 1/21 for interval syncope demonstrating profound bradycardia more than 30 seconds with 13 seconds of asystole.  She underwent Biotronik pacemaker implantation 1/21.  She also has a history of exertional chest discomfort for which she underwent CT A FINDINGS of significant coronary disease OM FFR was concerning and LAD FFR was not possible and could not rule out severe stenosis>> LHC See Below   she is also struggling with dyspnea on exertion  The patient denies chest pain, chronic but somewhat improving shortness of breath, no nocturnal dyspnea , some peripheral   Edema; diet is salt deplete.  There have been no palpitations and no syncope; 1 interval episode of presyncope  Having undergone pacemaker implant 1/21 she underwent back surgery 8/76 that was complicated by infection requiring a repeat procedure drainage of an abscess and debridement.  Long-term antibiotics.  No evidence of device infection.  Having problems with her knee.  Needs an MRI.  DATE TEST EF   6/19 Echo   55-65 % PA pressure normal  7/19  CTA  FFR < .8 OM and not able to calculate LAD  8/19 LHC  50 %LAD mid/80%D1     Date Cr K Hgb  1/21 1.14 3.8 12.2 (4/21)  1/22  1.3 4.1 11.0      Past Medical History:  Diagnosis Date  . Anxiety   . Arthritis   . CHF (congestive heart failure) ([place])   . Chronic back pain    stenosis.degenerative disc,some scoliosis  .  Constipation    takes Stool Softener daily  . Coronary artery disease   . Depression    takes Cymbalta daily  . Diabetes mellitus    Type 2 diabetic. Average fasting blood sugar runs high 170-200  . Diastolic CHF ([place]) 81/15/7262   Per patient, diagnosed in 2018  . Diverticulosis   . E coli infection   . GERD (gastroesophageal reflux disease)    takes Nexium daily  . Headache   . Hemorrhoids   . History of colon polyps    benign  . History of gout    doesn't take any meds  . History of hiatal hernia   . History of vertigo    doesn't take any meds  . Hyperlipidemia    takes Praluent daily  . Hypertension    currently BP medications are on hold   . Hypothyroidism    takes Synthroid daily  . Insomnia    takes Restoril nightly  . Muscle spasm    takes Robaxin as needed  . NSVT (nonsustained ventricular tachycardia) ([place])   . OSA on CPAP   . Peripheral vascular disease ([place])    AAA as stated per pt / was just discovered and pt states has not been referred to vascular MD   . Restless leg    takes Requip daily  . Rosacea   . Varicose veins     Past Surgical History:  Procedure Laterality Date  . ABDOMINAL HYSTERECTOMY     with BSo  . CARDIAC CATHETERIZATION  2013   Normal  . CARPAL TUNNEL RELEASE Bilateral   . CATARACT EXTRACTION, BILATERAL    . CHOLECYSTECTOMY    . COLONOSCOPY    . COLONOSCOPY WITH PROPOFOL N/A 11/25/2018   Procedure: COLONOSCOPY WITH PROPOFOL;  Surgeon: Toledo, Benay Pike, MD;  Location: [facility] ENDOSCOPY;  Service: Gastroenterology;  Laterality: N/A;  . DIAGNOSTIC LAPAROSCOPY     multiple times  . DILATION AND CURETTAGE OF UTERUS    . ESOPHAGOGASTRODUODENOSCOPY (EGD) WITH PROPOFOL N/A 11/01/2014   Procedure: ESOPHAGOGASTRODUODENOSCOPY (EGD) WITH PROPOFOL;  Surgeon: Hulen Luster, MD;  Location: [facility] ENDOSCOPY;  Service: Gastroenterology;  Laterality: N/A;  . ESOPHAGOGASTRODUODENOSCOPY (EGD) WITH PROPOFOL N/A 11/25/2018   Procedure:  ESOPHAGOGASTRODUODENOSCOPY (EGD) WITH PROPOFOL;  Surgeon: Toledo, Benay Pike, MD;  Location: [facility] ENDOSCOPY;  Service: Gastroenterology;  Laterality: N/A;  . HARDWARE REMOVAL Left 10/11/2016   Procedure: HARDWARE REMOVAL-LEFT RADIUS;  Surgeon: Lovell Sheehan, MD;  Location: [facility] ORS;  Service: Orthopedics;  Laterality: Left;  Left Radius Wrist   . IMPLANTABLE CONTACT LENS IMPLANTATION     bilateral  . LAMINECTOMY  11/13/2015  . LEFT HEART CATH AND CORONARY ANGIOGRAPHY Left 10/01/2017   Procedure: LEFT HEART CATH AND CORONARY ANGIOGRAPHY;  Surgeon: Nelva Bush, MD;  Location: [place] CV LAB;  Service: Cardiovascular;  Laterality: Left;  . LOOP RECORDER INSERTION N/A 10/17/2017   Procedure: LOOP RECORDER INSERTION;  Surgeon: Deboraha Sprang, MD;  Location: [place] CV LAB;  Service: Cardiovascular;  Laterality: N/A;  . LOOP RECORDER REMOVAL N/A 03/04/2019   Procedure: LOOP RECORDER REMOVAL;  Surgeon: Deboraha Sprang, MD;  Location: [place] CV LAB;  Service: Cardiovascular;  Laterality: N/A;  . LUMBAR FUSION  11/2015  . LUMBAR WOUND DEBRIDEMENT N/A 12/02/2015   Procedure: WOUND Exploration;  Surgeon: Consuella Lose, MD;  Location: [place];  Service: Neurosurgery;  Laterality: N/A;  . OPEN REDUCTION INTERNAL FIXATION (ORIF) DISTAL RADIAL FRACTURE Left 08/29/2016   Procedure: OPEN REDUCTION INTERNAL FIXATION (ORIF) DISTAL RADIAL FRACTURE;  Surgeon: Lovell Sheehan, MD;  Location: [facility] ORS;  Service: Orthopedics;  Laterality: Left;  . PACEMAKER IMPLANT N/A 03/04/2019   Procedure: PACEMAKER IMPLANT;  Surgeon: Deboraha Sprang, MD;  Location: [place] CV LAB;  Service: Cardiovascular;  Laterality: N/A;  . PICC LINE PLACE PERIPHERAL ([place] HX)     right upper arm   . ROTATOR CUFF REPAIR Left   . SAVORY DILATION N/A 11/01/2014   Procedure: SAVORY DILATION;  Surgeon: Hulen Luster, MD;  Location: [facility] ENDOSCOPY;  Service: Gastroenterology;  Laterality: N/A;  . TONSILLECTOMY    . TRIGGER  FINGER RELEASE Bilateral     Current Meds  Medication Sig  . Acetaminophen (TYLENOL ARTHRITIS PAIN PO) Take by mouth as needed.  [name] albuterol (PROVENTIL HFA;VENTOLIN HFA) 108 (90 Base) MCG/ACT inhaler Inhale 2 puffs into the lungs every 6 (six) hours as needed for wheezing or shortness of breath.  [name] aspirin 81 MG tablet Take 81 mg by mouth at bedtime.   [name] atenolol (TENORMIN) 25 MG tablet Take 0.5 tablets (12.5 mg total) by mouth daily.  . Azelaic Acid (FINACEA) 15 % FOAM Apply 1 application topically 2 (two) times daily.   [name] azelastine (ASTELIN) 0.1 % nasal spray Place 2 sprays into both nostrils at bedtime as needed for rhinitis.   . Cholecalciferol (VITAMIN D) 2000 units tablet Take 2,000 Units by mouth daily.  [name]  esomeprazole (NEXIUM) 40 MG capsule Take 40 mg by mouth 2 (two) times daily.  [name] gabapentin (NEURONTIN) 300 MG capsule Take 300 mg by mouth 2 (two) times daily.  . insulin aspart (NOVOLOG) 100 UNIT/ML FlexPen Inject 10-16 Units into the skin See admin instructions. 10 units with breakfast, 10 units with lunch, 16 units at dinner, >200 increase by 2 units.  . insulin glargine (LANTUS) 100 UNIT/ML injection Inject 30 Units into the skin daily.   [name] ipratropium-albuterol (DUONEB) 0.5-2.5 (3) MG/3ML SOLN Take 3 mLs by nebulization every 6 (six) hours as needed.  [name] levothyroxine (SYNTHROID, LEVOTHROID) 75 MCG tablet Take 75 mcg by mouth daily before breakfast.   . linagliptin (TRADJENTA) 5 MG TABS tablet Take 5 mg by mouth daily.  . methocarbamol (ROBAXIN) 750 MG tablet Take 375 mg by mouth 3 (three) times daily as needed for muscle spasms.   . nitroGLYCERIN (NITROSTAT) 0.4 MG SL tablet Place 1 tablet (0.4 mg total) under the tongue every 5 (five) minutes as needed for chest pain.  [name] ondansetron (ZOFRAN) 4 MG tablet Take 4 mg by mouth every 8 (eight) hours as needed for nausea or vomiting.   . Potassium Chloride (KLOR-CON PO) Take by mouth. 20mg  tablets 1-2 tablets as directed depending on  amount of torsemide taken daily  . PRALUENT 150 MG/ML SOAJ INJECT 1 PEN UNDER THE SKIN EVERY 14 DAYS  . ranolazine (RANEXA) 500 MG 12 hr tablet Take 500 mg by mouth 2 (two) times daily.  . ropinirole (REQUIP) 5 MG tablet Take 5 mg by mouth 2 (two) times daily.  . temazepam (RESTORIL) 30 MG capsule Take 30 mg by mouth at bedtime as needed for sleep.  [name] torsemide (DEMADEX) 20 MG tablet Takes 1-2 tablets daily as needed  . traMADol (ULTRAM) 50 MG tablet Take 50 mg by mouth daily as needed for moderate pain or severe pain.   . VEGETABLE LAX+STOOL SOFTENER 8.6-50 MG tablet Take 1 tablet by mouth daily as needed for mild constipation.    Allergies  Allergen Reactions  . Ezetimibe Diarrhea and Other (See Comments)    Nausea, diarrhea   . Metoclopramide Diarrhea  . Other Other (See Comments)  . Propoxyphene Other (See Comments)    Unsure of reaction type Unsure of reaction type  . Statins Other (See Comments)    Leg pain  . Tramadol Hives  . Ceftin [Cefuroxime Axetil] Diarrhea  . Codeine Rash       . Penicillins Rash and Other (See Comments)    Has patient had a PCN reaction causing immediate rash, facial/tongue/throat swelling, SOB or lightheadedness with hypotension: NO Has patient had a PCN reaction causing severe rash involving mucus membranes or skin necrosis: NO Has patient had a PCN retioion that required hospitalization NO Has patient had a PCN reaction occurring within the last 10 years: NO If all of the above answers are "NO", then may proceed with Cephalosporin use.  . Vicodin [Hydrocodone-Acetaminophen] Other (See Comments)    passes out      Review of Systems negative except from HPI and PMH  Physical Exam BP (!) 142/61 (BP Location: Left Arm, Patient Position: Sitting, Cuff Size: Large)   Pulse 91   Ht 5\' 2"  (1.575 m)   Wt 206 lb (93.4 kg)   SpO2 98%   BMI 37.68 kg/m  Well developed and well nourished in no acute distress HENT normal Neck supple with  JVP-flat Clear Device pocket well healed; without hematoma or erythema.  There is no tethering  Regular rate and rhythm, no  murmur Abd-soft with active BS No Clubbing cyanosis   edema Skin-warm and dry A & Oriented  Grossly normal sensory and motor function  ECG sinus at 81-interval 15/08/37   Assessment and Plan:  Syncope and exertional lightheadedness with prolonged asystole question neurally mediated  Atrial tachycardia-nonsustained  Pacemaker-Biotronik  Obstructive sleep apnea treated  Obesity  Diastolic heart failure  Coronary artery disease with abnormal FFR>> Cath mod LAD/D1 disease  No interval syncope; episode of presyncope.  Her CLS algorithm is relatively modest.  In the event that she has more presyncope, we will make it more aggressive.  Nonsustained atrial tachycardia.  Still with evidence of diastolic heart failure and volume overload.  We will increase her torsemide to 40 daily x3 days and 40 mg every other day.  She may be a candidate for an SGLT2 inhibitor especially given her diabetes.  Will reach out to her PCP  She needs an MRI for her knee.  Will reach out to [facility] on her behalf to see if we can get it done there as she has a hybrid system with Medtronic leads and a Biotronik generator

## 2020-04-05 NOTE — Patient Instructions (Signed)
Please let us know when you've had your MRI related to your knee so we can let Dr Caryl Comes know.  Medication Instructions:  Your physician has recommended you make the following change in your medication:  1- TAKE Torsemide 40 mg by mouth every day for 3 days, THEN take 40 mg by mouth every other day.  *If you need a refill on your cardiac medications before your next appointment, please call your pharmacy*  Lab Work: none If you have labs (blood work) drawn today and your tests are completely normal, you will receive your results only by: [name] MyChart Message (if you have MyChart) OR . A paper copy in the mail If you have any lab test that is abnormal or we need to change your treatment, we will call you to review the results.  Testing/Procedures: none  Follow-Up: At [facility], you and your health needs are our priority.  As part of our continuing mission to provide you with exceptional heart care, we have created designated Provider Care Teams.  These Care Teams include your primary Cardiologist (physician) and Advanced Practice Providers (APPs -  Physician Assistants and Nurse Practitioners) who all work together to provide you with the care you need, when you need it.  We recommend signing up for the patient portal called "MyChart".  Sign up information is provided on this After Visit Summary.  MyChart is used to connect with patients for Virtual Visits (Telemedicine).  Patients are able to view lab/test results, encounter notes, upcoming appointments, etc.  Non-urgent messages can be sent to your provider as well.   To learn more about what you can do with MyChart, go to [email or web address].    Your next appointment:   12 month(s)  The format for your next appointment:   In Person  Provider:   Virl Axe, MD

## 2020-04-15 ENCOUNTER — Telehealth: Payer: Self-pay

## 2020-04-15 NOTE — Telephone Encounter (Signed)
Able to return call to [facility] with [facility] MRI department for pacemakers. Judeen Hammans reports since Mrs. Formanek has not been seen nor has she had a previous MRI at [facility] with her pacemaker, she will need a visit with the [facility] EP team prior to MRI being schedule and first avaliable would be May of this year, then the MRI can be schedule. Sherry sent a secure message to the EP team at [facility] to see if they are willing to schedule her for a consult and Judeen Hammans will reach out to Mrs. Digeronimo with scheduling an appt. Will route message to Cassell Smiles, [place] ordering provider with Ortho at [facility].

## 2020-04-15 NOTE — Telephone Encounter (Signed)
Sherry returning call. Please call [phone] opt [phone]  The order was received, no hx with MRI with device. Patient will need to be cleared by their team prior. First opening for MRI is in May

## 2020-04-15 NOTE — Telephone Encounter (Signed)
Faxed [facility] MRI radiology order from [facility], provider Cassell Smiles, [place] for [name] to have a MRI of her right knee. Per Dr. Caryl Comes progress notes on 04/05/2020 "She needs an MRI for her knee.  Will reach out to [facility] on her behalf to see if we can get it done there as she has a hybrid system with Medtronic leads and a Biotronik generator" Spoke with MRI tech at [facility], she will review order and pt's insurance and call back with updated details. Will update [facility], Dr. Lequita Halt, and his nurse Nira Conn once schedule.

## 2020-05-05 ENCOUNTER — Other Ambulatory Visit: Payer: Self-pay

## 2020-05-05 ENCOUNTER — Telehealth: Payer: Self-pay | Admitting: *Deleted

## 2020-05-05 ENCOUNTER — Encounter
Admission: RE | Admit: 2020-05-05 | Discharge: 2020-05-05 | Disposition: A | Discharge status: Home / Self Care | Payer: Medicare Other | Source: Ambulatory Visit | Attending: Orthopedic Surgery | Admitting: Orthopedic Surgery

## 2020-05-05 HISTORY — DX: Presence of cardiac pacemaker: Z95.0

## 2020-05-05 HISTORY — DX: Anemia, unspecified: D64.9

## 2020-05-05 HISTORY — DX: Chronic kidney disease, unspecified: N18.9

## 2020-05-05 HISTORY — DX: Syncope and collapse: R55

## 2020-05-05 HISTORY — DX: Disorders of diaphragm: J98.6

## 2020-05-05 NOTE — Telephone Encounter (Signed)
Request for pre-operative cardiac clearance Received: Today Karen Kitchens, NP  [facility] Request for pre-operative cardiac clearance:    1. What type of surgery is being performed?  KNEE ARTHROSCOPY   2. When is this surgery scheduled?  05/09/2020    3. Are there any medications that need to be held prior to surgery?  ASA   4. Practice name and name of physician performing surgery?  Performing surgeon: Dr. Skip Estimable, MD  Requesting clearance: Honor Loh, FNP-C     5. Anesthesia type (none, local, MAC, general)? General   6. What is the office phone and fax number?   Phone: [phone]  Fax: [phone]   ATTENTION: Unable to create telephone message as per your standard workflow. Directed by [facility] providers to send requests for cardiac clearance to this pool for appropriate distribution to provider covering pre-operative clearances.   Honor Loh, MSN, APRN, FNP-C, CEN  [facility]  Peri-operative Services Nurse Practitioner  Phone: [phone]  05/05/20 4:52 PM

## 2020-05-05 NOTE — H&P (Signed)
ORTHOPAEDIC HISTORY & PHYSICAL [name], Florinda Marker., MD - 04/28/2020 11:00 AM EDT Formatting of this note is different from the original. Images from the original note were not included. Chief Complaint: Chief Complaint  Patient presents with  . Right Knee - Pain   Reason for Visit: The patient is a 75 y.o. female who presents today for reevaluation of her right knee. She reportedthe onset of right knee pain in March 2021 following a twisting injury to the knee. She denied any right knee symptoms prior to that time. SHe has been treated with intraarticular corticosteroid injections and viscosupplementation without any significant improvement of her pain. She localizes most of the pain along the medial aspect of the knee. She reports some swelling, no locking, and some giving way of the knee. The pain is aggravated by going up and down stairs, lateral movements, pivoting and rising after sitting. She has a pacemaker that has created an issue with arranging MRI imaging.  Medications: Current Outpatient Medications  Medication Sig Dispense Refill  . alirocumab (PRALUENT PEN) 150 mg/mL PnIj Inject 1 pen subcutaneously every 14 (fourteen) days  . alirocumab 150 mg/mL PnIj Inject 150 mg subcutaneously every 14 (fourteen) days 08/01/19 last dose Saturdays  . aspirin 81 MG EC tablet Take 1 tablet (81 mg total) by mouth nightly 0  . cholecalciferol (VITAMIN D3) 2,000 unit tablet Take 2,000 Units by mouth once daily  . esomeprazole (NEXIUM) 40 MG DR capsule Take 1 capsule (40 mg total) by mouth 2 (two) times daily 180 capsule 3  . flash glucose sensor (FREESTYLE LIBRE 14 DAY SENSOR) kit Use 1 kit every 14 (fourteen) days 2 each 4  . gabapentin (NEURONTIN) 300 MG capsule Take 1 capsule (300 mg total) by mouth 3 (three) times daily 270 capsule 3  . insulin ASPART (NOVOLOG FLEXPEN) pen injector (concentration 100 units/mL) Up to 15 units three times per day based on food intake and blood glucose 45 mL  4  . LANTUS SOLOSTAR U-100 INSULIN pen injector (concentration 100 units/mL) Inject 50 Units subcutaneously nightly 45 mL 4  . levothyroxine (SYNTHROID) 75 MCG tablet Take 1 tablet (75 mcg total) by mouth once daily 90 tablet 3  . linaGLIPtin (TRADJENTA) 5 mg tablet Take 1 tablet (5 mg total) by mouth once daily 90 tablet 3  . methocarbamoL (ROBAXIN) 500 MG tablet Take by mouth  . nitroGLYcerin (NITROSTAT) 0.4 MG SL tablet Place 0.4 mg under the tongue every 5 (five) minutes as needed for Chest pain. May take up to 3 doses.  [name] ondansetron (ZOFRAN-ODT) 4 MG disintegrating tablet Take 1 tablet (4 mg total) by mouth every 8 (eight) hours as needed for Nausea 30 tablet 1  . pen needle, diabetic (BD NANO 2ND GEN PEN NEEDLE) 32 gauge x 5/32" Ndle Use 1 each 4 (four) times daily 400 each 4  . potassium chloride (KLOR-CON) 20 mEq packet Take 1-2 tablets by mouth once daily Depending on amount of torsemide taken daily  . ranolazine (RANEXA) 500 MG ER tablet Take 500 mg by mouth 2 (two) times daily  . rOPINIRole (REQUIP) 5 MG tablet Take 1 tablet (5 mg total) by mouth nightly 90 tablet 2  . temazepam (RESTORIL) 30 mg capsule TAKE 1 CAPSULE NIGHTLY 90 capsule 1  . TORsemide (DEMADEX) 20 MG tablet Take 20 mg by mouth once daily  . traMADoL (ULTRAM) 50 mg tablet Take 1 tablet (50 mg total) by mouth 3 (three) times daily 270 tablet 1   No  current facility-administered medications for this visit.   Allergies: Allergies  Allergen Reactions  . Metoclopramide Diarrhea  . Zetia [Ezetimibe] Diarrhea and Nausea  . Cefuroxime Axetil Diarrhea  . Codeine Rash  . Metrogel [Metronidazole-Skin Cleanser] Rash  . Penicillins Rash  Has patient had a PCN reaction causing immediate rash, facial/tongue/throat swelling, SOB or lightheadedness with hypotension: NO Has patient had a PCN reaction causing severe rash involving mucus membranes or skin necrosis: NO Has patient had a PCN retioion that required hospitalization  NO Has patient had a PCN reaction occurring within the last 10 years: NO If all of the above answers are "NO", then may proceed with Cephalosporin use.  [name] Propoxyphene Other (See Comments)  Unsure of reaction type  . Statins-Hmg-Coa Reductase Inhibitors Muscle Pain  Leg pain  . Victoza [Liraglutide] Nausea   Past Medical History: Past Medical History:  Diagnosis Date  . Adult idiopathic generalized osteoporosis 10/08/2016  . Allergic rhinitis due to allergen [employer]  . Allergic state ongoing  . Anesthesia complication  aspration pneumonia -- ( removal of hardware)  . Anesthesia complication  nerve block with r-RCR "went to deep " diapha. higher on right lung.  . Aspiration pneumonia (CMS-HCC)  . Broken wrist  left  . Bronchitis, chronic (CMS-HCC) 2018  . Cataract cortical, senile 2016  . CHF (congestive heart failure) (CMS-HCC) 7-28  diastolic CHF  . CKD (chronic kidney disease) stage 3, GFR 30-59 ml/min (CMS-HCC) 04/15/2017  . Coronary disease  50% LESIONS X3, RCA/LAD  . Dermatitis 2013  pustular roscea  . Diabetes mellitus type 2, uncomplicated (CMS-HCC)  . Family history of breast cancer  and CAD  . Gastric polyps 12/04  . GERD (gastroesophageal reflux disease)  WITH ESOPHAGEAL RING  . Hyperlipidemia 09/08/2013  . Hypertension 10+year  . Obesity 2002  . Osteoarthritis  cervical spine disease, lumbar spine, trigger nodules  . Pacemaker 03/04/2019  per [facility] records  . Right heart failure (CMS-HCC) 2018  . RLS (restless legs syndrome)  . Sinusitis, unspecified 2000  . Sleep apnea  ON CPAP  . Surgical menopause  . Thyroid disease About 5-7 years   Past Surgical History: Past Surgical History:  Procedure Laterality Date  . CARDIAC CATHETERIZATION 6-7 years ago  . CATARACT EXTRACTION 09-2014  . CHOLECYSTECTOMY  . COLONOSCOPY 11/25/2018  Tubular adenoma of the colon/Hyperplastic colon polyp CBF 11/2023  . DEBRIDEMENT BACK N/A 09/14/2019  Procedure:  DEBRIDEMENT, BACK; MUSCLE AND/OR FASCIA (INCLUDES EPIDERMIS, DERMIS, AND SUBCUTANEOUS TISSUE, IF PERFORMED); FIRST 20 SQ CM OR LESS; Surgeon: Ned Clines, MD; Location: [place] [facility]; Service: Plastic Surgery; Laterality: N/A;  . DILATION AND CURETTAGE, DIAGNOSTIC / THERAPEUTIC  . EGD 11/01/2014  Benign esophageal stricture/hiatus hernia/Negative biopsy/No Repeat/PYO  . EGD 11/25/2018  Esophageal Stenosis.Dilated No repeat per [facility]  . ENDOSCOPIC CARPAL TUNNEL RELEASE Bilateral  . FLAP MYOCUTANEOUS/FASCIOCUTANEOUS BACK Bilateral 08/24/2019  Procedure: MUSCLE, MYOCUTANEOUS, OR FASCIOCUTANEOUS FLAP; BACK; Surgeon: Ned Clines, MD; Location: [place]; Service: Plastic Surgery; Laterality: Bilateral;  . FRACTURE SURGERY 7-18  . HYSTERECTOMY A about age 23  . INCISION & DRAINAGE ABSCESS BACK 02/2016  . INCISION & DRAINAGE POSTERIOR SPINE SACRAL/LUMBOSACRAL N/A 08/24/2019  Procedure: INCISION AND DRAINAGE, OPEN, OF DEEP ABSCESS (SUBFASCIAL),LUMBAR AREA,L2-L4 POSTERIOR SPINE WITH REMOVAL OF INSTRUMENTATION AND PLACEMENT OF NEW L2-L4 INSTRUMENTATION; Surgeon: Ricard Dillon, MD; Location: [facility]; Service: Neurosurgery; Laterality: N/A;  . INSERTION INTERBODY BIOMECHANICAL DEVICE W/ INSTRUMENTATION N/A 08/10/2019  Procedure: INSERTION INTERBODY BIOMECHANICAL DEVICE WITH INTEGRAL ANTERIOR INSTRUMENTATION, TO  INTERVERTEBRAL DISC SPACE IN CONJUNCTION INTERBODY ARTHRODESIS, EACH INTERSPACE; Surgeon: Ricard Dillon, MD; Location: [facility]; Service: Neurosurgery; Laterality: N/A;  . INSTRUMENTATION NON-SEGMENTAL POSTERIOR SPINE N/A 08/10/2019  Procedure: INSTRUMENTATION POSTERIOR SPINE 1/2 VERTEBRAL SEGMENTS W/O FIXATION ADDITIONAL FIFTH; Surgeon: Ricard Dillon, MD; Location: [facility]; Service: Neurosurgery; Laterality: N/A;  . INTRAOPERATIVE FLUOROSCOPY N/A 08/10/2019  Procedure: FLUOROSCOPY; Surgeon: Ricard Dillon, MD; Location: [place]; Service:  Neurosurgery; Laterality: N/A;  . LAMINECTOMY LUMBAR SPINE 11/14/2015  mulit level disc fusion--- L3-S1  . OBLIQUE LATERAL INTERBODY FUSION LUMBAR N/A 08/10/2019  Procedure: ARTHRODESIS, ANTERIOR INTERBODY TECHNIQUE, INCLUDING MINIMAL DISCECTOMY TO PREPARE INTERSPACE (OTHER THAN FOR DECOMPRESSION); LUMBAR; Surgeon: Ricard Dillon, MD; Location: [place]; Service: Neurosurgery; Laterality: N/A;  . ORIF RADIAL HEAD / NECK FRACTURE Left 08/2016  . other 12/02/2015  e.coli infection in spine  . OTHER SURGERY  Pacemaker implant March 04 2019 Dr. Virl Axe  . OTHER SURGERY  Back surgery fusion L2-L3  . PERCUTANEOUS SPINAL FUSION N/A 08/10/2019  Procedure: L2-3 XLIF with Posterior Fusion at L2-3; Surgeon: Ricard Dillon, MD; Location: [place]; Service: Neurosurgery; Laterality: N/A;  . picc insertion 12/06/2015  . REPAIR ROTATOR CUFF TEAR ACUTE OPEN Right  . Right Trigger Thumb Release  . TAH/BSO  . TONSILLECTOMY  . Trigger thumb release Left 09/21/2015  Dr.Menz  . WISDOM TEETH EXTRACTION   Social History: Social History   Socioeconomic History  . Marital status: Married  Spouse name: Sonia Side  . Number of children: 2  . Years of education: 14+  . Highest education level: Not on file  Occupational History  . Occupation: Retired -Surveyor, mining  Tobacco Use  . Smoking status: Never Smoker  . Smokeless tobacco: Never Used  Vaping Use  . Vaping Use: Never used  Substance and Sexual Activity  . Alcohol use: Never  Alcohol/week: 0.0 standard drinks  . Drug use: Never  . Sexual activity: Not Currently  Partners: Male  Birth control/protection: Surgical  Other Topics Concern  . Not on file  Social History Narrative  . Not on file   Social Determinants of Health   Financial Resource Strain: Not on file  Food Insecurity: Not on file  Transportation Needs: Not on file  Physical Activity: Not on file  Stress: Not on file  Social Connections: Not  on file  Housing Stability: Not on file   Family History: Family History  Problem Relation Age of Onset  . Coronary Artery Disease (Blocked arteries around heart) Mother  . Myocardial Infarction (Heart attack) Mother  . Breast cancer Mother  deceased 04/30/07  . Cancer Mother  breast cancer  . Rheum arthritis Mother  . Coronary Artery Disease (Blocked arteries around heart) Father  . Prostate cancer Father  . Myocardial Infarction (Heart attack) Father  . Cancer Father  prostate cancer  . Coronary Artery Disease (Blocked arteries around heart) Brother  . Myocardial Infarction (Heart attack) Brother  . Myocardial Infarction (Heart attack) Sister  multiple  . Other Sister  low platelets  . Kidney cancer Sister  . Heart disease Sister  bypass surgery, stent placements,  . High blood pressure (Hypertension) Sister  . Heart disease Maternal Grandmother  . Alzheimer's disease Maternal Grandmother  deceased  . Colon cancer Maternal Grandmother  . No Known Problems Maternal Grandfather  . Stroke Paternal Grandmother  . Stroke Paternal Grandfather  . Alcohol abuse Paternal Uncle  . Anxiety Daughter  Adoption  . Asthma Daughter  Adoption  . Bipolar disorder Daughter  Adoption  . Coronary Artery Disease (Blocked arteries around heart) Sister  Bypass, stents, valve replacement  . Hyperlipidemia (Elevated cholesterol) Sister  . High blood pressure (Hypertension) Sister  . Skin cancer Sister  Myeloma  . Thyroid disease Sister  . Cancer Sister  kidney cancer  . Diabetes type II Sister  . Coronary Artery Disease (Blocked arteries around heart) Brother  Multible stents I  . Coronary Artery Disease (Blocked arteries around heart) Brother  Quadruple bypass  . Obesity Maternal Aunt  deceased  . Thyroid disease Sister   Review of Systems: A comprehensive 14 point ROS was performed, reviewed, and the pertinent orthopaedic findings are documented in the HPI.  Exam BP (!) 160/80  (BP Location: Left upper arm, Patient Position: Sitting, BP Cuff Size: Adult)  Temp 36.2 C (97.2 F)  Ht 157.5 cm ('5\' 2"' )  Wt 93.4 kg (206 lb)  BMI 37.68 kg/m   General:  Well-developed, well-nourished female seen in no acute distress.  Even heel to toe gait.  No varus or valgus thrust to the right knee.  HEENT:  Atraumatic, normocephalic. Pupils are equal and reactive to light. Extraocular motion is intact. Sclera are clear. Oropharynx is clear with moist mucosa.  Lungs:  Clear to auscultation bilaterally.  Cardiovascular: Regular rate and rhythm. Normal S1, S2. No murmur . No appreciable gallops or rubs. Peripheral pulses are palpable. No lower extremity edema. Homan`s test is negative.   Extremities: Good strength, stability, and range of motion of the upper extremities. Good range of motion of the hips and ankles.  Right Knee:  Soft tissue swelling: minimal Effusion: none Erythema: none Crepitance: minimal Tenderness: medial Alignment: normal Mediolateral laxity: stable Anterior drawer test:negative Lachman`s test: negative McMurray`s test: positive Atrophy: No significant atrophy.  Quadriceps tone was fair to good. Range of Motion: greater than 120 degrees; pain is elicited with extremes of extension  Neurologic:  Awake, alert, and oriented.  Sensory function is intact to pinprick and light touch.  Motor strength is judged to be 5/5.  Motor coordination is within normal limits.  No apparent clonus. No tremor.   Radiographs: I reviewed the right knee radiographs that were performed at [facility] on 03/17/2020. There is mild narrowing of the medial cartilage space. No significant osteophyte formation or subchondral sclerosis is appreciated. No evidence of fracture or dislocation.   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 Although the  patient has radiographs demonstrating mild degenerative change, her symptoms are mor suggestive of meniscal pathology. The patient was given informational material on knee arthroscopy. Conservative treatment options were reviewed with the patient. We discussed the risks and benefits of surgical intervention. The usual perioperative course was also discussed in detail. Arthroscopy is an appropriate treatment for the meniscal pathology, but would have limited or no effect on degenerative changes of the articular cartilage. The patient expressed understanding of the risks and benefits of surgical intervention and would like to proceed with plans for right knee arthroscopy.  MEDICAL CLEARANCE: Per anesthesiology. ACTIVITIES:  Avoid pivoting, squatting, or twisting. WORK STATUS: Not applicable. THERAPY: Quadriceps strengthening exercises. MEDICATIONS: Requested Prescriptions   No prescriptions requested or ordered in this encounter   FOLLOW-UP: Return for preop History & Physical pending surgery date.   Donja Tipping P. Holley Bouche., M.D.  This note was generated in part with voice recognition software and I apologize for any typographical errors that  were not detected and corrected.   Electronically signed by Lamar Benes., MD at 05/01/2020 11:10 AM EDT

## 2020-05-05 NOTE — Patient Instructions (Addendum)
Your procedure is scheduled on: 05-09-20 MONDAY Report to the Registration Desk on the 1st floor of the [facility]-Then proceed to the 2nd floor in the [facility] To find out your arrival time, please call [phone] between 1PM - 3PM on:05-06-20 FRIDAY  REMEMBER: Instructions that are not followed completely may result in serious medical risk, up to and including death; or upon the discretion of your surgeon and anesthesiologist your surgery may need to be rescheduled.  Do not eat food after midnight the night before surgery.  No gum chewing, lozengers or hard candies.  You may however, drink WATER up to 2 hours before you are scheduled to arrive for your surgery. Do not drink anything within 2 hours of your scheduled arrival time.  Type 1 and Type 2 diabetics should only drink water.  TAKE THESE MEDICATIONS THE MORNING OF SURGERY WITH A SIP OF WATER: -SYNTHROID (LEVOTHYROXINE) -ATENOLOL (TENORMIN) -GABAPENTIN (NEURONTIN) -NEXIUM (ESOMEPRAZOLE)-take one the night before and one on the morning of surgery - helps to prevent nausea after surgery.)  Take 1/2 of usual insulin dose the night before surgery and none on the morning of surgery-DO NOT TAKE ANY INSULIN THE DAY OF SURGERY  Follow recommendations from Cardiologist, Pulmonologist or PCP regarding stopping Aspirin, Coumadin, Plavix, Eliquis, Pradaxa, or Pletal-CALL DR HOOTEN'S OFFICE ABOUT WHEN TO STOP YOUR 81 MG ASPIRIN  One week prior to surgery: Stop Anti-inflammatories (NSAIDS) such as Advil, Aleve, Ibuprofen, Motrin, Naproxen, Naprosyn and Aspirin based products such as Excedrin, Goodys Powder, BC Powder-OK TO TAKE TYLENOL IF NEEDED  Stop ANY OVER THE COUNTER supplements until after surgery-YOU MAY CONTINUE YOUR VITAMIN D UP UNTIL THE DAY BEFORE YOUR SURGERY  No Alcohol for 24 hours before or after surgery.  No Smoking including e-cigarettes for 24 hours prior to surgery.  No chewable tobacco products for at least 6  hours prior to surgery.  No nicotine patches on the day of surgery.  Do not use any "recreational" drugs for at least a week prior to your surgery.  Please be advised that the combination of cocaine and anesthesia may have negative outcomes, up to and including death. If you test positive for cocaine, your surgery will be cancelled.  On the morning of surgery brush your teeth with toothpaste and water, you may rinse your mouth with mouthwash if you wish. Do not swallow any toothpaste or mouthwash.  Do not wear jewelry, make-up, hairpins, clips or nail polish.  Do not wear lotions, powders, or perfumes.   Do not shave body from the neck down 48 hours prior to surgery just in case you cut yourself which could leave a site for infection.  Also, freshly shaved skin may become irritated if using the CHG soap.  Contact lenses, hearing aids and dentures may not be worn into surgery.  Do not bring valuables to the hospital. [facility] is not responsible for any missing/lost belongings or valuables.   Use CHG Soap as directed on instruction sheet.  Bring your C-PAP to the hospital with you in case you may have to spend the night.   Notify your doctor if there is any change in your medical condition (cold, fever, infection).  Wear comfortable clothing (specific to your surgery type) to the hospital.  Plan for stool softeners for home use; pain medications have a tendency to cause constipation. You can also help prevent constipation by eating foods high in fiber such as fruits and vegetables and drinking plenty of fluids as your diet  allows.  After surgery, you can help prevent lung complications by doing breathing exercises.  Take deep breaths and cough every 1-2 hours. Your doctor may order a device called an Incentive Spirometer to help you take deep breaths. When coughing or sneezing, hold a pillow firmly against your incision with both hands. This is called "splinting." Doing this helps  protect your incision. It also decreases belly discomfort.  If you are being admitted to the hospital overnight, leave your suitcase in the car. After surgery it may be brought to your room.  If you are being discharged the day of surgery, you will not be allowed to drive home. You will need a responsible adult (18 years or older) to drive you home and stay with you that night.   If you are taking public transportation, you will need to have a responsible adult (18 years or older) with you. Please confirm with your physician that it is acceptable to use public transportation.   Please call the [facility] at [phone] if you have any questions about these instructions.  Surgery Visitation Policy:  Patients undergoing a surgery or procedure may have one family member or support person with them as long as that person is not COVID-19 positive or experiencing its symptoms.  That person may remain in the waiting area during the procedure.  Inpatient Visitation:    Visiting hours are 7 a.m. to 8 p.m. Inpatients will be allowed two visitors daily. The visitors may change each day during the patient's stay. No visitors under the age of 56. Any visitor under the age of 67 must be accompanied by an adult. The visitor must pass COVID-19 screenings, use hand sanitizer when entering and exiting the patient's room and wear a mask at all times, including in the patient's room. Patients must also wear a mask when staff or their visitor are in the room. Masking is required regardless of vaccination status.

## 2020-05-06 ENCOUNTER — Inpatient Hospital Stay: Admission: RE | Admit: 2020-05-06 | Payer: Medicare Other | Source: Ambulatory Visit

## 2020-05-06 ENCOUNTER — Encounter: Payer: Self-pay | Admitting: Orthopedic Surgery

## 2020-05-06 ENCOUNTER — Encounter
Admission: RE | Admit: 2020-05-06 | Discharge: 2020-05-06 | Disposition: A | Discharge status: Home / Self Care | Payer: Medicare Other | Source: Ambulatory Visit | Attending: Orthopedic Surgery | Admitting: Orthopedic Surgery

## 2020-05-06 ENCOUNTER — Encounter: Payer: Self-pay | Admitting: Internal Medicine

## 2020-05-06 DIAGNOSIS — Z20822 Contact with and (suspected) exposure to covid-19: Secondary | ICD-10-CM | POA: Diagnosis not present

## 2020-05-06 DIAGNOSIS — Z01812 Encounter for preprocedural laboratory examination: Secondary | ICD-10-CM | POA: Insufficient documentation

## 2020-05-06 LAB — CBC
HCT: 36 % (ref 36.0–46.0)
Hemoglobin: 11.2 g/dL — ABNORMAL LOW (ref 12.0–15.0)
MCH: 26.5 pg (ref 26.0–34.0)
MCHC: 31.1 g/dL (ref 30.0–36.0)
MCV: 85.3 fL (ref 80.0–100.0)
Platelets: 244 10*3/uL (ref 150–400)
RBC: 4.22 MIL/uL (ref 3.87–5.11)
RDW: 15.1 % (ref 11.5–15.5)
WBC: 5.7 10*3/uL (ref 4.0–10.5)
nRBC: 0 % (ref 0.0–0.2)

## 2020-05-06 NOTE — Progress Notes (Signed)
Perioperative Services  Pre-Admission/Anesthesia Testing Clinical Review  Date: 05/06/20  Patient Demographics:  Name: [name] DOB:   1945-07-18 MRN:   [id number]  Planned Surgical Procedure(s):    Case: [id number] Date/Time: 05/09/20 1114   Procedure: ARTHROSCOPY KNEE (Right Knee)   Anesthesia type: Choice   Pre-op diagnosis: INTERNAL DERANGEMENT OF RIGHT KNEE.   Location: [facility] OR ROOM 01 / [place] ORS FOR ANESTHESIA GROUP   Surgeons: Dereck Leep, MD    NOTE: Available PAT nursing documentation and vital signs have been reviewed. Clinical nursing staff has updated patient's PMH/PSHx, current medication list, and drug allergies/intolerances to ensure comprehensive history available to assist in medical decision making as it pertains to the aforementioned surgical procedure and anticipated anesthetic course.   Clinical Discussion:  [name] is a 75 y.o. female who is submitted for pre-surgical anesthesia review and clearance prior to her undergoing the above procedure. Patient has never been a smoker. Pertinent PMH includes: CAD, NSVT (has PPM), CHF (G2DD), aortic atherosclerosis, HTN, HLD, hypothyroidism, elevated right hemidiaphragm, OSAH (requires nocturnal PAP therapy), GERD (on daily PPI), OA, anxiety, depression, insomnia.  Patient is followed by cardiology Ana Situ, MD). She was last seen in the cardiology clinic on 03/14/2020; notes reviewed.  At the time of her clinic visit, patient doing well overall from a cardiovascular perspective.  Patient denied any chest pain, significant shortness of breath, PND, orthopnea, significant peripheral edema, vertiginous symptoms, palpitations, or presyncope/syncope.  Patient with a significant cardiac history.  TTE performed on 07/17/2017 revealed normal left ventricular systolic function (LVEF 93-57%) with G2DD. Last diagnostic cardiac catheterization performed on 10/01/2017 revealed moderate to severe single-vessel CAD with 50% stenosis  of the proximal/mid LAD, in addition to an 80% stenosis of the OM1.  Decision was made to pursue treatment with medical therapy as PCI would be challenging given the small size of the LAD and diagonal branches.  Patient underwent loop recorder insertion for recurrent syncope in 10/2017.  She was subsequently seen in follow-up in 02/2019 for interval syncopal events and found to be profoundly bradycardic with episodes of asystole lasting as long as 13 seconds.  Patient underwent PPM placement in 02/2019 with Dr. Caryl Castaneda (see full interpretation of cardiovascular testing and intervention below).  Patient was last seen in electrophysiology clinic on 04/05/2020.  Patient on GDMT for her HTN and HLD diagnoses.  Blood pressure well controlled at 130/60 on currently prescribed diuretic monotherapy.  Patient is on a PCSK9 for her HLD. T2DM well controlled on currently prescribed regimen; Hgb A1c 6.6% when last checked on 02/18/2020. Functional capacity, as defined by DASI, is documented as being >/= 4 METS.  No changes were made to patient's medication regimen.  Patient to follow-up with outpatient cardiology at defined intervals for ongoing care management.  Patient is scheduled to undergo an elective orthopedic procedure on 05/09/2020 with Dr. Skip Castaneda.  Given patient's past medical history significant for cardiovascular disease and intervention, presurgical cardiac clearance was sought by PAT team.  Per cardiology, "based on patient's past medical history and time since his last clinic visit, patient would be at an overall ACCEPTABLE risk for the planned procedure without further cardiovascular testing or intervention at this time".  Patient is on daily antiplatelet therapy.  Cardiology notes that ideally patient should continue her daily low-dose ASA throughout the perioperative period, however if she absolutely needs to hold this medication, patient has been cleared to come off of her aspirin prior to surgery and  restart as  soon as postoperative bleeding risk felt to be minimized by attending surgeon.  Patient to contact Dr. Clydell Hakim office regarding her ASA therapy.  Patient reports previous perioperative complications with anesthesia in the past.  Patient advising that during postoperative period in 2018 (wrist surgery) that she aspirated when LMA was being removed.  Patient states, "I only want to be fully intubated for surgery"; ETT only rather than LMA. In review of the available records, it is noted that patient underwent a general anesthetic course at [facility] (ASA III) in 09/2019 without documented complications.   Vitals with BMI 04/05/2020 03/14/2020 09/11/2019  Height 5\' 2"  5\' 2"  5\' 2"   Weight 206 lbs 203 lbs 213 lbs  BMI 37.67 90.24 09.73  Systolic 532 992 426  Diastolic 61 60 58  Pulse 91 81 91    Providers/Specialists:   NOTE: Primary physician provider listed below. Patient may have been seen by APP or partner within same practice.   PROVIDER ROLE / SPECIALTY LAST OV  Hooten, Ana Record, MD Orthopedics (Surgeon)  04/28/2020  Ana Aus, MD Primary Care Provider  03/10/2020  Ana Rogue, MD Cardiology  03/14/2020   Allergies:  Ezetimibe, Metoclopramide, Propoxyphene, Statins, Tramadol, Ceftin [cefuroxime axetil], Codeine, Penicillins, and Vicodin [hydrocodone-acetaminophen]  Current Home Medications:   No current facility-administered medications for this encounter.   . Acetaminophen (TYLENOL ARTHRITIS PAIN PO)  . aspirin 81 MG tablet  . atenolol (TENORMIN) 25 MG tablet  . Azelaic Acid (FINACEA) 15 % FOAM  . azelastine (ASTELIN) 0.1 % nasal spray  . Cholecalciferol (VITAMIN D) 2000 units tablet  . esomeprazole (NEXIUM) 40 MG capsule  . gabapentin (NEURONTIN) 300 MG capsule  . insulin glargine (LANTUS) 100 UNIT/ML injection  . levothyroxine (SYNTHROID, LEVOTHROID) 75 MCG tablet  . linagliptin (TRADJENTA) 5 MG TABS tablet  . methocarbamol (ROBAXIN) 500 MG tablet  .  nitroGLYCERIN (NITROSTAT) 0.4 MG SL tablet  . ondansetron (ZOFRAN) 4 MG tablet  . potassium chloride SA (KLOR-CON) 20 MEQ tablet  . PRALUENT 150 MG/ML SOAJ  . ropinirole (REQUIP) 5 MG tablet  . temazepam (RESTORIL) 30 MG capsule  . torsemide (DEMADEX) 20 MG tablet  . traMADol (ULTRAM) 50 MG tablet  . trolamine salicylate (ASPERCREME) 10 % cream  . albuterol (PROVENTIL HFA;VENTOLIN HFA) 108 (90 Base) MCG/ACT inhaler  . Insulin Aspart (NOVOLOG FLEXPEN [place])  . ipratropium-albuterol (DUONEB) 0.5-2.5 (3) MG/3ML SOLN   . dexamethasone (DECADRON) injection 10 mg   History:   Past Medical History:  Diagnosis Date  . Acquired elevated diaphragm    HIGHER ON RIGHT SIDE  . Anemia   . Anxiety   . Aortic atherosclerosis ([place])   . Arthritis   . CHF (congestive heart failure) ([place])   . Chronic back pain    stenosis.degenerative disc,some scoliosis  . Chronic kidney disease    stage 3  . Complication of anesthesia 2018   aspirated during wrist surgery during LMA removal  . Constipation    takes Stool Softener daily  . Coronary artery disease   . Depression    takes Cymbalta daily  . Diabetes mellitus    Type 2 diabetic. Average fasting blood sugar runs high 170-200  . Diastolic CHF ([place]) 83/41/9622   Per patient, diagnosed in 2018  . Diverticulosis   . Dysplastic nevus 09/12/2017   L sup buttock - mild   . Dysplastic nevus 09/18/2018   R sup med buttocks - mild   . E coli infection   . GERD (gastroesophageal  reflux disease)    takes Nexium daily  . Grade II diastolic dysfunction   . Headache   . Hemorrhoids   . History of colon polyps    benign  . History of gout    doesn't take any meds  . History of hiatal hernia    small  . History of vertigo    doesn't take any meds  . Hyperlipidemia    takes Praluent daily  . Hypertension    currently BP medications are on hold   . Hypothyroidism    takes Synthroid daily  . Insomnia    takes Restoril nightly  . Muscle spasm     takes Robaxin as needed  . NSVT (nonsustained ventricular tachycardia) ([place])   . OSA on CPAP   . Peripheral vascular disease ([place])    AAA as stated per pt / was just discovered and pt states has not been referred to vascular MD   . Presence of permanent cardiac pacemaker   . Restless leg    takes Requip daily  . Rosacea   . Syncope   . Varicose veins    Past Surgical History:  Procedure Laterality Date  . ABDOMINAL HYSTERECTOMY     with BSo  . CARDIAC CATHETERIZATION  2013   Normal  . CARPAL TUNNEL RELEASE Bilateral   . CATARACT EXTRACTION, BILATERAL    . CHOLECYSTECTOMY    . COLONOSCOPY    . COLONOSCOPY WITH PROPOFOL N/A 11/25/2018   Procedure: COLONOSCOPY WITH PROPOFOL;  Surgeon: Toledo, Benay Pike, MD;  Location: [facility] ENDOSCOPY;  Service: Gastroenterology;  Laterality: N/A;  . DIAGNOSTIC LAPAROSCOPY     multiple times  . DILATION AND CURETTAGE OF UTERUS    . ESOPHAGOGASTRODUODENOSCOPY (EGD) WITH PROPOFOL N/A 11/01/2014   Procedure: ESOPHAGOGASTRODUODENOSCOPY (EGD) WITH PROPOFOL;  Surgeon: Hulen Luster, MD;  Location: [facility] ENDOSCOPY;  Service: Gastroenterology;  Laterality: N/A;  . ESOPHAGOGASTRODUODENOSCOPY (EGD) WITH PROPOFOL N/A 11/25/2018   Procedure: ESOPHAGOGASTRODUODENOSCOPY (EGD) WITH PROPOFOL;  Surgeon: Toledo, Benay Pike, MD;  Location: [facility] ENDOSCOPY;  Service: Gastroenterology;  Laterality: N/A;  . HARDWARE REMOVAL Left 10/11/2016   Procedure: HARDWARE REMOVAL-LEFT RADIUS;  Surgeon: Lovell Sheehan, MD;  Location: [facility] ORS;  Service: Orthopedics;  Laterality: Left;  Left Radius Wrist   . IMPLANTABLE CONTACT LENS IMPLANTATION     bilateral  . LAMINECTOMY  11/13/2015  . LEFT HEART CATH AND CORONARY ANGIOGRAPHY Left 10/01/2017   Procedure: LEFT HEART CATH AND CORONARY ANGIOGRAPHY;  Surgeon: Nelva Bush, MD;  Location: [place] CV LAB;  Service: Cardiovascular;  Laterality: Left;  . LOOP RECORDER INSERTION N/A 10/17/2017   Procedure: LOOP RECORDER INSERTION;  Surgeon:  Deboraha Sprang, MD;  Location: [place] CV LAB;  Service: Cardiovascular;  Laterality: N/A;  . LOOP RECORDER REMOVAL N/A 03/04/2019   Procedure: LOOP RECORDER REMOVAL;  Surgeon: Deboraha Sprang, MD;  Location: [place] CV LAB;  Service: Cardiovascular;  Laterality: N/A;  . LUMBAR FUSION  11/2015  . LUMBAR WOUND DEBRIDEMENT N/A 12/02/2015   Procedure: WOUND Exploration;  Surgeon: Consuella Lose, MD;  Location: [place];  Service: Neurosurgery;  Laterality: N/A;  . OPEN REDUCTION INTERNAL FIXATION (ORIF) DISTAL RADIAL FRACTURE Left 08/29/2016   Procedure: OPEN REDUCTION INTERNAL FIXATION (ORIF) DISTAL RADIAL FRACTURE;  Surgeon: Lovell Sheehan, MD;  Location: [facility] ORS;  Service: Orthopedics;  Laterality: Left;  . PACEMAKER IMPLANT N/A 03/04/2019   Procedure: PACEMAKER IMPLANT;  Surgeon: Deboraha Sprang, MD;  Location: [place] CV LAB;  Service:  Cardiovascular;  Laterality: N/A;  . PICC LINE PLACE PERIPHERAL ([place] HX)     right upper arm   . ROTATOR CUFF REPAIR Right   . SAVORY DILATION N/A 11/01/2014   Procedure: SAVORY DILATION;  Surgeon: Hulen Luster, MD;  Location: [facility] ENDOSCOPY;  Service: Gastroenterology;  Laterality: N/A;  . TONSILLECTOMY    . TRIGGER FINGER RELEASE Bilateral    Family History  Problem Relation Age of Onset  . Heart disease Mother   . Breast cancer Mother 53  . Heart disease Father   . Heart attack Sister   . Heart disease Sister   . Heart disease Brother    Social History   Tobacco Use  . Smoking status: Never Smoker  . Smokeless tobacco: Never Used  Vaping Use  . Vaping Use: Never used  Substance Use Topics  . Alcohol use: No  . Drug use: No    Pertinent Clinical Results:  LABS: Labs reviewed: Acceptable for surgery.  Hospital Outpatient Visit on 05/06/2020  Component Date Value Ref Range Status  . WBC 05/06/2020 5.7  4.0 - 10.5 K/uL Final  . RBC 05/06/2020 4.22  3.87 - 5.11 MIL/uL Final  . Hemoglobin 05/06/2020 11.2* 12.0 - 15.0 g/dL Final   . HCT 05/06/2020 36.0  36.0 - 46.0 % Final  . MCV 05/06/2020 85.3  80.0 - 100.0 fL Final  . MCH 05/06/2020 26.5  26.0 - 34.0 pg Final  . MCHC 05/06/2020 31.1  30.0 - 36.0 g/dL Final  . RDW 05/06/2020 15.1  11.5 - 15.5 % Final  . Platelets 05/06/2020 244  150 - 400 K/uL Final  . nRBC 05/06/2020 0.0  0.0 - 0.2 % Final   Performed at [facility], [address] [place], [place] [address]       ECG: Date: 03/14/2020 Time ECG obtained: 1031 AM Rate: 81 bpm Rhythm: normal sinus Axis (leads I and aVF): Normal Intervals: PR 152 ms. QRS 80 ms. QTc 432 ms. ST segment and T wave changes: No evidence of acute ST segment elevation or depression Comparison: Similar to previous tracing obtained on 09/11/2019   IMAGING / PROCEDURES: LEFT HEART CATHETERIZATION AND CORONARY ANGIOGRAPHY performed on 10/01/2017 1. Moderate to severe single-vessel CAD with 50% proximal/mid LAD disease as well as 80% ostial D1 stenosis. 2. Both LAD and diagonal branches are relatively small 3. Large dominant left circumflex and small nondominant RCA without significant coronary artery disease 4. Moderately elevated LVEDP consistent with diastolic heart failure 5. Favor continued medical therapy has PCI to the diagonal branch will be challenging given the small size of the ostial location  ECHOCARDIOGRAM performed on 07/17/2017 1. LVEF 60-65% 2. Left ventricular systolic function and cavity size normal 3. No regional wall motion abnormalities 4. Features are consistent with pseudonormal left ventricular filling pattern with concomitant abnormal relaxation and increased filling pressure (G2DD) 5. Left atrium normal in size 6. Right ventricular systolic function normal 7. Pulmonary artery systolic pressure within normal range 8. No evidence of valvular insufficiency 9. No evidence of valvular stenosis 10. No pericardial effusion  Impression and Plan:  LAVENE PENAGOS has been referred for  pre-anesthesia review and clearance prior to her undergoing the planned anesthetic and procedural courses. Available labs, pertinent testing, and imaging results were personally reviewed by me. This patient has been appropriately cleared by cardiology with an overall ACCEPTABLE risk of significant perioperative cardiovascular complications.  Perioperative prescription for cardiac device programming form completed by primary cardiologist and included on  patient's chart for review by surgical/anesthetic team.  Please see note in HPI regarding intubation concerns.   Based on clinical review performed today (05/06/20), barring any significant acute changes in the patient's overall condition, it is anticipated that she will be able to proceed with the planned surgical intervention. Any acute changes in clinical condition may necessitate her procedure being postponed and/or cancelled. Pre-surgical instructions were reviewed with the patient during her PAT appointment and questions were fielded by PAT clinical staff.  Honor Loh, MSN, APRN, FNP-C, CEN [facility]  Peri-operative Services Nurse Practitioner Phone: [phone] 05/06/20 2:48 PM  NOTE: This note has been prepared using Dragon dictation software. Despite my best ability to proofread, there is always the potential that unintentional transcriptional errors may still occur from this process.

## 2020-05-06 NOTE — Telephone Encounter (Signed)
   Primary Cardiologist: Ida Rogue, MD  Chart reviewed as part of pre-operative protocol coverage. Patient was contacted 05/06/2020 in reference to pre-operative risk assessment for pending surgery as outlined below.  [name] was last seen on 04/05/2020 by Dr. Caryl Comes.  Since that day, [name] has done well without chest pain or shortness of breath.  Although ideally she should continue on the aspirin through the surgery, however if absolutely needed, she can come off of aspirin prior to the surgery and restart as soon as possible afterward.  Therefore, based on ACC/AHA guidelines, the patient would be at acceptable risk for the planned procedure without further cardiovascular testing.   The patient was advised that if she develops new symptoms prior to surgery to contact our office to arrange for a follow-up visit, and she verbalized understanding.  I will route this recommendation to the requesting party via Epic fax function and remove from pre-op pool. Please call with questions.  [place], [place] 05/06/2020, 8:57 AM

## 2020-05-06 NOTE — Progress Notes (Signed)
[facility] DEVICE PROGRAMMING  Patient Information: Name:  MELIYAH SIMON  DOB:  03/01/1945  MRN:  [id number]    Planned Procedure: KNEE ARTHROSCOPY  Surgeon: Dr. Skip Estimable, MD  Date of Procedure: 05/09/2020  Cautery will be used.   Please send documentation back to:  [facility] [phone] # [phone])    Device Information:  Clinic EP Physician:  Virl Axe, MD   Device Type:  Pacemaker Manufacturer and Phone #:  [place]: [phone] Pacemaker Dependent?:  No. Date of Last Device Check:  03/02/2020 Normal Device Function?:  Yes.    Electrophysiologist's Recommendations:   Have magnet available.  Provide continuous ECG monitoring when magnet is used or reprogramming is to be performed.   Procedure should not interfere with device function.  No device programming or magnet placement needed.  Per Device Clinic Standing Orders, Simone Curia, RN  9:26 AM 05/06/2020

## 2020-05-07 LAB — SARS CORONAVIRUS 2 (TAT 6-24 HRS): SARS Coronavirus 2: NEGATIVE

## 2020-05-08 ENCOUNTER — Encounter: Payer: Self-pay | Admitting: Orthopedic Surgery

## 2020-05-08 DIAGNOSIS — K219 Gastro-esophageal reflux disease without esophagitis: Secondary | ICD-10-CM | POA: Insufficient documentation

## 2020-05-08 DIAGNOSIS — I2510 Atherosclerotic heart disease of native coronary artery without angina pectoris: Secondary | ICD-10-CM | POA: Insufficient documentation

## 2020-05-08 DIAGNOSIS — G2581 Restless legs syndrome: Secondary | ICD-10-CM | POA: Insufficient documentation

## 2020-05-09 ENCOUNTER — Other Ambulatory Visit: Payer: Self-pay

## 2020-05-09 ENCOUNTER — Encounter: Payer: Self-pay | Admitting: Orthopedic Surgery

## 2020-05-09 ENCOUNTER — Ambulatory Visit
Admission: RE | Admit: 2020-05-09 | Discharge: 2020-05-09 | Disposition: A | Discharge status: Home / Self Care | Payer: Medicare Other | Attending: Orthopedic Surgery | Admitting: Orthopedic Surgery

## 2020-05-09 ENCOUNTER — Ambulatory Visit: Payer: Medicare Other | Admitting: Urgent Care

## 2020-05-09 ENCOUNTER — Encounter
Admission: RE | Disposition: A | Discharge status: Home / Self Care | Payer: Self-pay | Source: Home / Self Care | Attending: Orthopedic Surgery

## 2020-05-09 DIAGNOSIS — Z885 Allergy status to narcotic agent status: Secondary | ICD-10-CM | POA: Insufficient documentation

## 2020-05-09 DIAGNOSIS — Z79890 Hormone replacement therapy (postmenopausal): Secondary | ICD-10-CM | POA: Diagnosis not present

## 2020-05-09 DIAGNOSIS — E1122 Type 2 diabetes mellitus with diabetic chronic kidney disease: Secondary | ICD-10-CM | POA: Diagnosis not present

## 2020-05-09 DIAGNOSIS — Z79899 Other long term (current) drug therapy: Secondary | ICD-10-CM | POA: Insufficient documentation

## 2020-05-09 DIAGNOSIS — Z881 Allergy status to other antibiotic agents status: Secondary | ICD-10-CM | POA: Insufficient documentation

## 2020-05-09 DIAGNOSIS — G4730 Sleep apnea, unspecified: Secondary | ICD-10-CM | POA: Insufficient documentation

## 2020-05-09 DIAGNOSIS — N183 Chronic kidney disease, stage 3 unspecified: Secondary | ICD-10-CM | POA: Insufficient documentation

## 2020-05-09 DIAGNOSIS — M94261 Chondromalacia, right knee: Secondary | ICD-10-CM | POA: Insufficient documentation

## 2020-05-09 DIAGNOSIS — M1711 Unilateral primary osteoarthritis, right knee: Secondary | ICD-10-CM | POA: Diagnosis not present

## 2020-05-09 DIAGNOSIS — Z950 Presence of cardiac pacemaker: Secondary | ICD-10-CM | POA: Diagnosis not present

## 2020-05-09 DIAGNOSIS — Z888 Allergy status to other drugs, medicaments and biological substances status: Secondary | ICD-10-CM | POA: Insufficient documentation

## 2020-05-09 DIAGNOSIS — Z7984 Long term (current) use of oral hypoglycemic drugs: Secondary | ICD-10-CM | POA: Diagnosis not present

## 2020-05-09 DIAGNOSIS — M2391 Unspecified internal derangement of right knee: Secondary | ICD-10-CM | POA: Diagnosis present

## 2020-05-09 DIAGNOSIS — I5082 Biventricular heart failure: Secondary | ICD-10-CM | POA: Insufficient documentation

## 2020-05-09 DIAGNOSIS — K219 Gastro-esophageal reflux disease without esophagitis: Secondary | ICD-10-CM | POA: Insufficient documentation

## 2020-05-09 DIAGNOSIS — I5032 Chronic diastolic (congestive) heart failure: Secondary | ICD-10-CM | POA: Diagnosis not present

## 2020-05-09 DIAGNOSIS — Z880 Allergy status to penicillin: Secondary | ICD-10-CM | POA: Insufficient documentation

## 2020-05-09 DIAGNOSIS — I2510 Atherosclerotic heart disease of native coronary artery without angina pectoris: Secondary | ICD-10-CM | POA: Insufficient documentation

## 2020-05-09 DIAGNOSIS — Z794 Long term (current) use of insulin: Secondary | ICD-10-CM | POA: Diagnosis not present

## 2020-05-09 DIAGNOSIS — M23221 Derangement of posterior horn of medial meniscus due to old tear or injury, right knee: Secondary | ICD-10-CM | POA: Insufficient documentation

## 2020-05-09 DIAGNOSIS — I130 Hypertensive heart and chronic kidney disease with heart failure and stage 1 through stage 4 chronic kidney disease, or unspecified chronic kidney disease: Secondary | ICD-10-CM | POA: Diagnosis not present

## 2020-05-09 DIAGNOSIS — Z9889 Other specified postprocedural states: Secondary | ICD-10-CM

## 2020-05-09 DIAGNOSIS — Z7982 Long term (current) use of aspirin: Secondary | ICD-10-CM | POA: Diagnosis not present

## 2020-05-09 DIAGNOSIS — E785 Hyperlipidemia, unspecified: Secondary | ICD-10-CM | POA: Diagnosis not present

## 2020-05-09 HISTORY — PX: KNEE ARTHROSCOPY: SHX127

## 2020-05-09 HISTORY — DX: Atherosclerosis of aorta: I70.0

## 2020-05-09 HISTORY — DX: Other ill-defined heart diseases: I51.89

## 2020-05-09 LAB — GLUCOSE, CAPILLARY
Glucose-Capillary: 121 mg/dL — ABNORMAL HIGH (ref 70–99)
Glucose-Capillary: 132 mg/dL — ABNORMAL HIGH (ref 70–99)

## 2020-05-09 SURGERY — ARTHROSCOPY, KNEE
Anesthesia: General | Site: Knee | Laterality: Right

## 2020-05-09 MED ORDER — MIDAZOLAM HCL 2 MG/2ML IJ SOLN
INTRAMUSCULAR | Status: DC | PRN
  Administered 2020-05-09: 2 mg via INTRAVENOUS

## 2020-05-09 MED ORDER — FENTANYL CITRATE (PF) 100 MCG/2ML IJ SOLN
25.0000 ug | INTRAMUSCULAR | Status: DC | PRN
Start: 2020-05-09 — End: 2020-05-09
  Administered 2020-05-09 (×3): 25 ug via INTRAVENOUS

## 2020-05-09 MED ORDER — HYDROCODONE-ACETAMINOPHEN 5-325 MG PO TABS: 1.0000 | ORAL_TABLET | ORAL | 0 refills | Status: DC | PRN

## 2020-05-09 MED ORDER — PROPOFOL 10 MG/ML IV BOLUS
INTRAVENOUS | Status: AC
  Filled 2020-05-09: qty 20

## 2020-05-09 MED ORDER — CELECOXIB 200 MG PO CAPS
400.0000 mg | ORAL_CAPSULE | Freq: Once | ORAL | Status: AC
  Administered 2020-05-09: 400 mg via ORAL

## 2020-05-09 MED ORDER — BUPIVACAINE-EPINEPHRINE 0.25% -1:200000 IJ SOLN
INTRAMUSCULAR | Status: DC | PRN
  Administered 2020-05-09: 5 mL
  Administered 2020-05-09: 25 mL

## 2020-05-09 MED ORDER — MORPHINE SULFATE 4 MG/ML IJ SOLN
INTRAMUSCULAR | Status: DC | PRN
  Administered 2020-05-09: 4 mg via INTRAVENOUS

## 2020-05-09 MED ORDER — SODIUM CHLORIDE 0.9 % IV SOLN: INTRAVENOUS | Status: DC

## 2020-05-09 MED ORDER — FENTANYL CITRATE (PF) 100 MCG/2ML IJ SOLN
INTRAMUSCULAR | Status: AC
  Filled 2020-05-09: qty 2

## 2020-05-09 MED ORDER — SEVOFLURANE IN SOLN
RESPIRATORY_TRACT | Status: AC
  Filled 2020-05-09: qty 250

## 2020-05-09 MED ORDER — ONDANSETRON HCL 4 MG/2ML IJ SOLN
INTRAMUSCULAR | Status: DC | PRN
  Administered 2020-05-09: 4 mg via INTRAVENOUS

## 2020-05-09 MED ORDER — PROPOFOL 10 MG/ML IV BOLUS
INTRAVENOUS | Status: DC | PRN
  Administered 2020-05-09: 130 mg via INTRAVENOUS

## 2020-05-09 MED ORDER — FENTANYL CITRATE (PF) 100 MCG/2ML IJ SOLN
INTRAMUSCULAR | Status: DC | PRN
  Administered 2020-05-09: 50 ug via INTRAVENOUS

## 2020-05-09 MED ORDER — ONDANSETRON HCL 4 MG/2ML IJ SOLN: 4.0000 mg | Freq: Four times a day (QID) | INTRAMUSCULAR | Status: DC | PRN

## 2020-05-09 MED ORDER — ROCURONIUM BROMIDE 10 MG/ML (PF) SYRINGE
PREFILLED_SYRINGE | INTRAVENOUS | Status: AC
  Filled 2020-05-09: qty 10

## 2020-05-09 MED ORDER — LIDOCAINE HCL (PF) 2 % IJ SOLN
INTRAMUSCULAR | Status: AC
  Filled 2020-05-09: qty 5

## 2020-05-09 MED ORDER — DEXAMETHASONE SODIUM PHOSPHATE 10 MG/ML IJ SOLN
INTRAMUSCULAR | Status: AC
  Filled 2020-05-09: qty 1

## 2020-05-09 MED ORDER — OXYCODONE HCL 5 MG PO TABS: 10.0000 mg | ORAL_TABLET | ORAL | Status: DC | PRN

## 2020-05-09 MED ORDER — ONDANSETRON HCL 4 MG/2ML IJ SOLN: 4.0000 mg | Freq: Once | INTRAMUSCULAR | Status: DC | PRN

## 2020-05-09 MED ORDER — OXYCODONE HCL 5 MG PO TABS
ORAL_TABLET | ORAL | Status: AC
  Filled 2020-05-09: qty 1

## 2020-05-09 MED ORDER — ROCURONIUM BROMIDE 100 MG/10ML IV SOLN
INTRAVENOUS | Status: DC | PRN
  Administered 2020-05-09: 30 mg via INTRAVENOUS
  Administered 2020-05-09: 50 mg via INTRAVENOUS

## 2020-05-09 MED ORDER — ACETAMINOPHEN 325 MG PO TABS: 325.0000 mg | ORAL_TABLET | Freq: Four times a day (QID) | ORAL | Status: DC | PRN

## 2020-05-09 MED ORDER — ONDANSETRON HCL 4 MG/2ML IJ SOLN
INTRAMUSCULAR | Status: AC
  Filled 2020-05-09: qty 2

## 2020-05-09 MED ORDER — DEXAMETHASONE SODIUM PHOSPHATE 10 MG/ML IJ SOLN
INTRAMUSCULAR | Status: DC | PRN
  Administered 2020-05-09: 10 mg via INTRAVENOUS

## 2020-05-09 MED ORDER — FENTANYL CITRATE (PF) 100 MCG/2ML IJ SOLN
INTRAMUSCULAR | Status: AC
  Administered 2020-05-09: 25 ug via INTRAVENOUS
  Filled 2020-05-09: qty 2

## 2020-05-09 MED ORDER — ACETAMINOPHEN 10 MG/ML IV SOLN
INTRAVENOUS | Status: DC | PRN
  Administered 2020-05-09: 1000 mg via INTRAVENOUS

## 2020-05-09 MED ORDER — SODIUM CHLORIDE 0.9 % IV SOLN
INTRAVENOUS | Status: DC | PRN
  Administered 2020-05-09 (×3): 100 ug via INTRAVENOUS

## 2020-05-09 MED ORDER — LIDOCAINE HCL (CARDIAC) PF 100 MG/5ML IV SOSY
PREFILLED_SYRINGE | INTRAVENOUS | Status: DC | PRN
  Administered 2020-05-09: 100 mg via INTRAVENOUS

## 2020-05-09 MED ORDER — DEXMEDETOMIDINE (PRECEDEX) IN NS 20 MCG/5ML (4 MCG/ML) IV SYRINGE
PREFILLED_SYRINGE | INTRAVENOUS | Status: AC
  Filled 2020-05-09: qty 5

## 2020-05-09 MED ORDER — LACTATED RINGERS IV SOLN: INTRAVENOUS | Status: DC | PRN

## 2020-05-09 MED ORDER — HYDROMORPHONE HCL 1 MG/ML IJ SOLN
0.5000 mg | INTRAMUSCULAR | Status: DC | PRN
Start: 2020-05-09 — End: 2020-05-09

## 2020-05-09 MED ORDER — SODIUM CHLORIDE 0.9 % IV SOLN
INTRAVENOUS | Status: DC | PRN
  Administered 2020-05-09: 30 ug/min via INTRAVENOUS

## 2020-05-09 MED ORDER — CHLORHEXIDINE GLUCONATE 0.12 % MT SOLN
15.0000 mL | Freq: Once | OROMUCOSAL | Status: AC
  Administered 2020-05-09: 15 mL via OROMUCOSAL

## 2020-05-09 MED ORDER — ORAL CARE MOUTH RINSE: 15.0000 mL | Freq: Once | OROMUCOSAL | Status: AC

## 2020-05-09 MED ORDER — SUGAMMADEX SODIUM 200 MG/2ML IV SOLN
INTRAVENOUS | Status: DC | PRN
  Administered 2020-05-09: 200 mg via INTRAVENOUS

## 2020-05-09 MED ORDER — OXYCODONE HCL 5 MG PO TABS
5.0000 mg | ORAL_TABLET | ORAL | Status: DC | PRN
  Administered 2020-05-09: 5 mg via ORAL

## 2020-05-09 MED ORDER — ACETAMINOPHEN 10 MG/ML IV SOLN
INTRAVENOUS | Status: AC
  Filled 2020-05-09: qty 100

## 2020-05-09 MED ORDER — ONDANSETRON HCL 4 MG PO TABS: 4.0000 mg | ORAL_TABLET | Freq: Four times a day (QID) | ORAL | Status: DC | PRN

## 2020-05-09 MED ORDER — MIDAZOLAM HCL 2 MG/2ML IJ SOLN
INTRAMUSCULAR | Status: AC
  Filled 2020-05-09: qty 2

## 2020-05-09 SURGICAL SUPPLY — 33 items
ADAPTER IRRIG TUBE 2 SPIKE SOL (ADAPTER) ×4 IMPLANT
ADPR TBG 2 SPK PMP STRL ASCP (ADAPTER) ×2
BLADE SHAVER 4.5 DBL SERAT CV (CUTTER) IMPLANT
COVER WAND RF STERILE (DRAPES) ×2 IMPLANT
CUFF TOURN SGL QUICK 24 (TOURNIQUET CUFF) ×2
CUFF TOURN SGL QUICK 30 (TOURNIQUET CUFF)
CUFF TRNQT CYL 24X4X16.5-23 (TOURNIQUET CUFF) IMPLANT
CUFF TRNQT CYL 30X4X21-28X (TOURNIQUET CUFF) IMPLANT
DRAPE ARTHRO LIMB 89X125 STRL (DRAPES) ×2 IMPLANT
DRSG DERMACEA 8X12 NADH (GAUZE/BANDAGES/DRESSINGS) ×2 IMPLANT
DURAPREP 26ML APPLICATOR (WOUND CARE) ×4 IMPLANT
GAUZE SPONGE 4X4 12PLY STRL (GAUZE/BANDAGES/DRESSINGS) ×2 IMPLANT
GLOVE SURG ENC TEXT LTX SZ7.5 (GLOVE) ×2 IMPLANT
GLOVE SURG UNDER LTX SZ8 (GLOVE) ×2 IMPLANT
GOWN STRL REUS W/ TWL LRG LVL3 (GOWN DISPOSABLE) ×2 IMPLANT
GOWN STRL REUS W/TWL LRG LVL3 (GOWN DISPOSABLE) ×4
IV LACTATED RINGER IRRG 3000ML (IV SOLUTION) ×20
IV LR IRRIG 3000ML ARTHROMATIC (IV SOLUTION) ×6 IMPLANT
KIT TURNOVER KIT A (KITS) ×2 IMPLANT
MANIFOLD NEPTUNE II (INSTRUMENTS) ×4 IMPLANT
PACK ARTHROSCOPY KNEE (MISCELLANEOUS) ×2 IMPLANT
PAD CAST CTTN 4X4 STRL (SOFTGOODS) IMPLANT
PADDING CAST COTTON 4X4 STRL (SOFTGOODS) ×2
SET TUBE SUCT SHAVER OUTFL 24K (TUBING) ×2 IMPLANT
SET TUBE TIP INTRA-ARTICULAR (MISCELLANEOUS) ×3 IMPLANT
SOL PREP PVP 2OZ (MISCELLANEOUS) ×2
SOLUTION PREP PVP 2OZ (MISCELLANEOUS) ×1 IMPLANT
STOCKINETTE BIAS CUT 6 980064 (GAUZE/BANDAGES/DRESSINGS) ×1 IMPLANT
SUT ETHILON 3-0 FS-10 30 BLK (SUTURE) ×2
SUTURE EHLN 3-0 FS-10 30 BLK (SUTURE) ×1 IMPLANT
TUBING ARTHRO INFLOW-ONLY STRL (TUBING) ×3 IMPLANT
WAND HAND CNTRL MULTIVAC 50 (MISCELLANEOUS) ×2 IMPLANT
WRAP KNEE W/COLD PACKS 25.5X14 (SOFTGOODS) ×2 IMPLANT

## 2020-05-09 NOTE — H&P (Signed)
The patient has been re-examined, and the chart reviewed, and there have been no interval changes to the documented history and physical.    The risks, benefits, and alternatives have been discussed at length. The patient expressed understanding of the risks benefits and agreed with plans for surgical intervention.  Kharlie Bring P. Marcina Kinnison, Jr. M.D.    

## 2020-05-09 NOTE — Discharge Instructions (Addendum)
Instructions after Knee Arthroscopy    James P. Holley Bouche., M.D.     Dept. of [facility]  [place], [place]  [address]   Phone: [phone]   Fax: [phone]   DIET: . Drink plenty of non-alcoholic fluids & begin a light diet. [name] Resume your normal diet the day after surgery.  ACTIVITY:     YOU ARE WEIGHT BEARING AS TOLERATED PER MD ORDERS. . You may use crutches or a walker with weight-bearing as tolerated, unless instructed otherwise. . You may wean yourself off of the walker or crutches as tolerated.  . Begin doing gentle exercises. Exercising will reduce the pain and swelling, increase motion, and prevent muscle weakness.   . Avoid strenuous activities or athletics for a minimum of 4-6 weeks after arthroscopic surgery. . Do not drive or operate any equipment until instructed.  WOUND CARE:  . Place one to two pillows under the knee the first day or two when sitting or lying.  . Continue to use the ice packs periodically to reduce pain and swelling. . The small incisions in your knee are closed with nylon stitches. The stitches will be removed in the office. . The bulky dressing may be removed on the second day after surgery. DO NOT TOUCH THE STITCHES. Put a Band-Aid over each stitch. Do NOT use any ointments or creams on the incisions.  . You may bathe or shower after the stitches are removed at the first office visit following surgery.  MEDICATIONS: . Dennis Bast may resume your regular medications. . Please take the pain medication as prescribed. . Do not take pain medication on an empty stomach. . Do not drive or drink alcoholic beverages when taking pain medications.  CALL THE OFFICE FOR: . Temperature above 101 degrees . Excessive bleeding or drainage on the dressing. . Excessive swelling, coldness, or paleness of the toes. . Persistent nausea and vomiting.  FOLLOW-UP:  . You should have an appointment to return to  the office in 7-10 days after surgery.     Gastroenterology [facility] Department Directory         [email or web address]       [email or web address]          Cardiology  Appointments: [place] [phone]  Endocrinology  Appointments: [place] [phone] [place] [phone]  Gastroenterology  Appointments: [place] [phone] [place] [phone]        General Surgery   Appointments: [facility]  Internal Medicine/Family Medicine  Appointments: [facility] [place] - [phone] [place] [phone]  Metabolic and [place] Loss Surgery  Appointments: [facility]        Neurology  Appointments: [place] [phone] [place] - [phone]  Neurosurgery  Appointments: [place]  Obstetrics & Gynecology  Appointments: [place] [phone] [place] - [phone]        Pediatrics  Appointments: [place] [phone] [place] - [phone]  Physiatry  Appointments: [place] [phone]  Physical Therapy  Appointments: [place] [phone]        Podiatry  Appointments: [place] [phone] [place] - [phone]  Pulmonology  Appointments: [place]  Rheumatology  Appointments: [place] [phone]        [place] Location: [facility]  [place], [place]  [address]  [place] Location: [facility] [address], [place]  [address]  [place] Location: [facility] [place], [place]  [place] INSTRUCTIONS   1) The drugs that you were given will stay in your system until tomorrow so for the next 24 hours you should not:  A) Drive an automobile B) Make any legal decisions C) Drink any alcoholic beverage   2) You may resume regular meals tomorrow.  Today it is better to start  with liquids and gradually work up to solid foods.  You may eat anything you prefer, but it is better to start with liquids, then soup and crackers, and gradually work up to solid foods.   3) Please notify your doctor immediately if you have any unusual bleeding, trouble breathing, redness and pain at the surgery site, drainage, fever, or pain not relieved by medication.    4) Additional Instructions:        Please contact your physician with any problems or Same Day Surgery at [phone], Monday through Friday 6 am to 4 pm, or [place] at [facility] number at [phone].

## 2020-05-09 NOTE — Op Note (Signed)
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   ASSISTANT: none  ANESTHESIA: general  ESTIMATED BLOOD LOSS: Minimal  FLUIDS REPLACED: 900 mL of crystalloid  TOURNIQUET TIME: Not used  INDICATIONS FOR SURGERY: [name] is a 75 y.o. year old female who has been seen for complaints of right knee pain.  Signs and symptoms were consistent with meniscal pathology. After discussion of the risks and benefits of surgical intervention, the patient expressed understanding of the risks benefits and agree with plans for right knee arthroscopy.   PROCEDURE IN DETAIL: The patient was brought into the operating room and, after adequate general anesthesia was achieved, a tourniquet was applied to the right thigh and the leg was placed in the leg holder. All bony prominences were well padded. The patient's right knee was cleaned and prepped with alcohol and Duraprep and draped in the usual sterile fashion. A "timeout" was performed as per usual protocol. The anticipated portal sites were injected with 0.25% Marcaine with epinephrine. An anterolateral incision was made and a cannula was inserted. A moderate effusion was evacuated and the knee was distended with fluid using the pump. The scope was advanced down the medial gutter into the medial compartment. Under visualization with the scope, an anteromedial portal was created and a hooked probe was inserted. The medial meniscus was visualized and probed.  There was a complex lesion involving the posterior horn medial meniscus.  A radial tear had propagated  and created a flap with a horizontal component noted as well.  The tear was debrided using meniscal punches and a 4.5 mm incisor shaver.  Final contouring was performed using the 50 degree ArthroCare wand.  The remaining rim of meniscus was visualized and probed and felt to be stable.  The articular cartilage was visualized.  There were grade 3 changes of chondromalacia involving the medial femoral condyle and medial tibial plateau.  These areas were debrided and contoured using the ArthroCare wand.  The scope was then advanced into the intercondylar notch. The anterior cruciate ligament was visualized and probed and felt to be intact. The scope was removed from the lateral portal and reinserted via the anteromedial portal to better visualize the lateral compartment. The lateral meniscus was visualized and probed and noted to be intact without instability or gross tear.  The articular cartilage of the lateral compartment was visualized and noted to be in good condition.  Finally, the scope was advanced so as to visualize the patellofemoral articulation. Good patellar tracking was appreciated.  The articular surface was in good condition.  The knee was irrigated with copius amounts of fluid and suctioned dry. The anterolateral portal was re-approximated with #3-0 nylon. A combination of 0.25% Marcaine with epinephrine and 4 mg of Morphine were injected via the scope. The scope was removed and the anteromedial portal was re-approximated with #3-0 nylon. A sterile dressing was applied followed by application of an ice wrap.  The patient tolerated the procedure well and was transported to the PACU in stable condition.  Ashland Osmer P. Holley Bouche., M.D.

## 2020-05-09 NOTE — Anesthesia Postprocedure Evaluation (Signed)
Anesthesia Post Note  Patient: [name]  Procedure(s) Performed: ARTHROSCOPY KNEE (Right Knee)  Patient location during evaluation: Short Stay Anesthesia Type: General Anesthetic complications: no   No complications documented.   Last Vitals:  Vitals:   05/09/20 1026 05/09/20 1313  BP: (!) 169/68 (!) 131/53  Pulse: 80   Resp: 16   Temp: (!) 36.3 C 36.6 C  SpO2: 100%     Last Pain:  Vitals:   05/09/20 1313  TempSrc:   PainSc: Asleep                 Ashyr Hedgepath,  [facility]

## 2020-05-09 NOTE — Anesthesia Preprocedure Evaluation (Signed)
Anesthesia Evaluation  Patient identified by MRN, date of birth, ID band Patient awake    Reviewed: Allergy & Precautions, NPO status , Patient's Chart, lab work & pertinent test results  History of Anesthesia Complications Negative for: history of anesthetic complications  Airway Mallampati: III  TM Distance: >3 FB Neck ROM: Full    Dental  (+) Dental Advidsory Given, Caps, Teeth Intact   Pulmonary neg shortness of breath, sleep apnea and Continuous Positive Airway Pressure Ventilation , neg COPD, neg recent URI,    breath sounds clear to auscultation- rhonchi (-) wheezing      Cardiovascular Exercise Tolerance: Good hypertension, (-) angina+ CAD (nonocclusive), + Peripheral Vascular Disease and +CHF (preserved EF)  (-) Past MI, (-) Cardiac Stents and (-) CABG (-) dysrhythmias + pacemaker (-) Valvular Problems/Murmurs Rhythm:Regular Rate:Normal - Systolic murmurs and - Diastolic murmurs 08/14/51: - Left ventricle: The cavity size was normal. Systolic function was   normal. The estimated ejection fraction was in the range of 60%   to 65%. Wall motion was normal; there were no regional wall   motion abnormalities. Features are consistent with a pseudonormal   left ventricular filling pattern, with concomitant abnormal   relaxation and increased filling pressure (grade 2 diastolic   dysfunction). - Left atrium: The atrium was normal in size. - Right ventricle: Systolic function was normal. - Pulmonary arteries: Systolic pressure was within the normal   range.    Neuro/Psych  Headaches, neg Seizures PSYCHIATRIC DISORDERS Anxiety Depression    GI/Hepatic Neg liver ROS, hiatal hernia, GERD  ,  Endo/Other  diabetes, Insulin DependentHypothyroidism   Renal/GU Renal InsufficiencyRenal disease     Musculoskeletal  (+) Arthritis ,   Abdominal (+) + obese,   Peds  Hematology negative hematology ROS (+)   Anesthesia Other  Findings Past Medical History: No date: Anxiety No date: Arthritis No date: CHF (congestive heart failure) (HCC) No date: Chronic back pain     Comment:  stenosis.degenerative disc,some scoliosis No date: Constipation     Comment:  takes Stool Softener daily No date: Coronary artery disease No date: Depression     Comment:  takes Cymbalta daily No date: Diabetes mellitus     Comment:  Type 2 diabetic. Average fasting blood sugar runs high               170-200 66/44/0347: Diastolic CHF (HCC)     Comment:  Per patient, diagnosed in 2018 No date: Diverticulosis No date: E coli infection No date: GERD (gastroesophageal reflux disease)     Comment:  takes Nexium daily No date: Headache No date: Hemorrhoids No date: History of colon polyps     Comment:  benign No date: History of gout     Comment:  doesn't take any meds No date: History of hiatal hernia No date: History of vertigo     Comment:  doesn't take any meds No date: Hyperlipidemia     Comment:  takes Praluent daily No date: Hypertension     Comment:  currently BP medications are on hold  No date: Hypothyroidism     Comment:  takes Synthroid daily No date: Insomnia     Comment:  takes Restoril nightly No date: Muscle spasm     Comment:  takes Robaxin as needed No date: NSVT (nonsustained ventricular tachycardia) (HCC) No date: OSA on CPAP No date: Peripheral vascular disease ([place])     Comment:  AAA as stated per pt / was just discovered and pt states  has not been referred to vascular MD  No date: Restless leg     Comment:  takes Requip daily No date: Rosacea No date: Varicose veins   Reproductive/Obstetrics                             Anesthesia Physical  Anesthesia Plan  ASA: III  Anesthesia Plan: General   Post-op Pain Management:    Induction: Intravenous  PONV Risk Score and Plan: 2 and Ondansetron, Dexamethasone and Treatment may vary due to age or medical  condition  Airway Management Planned: Oral ETT  Additional Equipment:   Intra-op Plan:   Post-operative Plan: Extubation in OR  Informed Consent: I have reviewed the patients History and Physical, chart, labs and discussed the procedure including the risks, benefits and alternatives for the proposed anesthesia with the patient or authorized representative who has indicated his/her understanding and acceptance.     Dental advisory given  Plan Discussed with: CRNA and Anesthesiologist  Anesthesia Plan Comments:         Anesthesia Quick Evaluation

## 2020-05-09 NOTE — Anesthesia Procedure Notes (Signed)
Procedure Name: Intubation Date/Time: 05/09/2020 11:30 AM Performed by: Martie Round, RN Pre-anesthesia Checklist: Patient identified, Patient being monitored, Timeout performed, Emergency Drugs available and Suction available Patient Re-evaluated:Patient Re-evaluated prior to induction Oxygen Delivery Method: Circle system utilized Preoxygenation: Pre-oxygenation with 100% oxygen Induction Type: IV induction Ventilation: Oral airway inserted - appropriate to patient size Laryngoscope Size: Mac, 3 and McGraph Grade View: Grade I Tube type: Oral Tube size: 7.0 mm Number of attempts: 1 Airway Equipment and Method: Stylet Placement Confirmation: ETT inserted through vocal cords under direct vision,  positive ETCO2 and breath sounds checked- equal and bilateral Secured at: 21 cm Tube secured with: Tape Dental Injury: Teeth and Oropharynx as per pre-operative assessment

## 2020-05-09 NOTE — Transfer of Care (Signed)
Immediate Anesthesia Transfer of Care Note  Patient: [name]  Procedure(s) Performed: ARTHROSCOPY KNEE (Right Knee)  Patient Location: PACU  Anesthesia Type:General  Level of Consciousness: drowsy and patient cooperative  Airway & Oxygen Therapy: Patient Spontanous Breathing and Patient connected to face mask oxygen  Post-op Assessment: Report given to RN and Post -op Vital signs reviewed and stable  Post vital signs: Reviewed  Last Vitals:  Vitals Value Taken Time  BP 131/53 05/09/20 1308  Temp    Pulse 78 05/09/20 1312  Resp 18 05/09/20 1312  SpO2 100 % 05/09/20 1312  Vitals shown include unvalidated device data.  Last Pain:  Vitals:   05/09/20 1026  TempSrc: Temporal  PainSc: 2          Complications: No complications documented.

## 2020-05-09 NOTE — Anesthesia Postprocedure Evaluation (Signed)
Anesthesia Post Note  Patient: [name]  Procedure(s) Performed: ARTHROSCOPY KNEE (Right Knee)  Patient location during evaluation: PACU Anesthesia Type: General Level of consciousness: awake and alert Pain management: pain level controlled Vital Signs Assessment: post-procedure vital signs reviewed and stable Respiratory status: spontaneous breathing, nonlabored ventilation, respiratory function stable and patient connected to nasal cannula oxygen Cardiovascular status: blood pressure returned to baseline and stable Postop Assessment: no apparent nausea or vomiting Anesthetic complications: no   No complications documented.   Last Vitals:  Vitals:   05/09/20 1424 05/09/20 1441  BP: (!) 136/52 (!) 123/54  Pulse: 80 78  Resp: 18 16  Temp: 36.6 C   SpO2: 100% 98%    Last Pain:  Vitals:   05/09/20 1441  TempSrc:   PainSc: 3                  Martha Clan

## 2020-05-10 ENCOUNTER — Encounter: Payer: Self-pay | Admitting: Orthopedic Surgery

## 2020-05-31 LAB — CUP PACEART REMOTE DEVICE CHECK
Battery Remaining Percentage: 90 %
Brady Statistic RA Percent Paced: 18 %
Brady Statistic RV Percent Paced: 0 %
Date Time Interrogation Session: 20220419155256
Implantable Lead Implant Date: 20210120
Implantable Lead Implant Date: 20210120
Implantable Lead Location: 753859
Implantable Lead Location: 753860
Implantable Lead Model: 5076
Implantable Lead Model: 5076
Implantable Pulse Generator Implant Date: 20210120
Lead Channel Impedance Value: 371 Ohm
Lead Channel Impedance Value: 741 Ohm
Lead Channel Pacing Threshold Amplitude: 0.4 V
Lead Channel Pacing Threshold Amplitude: 0.6 V
Lead Channel Pacing Threshold Pulse Width: 0.4 ms
Lead Channel Pacing Threshold Pulse Width: 0.4 ms
Lead Channel Sensing Intrinsic Amplitude: 3.1 mV
Lead Channel Sensing Intrinsic Amplitude: 7.6 mV
Lead Channel Setting Pacing Amplitude: 3 V
Lead Channel Setting Pacing Amplitude: 3 V
Lead Channel Setting Pacing Pulse Width: 0.4 ms
Pulse Gen Model: 407145
Pulse Gen Serial Number: 69765687

## 2020-06-01 ENCOUNTER — Ambulatory Visit (INDEPENDENT_AMBULATORY_CARE_PROVIDER_SITE_OTHER): Payer: Medicare Other

## 2020-06-01 DIAGNOSIS — I471 Supraventricular tachycardia: Secondary | ICD-10-CM

## 2020-06-20 NOTE — Progress Notes (Signed)
Remote pacemaker transmission.   

## 2020-06-22 ENCOUNTER — Encounter: Payer: Self-pay | Admitting: Dermatology

## 2020-06-22 ENCOUNTER — Other Ambulatory Visit: Payer: Self-pay

## 2020-06-22 ENCOUNTER — Ambulatory Visit (INDEPENDENT_AMBULATORY_CARE_PROVIDER_SITE_OTHER): Payer: Medicare Other | Admitting: Dermatology

## 2020-06-22 ENCOUNTER — Ambulatory Visit: Payer: Medicare Other | Admitting: Dermatology

## 2020-06-22 DIAGNOSIS — L578 Other skin changes due to chronic exposure to nonionizing radiation: Secondary | ICD-10-CM

## 2020-06-22 DIAGNOSIS — L820 Inflamed seborrheic keratosis: Secondary | ICD-10-CM | POA: Diagnosis not present

## 2020-06-22 DIAGNOSIS — L719 Rosacea, unspecified: Secondary | ICD-10-CM

## 2020-06-22 DIAGNOSIS — Z1283 Encounter for screening for malignant neoplasm of skin: Secondary | ICD-10-CM

## 2020-06-22 DIAGNOSIS — Q828 Other specified congenital malformations of skin: Secondary | ICD-10-CM

## 2020-06-22 DIAGNOSIS — L814 Other melanin hyperpigmentation: Secondary | ICD-10-CM

## 2020-06-22 DIAGNOSIS — I872 Venous insufficiency (chronic) (peripheral): Secondary | ICD-10-CM

## 2020-06-22 DIAGNOSIS — Z86018 Personal history of other benign neoplasm: Secondary | ICD-10-CM

## 2020-06-22 DIAGNOSIS — D1800 Hemangioma unspecified site: Secondary | ICD-10-CM

## 2020-06-22 DIAGNOSIS — L821 Other seborrheic keratosis: Secondary | ICD-10-CM

## 2020-06-22 DIAGNOSIS — L280 Lichen simplex chronicus: Secondary | ICD-10-CM

## 2020-06-22 DIAGNOSIS — D229 Melanocytic nevi, unspecified: Secondary | ICD-10-CM

## 2020-06-22 MED ORDER — FINACEA 15 % EX FOAM
1.0000 | CUTANEOUS | 4 refills | Status: DC
Start: 2020-06-22 — End: 2021-06-29

## 2020-06-22 MED ORDER — MOMETASONE FUROATE 0.1 % EX CREA: 1.0000 "application " | TOPICAL_CREAM | Freq: Every day | CUTANEOUS | 1 refills | Status: AC | PRN

## 2020-06-22 NOTE — Patient Instructions (Signed)

## 2020-06-22 NOTE — Progress Notes (Signed)
Follow-Up Visit   Subjective  [name] is a 75 y.o. female who presents for the following: Total body skin exam (Hx of Dysplastic Nevi), Rash (R leg, 1.5 wks, itchy), bumps (Groin, >40yr, no symptoms), and Rosacea (Face, finacea foam bid). The patient presents for Total-Body Skin Exam (TBSE) for skin cancer screening and mole check.  The following portions of the chart were reviewed this encounter and updated as appropriate:   Tobacco  Allergies  Meds  Problems  Med Hx  Surg Hx  Fam Hx     Review of Systems:  No other skin or systemic complaints except as noted in HPI or Assessment and Plan.  Objective  Well appearing patient in no apparent distress; mood and affect are within normal limits.  A full examination was performed including scalp, head, eyes, ears, nose, lips, neck, chest, axillae, abdomen, back, buttocks, bilateral upper extremities, bilateral lower extremities, hands, feet, fingers, toes, fingernails, and toenails. All findings within normal limits unless otherwise noted below.  Objective  L sup buttock, R sup med buttock: Scar with no evidence of recurrence.   Objective  L mid pretibial: ~1.6cm  Objective  Right Lower Leg - Anterior: Stasis changes R lower leg  Objective  face: Erythema cheeks  Objective  R glabella x 1: Erythematous keratotic or waxy stuck-on papule or plaque.   Objective  back: Scarring back  Objective  Pubic: Stuck-on, waxy, tan-brown papules and plaques -- Discussed benign etiology and prognosis.    Assessment & Plan    Lentigines - Scattered tan macules - Due to sun exposure - Benign-appering, observe - Recommend daily broad spectrum sunscreen SPF 30+ to sun-exposed areas, reapply every 2 hours as needed. - Call for any changes  Seborrheic Keratoses - Stuck-on, waxy, tan-brown papules and/or plaques  - Benign-appearing - Discussed benign etiology and prognosis. - Observe - Call for any  changes  Melanocytic Nevi - Tan-brown and/or pink-flesh-colored symmetric macules and papules - Benign appearing on exam today - Observation - Call clinic for new or changing moles - Recommend daily use of broad spectrum spf 30+ sunscreen to sun-exposed areas.   Hemangiomas - Red papules - Discussed benign nature - Observe - Call for any changes  Actinic Damage - Chronic condition, secondary to cumulative UV/sun exposure - diffuse scaly erythematous macules with underlying dyspigmentation - Recommend daily broad spectrum sunscreen SPF 30+ to sun-exposed areas, reapply every 2 hours as needed.  - Staying in the shade or wearing long sleeves, sun glasses (UVA+UVB protection) and wide brim hats (4-inch brim around the entire circumference of the hat) are also recommended for sun protection.  - Call for new or changing lesions.  Skin cancer screening performed today.  History of dysplastic nevus L sup buttock, R sup med buttock Clear. Observe for recurrence. Call clinic for new or changing lesions.  Recommend regular skin exams, daily broad-spectrum spf 30+ sunscreen use, and photoprotection.     Porokeratosis L mid pretibial Benign appearing, observe for changes.  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 It is treatable but not curable. It sometimes affects  the eyes (ocular rosacea) as well. It may respond to topical and/or systemic medication and can flare with stress, sun exposure, alcohol, exercise and some foods.  Daily application of broad spectrum spf 30+  sunscreen to face is recommended to reduce flares.  Cont Finacea foam bid Azelaic Acid (FINACEA) 15 % FOAM - face  Inflamed seborrheic keratosis R glabella x 1 With associated sebaceous hyperplasia Destruction of lesion - R glabella x 1 Complexity: simple   Destruction method: cryotherapy   Informed consent: discussed and consent obtained   Timeout:  patient name, date of birth, surgical site, and procedure verified Lesion destroyed using liquid nitrogen: Yes   Region frozen until ice ball extended beyond lesion: Yes   Outcome: patient tolerated procedure well with no complications   Post-procedure details: wound care instructions given    Neurodermatitis back Cont Gabapentin as prescribed  Seborrheic keratosis Pubic Benign, observe  Skin cancer screening  Return in about 1 year (around 06/22/2021) for TBSE, Hx of Dysplastic nevi.  I, Othelia Pulling, RMA, am acting as scribe for Sarina Ser, MD .  Documentation: I have reviewed the above documentation for accuracy and completeness, and I agree with the above.  Sarina Ser, MD

## 2020-06-28 ENCOUNTER — Encounter: Payer: Self-pay | Admitting: Dermatology

## 2020-07-01 ENCOUNTER — Other Ambulatory Visit: Payer: Self-pay | Admitting: Neurosurgery

## 2020-07-01 DIAGNOSIS — M4807 Spinal stenosis, lumbosacral region: Secondary | ICD-10-CM

## 2020-07-01 DIAGNOSIS — Z981 Arthrodesis status: Secondary | ICD-10-CM

## 2020-07-06 ENCOUNTER — Ambulatory Visit: Payer: Medicare Other

## 2020-07-21 ENCOUNTER — Ambulatory Visit
Admission: RE | Admit: 2020-07-21 | Discharge: 2020-07-21 | Disposition: A | Discharge status: Home / Self Care | Payer: Medicare Other | Source: Ambulatory Visit | Attending: Neurosurgery | Admitting: Neurosurgery

## 2020-07-21 ENCOUNTER — Other Ambulatory Visit: Payer: Self-pay

## 2020-07-21 DIAGNOSIS — I700 Atherosclerosis of aorta: Secondary | ICD-10-CM | POA: Diagnosis not present

## 2020-07-21 DIAGNOSIS — M4807 Spinal stenosis, lumbosacral region: Secondary | ICD-10-CM | POA: Diagnosis present

## 2020-07-21 DIAGNOSIS — Z981 Arthrodesis status: Secondary | ICD-10-CM | POA: Insufficient documentation

## 2020-07-21 DIAGNOSIS — M47814 Spondylosis without myelopathy or radiculopathy, thoracic region: Secondary | ICD-10-CM | POA: Diagnosis not present

## 2020-07-21 DIAGNOSIS — M48061 Spinal stenosis, lumbar region without neurogenic claudication: Secondary | ICD-10-CM | POA: Insufficient documentation

## 2020-07-21 DIAGNOSIS — M4804 Spinal stenosis, thoracic region: Secondary | ICD-10-CM | POA: Insufficient documentation

## 2020-07-21 MED ORDER — IOHEXOL 300 MG/ML  SOLN
10.0000 mL | Freq: Once | INTRAMUSCULAR | Status: AC | PRN
  Administered 2020-07-21: 10 mL

## 2020-07-21 MED ORDER — LIDOCAINE HCL (PF) 1 % IJ SOLN
10.0000 mL | Freq: Once | INTRAMUSCULAR | Status: AC
  Administered 2020-07-21: 5 mL
  Filled 2020-07-21: qty 10

## 2020-07-21 NOTE — Progress Notes (Signed)
Patient vitals remain stable post Myelogram, discharge instructions given with questions answered. Ready for discharge per Dr Register.

## 2020-07-21 NOTE — Progress Notes (Signed)
Pt stable after myelogram.Back stable.D/C instructions given.F/U with her MD.Thankyou.

## 2020-08-31 ENCOUNTER — Ambulatory Visit (INDEPENDENT_AMBULATORY_CARE_PROVIDER_SITE_OTHER): Payer: Medicare Other

## 2020-08-31 DIAGNOSIS — R55 Syncope and collapse: Secondary | ICD-10-CM

## 2020-08-31 LAB — CUP PACEART REMOTE DEVICE CHECK
Date Time Interrogation Session: 20220719085759
Implantable Lead Implant Date: 20210120
Implantable Lead Implant Date: 20210120
Implantable Lead Location: 753859
Implantable Lead Location: 753860
Implantable Lead Model: 5076
Implantable Lead Model: 5076
Implantable Pulse Generator Implant Date: 20210120
Pulse Gen Model: 407145
Pulse Gen Serial Number: 69765687

## 2020-09-07 ENCOUNTER — Other Ambulatory Visit: Payer: Self-pay

## 2020-09-07 ENCOUNTER — Emergency Department: Payer: Medicare Other

## 2020-09-07 ENCOUNTER — Observation Stay
Admission: EM | Admit: 2020-09-07 | Discharge: 2020-09-08 | Disposition: A | Discharge status: Home / Self Care | Payer: Medicare Other | Attending: Student in an Organized Health Care Education/Training Program | Admitting: Student in an Organized Health Care Education/Training Program

## 2020-09-07 DIAGNOSIS — I25110 Atherosclerotic heart disease of native coronary artery with unstable angina pectoris: Secondary | ICD-10-CM | POA: Diagnosis not present

## 2020-09-07 DIAGNOSIS — I5032 Chronic diastolic (congestive) heart failure: Secondary | ICD-10-CM | POA: Diagnosis not present

## 2020-09-07 DIAGNOSIS — Z7982 Long term (current) use of aspirin: Secondary | ICD-10-CM | POA: Insufficient documentation

## 2020-09-07 DIAGNOSIS — E1122 Type 2 diabetes mellitus with diabetic chronic kidney disease: Secondary | ICD-10-CM | POA: Diagnosis not present

## 2020-09-07 DIAGNOSIS — Z794 Long term (current) use of insulin: Secondary | ICD-10-CM | POA: Insufficient documentation

## 2020-09-07 DIAGNOSIS — G8929 Other chronic pain: Secondary | ICD-10-CM | POA: Diagnosis present

## 2020-09-07 DIAGNOSIS — Z950 Presence of cardiac pacemaker: Secondary | ICD-10-CM | POA: Insufficient documentation

## 2020-09-07 DIAGNOSIS — I130 Hypertensive heart and chronic kidney disease with heart failure and stage 1 through stage 4 chronic kidney disease, or unspecified chronic kidney disease: Secondary | ICD-10-CM | POA: Diagnosis not present

## 2020-09-07 DIAGNOSIS — R079 Chest pain, unspecified: Secondary | ICD-10-CM | POA: Diagnosis present

## 2020-09-07 DIAGNOSIS — E041 Nontoxic single thyroid nodule: Secondary | ICD-10-CM

## 2020-09-07 DIAGNOSIS — E039 Hypothyroidism, unspecified: Secondary | ICD-10-CM | POA: Diagnosis not present

## 2020-09-07 DIAGNOSIS — I2510 Atherosclerotic heart disease of native coronary artery without angina pectoris: Secondary | ICD-10-CM | POA: Diagnosis present

## 2020-09-07 DIAGNOSIS — Z9989 Dependence on other enabling machines and devices: Secondary | ICD-10-CM

## 2020-09-07 DIAGNOSIS — I25118 Atherosclerotic heart disease of native coronary artery with other forms of angina pectoris: Secondary | ICD-10-CM | POA: Diagnosis present

## 2020-09-07 DIAGNOSIS — G4733 Obstructive sleep apnea (adult) (pediatric): Secondary | ICD-10-CM

## 2020-09-07 DIAGNOSIS — Z79899 Other long term (current) drug therapy: Secondary | ICD-10-CM | POA: Insufficient documentation

## 2020-09-07 DIAGNOSIS — E785 Hyperlipidemia, unspecified: Secondary | ICD-10-CM | POA: Diagnosis present

## 2020-09-07 DIAGNOSIS — R0789 Other chest pain: Secondary | ICD-10-CM | POA: Diagnosis present

## 2020-09-07 DIAGNOSIS — Z20822 Contact with and (suspected) exposure to covid-19: Secondary | ICD-10-CM | POA: Diagnosis not present

## 2020-09-07 DIAGNOSIS — E119 Type 2 diabetes mellitus without complications: Secondary | ICD-10-CM

## 2020-09-07 DIAGNOSIS — I200 Unstable angina: Secondary | ICD-10-CM

## 2020-09-07 DIAGNOSIS — I10 Essential (primary) hypertension: Secondary | ICD-10-CM | POA: Diagnosis present

## 2020-09-07 DIAGNOSIS — N183 Chronic kidney disease, stage 3 unspecified: Secondary | ICD-10-CM | POA: Diagnosis present

## 2020-09-07 DIAGNOSIS — M545 Low back pain, unspecified: Secondary | ICD-10-CM

## 2020-09-07 DIAGNOSIS — N1832 Chronic kidney disease, stage 3b: Secondary | ICD-10-CM | POA: Insufficient documentation

## 2020-09-07 DIAGNOSIS — K219 Gastro-esophageal reflux disease without esophagitis: Secondary | ICD-10-CM | POA: Diagnosis present

## 2020-09-07 HISTORY — DX: Bradycardia, unspecified: R00.1

## 2020-09-07 HISTORY — DX: Other supraventricular tachycardia: I47.19

## 2020-09-07 HISTORY — DX: Supraventricular tachycardia: I47.1

## 2020-09-07 HISTORY — DX: Chronic diastolic (congestive) heart failure: I50.32

## 2020-09-07 HISTORY — DX: Atherosclerotic heart disease of native coronary artery without angina pectoris: I25.10

## 2020-09-07 LAB — CBC
HCT: 38.1 % (ref 36.0–46.0)
Hemoglobin: 12.3 g/dL (ref 12.0–15.0)
MCH: 27.2 pg (ref 26.0–34.0)
MCHC: 32.3 g/dL (ref 30.0–36.0)
MCV: 84.1 fL (ref 80.0–100.0)
Platelets: 207 10*3/uL (ref 150–400)
RBC: 4.53 MIL/uL (ref 3.87–5.11)
RDW: 14.9 % (ref 11.5–15.5)
WBC: 6.6 10*3/uL (ref 4.0–10.5)
nRBC: 0 % (ref 0.0–0.2)

## 2020-09-07 LAB — BASIC METABOLIC PANEL
Anion gap: 13 (ref 5–15)
BUN: 29 mg/dL — ABNORMAL HIGH (ref 8–23)
CO2: 27 mmol/L (ref 22–32)
Calcium: 9.5 mg/dL (ref 8.9–10.3)
Chloride: 97 mmol/L — ABNORMAL LOW (ref 98–111)
Creatinine, Ser: 1.42 mg/dL — ABNORMAL HIGH (ref 0.44–1.00)
GFR, Estimated: 39 mL/min — ABNORMAL LOW (ref >60)
Glucose, Bld: 189 mg/dL — ABNORMAL HIGH (ref 70–99)
Potassium: 4.2 mmol/L (ref 3.5–5.1)
Sodium: 137 mmol/L (ref 135–145)

## 2020-09-07 LAB — HEPATIC FUNCTION PANEL
ALT: 17 U/L (ref 0–44)
AST: 16 U/L (ref 15–41)
Albumin: 4.1 g/dL (ref 3.5–5.0)
Alkaline Phosphatase: 143 U/L — ABNORMAL HIGH (ref 38–126)
Bilirubin, Direct: <0.1 mg/dL (ref 0.0–0.2)
Total Bilirubin: 0.6 mg/dL (ref 0.3–1.2)
Total Protein: 7.9 g/dL (ref 6.5–8.1)

## 2020-09-07 LAB — TROPONIN I (HIGH SENSITIVITY)
Troponin I (High Sensitivity): 2 ng/L (ref <18)
Troponin I (High Sensitivity): 2 ng/L (ref <18)
Troponin I (High Sensitivity): 3 ng/L (ref <18)

## 2020-09-07 LAB — LIPASE, BLOOD: Lipase: 54 U/L — ABNORMAL HIGH (ref 11–51)

## 2020-09-07 LAB — RESP PANEL BY RT-PCR (FLU A&B, COVID) ARPGX2
Influenza A by PCR: NEGATIVE
Influenza B by PCR: NEGATIVE
SARS Coronavirus 2 by RT PCR: NEGATIVE

## 2020-09-07 LAB — BRAIN NATRIURETIC PEPTIDE: B Natriuretic Peptide: 54.6 pg/mL (ref 0.0–100.0)

## 2020-09-07 LAB — D-DIMER, QUANTITATIVE: D-Dimer, Quant: 0.62 ug/mL-FEU — ABNORMAL HIGH (ref 0.00–0.50)

## 2020-09-07 MED ORDER — HEPARIN SODIUM (PORCINE) 5000 UNIT/ML IJ SOLN
5000.0000 [IU] | Freq: Three times a day (TID) | INTRAMUSCULAR | Status: DC
  Administered 2020-09-07 – 2020-09-08 (×2): 5000 [IU] via SUBCUTANEOUS
  Filled 2020-09-07 (×3): qty 1

## 2020-09-07 MED ORDER — MORPHINE SULFATE (PF) 2 MG/ML IV SOLN: 1.0000 mg | INTRAVENOUS | Status: DC | PRN

## 2020-09-07 MED ORDER — TRAMADOL HCL 50 MG PO TABS: 50.0000 mg | ORAL_TABLET | Freq: Four times a day (QID) | ORAL | Status: DC | PRN

## 2020-09-07 MED ORDER — IOHEXOL 350 MG/ML SOLN
75.0000 mL | Freq: Once | INTRAVENOUS | Status: AC | PRN
  Administered 2020-09-07: 60 mL via INTRAVENOUS

## 2020-09-07 MED ORDER — MORPHINE SULFATE (PF) 2 MG/ML IV SOLN
2.0000 mg | INTRAVENOUS | Status: DC | PRN
  Administered 2020-09-07: 2 mg via INTRAVENOUS
  Filled 2020-09-07: qty 1

## 2020-09-07 MED ORDER — INSULIN GLARGINE-YFGN 100 UNIT/ML ~~LOC~~ SOLN
20.0000 [IU] | Freq: Every day | SUBCUTANEOUS | Status: DC
  Administered 2020-09-07: 20 [IU] via SUBCUTANEOUS
  Filled 2020-09-07 (×2): qty 0.2

## 2020-09-07 MED ORDER — ONDANSETRON HCL 4 MG/2ML IJ SOLN
4.0000 mg | Freq: Four times a day (QID) | INTRAMUSCULAR | Status: DC | PRN
  Administered 2020-09-08: 4 mg via INTRAVENOUS
  Filled 2020-09-07: qty 2

## 2020-09-07 MED ORDER — GABAPENTIN 300 MG PO CAPS
300.0000 mg | ORAL_CAPSULE | Freq: Three times a day (TID) | ORAL | Status: DC
  Administered 2020-09-08 (×2): 300 mg via ORAL
  Filled 2020-09-07 (×3): qty 1

## 2020-09-07 MED ORDER — ROPINIROLE HCL 1 MG PO TABS
5.0000 mg | ORAL_TABLET | Freq: Every day | ORAL | Status: DC
  Filled 2020-09-07: qty 5

## 2020-09-07 MED ORDER — INSULIN ASPART 100 UNIT/ML IJ SOLN: 0.0000 [IU] | Freq: Three times a day (TID) | INTRAMUSCULAR | Status: DC

## 2020-09-07 MED ORDER — LEVOTHYROXINE SODIUM 50 MCG PO TABS
75.0000 ug | ORAL_TABLET | Freq: Every day | ORAL | Status: DC
  Administered 2020-09-08: 75 ug via ORAL
  Filled 2020-09-07: qty 2

## 2020-09-07 MED ORDER — NITROGLYCERIN 0.4 MG SL SUBL: 0.4000 mg | SUBLINGUAL_TABLET | SUBLINGUAL | Status: DC | PRN

## 2020-09-07 MED ORDER — ACETAMINOPHEN 325 MG PO TABS: 650.0000 mg | ORAL_TABLET | ORAL | Status: DC | PRN

## 2020-09-07 MED ORDER — ASPIRIN EC 81 MG PO TBEC: 81.0000 mg | DELAYED_RELEASE_TABLET | Freq: Every day | ORAL | Status: DC

## 2020-09-07 MED ORDER — METHOCARBAMOL 500 MG PO TABS
500.0000 mg | ORAL_TABLET | Freq: Three times a day (TID) | ORAL | Status: DC | PRN
  Filled 2020-09-07: qty 1

## 2020-09-07 MED ORDER — AZELASTINE HCL 0.1 % NA SOLN
2.0000 | Freq: Two times a day (BID) | NASAL | Status: DC | PRN
  Filled 2020-09-07: qty 30

## 2020-09-07 MED ORDER — ONDANSETRON HCL 4 MG/2ML IJ SOLN
4.0000 mg | Freq: Once | INTRAMUSCULAR | Status: AC
  Administered 2020-09-07: 4 mg via INTRAVENOUS
  Filled 2020-09-07: qty 2

## 2020-09-07 MED ORDER — TORSEMIDE 20 MG PO TABS
20.0000 mg | ORAL_TABLET | Freq: Every day | ORAL | Status: DC
  Administered 2020-09-08: 20 mg via ORAL
  Filled 2020-09-07: qty 1

## 2020-09-07 NOTE — ED Provider Notes (Signed)
[facility] Emergency Department Provider Note  ____________________________________________   Event Date/Time   First MD Initiated Contact with Patient 09/07/20 1205     (approximate)  I have reviewed the triage vital signs and the nursing notes.   HISTORY  Chief Complaint Chest Pain   HPI [name] is a 75 y.o. female with past medical history of CKD, coronary artery disease, CHF, pacemaker placement and OSA on CPAP who reports to the emergency department for evaluation of left arm pain that began yesterday.  She states that the pain initially began yesterday morning, not relieved by any repositioning of the arm.  She attempted a nitro, with no improvement with first nitro dose but did have improvement with second dose.  She reports initially this was not associated with chest pain, but later in the day she did have severe episode of associated chest pain that was made slightly improved by nitro but not completely relieved.  This 1 episode of chest pain was associated with diaphoresis.  She woke up this morning with another episode of the left arm pain now associated with nausea.  She also reports an increase shortness of breath today compared to her normal.  She reports that she checked her O2 at home with oximeter but did not note any changes.  She denies any abdominal pain, dysuria, vomiting, constipation or diarrhea, cough, congestion or other illness symptoms.  Of note, she is also reporting right-sided calf pain that began recently and she reports a recent right meniscal surgery.         Past Medical History:  Diagnosis Date   Acquired elevated diaphragm    HIGHER ON RIGHT SIDE   Anemia    Anxiety    Aortic atherosclerosis (HCC)    Arthritis    CHF (congestive heart failure) (HCC)    Chronic back pain    stenosis.degenerative disc,some scoliosis   Chronic kidney disease    stage 3   Complication of anesthesia 2018   aspirated during wrist  surgery during LMA removal   Constipation    takes Stool Softener daily   Coronary artery disease    Depression    takes Cymbalta daily   Diabetes mellitus    Type 2 diabetic. Average fasting blood sugar runs high 758-832   Diastolic CHF ([place]) 54/98/2641   Per patient, diagnosed in 2018   Diverticulosis    Dysplastic nevus 09/12/2017   L sup buttock - mild    Dysplastic nevus 09/18/2018   R sup med buttocks - mild    E coli infection    GERD (gastroesophageal reflux disease)    takes Nexium daily   Grade II diastolic dysfunction    Headache    Hemorrhoids    History of colon polyps    benign   History of gout    doesn't take any meds   History of hiatal hernia    small   History of vertigo    doesn't take any meds   Hyperlipidemia    takes Praluent daily   Hypertension    currently BP medications are on hold    Hypothyroidism    takes Synthroid daily   Insomnia    takes Restoril nightly   Muscle spasm    takes Robaxin as needed   NSVT (nonsustained ventricular tachycardia) (HCC)    OSA on CPAP    Peripheral vascular disease ([place])    AAA as stated per pt / was just discovered and  pt states has not been referred to vascular MD    Presence of permanent cardiac pacemaker    Restless leg    takes Requip daily   Rosacea    Syncope    Varicose veins     Patient Active Problem List   Diagnosis Date Noted   Chest pain, rule out acute myocardial infarction 09/07/2020   GERD (gastroesophageal reflux disease) 05/08/2020   Restless leg syndrome 05/08/2020   Coronary disease 05/08/2020   Status post placement of cardiac pacemaker 04/29/2019   Body mass index (BMI) 38.0-38.9, adult 02/24/2019   Spondylolisthesis, lumbar region 02/24/2019   Tubular adenoma 12/10/2018   CKD (chronic kidney disease) stage 3, GFR 30-59 ml/min (HCC) 02/27/2018   Lumbar spondylosis 02/18/2018   Inflammatory arthritis 02/13/2018   Bilateral hand pain 01/22/2018   Acquired trigger finger  01/22/2018   Dupuytren's disease of palm 01/22/2018   Elevated erythrocyte sedimentation rate 01/14/2018   Polyarthralgia 01/14/2018   Atrial tachycardia ([place]) 01/05/2018   History of shingles 12/26/2017   Abnormal cardiac CT angiography    Diverticulitis 07/27/2017   Diverticulitis of large intestine without perforation or abscess without bleeding 07/26/2017   Medicare annual wellness visit, initial 04/15/2017   Sciatica 01/15/2017   Lymphedema 12/18/2016   Adult idiopathic generalized osteoporosis 10/08/2016   Chronic diastolic heart failure ([place]) 08/10/2016   Hypertensive heart disease 07/25/2016   Morbid obesity ([place]) 07/25/2016   Physical deconditioning 07/25/2016   Pyuria 06/25/2016   Abdominal pain 06/24/2016   Abscess of back    Lumbar degenerative disc disease 11/14/2015   Chronic low back pain 08/26/2015   Lumbar radiculopathy 08/26/2015   Anxiety 02/24/2014   Lumbar stenosis with neurogenic claudication 11/03/2013   Chest pain 06/09/2012   Atherosclerosis of native coronary artery with stable angina pectoris ([place]) 06/09/2012   Hyperlipidemia 08/09/2011   Arm pain 06/28/2011   Diabetes mellitus ([place]) 05/05/2010   OSA on CPAP 05/05/2010   HTN (hypertension) 05/05/2010   Edema 05/05/2010   Hypothyroidism 02/12/2010   HYPERTRIGLYCERIDEMIA 06/08/2008   Exertional dyspnea 06/08/2008    Past Surgical History:  Procedure Laterality Date   ABDOMINAL HYSTERECTOMY     with BSo   CARDIAC CATHETERIZATION  2013   Normal   CARPAL TUNNEL RELEASE Bilateral    CATARACT EXTRACTION, BILATERAL     CHOLECYSTECTOMY     COLONOSCOPY     COLONOSCOPY WITH PROPOFOL N/A 11/25/2018   Procedure: COLONOSCOPY WITH PROPOFOL;  Surgeon: Toledo, Benay Pike, MD;  Location: [facility] ENDOSCOPY;  Service: Gastroenterology;  Laterality: N/A;   DIAGNOSTIC LAPAROSCOPY     multiple times   DILATION AND CURETTAGE OF UTERUS     ESOPHAGOGASTRODUODENOSCOPY (EGD) WITH PROPOFOL N/A 11/01/2014   Procedure:  ESOPHAGOGASTRODUODENOSCOPY (EGD) WITH PROPOFOL;  Surgeon: Hulen Luster, MD;  Location: [facility] ENDOSCOPY;  Service: Gastroenterology;  Laterality: N/A;   ESOPHAGOGASTRODUODENOSCOPY (EGD) WITH PROPOFOL N/A 11/25/2018   Procedure: ESOPHAGOGASTRODUODENOSCOPY (EGD) WITH PROPOFOL;  Surgeon: Toledo, Benay Pike, MD;  Location: [facility] ENDOSCOPY;  Service: Gastroenterology;  Laterality: N/A;   HARDWARE REMOVAL Left 10/11/2016   Procedure: HARDWARE REMOVAL-LEFT RADIUS;  Surgeon: Lovell Sheehan, MD;  Location: [facility] ORS;  Service: Orthopedics;  Laterality: Left;  Left Radius Wrist    IMPLANTABLE CONTACT LENS IMPLANTATION     bilateral   KNEE ARTHROSCOPY Right 05/09/2020   Procedure: ARTHROSCOPY KNEE;  Surgeon: Dereck Leep, MD;  Location: [facility] ORS;  Service: Orthopedics;  Laterality: Right;   LAMINECTOMY  11/13/2015   LEFT HEART CATH  AND CORONARY ANGIOGRAPHY Left 10/01/2017   Procedure: LEFT HEART CATH AND CORONARY ANGIOGRAPHY;  Surgeon: Nelva Bush, MD;  Location: [place] CV LAB;  Service: Cardiovascular;  Laterality: Left;   LOOP RECORDER INSERTION N/A 10/17/2017   Procedure: LOOP RECORDER INSERTION;  Surgeon: Deboraha Sprang, MD;  Location: [place] CV LAB;  Service: Cardiovascular;  Laterality: N/A;   LOOP RECORDER REMOVAL N/A 03/04/2019   Procedure: LOOP RECORDER REMOVAL;  Surgeon: Deboraha Sprang, MD;  Location: [place] [facility] LAB;  Service: Cardiovascular;  Laterality: N/A;   LUMBAR FUSION  11/2015   LUMBAR WOUND DEBRIDEMENT N/A 12/02/2015   Procedure: WOUND Exploration;  Surgeon: Consuella Lose, MD;  Location: [place];  Service: Neurosurgery;  Laterality: N/A;   OPEN REDUCTION INTERNAL FIXATION (ORIF) DISTAL RADIAL FRACTURE Left 08/29/2016   Procedure: OPEN REDUCTION INTERNAL FIXATION (ORIF) DISTAL RADIAL FRACTURE;  Surgeon: Lovell Sheehan, MD;  Location: [facility] ORS;  Service: Orthopedics;  Laterality: Left;   PACEMAKER IMPLANT N/A 03/04/2019   Procedure: PACEMAKER IMPLANT;  Surgeon: Deboraha Sprang, MD;  Location: [place] CV LAB;  Service: Cardiovascular;  Laterality: N/A;   PICC LINE PLACE PERIPHERAL ([place] HX)     right upper arm    ROTATOR CUFF REPAIR Right    SAVORY DILATION N/A 11/01/2014   Procedure: SAVORY DILATION;  Surgeon: Hulen Luster, MD;  Location: [facility] ENDOSCOPY;  Service: Gastroenterology;  Laterality: N/A;   TONSILLECTOMY     TRIGGER FINGER RELEASE Bilateral     Prior to Admission medications   Medication Sig Start Date End Date Taking? Authorizing Provider  Acetaminophen (TYLENOL ARTHRITIS PAIN PO) Take 1,300 mg by mouth every 8 (eight) hours as needed (pain).    [provider]  albuterol (PROVENTIL HFA;VENTOLIN HFA) 108 (90 Base) MCG/ACT inhaler Inhale 2 puffs into the lungs every 6 (six) hours as needed for wheezing or shortness of breath. Patient not taking: Reported on 05/04/2020 01/09/17   Alisa Graff, FNP  aspirin 81 MG tablet Take 81 mg by mouth at bedtime.     [provider]  atenolol (TENORMIN) 25 MG tablet Take 0.5 tablets (12.5 mg total) by mouth daily. Patient taking differently: Take 12.5 mg by mouth every morning. 09/11/19 12/10/19  Minna Merritts, MD  Azelaic Acid (FINACEA) 15 % FOAM Apply 1 application topically 2 (two) times daily.     [provider]  Azelaic Acid (FINACEA) 15 % FOAM Apply 1 application topically as directed. Qd to bid to aa face 06/22/20   Ralene Bathe, MD  azelastine (ASTELIN) 0.1 % nasal spray Place 2 sprays into both nostrils 2 (two) times daily as needed for rhinitis.    [provider]  Cholecalciferol (VITAMIN D) 2000 units tablet Take 2,000 Units by mouth daily.    [provider]  esomeprazole (NEXIUM) 40 MG capsule Take 40 mg by mouth 2 (two) times daily.    [provider]  gabapentin (NEURONTIN) 300 MG capsule Take 300 mg by mouth 2 (two) times daily.    [provider]  HYDROcodone-acetaminophen (NORCO) 5-325 MG tablet Take 1-2 tablets by  mouth every 4 (four) hours as needed for moderate pain. 05/09/20   Hooten, Laurice Record, MD  Insulin Aspart (NOVOLOG FLEXPEN [place]) Inject 9 Units into the skin as needed.    [provider]  insulin glargine (LANTUS) 100 UNIT/ML injection Inject 26 Units into the skin every morning.    [provider]  ipratropium-albuterol (DUONEB) 0.5-2.5 (  3) MG/3ML SOLN Take 3 mLs by nebulization every 6 (six) hours as needed. Patient not taking: No sig reported 07/27/17   Bettey Costa, MD  levothyroxine (SYNTHROID, LEVOTHROID) 75 MCG tablet Take 75 mcg by mouth daily before breakfast.  11/24/12   [provider]  linagliptin (TRADJENTA) 5 MG TABS tablet Take 5 mg by mouth daily. 03/10/20 03/10/21  [provider]  methocarbamol (ROBAXIN) 500 MG tablet Take 500 mg by mouth every 8 (eight) hours as needed for muscle spasms.    [provider]  mometasone (ELOCON) 0.1 % cream Apply 1 application topically daily as needed (Rash). Qd to bid up to 5 days a week aa right lower leg prn itching 06/22/20   Ralene Bathe, MD  nitroGLYCERIN (NITROSTAT) 0.4 MG SL tablet Place 1 tablet (0.4 mg total) under the tongue every 5 (five) minutes as needed for chest pain. 06/18/18 12/31/19  Minna Merritts, MD  ondansetron (ZOFRAN) 4 MG tablet Take 4 mg by mouth every 8 (eight) hours as needed for nausea or vomiting.  07/19/17   [provider]  potassium chloride SA (KLOR-CON) 20 MEQ tablet Take 20-40 mEq by mouth See admin instructions. Take 40 mEq by mouth every other day, alternating with 20 mEq on alternate days.    [provider]  PRALUENT 150 MG/ML SOAJ INJECT 1 PEN UNDER THE SKIN EVERY 14 DAYS Patient taking differently: Inject 150 mg into the skin every 14 (fourteen) days. 04/01/20   Minna Merritts, MD  ropinirole (REQUIP) 5 MG tablet Take 5 mg by mouth at bedtime.    [provider]  temazepam (RESTORIL) 30 MG capsule Take 30 mg by mouth at bedtime.    [provider]  torsemide (DEMADEX) 20 MG tablet Take 20-40 mg by mouth See admin instructions. Take 40 mg by mouth every other day, alternating with 20 mg on alternate days    [provider]  traMADol (ULTRAM) 50 MG tablet Take 50 mg by mouth every 6 (six) hours as needed for moderate pain or severe pain.    [provider]  trolamine salicylate (ASPERCREME) 10 % cream Apply 1 application topically at bedtime.    [provider]    Allergies Ezetimibe, Metoclopramide, Propoxyphene, Statins, Tramadol, Ceftin [cefuroxime axetil], Codeine, Penicillins, and Vicodin [hydrocodone-acetaminophen]  Family History  Problem Relation Age of Onset   Heart disease Mother    Breast cancer Mother 69   Heart disease Father    Heart attack Sister    Heart disease Sister    Heart disease Brother     Social History Social History   Tobacco Use   Smoking status: Never   Smokeless tobacco: Never  Vaping Use   Vaping Use: Never used  Substance Use Topics   Alcohol use: No   Drug use: No    Review of Systems Constitutional: No fever/chills Eyes: No visual changes. ENT: No sore throat. Cardiovascular: +chest pain associated with left arm pain Respiratory: + shortness of breath. Gastrointestinal: No abdominal pain.  + nausea, no vomiting.  No diarrhea.  No constipation. Genitourinary: Negative for dysuria. Musculoskeletal: Negative for back pain. Skin: Negative for rash. Neurological: Negative for headaches, focal weakness or numbness.  ____________________________________________   PHYSICAL EXAM:  VITAL SIGNS: ED Triage Vitals  Enc Vitals Group     BP 09/07/20 1052 (!) 174/82     Pulse Rate 09/07/20 1052 80     Resp 09/07/20 1052 18  Temp 09/07/20 1052 97.8 F (36.6 C)     Temp Source 09/07/20 1052 Oral     SpO2 09/07/20 1052 99 %     Weight 09/07/20 1053 209 lb (94.8 kg)     Height 09/07/20 1053 _0  (1.575 m)     Head Circumference --      Peak  Flow --      Pain Score 09/07/20 1053 4     Pain Loc --      Pain Edu? --      Excl. in [place]? --    Constitutional: Alert and oriented. Well appearing and in no acute distress. Eyes: Conjunctivae are normal. PERRL. EOMI. Head: Atraumatic. Nose: No congestion/rhinnorhea. Mouth/Throat: Mucous membranes are moist.  Oropharynx non-erythematous. Neck: No stridor.   Cardiovascular: Normal rate, regular rhythm. Grossly normal heart sounds.  Good peripheral circulation.  Trace bilateral edema. Respiratory: Normal respiratory effort.  No retractions. Lungs CTAB. Gastrointestinal: Soft and nontender. No distention. No abdominal bruits. No CVA tenderness. Musculoskeletal: There is mild tenderness to palpation of the right calf.  No significant edema or erythema present. Neurologic:  Normal speech and language. No gross focal neurologic deficits are appreciated. No gait instability. Skin:  Skin is warm, dry and intact. No rash noted. Psychiatric: Mood and affect are normal. Speech and behavior are normal.  ____________________________________________   LABS (all labs ordered are listed, but only abnormal results are displayed)  Labs Reviewed  BASIC METABOLIC PANEL - Abnormal; Notable for the following components:      Result Value   Chloride 97 (*)    Glucose, Bld 189 (*)    BUN 29 (*)    Creatinine, Ser 1.42 (*)    GFR, Estimated 39 (*)    All other components within normal limits  HEPATIC FUNCTION PANEL - Abnormal; Notable for the following components:   Alkaline Phosphatase 143 (*)    All other components within normal limits  LIPASE, BLOOD - Abnormal; Notable for the following components:   Lipase 54 (*)    All other components within normal limits  D-DIMER, QUANTITATIVE - Abnormal; Notable for the following components:   D-Dimer, Quant 0.62 (*)    All other components within normal limits  RESP PANEL BY RT-PCR (FLU A&B, COVID) ARPGX2  CBC  BRAIN NATRIURETIC PEPTIDE  LIPID PANEL   TROPONIN I (HIGH SENSITIVITY)  TROPONIN I (HIGH SENSITIVITY)  TROPONIN I (HIGH SENSITIVITY)   ____________________________________________  EKG  Paced atrial rhythm with a rate of 80 bpm.  No ST segment changes.  Normal QTC. ____________________________________________  RADIOLOGY I, Marlana Salvage, personally viewed and evaluated these images (plain radiographs) as part of my medical decision making, as well as reviewing the written report by the radiologist.  ED provider interpretation: No acute changes noted on chest x-ray, see radiology report for additional findings  Official radiology report(s): DG Chest 2 View  Result Date: 09/07/2020 CLINICAL DATA:  Chest pain EXAM: CHEST - 2 VIEW COMPARISON:  03/04/2019 FINDINGS: Left chest wall pacer device is noted with leads in the right atrial appendage and right ventricle. Heart size is normal. No pleural effusion or edema. No airspace opacities identified. Chronic interstitial coarsening appears similar to the previous chest radiograph. IMPRESSION: No acute cardiopulmonary abnormalities. Electronically Signed   By: Kerby Moors M.D.   On: 09/07/2020 12:06   CT Angio Chest PE W and/or Wo Contrast  Result Date: 09/07/2020 CLINICAL DATA:  Chest pain radiating to the left upper extremity. PE suspected,  high prob. EXAM: CT ANGIOGRAPHY CHEST WITH CONTRAST TECHNIQUE: Multidetector CT imaging of the chest was performed using the standard protocol during bolus administration of intravenous contrast. Multiplanar CT image reconstructions and MIPs were obtained to evaluate the vascular anatomy. CONTRAST:  43m OMNIPAQUE IOHEXOL 350 MG/ML SOLN COMPARISON:  Chest radiograph from earlier today. 05/16/2018 chest CT angiogram. FINDINGS: Cardiovascular: The study is high quality for the evaluation of pulmonary embolism. There are no filling defects in the central, lobar, segmental or subsegmental pulmonary artery branches to suggest acute pulmonary  embolism. Atherosclerotic nonaneurysmal thoracic aorta. Normal caliber pulmonary arteries. Top-normal heart size. No significant pericardial fluid/thickening. Left anterior descending and right coronary atherosclerosis. Two lead left subclavian pacemaker with lead tips in the right atrium and right ventricular apex. Mediastinum/Nodes: Hypodense left thyroid nodules, largest 2.4 cm, stable. Unremarkable esophagus. No pathologically enlarged axillary, mediastinal or hilar lymph nodes. Lungs/Pleura: No pneumothorax. No pleural effusion. No acute consolidative airspace disease, lung masses or significant pulmonary nodules. Subpleural calcified subcentimeter posterior left lower lobe granuloma is stable. Upper abdomen: Small hiatal hernia.  Cholecystectomy. Musculoskeletal: No aggressive appearing focal osseous lesions. Marked thoracic spondylosis. Review of the MIP images confirms the above findings. IMPRESSION: 1. No pulmonary embolism. No active pulmonary disease. 2. Two-vessel coronary atherosclerosis. 3. Dominant 2.4 cm hypodense left thyroid nodule. Recommend thyroid [place](ref: J Am Coll Radiol. 2015 Feb;12(2): 143-50). 4. Small hiatal hernia. 5. Aortic Atherosclerosis (ICD10-I70.0). Electronically Signed   By: JIlona SorrelM.D.   On: 09/07/2020 17:50   [place]ous Img Lower Unilateral Right  Result Date: 09/07/2020 CLINICAL DATA:  Calf pain, edema, recent surgery EXAM: RIGHT LOWER EXTREMITY VENOUS DOPPLER ULTRASOUND TECHNIQUE: Gray-scale sonography with compression, as well as color and duplex ultrasound, were performed to evaluate the deep venous system(s) from the level of the common femoral vein through the popliteal and proximal calf veins. COMPARISON:  06/24/2016 FINDINGS: VENOUS Normal compressibility of the common femoral, superficial femoral, and popliteal veins, as well as the visualized calf veins. Visualized portions of profunda femoral vein and great saphenous vein unremarkable. No filling defects to  suggest DVT on grayscale or color Doppler imaging. Doppler waveforms show normal direction of venous flow, normal respiratory phasicity and response to augmentation. Limited views of the contralateral common femoral vein are unremarkable. OTHER None. Limitations: none IMPRESSION: No femoropopliteal DVT nor evidence of DVT within the visualized calf veins. If clinical symptoms are inconsistent or if there are persistent or worsening symptoms, further imaging (possibly involving the iliac veins) may be warranted. Electronically Signed   By: DLucrezia EuropeM.D.   On: 09/07/2020 14:26    ____________________________________________   INITIAL IMPRESSION / ASSESSMENT AND PLAN / ED COURSE  As part of my medical decision making, I reviewed the following data within the [place]notes reviewed and incorporated, Labs reviewed, Radiograph reviewed, Discussed with admitting physician Dr. MTrilby Drummer Evaluated by EM attending Dr. RQuentin Cornwall and Notes from prior ED visits        Patient is a 75year old female who reports to the emergency department with left arm pain developing into chest pain, shortness of breath and nausea that began yesterday morning and worsened throughout the last 36 hours.  Her symptoms seem to be intermittently relieved by nitro.  She also had recent surgery with right calf pain associated with her symptoms.  See HPI for further details.  In triage patient's mildly hypertensive with a rate of 174/82, otherwise vitals within normal limits.  Physical exam with no  significant abnormalities.  Laboratory evaluation began with CBC, BMP, hepatic function panel, troponin, lipase.  CBC within normal limits.  BMP with elevated glucose of 189, elevated creatinine of 1.42 with GFR of 39, which is roughly similar to previous known CKD.  Hepatic function panel with mild elevation of alk phos, otherwise within normal limits.  Lipase of 54, very mildly elevated.  BNP within normal limits  at 54.6.  Initial troponin and repeat troponin 2.  Respiratory panel negative for COVID or flu.  Initial EKG without any significant ST-T wave changes.  Chest x-ray without any pneumonia or other acute process.  Ultrasound to rule out DVT was obtained of the right lower extremity given her recent surgery and pain in her calf, however this was negative.  A D-dimer was obtained given her recent surgery with shortness of breath and was elevated.  At that time, a CT scan was obtained with contrast to rule out PE, and was negative but did show multiple vessel CAD, which the patient was aware she already had.  At this time, initial cardiac work-up is grossly reassuring.  However, the patient does have a concerning history given left arm pain progressing to chest pain and then to nausea with improvement with nitro.  She also has an elevated heart score of 6, last catheterization was several years ago.  Shared decision making was had with the patient, and she wishes to stay in the hospital for observation, which I feel is reasonable given her risk factors.  The patient was also seen and examined by attending Dr. Quentin Cornwall who agreed with the above work-up.  The case was discussed with attending hospitalist Dr. Trilby Drummer, who agrees to admit the patient.      ____________________________________________   FINAL CLINICAL IMPRESSION(S) / ED DIAGNOSES  Final diagnoses:  Unstable angina [facility])     ED Discharge Orders     None        Note:  This document was prepared using Dragon voice recognition software and may include unintentional dictation errors.    Marlana Salvage, PA 09/07/20 Felicita Gage    Merlyn Lot, MD 09/16/20 [phone]

## 2020-09-07 NOTE — ED Triage Notes (Signed)
Pt comes ems from home with chest pain radiating into her left arm and HTN 178/80. Started yesterday. Took 5 nitro with no relief yesterday and 1 nitro today. Had '324mg'$  aspirin at home. States nothing has relieved the pain. EKG WNL with EMS.

## 2020-09-07 NOTE — ED Notes (Signed)
Blue, green, purple top sent

## 2020-09-07 NOTE — H&P (Addendum)
History and Physical   DIAVIAN VANDERJAGT [id number] DOB: 17-Dec-1945 DOA: 09/07/2020  PCP: Rusty Aus, MD   Patient coming from: Home  Chief Complaint: Chest pain  HPI: [name] is a 75 y.o. female with medical history significant of osteoporosis, anxiety, CAD, tachycardia, status post pacemaker, diastolic heart failure, low back pain, CKD 3b, diabetes, GERD, hypertension, hyperlipidemia, hypothyroidism, OSA who presents with ongoing chest pain. Patient first began to have pain in her left arm yesterday without chest pain initially.  And then she had chest pain on recurrent episodes initially not relieved with nitro but did get some relief with additional doses.  She also does take an aspirin at home but nothing completely relieves the pain.  She states she is now started to have some nausea and some shortness of breath worse from her baseline as well as some right calf pain in the setting of recent surgery.  EMS also reported blood pressure was in the [id number] systolic.  Pain described as a 7-8 out of 10 at worst dull and achy pain worse with exertion and relieved with nitro as above.  Lasting for hours at a time.  Of note, she was taking ranolazine for some angina but stopped taking this 2 months ago. Patient denies fevers, chills, abdominal pain, constipation, diarrhea.  ED Course: Vital signs in the ED significant for initial blood pressure in the 170s now improved to the [id number] systolic.  Lab work-up showed CMP with chloride 97, BUN 29, creatinine stable 1.42 from baseline around 1.3, glucose 189, mild elevation in ALP at 143.  CBC within normal limits.  Troponin normal x2.  BNP normal.  Lipase mildly elevated at 54.  Respiratory panel for flu and COVID-negative.  D-dimer mildly elevated at 0.62 however this may be normal if age-adjusted.  Imaging work-up included chest x-ray which was within normal limits, right lower extremity DVT study which was negative and CTA of the chest which was  negative for PE but did demonstrate known CAD a thyroid nodule and a small hiatal hernia.  Patient received morphine and Zofran in the ED.  Review of Systems: As per HPI otherwise all other systems reviewed and are negative.  Past Medical History:  Diagnosis Date   Acquired elevated diaphragm    HIGHER ON RIGHT SIDE   Anemia    Anxiety    Aortic atherosclerosis (HCC)    Arthritis    CHF (congestive heart failure) (HCC)    Chronic back pain    stenosis.degenerative disc,some scoliosis   Chronic kidney disease    stage 3   Complication of anesthesia 2018   aspirated during wrist surgery during LMA removal   Constipation    takes Stool Softener daily   Coronary artery disease    Depression    takes Cymbalta daily   Diabetes mellitus    Type 2 diabetic. Average fasting blood sugar runs high [id number]   Diastolic CHF ([place]) [id number]   Per patient, diagnosed in 2018   Diverticulosis    Dysplastic nevus 09/12/2017   L sup buttock - mild    Dysplastic nevus 09/18/2018   R sup med buttocks - mild    E coli infection    GERD (gastroesophageal reflux disease)    takes Nexium daily   Grade II diastolic dysfunction    Headache    Hemorrhoids    History of colon polyps    benign   History of gout    doesn't take any meds  History of hiatal hernia    small   History of vertigo    doesn't take any meds   Hyperlipidemia    takes Praluent daily   Hypertension    currently BP medications are on hold    Hypothyroidism    takes Synthroid daily   Insomnia    takes Restoril nightly   Muscle spasm    takes Robaxin as needed   NSVT (nonsustained ventricular tachycardia) (HCC)    OSA on CPAP    Peripheral vascular disease ([place])    AAA as stated per pt / was just discovered and pt states has not been referred to vascular MD    Presence of permanent cardiac pacemaker    Restless leg    takes Requip daily   Rosacea    Syncope    Varicose veins     Past Surgical History:   Procedure Laterality Date   ABDOMINAL HYSTERECTOMY     with BSo   CARDIAC CATHETERIZATION  2013   Normal   CARPAL TUNNEL RELEASE Bilateral    CATARACT EXTRACTION, BILATERAL     CHOLECYSTECTOMY     COLONOSCOPY     COLONOSCOPY WITH PROPOFOL N/A 11/25/2018   Procedure: COLONOSCOPY WITH PROPOFOL;  Surgeon: Toledo, Benay Pike, MD;  Location: [facility] ENDOSCOPY;  Service: Gastroenterology;  Laterality: N/A;   DIAGNOSTIC LAPAROSCOPY     multiple times   DILATION AND CURETTAGE OF UTERUS     ESOPHAGOGASTRODUODENOSCOPY (EGD) WITH PROPOFOL N/A 11/01/2014   Procedure: ESOPHAGOGASTRODUODENOSCOPY (EGD) WITH PROPOFOL;  Surgeon: Hulen Luster, MD;  Location: [facility] ENDOSCOPY;  Service: Gastroenterology;  Laterality: N/A;   ESOPHAGOGASTRODUODENOSCOPY (EGD) WITH PROPOFOL N/A 11/25/2018   Procedure: ESOPHAGOGASTRODUODENOSCOPY (EGD) WITH PROPOFOL;  Surgeon: Toledo, Benay Pike, MD;  Location: [facility] ENDOSCOPY;  Service: Gastroenterology;  Laterality: N/A;   HARDWARE REMOVAL Left 10/11/2016   Procedure: HARDWARE REMOVAL-LEFT RADIUS;  Surgeon: Lovell Sheehan, MD;  Location: [facility] ORS;  Service: Orthopedics;  Laterality: Left;  Left Radius Wrist    IMPLANTABLE CONTACT LENS IMPLANTATION     bilateral   KNEE ARTHROSCOPY Right 05/09/2020   Procedure: ARTHROSCOPY KNEE;  Surgeon: Dereck Leep, MD;  Location: [facility] ORS;  Service: Orthopedics;  Laterality: Right;   LAMINECTOMY  11/13/2015   LEFT HEART CATH AND CORONARY ANGIOGRAPHY Left 10/01/2017   Procedure: LEFT HEART CATH AND CORONARY ANGIOGRAPHY;  Surgeon: Nelva Bush, MD;  Location: [place] CV LAB;  Service: Cardiovascular;  Laterality: Left;   LOOP RECORDER INSERTION N/A 10/17/2017   Procedure: LOOP RECORDER INSERTION;  Surgeon: Deboraha Sprang, MD;  Location: [place] CV LAB;  Service: Cardiovascular;  Laterality: N/A;   LOOP RECORDER REMOVAL N/A 03/04/2019   Procedure: LOOP RECORDER REMOVAL;  Surgeon: Deboraha Sprang, MD;  Location: [place] CV LAB;   Service: Cardiovascular;  Laterality: N/A;   LUMBAR FUSION  11/2015   LUMBAR WOUND DEBRIDEMENT N/A 12/02/2015   Procedure: WOUND Exploration;  Surgeon: Consuella Lose, MD;  Location: [place];  Service: Neurosurgery;  Laterality: N/A;   OPEN REDUCTION INTERNAL FIXATION (ORIF) DISTAL RADIAL FRACTURE Left 08/29/2016   Procedure: OPEN REDUCTION INTERNAL FIXATION (ORIF) DISTAL RADIAL FRACTURE;  Surgeon: Lovell Sheehan, MD;  Location: [facility] ORS;  Service: Orthopedics;  Laterality: Left;   PACEMAKER IMPLANT N/A 03/04/2019   Procedure: PACEMAKER IMPLANT;  Surgeon: Deboraha Sprang, MD;  Location: [place] CV LAB;  Service: Cardiovascular;  Laterality: N/A;   PICC LINE PLACE PERIPHERAL ([place] HX)     right upper arm  ROTATOR CUFF REPAIR Right    SAVORY DILATION N/A 11/01/2014   Procedure: SAVORY DILATION;  Surgeon: Hulen Luster, MD;  Location[facility]COPY;  Service: Gastroenterology;  Laterality: N/A;   TONSILLECTOMY     TRIGGER FINGER RELEASE Bilateral     Social History  reports that she has never smoked. She has never used smokeless tobacco. She reports that she does not drink alcohol and does not use drugs.  Allergies  Allergen Reactions   Ezetimibe Diarrhea and Nausea Only   Metoclopramide Diarrhea   Propoxyphene Other (See Comments)    Unsure of reaction type   Statins Other (See Comments)    Leg pain   Tramadol Hives   Ceftin [Cefuroxime Axetil] Diarrhea   Codeine Rash        Penicillins Rash and Other (See Comments)    Has patient had a PCN reaction causing immediate rash, facial/tongue/throat swelling, SOB or lightheadedness with hypotension: NO Has patient had a PCN reaction causing severe rash involving mucus membranes or skin necrosis: NO Has patient had a PCN retioion that required hospitalization NO Has patient had a PCN reaction occurring within the last 10 years: NO If all of the above answers are "NO", then may proceed with Cephalosporin use.   Vicodin  [Hydrocodone-Acetaminophen] Other (See Comments)    passes out    Family History  Problem Relation Age of Onset   Heart disease Mother    Breast cancer Mother 73   Heart disease Father    Heart attack Sister    Heart disease Sister    Heart disease Brother   Reviewed on admission  Prior to Admission medications   Medication Sig Start Date End Date Taking? Authorizing Provider  Acetaminophen (TYLENOL ARTHRITIS PAIN PO) Take 1,300 mg by mouth every 8 (eight) hours as needed (pain).    [provider]  albuterol (PROVENTIL HFA;VENTOLIN HFA) 108 (90 Base) MCG/ACT inhaler Inhale 2 puffs into the lungs every 6 (six) hours as needed for wheezing or shortness of breath. Patient not taking: Reported on 05/04/2020 01/09/17   Alisa Graff, FNP  aspirin 81 MG tablet Take 81 mg by mouth at bedtime.     [provider]  atenolol (TENORMIN) 25 MG tablet Take 0.5 tablets (12.5 mg total) by mouth daily. Patient taking differently: Take 12.5 mg by mouth every morning. 09/11/19 12/10/19  Minna Merritts, MD  Azelaic Acid (FINACEA) 15 % FOAM Apply 1 application topically 2 (two) times daily.     [provider]  Azelaic Acid (FINACEA) 15 % FOAM Apply 1 application topically as directed. Qd to bid to aa face 06/22/20   Ralene Bathe, MD  azelastine (ASTELIN) 0.1 % nasal spray Place 2 sprays into both nostrils 2 (two) times daily as needed for rhinitis.    [provider]  Cholecalciferol (VITAMIN D) 2000 units tablet Take 2,000 Units by mouth daily.    [provider]  esomeprazole (NEXIUM) 40 MG capsule Take 40 mg by mouth 2 (two) times daily.    [provider]  gabapentin (NEURONTIN) 300 MG capsule Take 300 mg by mouth 2 (two) times daily.    [provider]  HYDROcodone-acetaminophen (NORCO) 5-325 MG tablet Take 1-2 tablets by mouth every 4 (four) hours as needed for moderate pain. 05/09/20   Hooten, Laurice Record, MD  Insulin Aspart (NOVOLOG  FLEXPEN [place]) Inject 9 Units into the skin as needed.    [provider]  insulin glargine (LANTUS) 100  UNIT/ML injection Inject 26 Units into the skin every morning.    [provider]  ipratropium-albuterol (DUONEB) 0.5-2.5 (3) MG/3ML SOLN Take 3 mLs by nebulization every 6 (six) hours as needed. Patient not taking: No sig reported 07/27/17   Bettey Costa, MD  levothyroxine (SYNTHROID, LEVOTHROID) 75 MCG tablet Take 75 mcg by mouth daily before breakfast.  11/24/12   [provider]  linagliptin (TRADJENTA) 5 MG TABS tablet Take 5 mg by mouth daily. 03/10/20 03/10/21  [provider]  methocarbamol (ROBAXIN) 500 MG tablet Take 500 mg by mouth every 8 (eight) hours as needed for muscle spasms.    [provider]  mometasone (ELOCON) 0.1 % cream Apply 1 application topically daily as needed (Rash). Qd to bid up to 5 days a week aa right lower leg prn itching 06/22/20   Ralene Bathe, MD  nitroGLYCERIN (NITROSTAT) 0.4 MG SL tablet Place 1 tablet (0.4 mg total) under the tongue every 5 (five) minutes as needed for chest pain. 06/18/18 12/31/19  Minna Merritts, MD  ondansetron (ZOFRAN) 4 MG tablet Take 4 mg by mouth every 8 (eight) hours as needed for nausea or vomiting.  07/19/17   [provider]  potassium chloride SA (KLOR-CON) 20 MEQ tablet Take 20-40 mEq by mouth See admin instructions. Take 40 mEq by mouth every other day, alternating with 20 mEq on alternate days.    [provider]  PRALUENT 150 MG/ML SOAJ INJECT 1 PEN UNDER THE SKIN EVERY 14 DAYS Patient taking differently: Inject 150 mg into the skin every 14 (fourteen) days. 04/01/20   Minna Merritts, MD  ropinirole (REQUIP) 5 MG tablet Take 5 mg by mouth at bedtime.    [provider]  temazepam (RESTORIL) 30 MG capsule Take 30 mg by mouth at bedtime.    [provider]  torsemide (DEMADEX) 20 MG tablet Take 20-40 mg by mouth See admin instructions. Take 40 mg by  mouth every other day, alternating with 20 mg on alternate days    [provider]  traMADol (ULTRAM) 50 MG tablet Take 50 mg by mouth every 6 (six) hours as needed for moderate pain or severe pain.    [provider]  trolamine salicylate (ASPERCREME) 10 % cream Apply 1 application topically at bedtime.    [provider]    Physical Exam: Vitals:   09/07/20 1500 09/07/20 1600 09/07/20 1700 09/07/20 1830  BP: (!) 116/49 131/76 (!) 132/58 124/63  Pulse:  79  71  Resp: '18 17 17 14  '$ Temp:      TempSrc:      SpO2:  96%  98%  Weight:      Height:       Physical Exam Constitutional:      General: She is not in acute distress.    Appearance: Normal appearance. She is obese.  HENT:     Head: Normocephalic and atraumatic.     Mouth/Throat:     Mouth: Mucous membranes are moist.     Pharynx: Oropharynx is clear.  Eyes:     Extraocular Movements: Extraocular movements intact.     Pupils: Pupils are equal, round, and reactive to light.  Cardiovascular:     Rate and Rhythm: Normal rate and regular rhythm.     Pulses: Normal pulses.     Heart sounds: Normal heart sounds.  Pulmonary:     Effort: Pulmonary effort is normal. No respiratory distress.     Breath sounds:  Normal breath sounds.  Abdominal:     General: Bowel sounds are normal. There is no distension.     Palpations: Abdomen is soft.     Tenderness: There is no abdominal tenderness.  Musculoskeletal:        General: No swelling or deformity.  Skin:    General: Skin is warm and dry.  Neurological:     General: No focal deficit present.     Mental Status: Mental status is at baseline.   Labs on Admission: I have personally reviewed following labs and imaging studies  CBC: Recent Labs  Lab 09/07/20 1126  WBC 6.6  HGB 12.3  HCT 38.1  MCV 84.1  PLT [id number]    Basic Metabolic Panel: Recent Labs  Lab 09/07/20 1126  NA 137  K 4.2  CL 97*  CO2 27  GLUCOSE 189*  BUN 29*  CREATININE 1.42*   CALCIUM 9.5    GFR: Estimated Creatinine Clearance: 36.7 mL/min (A) (by C-G formula based on SCr of 1.42 mg/dL (H)).  Liver Function Tests: Recent Labs  Lab 09/07/20 1126  AST 16  ALT 17  ALKPHOS 143*  BILITOT 0.6  PROT 7.9  ALBUMIN 4.1    Urine analysis:    Component Value Date/Time   COLORURINE YELLOW (A) 02/22/2018 0302   APPEARANCEUR CLEAR (A) 02/22/2018 0302   LABSPEC 1.022 02/22/2018 0302   PHURINE 6.0 02/22/2018 0302   GLUCOSEU >=500 (A) 02/22/2018 0302   HGBUR NEGATIVE 02/22/2018 0302   BILIRUBINUR NEGATIVE 02/22/2018 0302   KETONESUR NEGATIVE 02/22/2018 0302   PROTEINUR NEGATIVE 02/22/2018 0302   NITRITE NEGATIVE 02/22/2018 0302   LEUKOCYTESUR NEGATIVE 02/22/2018 0302    Radiological Exams on Admission: DG Chest 2 View  Result Date: 09/07/2020 CLINICAL DATA:  Chest pain EXAM: CHEST - 2 VIEW COMPARISON:  03/04/2019 FINDINGS: Left chest wall pacer device is noted with leads in the right atrial appendage and right ventricle. Heart size is normal. No pleural effusion or edema. No airspace opacities identified. Chronic interstitial coarsening appears similar to the previous chest radiograph. IMPRESSION: No acute cardiopulmonary abnormalities. Electronically Signed   By: Kerby Moors M.D.   On: 09/07/2020 12:06   CT Angio Chest PE W and/or Wo Contrast  Result Date: 09/07/2020 CLINICAL DATA:  Chest pain radiating to the left upper extremity. PE suspected, high prob. EXAM: CT ANGIOGRAPHY CHEST WITH CONTRAST TECHNIQUE: Multidetector CT imaging of the chest was performed using the standard protocol during bolus administration of intravenous contrast. Multiplanar CT image reconstructions and MIPs were obtained to evaluate the vascular anatomy. CONTRAST:  68m OMNIPAQUE IOHEXOL 350 MG/ML SOLN COMPARISON:  Chest radiograph from earlier today. 05/16/2018 chest CT angiogram. FINDINGS: Cardiovascular: The study is high quality for the evaluation of pulmonary embolism. There are  no filling defects in the central, lobar, segmental or subsegmental pulmonary artery branches to suggest acute pulmonary embolism. Atherosclerotic nonaneurysmal thoracic aorta. Normal caliber pulmonary arteries. Top-normal heart size. No significant pericardial fluid/thickening. Left anterior descending and right coronary atherosclerosis. Two lead left subclavian pacemaker with lead tips in the right atrium and right ventricular apex. Mediastinum/Nodes: Hypodense left thyroid nodules, largest 2.4 cm, stable. Unremarkable esophagus. No pathologically enlarged axillary, mediastinal or hilar lymph nodes. Lungs/Pleura: No pneumothorax. No pleural effusion. No acute consolidative airspace disease, lung masses or significant pulmonary nodules. Subpleural calcified subcentimeter posterior left lower lobe granuloma is stable. Upper abdomen: Small hiatal hernia.  Cholecystectomy. Musculoskeletal: No aggressive appearing focal osseous lesions. Marked thoracic spondylosis. Review of the MIP images  confirms the above findings. IMPRESSION: 1. No pulmonary embolism. No active pulmonary disease. 2. Two-vessel coronary atherosclerosis. 3. Dominant 2.4 cm hypodense left thyroid nodule. Recommend thyroid US (ref: J Am Coll Radiol. 2015 Feb;12(2): 143-50). 4. Small hiatal hernia. 5. Aortic Atherosclerosis (ICD10-I70.0). Electronically Signed   By: Ilona Sorrel M.D.   On: 09/07/2020 17:50   US Venous Img Lower Unilateral Right  Result Date: 09/07/2020 CLINICAL DATA:  Calf pain, edema, recent surgery EXAM: RIGHT LOWER EXTREMITY VENOUS DOPPLER ULTRASOUND TECHNIQUE: Gray-scale sonography with compression, as well as color and duplex ultrasound, were performed to evaluate the deep venous system(s) from the level of the common femoral vein through the popliteal and proximal calf veins. COMPARISON:  06/24/2016 FINDINGS: VENOUS Normal compressibility of the common femoral, superficial femoral, and popliteal veins, as well as the  visualized calf veins. Visualized portions of profunda femoral vein and great saphenous vein unremarkable. No filling defects to suggest DVT on grayscale or color Doppler imaging. Doppler waveforms show normal direction of venous flow, normal respiratory phasicity and response to augmentation. Limited views of the contralateral common femoral vein are unremarkable. OTHER None. Limitations: none IMPRESSION: No femoropopliteal DVT nor evidence of DVT within the visualized calf veins. If clinical symptoms are inconsistent or if there are persistent or worsening symptoms, further imaging (possibly involving the iliac veins) may be warranted. Electronically Signed   By: Lucrezia Europe M.D.   On: 09/07/2020 14:26    EKG: Independently reviewed.  Atrial paced rhythm at 80 bpm.  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 > Troponin has remained stable as well as EKG in the ED thus far. > Last cath in 2019 with moderate disease that is being medically managed > Stopped for nausea and 2 months ago. - Message sent to cardiology for consult in the morning, Dr Rayann Heman. - Monitor on telemetry - Continue trend troponin - Check echocardiogram - Lipid panel in the a.m. - Continue home daily aspirin - Continue home as needed nitro  Thyroid nodule > Noted on CT scan recommend ultrasound follow-up - Thyroid ultrasound   Diastolic  heart failure > Last echo in system was 2019 with EF 60-65%, left and right heart cath also done in 2019 demonstrated evidence of diastolic dysfunction. - Repeating echocardiogram as above - Continue home torsemide  Hypothyroidism - Continue home Synthroid  Hypertension > BP initially elevated in the [id number] systolic in the ED however this improved to the 130s without intervention - Continue home torsemide as above  Hyperlipidemia - On Praluent - Checking lipid panel as above  Diabetes > On 28U qhs and SSI at home - Lantus 20 units nightly  - SSI   Chronic pain (low back) - Continue home tramadol, Robaxin, gabapentin  Status post pacemaker - Secondary to sinus pauses leading to arrhythmogenic syncope  CKD 3B > Creatinine stable at 1.42 from baseline of around 1.3 -  Avoid nephrotoxic agents - Trend renal function and electrolytes  OSA - Continue home CPAP  DVT prophylaxis: Heparin  Code Status:   Full  Family Communication:  Husband updated at bedside Disposition Plan:   Patient is from:  Home  Anticipated DC to:  Home  Anticipated DC date:  1 to 2 days  Anticipated DC barriers: None  Consults called:  Message sent to cardiology for consult in the morning.   Admission status:  Observation, telemetry   Severity of Illness: The appropriate patient status for this patient is OBSERVATION. Observation status is judged to be reasonable and necessary in order to provide the required intensity of service to ensure the patient's safety. The patient's presenting symptoms, physical exam findings, and initial radiographic and laboratory data in the context of their medical condition is felt to place them at decreased risk for further clinical deterioration. Furthermore, it is anticipated that the patient will be medically stable for discharge from the hospital within 2 midnights of admission. The following factors support the patient status of observation.   " The patient's presenting  symptoms include chest pain, left arm pain. " The physical exam findings include stable physical exam. " The initial radiographic and laboratory data are Lab work-up showed CMP with chloride 97, BUN 29, creatinine stable 1.42 from baseline around 1.3, glucose 189, mild elevation in ALP at 143.  CBC within normal limits.  Troponin normal x2.  BNP normal.  Lipase mildly elevated at 54.  Respiratory panel for flu and COVID-negative.  D-dimer mildly elevated at 0.62 however this may be normal if age-adjusted.  Imaging work-up included chest x-ray which was within normal limits, right lower extremity DVT study which was negative and CTA of the chest which was negative for PE but did demonstrate known CAD a thyroid nodule and a small hiatal hernia.   Marcelyn Bruins MD [facility]  How to contact the [facility] Attending or Consulting provider [place] or covering provider during after hours [place], for this patient?   Check the care team in [facility] and look for a) attending/consulting [facility] provider listed and b) the [facility] team listed Log into [email or web address] and use [place]'s universal password to access. If you do not have the password, please contact the hospital operator. Locate the [facility] provider you are looking for under [facility] and page to a number that you can be directly reached. If you still have difficulty reaching the provider, please page the [facility] (Director on Call) for the Hospitalists listed on amion for assistance.  09/07/2020, 9:01 PM

## 2020-09-08 ENCOUNTER — Observation Stay (HOSPITAL_BASED_OUTPATIENT_CLINIC_OR_DEPARTMENT_OTHER)
Admit: 2020-09-08 | Discharge: 2020-09-08 | Disposition: A | Discharge status: Home / Self Care | Payer: Medicare Other | Attending: Cardiovascular Disease | Admitting: Cardiovascular Disease

## 2020-09-08 ENCOUNTER — Encounter: Payer: Self-pay | Admitting: Internal Medicine

## 2020-09-08 ENCOUNTER — Observation Stay (HOSPITAL_BASED_OUTPATIENT_CLINIC_OR_DEPARTMENT_OTHER): Payer: Medicare Other

## 2020-09-08 DIAGNOSIS — E782 Mixed hyperlipidemia: Secondary | ICD-10-CM

## 2020-09-08 DIAGNOSIS — I25118 Atherosclerotic heart disease of native coronary artery with other forms of angina pectoris: Secondary | ICD-10-CM

## 2020-09-08 DIAGNOSIS — Z9989 Dependence on other enabling machines and devices: Secondary | ICD-10-CM

## 2020-09-08 DIAGNOSIS — R079 Chest pain, unspecified: Secondary | ICD-10-CM

## 2020-09-08 DIAGNOSIS — G4733 Obstructive sleep apnea (adult) (pediatric): Secondary | ICD-10-CM

## 2020-09-08 DIAGNOSIS — K219 Gastro-esophageal reflux disease without esophagitis: Secondary | ICD-10-CM | POA: Diagnosis not present

## 2020-09-08 DIAGNOSIS — I25110 Atherosclerotic heart disease of native coronary artery with unstable angina pectoris: Secondary | ICD-10-CM | POA: Diagnosis not present

## 2020-09-08 LAB — NM MYOCAR MULTI W/SPECT W/WALL MOTION / EF
Estimated workload: 1 METS
Exercise duration (min): 0 min
Exercise duration (sec): 0 s
LV dias vol: 34 mL (ref 46–106)
LV sys vol: 6 mL
MPHR: 145 {beats}/min
Peak HR: 110 {beats}/min
Percent HR: 75 %
Rest HR: 77 {beats}/min
SDS: 0
SRS: 0
SSS: 0
TID: 0.92

## 2020-09-08 LAB — GLUCOSE, CAPILLARY: Glucose-Capillary: 118 mg/dL — ABNORMAL HIGH (ref 70–99)

## 2020-09-08 LAB — LIPID PANEL
Cholesterol: 163 mg/dL (ref 0–200)
HDL: 44 mg/dL (ref >40)
LDL Cholesterol: 81 mg/dL (ref 0–99)
Total CHOL/HDL Ratio: 3.7 RATIO
Triglycerides: 192 mg/dL — ABNORMAL HIGH (ref <150)
VLDL: 38 mg/dL (ref 0–40)

## 2020-09-08 LAB — ECHOCARDIOGRAM COMPLETE
AR max vel: 2.45 cm2
AV Area VTI: 2.17 cm2
AV Area mean vel: 2.25 cm2
AV Mean grad: 5 mmHg
AV Peak grad: 8 mmHg
Ao pk vel: 1.41 m/s
Area-P 1/2: 4.8 cm2
Height: 62 in
MV VTI: 2.22 cm2
S' Lateral: 2.8 cm
Weight: 3344 oz

## 2020-09-08 LAB — TROPONIN I (HIGH SENSITIVITY): Troponin I (High Sensitivity): <2 ng/L (ref <18)

## 2020-09-08 LAB — CBG MONITORING, ED: Glucose-Capillary: 106 mg/dL — ABNORMAL HIGH (ref 70–99)

## 2020-09-08 MED ORDER — PERFLUTREN LIPID MICROSPHERE
1.0000 mL | INTRAVENOUS | Status: AC | PRN
  Administered 2020-09-08: 3 mL via INTRAVENOUS
  Filled 2020-09-08: qty 10

## 2020-09-08 MED ORDER — TECHNETIUM TC 99M TETROFOSMIN IV KIT
32.4700 | PACK | Freq: Once | INTRAVENOUS | Status: AC | PRN
  Administered 2020-09-08: 32.47 via INTRAVENOUS
  Filled 2020-09-08: qty 33

## 2020-09-08 MED ORDER — RANOLAZINE ER 500 MG PO TB12
500.0000 mg | ORAL_TABLET | Freq: Two times a day (BID) | ORAL | Status: DC
  Administered 2020-09-08: 500 mg via ORAL
  Filled 2020-09-08 (×3): qty 1

## 2020-09-08 MED ORDER — METOPROLOL TARTRATE 25 MG PO TABS: 12.5000 mg | ORAL_TABLET | Freq: Two times a day (BID) | ORAL | Status: DC

## 2020-09-08 MED ORDER — TECHNETIUM TC 99M TETROFOSMIN IV KIT
9.7700 | PACK | Freq: Once | INTRAVENOUS | Status: AC | PRN
  Administered 2020-09-08: 9.77 via INTRAVENOUS
  Filled 2020-09-08: qty 10

## 2020-09-08 MED ORDER — REGADENOSON 0.4 MG/5ML IV SOLN
0.4000 mg | Freq: Once | INTRAVENOUS | Status: AC
  Administered 2020-09-08: 0.4 mg via INTRAVENOUS

## 2020-09-08 NOTE — Discharge Summary (Signed)
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  Cardiology is consulted on admission who recommended nuclear stress testing.  Patient undergo stress testing today 7/28.  Results of stress test are negative patient may build to discharge home this afternoon.  Case discussed with cardiology.  Stress test and echocardiogram normal.  Results reassuring.  Patient stable from cardiac standpoint.  Given flat troponins and negative stress test patient is can discharge home.  Etiology of patient's symptoms is unclear.  Suspect GI versus musculoskeletal versus possibly increased with life stressors.  Patient does endorse some increased life stressors as of late.  No changes made in home medication regimen.  Patient recommended to see her PCP within 1 to 2 weeks of discharge and if needed move up her appointment with cardiology.   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  Chest pain x1 day with involvement of left shoulder and into the jaw.  Cardiac markers have been flat.  No ischemic changes on EKG.  Echocardiogram and nuclear stress test within normal limits.  Patient discharged home without changes made in home medication regimen.  Thyroid nodule Noted on CT scan.  Recommend ultrasound follow-up.  This is something that can be deferred to outpatient.  Patient is aware and will follow up with her primary care physician for further evaluation and management.  Discharge Instructions  Discharge Instructions     Diet - low sodium heart healthy   Complete by: As directed    Increase activity slowly   Complete by: As directed       Allergies as of 09/08/2020       Reactions   Ezetimibe Diarrhea, Nausea Only   Metoclopramide Diarrhea   Propoxyphene Other (See Comments)   Unsure of reaction type   Statins Other (See Comments)   Leg pain   Tramadol Hives   Ceftin [cefuroxime Axetil] Diarrhea   Codeine Rash       Penicillins Rash, Other (See Comments)   Has patient had a PCN reaction causing immediate rash, facial/tongue/throat swelling, SOB or lightheadedness with hypotension: NO Has patient had a PCN reaction causing severe rash involving mucus membranes or skin necrosis: NO Has patient had a PCN retioion that required hospitalization NO Has patient had a PCN reaction occurring within the last 10 years: NO If all of the above answers are "NO", then may proceed with Cephalosporin use.   Vicodin [  hydrocodone-acetaminophen] Other (See Comments)   passes out        Medication List     TAKE these medications    aspirin 81 MG tablet Take 81 mg by mouth at bedtime.   azelastine 0.1 % nasal spray Commonly known as: ASTELIN Place 2 sprays into both nostrils 2 (two) times daily as needed for rhinitis.    cholecalciferol 25 MCG (1000 UNIT) tablet Commonly known as: VITAMIN D3 Take 1,000 Units by mouth daily.   diclofenac Sodium 1 % Gel Commonly known as: VOLTAREN Apply 2 g topically 2 (two) times daily.   esomeprazole 40 MG capsule Commonly known as: NEXIUM Take 40 mg by mouth 2 (two) times daily.   Finacea 15 % Foam Generic drug: Azelaic Acid Apply 1 application topically as directed. Qd to bid to aa face   gabapentin 300 MG capsule Commonly known as: NEURONTIN Take 300 mg by mouth 2 (two) times daily.   HYDROcodone-acetaminophen 5-325 MG tablet Commonly known as: Norco Take 1-2 tablets by mouth every 4 (four) hours as needed for moderate pain.   Lantus SoloStar 100 UNIT/ML Solostar Pen Generic drug: insulin glargine Inject 28 Units into the skin every morning.   levothyroxine 75 MCG tablet Commonly known as: SYNTHROID Take 75 mcg by mouth daily before breakfast.   linagliptin 5 MG Tabs tablet Commonly known as: TRADJENTA Take 5 mg by mouth daily.   methocarbamol 500 MG tablet Commonly known as: ROBAXIN Take 500 mg by mouth every 8 (eight) hours as needed for muscle spasms.   mometasone 0.1 % cream Commonly known as: ELOCON Apply 1 application topically daily as needed (Rash). Qd to bid up to 5 days a week aa right lower leg prn itching   nitroGLYCERIN 0.4 MG SL tablet Commonly known as: NITROSTAT Place 1 tablet (0.4 mg total) under the tongue every 5 (five) minutes as needed for chest pain.   NOVOLOG FLEXPEN [place] Inject 10 Units into the skin as needed.   ondansetron 4 MG tablet Commonly known as: ZOFRAN Take 4 mg by mouth every 8 (eight) hours as needed for nausea or vomiting.   potassium chloride SA 20 MEQ tablet Commonly known as: KLOR-CON Take 20-40 mEq by mouth See admin instructions. Take 40 mEq by mouth every other day, alternating with 20 mEq on alternate days.   ropinirole 5 MG tablet Commonly known as: REQUIP Take 5 mg by mouth at bedtime.    temazepam 30 MG capsule Commonly known as: RESTORIL Take 30 mg by mouth at bedtime.   torsemide 20 MG tablet Commonly known as: DEMADEX Take 20-40 mg by mouth See admin instructions. Take 40 mg by mouth every other day, alternating with 20 mg on alternate days   traMADol 50 MG tablet Commonly known as: ULTRAM Take 50 mg by mouth every 6 (six) hours as needed for moderate pain or severe pain.   trolamine salicylate 10 % cream Commonly known as: ASPERCREME Apply 1 application topically at bedtime.   TYLENOL ARTHRITIS PAIN PO Take 1,300 mg by mouth every 8 (eight) hours as needed (pain).       ASK your doctor about these medications    albuterol 108 (90 Base) MCG/ACT inhaler Commonly known as: VENTOLIN HFA Inhale 2 puffs into the lungs every 6 (six) hours as needed for wheezing or shortness of breath.   atenolol 25 MG tablet Commonly known as: TENORMIN Take 0.5 tablets (12.5 mg total) by mouth daily.   ipratropium-albuterol 0.5-2.5 (3) MG/3ML Soln  Commonly known as: DUONEB Take 3 mLs by nebulization every 6 (six) hours as needed.   insulin glargine 100 UNIT/ML injection Commonly known as: LANTUS Inject 28 Units into the skin every morning.   Praluent 150 MG/ML [place] Generic drug: Alirocumab INJECT 1 PEN UNDER THE SKIN EVERY 14 DAYS        Allergies  Allergen Reactions   Ezetimibe Diarrhea and Nausea Only   Metoclopramide Diarrhea   Propoxyphene Other (See Comments)    Unsure of reaction type   Statins Other (See Comments)    Leg pain   Tramadol Hives   Ceftin [Cefuroxime Axetil] Diarrhea   Codeine Rash        Penicillins Rash and Other (See Comments)    Has patient had a PCN reaction causing immediate rash, facial/tongue/throat swelling, SOB or lightheadedness with hypotension: NO Has patient had a PCN reaction causing severe rash involving mucus membranes or skin necrosis: NO Has patient had a PCN retioion that required hospitalization NO Has patient had  a PCN reaction occurring within the last 10 years: NO If all of the above answers are "NO", then may proceed with Cephalosporin use.   Vicodin [Hydrocodone-Acetaminophen] Other (See Comments)    passes out    Consultations: Cardiology   Procedures/Studies: DG Chest 2 View  Result Date: 09/07/2020 CLINICAL DATA:  Chest pain EXAM: CHEST - 2 VIEW COMPARISON:  03/04/2019 FINDINGS: Left chest wall pacer device is noted with leads in the right atrial appendage and right ventricle. Heart size is normal. No pleural effusion or edema. No airspace opacities identified. Chronic interstitial coarsening appears similar to the previous chest radiograph. IMPRESSION: No acute cardiopulmonary abnormalities. Electronically Signed   By: Kerby Moors M.D.   On: 09/07/2020 12:06   CT Angio Chest PE W and/or Wo Contrast  Result Date: 09/07/2020 CLINICAL DATA:  Chest pain radiating to the left upper extremity. PE suspected, high prob. EXAM: CT ANGIOGRAPHY CHEST WITH CONTRAST TECHNIQUE: Multidetector CT imaging of the chest was performed using the standard protocol during bolus administration of intravenous contrast. Multiplanar CT image reconstructions and MIPs were obtained to evaluate the vascular anatomy. CONTRAST:  64m OMNIPAQUE IOHEXOL 350 MG/ML SOLN COMPARISON:  Chest radiograph from earlier today. 05/16/2018 chest CT angiogram. FINDINGS: Cardiovascular: The study is high quality for the evaluation of pulmonary embolism. There are no filling defects in the central, lobar, segmental or subsegmental pulmonary artery branches to suggest acute pulmonary embolism. Atherosclerotic nonaneurysmal thoracic aorta. Normal caliber pulmonary arteries. Top-normal heart size. No significant pericardial fluid/thickening. Left anterior descending and right coronary atherosclerosis. Two lead left subclavian pacemaker with lead tips in the right atrium and right ventricular apex. Mediastinum/Nodes: Hypodense left thyroid nodules,  largest 2.4 cm, stable. Unremarkable esophagus. No pathologically enlarged axillary, mediastinal or hilar lymph nodes. Lungs/Pleura: No pneumothorax. No pleural effusion. No acute consolidative airspace disease, lung masses or significant pulmonary nodules. Subpleural calcified subcentimeter posterior left lower lobe granuloma is stable. Upper abdomen: Small hiatal hernia.  Cholecystectomy. Musculoskeletal: No aggressive appearing focal osseous lesions. Marked thoracic spondylosis. Review of the MIP images confirms the above findings. IMPRESSION: 1. No pulmonary embolism. No active pulmonary disease. 2. Two-vessel coronary atherosclerosis. 3. Dominant 2.4 cm hypodense left thyroid nodule. Recommend thyroid [place](ref: J Am Coll Radiol. 2015 Feb;12(2): 143-50). 4. Small hiatal hernia. 5. Aortic Atherosclerosis (ICD10-I70.0). Electronically Signed   By: JIlona SorrelM.D.   On: 09/07/2020 17:50   NM Myocar Multi W/Spect WTamela OddiMotion / EF  Result Date: 09/08/2020 Pharmacological  myocardial perfusion imaging study with no significant  ischemia Normal wall motion, EF estimated at 67% No EKG changes concerning for ischemia at peak stress or in recovery. CT attenuation correction images with mild aortic atherosclerosis and mild coronary calcification Low risk scan Signed, Esmond Plants, MD, Ph.D [facility]   US Venous Img Lower Unilateral Right  Result Date: 09/07/2020 CLINICAL DATA:  Calf pain, edema, recent surgery EXAM: RIGHT LOWER EXTREMITY VENOUS DOPPLER ULTRASOUND TECHNIQUE: Gray-scale sonography with compression, as well as color and duplex ultrasound, were performed to evaluate the deep venous system(s) from the level of the common femoral vein through the popliteal and proximal calf veins. COMPARISON:  06/24/2016 FINDINGS: VENOUS Normal compressibility of the common femoral, superficial femoral, and popliteal veins, as well as the visualized calf veins. Visualized portions of profunda femoral vein and great  saphenous vein unremarkable. No filling defects to suggest DVT on grayscale or color Doppler imaging. Doppler waveforms show normal direction of venous flow, normal respiratory phasicity and response to augmentation. Limited views of the contralateral common femoral vein are unremarkable. OTHER None. Limitations: none IMPRESSION: No femoropopliteal DVT nor evidence of DVT within the visualized calf veins. If clinical symptoms are inconsistent or if there are persistent or worsening symptoms, further imaging (possibly involving the iliac veins) may be warranted. Electronically Signed   By: Lucrezia Europe M.D.   On: 09/07/2020 14:26   ECHOCARDIOGRAM COMPLETE  Result Date: 09/08/2020    ECHOCARDIOGRAM REPORT   Patient Name:   [name] Date of Exam: 09/08/2020 Medical Rec #:  [id number]      Height:       62.0 in Accession #:    [id number]     Weight:       209.0 lb Date of Birth:  01-19-1946      BSA:          1.948 m Patient Age:    28 years       BP:           148/56 mmHg Patient Gender: F              HR:           78 bpm. Exam Location:  [facility] Procedure: 2D Echo, Color Doppler, Cardiac Doppler and Intracardiac            Opacification Agent Indications:     R07.9 Chest Pain  History:         Patient has prior history of Echocardiogram examinations. CHF,                  CAD, Pacemaker; Risk Factors:Hypertension, Diabetes and                  Dyslipidemia.  Sonographer:     Charmayne Sheer RDCS (AE) Referring Phys:  [place] Diagnosing Phys: Ida Rogue MD  Sonographer Comments: Technically difficult study due to poor echo windows. Image acquisition challenging due to patient body habitus. IMPRESSIONS  1. Left ventricular ejection fraction, by estimation, is 60 to 65%. The left ventricle has normal function. The left ventricle has no regional wall motion abnormalities. Left ventricular diastolic parameters are consistent with Grade II diastolic dysfunction (pseudonormalization).  2. Right ventricular  systolic function is normal. The right ventricular size is normal. Tricuspid regurgitation signal is inadequate for assessing PA pressure. FINDINGS  Left Ventricle: Left ventricular ejection fraction, by estimation, is 60 to 65%. The left ventricle has normal function. The left ventricle  has no regional wall motion abnormalities. Definity contrast agent was given IV to delineate the left ventricular  endocardial borders. The left ventricular internal cavity size was normal in size. There is no left ventricular hypertrophy. Left ventricular diastolic parameters are consistent with Grade II diastolic dysfunction (pseudonormalization). Right Ventricle: The right ventricular size is normal. No increase in right ventricular wall thickness. Right ventricular systolic function is normal. Tricuspid regurgitation signal is inadequate for assessing PA pressure. Left Atrium: Left atrial size was normal in size. Right Atrium: Right atrial size was normal in size. Pericardium: There is no evidence of pericardial effusion. Mitral Valve: The mitral valve is normal in structure. No evidence of mitral valve regurgitation. No evidence of mitral valve stenosis. MV peak gradient, 4.8 mmHg. The mean mitral valve gradient is 3.0 mmHg. Tricuspid Valve: The tricuspid valve is normal in structure. Tricuspid valve regurgitation is not demonstrated. No evidence of tricuspid stenosis. Aortic Valve: The aortic valve is normal in structure. Aortic valve regurgitation is not visualized. No aortic stenosis is present. Aortic valve mean gradient measures 5.0 mmHg. Aortic valve peak gradient measures 8.0 mmHg. Aortic valve area, by VTI measures 2.17 cm. Pulmonic Valve: The pulmonic valve was normal in structure. Pulmonic valve regurgitation is not visualized. No evidence of pulmonic stenosis. Aorta: The aortic root is normal in size and structure. Venous: The inferior vena cava is normal in size with greater than 50% respiratory variability,  suggesting right atrial pressure of 3 mmHg. IAS/Shunts: No atrial level shunt detected by color flow Doppler.  LEFT VENTRICLE PLAX 2D LVIDd:         3.90 cm  Diastology LVIDs:         2.80 cm  LV e' medial:    5.77 cm/s LV PW:         1.10 cm  LV E/e' medial:  20.1 LV IVS:        1.00 cm  LV e' lateral:   8.70 cm/s LVOT diam:     2.10 cm  LV E/e' lateral: 13.3 LV SV:         69 LV SV Index:   35 LVOT Area:     3.46 cm  RIGHT VENTRICLE RV Basal diam:  3.70 cm RV S prime:     12.00 cm/s LEFT ATRIUM             Index       RIGHT ATRIUM           Index LA diam:        4.10 cm 2.10 cm/m  RA Area:     16.00 cm LA Vol (A2C):   22.3 ml 11.45 ml/m RA Volume:   39.70 ml  20.38 ml/m LA Vol (A4C):   16.1 ml 8.27 ml/m LA Biplane Vol: 20.2 ml 10.37 ml/m  AORTIC VALVE                    PULMONIC VALVE AV Area (Vmax):    2.45 cm     PV Vmax:       0.84 m/s AV Area (Vmean):   2.25 cm     PV Vmean:      56.600 cm/s AV Area (VTI):     2.17 cm     PV VTI:        0.176 m AV Vmax:           141.00 cm/s  PV Peak grad:  2.9 mmHg AV Vmean:  101.000 cm/s PV Mean grad:  1.0 mmHg AV VTI:            0.316 m AV Peak Grad:      8.0 mmHg AV Mean Grad:      5.0 mmHg LVOT Vmax:         99.80 cm/s LVOT Vmean:        65.700 cm/s LVOT VTI:          0.198 m LVOT/AV VTI ratio: 0.63  AORTA Ao Root diam: 3.00 cm MITRAL VALVE MV Area (PHT): 4.80 cm     SHUNTS MV Area VTI:   2.22 cm     Systemic VTI:  0.20 m MV Peak grad:  4.8 mmHg     Systemic Diam: 2.10 cm MV Mean grad:  3.0 mmHg MV Vmax:       1.10 m/s MV Vmean:      78.8 cm/s MV Decel Time: 158 msec MV E velocity: 116.00 cm/s MV A velocity: 109.00 cm/s MV E/A ratio:  1.06 Ida Rogue MD Electronically signed by Ida Rogue MD Signature Date/Time: 09/08/2020/1:39:50 PM    Final    CUP PACEART REMOTE DEVICE CHECK  Result Date: 08/31/2020 Scheduled remote reviewed. Normal device function.  Next remote 91 days. LR  (Echo, Carotid, EGD, Colonoscopy, ERCP)     Subjective: Patient seen and examined on the day of discharge.  Stable no distress.  Chest pain resolved.  Stable for discharge home.  Discharge Exam: Vitals:   09/08/20 0930 09/08/20 1500  BP:  107/63  Pulse: 73   Resp: 16 18  Temp:  (!) 97.5 F (36.4 C)  SpO2: 100% 98%   Vitals:   09/08/20 0704 09/08/20 0900 09/08/20 0930 09/08/20 1500  BP:  (!) 119/59  107/63  Pulse:  71 73   Resp:  '17 16 18  '$ Temp:    (!) 97.5 F (36.4 C)  TempSrc:    Oral  SpO2: 96% 98% 100% 98%  Weight:      Height:        General: Pt is alert, awake, not in acute distress Cardiovascular: RRR, S1/S2 +, no rubs, no gallops Respiratory: CTA bilaterally, no wheezing, no rhonchi Abdominal: Soft, NT, ND, bowel sounds + Extremities: no edema, no cyanosis    The results of significant diagnostics from this hospitalization (including imaging, microbiology, ancillary and laboratory) are listed below for reference.     Microbiology: Recent Results (from the past 240 hour(s))  Resp Panel by RT-PCR (Flu A&B, Covid) Nasopharyngeal Swab     Status: None   Collection Time: 09/07/20 12:30 PM   Specimen: Nasopharyngeal Swab; Nasopharyngeal(NP) swabs in vial transport medium  Result Value Ref Range Status   SARS Coronavirus 2 by RT PCR NEGATIVE NEGATIVE Final    Comment: (NOTE) SARS-CoV-2 target nucleic acids are NOT DETECTED.  The SARS-CoV-2 RNA is generally detectable in upper respiratory specimens during the acute phase of infection. The lowest concentration of SARS-CoV-2 viral copies this assay can detect is 138 copies/mL. A negative result does not preclude SARS-Cov-2 infection and should not be used as the sole basis for treatment or other patient management decisions. A negative result may occur with  improper specimen collection/handling, submission of specimen other than nasopharyngeal swab, presence of viral mutation(s) within the areas targeted by this assay, and inadequate number of  viral copies(<138 copies/mL). A negative result must be combined with clinical observations, patient history, and epidemiological information. The expected result is Negative.  Fact Sheet  for Patients:  EntrepreneurPulse.com.au  Fact Sheet for Healthcare Providers:  IncredibleEmployment.be  This test is no t yet approved or cleared by the [place] FDA and  has been authorized for detection and/or diagnosis of SARS-CoV-2 by FDA under an Emergency Use Authorization (EUA). This EUA will remain  in effect (meaning this test can be used) for the duration of the COVID-19 declaration under Section 564(b)(1) of the Act, 21 U.S.C.section 360bbb-3(b)(1), unless the authorization is terminated  or revoked sooner.       Influenza A by PCR NEGATIVE NEGATIVE Final   Influenza B by PCR NEGATIVE NEGATIVE Final    Comment: (NOTE) The Xpert Xpress SARS-CoV-2/FLU/RSV plus assay is intended as an aid in the diagnosis of influenza from Nasopharyngeal swab specimens and should not be used as a sole basis for treatment. Nasal washings and aspirates are unacceptable for Xpert Xpress SARS-CoV-2/FLU/RSV testing.  Fact Sheet for Patients: EntrepreneurPulse.com.au  Fact Sheet for Healthcare Providers: IncredibleEmployment.be  This test is not yet approved or cleared by the [place] FDA and has been authorized for detection and/or diagnosis of SARS-CoV-2 by FDA under an Emergency Use Authorization (EUA). This EUA will remain in effect (meaning this test can be used) for the duration of the COVID-19 declaration under Section 564(b)(1) of the Act, 21 U.S.C. section 360bbb-3(b)(1), unless the authorization is terminated or revoked.  Performed at [facility], [address] [place],  [address]      Labs: BNP (last 3 results) Recent Labs    09/07/20 1126  BNP [id number]   Basic Metabolic Panel: Recent  Labs  Lab 09/07/20 1126  NA 137  K 4.2  CL 97*  CO2 27  GLUCOSE 189*  BUN 29*  CREATININE 1.42*  CALCIUM 9.5   Liver Function Tests: Recent Labs  Lab 09/07/20 1126  AST 16  ALT 17  ALKPHOS 143*  BILITOT 0.6  PROT 7.9  ALBUMIN 4.1   Recent Labs  Lab 09/07/20 1126  LIPASE 54*   No results for input(s): AMMONIA in the last 168 hours. CBC: Recent Labs  Lab 09/07/20 1126  WBC 6.6  HGB 12.3  HCT 38.1  MCV 84.1  PLT 207   Cardiac Enzymes: No results for input(s): CKTOTAL, CKMB, CKMBINDEX, TROPONINI in the last 168 hours. BNP: Invalid input(s): POCBNP CBG: Recent Labs  Lab 09/08/20 0708 09/08/20 1512  GLUCAP 106* 118*   D-Dimer Recent Labs    09/07/20 1237  DDIMER 0.62*   Hgb A1c No results for input(s): HGBA1C in the last 72 hours. Lipid Profile Recent Labs    09/08/20 0613  CHOL 163  HDL 44  LDLCALC 81  TRIG 192*  CHOLHDL 3.7   Thyroid function studies No results for input(s): TSH, T4TOTAL, T3FREE, THYROIDAB in the last 72 hours.  Invalid input(s): FREET3 Anemia work up No results for input(s): VITAMINB12, FOLATE, FERRITIN, TIBC, IRON, RETICCTPCT in the last 72 hours. Urinalysis    Component Value Date/Time   COLORURINE YELLOW (A) 02/22/2018 0302   APPEARANCEUR CLEAR (A) 02/22/2018 0302   LABSPEC 1.022 02/22/2018 0302   PHURINE 6.0 02/22/2018 0302   GLUCOSEU >=500 (A) 02/22/2018 0302   HGBUR NEGATIVE 02/22/2018 0302   BILIRUBINUR NEGATIVE 02/22/2018 0302   KETONESUR NEGATIVE 02/22/2018 0302   PROTEINUR NEGATIVE 02/22/2018 0302   NITRITE NEGATIVE 02/22/2018 0302   LEUKOCYTESUR NEGATIVE 02/22/2018 0302   Sepsis Labs Invalid input(s): PROCALCITONIN,  WBC,  LACTICIDVEN Microbiology Recent Results (from the past 240 hour(s))  Resp Panel  by RT-PCR (Flu A&B, Covid) Nasopharyngeal Swab     Status: None   Collection Time: 09/07/20 12:30 PM   Specimen: Nasopharyngeal Swab; Nasopharyngeal(NP) swabs in vial transport medium  Result Value  Ref Range Status   SARS Coronavirus 2 by RT PCR NEGATIVE NEGATIVE Final    Comment: (NOTE) SARS-CoV-2 target nucleic acids are NOT DETECTED.  The SARS-CoV-2 RNA is generally detectable in upper respiratory specimens during the acute phase of infection. The lowest concentration of SARS-CoV-2 viral copies this assay can detect is 138 copies/mL. A negative result does not preclude SARS-Cov-2 infection and should not be used as the sole basis for treatment or other patient management decisions. A negative result may occur with  improper specimen collection/handling, submission of specimen other than nasopharyngeal swab, presence of viral mutation(s) within the areas targeted by this assay, and inadequate number of viral copies(<138 copies/mL). A negative result must be combined with clinical observations, patient history, and epidemiological information. The expected result is Negative.  Fact Sheet for Patients:  EntrepreneurPulse.com.au  Fact Sheet for Healthcare Providers:  IncredibleEmployment.be  This test is no t yet approved or cleared by the [place] FDA and  has been authorized for detection and/or diagnosis of SARS-CoV-2 by FDA under an Emergency Use Authorization (EUA). This EUA will remain  in effect (meaning this test can be used) for the duration of the COVID-19 declaration under Section 564(b)(1) of the Act, 21 U.S.C.section 360bbb-3(b)(1), unless the authorization is terminated  or revoked sooner.       Influenza A by PCR NEGATIVE NEGATIVE Final   Influenza B by PCR NEGATIVE NEGATIVE Final    Comment: (NOTE) The Xpert Xpress SARS-CoV-2/FLU/RSV plus assay is intended as an aid in the diagnosis of influenza from Nasopharyngeal swab specimens and should not be used as a sole basis for treatment. Nasal washings and aspirates are unacceptable for Xpert Xpress SARS-CoV-2/FLU/RSV testing.  Fact Sheet for  Patients: EntrepreneurPulse.com.au  Fact Sheet for Healthcare Providers: IncredibleEmployment.be  This test is not yet approved or cleared by the [place] FDA and has been authorized for detection and/or diagnosis of SARS-CoV-2 by FDA under an Emergency Use Authorization (EUA). This EUA will remain in effect (meaning this test can be used) for the duration of the COVID-19 declaration under Section 564(b)(1) of the Act, 21 U.S.C. section 360bbb-3(b)(1), unless the authorization is terminated or revoked.  Performed at [facility], [address], [place], [place] [address]      Time coordinating discharge: Over 30 minutes  SIGNED:   Sidney Ace, MD  [facility] 09/08/2020, 3:47 PM Pager   If 7PM-7AM, please contact night-coverage

## 2020-09-08 NOTE — ED Notes (Signed)
Pt still in procedure

## 2020-09-08 NOTE — ED Notes (Signed)
Pt taken to ECHO

## 2020-09-08 NOTE — Consult Note (Addendum)
Cardiology Consult    Patient ID: [name] MRN: [id number], DOB/AGE: 1946-01-19   Admit date: 09/07/2020 Date of Consult: 09/08/2020  Primary Physician: Rusty Aus, MD Primary Cardiologist: Ida Rogue, MD Requesting Provider: Chauncey Cruel. Priscella Mann, MD  Patient Profile    [name] is a 75 y.o. female with a history of CAD, DM, HL, obesity, PAT, symptomatic bradycardia w/ pauses s/p PPM (2021), lymphedema, depression, and CKD III, who is being seen today for the evaluation of chest pain at the request of Dr. Priscella Mann.  Past Medical History   Past Medical History:  Diagnosis Date   Acquired elevated diaphragm    HIGHER ON RIGHT SIDE   Anemia    Anxiety    Aortic atherosclerosis (HCC)    Arthritis    CAD (coronary artery disease)    a. 07/2017 MV: Low risk; b. 09/2017 Cath: LM nl, LAD 50p/m, D1 80ost, LCX large/nl, RCA small/nl.   Chronic back pain    stenosis.degenerative disc,some scoliosis   Chronic heart failure with preserved ejection fraction (HFpEF) ([place])    a. 07/2017 Echo: EF 60-65%, no rwma, gr2 DD, nl RV fxn.   Chronic kidney disease    stage 3   Complication of anesthesia 2018   aspirated during wrist surgery during LMA removal   Constipation    takes Stool Softener daily   Coronary artery disease    Depression    takes Cymbalta daily   Diabetes mellitus    Type 2 diabetic. Average fasting blood sugar runs high 99991111   Diastolic CHF ([place]) [id number]   Per patient, diagnosed in 2018   Diverticulosis    Dysplastic nevus 09/12/2017   L sup buttock - mild    Dysplastic nevus 09/18/2018   R sup med buttocks - mild    E coli infection    GERD (gastroesophageal reflux disease)    takes Nexium daily   Grade II diastolic dysfunction    Headache    Hemorrhoids    History of colon polyps    benign   History of gout    doesn't take any meds   History of hiatal hernia    small   History of vertigo    doesn't take any meds   Hyperlipidemia    takes  Praluent daily   Hypertension    currently BP medications are on hold    Hypothyroidism    takes Synthroid daily   Insomnia    takes Restoril nightly   Muscle spasm    takes Robaxin as needed   NSVT (nonsustained ventricular tachycardia) (HCC)    OSA on CPAP    Paroxysmal atrial tachycardia ([place])    Peripheral vascular disease ([place])    AAA as stated per pt / was just discovered and pt states has not been referred to vascular MD    Presence of permanent cardiac pacemaker    Restless leg    takes Requip daily   Rosacea    Symptomatic bradycardia    a. 02/2019 s/p Biotronik [place] 8 DR-T DC PPM   Syncope    a. Documented bradycardia and pauses-->02/2019 s/p Biotronik [place] 8 DR-T DC PPM.   Varicose veins     Past Surgical History:  Procedure Laterality Date   ABDOMINAL HYSTERECTOMY     with BSo   CARDIAC CATHETERIZATION  2013   Normal   CARPAL TUNNEL RELEASE Bilateral    CATARACT EXTRACTION, BILATERAL     CHOLECYSTECTOMY  COLONOSCOPY     COLONOSCOPY WITH PROPOFOL N/A 11/25/2018   Procedure: COLONOSCOPY WITH PROPOFOL;  Surgeon: Toledo, Benay Pike, MD;  Location: [facility] ENDOSCOPY;  Service: Gastroenterology;  Laterality: N/A;   DIAGNOSTIC LAPAROSCOPY     multiple times   DILATION AND CURETTAGE OF UTERUS     ESOPHAGOGASTRODUODENOSCOPY (EGD) WITH PROPOFOL N/A 11/01/2014   Procedure: ESOPHAGOGASTRODUODENOSCOPY (EGD) WITH PROPOFOL;  Surgeon: Hulen Luster, MD;  Location: [facility] ENDOSCOPY;  Service: Gastroenterology;  Laterality: N/A;   ESOPHAGOGASTRODUODENOSCOPY (EGD) WITH PROPOFOL N/A 11/25/2018   Procedure: ESOPHAGOGASTRODUODENOSCOPY (EGD) WITH PROPOFOL;  Surgeon: Toledo, Benay Pike, MD;  Location: [facility] ENDOSCOPY;  Service: Gastroenterology;  Laterality: N/A;   HARDWARE REMOVAL Left 10/11/2016   Procedure: HARDWARE REMOVAL-LEFT RADIUS;  Surgeon: Lovell Sheehan, MD;  Location: [facility] ORS;  Service: Orthopedics;  Laterality: Left;  Left Radius Wrist    IMPLANTABLE CONTACT LENS IMPLANTATION      bilateral   KNEE ARTHROSCOPY Right 05/09/2020   Procedure: ARTHROSCOPY KNEE;  Surgeon: Dereck Leep, MD;  Location: [facility] ORS;  Service: Orthopedics;  Laterality: Right;   LAMINECTOMY  11/13/2015   LEFT HEART CATH AND CORONARY ANGIOGRAPHY Left 10/01/2017   Procedure: LEFT HEART CATH AND CORONARY ANGIOGRAPHY;  Surgeon: Nelva Bush, MD;  Location: [place] [facility];  Service: Cardiovascular;  Laterality: Left;   LOOP RECORDER INSERTION N/A 10/17/2017   Procedure: LOOP RECORDER INSERTION;  Surgeon: Deboraha Sprang, MD;  Location: [place] [facility];  Service: Cardiovascular;  Laterality: N/A;   LOOP RECORDER REMOVAL N/A 03/04/2019   Procedure: LOOP RECORDER REMOVAL;  Surgeon: Deboraha Sprang, MD;  Location: [place] [facility];  Service: Cardiovascular;  Laterality: N/A;   LUMBAR FUSION  11/2015   LUMBAR WOUND DEBRIDEMENT N/A 12/02/2015   Procedure: WOUND Exploration;  Surgeon: Consuella Lose, MD;  Location: [place];  Service: Neurosurgery;  Laterality: N/A;   OPEN REDUCTION INTERNAL FIXATION (ORIF) DISTAL RADIAL FRACTURE Left 08/29/2016   Procedure: OPEN REDUCTION INTERNAL FIXATION (ORIF) DISTAL RADIAL FRACTURE;  Surgeon: Lovell Sheehan, MD;  Location: [facility] ORS;  Service: Orthopedics;  Laterality: Left;   PACEMAKER IMPLANT N/A 03/04/2019   Procedure: PACEMAKER IMPLANT;  Surgeon: Deboraha Sprang, MD;  Location: [place] [facility];  Service: Cardiovascular;  Laterality: N/A;   PICC LINE PLACE PERIPHERAL ([place] HX)     right upper arm    ROTATOR CUFF REPAIR Right    SAVORY DILATION N/A 11/01/2014   Procedure: SAVORY DILATION;  Surgeon: Hulen Luster, MD;  Location: [facility] ENDOSCOPY;  Service: Gastroenterology;  Laterality: N/A;   TONSILLECTOMY     TRIGGER FINGER RELEASE Bilateral      Allergies  Allergies  Allergen Reactions   Ezetimibe Diarrhea and Nausea Only   Metoclopramide Diarrhea   Propoxyphene Other (See Comments)    Unsure of reaction type   Statins Other (See Comments)    Leg  pain   Tramadol Hives   Ceftin [Cefuroxime Axetil] Diarrhea   Codeine Rash        Penicillins Rash and Other (See Comments)    Has patient had a PCN reaction causing immediate rash, facial/tongue/throat swelling, SOB or lightheadedness with hypotension: NO Has patient had a PCN reaction causing severe rash involving mucus membranes or skin necrosis: NO Has patient had a PCN retioion that required hospitalization NO Has patient had a PCN reaction occurring within the last 10 years: NO If all of the above answers are "NO", then may proceed with Cephalosporin use.   Vicodin [  Hydrocodone-Acetaminophen] Other (See Comments)    passes out    History of Present Illness    75 y/o ? w/ the above complex PMH including  An [place] is a 75 y.o. female with a history of CAD, DM, HL, obesity, PAT, symptomatic bradycardia w/ pauses s/p PPM (2021), lymphedema, depression, and CKD III.  She was prev eval for c/p in the summer of 2019 w/ chest pain.  MV was low risk.  Cor CTA was equivocal but concerning for LAD dzs.  Cath showed 80% ostial diag stenosis w/ 50 p/m LAD.  She has been medically managed and is Rx ranexa, which she admits to often missing.  In 02/2019 she underwent PPM placement after experiencing syncope w/ loop monitoring showing prolonged bradycardia and pauses up to 13 secs.  At baseline, she is sedentary and has chronic DOE.  She does not typically exp chest pain, but notes that over the past month or more, she has taken nitro on a few occasions for left upper arm discomfort that she identifies as angina.  She was in her usual state of health until approximately 3 PM on July 27, when she was sitting and "doing nothing" when she developed 5/10 left arm discomfort that moved into her left chest.  This was associated with mild nausea and dyspnea.  She took 2 sublingual nitroglycerin tablets with minimal relief of discomfort.  Within a short period of time, discomfort worsened again and she took 2  additional several nitroglycerin tablets.  Symptoms persisted throughout the late afternoon and early evening and she decided to go to bed early.  She was eventually able to fall asleep and awoke at about 3 AM to take dog and noted more severe left arm and shoulder pain, which she felt was consistent with angina.  She took additional nitroglycerin without significant relief and her husband brought her to the emergency department.  Here, she was hypertensive on arrival at 174/82.  ECG showed atrial paced rhythm without acute ST or T changes.  Troponins have been normal.  Creatinine slightly elevated above baseline at 1.42.  D-dimer was mildly elevated 0.62.  CTA of the chest was negative for PE.  Left thyroid nodule was incidentally noted.  Lower extremity ultrasound was negative for DVT.  Patient was provided morphine early this morning and is currently chest pain-free.  Inpatient Medications     aspirin EC  81 mg Oral QHS   gabapentin  300 mg Oral TID   heparin  5,000 Units Subcutaneous Q8H   insulin aspart  0-9 Units Subcutaneous TID WC   insulin glargine-yfgn  20 Units Subcutaneous QHS   levothyroxine  75 mcg Oral QAC breakfast   ropinirole  5 mg Oral QHS   torsemide  20 mg Oral Daily    Family History    Family History  Problem Relation Age of Onset   Heart disease Mother    Breast cancer Mother 85   Heart disease Father    Heart attack Sister    Heart disease Sister    Heart disease Brother    She indicated that her mother is deceased. She indicated that her father is deceased. She indicated that her sister is alive. She indicated that both of her brothers are alive.   Social History    Social History   Socioeconomic History   Marital status: Married    Spouse name: Not on file   Number of children: Not on file   Years of education:  Not on file   Highest education level: Not on file  Occupational History   Occupation: retired  Tobacco Use   Smoking status: Never    Smokeless tobacco: Never  Vaping Use   Vaping Use: Never used  Substance and Sexual Activity   Alcohol use: No   Drug use: No   Sexual activity: Not on file  Other Topics Concern   Not on file  Social History Narrative   Not on file   Social Determinants of Health   Financial Resource Strain: Not on file  Food Insecurity: Not on file  Transportation Needs: Not on file  Physical Activity: Not on file  Stress: Not on file  Social Connections: Not on file  Intimate Partner Violence: Not on file     Review of Systems    General:  No chills, fever, night sweats or weight changes.  Cardiovascular:  +++ chest pain, +++ chronic dyspnea on exertion, +++ lower extremity edema she does not take diuretic, no orthopnea, palpitations, paroxysmal nocturnal dyspnea. Dermatological: No rash, lesions/masses Respiratory: No cough, +++ chronic dyspnea on exertion Urologic: No hematuria, dysuria Abdominal:   +++ nausea in the setting of chest pain yesterday and again this morning vomiting, diarrhea, bright red blood per rectum, melena, or hematemesis Neurologic:  No visual changes, wkns, changes in mental status. All other systems reviewed and are otherwise negative except as noted above.  Physical Exam    Blood pressure (!) 115/59, pulse 81, temperature 97.8 F (36.6 C), temperature source Oral, resp. rate 20, height '5\' 2"'$  (1.575 m), weight 94.8 kg, SpO2 96 %.  General: Pleasant, obese NAD Psych: Normal affect. Neuro: Alert and oriented X 3. Moves all extremities spontaneously. HEENT: Normal  Neck: Supple without bruits.  Obese, difficult to gauge JVP. Lungs:  Resp regular and unlabored, CTA. Heart: RRR no s3, s4, or murmurs. Abdomen: Obese, soft, non-tender, non-distended, BS + x 4.  Extremities: No clubbing, cyanosis or edema. DP/PT2+, Radials 2+ and equal bilaterally.  Labs    Cardiac Enzymes Recent Labs  Lab 09/07/20 1126 09/07/20 1230 09/07/20 2055 09/08/20 0613  TROPONINIHS  '2 2 3 '$ <2      Lab Results  Component Value Date   WBC 6.6 09/07/2020   HGB 12.3 09/07/2020   HCT 38.1 09/07/2020   MCV 84.1 09/07/2020   PLT 207 09/07/2020    Recent Labs  Lab 09/07/20 1126  NA 137  K 4.2  CL 97*  CO2 27  BUN 29*  CREATININE 1.42*  CALCIUM 9.5  PROT 7.9  BILITOT 0.6  ALKPHOS 143*  ALT 17  AST 16  GLUCOSE 189*   Lab Results  Component Value Date   CHOL 163 09/08/2020   HDL 44 09/08/2020   LDLCALC 81 09/08/2020   TRIG 192 (H) 09/08/2020   Lab Results  Component Value Date   DDIMER 0.62 (H) 09/07/2020     Radiology Studies    DG Chest 2 View  Result Date: 09/07/2020 CLINICAL DATA:  Chest pain EXAM: CHEST - 2 VIEW COMPARISON:  03/04/2019 FINDINGS: Left chest wall pacer device is noted with leads in the right atrial appendage and right ventricle. Heart size is normal. No pleural effusion or edema. No airspace opacities identified. Chronic interstitial coarsening appears similar to the previous chest radiograph. IMPRESSION: No acute cardiopulmonary abnormalities. Electronically Signed   By: Kerby Moors M.D.   On: 09/07/2020 12:06   CT Angio Chest PE W and/or Wo Contrast  Result Date: 09/07/2020 CLINICAL  DATA:  Chest pain radiating to the left upper extremity. PE suspected, high prob. EXAM: CT ANGIOGRAPHY CHEST WITH CONTRAST TECHNIQUE: Multidetector CT imaging of the chest was performed using the standard protocol during bolus administration of intravenous contrast. Multiplanar CT image reconstructions and MIPs were obtained to evaluate the vascular anatomy. CONTRAST:  62m OMNIPAQUE IOHEXOL 350 MG/ML SOLN COMPARISON:  Chest radiograph from earlier today. 05/16/2018 chest CT angiogram. FINDINGS: Cardiovascular: The study is high quality for the evaluation of pulmonary embolism. There are no filling defects in the central, lobar, segmental or subsegmental pulmonary artery branches to suggest acute pulmonary embolism. Atherosclerotic nonaneurysmal thoracic  aorta. Normal caliber pulmonary arteries. Top-normal heart size. No significant pericardial fluid/thickening. Left anterior descending and right coronary atherosclerosis. Two lead left subclavian pacemaker with lead tips in the right atrium and right ventricular apex. Mediastinum/Nodes: Hypodense left thyroid nodules, largest 2.4 cm, stable. Unremarkable esophagus. No pathologically enlarged axillary, mediastinal or hilar lymph nodes. Lungs/Pleura: No pneumothorax. No pleural effusion. No acute consolidative airspace disease, lung masses or significant pulmonary nodules. Subpleural calcified subcentimeter posterior left lower lobe granuloma is stable. Upper abdomen: Small hiatal hernia.  Cholecystectomy. Musculoskeletal: No aggressive appearing focal osseous lesions. Marked thoracic spondylosis. Review of the MIP images confirms the above findings. IMPRESSION: 1. No pulmonary embolism. No active pulmonary disease. 2. Two-vessel coronary atherosclerosis. 3. Dominant 2.4 cm hypodense left thyroid nodule. Recommend thyroid [place](ref: J Am Coll Radiol. 2015 Feb;12(2): 143-50). 4. Small hiatal hernia. 5. Aortic Atherosclerosis (ICD10-I70.0). Electronically Signed   By: JIlona SorrelM.D.   On: 09/07/2020 17:50   [place] Img Lower Unilateral Right  Result Date: 09/07/2020 CLINICAL DATA:  Calf pain, edema, recent surgery EXAM: RIGHT LOWER EXTREMITY VENOUS DOPPLER ULTRASOUND TECHNIQUE: Gray-scale sonography with compression, as well as color and duplex ultrasound, were performed to evaluate the deep venous system(s) from the level of the common femoral vein through the popliteal and proximal calf veins. COMPARISON:  06/24/2016 FINDINGS: VENOUS Normal compressibility of the common femoral, superficial femoral, and popliteal veins, as well as the visualized calf veins. Visualized portions of profunda femoral vein and great saphenous vein unremarkable. No filling defects to suggest DVT on grayscale or color Doppler imaging.  Doppler waveforms show normal direction of venous flow, normal respiratory phasicity and response to augmentation. Limited views of the contralateral common femoral vein are unremarkable. OTHER None. Limitations: none IMPRESSION: No femoropopliteal DVT nor evidence of DVT within the visualized calf veins. If clinical symptoms are inconsistent or if there are persistent or worsening symptoms, further imaging (possibly involving the iliac veins) may be warranted. Electronically Signed   By: DLucrezia EuropeM.D.   On: 09/07/2020 14:26   CUP PACEART REMOTE DEVICE CHECK  Result Date: 08/31/2020 Scheduled remote reviewed. Normal device function.  Next remote 91 days. LR   ECG & Cardiac Imaging    A paced, V sensed, 80, no acute ST or T changes- personally reviewed.  Assessment & Plan    1.  Unstable angina/coronary artery disease: Patient with a prior history of angina and evaluation in 2019 including a low risk stress test, coronary CTA concerning for LAD disease, and diagnostic catheterization showing severe diagonal disease (small vessel) and moderate proximal/mid LAD disease.  She has been medically managed since then and typically does not experience angina however, over the past few weeks, she has had to take several nitroglycerin for left arm pain.  She began having recurrent and somewhat constant left shoulder and chest discomfort beginning yesterday around  3 PM which persisted throughout the night and seem to worsen when she got up at 3 AM.  She did note associated dyspnea and nausea.  Symptoms eased some with several nitroglycerin but did not completely resolve, which prompted ED eval.  She is currently chest pain-free after receiving morphine earlier this morning.  ECG without acute changes.  D-dimer was mildly elevated though CTA was negative for PE and lower extremity ultrasound was negative for DVT.  Troponins are normal.  Given prior history and presentation, we have arranged for a Lexiscan Myoview  this morning to rule out ischemia.  The benefits (risk stratification, diagnosing coronary artery disease, treatment guidance) and risks [chest pain, dyspnea, cardiac arrhythmias, dizziness, low blood pressure, allergic reaction, heart attack, and life-threatening complications (estimated to be 1 in 10,000)] were discussed in detail with [name] and she agrees to proceed.  She is on Ranexa at home but notes that she has not been "taking it religiously."  I will resume Ranexa.  Continue aspirin.  She is on atenolol at home and with CKD 3, will switch to metoprolol.  Statin intolerant-on Praluent at home.  Provided that stress testing is normal, she can likely be discharged home this afternoon.  2.  Essential hypertension: Blood pressure was elevated on arrival though is currently normal at 115/59.  We will resume beta-blocker therapy.  3.  Hyperlipidemia: LDL of 81.  She remains on Praluent therapy in the outpatient setting.  4.  HFpEF: Body habitus makes exam challenging though she appears euvolemic on examination.  Heart rate and blood pressure stable.  We will hold torsemide this morning in the setting of slight rise in creatinine and contrast earlier this morning.  5.  Stage III chronic kidney disease: Creatinine up slightly above baseline at 1.42.  She did receive contrast in the setting of CTA early this morning.  We will hold torsemide this morning.  6.  Type 2 diabetes mellitus: Insulin management per medicine team.  7.  Paroxysmal atrial tachycardia: Quiescent on outpatient beta-blocker therapy.  8.  Sick sinus syndrome: Status post permanent pacemaker January 2021.  A pacing and V sensing without acute changes on ECG today.  Signed, Murray Hodgkins, NP 09/08/2020, 8:12 AM  For questions or updates, please contact   Please consult [email or web address] for contact info under Cardiology/STEMI.

## 2020-09-08 NOTE — Care Management (Signed)
Brief Hospital course update  75 year old female with history of coronary artery disease, hyperlipidemia, obesity, paroxysmal atrial tachycardia, symptomatic rated cardia with pauses status post permanent pacemaker, CKD stage III who was seen in the emergency room for left arm pain, chest pain, jaw pain.  Cardiology is consulted on admission who recommended nuclear stress testing.  Patient undergo stress testing today 7/28.  Results of stress test are negative patient may build to discharge home this afternoon.  We will follow-up after stress test is read to determine disposition plan.  Ralene Muskrat MD

## 2020-09-08 NOTE — ED Notes (Signed)
Pt calls out and states she is nauseated, PRN Zofran given. Call bell in reach.   Warm blanket provided at this time.

## 2020-09-08 NOTE — Progress Notes (Signed)
[name] to be D/C'd Home per MD order.  Discussed prescriptions and follow up appointments with the patient. Prescriptions given to patient, medication list explained in detail. Pt verbalized understanding.  Allergies as of 09/08/2020       Reactions   Ezetimibe Diarrhea, Nausea Only   Metoclopramide Diarrhea   Propoxyphene Other (See Comments)   Unsure of reaction type   Statins Other (See Comments)   Leg pain   Tramadol Hives   Ceftin [cefuroxime Axetil] Diarrhea   Codeine Rash       Penicillins Rash, Other (See Comments)   Has patient had a PCN reaction causing immediate rash, facial/tongue/throat swelling, SOB or lightheadedness with hypotension: NO Has patient had a PCN reaction causing severe rash involving mucus membranes or skin necrosis: NO Has patient had a PCN retioion that required hospitalization NO Has patient had a PCN reaction occurring within the last 10 years: NO If all of the above answers are "NO", then may proceed with Cephalosporin use.   Vicodin [hydrocodone-acetaminophen] Other (See Comments)   passes out        Medication List     TAKE these medications    aspirin 81 MG tablet Take 81 mg by mouth at bedtime.   azelastine 0.1 % nasal spray Commonly known as: ASTELIN Place 2 sprays into both nostrils 2 (two) times daily as needed for rhinitis.   cholecalciferol 25 MCG (1000 UNIT) tablet Commonly known as: VITAMIN D3 Take 1,000 Units by mouth daily.   diclofenac Sodium 1 % Gel Commonly known as: VOLTAREN Apply 2 g topically 2 (two) times daily.   esomeprazole 40 MG capsule Commonly known as: NEXIUM Take 40 mg by mouth 2 (two) times daily.   Finacea 15 % Foam Generic drug: Azelaic Acid Apply 1 application topically as directed. Qd to bid to aa face   gabapentin 300 MG capsule Commonly known as: NEURONTIN Take 300 mg by mouth 2 (two) times daily.   HYDROcodone-acetaminophen 5-325 MG tablet Commonly known as: Norco Take 1-2 tablets  by mouth every 4 (four) hours as needed for moderate pain.   Lantus SoloStar 100 UNIT/ML Solostar Pen Generic drug: insulin glargine Inject 28 Units into the skin every morning.   levothyroxine 75 MCG tablet Commonly known as: SYNTHROID Take 75 mcg by mouth daily before breakfast.   linagliptin 5 MG Tabs tablet Commonly known as: TRADJENTA Take 5 mg by mouth daily.   methocarbamol 500 MG tablet Commonly known as: ROBAXIN Take 500 mg by mouth every 8 (eight) hours as needed for muscle spasms.   mometasone 0.1 % cream Commonly known as: ELOCON Apply 1 application topically daily as needed (Rash). Qd to bid up to 5 days a week aa right lower leg prn itching   nitroGLYCERIN 0.4 MG SL tablet Commonly known as: NITROSTAT Place 1 tablet (0.4 mg total) under the tongue every 5 (five) minutes as needed for chest pain.   NOVOLOG FLEXPEN [place] Inject 10 Units into the skin as needed.   ondansetron 4 MG tablet Commonly known as: ZOFRAN Take 4 mg by mouth every 8 (eight) hours as needed for nausea or vomiting.   potassium chloride SA 20 MEQ tablet Commonly known as: KLOR-CON Take 20-40 mEq by mouth See admin instructions. Take 40 mEq by mouth every other day, alternating with 20 mEq on alternate days.   ropinirole 5 MG tablet Commonly known as: REQUIP Take 5 mg by mouth at bedtime.   temazepam 30 MG capsule Commonly  known as: RESTORIL Take 30 mg by mouth at bedtime.   torsemide 20 MG tablet Commonly known as: DEMADEX Take 20-40 mg by mouth See admin instructions. Take 40 mg by mouth every other day, alternating with 20 mg on alternate days   traMADol 50 MG tablet Commonly known as: ULTRAM Take 50 mg by mouth every 6 (six) hours as needed for moderate pain or severe pain.   trolamine salicylate 10 % cream Commonly known as: ASPERCREME Apply 1 application topically at bedtime.   TYLENOL ARTHRITIS PAIN PO Take 1,300 mg by mouth every 8 (eight) hours as needed (pain).        ASK your doctor about these medications    albuterol 108 (90 Base) MCG/ACT inhaler Commonly known as: VENTOLIN HFA Inhale 2 puffs into the lungs every 6 (six) hours as needed for wheezing or shortness of breath.   atenolol 25 MG tablet Commonly known as: TENORMIN Take 0.5 tablets (12.5 mg total) by mouth daily.   ipratropium-albuterol 0.5-2.5 (3) MG/3ML Soln Commonly known as: DUONEB Take 3 mLs by nebulization every 6 (six) hours as needed.   insulin glargine 100 UNIT/ML injection Commonly known as: LANTUS Inject 28 Units into the skin every morning.   Praluent 150 MG/ML [place] Generic drug: Alirocumab INJECT 1 PEN UNDER THE SKIN EVERY 14 DAYS        Vitals:   09/08/20 0930 09/08/20 1500  BP:  107/63  Pulse: 73   Resp: 16 18  Temp:  (!) 97.5 F (36.4 C)  SpO2: 100% 98%    Tele box removed and returned. Skin clean, dry and intact without evidence of skin break down, no evidence of skin tears noted. IV catheter discontinued intact. Site without signs and symptoms of complications. Dressing and pressure applied. Pt denies pain at this time. No complaints noted.  An After Visit Summary was printed and given to the patient. Patient escorted via [place], and D/C home via private auto.  Rolley Sims

## 2020-09-08 NOTE — ED Notes (Signed)
Pt up to restroom with walker, ambulatory with steady gait.

## 2020-09-08 NOTE — Progress Notes (Signed)
*  PRELIMINARY RESULTS* Echocardiogram 2D Echocardiogram has been performed.  [name] 09/08/2020, 10:07 AM

## 2020-09-22 NOTE — Progress Notes (Signed)
Remote pacemaker transmission.   

## 2020-09-28 ENCOUNTER — Telehealth: Payer: Self-pay

## 2020-09-28 NOTE — Telephone Encounter (Signed)
Appeal faxed for the praluent

## 2020-10-03 ENCOUNTER — Telehealth: Payer: Self-pay | Admitting: Pharmacist

## 2020-10-03 NOTE — Telephone Encounter (Addendum)
Attempting prior authorization for Praluent through [place]. Pt labs in July were while the was in the hospital and had been off schedule of Praluent. PA submitted using labs from June and PA was approved.   Called pt and made her aware it was approved. She requested refil on Nitro and Ranexa. Ranexa not on med list bc she had stopped taking then resumed. I refilled nitro and she has enough ranexa until she see's Dr. Rockey Situ.

## 2020-10-04 MED ORDER — NITROGLYCERIN 0.4 MG SL SUBL
0.4000 mg | SUBLINGUAL_TABLET | SUBLINGUAL | 3 refills | Status: DC | PRN
Start: 2020-10-04 — End: 2020-10-19

## 2020-10-07 NOTE — Telephone Encounter (Signed)
Incoming fax stating that praluent can not be approved. Coverage is provided in situations where the patient has a documented positive response to therapy with an LDL less than 70 mg/dL

## 2020-10-08 NOTE — Telephone Encounter (Signed)
Based on another phone call the pt had with rph Prentiss Bells it looks that this has been overturned

## 2020-10-18 NOTE — Progress Notes (Signed)
Date:  10/19/2020   ID:  [name], DOB 1945-09-02, MRN [id number]  Patient Location:  [place] [id number]   Provider location:   [facility], [place] office  PCP:  Rusty Aus, MD  Cardiologist:  Arvid Right Scnetx   Chief Complaint  Patient presents with   6 month follow up     Patient c/o chest pain, shortness of breath & LE edema. Medications reviewed by the patient verbally.     History of Present Illness:    [name] is a 75 y.o. female  past medical history of CAD,  cardiac catheterization in 2009 , June 2013, August 2019 moderate LAD, diagonal and RCA disease, Obesity,  diabetes,  hyperlipidemia with statin intolerance, on praluent obstructive sleep apnea on CPAP,   Tachycardia episodes,    leg edema,  lymphedema compression pumps seen in the hospital for severe hypertension and chest pain,  EF 60% in 07/2016 Loop monitor in place for syncope syncope with prolonged bradycardia more than 30 seconds.  There is at least 13 seconds of asystole with occasional scattered PVCs. Pacer, followed by Dr. Caryl Comes 02/2019  recurrent syncope assoc with nausea and vomiting- Back surgeries 2017, 2021, both with postoperative infection who presents for routine followup of her coronary artery disease and diastolic CHF  Last clinic visit jan 2022 Seen in the emergency room with admission July 2022 for angina Stress test performed showing low risk study, Echocardiogram with no acute findings,  It was felt symptoms from GI versus musculoskeletal etiology, unable to exclude stressors  Since d/c, having rare chest pain Better with ranexa,  Sometimes need NTG, had one episode  with b/l jaw pain, better with NTG  On torsemide 20 daily, Some dizziness Feels euvolemic, denies shortness of breath  On praluent  Pacer downloads reviewed,  stable  Labs reviewed with her on today's visit HGBA1C 6.6 HGB 10.9 CR  1.4 Potassium 4.1  EKG personally reviewed by myself on todays visit Shows NSR rate 82 bpm no ST or T wave changes  Other past medical hx reviewed 05/16/2018 left arm pain, etiology unclear Went to the ER, Work up negative  Cath: 10/01/2017 Relatively small LAD with moderate mid vessel disease. Small D1 with 80% ostial/proximal stenosis. Large, dominant LCx without significant disease. Small, non-dominant RCA. Moderately elevated LVEDP. RECOMMENDATIONS: Medical therapy for LAD/diagonal disease.   syncope atrial tachycardia with documented  Pauses.  It was presumed that both events occurred while she was sleeping. --loop monitor placed   Hospitalization for chest pain July 17, 2017 Stress test showing no ischemia, ejection fraction 85% Significant failure to thrive/weakness  Had back surgery 11/14/2015 Went to rehab Got postop infection, ecoli, sepsis  cardiac catheterization 07/25/2011 for chest pain   This showed left dominant coronary system with moderate mid LAD, proximal diagonal #1 and proximal RCA disease all estimated at 50%, normal LV systolic function.   Past Medical History:  Diagnosis Date   Acquired elevated diaphragm    HIGHER ON RIGHT SIDE   Anemia    Anxiety    Aortic atherosclerosis (HCC)    Arthritis    CAD (coronary artery disease)    a. 07/2017 MV: Low risk; b. 09/2017 Cath: LM nl, LAD 50p/m, D1 80ost, LCX large/nl, RCA small/nl.   Chronic back pain    stenosis.degenerative disc,some scoliosis   Chronic heart failure with preserved ejection fraction (HFpEF) ([place])    a. 07/2017  Echo: EF 60-65%, no rwma, gr2 DD, nl RV fxn.   Chronic kidney disease    stage 3   Complication of anesthesia 2018   aspirated during wrist surgery during LMA removal   Constipation    takes Stool Softener daily   Coronary artery disease    Depression    takes Cymbalta daily   Diabetes mellitus    Type 2 diabetic. Average fasting blood sugar runs high 99991111   Diastolic  CHF ([place]) [id number]   Per patient, diagnosed in 2018   Diverticulosis    Dysplastic nevus 09/12/2017   L sup buttock - mild    Dysplastic nevus 09/18/2018   R sup med buttocks - mild    E coli infection    GERD (gastroesophageal reflux disease)    takes Nexium daily   Grade II diastolic dysfunction    Headache    Hemorrhoids    History of colon polyps    benign   History of gout    doesn't take any meds   History of hiatal hernia    small   History of vertigo    doesn't take any meds   Hyperlipidemia    takes Praluent daily   Hypertension    currently BP medications are on hold    Hypothyroidism    takes Synthroid daily   Insomnia    takes Restoril nightly   Muscle spasm    takes Robaxin as needed   NSVT (nonsustained ventricular tachycardia) (HCC)    OSA on CPAP    Paroxysmal atrial tachycardia ([place])    Peripheral vascular disease ([place])    AAA as stated per pt / was just discovered and pt states has not been referred to vascular MD    Presence of permanent cardiac pacemaker    Restless leg    takes Requip daily   Rosacea    Symptomatic bradycardia    a. 02/2019 s/p Biotronik [place] 8 DR-T DC PPM   Syncope    a. Documented bradycardia and pauses-->02/2019 s/p Biotronik [place] 8 DR-T DC PPM.   Varicose veins    Past Surgical History:  Procedure Laterality Date   ABDOMINAL HYSTERECTOMY     with BSo   CARDIAC CATHETERIZATION  2013   Normal   CARPAL TUNNEL RELEASE Bilateral    CATARACT EXTRACTION, BILATERAL     CHOLECYSTECTOMY     COLONOSCOPY     COLONOSCOPY WITH PROPOFOL N/A 11/25/2018   Procedure: COLONOSCOPY WITH PROPOFOL;  Surgeon: Toledo, Benay Pike, MD;  Location: [facility] ENDOSCOPY;  Service: Gastroenterology;  Laterality: N/A;   DIAGNOSTIC LAPAROSCOPY     multiple times   DILATION AND CURETTAGE OF UTERUS     ESOPHAGOGASTRODUODENOSCOPY (EGD) WITH PROPOFOL N/A 11/01/2014   Procedure: ESOPHAGOGASTRODUODENOSCOPY (EGD) WITH PROPOFOL;  Surgeon: Hulen Luster, MD;   Location: [facility] ENDOSCOPY;  Service: Gastroenterology;  Laterality: N/A;   ESOPHAGOGASTRODUODENOSCOPY (EGD) WITH PROPOFOL N/A 11/25/2018   Procedure: ESOPHAGOGASTRODUODENOSCOPY (EGD) WITH PROPOFOL;  Surgeon: Toledo, Benay Pike, MD;  Location: [facility] ENDOSCOPY;  Service: Gastroenterology;  Laterality: N/A;   HARDWARE REMOVAL Left 10/11/2016   Procedure: HARDWARE REMOVAL-LEFT RADIUS;  Surgeon: Lovell Sheehan, MD;  Location: [facility] ORS;  Service: Orthopedics;  Laterality: Left;  Left Radius Wrist    IMPLANTABLE CONTACT LENS IMPLANTATION     bilateral   KNEE ARTHROSCOPY Right 05/09/2020   Procedure: ARTHROSCOPY KNEE;  Surgeon: Dereck Leep, MD;  Location: [facility] ORS;  Service: Orthopedics;  Laterality: Right;   LAMINECTOMY  11/13/2015  LEFT HEART CATH AND CORONARY ANGIOGRAPHY Left 10/01/2017   Procedure: LEFT HEART CATH AND CORONARY ANGIOGRAPHY;  Surgeon: Nelva Bush, MD;  Location: [place] [facility];  Service: Cardiovascular;  Laterality: Left;   LOOP RECORDER INSERTION N/A 10/17/2017   Procedure: LOOP RECORDER INSERTION;  Surgeon: Deboraha Sprang, MD;  Location: [place] [facility];  Service: Cardiovascular;  Laterality: N/A;   LOOP RECORDER REMOVAL N/A 03/04/2019   Procedure: LOOP RECORDER REMOVAL;  Surgeon: Deboraha Sprang, MD;  Location: [place] [facility];  Service: Cardiovascular;  Laterality: N/A;   LUMBAR FUSION  11/2015   LUMBAR WOUND DEBRIDEMENT N/A 12/02/2015   Procedure: WOUND Exploration;  Surgeon: Consuella Lose, MD;  Location: [place];  Service: Neurosurgery;  Laterality: N/A;   OPEN REDUCTION INTERNAL FIXATION (ORIF) DISTAL RADIAL FRACTURE Left 08/29/2016   Procedure: OPEN REDUCTION INTERNAL FIXATION (ORIF) DISTAL RADIAL FRACTURE;  Surgeon: Lovell Sheehan, MD;  Location: [facility] OR[facility];  Service: Orthopedics;  Laterality: Left;   PACEMAKER IMPLANT N/A 03/04/2019   Procedure: PACEMAKER IMPLANT;  Surgeon: Deboraha Sprang, MD;  Location: [place] [facility];  Service: Cardiovascular;   Laterality: N/A;   PICC LINE PLACE PERIPHERAL ([place] HX)     right upper arm    ROTATOR CUFF REPAIR Right    SAVORY DILATION N/A 11/01/2014   Procedure: SAVORY DILATION;  Surgeon: Hulen Luster, MD;  Location: [facility]OSCOPY;  Service: Gastroenterology;  Laterality: N/A;   TONSILLECTOMY     TRIGGER FINGER RELEASE Bilateral      Current Meds  Medication Sig   Acetaminophen (TYLENOL ARTHRITIS PAIN PO) Take 1,300 mg by mouth every 8 (eight) hours as needed (pain).   aspirin 81 MG tablet Take 81 mg by mouth at bedtime.    atenolol (TENORMIN) 25 MG tablet Take 0.5 tablets (12.5 mg total) by mouth daily. (Patient taking differently: Take 12.5 mg by mouth every morning.)   Azelaic Acid (FINACEA) 15 % FOAM Apply 1 application topically as directed. Qd to bid to aa face   azelastine (ASTELIN) 0.1 % nasal spray Place 2 sprays into both nostrils 2 (two) times daily as needed for rhinitis.   cholecalciferol (VITAMIN D3) 25 MCG (1000 UNIT) tablet Take 1,000 Units by mouth daily.   diclofenac Sodium (VOLTAREN) 1 % GEL Apply 2 g topically 2 (two) times daily.   esomeprazole (NEXIUM) 40 MG capsule Take 40 mg by mouth 2 (two) times daily.   gabapentin (NEURONTIN) 300 MG capsule Take 300 mg by mouth 2 (two) times daily.   HYDROcodone-acetaminophen (NORCO) 5-325 MG tablet Take 1-2 tablets by mouth every 4 (four) hours as needed for moderate pain.   Insulin Aspart (NOVOLOG FLEXPEN [place]) Inject 10 Units into the skin as needed.   insulin glargine (LANTUS) 100 UNIT/ML injection Inject 28 Units into the skin every morning.   LANTUS SOLOSTAR 100 UNIT/ML Solostar Pen Inject 28 Units into the skin every morning.   levothyroxine (SYNTHROID, LEVOTHROID) 75 MCG tablet Take 75 mcg by mouth daily before breakfast.    linagliptin (TRADJENTA) 5 MG TABS tablet Take 5 mg by mouth daily.   methocarbamol (ROBAXIN) 500 MG tablet Take 500 mg by mouth every 8 (eight) hours as needed for muscle spasms.   mometasone (ELOCON) 0.1 % cream  Apply 1 application topically daily as needed (Rash). Qd to bid up to 5 days a week aa right lower leg prn itching   naproxen sodium (ALEVE) 220 MG tablet Take 220 mg by mouth every other  day.   nitroGLYCERIN (NITROSTAT) 0.4 MG SL tablet Place 1 tablet (0.4 mg total) under the tongue every 5 (five) minutes as needed for chest pain.   ondansetron (ZOFRAN) 4 MG tablet Take 4 mg by mouth every 8 (eight) hours as needed for nausea or vomiting.    potassium chloride SA (KLOR-CON) 20 MEQ tablet Take 20-40 mEq by mouth See admin instructions. Take 40 mEq by mouth every other day, alternating with 20 mEq on alternate days.   PRALUENT 150 MG/ML SOAJ INJECT 1 PEN UNDER THE SKIN EVERY 14 DAYS (Patient taking differently: Inject 150 mg into the skin every 14 (fourteen) days.)   ranolazine (RANEXA) 500 MG 12 hr tablet Take 500 mg by mouth 2 (two) times daily.   ropinirole (REQUIP) 5 MG tablet Take 5 mg by mouth at bedtime.   temazepam (RESTORIL) 30 MG capsule Take 30 mg by mouth at bedtime.   torsemide (DEMADEX) 20 MG tablet Take 20-40 mg by mouth See admin instructions. Take 40 mg by mouth every other day, alternating with 20 mg on alternate days   traMADol (ULTRAM) 50 MG tablet Take 50 mg by mouth every 6 (six) hours as needed for moderate pain or severe pain.   trolamine salicylate (ASPERCREME) 10 % cream Apply 1 application topically at bedtime.     Allergies:   Ezetimibe, Metoclopramide, Propoxyphene, Hydrocodone, Tramadol, Ceftin [cefuroxime axetil], Codeine, Penicillins, Statins, and Vicodin [hydrocodone-acetaminophen]   Social History   Tobacco Use   Smoking status: Never   Smokeless tobacco: Never  Vaping Use   Vaping Use: Never used  Substance Use Topics   Alcohol use: No   Drug use: No     Family Hx: The patient's family history includes Breast cancer (age of onset: 36) in her mother; Heart attack in her sister; Heart disease in her brother, father, mother, and sister.  ROS:   Please  see the history of present illness.    Review of Systems  Constitutional: Negative.   HENT: Negative.    Respiratory: Negative.    Cardiovascular: Negative.   Gastrointestinal: Negative.   Musculoskeletal:  Positive for back pain.  Neurological: Negative.   Psychiatric/Behavioral: Negative.    All other systems reviewed and are negative.   Labs/Other Tests and Data Reviewed:    Recent Labs: 09/07/2020: ALT 17; B Natriuretic Peptide 54.6; BUN 29; Creatinine, Ser 1.42; Hemoglobin 12.3; Platelets 207; Potassium 4.2; Sodium 137   Recent Lipid Panel Lab Results  Component Value Date/Time   CHOL 163 09/08/2020 06:13 AM   CHOL 163 07/26/2015 08:02 AM   TRIG 192 (H) 09/08/2020 06:13 AM   HDL 44 09/08/2020 06:13 AM   HDL 32 (L) 07/26/2015 08:02 AM   CHOLHDL 3.7 09/08/2020 06:13 AM   LDLCALC 81 09/08/2020 06:13 AM   LDLCALC 94 07/26/2015 08:02 AM    Wt Readings from Last 3 Encounters:  10/19/20 213 lb 8 oz (96.8 kg)  09/07/20 209 lb (94.8 kg)  07/21/20 203 lb (92.1 kg)     Exam:    Vital Signs: Vital signs may also be detailed in the HPI BP 132/60 (BP Location: Left Arm, Patient Position: Sitting, Cuff Size: Normal)   Pulse 82   Ht '5\' 2"'$  (1.575 m)   Wt 213 lb 8 oz (96.8 kg)   SpO2 99%   BMI 39.05 kg/m    Constitutional:  oriented to person, place, and time. No distress.  HENT:  Head: Grossly normal Eyes:  no discharge. No scleral icterus.  Neck: No JVD, no carotid bruits  Cardiovascular: Regular rate and rhythm, no murmurs appreciated Pulmonary/Chest: Clear to auscultation bilaterally, no wheezes or rails Abdominal: Soft.  no distension.  no tenderness.  Musculoskeletal: Normal range of motion Neurological:  normal muscle tone. Coordination normal. No atrophy Skin: Skin warm and dry Psychiatric: normal affect, pleasant   ASSESSMENT & PLAN:    Atherosclerosis of native coronary artery with stable angina pectoris, unspecified whether native or transplanted heart  (HCC) Rare angina Recent echo and stress test Increase ranexa to 1000 BID Add imdur 15 to 30 daily as BP tolerates  Type 2 diabetes mellitus with stage 3 chronic kidney disease, with long-term current use of insulin ([place]) We have encouraged continued exercise, careful diet management in an effort to lose weight. Limited by back pain  Atrial tachycardia ([place]) Followed by Dr. Therisa Doyne downloads reviewed On atenolol  Chronic diastolic heart failure ([place]) Appears euvolemic on torsemide 20 daily,   Sinus node dysfunction (HCC) Followed by Dr. Caryl Comes Has pacer  Lymphedema Stable sx Previously required compression pumps,  stable on torsemide, weight loss  Essential hypertension Blood pressure is well controlled on today's visit. No changes made to the medications.  OSA on CPAP Continues to use her CPAP  Syncope Pacemaker in place Previous episodes in the setting of nausea vomiting, vasovagal No recent sx  CRI Discussed, avoid NSAIDs fro back    Total encounter time more than 25 minutes  Greater than 50% was spent in counseling and coordination of care with the patient    Signed, Ida Rogue, MD  10/19/2020 10:19 AM    [place] Office [address], [place], [place] [address]

## 2020-10-19 ENCOUNTER — Telehealth: Payer: Self-pay | Admitting: Cardiovascular Disease

## 2020-10-19 ENCOUNTER — Other Ambulatory Visit: Payer: Self-pay

## 2020-10-19 ENCOUNTER — Encounter: Payer: Self-pay | Admitting: Cardiovascular Disease

## 2020-10-19 ENCOUNTER — Ambulatory Visit (INDEPENDENT_AMBULATORY_CARE_PROVIDER_SITE_OTHER): Payer: Medicare Other | Admitting: Cardiovascular Disease

## 2020-10-19 VITALS — BP 132/60 | HR 82 | Ht 62.0 in | Wt 213.5 lb

## 2020-10-19 DIAGNOSIS — I471 Supraventricular tachycardia: Secondary | ICD-10-CM | POA: Diagnosis not present

## 2020-10-19 DIAGNOSIS — I25118 Atherosclerotic heart disease of native coronary artery with other forms of angina pectoris: Secondary | ICD-10-CM

## 2020-10-19 DIAGNOSIS — I200 Unstable angina: Secondary | ICD-10-CM | POA: Diagnosis not present

## 2020-10-19 MED ORDER — POTASSIUM CHLORIDE CRYS ER 20 MEQ PO TBCR: 20.0000 meq | EXTENDED_RELEASE_TABLET | ORAL | 3 refills | Status: DC

## 2020-10-19 MED ORDER — ATENOLOL 25 MG PO TABS: 12.5000 mg | ORAL_TABLET | ORAL | 3 refills | Status: DC

## 2020-10-19 MED ORDER — TORSEMIDE 20 MG PO TABS: 20.0000 mg | ORAL_TABLET | ORAL | 3 refills | Status: DC

## 2020-10-19 MED ORDER — RANOLAZINE ER 500 MG PO TB12: 1000.0000 mg | ORAL_TABLET | Freq: Two times a day (BID) | ORAL | 3 refills | Status: DC

## 2020-10-19 MED ORDER — ISOSORBIDE MONONITRATE ER 30 MG PO TB24: 30.0000 mg | ORAL_TABLET | Freq: Every day | ORAL | 3 refills | Status: DC

## 2020-10-19 MED ORDER — NITROGLYCERIN 0.4 MG SL SUBL
0.4000 mg | SUBLINGUAL_TABLET | SUBLINGUAL | 3 refills | Status: DC | PRN
Start: 2020-10-19 — End: 2021-07-13

## 2020-10-19 NOTE — Telephone Encounter (Signed)
Patient calling needs all medications ordered today sent to wrong pharmacy the all need to go to [facility]

## 2020-10-19 NOTE — Patient Instructions (Addendum)
Medication Instructions:   Please START Imdur/isosorbide 30 mg daily  Increase Ranexa 1000 mg twice a day  If you need a refill on your cardiac medications before your next appointment, please call your pharmacy.   Lab work: No new labs needed  Testing/Procedures: No new testing needed  Follow-Up: At [facility], you and your health needs are our priority.  As part of our continuing mission to provide you with exceptional heart care, we have created designated Provider Care Teams.  These Care Teams include your primary Cardiologist (physician) and Advanced Practice Providers (APPs -  Physician Assistants and Nurse Practitioners) who all work together to provide you with the care you need, when you need it.  You will need a follow up appointment in 6 months  Providers on your designated Care Team:   Murray Hodgkins, NP Christell Faith, PA-C Marrianne Mood, PA-C Cadence Alicia, [place]  COVID-19 Vaccine Information can be found at: [email or web address] For questions related to vaccine distribution or appointments, please email [email or web address] or call [phone].

## 2020-10-19 NOTE — Telephone Encounter (Signed)
Requested Prescriptions   Signed Prescriptions Disp Refills   atenolol (TENORMIN) 25 MG tablet 45 tablet 3    Sig: Take 0.5 tablets (12.5 mg total) by mouth every morning.    Authorizing Provider: Minna Merritts    Ordering User: Othelia Pulling C   isosorbide mononitrate (IMDUR) 30 MG 24 hr tablet 90 tablet 3    Sig: Take 1 tablet (30 mg total) by mouth daily.    Authorizing Provider: Minna Merritts    Ordering User: Othelia Pulling C   potassium chloride SA (KLOR-CON) 20 MEQ tablet 120 tablet 3    Sig: Take 1-2 tablets (20-40 mEq total) by mouth See admin instructions. Take 40 mEq by mouth every other day, alternating with 20 mEq on alternate days.    Authorizing Provider: Minna Merritts    Ordering User: Othelia Pulling C   ranolazine (RANEXA) 500 MG 12 hr tablet 360 tablet 3    Sig: Take 2 tablets (1,000 mg total) by mouth 2 (two) times daily.    Authorizing Provider: Minna Merritts    Ordering User: Othelia Pulling C   torsemide (DEMADEX) 20 MG tablet 120 tablet 3    Sig: Take 1-2 tablets (20-40 mg total) by mouth See admin instructions. Take 40 mg by mouth every other day, alternating with 20 mg on alternate days    Authorizing Provider: Minna Merritts    Ordering User: Britt Bottom

## 2020-11-24 DIAGNOSIS — E538 Deficiency of other specified B group vitamins: Secondary | ICD-10-CM | POA: Insufficient documentation

## 2020-11-30 ENCOUNTER — Ambulatory Visit (INDEPENDENT_AMBULATORY_CARE_PROVIDER_SITE_OTHER): Payer: Medicare Other

## 2020-11-30 DIAGNOSIS — I471 Supraventricular tachycardia: Secondary | ICD-10-CM

## 2020-12-02 LAB — CUP PACEART REMOTE DEVICE CHECK
Date Time Interrogation Session: 20221019091908
Implantable Lead Implant Date: 20210120
Implantable Lead Implant Date: 20210120
Implantable Lead Location: 753859
Implantable Lead Location: 753860
Implantable Lead Model: 5076
Implantable Lead Model: 5076
Implantable Pulse Generator Implant Date: 20210120
Pulse Gen Model: 407145
Pulse Gen Serial Number: 69765687

## 2020-12-04 IMAGING — MG DIGITAL SCREENING BILATERAL MAMMOGRAM WITH TOMO AND CAD
6 of 10 series · 6 of 30 positions shown · non-contrast
Comparison: Previous exam(s).

CLINICAL DATA: Screening.

EXAM:
DIGITAL SCREENING BILATERAL MAMMOGRAM WITH TOMO AND CAD

[L MLO synth-2D (1 of 2)]
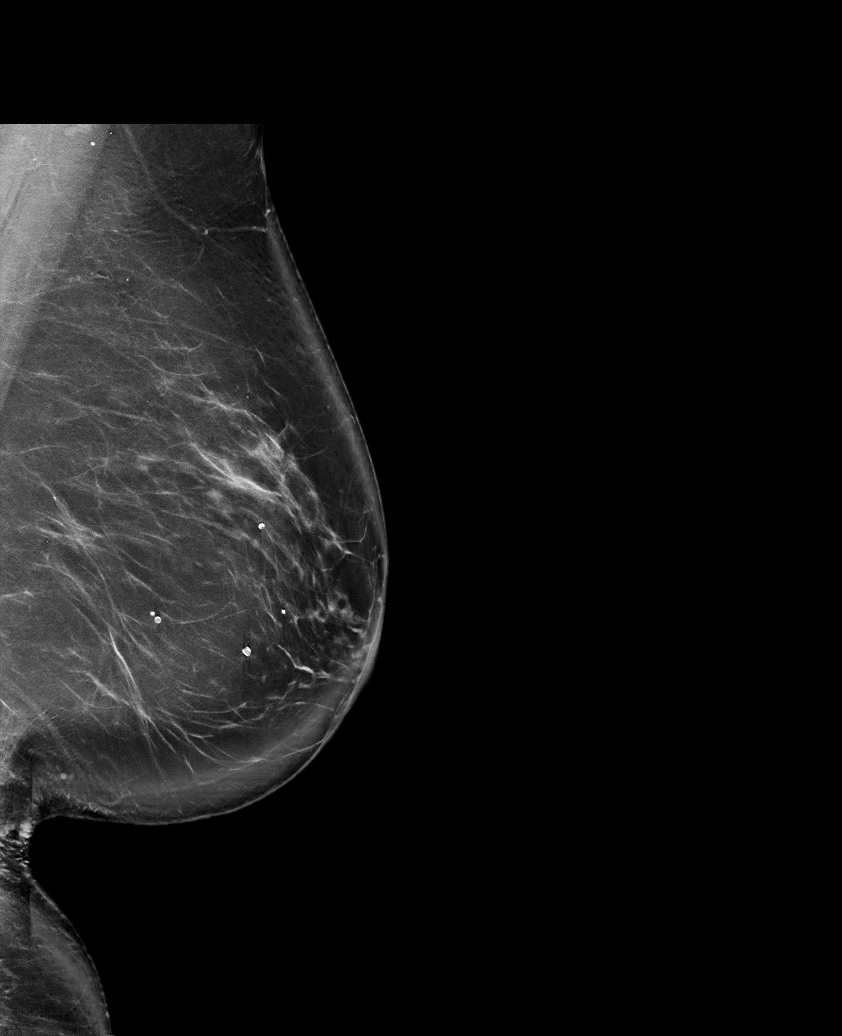

[R CC synth-2D]
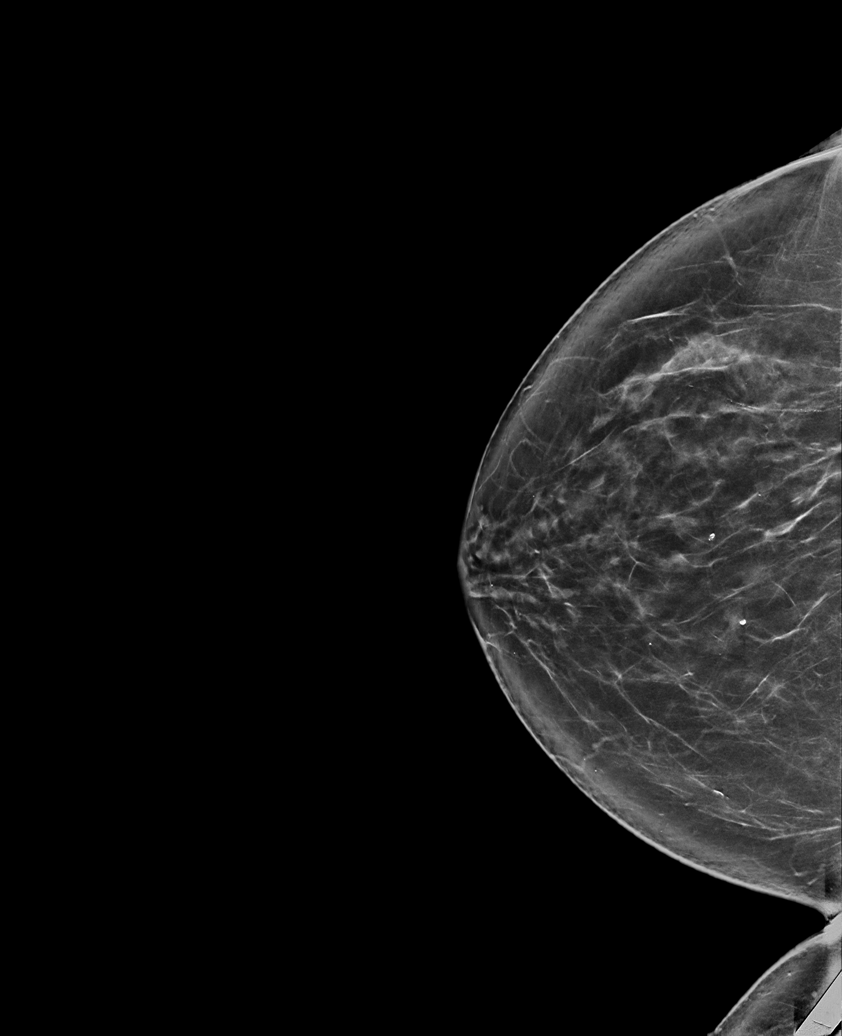

[L CC synth-2D]
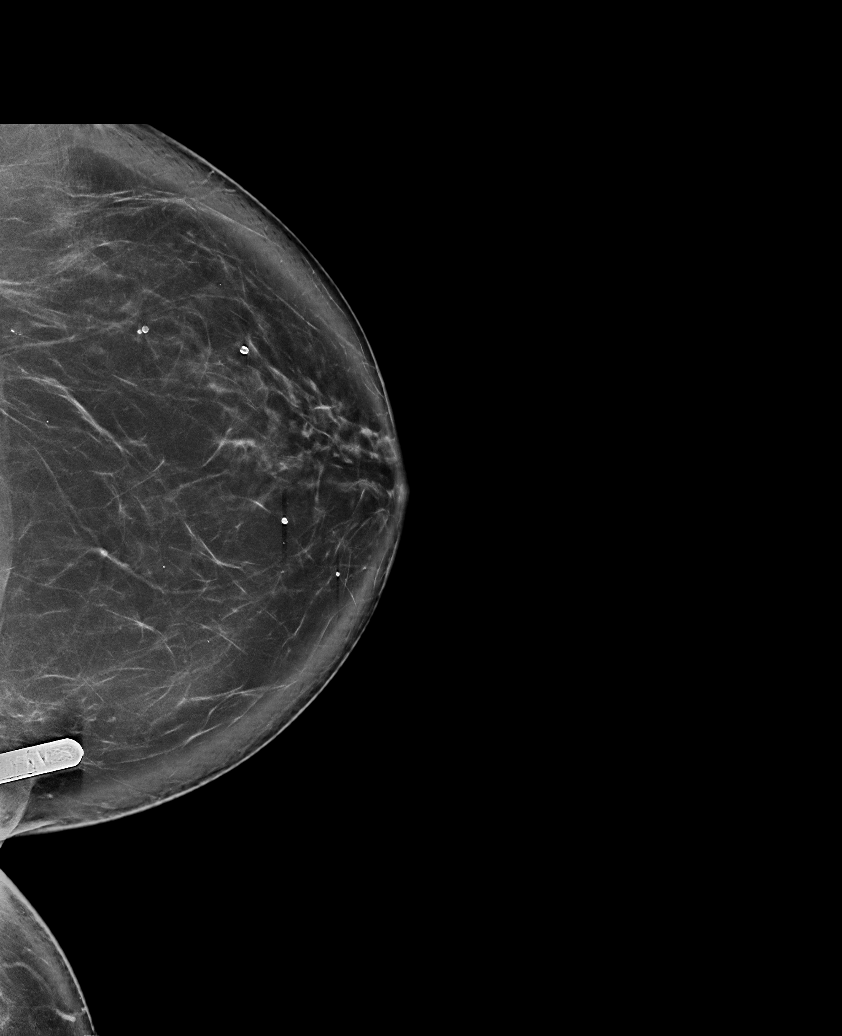

[L MLO synth-2D (2 of 2)]
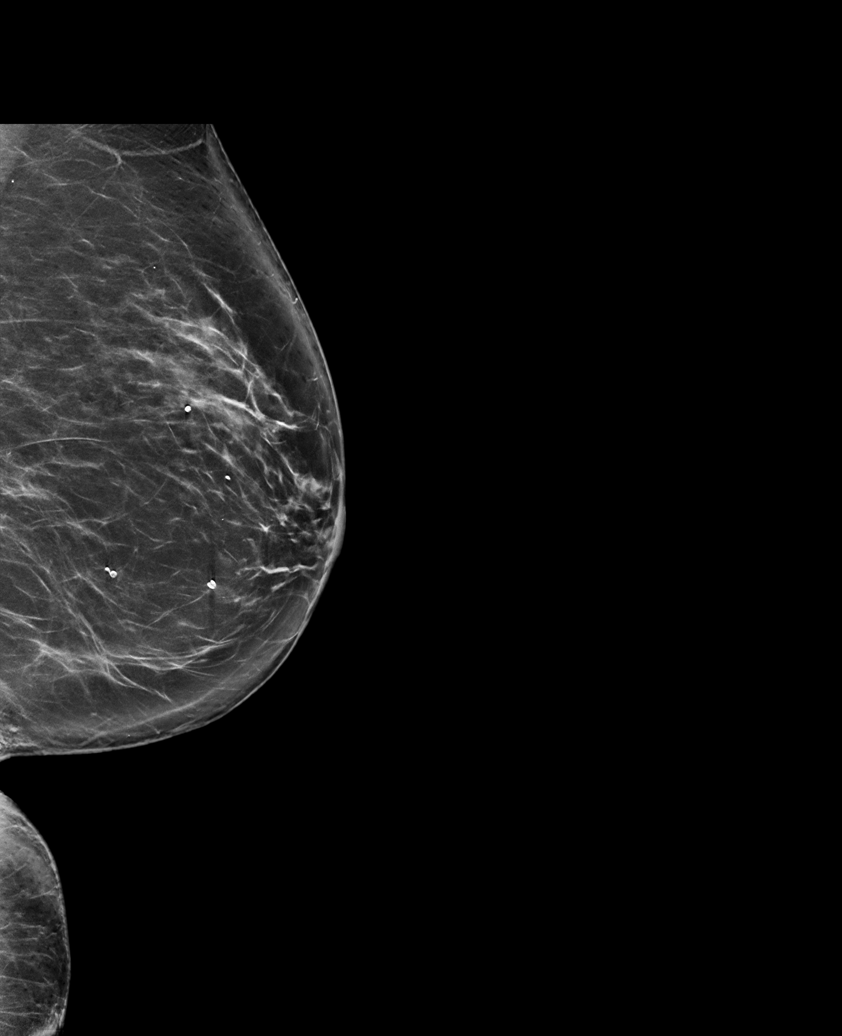

[R MLO synth-2D]
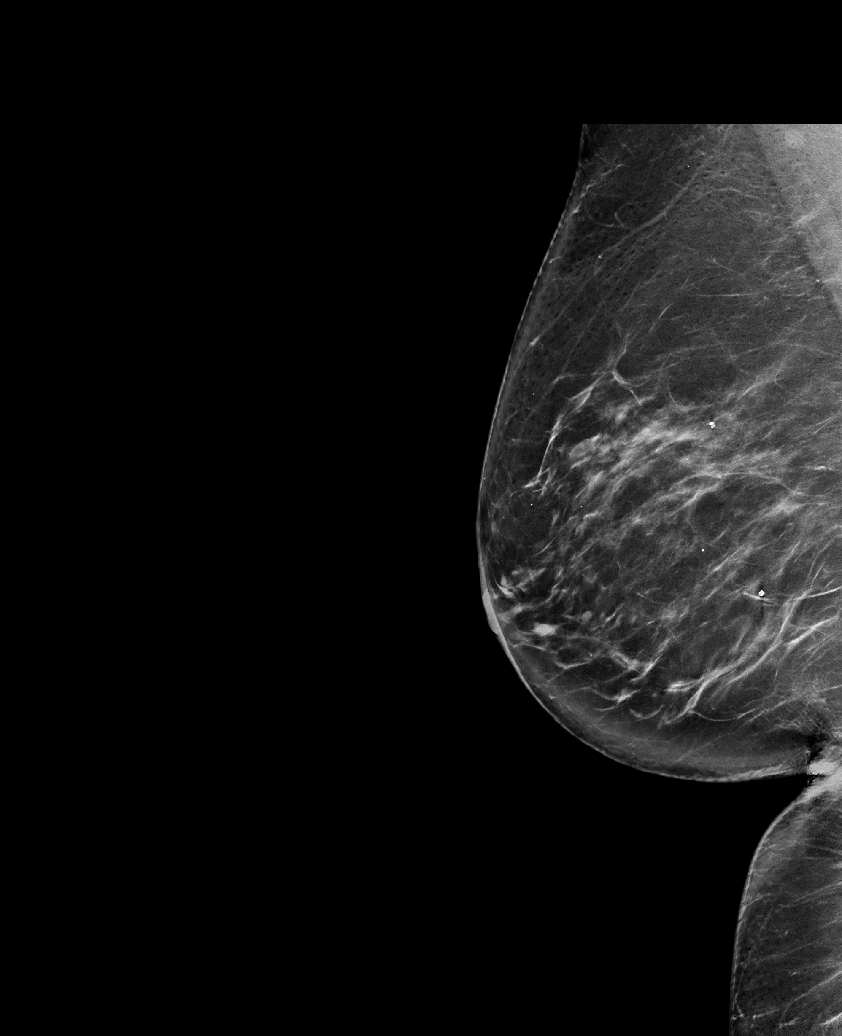

[L MLO tomo · tomo slice 57/113.0]
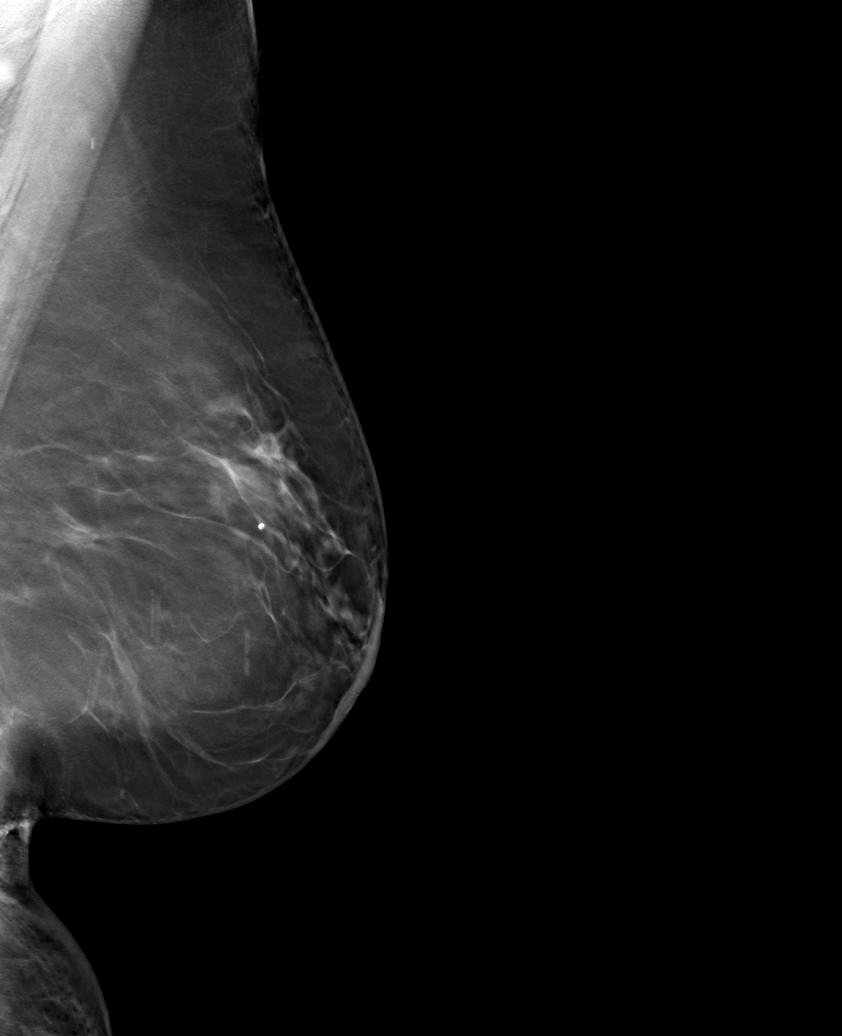

[6 of 30 positions shown; findings below may reference images not displayed]

ACR Breast Density Category b: There are scattered areas of
fibroglandular density.
FINDINGS: There are no findings suspicious for malignancy. Images were
processed with CAD.
IMPRESSION: No mammographic evidence of malignancy. A result letter of this
screening mammogram will be mailed directly to the patient.

RECOMMENDATION:
Screening mammogram in one year. (Code:[id number])

BI-RADS CATEGORY  1: Negative.

## 2020-12-05 NOTE — Progress Notes (Signed)
Remote pacemaker transmission.   

## 2021-02-17 ENCOUNTER — Other Ambulatory Visit: Payer: Self-pay | Admitting: Cardiovascular Disease

## 2021-03-01 ENCOUNTER — Ambulatory Visit (INDEPENDENT_AMBULATORY_CARE_PROVIDER_SITE_OTHER): Payer: Medicare Other

## 2021-03-01 DIAGNOSIS — I5032 Chronic diastolic (congestive) heart failure: Secondary | ICD-10-CM | POA: Diagnosis not present

## 2021-03-01 LAB — CUP PACEART REMOTE DEVICE CHECK
Date Time Interrogation Session: 20230118095303
Implantable Lead Implant Date: 20210120
Implantable Lead Implant Date: 20210120
Implantable Lead Location: 753859
Implantable Lead Location: 753860
Implantable Lead Model: 5076
Implantable Lead Model: 5076
Implantable Pulse Generator Implant Date: 20210120
Pulse Gen Model: 407145
Pulse Gen Serial Number: 69765687

## 2021-03-14 NOTE — Progress Notes (Signed)
Remote pacemaker transmission.   

## 2021-03-22 ENCOUNTER — Other Ambulatory Visit: Payer: Self-pay | Admitting: Internal Medicine

## 2021-03-22 DIAGNOSIS — Z1231 Encounter for screening mammogram for malignant neoplasm of breast: Secondary | ICD-10-CM

## 2021-04-02 LAB — PACEMAKER DEVICE OBSERVATION

## 2021-04-04 ENCOUNTER — Other Ambulatory Visit: Payer: Self-pay

## 2021-04-04 ENCOUNTER — Encounter: Payer: Self-pay | Admitting: Internal Medicine

## 2021-04-04 ENCOUNTER — Ambulatory Visit (INDEPENDENT_AMBULATORY_CARE_PROVIDER_SITE_OTHER): Payer: Medicare Other | Admitting: Internal Medicine

## 2021-04-04 VITALS — BP 120/70 | HR 89 | Ht 61.0 in | Wt 215.0 lb

## 2021-04-04 DIAGNOSIS — I5032 Chronic diastolic (congestive) heart failure: Secondary | ICD-10-CM

## 2021-04-04 DIAGNOSIS — R55 Syncope and collapse: Secondary | ICD-10-CM | POA: Diagnosis not present

## 2021-04-04 DIAGNOSIS — I4719 Other supraventricular tachycardia: Secondary | ICD-10-CM

## 2021-04-04 DIAGNOSIS — Z79899 Other long term (current) drug therapy: Secondary | ICD-10-CM

## 2021-04-04 DIAGNOSIS — Z950 Presence of cardiac pacemaker: Secondary | ICD-10-CM | POA: Diagnosis not present

## 2021-04-04 DIAGNOSIS — I455 Other specified heart block: Secondary | ICD-10-CM | POA: Diagnosis not present

## 2021-04-04 DIAGNOSIS — I471 Supraventricular tachycardia: Secondary | ICD-10-CM

## 2021-04-04 DIAGNOSIS — I10 Essential (primary) hypertension: Secondary | ICD-10-CM

## 2021-04-04 MED ORDER — TORSEMIDE 20 MG PO TABS: ORAL_TABLET | ORAL | 3 refills | Status: DC

## 2021-04-04 NOTE — Patient Instructions (Addendum)
Medication Instructions:  - Your physician has recommended you make the following change in your medication:   1) INCREASE torsemide 20 mg: - take 2 tablets (40 mg) by mouth once daily   *If you need a refill on your cardiac medications before your next appointment, please call your pharmacy*   Lab Work: - Your physician recommends that you return for lab work in: 3 weeks- BMP   If you have labs (blood work) drawn today and your tests are completely normal, you will receive your results only by: [place] (if you have MyChart) OR A paper copy in the mail If you have any lab test that is abnormal or we need to change your treatment, we will call you to review the results.   Testing/Procedures: - none ordered   Follow-Up: At [facility], you and your health needs are our priority.  As part of our continuing mission to provide you with exceptional heart care, we have created designated Provider Care Teams.  These Care Teams include your primary Cardiologist (physician) and Advanced Practice Providers (APPs -  Physician Assistants and Nurse Practitioners) who all work together to provide you with the care you need, when you need it.  We recommend signing up for the patient portal called "MyChart".  Sign up information is provided on this After Visit Summary.  MyChart is used to connect with patients for Virtual Visits (Telemedicine).  Patients are able to view lab/test results, encounter notes, upcoming appointments, etc.  Non-urgent messages can be sent to your provider as well.   To learn more about what you can do with MyChart, go to [email or web address].    Your next appointment:   1 year(s)  The format for your next appointment:   In Person  Provider:   Virl Axe, MD    Other Instructions N/a

## 2021-04-04 NOTE — Progress Notes (Signed)
Patient Care Team: Rusty Aus, MD as PCP - General (Internal Medicine) Minna Merritts, MD as PCP - Cardiology (Cardiology) Deboraha Sprang, MD as PCP - Electrophysiology (Clinical Cardiac Electrophysiology) Minna Merritts, MD as Consulting Physician (Cardiology)   HPI  [name] is a 76 y.o. female Seen in follow-up for syncope atrial tachycardia with documented pauses and hx of syncope>> Underwent loop recorder insertion for recurrent syncope 9/19 seen 1/21 for interval syncope demonstrating profound bradycardia more than 30 seconds with 13 seconds of asystole.  She underwent Biotronik pacemaker implantation 1/21.  Having undergone pacemaker implant 1/21 she underwent back surgery 0/08 that was complicated by infection requiring a repeat procedure drainage of an abscess and debridement.  Long-term antibiotics.  No evidence of device infection.  She also has a history of exertional chest discomfort for which she underwent CT A FINDINGS of significant coronary disease OM FFR was concerning and LAD FFR was not possible and could not rule out severe stenosis>> LHC See Below     Now taking isosorbide and ranolazine and atenolol Has not tolerated statins in the past is on praluent   Dyspnea on exertion.  Not significant chest pain.  Some peripheral edema.  Trying to lose weight.  Exercises been challenging because of back pain and knee pain.  Has an injection scheduled.  Mostly chair bound.  Recent presyncope.  On her way to the bathroom.  Sat on the commode and symptoms resolved.  Reminiscent of prior episodes.  Previously has tried SGLT2 but struggled with intertriginous area infection    DATE TEST EF   6/19 Echo   55-65 % PA pressure normal  7/19  CTA  FFR < .8 OM and not able to calculate LAD  8/19 LHC  50 %LAD mid/80%D1  7/22 MYOVIEW  No ischemia  7/22 Echo  60-65%      Date Cr K Hgb LDL  1/21 1.14 3.8 12.2 (4/21)   1/22  1.3 4.1 11.0   7/22 1.42 4.2 12.3  81      Past Medical History:  Diagnosis Date   Acquired elevated diaphragm    HIGHER ON RIGHT SIDE   Anemia    Anxiety    Aortic atherosclerosis (HCC)    Arthritis    CAD (coronary artery disease)    a. 07/2017 MV: Low risk; b. 09/2017 Cath: LM nl, LAD 50p/m, D1 80ost, LCX large/nl, RCA small/nl.   Chronic back pain    stenosis.degenerative disc,some scoliosis   Chronic heart failure with preserved ejection fraction (HFpEF) ([place])    a. 07/2017 Echo: EF 60-65%, no rwma, gr2 DD, nl RV fxn.   Chronic kidney disease    stage 3   Complication of anesthesia 2018   aspirated during wrist surgery during LMA removal   Constipation    takes Stool Softener daily   Coronary artery disease    Depression    takes Cymbalta daily   Diabetes mellitus    Type 2 diabetic. Average fasting blood sugar runs high 676-195   Diastolic CHF ([place]) 09/32/6712   Per patient, diagnosed in 2018   Diverticulosis    Dysplastic nevus 09/12/2017   L sup buttock - mild    Dysplastic nevus 09/18/2018   R sup med buttocks - mild    E coli infection    GERD (gastroesophageal reflux disease)    takes Nexium daily   Grade II diastolic dysfunction    Headache  Hemorrhoids    History of colon polyps    benign   History of gout    doesn't take any meds   History of hiatal hernia    small   History of vertigo    doesn't take any meds   Hyperlipidemia    takes Praluent daily   Hypertension    currently BP medications are on hold    Hypothyroidism    takes Synthroid daily   Insomnia    takes Restoril nightly   Muscle spasm    takes Robaxin as needed   NSVT (nonsustained ventricular tachycardia)    OSA on CPAP    Paroxysmal atrial tachycardia ([place])    Peripheral vascular disease ([place])    AAA as stated per pt / was just discovered and pt states has not been referred to vascular MD    Presence of permanent cardiac pacemaker    Restless leg    takes Requip daily   Rosacea    Symptomatic bradycardia     a. 02/2019 s/p Biotronik [place] 8 DR-T DC PPM   Syncope    a. Documented bradycardia and pauses-->02/2019 s/p Biotronik [place] 8 DR-T DC PPM.   Varicose veins     Past Surgical History:  Procedure Laterality Date   ABDOMINAL HYSTERECTOMY     with BSo   CARDIAC CATHETERIZATION  2013   Normal   CARPAL TUNNEL RELEASE Bilateral    CATARACT EXTRACTION, BILATERAL     CHOLECYSTECTOMY     COLONOSCOPY     COLONOSCOPY WITH PROPOFOL N/A 11/25/2018   Procedure: COLONOSCOPY WITH PROPOFOL;  Surgeon: Toledo, Benay Pike, MD;  Location: [facility] ENDOSCOPY;  Service: Gastroenterology;  Laterality: N/A;   DIAGNOSTIC LAPAROSCOPY     multiple times   DILATION AND CURETTAGE OF UTERUS     ESOPHAGOGASTRODUODENOSCOPY (EGD) WITH PROPOFOL N/A 11/01/2014   Procedure: ESOPHAGOGASTRODUODENOSCOPY (EGD) WITH PROPOFOL;  Surgeon: Hulen Luster, MD;  Location: [facility] ENDOSCOPY;  Service: Gastroenterology;  Laterality: N/A;   ESOPHAGOGASTRODUODENOSCOPY (EGD) WITH PROPOFOL N/A 11/25/2018   Procedure: ESOPHAGOGASTRODUODENOSCOPY (EGD) WITH PROPOFOL;  Surgeon: Toledo, Benay Pike, MD;  Location: [facility] ENDOSCOPY;  Service: Gastroenterology;  Laterality: N/A;   HARDWARE REMOVAL Left 10/11/2016   Procedure: HARDWARE REMOVAL-LEFT RADIUS;  Surgeon: Lovell Sheehan, MD;  Location: [facility] ORS;  Service: Orthopedics;  Laterality: Left;  Left Radius Wrist    IMPLANTABLE CONTACT LENS IMPLANTATION     bilateral   KNEE ARTHROSCOPY Right 05/09/2020   Procedure: ARTHROSCOPY KNEE;  Surgeon: Dereck Leep, MD;  Location: [facility] ORS;  Service: Orthopedics;  Laterality: Right;   LAMINECTOMY  11/13/2015   LEFT HEART CATH AND CORONARY ANGIOGRAPHY Left 10/01/2017   Procedure: LEFT HEART CATH AND CORONARY ANGIOGRAPHY;  Surgeon: Nelva Bush, MD;  Location: [place] CV LAB;  Service: Cardiovascular;  Laterality: Left;   LOOP RECORDER INSERTION N/A 10/17/2017   Procedure: LOOP RECORDER INSERTION;  Surgeon: Deboraha Sprang, MD;  Location: [place] CV  LAB;  Service: Cardiovascular;  Laterality: N/A;   LOOP RECORDER REMOVAL N/A 03/04/2019   Procedure: LOOP RECORDER REMOVAL;  Surgeon: Deboraha Sprang, MD;  Location: [place] CV LAB;  Service: Cardiovascular;  Laterality: N/A;   LUMBAR FUSION  11/2015   LUMBAR WOUND DEBRIDEMENT N/A 12/02/2015   Procedure: WOUND Exploration;  Surgeon: Consuella Lose, MD;  Location: [place];  Service: Neurosurgery;  Laterality: N/A;   OPEN REDUCTION INTERNAL FIXATION (ORIF) DISTAL RADIAL FRACTURE Left 08/29/2016   Procedure: OPEN REDUCTION INTERNAL FIXATION (ORIF) DISTAL  RADIAL FRACTURE;  Surgeon: Lovell Sheehan, MD;  Location: [facility] ORS;  Service: Orthopedics;  Laterality: Left;   PACEMAKER IMPLANT N/A 03/04/2019   Procedure: PACEMAKER IMPLANT;  Surgeon: Deboraha Sprang, MD;  Location: [place] CV LAB;  Service: Cardiovascular;  Laterality: N/A;   PICC LINE PLACE PERIPHERAL ([place] HX)     right upper arm    ROTATOR CUFF REPAIR Right    SAVORY DILATION N/A 11/01/2014   Procedure: SAVORY DILATION;  Surgeon: Hulen Luster, MD;  Location: [facility] ENDOSCOPY;  Service: Gastroenterology;  Laterality: N/A;   TONSILLECTOMY     TRIGGER FINGER RELEASE Bilateral     Current Meds  Medication Sig   Acetaminophen (TYLENOL ARTHRITIS PAIN PO) Take 1,300 mg by mouth every 8 (eight) hours as needed (pain).   albuterol (PROVENTIL HFA;VENTOLIN HFA) 108 (90 Base) MCG/ACT inhaler Inhale 2 puffs into the lungs every 6 (six) hours as needed for wheezing or shortness of breath.   Alirocumab (PRALUENT) 150 MG/ML SOAJ Inject 150 mg into the skin every 14 (fourteen) days.   aspirin 81 MG tablet Take 81 mg by mouth at bedtime.    atenolol (TENORMIN) 25 MG tablet Take 0.5 tablets (12.5 mg total) by mouth every morning.   Azelaic Acid (FINACEA) 15 % FOAM Apply 1 application topically as directed. Qd to bid to aa face   azelastine (ASTELIN) 0.1 % nasal spray Place 2 sprays into both nostrils 2 (two) times daily as needed for rhinitis.    cholecalciferol (VITAMIN D3) 25 MCG (1000 UNIT) tablet Take 1,000 Units by mouth daily.   diclofenac Sodium (VOLTAREN) 1 % GEL Apply 2 g topically 2 (two) times daily.   esomeprazole (NEXIUM) 40 MG capsule Take 40 mg by mouth 2 (two) times daily.   gabapentin (NEURONTIN) 300 MG capsule Take 300 mg by mouth 2 (two) times daily.   HYDROcodone-acetaminophen (NORCO) 5-325 MG tablet Take 1-2 tablets by mouth every 4 (four) hours as needed for moderate pain.   Insulin Aspart (NOVOLOG FLEXPEN [place]) Inject 10 Units into the skin as needed.   insulin glargine (LANTUS) 100 UNIT/ML injection Inject 28 Units into the skin every morning.   ipratropium-albuterol (DUONEB) 0.5-2.5 (3) MG/3ML SOLN Take 3 mLs by nebulization every 6 (six) hours as needed.   isosorbide mononitrate (IMDUR) 30 MG 24 hr tablet Take 1 tablet (30 mg total) by mouth daily.   LANTUS SOLOSTAR 100 UNIT/ML Solostar Pen Inject 28 Units into the skin every morning.   levothyroxine (SYNTHROID, LEVOTHROID) 75 MCG tablet Take 75 mcg by mouth daily before breakfast.    linagliptin (TRADJENTA) 5 MG TABS tablet Take 5 mg by mouth daily.   methocarbamol (ROBAXIN) 500 MG tablet Take 500 mg by mouth every 8 (eight) hours as needed for muscle spasms.   mometasone (ELOCON) 0.1 % cream Apply 1 application topically daily as needed (Rash). Qd to bid up to 5 days a week aa right lower leg prn itching   nitroGLYCERIN (NITROSTAT) 0.4 MG SL tablet Place 1 tablet (0.4 mg total) under the tongue every 5 (five) minutes as needed for chest pain.   ondansetron (ZOFRAN) 4 MG tablet Take 4 mg by mouth every 8 (eight) hours as needed for nausea or vomiting.    potassium chloride SA (KLOR-CON) 20 MEQ tablet Take 1-2 tablets (20-40 mEq total) by mouth See admin instructions. Take 40 mEq by mouth every other day, alternating with 20 mEq on alternate days.   ranolazine (RANEXA) 500 MG 12 hr  tablet Take 2 tablets (1,000 mg total) by mouth 2 (two) times daily.   ropinirole  (REQUIP) 5 MG tablet Take 5 mg by mouth at bedtime.   temazepam (RESTORIL) 30 MG capsule Take 30 mg by mouth at bedtime.   torsemide (DEMADEX) 20 MG tablet Take 1-2 tablets (20-40 mg total) by mouth See admin instructions. Take 40 mg by mouth every other day, alternating with 20 mg on alternate days   traMADol (ULTRAM) 50 MG tablet Take 50 mg by mouth every 6 (six) hours as needed for moderate pain or severe pain.   trolamine salicylate (ASPERCREME) 10 % cream Apply 1 application topically at bedtime.    Allergies  Allergen Reactions   Ezetimibe Diarrhea and Nausea Only   Metoclopramide Diarrhea   Propoxyphene Other (See Comments)    Unsure of reaction type Unsure of reaction type Unsure of reaction type   Hydrocodone Other (See Comments)   Tramadol Hives   Ceftin [Cefuroxime Axetil] Diarrhea   Codeine Rash        Penicillins Rash and Other (See Comments)    Has patient had a PCN reaction causing immediate rash, facial/tongue/throat swelling, SOB or lightheadedness with hypotension: NO Has patient had a PCN reaction causing severe rash involving mucus membranes or skin necrosis: NO Has patient had a PCN retioion that required hospitalization NO Has patient had a PCN reaction occurring within the last 10 years: NO If all of the above answers are "NO", then may proceed with Cephalosporin use.   Statins Other (See Comments)    Leg pain Leg pain Other reaction(s): restless leg syndrome   Vicodin [Hydrocodone-Acetaminophen] Other (See Comments)    passes out      Review of Systems negative except from HPI and PMH  Physical Exam BP 120/70 (BP Location: Left Arm, Patient Position: Sitting, Cuff Size: Large)    Pulse 89    Ht 5\' 1"  (1.549 m)    Wt 215 lb (97.5 kg)    SpO2 97%    BMI 40.62 kg/m  Well developed and well nourished in no acute distress HENT normal Neck supple   Clear Device pocket well healed; without hematoma or erythema.  There is no tethering  Regular rate and  rhythm, no  gallop No murmur Abd-soft with active BS No Clubbing cyanosis 1+ edema Skin-warm and dry A & Oriented  Grossly normal sensory and motor function  ECG sinus at 89 Interval 15/08/37 Otherwise normal   Assessment and Plan:  Syncope and exertional lightheadedness with prolonged asystole question neurally mediated   Atrial tachycardia-nonsustained  Pacemaker-Biotronik   Obstructive sleep apnea treated   Obesity   Diastolic heart failure   Coronary artery disease with abnormal FFR>> Cath mod LAD/D1 disease  Interval of presyncope.  Had sufficient warning to sit down; reviewed however the importance of not trying to get to a seat, especially in the bathroom but rather to sit herself on the floor where she is until the symptoms abate.  Dyspnea remains a major issue; she is working on losing weight; I have encouraged her to pursue this more aggressively.  We have discussed ways to augment the low carbohydrate nature of her diet.  She is volume overloaded.  We will increase her torsemide from 20 alternating with 40-40 daily.  We will check a metabolic profile in about 2 weeks.  No chest pain.  We will continue her on ranolazine.  We discussed the use of an SGLT2, however, previous efforts were associated with intertriginous  area infections.  We discussed that hopefully when she was able to lose some weight she would be able to keep this area drier and she might well be able to tolerate.  Moreover, if her back/knee issues improve she might be a candidate for rehab.  In the interim, have suggested that she try exercising with a CUBII.  She was unable to do water aerobics because it hurt her knee.  LDL is not at target.  I wonder whether adjunctive ezetimibe 10 would be of benefit.

## 2021-04-25 ENCOUNTER — Other Ambulatory Visit (INDEPENDENT_AMBULATORY_CARE_PROVIDER_SITE_OTHER): Payer: Medicare Other

## 2021-04-25 ENCOUNTER — Other Ambulatory Visit: Payer: Self-pay

## 2021-04-25 DIAGNOSIS — Z79899 Other long term (current) drug therapy: Secondary | ICD-10-CM

## 2021-04-25 DIAGNOSIS — I5032 Chronic diastolic (congestive) heart failure: Secondary | ICD-10-CM

## 2021-04-26 ENCOUNTER — Ambulatory Visit
Admission: RE | Admit: 2021-04-26 | Discharge: 2021-04-26 | Disposition: A | Discharge status: Home / Self Care | Payer: Medicare Other | Source: Ambulatory Visit | Attending: Internal Medicine | Admitting: Internal Medicine

## 2021-04-26 DIAGNOSIS — Z1231 Encounter for screening mammogram for malignant neoplasm of breast: Secondary | ICD-10-CM | POA: Diagnosis present

## 2021-04-26 LAB — BASIC METABOLIC PANEL
BUN/Creatinine Ratio: 17 (ref 12–28)
BUN: 23 mg/dL (ref 8–27)
CO2: 26 mmol/L (ref 20–29)
Calcium: 9.4 mg/dL (ref 8.7–10.3)
Chloride: 96 mmol/L (ref 96–106)
Creatinine, Ser: 1.33 mg/dL — ABNORMAL HIGH (ref 0.57–1.00)
Glucose: 146 mg/dL — ABNORMAL HIGH (ref 70–99)
Potassium: 3.3 mmol/L — ABNORMAL LOW (ref 3.5–5.2)
Sodium: 139 mmol/L (ref 134–144)
eGFR: 42 mL/min/{1.73_m2} — ABNORMAL LOW (ref >59)

## 2021-04-28 ENCOUNTER — Telehealth: Payer: Self-pay | Admitting: Internal Medicine

## 2021-04-28 DIAGNOSIS — E876 Hypokalemia: Secondary | ICD-10-CM

## 2021-04-28 MED ORDER — POTASSIUM CHLORIDE ER 10 MEQ PO TBCR: 20.0000 meq | EXTENDED_RELEASE_TABLET | Freq: Every day | ORAL | 3 refills | Status: DC

## 2021-04-28 NOTE — Telephone Encounter (Signed)
I called and spoke with the patient regarding her recent lab results. ?I advised her of Dr. Olin Pia recommendations as stated below to increase her potassium. ? ?The patient's chart states she is taking: ?Potassium 20 meq- taking 40 meq QOD alternating with 20 meq QOD. ?However, the patient advised that she is barely taking her potassium at all, maybe once a week, due to the size of the tablets. ? ?I have advised the patient that we could send in the potassium 10 meq dose as these pills will be smaller. ? ?Patient placed on hold while I confirmed with Ignacia Bayley, NP what dose the patient should take of her potassium. ?Per Gerald Stabs, NP- restart potassium at 20 meq once daily with a repeat BMP in 1 week. ? ?The patient is aware of the above NP recommendations. ?She is aware I will send in potassium 10 meq tablets or capsules to the pharmacy and she will need to take 2 tablets or capsules (20 meq) once daily. ?She is agreeable to coming for repeat lab work on Friday 05/05/21 at our office at 10:30 am.  ? ? ?

## 2021-04-28 NOTE — Telephone Encounter (Signed)
Ana Sprang, MD  ?04/26/2021  7:34 AM EDT   ?  ?Please Inform Patient ?That labs CR  is stable  ?Thanks  ? ?Patient's potassium level at 3.3 on her recent BMP. ?Reviewed again with provider: ? ?Ana Sprang, MD  Emily Filbert, RN ?Thanks for pick up  ?Lets have her increase her potassium from 40 alternating with 20-40 every day.  We should check the be met again in about 2 weeks ?

## 2021-05-05 ENCOUNTER — Other Ambulatory Visit (INDEPENDENT_AMBULATORY_CARE_PROVIDER_SITE_OTHER): Payer: Medicare Other

## 2021-05-05 ENCOUNTER — Other Ambulatory Visit: Payer: Self-pay

## 2021-05-05 DIAGNOSIS — E876 Hypokalemia: Secondary | ICD-10-CM

## 2021-05-06 ENCOUNTER — Other Ambulatory Visit: Payer: Self-pay

## 2021-05-06 ENCOUNTER — Emergency Department
Admission: EM | Admit: 2021-05-06 | Discharge: 2021-05-06 | Disposition: A | Discharge status: Home / Self Care | Payer: Medicare Other | Attending: Emergency Medicine | Admitting: Emergency Medicine

## 2021-05-06 ENCOUNTER — Emergency Department: Payer: Medicare Other

## 2021-05-06 ENCOUNTER — Ambulatory Visit
Admission: EM | Admit: 2021-05-06 | Discharge: 2021-05-06 | Disposition: A | Discharge status: Other Acute Inpatient Hospital | Payer: Medicare Other | Attending: Emergency Medicine | Admitting: Emergency Medicine

## 2021-05-06 ENCOUNTER — Encounter: Payer: Self-pay | Admitting: Emergency Medicine

## 2021-05-06 DIAGNOSIS — K5732 Diverticulitis of large intestine without perforation or abscess without bleeding: Secondary | ICD-10-CM | POA: Diagnosis not present

## 2021-05-06 DIAGNOSIS — Z8719 Personal history of other diseases of the digestive system: Secondary | ICD-10-CM

## 2021-05-06 DIAGNOSIS — E1122 Type 2 diabetes mellitus with diabetic chronic kidney disease: Secondary | ICD-10-CM | POA: Diagnosis not present

## 2021-05-06 DIAGNOSIS — Z794 Long term (current) use of insulin: Secondary | ICD-10-CM

## 2021-05-06 DIAGNOSIS — R739 Hyperglycemia, unspecified: Secondary | ICD-10-CM

## 2021-05-06 DIAGNOSIS — D631 Anemia in chronic kidney disease: Secondary | ICD-10-CM | POA: Insufficient documentation

## 2021-05-06 DIAGNOSIS — I10 Essential (primary) hypertension: Secondary | ICD-10-CM

## 2021-05-06 DIAGNOSIS — N189 Chronic kidney disease, unspecified: Secondary | ICD-10-CM | POA: Diagnosis not present

## 2021-05-06 DIAGNOSIS — I509 Heart failure, unspecified: Secondary | ICD-10-CM | POA: Diagnosis not present

## 2021-05-06 DIAGNOSIS — E1165 Type 2 diabetes mellitus with hyperglycemia: Secondary | ICD-10-CM | POA: Diagnosis not present

## 2021-05-06 DIAGNOSIS — R1032 Left lower quadrant pain: Secondary | ICD-10-CM

## 2021-05-06 DIAGNOSIS — K5792 Diverticulitis of intestine, part unspecified, without perforation or abscess without bleeding: Secondary | ICD-10-CM

## 2021-05-06 LAB — CBC
HCT: 34.4 % — ABNORMAL LOW (ref 36.0–46.0)
Hemoglobin: 10.8 g/dL — ABNORMAL LOW (ref 12.0–15.0)
MCH: 26.5 pg (ref 26.0–34.0)
MCHC: 31.4 g/dL (ref 30.0–36.0)
MCV: 84.5 fL (ref 80.0–100.0)
Platelets: 189 10*3/uL (ref 150–400)
RBC: 4.07 MIL/uL (ref 3.87–5.11)
RDW: 15.7 % — ABNORMAL HIGH (ref 11.5–15.5)
WBC: 6.9 10*3/uL (ref 4.0–10.5)
nRBC: 0 % (ref 0.0–0.2)

## 2021-05-06 LAB — COMPREHENSIVE METABOLIC PANEL
ALT: 14 U/L (ref 0–44)
AST: 19 U/L (ref 15–41)
Albumin: 3.9 g/dL (ref 3.5–5.0)
Alkaline Phosphatase: 130 U/L — ABNORMAL HIGH (ref 38–126)
Anion gap: 11 (ref 5–15)
BUN: 17 mg/dL (ref 8–23)
CO2: 29 mmol/L (ref 22–32)
Calcium: 9 mg/dL (ref 8.9–10.3)
Chloride: 99 mmol/L (ref 98–111)
Creatinine, Ser: 1.18 mg/dL — ABNORMAL HIGH (ref 0.44–1.00)
GFR, Estimated: 48 mL/min — ABNORMAL LOW (ref >60)
Glucose, Bld: 200 mg/dL — ABNORMAL HIGH (ref 70–99)
Potassium: 3.4 mmol/L — ABNORMAL LOW (ref 3.5–5.1)
Sodium: 139 mmol/L (ref 135–145)
Total Bilirubin: 0.7 mg/dL (ref 0.3–1.2)
Total Protein: 7.1 g/dL (ref 6.5–8.1)

## 2021-05-06 LAB — URINALYSIS, COMPLETE (UACMP) WITH MICROSCOPIC
Bilirubin Urine: NEGATIVE
Glucose, UA: 50 mg/dL — AB
Hgb urine dipstick: NEGATIVE
Ketones, ur: NEGATIVE mg/dL
Leukocytes,Ua: NEGATIVE
Nitrite: NEGATIVE
Protein, ur: NEGATIVE mg/dL
Specific Gravity, Urine: 1.01 (ref 1.005–1.030)
pH: 5 (ref 5.0–8.0)

## 2021-05-06 LAB — POCT URINALYSIS DIP (MANUAL ENTRY)
Bilirubin, UA: NEGATIVE
Blood, UA: NEGATIVE
Glucose, UA: NEGATIVE mg/dL
Ketones, POC UA: NEGATIVE mg/dL
Nitrite, UA: NEGATIVE
Spec Grav, UA: 1.02 (ref 1.010–1.025)
Urobilinogen, UA: 0.2 E.U./dL
pH, UA: 5.5 (ref 5.0–8.0)

## 2021-05-06 LAB — BASIC METABOLIC PANEL
BUN/Creatinine Ratio: 16 (ref 12–28)
BUN: 19 mg/dL (ref 8–27)
CO2: 28 mmol/L (ref 20–29)
Calcium: 9.6 mg/dL (ref 8.7–10.3)
Chloride: 97 mmol/L (ref 96–106)
Creatinine, Ser: 1.21 mg/dL — ABNORMAL HIGH (ref 0.57–1.00)
Glucose: 205 mg/dL — ABNORMAL HIGH (ref 70–99)
Potassium: 4 mmol/L (ref 3.5–5.2)
Sodium: 139 mmol/L (ref 134–144)
eGFR: 47 mL/min/{1.73_m2} — ABNORMAL LOW (ref >59)

## 2021-05-06 LAB — LIPASE, BLOOD: Lipase: 46 U/L (ref 11–51)

## 2021-05-06 LAB — POCT FASTING CBG KUC MANUAL ENTRY: POCT Glucose (KUC): 226 mg/dL — AB (ref 70–99)

## 2021-05-06 MED ORDER — CIPROFLOXACIN HCL 500 MG PO TABS: 500.0000 mg | ORAL_TABLET | Freq: Two times a day (BID) | ORAL | 0 refills | Status: DC

## 2021-05-06 MED ORDER — OXYCODONE-ACETAMINOPHEN 5-325 MG PO TABS
1.0000 | ORAL_TABLET | ORAL | Status: AC
  Administered 2021-05-06: 1 via ORAL
  Filled 2021-05-06: qty 1

## 2021-05-06 MED ORDER — METRONIDAZOLE 500 MG PO TABS
500.0000 mg | ORAL_TABLET | Freq: Once | ORAL | Status: AC
  Administered 2021-05-06: 500 mg via ORAL
  Filled 2021-05-06: qty 1

## 2021-05-06 MED ORDER — OXYCODONE-ACETAMINOPHEN 5-325 MG PO TABS: 1.0000 | ORAL_TABLET | Freq: Four times a day (QID) | ORAL | 0 refills | Status: DC | PRN

## 2021-05-06 MED ORDER — ONDANSETRON 4 MG PO TBDP: 4.0000 mg | ORAL_TABLET | Freq: Four times a day (QID) | ORAL | 0 refills | Status: DC | PRN

## 2021-05-06 MED ORDER — METRONIDAZOLE 500 MG PO TABS: 500.0000 mg | ORAL_TABLET | Freq: Three times a day (TID) | ORAL | 0 refills | Status: DC

## 2021-05-06 MED ORDER — IOHEXOL 300 MG/ML  SOLN
100.0000 mL | Freq: Once | INTRAMUSCULAR | Status: AC | PRN
  Administered 2021-05-06: 100 mL via INTRAVENOUS

## 2021-05-06 MED ORDER — CIPROFLOXACIN HCL 500 MG PO TABS
500.0000 mg | ORAL_TABLET | ORAL | Status: AC
Start: 2021-05-06 — End: 2021-05-06
  Administered 2021-05-06: 500 mg via ORAL
  Filled 2021-05-06: qty 1

## 2021-05-06 MED ORDER — ONDANSETRON HCL 4 MG/2ML IJ SOLN
4.0000 mg | INTRAMUSCULAR | Status: AC
  Administered 2021-05-06: 4 mg via INTRAVENOUS
  Filled 2021-05-06: qty 2

## 2021-05-06 NOTE — ED Triage Notes (Signed)
Pt presents with LLQ pain and nausea since yesterday.  ?

## 2021-05-06 NOTE — ED Triage Notes (Signed)
Pt via POV from home. Pt was sent from [facility] in [place] for further evaluation. Pt c/o LLQ pain and nausea since noon yesterday. Pt has a hx of diverticulitis. Pt is A&OX4 and NAD.  ?

## 2021-05-06 NOTE — ED Provider Notes (Signed)
?[facility] ? ? ? ?CSN: [id number] ?Arrival date & time: 05/06/21  9357 ? ? ?  ? ?History   ?Chief Complaint ?Chief Complaint  ?Patient presents with  ? Abdominal Pain  ?  Nausea, foamy/bubbly urine - Entered by patient  ? Nausea  ? ? ?HPI ?[name] is a 76 y.o. female.  Patient presents with left lower quadrant abdominal pain and nausea since yesterday afternoon.  No falls or trauma.  She denies fever, chills, vomiting, diarrhea, constipation, dysuria, hematuria, vaginal discharge, pelvic pain, or other symptoms.  Last bowel movement this morning.  No treatments at home.  Her medical history includes diverticulitis, diabetes, hypertension, heart failure, morbid obesity, CKD, hypothyroid, chronic low back pain, restless leg syndrome. ? ?The history is provided by the patient and medical records.  ? ?Past Medical History:  ?Diagnosis Date  ? Acquired elevated diaphragm   ? HIGHER ON RIGHT SIDE  ? Anemia   ? Anxiety   ? Aortic atherosclerosis ([place])   ? Arthritis   ? CAD (coronary artery disease)   ? a. 07/2017 MV: Low risk; b. 09/2017 Cath: LM nl, LAD 50p/m, D1 80ost, LCX large/nl, RCA small/nl.  ? Chronic back pain   ? stenosis.degenerative disc,some scoliosis  ? Chronic heart failure with preserved ejection fraction (HFpEF) ([place])   ? a. 07/2017 Echo: EF 60-65%, no rwma, gr2 DD, nl RV fxn.  ? Chronic kidney disease   ? stage 3  ? Complication of anesthesia 2018  ? aspirated during wrist surgery during LMA removal  ? Constipation   ? takes Stool Softener daily  ? Coronary artery disease   ? Depression   ? takes Cymbalta daily  ? Diabetes mellitus   ? Type 2 diabetic. Average fasting blood sugar runs high 170-200  ? Diastolic CHF ([place]) 01/77/9390  ? Per patient, diagnosed in 2018  ? Diverticulosis   ? Dysplastic nevus 09/12/2017  ? L sup buttock - mild   ? Dysplastic nevus 09/18/2018  ? R sup med buttocks - mild   ? E coli infection   ? GERD (gastroesophageal reflux disease)   ? takes Nexium daily  ? Grade  II diastolic dysfunction   ? Headache   ? Hemorrhoids   ? History of colon polyps   ? benign  ? History of gout   ? doesn't take any meds  ? History of hiatal hernia   ? small  ? History of vertigo   ? doesn't take any meds  ? Hyperlipidemia   ? takes Praluent daily  ? Hypertension   ? currently BP medications are on hold   ? Hypothyroidism   ? takes Synthroid daily  ? Insomnia   ? takes Restoril nightly  ? Muscle spasm   ? takes Robaxin as needed  ? NSVT (nonsustained ventricular tachycardia)   ? OSA on CPAP   ? Paroxysmal atrial tachycardia ([place])   ? Peripheral vascular disease ([place])   ? AAA as stated per pt / was just discovered and pt states has not been referred to vascular MD   ? Presence of permanent cardiac pacemaker   ? Restless leg   ? takes Requip daily  ? Rosacea   ? Symptomatic bradycardia   ? a. 02/2019 s/p Biotronik [place] 8 DR-T DC PPM  ? Syncope   ? a. Documented bradycardia and pauses-->02/2019 s/p Biotronik [place] 8 DR-T DC PPM.  ? Varicose veins   ? ? ?Patient Active Problem List  ?  Diagnosis Date Noted  ? Chest pain, rule out acute myocardial infarction 09/07/2020  ? GERD (gastroesophageal reflux disease) 05/08/2020  ? Restless leg syndrome 05/08/2020  ? Coronary disease 05/08/2020  ? Status post placement of cardiac pacemaker 04/29/2019  ? Body mass index (BMI) 38.0-38.9, adult 02/24/2019  ? Spondylolisthesis, lumbar region 02/24/2019  ? Tubular adenoma 12/10/2018  ? CKD (chronic kidney disease) stage 3, GFR 30-59 ml/min (HCC) 02/27/2018  ? Lumbar spondylosis 02/18/2018  ? Inflammatory arthritis 02/13/2018  ? Bilateral hand pain 01/22/2018  ? Acquired trigger finger 01/22/2018  ? Dupuytren's disease of palm 01/22/2018  ? Elevated erythrocyte sedimentation rate 01/14/2018  ? Polyarthralgia 01/14/2018  ? Atrial tachycardia ([place]) 01/05/2018  ? History of shingles 12/26/2017  ? Abnormal cardiac CT angiography   ? Diverticulitis 07/27/2017  ? Diverticulitis of large intestine without perforation or  abscess without bleeding 07/26/2017  ? Medicare annual wellness visit, initial 04/15/2017  ? Sciatica 01/15/2017  ? Lymphedema 12/18/2016  ? Adult idiopathic generalized osteoporosis 10/08/2016  ? Chronic diastolic heart failure ([place]) 08/10/2016  ? Hypertensive heart disease 07/25/2016  ? Morbid obesity ([place]) 07/25/2016  ? Physical deconditioning 07/25/2016  ? Pyuria 06/25/2016  ? Abdominal pain 06/24/2016  ? Abscess of back   ? Lumbar degenerative disc disease 11/14/2015  ? Chronic low back pain 08/26/2015  ? Lumbar radiculopathy 08/26/2015  ? Anxiety 02/24/2014  ? Lumbar stenosis with neurogenic claudication 11/03/2013  ? Chest pain 06/09/2012  ? Atherosclerosis of native coronary artery with stable angina pectoris ([place]) 06/09/2012  ? Hyperlipidemia 08/09/2011  ? Arm pain 06/28/2011  ? Diabetes mellitus ([place]) 05/05/2010  ? OSA on CPAP 05/05/2010  ? HTN (hypertension) 05/05/2010  ? Edema 05/05/2010  ? Hypothyroidism 02/12/2010  ? HYPERTRIGLYCERIDEMIA 06/08/2008  ? Exertional dyspnea 06/08/2008  ? ? ?Past Surgical History:  ?Procedure Laterality Date  ? ABDOMINAL HYSTERECTOMY    ? with BSo  ? CARDIAC CATHETERIZATION  2013  ? Normal  ? CARPAL TUNNEL RELEASE Bilateral   ? CATARACT EXTRACTION, BILATERAL    ? CHOLECYSTECTOMY    ? COLONOSCOPY    ? COLONOSCOPY WITH PROPOFOL N/A 11/25/2018  ? Procedure: COLONOSCOPY WITH PROPOFOL;  Surgeon: Toledo, Benay Pike, MD;  Location: [facility] END[facility];  Service: Gastroenterology;  Laterality: N/A;  ? DIAGNOSTIC LAPAROSCOPY    ? multiple times  ? DILATION AND CURETTAGE OF UTERUS    ? ESOPHAGOGASTRODUODENOSCOPY (EGD) WITH PROPOFOL N/A 11/01/2014  ? Procedure: ESOPHAGOGASTRODUODENOSCOPY (EGD) WITH PROPOFOL;  Surgeon: Hulen Luster, MD;  Location: [facility]COPY;  Service: Gastroenterology;  Laterality: N/A;  ? ESOPHAGOGASTRODUODENOSCOPY (EGD) WITH PROPOFOL N/A 11/25/2018  ? Procedure: ESOPHAGOGASTRODUODENOSCOPY (EGD) WITH PROPOFOL;  Surgeon: Toledo, Benay Pike, MD;  Location: [facility] ENDOSCOPY;   Service: Gastroenterology;  Laterality: N/A;  ? HARDWARE REMOVAL Left 10/11/2016  ? Procedure: HARDWARE REMOVAL-LEFT RADIUS;  Surgeon: Lovell Sheehan, MD;  Location: [facility];  Service: Orthopedics;  Laterality: Left;  Left Radius Wrist   ? IMPLANTABLE CONTACT LENS IMPLANTATION    ? bilateral  ? KNEE ARTHROSCOPY Right 05/09/2020  ? Procedure: ARTHROSCOPY KNEE;  Surgeon: Dereck Leep, MD;  Location: [facility] OR[facility];  Service: Orthopedics;  Laterality: Right;  ? LAMINECTOMY  11/13/2015  ? LEFT HEART CATH AND CORONARY ANGIOGRAPHY Left 10/01/2017  ? Procedure: LEFT HEART CATH AND CORONARY ANGIOGRAPHY;  Surgeon: Nelva Bush, MD;  Location: [facility];  Service: Cardiovascular;  Laterality: Left;  ? LOOP RECORDER INSERTION N/A 10/17/2017  ? Procedure: LOOP RECORDER INSERTION;  Surgeon: Deboraha Sprang, MD;  Location: [facility];  Service: Cardiovascular;  Laterality: N/A;  ? LOOP RECORDER REMOVAL N/A 03/04/2019  ? Procedure: LOOP RECORDER REMOVAL;  Surgeon: Deboraha Sprang, MD;  Location: [facility];  Service: Cardiovascular;  Laterality: N/A;  ? LUMBAR FUSION  11/2015  ? LUMBAR WOUND DEBRIDEMENT N/A 12/02/2015  ? Procedure: WOUND Exploration;  Surgeon: Consuella Lose, MD;  Location: [facility];  Service: Neurosurgery;  Laterality: N/A;  ? OPEN REDUCTION INTERNAL FIXATION (ORIF) DISTAL RADIAL FRACTURE Left 08/29/2016  ? Procedure: OPEN REDUCTION INTERNAL FIXATION (ORIF) DISTAL RADIAL FRACTURE;  Surgeon: Lovell Sheehan, MD;  Location: [facility];  Service: Orthopedics;  Laterality: Left;  ? PACEMAKER IMPLANT N/A 03/04/2019  ? Procedure: PACEMAKER IMPLANT;  Surgeon: Deboraha Sprang, MD;  Location: [facility] [facility];  Service: Cardiovascular;  Laterality: N/A;  ? PICC LINE PLACE PERIPHERAL ([place] HX)    ? right upper arm   ? ROTATOR CUFF REPAIR Right   ? SAVORY DILATION N/A 11/01/2014  ? Procedure: SAVORY DILATION;  Surgeon: Hulen Luster, MD;  Location: [facility]COPY;  Service: Gastroenterology;  Laterality:  N/A;  ? TONSILLECTOMY    ? TRIGGER FINGER RELEASE Bilateral   ? ? ?OB History   ?No obstetric history on file. ?  ? ? ? ?Home Medications   ? ?Prior to Admission medications   ?Medication Sig Start Date End

## 2021-05-06 NOTE — ED Provider Notes (Signed)
? ?[facility] ?Provider Note ? ? ? Event Date/Time  ? First MD Initiated Contact with Patient 05/06/21 [id number]   ?  (approximate) ? ? ?History  ? ?Abdominal Pain ? ? ?HPI ? ?[name] is a 76 y.o. female who per outside record which I have reviewed including today from urgent care dated today has a history of diverticulosis, diverticulitis, congestive heart failure, nonsustained V. Tach, CKD, diabetes ? ?Yesterday around noon she developing a pain that was fairly severe in her left lower abdomen associated with nausea.  No vomiting no diarrhea.  Had 1 bowel movement this morning that she describes as normal but a little more loose than typical.  No fevers.  No chest pain no trouble breathing no upper abdominal pain pain is always been in the left lower seems somewhat better but is still moderate located in the left lower abdomen ?  ?Had a little bit of a urge to urinate yesterday, but that is gone away.  No dark or abnormal odor.  Does not burn when she urinates or urination now feels normal no history of kidney stones.  No pain in the back or flanks other than her chronic back pain. ? ?Still able to eat and drink, but reports pain was notably severe yesterday but seems to be getting better ? ?Physical Exam  ? ?Triage Vital Signs: ?ED Triage Vitals  ?Enc Vitals Group  ?   BP 05/06/21 0929 (!) 183/75  ?   Pulse Rate 05/06/21 0929 95  ?   Resp 05/06/21 0929 20  ?   Temp 05/06/21 0929 98.3 ?F (36.8 ?C)  ?   Temp Source 05/06/21 0929 Oral  ?   SpO2 05/06/21 0929 100 %  ?   Weight 05/06/21 0928 207 lb (93.9 kg)  ?   Height 05/06/21 0928 '5\' 1"'$  (1.549 m)  ?   Head Circumference --   ?   Peak Flow --   ?   Pain Score 05/06/21 0927 5  ?   Pain Loc --   ?   Pain Edu? --   ?   Excl. in [place]? --   ? ? ?Most recent vital signs: ?Vitals:  ? 05/06/21 1200 05/06/21 1230  ?BP: 125/73 (!) 144/65  ?Pulse: 95 86  ?Resp:  16  ?Temp:    ?SpO2: 99% 94%  ? ? ? ?General: Awake, no distress.  Very pleasant, husband  also escorting her ?CV:  Good peripheral perfusion.  Normal heart tones.  Clear lung sounds ?Resp:  Normal effort.  Normal work of breathing.  Speaks in full clear sentences ?Abd:  No distention.  No pain to palpation in the upper abdomen or epigastrium.  No pain in the right lower quadrant.  Reports focal pain moderate to severe in intensity located in the left lower quadrant without rebound or guarding discomfort.  No acute peritonitis to noted by clinical exam but focal left lower quadrant pain is apparent ?Other:  No noted abdominal masses or hernias.  No tenderness to percussion of the bilateral costovertebral angles. ?Mucous membranes are moist.  She appears euvolemic by clinical assessment ? ?ED Results / Procedures / Treatments  ? ?Labs ?(all labs ordered are listed, but only abnormal results are displayed) ?Labs Reviewed  ?COMPREHENSIVE METABOLIC PANEL - Abnormal; Notable for the following components:  ?    Result Value  ? Potassium 3.4 (*)   ? Glucose, Bld 200 (*)   ? Creatinine, Ser 1.18 (*)   ?  Alkaline Phosphatase 130 (*)   ? GFR, Estimated 48 (*)   ? All other components within normal limits  ?CBC - Abnormal; Notable for the following components:  ? Hemoglobin 10.8 (*)   ? HCT 34.4 (*)   ? RDW 15.7 (*)   ? All other components within normal limits  ?URINALYSIS, COMPLETE (UACMP) WITH MICROSCOPIC - Abnormal; Notable for the following components:  ? Color, Urine YELLOW (*)   ? APPearance HAZY (*)   ? Glucose, UA 50 (*)   ? Bacteria, UA FEW (*)   ? All other components within normal limits  ?URINE CULTURE  ?LIPASE, BLOOD  ? ? ? ?RADIOLOGY ?CT ABDOMEN PELVIS W CONTRAST ? ?Result Date: 05/06/2021 ?CLINICAL DATA:  Left lower quadrant pain and nausea. EXAM: CT ABDOMEN AND PELVIS WITH CONTRAST TECHNIQUE: Multidetector CT imaging of the abdomen and pelvis was performed using the standard protocol following bolus administration of intravenous contrast. RADIATION DOSE REDUCTION: This exam was performed according to  the departmental dose-optimization program which includes automated exposure control, adjustment of the mA and/or kV according to patient size and/or use of iterative reconstruction technique. CONTRAST:  135m OMNIPAQUE IOHEXOL 300 MG/ML  SOLN COMPARISON:  02/22/2018 FINDINGS: Lower Chest: No acute findings. Hepatobiliary: No hepatic masses identified. Prior cholecystectomy. No evidence of biliary obstruction. Pancreas:  No mass or inflammatory changes. Spleen: Within normal limits in size and appearance. Adrenals/Urinary Tract: No masses identified. No evidence of ureteral calculi or hydronephrosis. Stomach/Bowel: Small hiatal hernia again noted. Mild diverticulitis is seen involving the mid descending colon and proximal sigmoid colon. No evidence of perforation or abscess. Vascular/Lymphatic: No pathologically enlarged lymph nodes. No acute vascular findings. Aortic atherosclerotic calcification noted. Reproductive: Prior hysterectomy noted. Adnexal regions are unremarkable in appearance. Other:  Tiny umbilical hernia is again seen which contains only fat. Musculoskeletal: No suspicious bone lesions identified. Lumbar spine fusion hardware again noted. IMPRESSION: Mild diverticulitis involving mid descending and proximal sigmoid colon. No evidence of perforation or abscess. Stable small hiatal hernia, and small umbilical hernia which contains only fat. Aortic Atherosclerosis (ICD10-I70.0). Electronically Signed   By: JMarlaine HindM.D.   On: 05/06/2021 11:49   ? ? ? ? ?PROCEDURES: ? ?Critical Care performed: No ? ?Procedures ? ? ?MEDICATIONS ORDERED IN ED: ?Medications  ?ciprofloxacin (CIPRO) tablet 500 mg (has no administration in time range)  ?metroNIDAZOLE (FLAGYL) tablet 500 mg (has no administration in time range)  ?oxyCODONE-acetaminophen (PERCOCET/ROXICET) 5-325 MG per tablet 1 tablet (1 tablet Oral Given 05/06/21 1034)  ?ondansetron ([facility] injection 4 mg (4 mg Intravenous Given 05/06/21 1034)  ?iohexol  (OMNIPAQUE) 300 MG/ML solution 100 mL (100 mLs Intravenous Contrast Given 05/06/21 1127)  ? ? ? ?IMPRESSION / MDM / ASSESSMENT AND PLAN / ED COURSE  ?I reviewed the triage vital signs and the nursing notes. ?             ?               ? ?Differential diagnosis includes but is not limited to, abdominal perforation, aortic dissection, cholecystitis, appendicitis, diverticulitis, colitis, esophagitis/gastritis, kidney stone, pyelonephritis, urinary tract infection, aortic aneurysm. All are considered in decision and treatment plan. Based upon the patient's presentation and risk factors, suspect likely diverticulitis based on the focality of discomfort in the left lower quadrant, but also would entertain other etiologies as well. ? ?Labs are notable for a mild anemia.  Normal white count.  Mild baseline chronic kidney disease.  Urinalysis does not appear overtly infected,  urine culture sent ? ?----------------------------------------- ?12:37 PM on 05/06/2021 ?----------------------------------------- ?I personally reviewed and interpreted the patient's CT abdomen pelvis for gross pathology, appears consistent with mild diverticulitis. ? ? ? ? ?The patient is on the cardiac monitor to evaluate for evidence of arrhythmia and/or significant heart rate changes. ? ?And discussed treatment recommendations and in consideration of her antibiotic allergies we will treat with ciprofloxacin and Flagyl.  We did discuss the black box warning accompanying ciprofloxacin regarding joint injuries and other complications.  Patient is understand this, but in the setting of diverticulitis and her allergies to penicillin as well as cephalosporin seems prudent to treat with ciprofloxacin and Flagyl ? ?Reviewed careful return precautions with both patient and her husband, she knows not to drive while taking Percocet.  They will follow-up with her primary care doctor.  She is alert well oriented nontoxic well-appearing.  Pain well controlled  with Percocet here no vomiting ? ?Return precautions and treatment recommendations and follow-up discussed with the patient who is agreeable with the plan. ? ?  ? ? ?FINAL CLINICAL IMPRESSION(S) / ED DIAGNO

## 2021-05-06 NOTE — Discharge Instructions (Addendum)
Go to the emergency department for evaluation of your abdominal pain with history of diverticulitis, hyperglycemia with diabetes, elevated blood pressure with hypertension.   ? ? ?

## 2021-05-06 NOTE — ED Notes (Signed)
RN to bedside to introduce self to pt and initiate care. MD at bedside.  ?

## 2021-05-07 LAB — URINE CULTURE

## 2021-05-22 ENCOUNTER — Encounter: Payer: Self-pay | Admitting: Internal Medicine

## 2021-05-22 NOTE — Telephone Encounter (Signed)
Called to congratulate !!!!  ?

## 2021-05-31 ENCOUNTER — Ambulatory Visit (INDEPENDENT_AMBULATORY_CARE_PROVIDER_SITE_OTHER): Payer: Medicare Other

## 2021-05-31 DIAGNOSIS — I471 Supraventricular tachycardia: Secondary | ICD-10-CM | POA: Diagnosis not present

## 2021-05-31 LAB — CUP PACEART REMOTE DEVICE CHECK
Date Time Interrogation Session: 20230418083009
Implantable Lead Implant Date: 20210120
Implantable Lead Implant Date: 20210120
Implantable Lead Location: 753859
Implantable Lead Location: 753860
Implantable Lead Model: 5076
Implantable Lead Model: 5076
Implantable Pulse Generator Implant Date: 20210120
Pulse Gen Model: 407145
Pulse Gen Serial Number: 69765687

## 2021-06-12 ENCOUNTER — Other Ambulatory Visit: Payer: Self-pay | Admitting: Family Medicine

## 2021-06-12 ENCOUNTER — Ambulatory Visit
Admission: RE | Admit: 2021-06-12 | Discharge: 2021-06-12 | Disposition: A | Discharge status: Home / Self Care | Payer: Medicare Other | Source: Ambulatory Visit | Attending: Family Medicine | Admitting: Family Medicine

## 2021-06-12 DIAGNOSIS — R1032 Left lower quadrant pain: Secondary | ICD-10-CM

## 2021-06-12 MED ORDER — IOHEXOL 350 MG/ML SOLN
100.0000 mL | Freq: Once | INTRAVENOUS | Status: AC | PRN
  Administered 2021-06-12: 100 mL via INTRAVENOUS

## 2021-06-13 ENCOUNTER — Encounter: Payer: Self-pay | Admitting: Internal Medicine

## 2021-06-16 NOTE — Progress Notes (Signed)
Remote pacemaker transmission.   

## 2021-06-29 ENCOUNTER — Encounter: Payer: Self-pay | Admitting: Dermatology

## 2021-06-29 ENCOUNTER — Ambulatory Visit (INDEPENDENT_AMBULATORY_CARE_PROVIDER_SITE_OTHER): Payer: Medicare Other | Admitting: Dermatology

## 2021-06-29 DIAGNOSIS — D692 Other nonthrombocytopenic purpura: Secondary | ICD-10-CM

## 2021-06-29 DIAGNOSIS — D492 Neoplasm of unspecified behavior of bone, soft tissue, and skin: Secondary | ICD-10-CM

## 2021-06-29 DIAGNOSIS — L821 Other seborrheic keratosis: Secondary | ICD-10-CM

## 2021-06-29 DIAGNOSIS — D1800 Hemangioma unspecified site: Secondary | ICD-10-CM

## 2021-06-29 DIAGNOSIS — L814 Other melanin hyperpigmentation: Secondary | ICD-10-CM

## 2021-06-29 DIAGNOSIS — Z86018 Personal history of other benign neoplasm: Secondary | ICD-10-CM

## 2021-06-29 DIAGNOSIS — L578 Other skin changes due to chronic exposure to nonionizing radiation: Secondary | ICD-10-CM | POA: Diagnosis not present

## 2021-06-29 DIAGNOSIS — L719 Rosacea, unspecified: Secondary | ICD-10-CM

## 2021-06-29 DIAGNOSIS — D229 Melanocytic nevi, unspecified: Secondary | ICD-10-CM

## 2021-06-29 DIAGNOSIS — Z1283 Encounter for screening for malignant neoplasm of skin: Secondary | ICD-10-CM | POA: Diagnosis not present

## 2021-06-29 DIAGNOSIS — D485 Neoplasm of uncertain behavior of skin: Secondary | ICD-10-CM | POA: Diagnosis not present

## 2021-06-29 MED ORDER — FINACEA 15 % EX FOAM: 1.0000 "application " | CUTANEOUS | 4 refills | Status: DC

## 2021-06-29 NOTE — Progress Notes (Signed)
Follow-Up Visit   Subjective  [name] is a 76 y.o. female who presents for the following: Total body skin exam (Hx of Dysplastic L sup buttock, R sup med buttock), Rosacea (Finacea foam bid), and Porokeratosis (L mid pretibia, burning). The patient presents for Total-Body Skin Exam (TBSE) for skin cancer screening and mole check.  The patient has spots, moles and lesions to be evaluated, some may be new or changing and the patient has concerns that these could be cancer.   The following portions of the chart were reviewed this encounter and updated as appropriate:   Tobacco  Allergies  Meds  Problems  Med Hx  Surg Hx  Fam Hx     Review of Systems:  No other skin or systemic complaints except as noted in HPI or Assessment and Plan.  Objective  Well appearing patient in no apparent distress; mood and affect are within normal limits.  A full examination was performed including scalp, head, eyes, ears, nose, lips, neck, chest, axillae, abdomen, back, buttocks, bilateral upper extremities, bilateral lower extremities, hands, feet, fingers, toes, fingernails, and toenails. All findings within normal limits unless otherwise noted below.  L mid pretibial 2.0 x 1.4cm pink annular patch with elevated keratotic rim      Assessment & Plan   Purpura - Chronic; persistent and recurrent.  Treatable, but not curable. - Violaceous macules and patches - Benign - Related to trauma, age, sun damage and/or use of blood thinners, chronic use of topical and/or oral steroids - Observe - Can use OTC arnica containing moisturizer such as Dermend Bruise Formula if desired - Call for worsening or other concerns  Lentigines - Scattered tan macules - Due to sun exposure - Benign-appearing, observe - Recommend daily broad spectrum sunscreen SPF 30+ to sun-exposed areas, reapply every 2 hours as needed. - Call for any changes  Seborrheic Keratoses - Stuck-on, waxy, tan-brown papules and/or  plaques  - Benign-appearing - Discussed benign etiology and prognosis. - Observe - Call for any changes  Melanocytic Nevi - Tan-brown and/or pink-flesh-colored symmetric macules and papules - Benign appearing on exam today - Observation - Call clinic for new or changing moles - Recommend daily use of broad spectrum spf 30+ sunscreen to sun-exposed areas.   Hemangiomas - Red papules - Discussed benign nature - Observe - Call for any changes  Actinic Damage - Chronic condition, secondary to cumulative UV/sun exposure - diffuse scaly erythematous macules with underlying dyspigmentation - Recommend daily broad spectrum sunscreen SPF 30+ to sun-exposed areas, reapply every 2 hours as needed.  - Staying in the shade or wearing long sleeves, sun glasses (UVA+UVB protection) and wide brim hats (4-inch brim around the entire circumference of the hat) are also recommended for sun protection.  - Call for new or changing lesions.  Skin cancer screening performed today.  History of Dysplastic Nevi - No evidence of recurrence today - Recommend regular full body skin exams - Recommend daily broad spectrum sunscreen SPF 30+ to sun-exposed areas, reapply every 2 hours as needed.  - Call if any new or changing lesions are noted between office visits  -L sup buttock, R sup med buttock  Rosacea face  Rosacea is a chronic progressive skin condition usually affecting the face of adults, causing redness and/or acne bumps. It is treatable but not curable. It sometimes affects the eyes (ocular rosacea) as well. It may respond to topical and/or systemic medication and can flare with stress, sun exposure, alcohol, exercise and  some foods.  Daily application of broad spectrum spf 30+ sunscreen to face is recommended to reduce flares.  Cont Finacea foam bid to face  Related Medications Azelaic Acid (FINACEA) 15 % FOAM Apply 1 application. topically as directed. Qd to bid to aa face  Neoplasm of  skin L mid pretibial  Epidermal / dermal shaving  Lesion diameter (cm):  2 Informed consent: discussed and consent obtained   Timeout: patient name, date of birth, surgical site, and procedure verified   Procedure prep:  Patient was prepped and draped in usual sterile fashion Prep type:  Isopropyl alcohol Anesthesia: the lesion was anesthetized in a standard fashion   Anesthetic:  1% lidocaine w/ epinephrine 1-100,000 buffered w/ 8.4% NaHCO3 Instrument used: flexible razor blade   Hemostasis achieved with: pressure, aluminum chloride and electrodesiccation   Outcome: patient tolerated procedure well   Post-procedure details: sterile dressing applied and wound care instructions given   Dressing type: bandage, bacitracin and pressure dressing    Specimen 1 - Surgical pathology Differential Diagnosis: D48.5 Porokeratosis vs other  Check Margins: yes 2.0 x 1.4cm pink annular patch with elevated keratotic rim 2 pieces  Discussed txt options topical txt Cholesterol/Lovastatin cr vs bx vs shave removal   Skin cancer screening   Return in about 1 year (around 06/30/2022) for TBSE, Hx of Dysplastic nevi, Rosacea.  I, Othelia Pulling, RMA, am acting as scribe for Sarina Ser, MD . Documentation: I have reviewed the above documentation for accuracy and completeness, and I agree with the above.  Sarina Ser, MD

## 2021-06-29 NOTE — Patient Instructions (Addendum)

## 2021-07-04 ENCOUNTER — Telehealth: Payer: Self-pay

## 2021-07-04 NOTE — Telephone Encounter (Signed)
Left voicemail to return my call

## 2021-07-04 NOTE — Telephone Encounter (Signed)
-----   Message from Ralene Bathe, MD sent at 06/30/2021  6:31 PM EDT ----- Diagnosis Skin , left mid pretibial SUPERFICIAL ACTINIC POROKERATOSIS  Benign Porokeratosis  As suspected No further treatment needed May recur

## 2021-07-05 ENCOUNTER — Telehealth: Payer: Self-pay

## 2021-07-05 NOTE — Telephone Encounter (Signed)
-----   Message from Ralene Bathe, MD sent at 06/30/2021  6:31 PM EDT ----- Diagnosis Skin , left mid pretibial SUPERFICIAL ACTINIC POROKERATOSIS  Benign Porokeratosis  As suspected No further treatment needed May recur

## 2021-07-05 NOTE — Telephone Encounter (Signed)
Pt returned Ashley's phone call.  Called pt back today, left VM. aw

## 2021-07-09 ENCOUNTER — Encounter: Payer: Self-pay | Admitting: Dermatology

## 2021-07-13 ENCOUNTER — Telehealth: Payer: Self-pay | Admitting: Cardiovascular Disease

## 2021-07-13 MED ORDER — NITROGLYCERIN 0.4 MG SL SUBL: 0.4000 mg | SUBLINGUAL_TABLET | SUBLINGUAL | 0 refills | Status: DC | PRN

## 2021-07-13 NOTE — Telephone Encounter (Signed)
Requested Prescriptions   Signed Prescriptions Disp Refills   nitroGLYCERIN (NITROSTAT) 0.4 MG SL tablet 25 tablet 0    Sig: Place 1 tablet (0.4 mg total) under the tongue every 5 (five) minutes as needed for chest pain.    Authorizing Provider: Minna Merritts    Ordering User: Britt Bottom

## 2021-07-13 NOTE — Telephone Encounter (Signed)
*  STAT* If patient is at the pharmacy, call can be transferred to refill team.   1. Which medications need to be refilled? (please list name of each medication and dose if known) torsemide (DEMADEX) 20 MG tablet nitroGLYCERIN (NITROSTAT) 0.4 MG SL tablet  2. Which pharmacy/location (including street and city if local pharmacy) is medication to be sent to? [facility] [place], [place]  3. Do they need a 30 day or 90 day supply? [place]

## 2021-07-19 ENCOUNTER — Telehealth: Payer: Self-pay

## 2021-07-19 NOTE — Telephone Encounter (Signed)
-----   Message from Ralene Bathe, MD sent at 06/30/2021  6:31 PM EDT ----- Diagnosis Skin , left mid pretibial SUPERFICIAL ACTINIC POROKERATOSIS  Benign Porokeratosis  As suspected No further treatment needed May recur

## 2021-07-19 NOTE — Telephone Encounter (Signed)
Advised patient of results/hd  

## 2021-08-04 ENCOUNTER — Other Ambulatory Visit: Payer: Self-pay

## 2021-08-04 ENCOUNTER — Encounter: Payer: Self-pay | Admitting: Physician Assistant

## 2021-08-04 ENCOUNTER — Ambulatory Visit (INDEPENDENT_AMBULATORY_CARE_PROVIDER_SITE_OTHER): Payer: Medicare Other | Admitting: Physician Assistant

## 2021-08-04 ENCOUNTER — Emergency Department: Payer: Medicare Other

## 2021-08-04 ENCOUNTER — Emergency Department
Admission: EM | Admit: 2021-08-04 | Discharge: 2021-08-04 | Disposition: A | Discharge status: Home / Self Care | Payer: Medicare Other | Attending: Student in an Organized Health Care Education/Training Program | Admitting: Student in an Organized Health Care Education/Training Program

## 2021-08-04 VITALS — BP 172/92 | HR 96 | Ht 61.5 in | Wt 209.8 lb

## 2021-08-04 DIAGNOSIS — I10 Essential (primary) hypertension: Secondary | ICD-10-CM

## 2021-08-04 DIAGNOSIS — R001 Bradycardia, unspecified: Secondary | ICD-10-CM | POA: Diagnosis not present

## 2021-08-04 DIAGNOSIS — E785 Hyperlipidemia, unspecified: Secondary | ICD-10-CM

## 2021-08-04 DIAGNOSIS — I25110 Atherosclerotic heart disease of native coronary artery with unstable angina pectoris: Secondary | ICD-10-CM

## 2021-08-04 DIAGNOSIS — R61 Generalized hyperhidrosis: Secondary | ICD-10-CM | POA: Insufficient documentation

## 2021-08-04 DIAGNOSIS — R55 Syncope and collapse: Secondary | ICD-10-CM

## 2021-08-04 DIAGNOSIS — I471 Supraventricular tachycardia: Secondary | ICD-10-CM

## 2021-08-04 DIAGNOSIS — I455 Other specified heart block: Secondary | ICD-10-CM | POA: Diagnosis not present

## 2021-08-04 DIAGNOSIS — R0789 Other chest pain: Secondary | ICD-10-CM | POA: Diagnosis present

## 2021-08-04 DIAGNOSIS — Z950 Presence of cardiac pacemaker: Secondary | ICD-10-CM

## 2021-08-04 DIAGNOSIS — I2510 Atherosclerotic heart disease of native coronary artery without angina pectoris: Secondary | ICD-10-CM | POA: Insufficient documentation

## 2021-08-04 DIAGNOSIS — I4719 Other supraventricular tachycardia: Secondary | ICD-10-CM

## 2021-08-04 DIAGNOSIS — R079 Chest pain, unspecified: Secondary | ICD-10-CM

## 2021-08-04 DIAGNOSIS — I5032 Chronic diastolic (congestive) heart failure: Secondary | ICD-10-CM

## 2021-08-04 LAB — CBC
HCT: 38 % (ref 36.0–46.0)
Hemoglobin: 11.9 g/dL — ABNORMAL LOW (ref 12.0–15.0)
MCH: 26.2 pg (ref 26.0–34.0)
MCHC: 31.3 g/dL (ref 30.0–36.0)
MCV: 83.5 fL (ref 80.0–100.0)
Platelets: 226 10*3/uL (ref 150–400)
RBC: 4.55 MIL/uL (ref 3.87–5.11)
RDW: 17.1 % — ABNORMAL HIGH (ref 11.5–15.5)
WBC: 8.1 10*3/uL (ref 4.0–10.5)
nRBC: 0 % (ref 0.0–0.2)

## 2021-08-04 LAB — BASIC METABOLIC PANEL
Anion gap: 9 (ref 5–15)
BUN: 20 mg/dL (ref 8–23)
CO2: 30 mmol/L (ref 22–32)
Calcium: 9.3 mg/dL (ref 8.9–10.3)
Chloride: 102 mmol/L (ref 98–111)
Creatinine, Ser: 1.32 mg/dL — ABNORMAL HIGH (ref 0.44–1.00)
GFR, Estimated: 42 mL/min — ABNORMAL LOW (ref >60)
Glucose, Bld: 111 mg/dL — ABNORMAL HIGH (ref 70–99)
Potassium: 3.7 mmol/L (ref 3.5–5.1)
Sodium: 141 mmol/L (ref 135–145)

## 2021-08-04 LAB — TROPONIN I (HIGH SENSITIVITY)
Troponin I (High Sensitivity): 3 ng/L (ref <18)
Troponin I (High Sensitivity): <2 ng/L (ref <18)

## 2021-08-04 MED ORDER — ISOSORBIDE MONONITRATE ER 60 MG PO TB24
30.0000 mg | ORAL_TABLET | Freq: Every day | ORAL | Status: DC
  Administered 2021-08-04: 30 mg via ORAL
  Filled 2021-08-04: qty 1

## 2021-08-04 MED ORDER — NITROGLYCERIN 0.4 MG SL SUBL
0.4000 mg | SUBLINGUAL_TABLET | SUBLINGUAL | Status: DC | PRN
  Administered 2021-08-04: 0.4 mg via SUBLINGUAL

## 2021-08-07 ENCOUNTER — Telehealth: Payer: Self-pay | Admitting: Cardiovascular Disease

## 2021-08-07 ENCOUNTER — Other Ambulatory Visit: Payer: Self-pay | Admitting: *Deleted

## 2021-08-07 DIAGNOSIS — R072 Precordial pain: Secondary | ICD-10-CM

## 2021-08-07 MED ORDER — METOPROLOL TARTRATE 100 MG PO TABS
ORAL_TABLET | ORAL | 0 refills | Status: DC
Start: 2021-08-07 — End: 2021-08-18

## 2021-08-07 MED ORDER — IVABRADINE HCL 7.5 MG PO TABS: ORAL_TABLET | ORAL | 0 refills | Status: DC

## 2021-08-07 NOTE — Telephone Encounter (Signed)
Changes noted on EKG may be in the setting of lead placement.  However, she was previously noted to have had a blockage along a branch vessel, and nonobstructive blockage along a vessel in the territory noted on her EKG.  No evidence of MI on blood work in the ED, which is reassuring.  We will be able to better assess once we have updated coronary CTA.

## 2021-08-07 NOTE — Telephone Encounter (Signed)
I have reviewed her ED visit.  Please schedule a coronary CTA.  Diagnosis precordial pain.  She can follow-up with me thereafter.

## 2021-08-09 ENCOUNTER — Telehealth (HOSPITAL_COMMUNITY): Payer: Self-pay | Admitting: *Deleted

## 2021-08-09 NOTE — Telephone Encounter (Signed)
Reaching out to patient to offer assistance regarding upcoming cardiac imaging study; pt verbalizes understanding of appt date/time, parking situation and where to check in, pre-test NPO status and medications ordered, and verified current allergies; name and call back number provided for further questions should they arise  Ana Schweitzer RN Navigator Cardiac Imaging [facility] [phone] office [phone] cell  Patient to take 100mg metoprolol tartrate and 15mg ivabradine two hours prior to her cardiac CT scan. 

## 2021-08-10 ENCOUNTER — Other Ambulatory Visit: Payer: Self-pay | Admitting: Physician Assistant

## 2021-08-10 ENCOUNTER — Ambulatory Visit
Admission: RE | Admit: 2021-08-10 | Discharge: 2021-08-10 | Disposition: A | Discharge status: Home / Self Care | Payer: Medicare Other | Source: Ambulatory Visit | Attending: Physician Assistant | Admitting: Physician Assistant

## 2021-08-10 DIAGNOSIS — R072 Precordial pain: Secondary | ICD-10-CM | POA: Diagnosis present

## 2021-08-10 DIAGNOSIS — R931 Abnormal findings on diagnostic imaging of heart and coronary circulation: Secondary | ICD-10-CM

## 2021-08-10 DIAGNOSIS — I2510 Atherosclerotic heart disease of native coronary artery without angina pectoris: Secondary | ICD-10-CM | POA: Diagnosis not present

## 2021-08-10 MED ORDER — METOPROLOL TARTRATE 5 MG/5ML IV SOLN
10.0000 mg | Freq: Once | INTRAVENOUS | Status: AC
  Administered 2021-08-10: 10 mg via INTRAVENOUS

## 2021-08-10 MED ORDER — IOHEXOL 350 MG/ML SOLN
100.0000 mL | Freq: Once | INTRAVENOUS | Status: AC | PRN
  Administered 2021-08-10: 100 mL via INTRAVENOUS

## 2021-08-10 MED ORDER — NITROGLYCERIN 0.4 MG SL SUBL
0.8000 mg | SUBLINGUAL_TABLET | Freq: Once | SUBLINGUAL | Status: AC
  Administered 2021-08-10: 0.8 mg via SUBLINGUAL

## 2021-08-10 NOTE — Progress Notes (Signed)
Patient tolerated CT well. Drank water after. Vital signs stable encourage to drink water throughout day.Reasons explained and verbalized understanding. Ambulated steady gait.  

## 2021-08-11 DIAGNOSIS — R931 Abnormal findings on diagnostic imaging of heart and coronary circulation: Secondary | ICD-10-CM | POA: Diagnosis not present

## 2021-08-14 ENCOUNTER — Other Ambulatory Visit: Payer: Self-pay | Admitting: *Deleted

## 2021-08-14 ENCOUNTER — Telehealth: Payer: Self-pay | Admitting: *Deleted

## 2021-08-14 MED ORDER — ISOSORBIDE MONONITRATE ER 30 MG PO TB24: 60.0000 mg | ORAL_TABLET | Freq: Every day | ORAL | 3 refills | Status: DC

## 2021-08-14 NOTE — Progress Notes (Signed)
Cardiology Office Note    Date:  08/18/2021   ID:  [name], DOB 13-Jun-1945, MRN [id number]  PCP:  Rusty Aus, MD  Cardiologist:  Ida Rogue, MD  Electrophysiologist:  Virl Axe, MD   Chief Complaint: Follow up  History of Present Illness:   [name] is a 76 y.o. female with history of CAD medically managed as outlined below, HFpEF, PAT/symptomatic bradycardia with syncope status post PPM in 02/2019, DM2, lymphedema with compression pumps, HLD intolerant to statins, CKD stage III, hypothyroidism, OSA on CPAP, chronic dyspnea, and depression who presents for follow-up of coronary CTA.   Remote cardiac cath from 07/2011 showed a left dominant system with moderate mid LAD, proximal D1, and proximal RCA disease estimated at 50% stenoses with normal LV systolic function.   She was evaluated for chest pain in 2019.  MPI was low risk.  Coronary CTA was equivocal, though concerning for LAD disease.  Subsequent LHC showed 80% ostial diagonal stenosis with 50% proximal/mid LAD stenosis.  Medical management was recommended.  In 02/2019, she underwent PPM implantation after experiencing syncope with loop monitoring showing prolonged bradycardia with pauses of up to 13 seconds.  She was admitted to the hospital in 08/2020 with chest pain and ruled out.  CTA chest was negative for PE.  Echo showed an EF of 60 to 65%, no regional wall motion abnormalities, grade 2 diastolic dysfunction, normal RV systolic function and ventricular cavity size, and no significant valvular abnormalities.  Lexiscan MPI at that time showed no significant ischemia with an EF of 67%.  CT attenuated corrected images showed mild aortic atherosclerosis and mild coronary artery calcification.  Overall, this was a low risk study.  She was most recently seen in the office on 08/04/2021 noting a 2-week history of constant substernal chest pain that was exacerbated with ambulation and associated with diaphoresis, dyspnea,  nausea, and flushing.  EKG showed sinus rhythm with nonspecific anterior ST-T changes.  Given ongoing chest pain, she was sent to the ED for further evaluation.  High sensitivity troponin 3 with a delta troponin of < 2.  She declined admission.  Imdur was titrated to 30 mg daily.  Subsequent coronary CTA on 08/10/2021 showed a calcium score of 897, which was the 92nd percentile. There of moderate calcified plaque in the proximal LAD and D1, estimated at 50-69% stenosis as well as mild proximal RCA and mid LCx calcified stenosis estimated at 25-49%. CT FFR was negative.  She comes in doing well from a cardiac perspective.  She has noted an improvement in her chest discomfort following the initiation and titration of Imdur.  However, she notes flushing and sweats with this medication.  She has self titrated ranolazine to 1000 mg twice daily and is tolerating well.  She also notes intermittent episodes of tachypalpitations as well as watch readings of bradycardic events.  Most recent PPM interrogation was stable.  She is a scheduled for interrogation later this month.   Labs independently reviewed: 07/2021 - HGB 11.9, PLT 226, potassium 3.7, BUN 20, SCr 1.32 06/2021 - albumin 4.3, AST/ALT normal 05/2021  - TSH normal, A1c 6.6, TC 120, TG 152, HDL 41, LDL 48  Past Medical History:  Diagnosis Date   Acquired elevated diaphragm    HIGHER ON RIGHT SIDE   Anemia    Anxiety    Aortic atherosclerosis (HCC)    Arthritis    CAD (coronary artery disease)    a. 07/2017 MV: Low risk;  b. 09/2017 Cath: LM nl, LAD 50p/m, D1 80ost, LCX large/nl, RCA small/nl.   Chronic back pain    stenosis.degenerative disc,some scoliosis   Chronic heart failure with preserved ejection fraction (HFpEF) ([place])    a. 07/2017 Echo: EF 60-65%, no rwma, gr2 DD, nl RV fxn.   Chronic kidney disease    stage 3   Complication of anesthesia 2018   aspirated during wrist surgery during LMA removal   Constipation    takes Stool Softener  daily   Coronary artery disease    Depression    takes Cymbalta daily   Diabetes mellitus    Type 2 diabetic. Average fasting blood sugar runs high 397-673   Diastolic CHF ([place]) 41/93/7902   Per patient, diagnosed in 2018   Diverticulosis    Dysplastic nevus 09/12/2017   L sup buttock - mild    Dysplastic nevus 09/18/2018   R sup med buttocks - mild    E coli infection    GERD (gastroesophageal reflux disease)    takes Nexium daily   Grade II diastolic dysfunction    Headache    Hemorrhoids    History of colon polyps    benign   History of gout    doesn't take any meds   History of hiatal hernia    small   History of vertigo    doesn't take any meds   Hyperlipidemia    takes Praluent daily   Hypertension    currently BP medications are on hold    Hypothyroidism    takes Synthroid daily   Insomnia    takes Restoril nightly   Muscle spasm    takes Robaxin as needed   NSVT (nonsustained ventricular tachycardia) (HCC)    OSA on CPAP    Paroxysmal atrial tachycardia ([place])    Peripheral vascular disease ([place])    AAA as stated per pt / was just discovered and pt states has not been referred to vascular MD    Presence of permanent cardiac pacemaker    Restless leg    takes Requip daily   Rosacea    Symptomatic bradycardia    a. 02/2019 s/p Biotronik [place] 8 DR-T DC PPM   Syncope    a. Documented bradycardia and pauses-->02/2019 s/p Biotronik [place] 8 DR-T DC PPM.   Varicose veins     Past Surgical History:  Procedure Laterality Date   ABDOMINAL HYSTERECTOMY     with BSo   CARDIAC CATHETERIZATION  2013   Normal   CARPAL TUNNEL RELEASE Bilateral    CATARACT EXTRACTION, BILATERAL     CHOLECYSTECTOMY     COLONOSCOPY     COLONOSCOPY WITH PROPOFOL N/A 11/25/2018   Procedure: COLONOSCOPY WITH PROPOFOL;  Surgeon: Toledo, Benay Pike, MD;  Location: [facility] ENDOSCOPY;  Service: Gastroenterology;  Laterality: N/A;   DIAGNOSTIC LAPAROSCOPY     multiple times   DILATION AND  CURETTAGE OF UTERUS     ESOPHAGOGASTRODUODENOSCOPY (EGD) WITH PROPOFOL N/A 11/01/2014   Procedure: ESOPHAGOGASTRODUODENOSCOPY (EGD) WITH PROPOFOL;  Surgeon: Hulen Luster, MD;  [facility]COPY;  Service: Gastroenterology;  Laterality: N/A;   ESOPHAGOGASTRODUODENOSCOPY (EGD) WITH PROPOFOL N/A 11/25/2018   Procedure: ESOPHAGOGASTRODUODENOSCOPY (EGD) WITH PROPOFOL;  Surgeon: Toledo, Benay Pike, MD;  Location: [facility] ENDOSCOPY;  Service: Gastroenterology;  Laterality: N/A;   HARDWARE REMOVAL Left 10/11/2016   Procedure: HARDWARE REMOVAL-LEFT RADIUS;  Surgeon: Lovell Sheehan, MD;  Location: [facility] ORS;  Service: Orthopedics;  Laterality: Left;  Left Radius Wrist  IMPLANTABLE CONTACT LENS IMPLANTATION     bilateral   KNEE ARTHROSCOPY Right 05/09/2020   Procedure: ARTHROSCOPY KNEE;  Surgeon: Dereck Leep, MD;  Location: [facility] ORS;  Service: Orthopedics;  Laterality: Right;   LAMINECTOMY  11/13/2015   LEFT HEART CATH AND CORONARY ANGIOGRAPHY Left 10/01/2017   Procedure: LEFT HEART CATH AND CORONARY ANGIOGRAPHY;  Surgeon: Nelva Bush, MD;  Location: [place] CV LAB;  Service: Cardiovascular;  Laterality: Left;   LOOP RECORDER INSERTION N/A 10/17/2017   Procedure: LOOP RECORDER INSERTION;  Surgeon: Deboraha Sprang, MD;  Location: [place] CV LAB;  Service: Cardiovascular;  Laterality: N/A;   LOOP RECORDER REMOVAL N/A 03/04/2019   Procedure: LOOP RECORDER REMOVAL;  Surgeon: Deboraha Sprang, MD;  Location: [place] CV LAB;  Service: Cardiovascular;  Laterality: N/A;   LUMBAR FUSION  11/2015   LUMBAR WOUND DEBRIDEMENT N/A 12/02/2015   Procedure: WOUND Exploration;  Surgeon: Consuella Lose, MD;  Location: [place];  Service: Neurosurgery;  Laterality: N/A;   OPEN REDUCTION INTERNAL FIXATION (ORIF) DISTAL RADIAL FRACTURE Left 08/29/2016   Procedure: OPEN REDUCTION INTERNAL FIXATION (ORIF) DISTAL RADIAL FRACTURE;  Surgeon: Lovell Sheehan, MD;  Location: [facility] ORS;  Service: Orthopedics;   Laterality: Left;   PACEMAKER IMPLANT N/A 03/04/2019   Procedure: PACEMAKER IMPLANT;  Surgeon: Deboraha Sprang, MD;  Location: [place] CV LAB;  Service: Cardiovascular;  Laterality: N/A;   PICC LINE PLACE PERIPHERAL ([place] HX)     right upper arm    ROTATOR CUFF REPAIR Right    SAVORY DILATION N/A 11/01/2014   Procedure: SAVORY DILATION;  Surgeon: Hulen Luster, MD;  Location: [facility] ENDOSCOPY;  Service: Gastroenterology;  Laterality: N/A;   TONSILLECTOMY     TRIGGER FINGER RELEASE Bilateral     Current Medications: Current Meds  Medication Sig   Acetaminophen (TYLENOL ARTHRITIS PAIN PO) Take 1,300 mg by mouth every 8 (eight) hours as needed (pain).   Alirocumab (PRALUENT) 150 MG/ML SOAJ Inject 150 mg into the skin every 14 (fourteen) days.   amLODipine (NORVASC) 5 MG tablet Take 1 tablet (5 mg total) by mouth daily.   aspirin 81 MG tablet Take 81 mg by mouth at bedtime.    atenolol (TENORMIN) 25 MG tablet Take 0.5 tablets (12.5 mg total) by mouth every morning.   Azelaic Acid (FINACEA) 15 % FOAM Apply 1 application. topically as directed. Qd to bid to aa face   azelastine (ASTELIN) 0.1 % nasal spray Place 2 sprays into both nostrils 2 (two) times daily as needed for rhinitis.   cholecalciferol (VITAMIN D3) 25 MCG (1000 UNIT) tablet Take 1,000 Units by mouth daily.   diclofenac Sodium (VOLTAREN) 1 % GEL Apply 2 g topically 2 (two) times daily.   esomeprazole (NEXIUM) 40 MG capsule Take 40 mg by mouth 2 (two) times daily.   gabapentin (NEURONTIN) 300 MG capsule Take 300 mg by mouth 2 (two) times daily.   Insulin Aspart (NOVOLOG FLEXPEN [place]) Inject 10 Units into the skin as needed.   LANTUS SOLOSTAR 100 UNIT/ML Solostar Pen Inject 28 Units into the skin every morning.   levothyroxine (SYNTHROID, LEVOTHROID) 75 MCG tablet Take 75 mcg by mouth daily before breakfast.    linagliptin (TRADJENTA) 5 MG TABS tablet Take 5 mg by mouth daily.   methocarbamol (ROBAXIN) 500 MG tablet Take 500 mg by mouth  every 8 (eight) hours as needed for muscle spasms.   mometasone (ELOCON) 0.1 % cream Apply 1 application topically daily as needed (  Rash). Qd to bid up to 5 days a week aa right lower leg prn itching   nitroGLYCERIN (NITROSTAT) 0.4 MG SL tablet Place 1 tablet (0.4 mg total) under the tongue every 5 (five) minutes as needed for chest pain.   ondansetron (ZOFRAN) 4 MG tablet Take 4 mg by mouth every 8 (eight) hours as needed for nausea or vomiting.    ondansetron (ZOFRAN-ODT) 4 MG disintegrating tablet Take 1 tablet (4 mg total) by mouth every 6 (six) hours as needed for nausea or vomiting.   potassium chloride (KLOR-CON) 10 MEQ tablet Take 2 tablets (20 mEq total) by mouth daily.   ranolazine (RANEXA) 500 MG 12 hr tablet Take 2 tablets (1,000 mg total) by mouth 2 (two) times daily.   ropinirole (REQUIP) 5 MG tablet Take 5 mg by mouth at bedtime.   temazepam (RESTORIL) 30 MG capsule Take 30 mg by mouth at bedtime.   torsemide (DEMADEX) 20 MG tablet Take 2 tablets (40 mg) by mouth once daily   traMADol (ULTRAM) 50 MG tablet Take 50 mg by mouth every 6 (six) hours as needed for moderate pain or severe pain.   trolamine salicylate (ASPERCREME) 10 % cream Apply 1 application topically at bedtime.   [DISCONTINUED] isosorbide mononitrate (IMDUR) 30 MG 24 hr tablet Take 2 tablets (60 mg total) by mouth daily.   [DISCONTINUED] ranolazine (RANEXA) 500 MG 12 hr tablet Take 2 tablets (1,000 mg total) by mouth 2 (two) times daily.    Allergies:   Ezetimibe, Metoclopramide, Propoxyphene, Hydrocodone, Tramadol, Ceftin [cefuroxime axetil], Codeine, Penicillins, Statins, and Vicodin [hydrocodone-acetaminophen]   Social History   Socioeconomic History   Marital status: Married    Spouse name: Not on file   Number of children: Not on file   Years of education: Not on file   Highest education level: Not on file  Occupational History   Occupation: retired  Tobacco Use   Smoking status: Never   Smokeless  tobacco: Never  Vaping Use   Vaping Use: Never used  Substance and Sexual Activity   Alcohol use: No   Drug use: No   Sexual activity: Not on file  Other Topics Concern   Not on file  Social History Narrative   Not on file   Social Determinants of Health   Financial Resource Strain: Not on file  Food Insecurity: Not on file  Transportation Needs: Not on file  Physical Activity: Not on file  Stress: Not on file  Social Connections: Not on file     Family History:  The patient's family history includes Breast cancer (age of onset: 58) in her mother; Heart attack in her sister; Heart disease in her brother, father, mother, and sister.  ROS:   12-point review of systems is negative unless otherwise noted in the HPI.   EKGs/Labs/Other Studies Reviewed:    Studies reviewed were summarized above. The additional studies were reviewed today:  Coronary CTA with CT FFR 08/10/2021: FINDINGS: Aorta: Normal size. Moderate ascending and descending aorta calcifications. No dissection.   Aortic Valve:  Trileaflet.  No calcifications.   Coronary Arteries:  Normal coronary origin.  Left dominance.   RCA is small non dominant artery. There is calcified plaque in the ostial RCA causing mild stenosis (25-49%).   Left main gives rise to LAD and LCX arteries.  LM has no disease.   LAD has calcified plaque proximally causing moderate stenosis (50-69%). Calcified plaque present in the first diagonal causing moderate stenosis (50-69%).  LCX is a dominant artery that gives rise to two obtuse marginal branches and also the PDA and PL branches. There is calcified plaque in the mid LCx causing mild stenosis (25%).   Other findings:   Normal pulmonary vein drainage into the left atrium.   Normal left atrial appendage without a thrombus.   Normal size of the pulmonary artery.   Pacemaker leads noted in the RA and RV.   IMPRESSION: 1. Coronary calcium score of 897. This was 92nd  percentile for age and sex matched control. 2. Normal coronary origin with left dominance. 3. Calcified plaque causing moderate proximal LAD and D1 stenosis (50-69%). 4. Calcified plaque causing mild proximal RCA and mid LCx stenosis (25-49%). 5. CAD-RADS 3. Moderate stenosis. Consider symptom-guided anti-ischemic pharmacotherapy as well as risk factor modification per guideline directed care. 6. Additional analysis with CT FFR will be submitted and reported separately.   1. Left Main:  No significant stenosis. 2. LAD: No significant stenosis.  FFRct 0.88 3. LCX: No significant stenosis.  FFRct 0.85 4. RCA: No significant stenosis.  FFRct 0.95   IMPRESSION: 1.  CT FFR analysis didn't show any significant stenosis. __________  Carlton Adam MPI 09/08/2020: Pharmacological myocardial perfusion imaging study with no significant  ischemia Normal wall motion, EF estimated at 67% No EKG changes concerning for ischemia at peak stress or in recovery. CT attenuation correction images with mild aortic atherosclerosis and mild coronary calcification  Low risk scan __________   2D echo 09/08/2020: 1. Left ventricular ejection fraction, by estimation, is 60 to 65%. The  left ventricle has normal function. The left ventricle has no regional  wall motion abnormalities. Left ventricular diastolic parameters are  consistent with Grade II diastolic  dysfunction (pseudonormalization).   2. Right ventricular systolic function is normal. The right ventricular  size is normal. Tricuspid regurgitation signal is inadequate for assessing  PA pressure. __________   LHC 10/01/2017: Conclusions: Moderate to severe single-vessel coronary artery disease with 50% proximal/mid LAD disease as well as 80% ostial D1 stenosis.  Both the LAD and diagonal branches are relatively small.  Large, dominant left circumflex and small nondominant right coronary artery without significant coronary artery disease. Moderately  elevated left ventricular filling pressure, consistent with diastolic heart failure.   Recommendations: Favor continued medical therapy, as PCI to the diagonal branch would be challenging given small size and ostial location. The patient was given furosemide 40 mg IV x1 after the procedure and instructed to restart torsemide 40 mg daily. Obtain basic metabolic panel in 1 week.  Follow-up with Dr. Caryl Comes or an APP in approximately 2 weeks. Consider work-up for infiltrative causes of diastolic heart failure and recent renal insufficiency, including amyloidosis. Recommend avoidance potentially nephrotoxic drugs including NSAIDs. __________   Coronary CTA with CT FFR 09/06/2017: IMPRESSION: 1. Unable to evaluate mid to distal LAD or D1, so cannot rule out severe stenosis. 2.  Suspect hemodynamically significant stenosis mid to distal PLOM.   Calcium Score: 193 Agatston units.   Coronary Arteries: Left dominant with no anomalies   LM: No plaque or stenosis.   LAD system: Extensive calcified plaque in the proximal to mid LAD and in the moderate D1. The LAD is poorly visualized beyond the proximal segment, as is D1. I am concerned about possible severe stenosis in both vessels.   Circumflex system: Large LCx. Calcified plaque in the proximal, mid and distal vessel with no more than mild stenosis. Provides left PDA.   RCA system: Small, nondominant vessel  without obstructive disease.   IMPRESSION: 1. Coronary artery calcium score 193 Agatston units, placing the patient in the 77th percentile for age and gender. This suggests intermediate risk for future cardiac events.   2. Extensive plaque in the LAD and D1. Poor opacification of both vessels beyond the proximal segment, I am concerned about severe stenosis in both vessels. However, findings could also be due to poor quality study. Will send for FFR. __________   2D echo 07/17/2017: - Left ventricle: The cavity size was normal.  Systolic function was    normal. The estimated ejection fraction was in the range of 60%    to 65%. Wall motion was normal; there were no regional wall    motion abnormalities. Features are consistent with a pseudonormal    left ventricular filling pattern, with concomitant abnormal    relaxation and increased filling pressure (grade 2 diastolic    dysfunction).  - Left atrium: The atrium was normal in size.  - Right ventricle: Systolic function was normal.  - Pulmonary arteries: Systolic pressure was within the normal    range. __________   Carlton Adam MPI 07/17/2017: Pharmacological myocardial perfusion imaging study with no significant  ischemia Normal wall motion, EF estimated at 85% No EKG changes concerning for ischemia at peak stress or in recovery. Low risk scan __________   2D echo 07/26/2016: - Procedure narrative: Transthoracic echocardiography. The study    was technically difficult.  - Left ventricle: The cavity size was normal. There was mild    concentric hypertrophy. Systolic function was normal. The    estimated ejection fraction was in the range of 60% to 65%. Wall    motion was normal; there were no regional wall motion    abnormalities. Doppler parameters are consistent with abnormal    left ventricular relaxation (grade 1 diastolic dysfunction).  - Left atrium: The atrium was normal in size.  - Right ventricle: Systolic function was normal.  - Pulmonary arteries: Systolic pressure could not be accurately    estimated.     Recommend Aspirin '81mg'$  daily for moderate CAD.   EKG:  EKG is ordered today.  The EKG ordered today demonstrates sinus tachycardia, 103 bpm, no acute ST-T changes, no significant changes compared to prior tracing  Recent Labs: 09/07/2020: B Natriuretic Peptide 54.6 05/06/2021: ALT 14 08/04/2021: BUN 20; Creatinine, Ser 1.32; Hemoglobin 11.9; Platelets 226; Potassium 3.7; Sodium 141  Recent Lipid Panel    Component Value Date/Time   CHOL 163  09/08/2020 0613   CHOL 163 07/26/2015 0802   TRIG 192 (H) 09/08/2020 0613   HDL 44 09/08/2020 0613   HDL 32 (L) 07/26/2015 0802   CHOLHDL 3.7 09/08/2020 0613   VLDL 38 09/08/2020 0613   LDLCALC 81 09/08/2020 0613   LDLCALC 94 07/26/2015 0802    PHYSICAL EXAM:    VS:  BP 140/70 (BP Location: Left Arm, Patient Position: Sitting, Cuff Size: Normal)   Pulse (!) 103   Ht '5\' 2"'$  (1.575 m)   Wt 207 lb 6 oz (94.1 kg)   SpO2 99%   BMI 37.93 kg/m   BMI: Body mass index is 37.93 kg/m.  Physical Exam Vitals reviewed.  Constitutional:      Appearance: She is well-developed.  HENT:     Head: Normocephalic and atraumatic.  Eyes:     General:        Right eye: No discharge.        Left eye: No discharge.  Neck:  Vascular: No JVD.  Cardiovascular:     Rate and Rhythm: Normal rate and regular rhythm.     Pulses:          Posterior tibial pulses are 2+ on the right side and 2+ on the left side.     Heart sounds: Normal heart sounds, S1 normal and S2 normal. Heart sounds not distant. No midsystolic click and no opening snap. No murmur heard.    No friction rub.  Pulmonary:     Effort: Pulmonary effort is normal. No respiratory distress.     Breath sounds: Normal breath sounds. No decreased breath sounds, wheezing or rales.  Chest:     Chest wall: No tenderness.  Abdominal:     General: There is no distension.  Musculoskeletal:     Cervical back: Normal range of motion.     Right lower leg: No edema.     Left lower leg: No edema.  Skin:    General: Skin is warm and dry.     Nails: There is no clubbing.  Neurological:     Mental Status: She is alert and oriented to person, place, and time.  Psychiatric:        Speech: Speech normal.        Behavior: Behavior normal.        Thought Content: Thought content normal.        Judgment: Judgment normal.     Wt Readings from Last 3 Encounters:  08/18/21 207 lb 6 oz (94.1 kg)  08/04/21 209 lb 7 oz (95 kg)  08/04/21 209 lb 12.8  oz (95.2 kg)     ASSESSMENT & PLAN:   CAD involving the native coronary arteries with stable angina: Coronary CTA showed nonobstructive disease with normal FFR as outlined above.  Symptoms of chest pain did improve on Imdur, however she reports intolerance to this medication with flushing and sweats.  She has gone off on ranolazine to 1000 mg twice daily and is tolerating this.  Discontinue Imdur secondary to intolerance.  Initiate amlodipine 5 mg twice daily for antianginal therapy.  She will otherwise continue aspirin, atenolol, and as needed SL NTG.  HFpEF: She appears euvolemic and well compensated.  Not requiring a standing diuretic.  Atrial tachycardia/symptomatic bradycardia/syncope/near syncope: Status post PPM with most recent device interrogation stable.  She does report episodes of tachycardia and bradycardia.  She is scheduled for device interrogation later this month.  Follow-up with EP as directed.  HTN: Blood pressure is mildly elevated in the office today, though improved from last clinic visit.  We are adding amlodipine as outlined above.  Otherwise, she will continue current medications.  HLD: LDL 48 in 05/2021.  She remains on Praluent.  Intolerant to statins and ezetimibe.  Lymphedema: Quiescent.  Has lymphedema pumps at home.   Disposition: F/u with Dr. Rockey Situ as scheduled next month, and EP as directed.   Medication Adjustments/Labs and Tests Ordered: Current medicines are reviewed at length with the patient today.  Concerns regarding medicines are outlined above. Medication changes, Labs and Tests ordered today are summarized above and listed in the Patient Instructions accessible in Encounters.   Signed, Christell Faith, PA-C 08/18/2021 8:45 AM     [address] [place], [place] [id number] [phone]

## 2021-08-14 NOTE — Telephone Encounter (Signed)
Patient returned call

## 2021-08-14 NOTE — Telephone Encounter (Signed)
Reviewed results and recommendations with patient. She has many concerns but will wait till her appointment on Friday to review with provider. She was agreeable to increase isosorbide mononitrate to 60 mg once daily and has enough to last until we see her later this week. She was appreciative for the call with no further questions at this time.

## 2021-08-14 NOTE — Telephone Encounter (Signed)
Left voicemail message to call back for review of results.  

## 2021-08-14 NOTE — Telephone Encounter (Signed)
-----   Message from Rise Mu, PA-C sent at 08/12/2021  7:09 AM EDT ----- Please inform the patient her coronary CTA showed an unchanged small hiatal hernia, a calcium score of 897 which was the 92nd percentile, and moderate narrowing of the LAD and D1 with mild narrowing of the RCA and LCx.  These narrowings are not so narrow that we need to pursue cardiac cath at this time.  Recommend aggressive risk factor modification.  If she would like, we can increase her Imdur to 60 mg daily.  She would otherwise continue current medical therapy and follow-up as planned to reassess symptoms.

## 2021-08-18 ENCOUNTER — Encounter: Payer: Self-pay | Admitting: Physician Assistant

## 2021-08-18 ENCOUNTER — Ambulatory Visit (INDEPENDENT_AMBULATORY_CARE_PROVIDER_SITE_OTHER): Payer: Medicare Other | Admitting: Physician Assistant

## 2021-08-18 VITALS — BP 140/70 | HR 103 | Ht 62.0 in | Wt 207.4 lb

## 2021-08-18 DIAGNOSIS — R001 Bradycardia, unspecified: Secondary | ICD-10-CM

## 2021-08-18 DIAGNOSIS — I5032 Chronic diastolic (congestive) heart failure: Secondary | ICD-10-CM

## 2021-08-18 DIAGNOSIS — Z950 Presence of cardiac pacemaker: Secondary | ICD-10-CM

## 2021-08-18 DIAGNOSIS — I10 Essential (primary) hypertension: Secondary | ICD-10-CM

## 2021-08-18 DIAGNOSIS — I455 Other specified heart block: Secondary | ICD-10-CM

## 2021-08-18 DIAGNOSIS — I25118 Atherosclerotic heart disease of native coronary artery with other forms of angina pectoris: Secondary | ICD-10-CM

## 2021-08-18 DIAGNOSIS — I25110 Atherosclerotic heart disease of native coronary artery with unstable angina pectoris: Secondary | ICD-10-CM

## 2021-08-18 DIAGNOSIS — R55 Syncope and collapse: Secondary | ICD-10-CM

## 2021-08-18 DIAGNOSIS — I890 Lymphedema, not elsewhere classified: Secondary | ICD-10-CM

## 2021-08-18 DIAGNOSIS — E785 Hyperlipidemia, unspecified: Secondary | ICD-10-CM

## 2021-08-18 DIAGNOSIS — I471 Supraventricular tachycardia: Secondary | ICD-10-CM

## 2021-08-18 DIAGNOSIS — I4719 Other supraventricular tachycardia: Secondary | ICD-10-CM

## 2021-08-18 MED ORDER — RANOLAZINE ER 500 MG PO TB12: 1000.0000 mg | ORAL_TABLET | Freq: Two times a day (BID) | ORAL | 3 refills | Status: DC

## 2021-08-18 MED ORDER — AMLODIPINE BESYLATE 5 MG PO TABS: 5.0000 mg | ORAL_TABLET | Freq: Every day | ORAL | 3 refills | Status: DC

## 2021-08-18 NOTE — Patient Instructions (Addendum)
Medication Instructions:  Your physician has recommended you make the following change in your medication:   STOP Isosorbide mononitrate (Imdur)  START Amlodipine 5 mg once a day Ranexa prescription updated  *If you need a refill on your cardiac medications before your next appointment, please call your pharmacy*   Lab Work: None  If you have labs (blood work) drawn today and your tests are completely normal, you will receive your results only by: Reading (if you have MyChart) OR A paper copy in the mail If you have any lab test that is abnormal or we need to change your treatment, we will call you to review the results.   Testing/Procedures: None   Follow-Up: At [facility], you and your health needs are our priority.  As part of our continuing mission to provide you with exceptional heart care, we have created designated Provider Care Teams.  These Care Teams include your primary Cardiologist (physician) and Advanced Practice Providers (APPs -  Physician Assistants and Nurse Practitioners) who all work together to provide you with the care you need, when you need it.   Your next appointment:   Keep scheduled appointment 09/25/21 at 11:40 am  The format for your next appointment:   In Person  Provider:   Ida Rogue, MD      Important Information About Sugar

## 2021-08-30 ENCOUNTER — Ambulatory Visit (INDEPENDENT_AMBULATORY_CARE_PROVIDER_SITE_OTHER): Payer: Medicare Other

## 2021-08-30 DIAGNOSIS — R55 Syncope and collapse: Secondary | ICD-10-CM

## 2021-08-31 LAB — CUP PACEART REMOTE DEVICE CHECK
Date Time Interrogation Session: 20230719072441
Implantable Lead Implant Date: 20210120
Implantable Lead Implant Date: 20210120
Implantable Lead Location: 753859
Implantable Lead Location: 753860
Implantable Lead Model: 5076
Implantable Lead Model: 5076
Implantable Pulse Generator Implant Date: 20210120
Pulse Gen Model: 407145
Pulse Gen Serial Number: 69765687

## 2021-09-24 NOTE — Progress Notes (Signed)
Date:  09/25/2021   ID:  [name], DOB 04/27/1945, MRN [id number]  Patient Location:  [place] [address]   Provider location:   [facility], [place] office  PCP:  Rusty Aus, MD  Cardiologist:  Arvid Right [facility]   Chief Complaint  Patient presents with   6 month follow up     Patient c/o LE edema, tachycardia/bradycardia, chest pain and shortness of breath. Medications reviewed by the patient verbally.     History of Present Illness:    [name] is a 76 y.o. female  past medical history of CAD,  cardiac catheterization in 2009 , June 2013, August 2019 moderate LAD, diagonal and RCA disease, Obesity,  diabetes,  hyperlipidemia with statin intolerance, on praluent obstructive sleep apnea on CPAP,   Tachycardia episodes,    leg edema,  lymphedema compression pumps seen in the hospital for severe hypertension and chest pain,  EF 60% in 07/2016 Loop monitor in place for syncope syncope with prolonged bradycardia more than 30 seconds.  There is at least 13 seconds of asystole with occasional scattered PVCs. Pacer, followed by Dr. Caryl Comes 02/2019  recurrent syncope assoc with nausea and vomiting- Back surgeries 2017, 2021, both with postoperative infection who presents for routine followup of her coronary artery disease and diastolic CHF  Last seen by myself in clinic September 2022 Seen by one of our providers August 18, 2021 Coronary CTA 07/2021 showed nonobstructive disease with normal FFR Comparison made to cardiac catheterization 2019, no dramatic changes Still with moderate disease in the LAD  Imdur 30 mg daily previous the held secondary to flushing Was started on amlodipine 5 twice daily, tolerating Ranexa 1000 twice daily Stopped amlodipine: "hot flashes"  Continues to have episodes of chest pain, also with some leg swelling Frequently taking torsemide 40 daily with potassium 10 daily, prescription is for 10  twice daily  Sedentary, difficulty moving around  Echo 7/22 reviewed  1. Left ventricular ejection fraction, by estimation, is 60 to 65%. The  left ventricle has normal function. The left ventricle has no regional  wall motion abnormalities. Left ventricular diastolic parameters are  consistent with Grade II diastolic  dysfunction (pseudonormalization).   2. Right ventricular systolic function is normal. The right ventricular  size is normal. Tricuspid regurgitation signal is inadequate for assessing  PA pressure.   Total chol 120, LDL 48, on Praluent A1C 6.6 Non smoker  Taking torsemide 40 daily for leg edema Sips "tea all day"  EKG personally reviewed by myself on todays visit Shows NSR rate 90 bpm no ST or T wave changes  Other past medical history reviewed Seen in the emergency room with admission July 2022 for angina Stress test performed showing low risk study, Echocardiogram with no acute findings,  It was felt symptoms from GI versus musculoskeletal etiology, unable to exclude stressors  Pacer downloads reviewed,  stable  Other past medical hx reviewed 05/16/2018 left arm pain, etiology unclear Went to the ER, Work up negative  Cath: 10/01/2017 Relatively small LAD with moderate mid vessel disease. Small D1 with 80% ostial/proximal stenosis. Large, dominant LCx without significant disease. Small, non-dominant RCA. Moderately elevated LVEDP. RECOMMENDATIONS: Medical therapy for LAD/diagonal disease.   syncope atrial tachycardia with documented  Pauses.  It was presumed that both events occurred while she was sleeping. --loop monitor placed   Hospitalization for chest pain July 17, 2017 Stress test showing no ischemia, ejection  fraction 85% Significant failure to thrive/weakness  Had back surgery 11/14/2015 Went to rehab Got postop infection, ecoli, sepsis  cardiac catheterization 07/25/2011 for chest pain   This showed left dominant coronary system with  moderate mid LAD, proximal diagonal #1 and proximal RCA disease all estimated at 50%, normal LV systolic function.   Past Medical History:  Diagnosis Date   Acquired elevated diaphragm    HIGHER ON RIGHT SIDE   Anemia    Anxiety    Aortic atherosclerosis (HCC)    Arthritis    CAD (coronary artery disease)    a. 07/2017 MV: Low risk; b. 09/2017 Cath: LM nl, LAD 50p/m, D1 80ost, LCX large/nl, RCA small/nl.   Chronic back pain    stenosis.degenerative disc,some scoliosis   Chronic heart failure with preserved ejection fraction (HFpEF) ([place])    a. 07/2017 Echo: EF 60-65%, no rwma, gr2 DD, nl RV fxn.   Chronic kidney disease    stage 3   Complication of anesthesia 2018   aspirated during wrist surgery during LMA removal   Constipation    takes Stool Softener daily   Coronary artery disease    Depression    takes Cymbalta daily   Diabetes mellitus    Type 2 diabetic. Average fasting blood sugar runs high 625-638   Diastolic CHF ([place]) 93/73/4287   Per patient, diagnosed in 2018   Diverticulosis    Dysplastic nevus 09/12/2017   L sup buttock - mild    Dysplastic nevus 09/18/2018   R sup med buttocks - mild    E coli infection    GERD (gastroesophageal reflux disease)    takes Nexium daily   Grade II diastolic dysfunction    Headache    Hemorrhoids    History of colon polyps    benign   History of gout    doesn't take any meds   History of hiatal hernia    small   History of vertigo    doesn't take any meds   Hyperlipidemia    takes Praluent daily   Hypertension    currently BP medications are on hold    Hypothyroidism    takes Synthroid daily   Insomnia    takes Restoril nightly   Muscle spasm    takes Robaxin as needed   NSVT (nonsustained ventricular tachycardia) (HCC)    OSA on CPAP    Paroxysmal atrial tachycardia ([place])    Peripheral vascular disease ([place])    AAA as stated per pt / was just discovered and pt states has not been referred to vascular MD     Presence of permanent cardiac pacemaker    Restless leg    takes Requip daily   Rosacea    Symptomatic bradycardia    a. 02/2019 s/p Biotronik [place] 8 DR-T DC PPM   Syncope    a. Documented bradycardia and pauses-->02/2019 s/p Biotronik [place] 8 DR-T DC PPM.   Varicose veins    Past Surgical History:  Procedure Laterality Date   ABDOMINAL HYSTERECTOMY     with BSo   CARDIAC CATHETERIZATION  2013   Normal   CARPAL TUNNEL RELEASE Bilateral    CATARACT EXTRACTION, BILATERAL     CHOLECYSTECTOMY     COLONOSCOPY     COLONOSCOPY WITH PROPOFOL N/A 11/25/2018   Procedure: COLONOSCOPY WITH PROPOFOL;  Surgeon: Toledo, Benay Pike, MD;  Location: [facility] ENDOSCOPY;  Service: Gastroenterology;  Laterality: N/A;   DIAGNOSTIC LAPAROSCOPY     multiple times  DILATION AND CURETTAGE OF UTERUS     ESOPHAGOGASTRODUODENOSCOPY (EGD) WITH PROPOFOL N/A 11/01/2014   Procedure: ESOPHAGOGASTRODUODENOSCOPY (EGD) WITH PROPOFOL;  Surgeon: Hulen Luster, MD;  Location: [facility] ENDOSCOPY;  Service: Gastroenterology;  Laterality: N/A;   ESOPHAGOGASTRODUODENOSCOPY (EGD) WITH PROPOFOL N/A 11/25/2018   Procedure: ESOPHAGOGASTRODUODENOSCOPY (EGD) WITH PROPOFOL;  Surgeon: Toledo, Benay Pike, MD;  Location: [facility] ENDOSCOPY;  Service: Gastroenterology;  Laterality: N/A;   HARDWARE REMOVAL Left 10/11/2016   Procedure: HARDWARE REMOVAL-LEFT RADIUS;  Surgeon: Lovell Sheehan, MD;  Location: [facility] ORS;  Service: Orthopedics;  Laterality: Left;  Left Radius Wrist    IMPLANTABLE CONTACT LENS IMPLANTATION     bilateral   KNEE ARTHROSCOPY Right 05/09/2020   Procedure: ARTHROSCOPY KNEE;  Surgeon: Dereck Leep, MD;  Location: [facility] ORS;  Service: Orthopedics;  Laterality: Right;   LAMINECTOMY  11/13/2015   LEFT HEART CATH AND CORONARY ANGIOGRAPHY Left 10/01/2017   Procedure: LEFT HEART CATH AND CORONARY ANGIOGRAPHY;  Surgeon: Nelva Bush, MD;  Location: [place] CV LAB;  Service: Cardiovascular;  Laterality: Left;   LOOP RECORDER  INSERTION N/A 10/17/2017   Procedure: LOOP RECORDER INSERTION;  Surgeon: Deboraha Sprang, MD;  Location: [place] CV LAB;  Service: Cardiovascular;  Laterality: N/A;   LOOP RECORDER REMOVAL N/A 03/04/2019   Procedure: LOOP RECORDER REMOVAL;  Surgeon: Deboraha Sprang, MD;  Location: [place] CV LAB;  Service: Cardiovascular;  Laterality: N/A;   LUMBAR FUSION  11/2015   LUMBAR WOUND DEBRIDEMENT N/A 12/02/2015   Procedure: WOUND Exploration;  Surgeon: Consuella Lose, MD;  Location: [place];  Service: Neurosurgery;  Laterality: N/A;   OPEN REDUCTION INTERNAL FIXATION (ORIF) DISTAL RADIAL FRACTURE Left 08/29/2016   Procedure: OPEN REDUCTION INTERNAL FIXATION (ORIF) DISTAL RADIAL FRACTURE;  Surgeon: Lovell Sheehan, MD;  Location: [facility] ORS;  Service: Orthopedics;  Laterality: Left;   PACEMAKER IMPLANT N/A 03/04/2019   Procedure: PACEMAKER IMPLANT;  Surgeon: Deboraha Sprang, MD;  Location: [place] CV LAB;  Service: Cardiovascular;  Laterality: N/A;   PICC LINE PLACE PERIPHERAL ([place] HX)     right upper arm    ROTATOR CUFF REPAIR Right    SAVORY DILATION N/A 11/01/2014   Procedure: SAVORY DILATION;  Surgeon: Hulen Luster, MD;  Location: [facility] ENDOSCOPY;  Service: Gastroenterology;  Laterality: N/A;   TONSILLECTOMY     TRIGGER FINGER RELEASE Bilateral      Current Meds  Medication Sig   Acetaminophen (TYLENOL ARTHRITIS PAIN PO) Take 1,300 mg by mouth every 8 (eight) hours as needed (pain).   Alirocumab (PRALUENT) 150 MG/ML SOAJ Inject 150 mg into the skin every 14 (fourteen) days.   aspirin 81 MG tablet Take 81 mg by mouth at bedtime.    Azelaic Acid (FINACEA) 15 % FOAM Apply 1 application. topically as directed. Qd to bid to aa face   azelastine (ASTELIN) 0.1 % nasal spray Place 2 sprays into both nostrils 2 (two) times daily as needed for rhinitis.   cholecalciferol (VITAMIN D3) 25 MCG (1000 UNIT) tablet Take 1,000 Units by mouth daily.   diclofenac Sodium (VOLTAREN) 1 % GEL Apply 2 g  topically 2 (two) times daily.   esomeprazole (NEXIUM) 40 MG capsule Take 40 mg by mouth 2 (two) times daily.   gabapentin (NEURONTIN) 300 MG capsule Take 300 mg by mouth 2 (two) times daily.   Insulin Aspart (NOVOLOG FLEXPEN [place]) Inject 10 Units into the skin as needed.   LANTUS SOLOSTAR 100 UNIT/ML Solostar Pen Inject 28 Units into  the skin every morning.   levothyroxine (SYNTHROID, LEVOTHROID) 75 MCG tablet Take 75 mcg by mouth daily before breakfast.    linagliptin (TRADJENTA) 5 MG TABS tablet Take 5 mg by mouth daily.   methocarbamol (ROBAXIN) 500 MG tablet Take 500 mg by mouth every 8 (eight) hours as needed for muscle spasms.   mometasone (ELOCON) 0.1 % cream Apply 1 application topically daily as needed ([place]). Qd to bid up to 5 days a week aa right lower leg prn itching   nitroGLYCERIN (NITROSTAT) 0.4 MG SL tablet Place 1 tablet (0.4 mg total) under the tongue every 5 (five) minutes as needed for chest pain.   ondansetron (ZOFRAN) 4 MG tablet Take 4 mg by mouth every 8 (eight) hours as needed for nausea or vomiting.    ondansetron (ZOFRAN-ODT) 4 MG disintegrating tablet Take 1 tablet (4 mg total) by mouth every 6 (six) hours as needed for nausea or vomiting.   potassium chloride (KLOR-CON) 10 MEQ tablet Take 2 tablets (20 mEq total) by mouth daily.   ranolazine (RANEXA) 500 MG 12 hr tablet Take 2 tablets (1,000 mg total) by mouth 2 (two) times daily.   ropinirole (REQUIP) 5 MG tablet Take 5 mg by mouth at bedtime.   temazepam (RESTORIL) 30 MG capsule Take 30 mg by mouth at bedtime.   torsemide (DEMADEX) 20 MG tablet Take 2 tablets (40 mg) by mouth once daily   traMADol (ULTRAM) 50 MG tablet Take 50 mg by mouth every 6 (six) hours as needed for moderate pain or severe pain.   trolamine salicylate (ASPERCREME) 10 % cream Apply 1 application topically at bedtime.     Allergies:   Ezetimibe, Hydrocodone-acetaminophen, Metoclopramide, Propoxyphene, Cefuroxime, Hydrocodone, Tramadol, Ceftin  [cefuroxime axetil], Codeine, Penicillins, Statins, and Vicodin [hydrocodone-acetaminophen]   Social History   Tobacco Use   Smoking status: Never   Smokeless tobacco: Never  Vaping Use   Vaping Use: Never used  Substance Use Topics   Alcohol use: No   Drug use: No     Family Hx: The patient's family history includes Breast cancer (age of onset: 67) in her mother; Heart attack in her sister; Heart disease in her brother, father, mother, and sister.  ROS:   Please see the history of present illness.    Review of Systems  Constitutional: Negative.   HENT: Negative.    Respiratory: Negative.    Cardiovascular: Negative.   Gastrointestinal: Negative.   Musculoskeletal:  Positive for back pain.  Neurological: Negative.   Psychiatric/Behavioral: Negative.    All other systems reviewed and are negative.    Labs/Other Tests and Data Reviewed:    Recent Labs: 05/06/2021: ALT 14 08/04/2021: BUN 20; Creatinine, Ser 1.32; Hemoglobin 11.9; Platelets 226; Potassium 3.7; Sodium 141   Recent Lipid Panel Lab Results  Component Value Date/Time   CHOL 163 09/08/2020 06:13 AM   CHOL 163 07/26/2015 08:02 AM   TRIG 192 (H) 09/08/2020 06:13 AM   HDL 44 09/08/2020 06:13 AM   HDL 32 (L) 07/26/2015 08:02 AM   CHOLHDL 3.7 09/08/2020 06:13 AM   LDLCALC 81 09/08/2020 06:13 AM   LDLCALC 94 07/26/2015 08:02 AM    Wt Readings from Last 3 Encounters:  09/25/21 213 lb 6 oz (96.8 kg)  08/18/21 207 lb 6 oz (94.1 kg)  08/04/21 209 lb 7 oz (95 kg)     Exam:    Vital Signs: Vital signs may also be detailed in the HPI BP (!) 150/70 (BP Location: Left  Arm, Patient Position: Sitting, Cuff Size: Large)   Pulse 90   Ht '5\' 1"'$  (1.549 m)   Wt 213 lb 6 oz (96.8 kg)   SpO2 98%   BMI 40.32 kg/m   Constitutional:  oriented to person, place, and time. No distress.  HENT:  Head: Grossly normal Eyes:  no discharge. No scleral icterus.  Neck: No JVD, no carotid bruits  Cardiovascular: Regular rate and  rhythm, no murmurs appreciated Pulmonary/Chest: Clear to auscultation bilaterally, no wheezes or rails Abdominal: Soft.  no distension.  no tenderness.  Musculoskeletal: Normal range of motion Neurological:  normal muscle tone. Coordination normal. No atrophy Skin: Skin warm and dry Psychiatric: normal affect, pleasant  ASSESSMENT & PLAN:    Atherosclerosis of native coronary artery with stable angina pectoris, unspecified whether native or transplanted heart [facility]) Currently with chronic anginal sx. No further workup at this time. Cardiac CTA reviewed.  Compared to prior catheterization in 2019 continue current medication regimen. ranexa to 1000 BID Blood pressure elevated Willing to retry imdur 15 daily  (had no flushing on low-dose ) and atenolol 12.5 daily  Type 2 diabetes mellitus with stage 3 chronic kidney disease, with long-term current use of insulin ([place]) We have encouraged continued exercise, careful diet management in an effort to lose weight. Limited by back pain  Atrial tachycardia ([place]) Followed by Dr. Therisa Doyne downloads reviewed Recommend she restart low-dose atenolol 12.5 daily given high blood pressure Periodic low heart rate per her Apple Watch, will hold off on further titration upward  Chronic diastolic heart failure ([place]) Has increased torsemide from 20 up to 40 on her own, Discussed balance with chronic renal insufficiency and overdiuresis  Sinus node dysfunction (HCC) Followed by Dr. Caryl Comes Has pacer  Lymphedema Stable sx Previously required compression pumps,  Weight loss recommended Has been taking higher doses of torsemide 40 daily Discussed need to limit overuse of torsemide given chronic renal insufficiency  Essential hypertension Blood pressure elevated, will restart low-dose atenolol as above, low-dose isosorbide as above We have recommended she closely monitor blood pressure and call us with numbers  OSA on CPAP Continues to use her  CPAP  Syncope Pacemaker in place Previous episodes in the setting of nausea vomiting, vasovagal No recent sx  CRI Discussed, avoid NSAIDs  Recommend she try to avoid overdiuresis with too much torsemide    Total encounter time more than 30 minutes  Greater than 50% was spent in counseling and coordination of care with the patient    Signed, Ida Rogue, MD  09/25/2021 11:38 AM    [place] [facility] [address], [place], [place] [address]

## 2021-09-25 ENCOUNTER — Ambulatory Visit (INDEPENDENT_AMBULATORY_CARE_PROVIDER_SITE_OTHER): Payer: Medicare Other | Admitting: Cardiovascular Disease

## 2021-09-25 ENCOUNTER — Encounter: Payer: Self-pay | Admitting: Cardiovascular Disease

## 2021-09-25 VITALS — BP 150/70 | HR 90 | Ht 61.0 in | Wt 213.4 lb

## 2021-09-25 DIAGNOSIS — I25118 Atherosclerotic heart disease of native coronary artery with other forms of angina pectoris: Secondary | ICD-10-CM

## 2021-09-25 DIAGNOSIS — R001 Bradycardia, unspecified: Secondary | ICD-10-CM

## 2021-09-25 DIAGNOSIS — I471 Supraventricular tachycardia: Secondary | ICD-10-CM

## 2021-09-25 DIAGNOSIS — R55 Syncope and collapse: Secondary | ICD-10-CM

## 2021-09-25 DIAGNOSIS — Z950 Presence of cardiac pacemaker: Secondary | ICD-10-CM

## 2021-09-25 DIAGNOSIS — I5032 Chronic diastolic (congestive) heart failure: Secondary | ICD-10-CM | POA: Diagnosis not present

## 2021-09-25 DIAGNOSIS — I890 Lymphedema, not elsewhere classified: Secondary | ICD-10-CM

## 2021-09-25 DIAGNOSIS — I455 Other specified heart block: Secondary | ICD-10-CM

## 2021-09-25 DIAGNOSIS — I25110 Atherosclerotic heart disease of native coronary artery with unstable angina pectoris: Secondary | ICD-10-CM | POA: Diagnosis not present

## 2021-09-25 DIAGNOSIS — I10 Essential (primary) hypertension: Secondary | ICD-10-CM

## 2021-09-25 DIAGNOSIS — E785 Hyperlipidemia, unspecified: Secondary | ICD-10-CM

## 2021-09-25 MED ORDER — ISOSORBIDE MONONITRATE ER 30 MG PO TB24: 15.0000 mg | ORAL_TABLET | Freq: Every day | ORAL | 3 refills | Status: DC

## 2021-09-25 MED ORDER — ATENOLOL 25 MG PO TABS: 12.5000 mg | ORAL_TABLET | ORAL | 3 refills | Status: DC

## 2021-09-25 NOTE — Progress Notes (Signed)
Remote pacemaker transmission.   

## 2021-09-25 NOTE — Patient Instructions (Addendum)
Medication Instructions:  Please restart isosorbide 15 mg daily Please restart 12.5 mg atenolol  Please call with blood pressure numbers  If you need a refill on your cardiac medications before your next appointment, please call your pharmacy.   Lab work: No new labs needed  Testing/Procedures: No new testing needed  Follow-Up: At [facility], you and your health needs are our priority.  As part of our continuing mission to provide you with exceptional heart care, we have created designated Provider Care Teams.  These Care Teams include your primary Cardiologist (physician) and Advanced Practice Providers (APPs -  Physician Assistants and Nurse Practitioners) who all work together to provide you with the care you need, when you need it.  You will need a follow up appointment in 6 months, APP OK  Providers on your designated Care Team:   Murray Hodgkins, NP Christell Faith, PA-C Cadence Kathlen Mody, [place]  COVID-19 Vaccine Information can be found at: [email or web address] For questions related to vaccine distribution or appointments, please email [email or web address] or call [phone].

## 2021-11-28 LAB — CUP PACEART REMOTE DEVICE CHECK
Date Time Interrogation Session: 20231013102436
Implantable Lead Implant Date: 20210120
Implantable Lead Implant Date: 20210120
Implantable Lead Location: 753859
Implantable Lead Location: 753860
Implantable Lead Model: 5076
Implantable Lead Model: 5076
Implantable Pulse Generator Implant Date: 20210120
Pulse Gen Model: 407145
Pulse Gen Serial Number: 69765687

## 2021-11-29 ENCOUNTER — Ambulatory Visit (INDEPENDENT_AMBULATORY_CARE_PROVIDER_SITE_OTHER): Payer: Medicare Other

## 2021-11-29 DIAGNOSIS — R001 Bradycardia, unspecified: Secondary | ICD-10-CM

## 2021-12-12 NOTE — Progress Notes (Signed)
Remote pacemaker transmission.   

## 2021-12-24 ENCOUNTER — Inpatient Hospital Stay
Admission: EM | Admit: 2021-12-24 | Discharge: 2021-12-27 | DRG: 286 | Disposition: A | Discharge status: Home Health | Payer: Medicare Other | Attending: Internal Medicine | Admitting: Internal Medicine

## 2021-12-24 ENCOUNTER — Emergency Department: Payer: Medicare Other

## 2021-12-24 DIAGNOSIS — I25110 Atherosclerotic heart disease of native coronary artery with unstable angina pectoris: Secondary | ICD-10-CM | POA: Diagnosis not present

## 2021-12-24 DIAGNOSIS — Z79899 Other long term (current) drug therapy: Secondary | ICD-10-CM

## 2021-12-24 DIAGNOSIS — R0600 Dyspnea, unspecified: Secondary | ICD-10-CM

## 2021-12-24 DIAGNOSIS — F32A Depression, unspecified: Secondary | ICD-10-CM | POA: Diagnosis present

## 2021-12-24 DIAGNOSIS — I5033 Acute on chronic diastolic (congestive) heart failure: Secondary | ICD-10-CM | POA: Diagnosis present

## 2021-12-24 DIAGNOSIS — I10 Essential (primary) hypertension: Secondary | ICD-10-CM | POA: Diagnosis present

## 2021-12-24 DIAGNOSIS — G2581 Restless legs syndrome: Secondary | ICD-10-CM | POA: Diagnosis present

## 2021-12-24 DIAGNOSIS — Z950 Presence of cardiac pacemaker: Secondary | ICD-10-CM

## 2021-12-24 DIAGNOSIS — E662 Morbid (severe) obesity with alveolar hypoventilation: Secondary | ICD-10-CM | POA: Diagnosis present

## 2021-12-24 DIAGNOSIS — I130 Hypertensive heart and chronic kidney disease with heart failure and stage 1 through stage 4 chronic kidney disease, or unspecified chronic kidney disease: Secondary | ICD-10-CM | POA: Diagnosis present

## 2021-12-24 DIAGNOSIS — Z7984 Long term (current) use of oral hypoglycemic drugs: Secondary | ICD-10-CM

## 2021-12-24 DIAGNOSIS — I2510 Atherosclerotic heart disease of native coronary artery without angina pectoris: Secondary | ICD-10-CM | POA: Diagnosis present

## 2021-12-24 DIAGNOSIS — R0602 Shortness of breath: Principal | ICD-10-CM

## 2021-12-24 DIAGNOSIS — Z6841 Body Mass Index (BMI) 40.0 and over, adult: Secondary | ICD-10-CM

## 2021-12-24 DIAGNOSIS — E785 Hyperlipidemia, unspecified: Secondary | ICD-10-CM | POA: Diagnosis present

## 2021-12-24 DIAGNOSIS — Z7982 Long term (current) use of aspirin: Secondary | ICD-10-CM

## 2021-12-24 DIAGNOSIS — E039 Hypothyroidism, unspecified: Secondary | ICD-10-CM | POA: Diagnosis present

## 2021-12-24 DIAGNOSIS — R5381 Other malaise: Secondary | ICD-10-CM | POA: Diagnosis present

## 2021-12-24 DIAGNOSIS — Z79890 Hormone replacement therapy (postmenopausal): Secondary | ICD-10-CM

## 2021-12-24 DIAGNOSIS — Z803 Family history of malignant neoplasm of breast: Secondary | ICD-10-CM

## 2021-12-24 DIAGNOSIS — G4733 Obstructive sleep apnea (adult) (pediatric): Secondary | ICD-10-CM

## 2021-12-24 DIAGNOSIS — I200 Unstable angina: Secondary | ICD-10-CM

## 2021-12-24 DIAGNOSIS — Z8249 Family history of ischemic heart disease and other diseases of the circulatory system: Secondary | ICD-10-CM

## 2021-12-24 DIAGNOSIS — J984 Other disorders of lung: Secondary | ICD-10-CM | POA: Diagnosis present

## 2021-12-24 DIAGNOSIS — N179 Acute kidney failure, unspecified: Secondary | ICD-10-CM | POA: Diagnosis present

## 2021-12-24 DIAGNOSIS — Z90710 Acquired absence of both cervix and uterus: Secondary | ICD-10-CM

## 2021-12-24 DIAGNOSIS — Z794 Long term (current) use of insulin: Secondary | ICD-10-CM

## 2021-12-24 DIAGNOSIS — E1165 Type 2 diabetes mellitus with hyperglycemia: Secondary | ICD-10-CM | POA: Diagnosis present

## 2021-12-24 DIAGNOSIS — Z880 Allergy status to penicillin: Secondary | ICD-10-CM

## 2021-12-24 DIAGNOSIS — E1122 Type 2 diabetes mellitus with diabetic chronic kidney disease: Secondary | ICD-10-CM | POA: Diagnosis present

## 2021-12-24 DIAGNOSIS — T380X5A Adverse effect of glucocorticoids and synthetic analogues, initial encounter: Secondary | ICD-10-CM | POA: Diagnosis present

## 2021-12-24 DIAGNOSIS — E1151 Type 2 diabetes mellitus with diabetic peripheral angiopathy without gangrene: Secondary | ICD-10-CM | POA: Diagnosis present

## 2021-12-24 DIAGNOSIS — I5032 Chronic diastolic (congestive) heart failure: Secondary | ICD-10-CM | POA: Insufficient documentation

## 2021-12-24 DIAGNOSIS — N1831 Chronic kidney disease, stage 3a: Secondary | ICD-10-CM | POA: Diagnosis present

## 2021-12-24 DIAGNOSIS — D72828 Other elevated white blood cell count: Secondary | ICD-10-CM | POA: Diagnosis present

## 2021-12-24 DIAGNOSIS — Z885 Allergy status to narcotic agent status: Secondary | ICD-10-CM

## 2021-12-24 DIAGNOSIS — K219 Gastro-esophageal reflux disease without esophagitis: Secondary | ICD-10-CM | POA: Diagnosis present

## 2021-12-24 LAB — COMPREHENSIVE METABOLIC PANEL
ALT: 17 U/L (ref 0–44)
AST: 17 U/L (ref 15–41)
Albumin: 4 g/dL (ref 3.5–5.0)
Alkaline Phosphatase: 141 U/L — ABNORMAL HIGH (ref 38–126)
Anion gap: 8 (ref 5–15)
BUN: 24 mg/dL — ABNORMAL HIGH (ref 8–23)
CO2: 30 mmol/L (ref 22–32)
Calcium: 9.3 mg/dL (ref 8.9–10.3)
Chloride: 100 mmol/L (ref 98–111)
Creatinine, Ser: 1.39 mg/dL — ABNORMAL HIGH (ref 0.44–1.00)
GFR, Estimated: 39 mL/min — ABNORMAL LOW (ref >60)
Glucose, Bld: 209 mg/dL — ABNORMAL HIGH (ref 70–99)
Potassium: 3.8 mmol/L (ref 3.5–5.1)
Sodium: 138 mmol/L (ref 135–145)
Total Bilirubin: 0.6 mg/dL (ref 0.3–1.2)
Total Protein: 7.4 g/dL (ref 6.5–8.1)

## 2021-12-24 LAB — CBC
HCT: 37 % (ref 36.0–46.0)
Hemoglobin: 11.6 g/dL — ABNORMAL LOW (ref 12.0–15.0)
MCH: 26.4 pg (ref 26.0–34.0)
MCHC: 31.4 g/dL (ref 30.0–36.0)
MCV: 84.1 fL (ref 80.0–100.0)
Platelets: 249 10*3/uL (ref 150–400)
RBC: 4.4 MIL/uL (ref 3.87–5.11)
RDW: 15.6 % — ABNORMAL HIGH (ref 11.5–15.5)
WBC: 7.3 10*3/uL (ref 4.0–10.5)
nRBC: 0 % (ref 0.0–0.2)

## 2021-12-24 LAB — TROPONIN I (HIGH SENSITIVITY)
Troponin I (High Sensitivity): 4 ng/L (ref <18)
Troponin I (High Sensitivity): 3 ng/L (ref <18)

## 2021-12-24 LAB — BRAIN NATRIURETIC PEPTIDE: B Natriuretic Peptide: 21.3 pg/mL (ref 0.0–100.0)

## 2021-12-24 LAB — CBG MONITORING, ED: Glucose-Capillary: 141 mg/dL — ABNORMAL HIGH (ref 70–99)

## 2021-12-24 LAB — D-DIMER, QUANTITATIVE: D-Dimer, Quant: 0.44 ug/mL-FEU (ref 0.00–0.50)

## 2021-12-24 MED ORDER — POTASSIUM CHLORIDE CRYS ER 20 MEQ PO TBCR
20.0000 meq | EXTENDED_RELEASE_TABLET | Freq: Every day | ORAL | Status: DC
  Administered 2021-12-25 – 2021-12-27 (×3): 20 meq via ORAL
  Filled 2021-12-24 (×3): qty 1

## 2021-12-24 MED ORDER — ASPIRIN 81 MG PO TBEC: 81.0000 mg | DELAYED_RELEASE_TABLET | Freq: Every day | ORAL | Status: DC

## 2021-12-24 MED ORDER — INSULIN ASPART 100 UNIT/ML IJ SOLN
0.0000 [IU] | Freq: Three times a day (TID) | INTRAMUSCULAR | Status: DC
  Administered 2021-12-25 (×3): 7 [IU] via SUBCUTANEOUS
  Administered 2021-12-26: 15 [IU] via SUBCUTANEOUS
  Administered 2021-12-26: 7 [IU] via SUBCUTANEOUS
  Administered 2021-12-27: 4 [IU] via SUBCUTANEOUS
  Administered 2021-12-27: 3 [IU] via SUBCUTANEOUS
  Filled 2021-12-24 (×7): qty 1

## 2021-12-24 MED ORDER — LEVOTHYROXINE SODIUM 50 MCG PO TABS
75.0000 ug | ORAL_TABLET | Freq: Every day | ORAL | Status: DC
  Administered 2021-12-25 – 2021-12-27 (×3): 75 ug via ORAL
  Filled 2021-12-24: qty 2
  Filled 2021-12-24 (×2): qty 1

## 2021-12-24 MED ORDER — ACETAMINOPHEN 325 MG PO TABS
650.0000 mg | ORAL_TABLET | ORAL | Status: DC | PRN
  Administered 2021-12-25: 650 mg via ORAL
  Filled 2021-12-24: qty 2

## 2021-12-24 MED ORDER — IPRATROPIUM-ALBUTEROL 0.5-2.5 (3) MG/3ML IN SOLN
3.0000 mL | Freq: Once | RESPIRATORY_TRACT | Status: AC
  Administered 2021-12-24: 3 mL via RESPIRATORY_TRACT
  Filled 2021-12-24: qty 3

## 2021-12-24 MED ORDER — METHYLPREDNISOLONE SODIUM SUCC 125 MG IJ SOLR
125.0000 mg | Freq: Once | INTRAMUSCULAR | Status: AC
  Administered 2021-12-24: 125 mg via INTRAVENOUS
  Filled 2021-12-24: qty 2

## 2021-12-24 MED ORDER — METHYLPREDNISOLONE SODIUM SUCC 40 MG IJ SOLR
40.0000 mg | Freq: Two times a day (BID) | INTRAMUSCULAR | Status: AC
  Administered 2021-12-25 (×2): 40 mg via INTRAVENOUS
  Filled 2021-12-24 (×2): qty 1

## 2021-12-24 MED ORDER — PREDNISONE 20 MG PO TABS
40.0000 mg | ORAL_TABLET | Freq: Every day | ORAL | Status: DC
  Administered 2021-12-26 – 2021-12-27 (×2): 40 mg via ORAL
  Filled 2021-12-24 (×2): qty 2

## 2021-12-24 MED ORDER — NITROGLYCERIN 0.4 MG SL SUBL: 0.4000 mg | SUBLINGUAL_TABLET | SUBLINGUAL | Status: DC | PRN

## 2021-12-24 MED ORDER — TEMAZEPAM 15 MG PO CAPS
30.0000 mg | ORAL_CAPSULE | Freq: Every day | ORAL | Status: DC
  Administered 2021-12-24 – 2021-12-26 (×3): 30 mg via ORAL
  Filled 2021-12-24 (×3): qty 2

## 2021-12-24 MED ORDER — TORSEMIDE 20 MG PO TABS
20.0000 mg | ORAL_TABLET | Freq: Every day | ORAL | Status: DC
  Administered 2021-12-25 – 2021-12-26 (×2): 20 mg via ORAL
  Filled 2021-12-24 (×2): qty 1

## 2021-12-24 MED ORDER — ASPIRIN 81 MG PO CHEW
81.0000 mg | CHEWABLE_TABLET | Freq: Every day | ORAL | Status: DC
  Administered 2021-12-25 – 2021-12-26 (×2): 81 mg via ORAL
  Filled 2021-12-24 (×3): qty 1

## 2021-12-24 MED ORDER — ISOSORBIDE MONONITRATE ER 30 MG PO TB24
15.0000 mg | ORAL_TABLET | Freq: Every day | ORAL | Status: DC
  Filled 2021-12-24: qty 1

## 2021-12-24 MED ORDER — LINAGLIPTIN 5 MG PO TABS
5.0000 mg | ORAL_TABLET | Freq: Every day | ORAL | Status: DC
  Filled 2021-12-24: qty 1

## 2021-12-24 MED ORDER — METHOCARBAMOL 500 MG PO TABS: 500.0000 mg | ORAL_TABLET | Freq: Three times a day (TID) | ORAL | Status: DC | PRN

## 2021-12-24 MED ORDER — ALBUTEROL SULFATE (2.5 MG/3ML) 0.083% IN NEBU: 2.5000 mg | INHALATION_SOLUTION | RESPIRATORY_TRACT | Status: DC | PRN

## 2021-12-24 MED ORDER — ONDANSETRON HCL 4 MG/2ML IJ SOLN: 4.0000 mg | Freq: Four times a day (QID) | INTRAMUSCULAR | Status: DC | PRN

## 2021-12-24 MED ORDER — ROPINIROLE HCL 1 MG PO TABS
5.0000 mg | ORAL_TABLET | Freq: Every day | ORAL | Status: DC
  Administered 2021-12-24 – 2021-12-26 (×3): 5 mg via ORAL
  Filled 2021-12-24 (×3): qty 5

## 2021-12-24 MED ORDER — TRAMADOL HCL 50 MG PO TABS
50.0000 mg | ORAL_TABLET | Freq: Four times a day (QID) | ORAL | Status: DC | PRN
  Administered 2021-12-25: 50 mg via ORAL
  Filled 2021-12-24: qty 1

## 2021-12-24 MED ORDER — IPRATROPIUM-ALBUTEROL 0.5-2.5 (3) MG/3ML IN SOLN
3.0000 mL | Freq: Four times a day (QID) | RESPIRATORY_TRACT | Status: DC
  Administered 2021-12-25 (×3): 3 mL via RESPIRATORY_TRACT
  Filled 2021-12-24 (×4): qty 3

## 2021-12-24 MED ORDER — ATENOLOL 25 MG PO TABS
12.5000 mg | ORAL_TABLET | ORAL | Status: DC
  Filled 2021-12-24: qty 1

## 2021-12-24 MED ORDER — ENOXAPARIN SODIUM 60 MG/0.6ML IJ SOSY
0.5000 mg/kg | PREFILLED_SYRINGE | INTRAMUSCULAR | Status: DC
  Administered 2021-12-24 – 2021-12-26 (×3): 47.5 mg via SUBCUTANEOUS
  Filled 2021-12-24 (×3): qty 0.6

## 2021-12-24 MED ORDER — INSULIN ASPART 100 UNIT/ML IJ SOLN
0.0000 [IU] | Freq: Every day | INTRAMUSCULAR | Status: DC
  Administered 2021-12-25: 3 [IU] via SUBCUTANEOUS
  Filled 2021-12-24: qty 1

## 2021-12-24 MED ORDER — INSULIN GLARGINE-YFGN 100 UNIT/ML ~~LOC~~ SOLN
20.0000 [IU] | Freq: Every day | SUBCUTANEOUS | Status: DC
  Administered 2021-12-25: 20 [IU] via SUBCUTANEOUS
  Filled 2021-12-24 (×2): qty 0.2

## 2021-12-24 MED ORDER — RANOLAZINE ER 500 MG PO TB12
1000.0000 mg | ORAL_TABLET | Freq: Two times a day (BID) | ORAL | Status: DC
  Administered 2021-12-24 – 2021-12-27 (×6): 1000 mg via ORAL
  Filled 2021-12-24 (×6): qty 2

## 2021-12-24 NOTE — H&P (Incomplete)
History and Physical    Patient: [name] YCX:[id number] DOB: 08-29-1945 DOA: 12/24/2021 DOS: the patient was seen and examined on 12/24/2021 PCP: Rusty Aus, MD  Patient coming from: Home  Chief Complaint:  Chief Complaint  Patient presents with   Shortness of Breath    HPI: [name] is a 76 y.o. female with medical history significant for Class III obesity, diastolic CHF, CKD 3 A, OSA on CPAP, insulin-dependent type 2 DM, HTN, s/p pacemaker 2021, CAD with nonobstructive disease on CT coronary 6/23 who was brought in by EMS for evaluation of acute worsening of chronic shortness of breath.  She denied associated chest pain, one-sided lower extremity swelling or pain, fever or chills.  She self-administered sublingual nitroglycerin x3 and 4 baby aspirin prior to arrival of EMS. patient has been having ongoing dyspnea for the past several months and has been evaluated by her cardiologist for possible worsening CHF, with up titration of diuretics and addition of Jardiance and spironolactone.  Additionally she has had episodes of tachycardia for which she was placed on Cardizem.  She was last seen by her cardiologist on 12/15/2021.  In spite of the above, patient continues to have persistent shortness of breath reason for presenting to the ED.  Of note, patient was evaluated by pulmonology and  had spirometry in March 2023 which was suggestive of mild restrictive lung disease.  CPAP was continued at that time.  Medication for rhinitis including Atrovent nasal spray and Claritin, Nettie pot and Flonase were also continued. ED course and data review: BP 153/77 with otherwise normal vitals.  Troponin and BNP within normal limits.  Hemoglobin at baseline at 11.6.  Creatinine 1.39 slightly above baseline of 1.18.  EKG, personally viewed and interpreted showed sinus rhythm at 96 with no acute abnormalities. Chest x-ray showed no active disease. Patient was treated with DuoNebs x2 and  Solu-Medrol and appeared to have symptomatic relief.  Hospitalist consulted for admission.   Review of Systems: As mentioned in the history of present illness. All other systems reviewed and are negative.  Past Medical History:  Diagnosis Date   Acquired elevated diaphragm    HIGHER ON RIGHT SIDE   Anemia    Anxiety    Aortic atherosclerosis (HCC)    Arthritis    CAD (coronary artery disease)    a. 07/2017 MV: Low risk; b. 09/2017 Cath: LM nl, LAD 50p/m, D1 80ost, LCX large/nl, RCA small/nl.   Chronic back pain    stenosis.degenerative disc,some scoliosis   Chronic heart failure with preserved ejection fraction (HFpEF) ([place])    a. 07/2017 Echo: EF 60-65%, no rwma, gr2 DD, nl RV fxn.   Chronic kidney disease    stage 3   Complication of anesthesia 2018   aspirated during wrist surgery during LMA removal   Constipation    takes Stool Softener daily   Coronary artery disease    Depression    takes Cymbalta daily   Diabetes mellitus    Type 2 diabetic. Average fasting blood sugar runs high [id number]   Diastolic CHF ([place]) 37/85/8850   Per patient, diagnosed in 2018   Diverticulosis    Dysplastic nevus 09/12/2017   L sup buttock - mild    Dysplastic nevus 09/18/2018   R sup med buttocks - mild    E coli infection    GERD (gastroesophageal reflux disease)    takes Nexium daily   Grade II diastolic dysfunction    Headache  Hemorrhoids    History of colon polyps    benign   History of gout    doesn't take any meds   History of hiatal hernia    small   History of vertigo    doesn't take any meds   Hyperlipidemia    takes Praluent daily   Hypertension    currently BP medications are on hold    Hypothyroidism    takes Synthroid daily   Insomnia    takes Restoril nightly   Muscle spasm    takes Robaxin as needed   NSVT (nonsustained ventricular tachycardia) (HCC)    OSA on CPAP    Paroxysmal atrial tachycardia    Peripheral vascular disease ([place])    AAA as stated per  pt / was just discovered and pt states has not been referred to vascular MD    Presence of permanent cardiac pacemaker    Restless leg    takes Requip daily   Rosacea    Symptomatic bradycardia    a. 02/2019 s/p Biotronik [place] 8 DR-T DC PPM   Syncope    a. Documented bradycardia and pauses-->02/2019 s/p Biotronik [place] 8 DR-T DC PPM.   Varicose veins    Past Surgical History:  Procedure Laterality Date   ABDOMINAL HYSTERECTOMY     with BSo   BACK SURGERY     CARDIAC CATHETERIZATION  2013   Normal   CARPAL TUNNEL RELEASE Bilateral    CATARACT EXTRACTION, BILATERAL     CHOLECYSTECTOMY     COLONOSCOPY     COLONOSCOPY WITH PROPOFOL N/A 11/25/2018   Procedure: COLONOSCOPY WITH PROPOFOL;  Surgeon: Toledo, Benay Pike, MD;  Location: [facility] ENDOSCOPY;  Service: Gastroenterology;  Laterality: N/A;   DIAGNOSTIC LAPAROSCOPY     multiple times   DILATION AND CURETTAGE OF UTERUS     ESOPHAGOGASTRODUODENOSCOPY (EGD) WITH PROPOFOL N/A 11/01/2014   Procedure: ESOPHAGOGASTRODUODENOSCOPY (EGD) WITH PROPOFOL;  Surgeon: Hulen Luster, MD;  Location[facility] END[facility]COPY;  Service: Gastroenterology;  Laterality: N/A;   ESOPHAGOGASTRODUODENOSCOPY (EGD) WITH PROPOFOL N/A 11/25/2018   Procedure: ESOPHAGOGASTRODUODENOSCOPY (EGD) WITH PROPOFOL;  Surgeon: Toledo, Benay Pike, MD;  Location: [facility] ENDOSCOPY;  Service: Gastroenterology;  Laterality: N/A;   HARDWARE REMOVAL Left 10/11/2016   Procedure: HARDWARE REMOVAL-LEFT RADIUS;  Surgeon: Lovell Sheehan, MD;  Location: [facility] ORS;  Service: Orthopedics;  Laterality: Left;  Left Radius Wrist    IMPLANTABLE CONTACT LENS IMPLANTATION     bilateral   KNEE ARTHROSCOPY Right 05/09/2020   Procedure: ARTHROSCOPY KNEE;  Surgeon: Dereck Leep, MD;  Location: [facility] ORS;  Service: Orthopedics;  Laterality: Right;   LAMINECTOMY  11/13/2015   LEFT HEART CATH AND CORONARY ANGIOGRAPHY Left 10/01/2017   Procedure: LEFT HEART CATH AND CORONARY ANGIOGRAPHY;  Surgeon: Nelva Bush, MD;   Location: [place] [facility];  Service: Cardiovascular;  Laterality: Left;   LOOP RECORDER INSERTION N/A 10/17/2017   Procedure: LOOP RECORDER INSERTION;  Surgeon: Deboraha Sprang, MD;  Location: [place] [facility];  Service: Cardiovascular;  Laterality: N/A;   LOOP RECORDER REMOVAL N/A 03/04/2019   Procedure: LOOP RECORDER REMOVAL;  Surgeon: Deboraha Sprang, MD;  Location: [place] [facility];  Service: Cardiovascular;  Laterality: N/A;   LUMBAR FUSION  11/2015   LUMBAR WOUND DEBRIDEMENT N/A 12/02/2015   Procedure: WOUND Exploration;  Surgeon: Consuella Lose, MD;  Location: [place];  Service: Neurosurgery;  Laterality: N/A;   OPEN REDUCTION INTERNAL FIXATION (ORIF) DISTAL RADIAL FRACTURE Left 08/29/2016   Procedure: OPEN  REDUCTION INTERNAL FIXATION (ORIF) DISTAL RADIAL FRACTURE;  Surgeon: Lovell Sheehan, MD;  Location: [facility] ORS;  Service: Orthopedics;  Laterality: Left;   PACEMAKER IMPLANT N/A 03/04/2019   Procedure: PACEMAKER IMPLANT;  Surgeon: Deboraha Sprang, MD;  Location: [place] [facility];  Service: Cardiovascular;  Laterality: N/A;   PICC LINE PLACE PERIPHERAL ([place] HX)     right upper arm    ROTATOR CUFF REPAIR Right    SAVORY DILATION N/A 11/01/2014   Procedure: SAVORY DILATION;  Surgeon: Hulen Luster, MD;  Location: [facility]COPY;  Service: Gastroenterology;  Laterality: N/A;   TONSILLECTOMY     TRIGGER FINGER RELEASE Bilateral    Social History:  reports that she has never smoked. She has never used smokeless tobacco. She reports that she does not drink alcohol and does not use drugs.  Allergies  Allergen Reactions   Ezetimibe Diarrhea and Nausea Only   Hydrocodone-Acetaminophen Other (See Comments)   Metoclopramide Diarrhea   Propoxyphene Other (See Comments)    Unsure of reaction type Unsure of reaction type Unsure of reaction type   Cefuroxime    Hydrocodone Other (See Comments)   Tramadol Hives   Ceftin [Cefuroxime Axetil] Diarrhea   Codeine Rash         Penicillins Rash and Other (See Comments)    Has patient had a PCN reaction causing immediate rash, facial/tongue/throat swelling, SOB or lightheadedness with hypotension: NO Has patient had a PCN reaction causing severe rash involving mucus membranes or skin necrosis: NO Has patient had a PCN retioion that required hospitalization NO Has patient had a PCN reaction occurring within the last 10 years: NO If all of the above answers are "NO", then may proceed with Cephalosporin use.   Statins Other (See Comments)    Leg pain Leg pain Other reaction(s): restless leg syndrome   Vicodin [Hydrocodone-Acetaminophen] Other (See Comments)    passes out    Family History  Problem Relation Age of Onset   Heart disease Mother    Breast cancer Mother 19   Heart disease Father    Heart attack Sister    Heart disease Sister    Heart disease Brother     Prior to Admission medications   Medication Sig Start Date End Date Taking? Authorizing Provider  Acetaminophen (TYLENOL ARTHRITIS PAIN PO) Take 1,300 mg by mouth every 8 (eight) hours as needed (pain).    [provider]  Alirocumab (PRALUENT) 150 MG/ML SOAJ Inject 150 mg into the skin every 14 (fourteen) days. 02/17/21   Minna Merritts, MD  aspirin 81 MG tablet Take 81 mg by mouth at bedtime.     [provider]  atenolol (TENORMIN) 25 MG tablet Take 0.5 tablets (12.5 mg total) by mouth every morning. 09/25/21   Minna Merritts, MD  Azelaic Acid (FINACEA) 15 % FOAM Apply 1 application. topically as directed. Qd to bid to aa face 06/29/21   Ralene Bathe, MD  azelastine (ASTELIN) 0.1 % nasal spray Place 2 sprays into both nostrils 2 (two) times daily as needed for rhinitis.    [provider]  cholecalciferol (VITAMIN D3) 25 MCG (1000 UNIT) tablet Take 1,000 Units by mouth daily.    [provider]  diclofenac Sodium (VOLTAREN) 1 % GEL Apply 2 g topically 2 (two) times daily.    [provider]   esomeprazole (NEXIUM) 40 MG capsule Take 40 mg by mouth 2 (two) times daily.    [provider]  gabapentin (NEURONTIN) 300 MG capsule Take 300 mg by mouth 2 (two) times daily.    [provider]  Insulin Aspart (NOVOLOG FLEXPEN [place] Pos) Inject 10 Units into the skin as needed.    [provider]  isosorbide mononitrate (IMDUR) 30 MG 24 hr tablet Take 0.5 tablets (15 mg total) by mouth daily. 09/25/21   Minna Merritts, MD  LANTUS SOLOSTAR 100 UNIT/ML Solostar Pen Inject 28 Units into the skin every morning. 06/27/20   [provider]  levothyroxine (SYNTHROID, LEVOTHROID) 75 MCG tablet Take 75 mcg by mouth daily before breakfast.  11/24/12   [provider]  linagliptin (TRADJENTA) 5 MG TABS tablet Take 5 mg by mouth daily. 03/10/20   [provider]  methocarbamol (ROBAXIN) 500 MG tablet Take 500 mg by mouth every 8 (eight) hours as needed for muscle spasms.    [provider]  mometasone (ELOCON) 0.1 % cream Apply 1 application topically daily as needed (Rash). Qd to bid up to 5 days a week aa right lower leg prn itching 06/22/20   Ralene Bathe, MD  nitroGLYCERIN (NITROSTAT) 0.4 MG SL tablet Place 1 tablet (0.4 mg total) under the tongue every 5 (five) minutes as needed for chest pain. 07/13/21 01/25/23  Minna Merritts, MD  ondansetron (ZOFRAN) 4 MG tablet Take 4 mg by mouth every 8 (eight) hours as needed for nausea or vomiting.  07/19/17   [provider]  ondansetron (ZOFRAN-ODT) 4 MG disintegrating tablet Take 1 tablet (4 mg total) by mouth every 6 (six) hours as needed for nausea or vomiting. 05/06/21   Delman Kitten, MD  potassium chloride (KLOR-CON) 10 MEQ tablet Take 2 tablets (20 mEq total) by mouth daily. 04/28/21   Deboraha Sprang, MD  ranolazine (RANEXA) 500 MG 12 hr tablet Take 2 tablets (1,000 mg total) by mouth 2 (two) times daily. 08/18/21   Dunn, Areta Haber, PA-C  ropinirole (REQUIP) 5 MG tablet Take 5 mg by mouth at  bedtime.    [provider]  temazepam (RESTORIL) 30 MG capsule Take 30 mg by mouth at bedtime.    [provider]  torsemide (DEMADEX) 20 MG tablet Take 2 tablets (40 mg) by mouth once daily 04/04/21   Deboraha Sprang, MD  traMADol (ULTRAM) 50 MG tablet Take 50 mg by mouth every 6 (six) hours as needed for moderate pain or severe pain.    [provider]  trolamine salicylate (ASPERCREME) 10 % cream Apply 1 application topically at bedtime.    [provider]    Physical Exam: Vitals:   12/24/21 1857 12/24/21 1900 12/24/21 1930 12/24/21 2051  BP:  (!) 162/58 (!) 122/49 (!) 158/64  Pulse:  83 73 98  Resp:  '17 15 18  '$ Temp:      TempSrc:      SpO2:  98% 98% 97%  Weight: 97.1 kg     Height: '5\' 1"'$  (1.549 m)      Physical Exam  Labs on Admission: I have personally reviewed following labs and imaging studies  CBC: Recent Labs  Lab 12/24/21 1855  WBC 7.3  HGB 11.6*  HCT 37.0  MCV 84.1  PLT 093   Basic Metabolic Panel: Recent Labs  Lab 12/24/21 1855  NA 138  K 3.8  CL 100  CO2 30  GLUCOSE 209*  BUN 24*  CREATININE 1.39*  CALCIUM 9.3   GFR: Estimated Creatinine Clearance: 36.7 mL/min (A) (by C-G formula based on SCr  of 1.39 mg/dL (H)). Liver Function Tests: Recent Labs  Lab 12/24/21 1855  AST 17  ALT 17  ALKPHOS 141*  BILITOT 0.6  PROT 7.4  ALBUMIN 4.0   No results for input(s): "LIPASE", "AMYLASE" in the last 168 hours. No results for input(s): "AMMONIA" in the last 168 hours. Coagulation Profile: No results for input(s): "INR", "PROTIME" in the last 168 hours. Cardiac Enzymes: No results for input(s): "CKTOTAL", "CKMB", "CKMBINDEX", "TROPONINI" in the last 168 hours. BNP (last 3 results) No results for input(s): "PROBNP" in the last 8760 hours. HbA1C: No results for input(s): "HGBA1C" in the last 72 hours. CBG: No results for input(s): "GLUCAP" in the last 168 hours. Lipid Profile: No results for input(s): "CHOL",  "HDL", "LDLCALC", "TRIG", "CHOLHDL", "LDLDIRECT" in the last 72 hours. Thyroid Function Tests: No results for input(s): "TSH", "T4TOTAL", "FREET4", "T3FREE", "THYROIDAB" in the last 72 hours. Anemia Panel: No results for input(s): "VITAMINB12", "FOLATE", "FERRITIN", "TIBC", "IRON", "RETICCTPCT" in the last 72 hours. Urine analysis:    Component Value Date/Time   COLORURINE YELLOW (A) 05/06/2021 0941   APPEARANCEUR HAZY (A) 05/06/2021 0941   LABSPEC 1.010 05/06/2021 0941   PHURINE 5.0 05/06/2021 0941   GLUCOSEU 50 (A) 05/06/2021 0941   HGBUR NEGATIVE 05/06/2021 0941   BILIRUBINUR NEGATIVE 05/06/2021 0941   BILIRUBINUR negative 05/06/2021 0846   KETONESUR NEGATIVE 05/06/2021 0941   KETONESUR negative 05/06/2021 0846   PROTEINUR NEGATIVE 05/06/2021 0941   PROTEINUR trace (A) 05/06/2021 0846   UROBILINOGEN 0.2 05/06/2021 0846   NITRITE NEGATIVE 05/06/2021 0941   NITRITE Negative 05/06/2021 0846   LEUKOCYTESUR NEGATIVE 05/06/2021 0941   LEUKOCYTESUR Trace (A) 05/06/2021 0846    Radiological Exams on Admission: DG Chest 2 View  Result Date: 12/24/2021 CLINICAL DATA:  Shortness of breath. Increased work of breathing and weakness. EXAM: CHEST - 2 VIEW COMPARISON:  Chest radiograph dated August 04, 2021 FINDINGS: The heart size and mediastinal contours are within normal limits. Mild aortic atherosclerotic calcifications. Left access pacemaker with leads in the right atrium and right ventricle. Partially imaged lumbar spine hardware. Elevation of the right hemidiaphragm, unchanged. IMPRESSION: 1. No active cardiopulmonary disease. 2. Elevation of the right hemidiaphragm, unchanged. Electronically Signed   By: Keane Police D.O.   On: 12/24/2021 20:21     Data Reviewed: Relevant notes from primary care and specialist visits, past discharge summaries as available in EHR, including Care Everywhere. Prior diagnostic testing as pertinent to current admission diagnoses Updated medications and  problem lists for reconciliation ED course, including vitals, labs, imaging, treatment and response to treatment Triage notes, nursing and pharmacy notes and ED provider's notes Notable results as noted in HPI   Assessment and Plan: * Acute on chronic dyspnea Likely multifactorial and related to OSA/OHS, possible restrictive lung disease on recent spirometry, CHF, ACS, physical deconditioning. Patient took nitroglycerin at home and had symptomatic relief with DuoNebs in the ED. PE also a consideration  For possible obstructive/and restrictive lung disease: -Continue DuoNebs and systemic steroids -Consider pulmonology consult For OSA/OHS -CPAP nightly For possible CHF exacerbation, though low suspicion for same - Continue home meds as will be outlined below For possible CAD, suspect unlikely due to recent nonobstructive disease on CT coronary - Continue home meds as will be outlined below, with nitroglycerin sublingual as needed chest pain - Cardiology consult For possible PE - We will get D-dimer and if elevated will follow-up with CTA chest  Supplemental oxygen as needed Antitussives as needed Incentive spirometry  Coronary  artery disease Patient with shortness of breath but without chest pain and first troponin negative and EKG nonacute Patient did get some symptomatic relief from nitroglycerin self-administered prior to arrival Will trend troponin Continue aspirin, Ranexa, atenolol, isosorbide, Praluent nitroglycerin as needed chest pain with morphine for breakthrough We will consult cardiology Per cardiology, Dr Rockey Situ, visit on "Coronary CTA 07/2021 showed nonobstructive disease with normal FFR. Comparison made to cardiac catheterization 2019, no dramatic changes. Still with moderate disease in the LAD"  Spirometry with Restrictive lung disease 04/2021 Patient had spirometry March 2023, results as follows: Can consider pulmonology consult Seen by Dr Raul Del  04/2021: SPIROMETRY: FVC was 1.71 Liters, 71% of predicted FEV1 was 1.32 Liters, 71% of predicted FEV1 ratio was 77.53%, 99% of predicted FEF 25-75% Liters per second was 1.12, 64% of predicted  FLOW VOLUME LOOP: Looks restricted  Impression Spirometry is c/w mild restriction   Chronic diastolic heart failure ([place]) Patient appears clinically euvolemic and BNP in the 20s and chest x-ray nonacute Continue torsemide, Jardiance and spironolactone as well as atenolol and Imdur Daily weights with intake and output monitoring Echo 09/02/21   1. Left ventricular ejection fraction, by estimation, is 60 to 65%. The  left ventricle has normal function. The left ventricle has no regional  wall motion abnormalities. Left ventricular diastolic parameters are  consistent with Grade II diastolic  dysfunction (pseudonormalization).   2. Right ventricular systolic function is normal. The right ventricular  size is normal. Tricuspid regurgitation signal is inadequate for assessing  PA pressure.   Physical deconditioning Consider physical therapy evaluation  Acute renal failure superimposed on stage 3a chronic kidney disease (HCC) Mild elevation in creatinine to 1.39 above baseline of 1.18 Suspect secondary to recent intensification of diuretic regimen which at her last cardiology visit was being down titrated due to worsening creatinine Given low suspicion for CHF exacerbation, will continue with lower dose of oral diuretic and not IV dose CHF among differentials Avoid nephrotoxins  Uncontrolled type 2 diabetes mellitus with hyperglycemia, with long-term current use of insulin (HCC) Blood sugar 209 Monitor for worsening given patient will be treated with prednisone for possible obstructive lung disease/hyperreactive airway disease Continue home basal insulin, Tradjenta with sliding scale coverage  Morbid obesity ([place]) Complicating factor to overall prognosis and care  Status post placement of  cardiac pacemaker No acute issues suspected Followed by Dr. Caryl Comes  Hypothyroidism Continue levothyroxine  HTN (hypertension) Continue atenolol.  OSA on CPAP CPAP nightly        DVT prophylaxis: Lovenox  Consults: [place] cardi[facility], Dr Rockey Situ  Advance Care Planning:   Code Status: Prior   Family Communication: none  Disposition Plan: Back to previous home environment  Severity of Illness: The appropriate patient status for this patient is OBSERVATION. Observation status is judged to be reasonable and necessary in order to provide the required intensity of service to ensure the patient's safety. The patient's presenting symptoms, physical exam findings, and initial radiographic and laboratory data in the context of their medical condition is felt to place them at decreased risk for further clinical deterioration. Furthermore, it is anticipated that the patient will be medically stable for discharge from the hospital within 2 midnights of admission.   Author: Athena Masse, MD 12/24/2021 9:58 PM  For on call review [email or web address].

## 2021-12-24 NOTE — Assessment & Plan Note (Addendum)
Patient had spirometry March 2023, results as follows: Can consider pulmonology consult Seen by Dr Raul Del 04/2021: SPIROMETRY: FVC was 1.71 Liters, 71% of predicted FEV1 was 1.32 Liters, 71% of predicted FEV1 ratio was 77.53%, 99% of predicted FEF 25-75% Liters per second was 1.12, 64% of predicted  FLOW VOLUME LOOP: Looks restricted  Impression Spirometry is c/w mild restriction

## 2021-12-24 NOTE — Progress Notes (Signed)
Anticoagulation monitoring(Lovenox):  76 yo female ordered Lovenox 40 mg Q24h    Filed Weights   12/24/21 1857  Weight: 97.1 kg (214 lb)   BMI 40   Lab Results  Component Value Date   CREATININE 1.39 (H) 12/24/2021   CREATININE 1.32 (H) 08/04/2021   CREATININE 1.18 (H) 05/06/2021   Estimated Creatinine Clearance: 36.7 mL/min (A) (by C-G formula based on SCr of 1.39 mg/dL (H)). Hemoglobin & Hematocrit     Component Value Date/Time   HGB 11.6 (L) 12/24/2021 1855   HGB 12.0 09/26/2017 1225   HCT 37.0 12/24/2021 1855   HCT 37.0 09/26/2017 1225     Per Protocol for Patient with estCrcl > 30 ml/min and BMI > 30, will transition to Lovenox 47.5 mg Q24h.

## 2021-12-24 NOTE — Assessment & Plan Note (Addendum)
Patient with shortness of breath but without chest pain and first troponin negative and EKG nonacute Patient did get some symptomatic relief from nitroglycerin self-administered prior to arrival Will trend troponin Continue aspirin, Ranexa, atenolol, isosorbide, Praluent nitroglycerin as needed chest pain with morphine for breakthrough We will consult cardiology Per cardiology, Dr Rockey Situ, visit on "Coronary CTA 07/2021 showed nonobstructive disease with normal FFR. Comparison made to cardiac catheterization 2019, no dramatic changes. Still with moderate disease in the LAD"

## 2021-12-24 NOTE — Assessment & Plan Note (Addendum)
Patient appears clinically euvolemic and BNP in the 20s and chest x-ray nonacute Continue torsemide, Trajenta and spironolactone as well as atenolol and Imdur Daily weights with intake and output monitoring Echo 09/02/21   1. Left ventricular ejection fraction, by estimation, is 60 to 65%. The  left ventricle has normal function. The left ventricle has no regional  wall motion abnormalities. Left ventricular diastolic parameters are  consistent with Grade II diastolic  dysfunction (pseudonormalization).   2. Right ventricular systolic function is normal. The right ventricular  size is normal. Tricuspid regurgitation signal is inadequate for assessing  PA pressure.

## 2021-12-24 NOTE — Assessment & Plan Note (Signed)
Continue atenolol. 

## 2021-12-24 NOTE — ED Triage Notes (Signed)
Patient arrived to ED via [facility]. C/O SOB and increased WOB and weakness. EMS states fluid in lungs, abdomen, and lower extremities. Administered 1 nitro and states pt self-administered 3 nitro and 4 baby aspirin prior to arrival.

## 2021-12-24 NOTE — Assessment & Plan Note (Signed)
Mild elevation in creatinine to 1.39 above baseline of 1.18 Suspect secondary to recent intensification of diuretic regimen which at her last cardiology visit was being down titrated due to worsening creatinine Given low suspicion for CHF exacerbation, will continue with lower dose of oral diuretic and not IV dose CHF among differentials Avoid nephrotoxins

## 2021-12-24 NOTE — Assessment & Plan Note (Signed)
CPAP nightly

## 2021-12-24 NOTE — Assessment & Plan Note (Addendum)
Likely multifactorial and related to OSA/OHS, possible restrictive lung disease on recent spirometry, CHF, ACS, physical deconditioning. Patient took nitroglycerin at home and had symptomatic relief with DuoNebs in the ED. PE also a consideration  For possible obstructive/and restrictive lung disease: -Continue DuoNebs and systemic steroids -Consider pulmonology consult For OSA/possible OHS -CPAP nightly For possible CHF exacerbation, though low suspicion for same - Continue home meds as will be outlined below For possible CAD, suspect unlikely due to recent nonobstructive disease on CT coronary - Continue home meds as will be outlined below, with nitroglycerin sublingual as needed chest pain - Cardiology consult For possible PE - Not tachycardic or tachypneic but in the absence of other definite etiology, -We will get D-dimer and if elevated will follow-up with CTA chest  Supplemental oxygen as needed Antitussives as needed Incentive spirometry

## 2021-12-24 NOTE — Assessment & Plan Note (Signed)
Continue levothyroxine 

## 2021-12-24 NOTE — Assessment & Plan Note (Deleted)
Continue basal insulin and Tradjenta Sliding scale insulin coverage

## 2021-12-24 NOTE — ED Notes (Signed)
Pt spouse at bedside

## 2021-12-24 NOTE — Assessment & Plan Note (Signed)
No acute issues suspected Followed by Dr. Caryl Comes

## 2021-12-24 NOTE — Assessment & Plan Note (Addendum)
Blood sugar 209 Monitor for worsening given patient will be treated with prednisone for possible obstructive lung disease/hyperreactive airway disease Continue home basal insulin, Tradjenta with sliding scale coverage

## 2021-12-24 NOTE — ED Notes (Signed)
Pt taken to Xray at this time.

## 2021-12-24 NOTE — Assessment & Plan Note (Signed)
Complicating factor to overall prognosis and care 

## 2021-12-24 NOTE — Assessment & Plan Note (Signed)
Consider physical therapy evaluation

## 2021-12-24 NOTE — ED Provider Notes (Signed)
[facility] Provider Note    Event Date/Time   First MD Initiated Contact with Patient 12/24/21 1846     (approximate)   History   Shortness of Breath   HPI  [name] is a 76 y.o. female with a history of diastolic CHF, CAD, CKD, CAD, diabetes who presents with complaints of shortness of breath.  Patient reports over the last month she has had shortness of breath seems to be worsening.  She has been working with her PCP, has tried torsemide twice a day which helped somewhat but she still feels shortness of breath.  She does see Dr. Rockey Situ of cardiology.  She denies chest pain.  No fevers or chills or cough.     Physical Exam   Triage Vital Signs: ED Triage Vitals  Enc Vitals Group     BP 12/24/21 1855 (!) 153/77     Pulse Rate 12/24/21 1855 82     Resp 12/24/21 1855 13     Temp 12/24/21 1855 98 F (36.7 C)     Temp Source 12/24/21 1855 Oral     SpO2 12/24/21 1850 100 %     Weight 12/24/21 1857 97.1 kg (214 lb)     Height 12/24/21 1857 1.549 m ('5\' 1"'$ )     Head Circumference --      Peak Flow --      Pain Score 12/24/21 1856 4     Pain Loc --      Pain Edu? --      Excl. in [place]? --     Most recent vital signs: Vitals:   12/24/21 1930 12/24/21 2051  BP: (!) 122/49 (!) 158/64  Pulse: 73 98  Resp: 15 18  Temp:    SpO2: 98% 97%     General: Awake, no distress.  CV:  Good peripheral perfusion.  Resp:  Normal effort.  Mild tachypnea Abd:  No distention.  Soft, nontender Other:  Mild lower extremity edema   ED Results / Procedures / Treatments   Labs (all labs ordered are listed, but only abnormal results are displayed) Labs Reviewed  CBC - Abnormal; Notable for the following components:      Result Value   Hemoglobin 11.6 (*)    RDW 15.6 (*)    All other components within normal limits  COMPREHENSIVE METABOLIC PANEL - Abnormal; Notable for the following components:   Glucose, Bld 209 (*)    BUN 24 (*)    Creatinine, Ser  1.39 (*)    Alkaline Phosphatase 141 (*)    GFR, Estimated 39 (*)    All other components within normal limits  BRAIN NATRIURETIC PEPTIDE  TROPONIN I (HIGH SENSITIVITY)     EKG  ED ECG REPORT I, Lavonia Drafts, the attending physician, personally viewed and interpreted this ECG.  Date: 12/24/2021  Rhythm: normal sinus rhythm QRS Axis: normal Intervals: normal ST/T Wave abnormalities: normal Narrative Interpretation: no evidence of acute ischemia    RADIOLOGY Chest x-ray viewed interpret by me, no pneumonia    PROCEDURES:  Critical Care performed:   Procedures   MEDICATIONS ORDERED IN ED: Medications  methylPREDNISolone sodium succinate (SOLU-MEDROL) 125 mg/2 mL injection 125 mg (has no administration in time range)  ipratropium-albuterol (DUONEB) 0.5-2.5 (3) MG/3ML nebulizer solution 3 mL (3 mLs Nebulization Given 12/24/21 1932)  ipratropium-albuterol (DUONEB) 0.5-2.5 (3) MG/3ML nebulizer solution 3 mL (3 mLs Nebulization Given 12/24/21 1932)     IMPRESSION / MDM / ASSESSMENT AND PLAN /  ED COURSE  I reviewed the triage vital signs and the nursing notes. Patient's presentation is most consistent with acute presentation with potential threat to life or bodily function.  Patient presents with shortness of breath.  She reports this has been worsening over the last week.  She has been working with her PCP, cardiologist and pulmonologist over some time because of mild shortness of breath however things have worsened recently.  She is short of breath when she talks to me.  Differential includes CHF, bronchospasm/COPD, pneumonia, pulmonary hypertension  Chest x-ray BNP are unremarkable, high sensitive troponin or EKG not consistent with ACS  Lab work today is overall reassuring.  Trial DuoNeb with some mild improvement, question possible bronchospasm.  Patient remains short of breath, have discussed with Dr. Damita Dunnings of the hospitalist service for admission         FINAL CLINICAL IMPRESSION(S) / ED DIAGNOSES   Final diagnoses:  Shortness of breath     Rx / DC Orders   ED Discharge Orders     None        Note:  This document was prepared using Dragon voice recognition software and may include unintentional dictation errors.   Lavonia Drafts, MD 12/24/21 2147

## 2021-12-25 ENCOUNTER — Other Ambulatory Visit: Payer: Self-pay

## 2021-12-25 ENCOUNTER — Encounter: Payer: Self-pay | Admitting: Internal Medicine

## 2021-12-25 DIAGNOSIS — R0600 Dyspnea, unspecified: Secondary | ICD-10-CM | POA: Diagnosis not present

## 2021-12-25 DIAGNOSIS — I200 Unstable angina: Secondary | ICD-10-CM | POA: Diagnosis not present

## 2021-12-25 DIAGNOSIS — N189 Chronic kidney disease, unspecified: Secondary | ICD-10-CM | POA: Diagnosis not present

## 2021-12-25 LAB — CBG MONITORING, ED
Glucose-Capillary: 229 mg/dL — ABNORMAL HIGH (ref 70–99)
Glucose-Capillary: 287 mg/dL — ABNORMAL HIGH (ref 70–99)
Glucose-Capillary: 232 mg/dL — ABNORMAL HIGH (ref 70–99)
Glucose-Capillary: 221 mg/dL — ABNORMAL HIGH (ref 70–99)

## 2021-12-25 LAB — HEMOGLOBIN A1C
Hgb A1c MFr Bld: 6.2 % — ABNORMAL HIGH (ref 4.8–5.6)
Mean Plasma Glucose: 131.24 mg/dL

## 2021-12-25 LAB — GLUCOSE, CAPILLARY: Glucose-Capillary: 262 mg/dL — ABNORMAL HIGH (ref 70–99)

## 2021-12-25 LAB — HIV ANTIBODY (ROUTINE TESTING W REFLEX): HIV Screen 4th Generation wRfx: NONREACTIVE

## 2021-12-25 MED ORDER — GUAIFENESIN 100 MG/5ML PO LIQD: 5.0000 mL | ORAL | Status: DC | PRN

## 2021-12-25 MED ORDER — BUDESONIDE 0.5 MG/2ML IN SUSP
0.5000 mg | Freq: Two times a day (BID) | RESPIRATORY_TRACT | Status: DC
  Administered 2021-12-25 – 2021-12-27 (×4): 0.5 mg via RESPIRATORY_TRACT
  Filled 2021-12-25 (×4): qty 2

## 2021-12-25 MED ORDER — EMPAGLIFLOZIN 10 MG PO TABS
10.0000 mg | ORAL_TABLET | Freq: Every day | ORAL | Status: DC
  Administered 2021-12-25: 10 mg via ORAL
  Filled 2021-12-25: qty 1

## 2021-12-25 MED ORDER — PANTOPRAZOLE SODIUM 40 MG PO TBEC
40.0000 mg | DELAYED_RELEASE_TABLET | Freq: Every day | ORAL | Status: DC
  Administered 2021-12-25 – 2021-12-27 (×3): 40 mg via ORAL
  Filled 2021-12-25 (×3): qty 1

## 2021-12-25 MED ORDER — SODIUM CHLORIDE 0.9% FLUSH
3.0000 mL | Freq: Two times a day (BID) | INTRAVENOUS | Status: DC
  Administered 2021-12-25 – 2021-12-26 (×3): 3 mL via INTRAVENOUS

## 2021-12-25 MED ORDER — VITAMIN D 25 MCG (1000 UNIT) PO TABS
1000.0000 [IU] | ORAL_TABLET | Freq: Every day | ORAL | Status: DC
  Administered 2021-12-25 – 2021-12-27 (×3): 1000 [IU] via ORAL
  Filled 2021-12-25 (×3): qty 1

## 2021-12-25 MED ORDER — IPRATROPIUM-ALBUTEROL 0.5-2.5 (3) MG/3ML IN SOLN: 3.0000 mL | RESPIRATORY_TRACT | Status: DC | PRN

## 2021-12-25 MED ORDER — DILTIAZEM HCL ER 60 MG PO CP12
120.0000 mg | ORAL_CAPSULE | Freq: Two times a day (BID) | ORAL | Status: DC
  Administered 2021-12-25 – 2021-12-26 (×4): 120 mg via ORAL
  Filled 2021-12-25 (×5): qty 2

## 2021-12-25 MED ORDER — METOPROLOL TARTRATE 5 MG/5ML IV SOLN: 5.0000 mg | INTRAVENOUS | Status: DC | PRN

## 2021-12-25 MED ORDER — SENNOSIDES-DOCUSATE SODIUM 8.6-50 MG PO TABS: 1.0000 | ORAL_TABLET | Freq: Every evening | ORAL | Status: DC | PRN

## 2021-12-25 MED ORDER — HYDRALAZINE HCL 20 MG/ML IJ SOLN: 10.0000 mg | INTRAMUSCULAR | Status: DC | PRN

## 2021-12-25 MED ORDER — TRAZODONE HCL 50 MG PO TABS: 50.0000 mg | ORAL_TABLET | Freq: Every evening | ORAL | Status: DC | PRN

## 2021-12-25 MED ORDER — GABAPENTIN 300 MG PO CAPS
300.0000 mg | ORAL_CAPSULE | Freq: Three times a day (TID) | ORAL | Status: DC
  Administered 2021-12-25 – 2021-12-27 (×7): 300 mg via ORAL
  Filled 2021-12-25 (×7): qty 1

## 2021-12-25 MED ORDER — AZELASTINE HCL 0.1 % NA SOLN: 2.0000 | Freq: Two times a day (BID) | NASAL | Status: DC | PRN

## 2021-12-25 NOTE — H&P (View-Only) (Signed)
Cardiology Consultation   Patient ID: [name] MRN: [id number]; DOB: [birth year]  Admit date: 12/24/2021 Date of Consult: 12/25/2021  PCP:  Ana Aus, MD   [place] Providers Cardiologist:  Ida Rogue, MD  Electrophysiologist:  Virl Axe, MD       Patient Profile:   [name] is a 76 y.o. 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 Reesa Chew.  History of Present Illness:   [name] is a 76 year old female with a history of coronary artery disease medically managed as outlined below, HFpEF, PAT/symptomatic bradycardia with syncope status post permanent pacemaker placement in 02/2019, type 2 diabetes, lymphedema, hyperlipidemia intolerant to statin therapy, CKD stage III, hypothyroidism, OSA on CPAP, chronic dyspnea, and depression.  Remote cardiac catheterization from 07/2011 showed a left dominant system with moderate mid LAD, proximal D1, proximal RCA disease estimated with 50% stenosis with normal LV systolic function.  She was previously evaluated for chest pain in 2019.  MPI was low risk and.  Coronary CTA was equivocal, though concerning for LAD disease.  Subsequent left heart cath showed 80% ostial diagonal stenosis with 50% proximal/mid LAD stenosis.  Medical management was recommended.  In 02/2019, she underwent permanent pacemaker implementation after experiencing syncope with loop monitoring showing prolonged bradycardia with pauses of up to 13 seconds.  When she was in the office in 08/04/2021 noted a 2-week history of constant substernal chest pain that was exacerbated  with ambulation associated with diaphoresis, dyspnea, nausea, and flushing.  EKG showed sinus rhythm with nonspecific anterior ST-T wave changes.  Given ongoing chest pain she was sent to the emergency department for further evaluation was found to have high-sensitivity troponin of 3 with a delta troponin of less than 2.  She declined admission.  Imdur was titrated to 30 mg daily.  Subsequent coronary CTA on 08/10/2021 showed a calcium score of 897, which was the 92nd percentile for age and sex matched control.  There was moderate calcified plaque in the proximal LAD and D1, estimated at 50 to 69% stenosis as well as mild proximal RCA and left circumflex calcified stenosis estimated at 25-49%.  CT FFR was negative.  She presented to [facility] department on 12/24/2021 with chest pain and progressively worsening shortness of breath.  She stated been progressively worsening over the last month. She also had chest discomfort and pressure with associated jaw pain. She states that often it is unilateral but then other times it is bilateral.  She has been taking sublingual nitroglycerin regularly for the discomfort and shortness of breath and is noted some relief with that unfortunately she states the decrease in her symptoms only last for approximately an hour prior to then restarting again.  She did take 2 doses of Nitrostat prior to arriving in the emergency department last evening.  She been working with her PCP and tried taking her torsemide twice daily which helped somewhat but she still feels to be short of breath.  She denies sick contacts, fevers, chills, or cough.She endorses chest discomfort, shortness of breath, peripheral edema, and fatigue.  Initial vital signs showed a blood pressure of 153/77, pulse of 82, respirations of  13, temperature of 98  Pertinent labs: Revealed a hemoglobin of 11.6, glucose of 209, BUN 24, serum creatinine 1.39, alkaline phosphatase 141, GFR is estimated at 39, D-dimer  0.44, BNP 21.3, high-sensitivity troponin of 4 and 3  Medications received in the emergency department: methylprednisone 125 mg IV push, DuoNeb x2   Past Medical History:  Diagnosis Date   Acquired elevated diaphragm    HIGHER ON RIGHT SIDE   Anemia    Anxiety    Aortic atherosclerosis (HCC)    Arthritis    CAD (coronary artery disease)    a. 07/2017 MV: Low risk; b. 09/2017 Cath: LM nl, LAD 50p/m, D1 80ost, LCX large/nl, RCA small/nl.   Chronic back pain    stenosis.degenerative disc,some scoliosis   Chronic heart failure with preserved ejection fraction (HFpEF) ([place])    a. 07/2017 Echo: EF 60-65%, no rwma, gr2 DD, nl RV fxn.   Chronic kidney disease    stage 3   Complication of anesthesia 2018   aspirated during wrist surgery during LMA removal   Constipation    takes Stool Softener daily   Coronary artery disease    Depression    takes Cymbalta daily   Diabetes mellitus    Type 2 diabetic. Average fasting blood sugar runs high 737-106   Diastolic CHF ([place]) 26/94/8546   Per patient, diagnosed in 2018   Diverticulosis    Dysplastic nevus 09/12/2017   L sup buttock - mild    Dysplastic nevus 09/18/2018   R sup med buttocks - mild    E coli infection    GERD (gastroesophageal reflux disease)    takes Nexium daily   Grade II diastolic dysfunction    Headache    Hemorrhoids    History of colon polyps    benign   History of gout    doesn't take any meds   History of hiatal hernia    small   History of vertigo    doesn't take any meds   Hyperlipidemia    takes Praluent daily   Hypertension    currently BP medications are on hold    Hypothyroidism    takes Synthroid daily   Insomnia    takes Restoril nightly   Muscle spasm    takes Robaxin as needed   NSVT (nonsustained ventricular tachycardia) (HCC)    OSA on CPAP    Paroxysmal atrial tachycardia    Peripheral vascular disease ([place])    AAA as stated per pt / was just discovered and pt states has not been  referred to vascular MD    Presence of permanent cardiac pacemaker    Restless leg    takes Requip daily   Rosacea    Symptomatic bradycardia    a. 02/2019 s/p Biotronik [place] 8 DR-T DC PPM   Syncope    a. Documented bradycardia and pauses-->02/2019 s/p Biotronik [place] 8 DR-T DC PPM.   Varicose veins     Past Surgical History:  Procedure Laterality Date   ABDOMINAL HYSTERECTOMY     with BSo   BACK SURGERY     CARDIAC CATHETERIZATION  2013   Normal   CARPAL TUNNEL RELEASE Bilateral    CATARACT EXTRACTION, BILATERAL     CHOLECYSTECTOMY     COLONOSCOPY     COLONOSCOPY WITH PROPOFOL N/A 11/25/2018   Procedure: COLONOSCOPY WITH PROPOFOL;  Surgeon: Toledo, Benay Pike, MD;  Location: [facility] ENDOSCOPY;  Service: Gastroenterology;  Laterality: N/A;   DIAGNOSTIC LAPAROSCOPY  multiple times   DILATION AND CURETTAGE OF UTERUS     ESOPHAGOGASTRODUODENOSCOPY (EGD) WITH PROPOFOL N/A 11/01/2014   Procedure: ESOPHAGOGASTRODUODENOSCOPY (EGD) WITH PROPOFOL;  Surgeon: Hulen Luster, MD;  Location[facility] ENDOSCOPY;  Service: Gastroenterology;  Laterality: N/A;   ESOPHAGOGASTRODUODENOSCOPY (EGD) WITH PROPOFOL N/A 11/25/2018   Procedure: ESOPHAGOGASTRODUODENOSCOPY (EGD) WITH PROPOFOL;  Surgeon: Toledo, Benay Pike, MD;  Location: [facility] ENDOSCOPY;  Service: Gastroenterology;  Laterality: N/A;   HARDWARE REMOVAL Left 10/11/2016   Procedure: HARDWARE REMOVAL-LEFT RADIUS;  Surgeon: Lovell Sheehan, MD;  Location: [facility] ORS;  Service: Orthopedics;  Laterality: Left;  Left Radius Wrist    IMPLANTABLE CONTACT LENS IMPLANTATION     bilateral   KNEE ARTHROSCOPY Right 05/09/2020   Procedure: ARTHROSCOPY KNEE;  Surgeon: Dereck Leep, MD;  Location: [facility] ORS;  Service: Orthopedics;  Laterality: Right;   LAMINECTOMY  11/13/2015   LEFT HEART CATH AND CORONARY ANGIOGRAPHY Left 10/01/2017   Procedure: LEFT HEART CATH AND CORONARY ANGIOGRAPHY;  Surgeon: Nelva Bush, MD;  Location: [place] CV LAB;  Service:  Cardiovascular;  Laterality: Left;   LOOP RECORDER INSERTION N/A 10/17/2017   Procedure: LOOP RECORDER INSERTION;  Surgeon: Deboraha Sprang, MD;  Location: [place] CV LAB;  Service: Cardiovascular;  Laterality: N/A;   LOOP RECORDER REMOVAL N/A 03/04/2019   Procedure: LOOP RECORDER REMOVAL;  Surgeon: Deboraha Sprang, MD;  Location: [place] CV LAB;  Service: Cardiovascular;  Laterality: N/A;   LUMBAR FUSION  11/2015   LUMBAR WOUND DEBRIDEMENT N/A 12/02/2015   Procedure: WOUND Exploration;  Surgeon: Consuella Lose, MD;  Location: [place];  Service: Neurosurgery;  Laterality: N/A;   OPEN REDUCTION INTERNAL FIXATION (ORIF) DISTAL RADIAL FRACTURE Left 08/29/2016   Procedure: OPEN REDUCTION INTERNAL FIXATION (ORIF) DISTAL RADIAL FRACTURE;  Surgeon: Lovell Sheehan, MD;  Location: [facility] ORS;  Service: Orthopedics;  Laterality: Left;   PACEMAKER IMPLANT N/A 03/04/2019   Procedure: PACEMAKER IMPLANT;  Surgeon: Deboraha Sprang, MD;  Location: [place] CV LAB;  Service: Cardiovascular;  Laterality: N/A;   PICC LINE PLACE PERIPHERAL ([place] HX)     right upper arm    ROTATOR CUFF REPAIR Right    SAVORY DILATION N/A 11/01/2014   Procedure: SAVORY DILATION;  Surgeon: Hulen Luster, MD;  Location[facility] ENDOSCOPY;  Service: Gastroenterology;  Laterality: N/A;   TONSILLECTOMY     TRIGGER FINGER RELEASE Bilateral      Home Medications:  Prior to Admission medications   Medication Sig Start Date End Date Taking? Authorizing Provider  cholecalciferol (VITAMIN D3) 25 MCG (1000 UNIT) tablet Take 1,000 Units by mouth daily.   Yes [provider]  diltiazem (CARDIZEM SR) 120 MG 12 hr capsule Take 120 mg by mouth 2 (two) times daily. 12/15/21 12/15/22 Yes [provider]  empagliflozin (JARDIANCE) 10 MG TABS tablet Take 10 mg by mouth daily. 12/15/21 12/15/22 Yes [provider]  esomeprazole (NEXIUM) 40 MG capsule Take 40 mg by mouth 2 (two) times daily.   Yes [provider]   gabapentin (NEURONTIN) 300 MG capsule Take 300 mg by mouth 2 (two) times daily.   Yes [provider]  LANTUS SOLOSTAR 100 UNIT/ML Solostar Pen Inject 28 Units into the skin every morning. 06/27/20  Yes [provider]  levothyroxine (SYNTHROID, LEVOTHROID) 75 MCG tablet Take 75 mcg by mouth daily before breakfast.  11/24/12  Yes [provider]  nitroGLYCERIN (NITROSTAT) 0.4 MG SL tablet Place 1 tablet (0.4 mg total) under the tongue every  5 (five) minutes as needed for chest pain. 07/13/21 01/25/23 Yes Gollan, Kathlene November, MD  potassium chloride (KLOR-CON) 10 MEQ tablet Take 2 tablets (20 mEq total) by mouth daily. 04/28/21  Yes Deboraha Sprang, MD  ranolazine (RANEXA) 500 MG 12 hr tablet Take 2 tablets (1,000 mg total) by mouth 2 (two) times daily. 08/18/21  Yes Dunn, Areta Haber, PA-C  torsemide (DEMADEX) 20 MG tablet Take 2 tablets (40 mg) by mouth once daily 04/04/21  Yes Deboraha Sprang, MD  Acetaminophen (TYLENOL ARTHRITIS PAIN PO) Take 1,300 mg by mouth every 8 (eight) hours as needed (pain).    [provider]  Alirocumab (PRALUENT) 150 MG/ML SOAJ Inject 150 mg into the skin every 14 (fourteen) days. 02/17/21   Minna Merritts, MD  aspirin 81 MG tablet Take 81 mg by mouth at bedtime.     [provider]  atenolol (TENORMIN) 25 MG tablet Take 0.5 tablets (12.5 mg total) by mouth every morning. Patient not taking: Reported on 12/25/2021 09/25/21   Minna Merritts, MD  Azelaic Acid (FINACEA) 15 % FOAM Apply 1 application. topically as directed. Qd to bid to aa face 06/29/21   Ralene Bathe, MD  azelastine (ASTELIN) 0.1 % nasal spray Place 2 sprays into both nostrils 2 (two) times daily as needed for rhinitis.    [provider]  diclofenac Sodium (VOLTAREN) 1 % GEL Apply 2 g topically 2 (two) times daily.    [provider]  Insulin Aspart (NOVOLOG FLEXPEN [place]) Inject 10 Units into the skin as needed.    [provider]  isosorbide  mononitrate (IMDUR) 30 MG 24 hr tablet Take 0.5 tablets (15 mg total) by mouth daily. Patient not taking: Reported on 12/25/2021 09/25/21   Minna Merritts, MD  linagliptin (TRADJENTA) 5 MG TABS tablet Take 5 mg by mouth daily. Patient not taking: Reported on 12/25/2021 03/10/20   [provider]  methocarbamol (ROBAXIN) 500 MG tablet Take 500 mg by mouth every 8 (eight) hours as needed for muscle spasms.    [provider]  mometasone (ELOCON) 0.1 % cream Apply 1 application topically daily as needed (Rash). Qd to bid up to 5 days a week aa right lower leg prn itching 06/22/20   Ralene Bathe, MD  ondansetron (ZOFRAN) 4 MG tablet Take 4 mg by mouth every 8 (eight) hours as needed for nausea or vomiting.  07/19/17   [provider]  ondansetron (ZOFRAN-ODT) 4 MG disintegrating tablet Take 1 tablet (4 mg total) by mouth every 6 (six) hours as needed for nausea or vomiting. 05/06/21   Delman Kitten, MD  ropinirole (REQUIP) 5 MG tablet Take 5 mg by mouth at bedtime.    [provider]  temazepam (RESTORIL) 30 MG capsule Take 30 mg by mouth at bedtime.    [provider]  traMADol (ULTRAM) 50 MG tablet Take 50 mg by mouth every 6 (six) hours as needed for moderate pain or severe pain.    [provider]  trolamine salicylate (ASPERCREME) 10 % cream Apply 1 application topically at bedtime.    [provider]    Inpatient Medications: Scheduled Meds:  aspirin  81 mg Oral QHS   budesonide (PULMICORT) nebulizer solution  0.5 mg Nebulization BID   cholecalciferol  1,000 Units Oral Daily   diltiazem  120 mg Oral BID   empagliflozin  10 mg Oral Daily   enoxaparin (LOVENOX) injection  0.5 mg/kg Subcutaneous Q24H  gabapentin  300 mg Oral TID   insulin aspart  0-20 Units Subcutaneous TID WC   insulin aspart  0-5 Units Subcutaneous QHS   insulin glargine-yfgn  20 Units Subcutaneous Daily   ipratropium-albuterol  3 mL Nebulization Q6H    levothyroxine  75 mcg Oral Q0600   methylPREDNISolone (SOLU-MEDROL) injection  40 mg Intravenous Q12H   Followed by   Derrill Memo ON 12/26/2021] predniSONE  40 mg Oral Q breakfast   pantoprazole  40 mg Oral Daily   potassium chloride SA  20 mEq Oral Daily   ranolazine  1,000 mg Oral BID   ropinirole  5 mg Oral QHS   temazepam  30 mg Oral QHS   torsemide  20 mg Oral Daily   Continuous Infusions:  PRN Meds: acetaminophen, azelastine, guaiFENesin, hydrALAZINE, ipratropium-albuterol, methocarbamol, metoprolol tartrate, nitroGLYCERIN, ondansetron (ZOFRAN) IV, senna-docusate, traMADol, traZODone  Allergies:    Allergies  Allergen Reactions   Ezetimibe Diarrhea and Nausea Only   Hydrocodone-Acetaminophen Other (See Comments)   Metoclopramide Diarrhea   Propoxyphene Other (See Comments)    Unsure of reaction type Unsure of reaction type Unsure of reaction type   Cefuroxime    Hydrocodone Other (See Comments)   Tramadol Hives   Ceftin [Cefuroxime Axetil] Diarrhea   Codeine Rash        Penicillins Rash and Other (See Comments)    Has patient had a PCN reaction causing immediate rash, facial/tongue/throat swelling, SOB or lightheadedness with hypotension: NO Has patient had a PCN reaction causing severe rash involving mucus membranes or skin necrosis: NO Has patient had a PCN retioion that required hospitalization NO Has patient had a PCN reaction occurring within the last 10 years: NO If all of the above answers are "NO", then may proceed with Cephalosporin use.   Statins Other (See Comments)    Leg pain Leg pain Other reaction(s): restless leg syndrome   Vicodin [Hydrocodone-Acetaminophen] Other (See Comments)    passes out    Social History:   Social History   Socioeconomic History   Marital status: Married    Spouse name: Not on file   Number of children: Not on file   Years of education: Not on file   Highest education level: Not on file  Occupational History    Occupation: retired  Tobacco Use   Smoking status: Never   Smokeless tobacco: Never  Vaping Use   Vaping Use: Never used  Substance and Sexual Activity   Alcohol use: No   Drug use: No   Sexual activity: Not on file  Other Topics Concern   Not on file  Social History Narrative   Not on file   Social Determinants of Health   Financial Resource Strain: Not on file  Food Insecurity: Not on file  Transportation Needs: Not on file  Physical Activity: Not on file  Stress: Not on file  Social Connections: Not on file  Intimate Partner Violence: Not on file    Family History:    Family History  Problem Relation Age of Onset   Heart disease Mother    Breast cancer Mother 62   Heart disease Father    Heart attack Sister    Heart disease Sister    Heart disease Brother      ROS:  Please see the history of present illness.  Review of Systems  Constitutional:  Positive for malaise/fatigue.  Respiratory:  Positive for shortness of breath.   Cardiovascular:  Positive for chest pain and leg swelling.  All other ROS reviewed and negative.     Physical Exam/Data:   Vitals:   12/25/21 0430 12/25/21 0945 12/25/21 1000 12/25/21 1200  BP: 111/65 (!) 168/72 (!) 144/74 (!) 153/90  Pulse: (!) 103 (!) 111 (!) 109 (!) 102  Resp: '20 15  17  '$ Temp:      TempSrc:      SpO2: 93% 95% 94% 95%  Weight:      Height:       No intake or output data in the 24 hours ending 12/25/21 1245    12/24/2021    6:57 PM 09/25/2021   11:21 AM 08/18/2021    7:51 AM  Last 3 Weights  Weight (lbs) 214 lb 213 lb 6 oz 207 lb 6 oz  Weight (kg) 97.07 kg 96.786 kg 94.065 kg     Body mass index is 40.43 kg/m.  General:  Well nourished, well developed, in no acute distress HEENT: normal Neck: no JVD Vascular: No carotid bruits; Distal pulses 2+ bilaterally Cardiac:  normal S1, S2; RRR; no murmur  Lungs:  clear to auscultation bilaterally, no wheezing, rhonchi or rales  Abd: soft, nontender, no  hepatomegaly  Ext: no edema Musculoskeletal:  No deformities, BUE and BLE strength normal and equal Skin: warm and dry  Neuro:  CNs 2-12 intact, no focal abnormalities noted Psych:  Normal affect   EKG:  The EKG was personally reviewed and demonstrates:  sinus tachycardia rate 104 Telemetry:  Telemetry was personally reviewed and demonstrates:  sinus rate of 94  Relevant CV Studies: Coronary CTA 08/10/2021 1. Coronary calcium score of 897. This was 92nd percentile for age and sex matched control.   2. Normal coronary origin with left dominance.   3. Calcified plaque causing moderate proximal LAD and D1 stenosis (50-69%).   4. Calcified plaque causing mild proximal RCA and mid LCx stenosis (25-49%).   5. CAD-RADS 3. Moderate stenosis. Consider symptom-guided anti-ischemic pharmacotherapy as well as risk factor modification per guideline directed care.   6. Additional analysis with CT FFR will be submitted and reported separately.  TTE 09/08/2020  1. Left ventricular ejection fraction, by estimation, is 60 to 65%. The  left ventricle has normal function. The left ventricle has no regional  wall motion abnormalities. Left ventricular diastolic parameters are  consistent with Grade II diastolic  dysfunction (pseudonormalization).   2. Right ventricular systolic function is normal. The right ventricular  size is normal. Tricuspid regurgitation signal is inadequate for assessing  PA pressure.  Laboratory Data:  High Sensitivity Troponin:   Recent Labs  Lab 12/24/21 1855 12/24/21 2249  TROPONINIHS 4 3     Chemistry Recent Labs  Lab 12/24/21 1855  NA 138  K 3.8  CL 100  CO2 30  GLUCOSE 209*  BUN 24*  CREATININE 1.39*  CALCIUM 9.3  GFRNONAA 39*  ANIONGAP 8    Recent Labs  Lab 12/24/21 1855  PROT 7.4  ALBUMIN 4.0  AST 17  ALT 17  ALKPHOS 141*  BILITOT 0.6   Lipids No results for input(s): "CHOL", "TRIG", "HDL", "LABVLDL", "LDLCALC", "CHOLHDL" in the last  168 hours.  Hematology Recent Labs  Lab 12/24/21 1855  WBC 7.3  RBC 4.40  HGB 11.6*  HCT 37.0  MCV 84.1  MCH 26.4  MCHC 31.4  RDW 15.6*  PLT 249   Thyroid No results for input(s): "TSH", "FREET4" in the last 168 hours.  BNP Recent Labs  Lab 12/24/21 1855  BNP 21.3  DDimer  Recent Labs  Lab 12/24/21 2249  DDIMER 0.44     Radiology/Studies:  DG Chest 2 View  Result Date: 12/24/2021 CLINICAL DATA:  Shortness of breath. Increased work of breathing and weakness. EXAM: CHEST - 2 VIEW COMPARISON:  Chest radiograph dated August 04, 2021 FINDINGS: The heart size and mediastinal contours are within normal limits. Mild aortic atherosclerotic calcifications. Left access pacemaker with leads in the right atrium and right ventricle. Partially imaged lumbar spine hardware. Elevation of the right hemidiaphragm, unchanged. IMPRESSION: 1. No active cardiopulmonary disease. 2. Elevation of the right hemidiaphragm, unchanged. Electronically Signed   By: Keane Police D.O.   On: 12/24/2021 20:21     Assessment and Plan:   Acute on chronic dyspnea -Likely multifactorial nature with combination of restrictive lung disease, diastolic congestive heart failure, deconditioning, OSA -BNP is normal, D-dimer is negative, and high-sensitivity troponins are negative x2 -Minimal improvement on steroids and nebulizers -Currently remains on room air -Becomes tachypneic while talking -Possible anginal equivalent -If her cath is unrevealing she may need further work-up for infiltrative causes of diastolic heart failure including amyloidosis  Coronary artery disease involving native coronary arteries with angina -Currently chest pain-free on exam -Has continued to have chest discomfort and has taken sublingual nitro as frequently as yesterday x2 doses prior to arrival to the emergency department -No ischemic changes noted on EKG -Last heart catheterization in 2019 revealed stenosis of the LAD and   D1 -Coronary CTA completed with negative FFR -With history of known coronary artery disease with progressive worsening shortness of breath she has been scheduled for right and left heart catheterization to reevaluate coronaries and pulmonary pressures -High-sensitivity troponins trended and negative x2, no heparin drip started at this time -Continued on aspirin, diltiazem and Ranexa, she has known statin intolerance and takes Praluent -Previously did not tolerate Imdur due to flushing and sweating, patient is amendable to trial low-dose Imdur 15 mg daily along with Ranexa 1000 mg twice daily if needed -Continued on as needed Nitrostat -Continue cardiac monitoring -EKG as needed for chest pain or changes  Chronic diastolic congestive heart failure -Last echocardiogram was completed 08/2020 with an LVEF of 60-65% -Echocardiogram ordered and pending -She is continued on torsemide to 20 mg daily, she has had up titration of torsemide due to the shortness of breath and swelling in the outpatient setting followed by her PCP -Chronic dyspnea and peripheral edema on exam -Daily weight, I's and O's, low-sodium diet  Restrictive lung disease -Followed by pulmonary as outpatient -Restrictive lung disease found on spirometry 04/2021 -Continue bronchodilators and prednisone -Managed by primary team  Chronic kidney disease stage IIIa -Serum creatinine 1.39 -Baseline serum creatinine 1.18-1.20 -Daily BMP -Monitor urine output -Monitor/trend/replete electrolytes as needed -Avoid nephrotoxic agents were able  Cardiac pacemaker in situ -Last interrogation completed 11/24/2021 -Device remote reviewed, remains normal, battery status is good, lead measurements are unchanged, histograms were appropriate -Follows with Dr. Caryl Comes  Hypertension -Blood pressure 153/90 -Continued on diltiazem 120 mg twice daily and torsemide 20 mg daily -Vital signs per unit protocol  Type 2 diabetes -Jardiance  discontinued for Procedure tomorrow as recommendations to discontinue 24 hours prior to procedure -Can be restarted after procedure is completed -Continued on insulin -Hemoglobin A1c 6.4 on 11/17/2021 -Management per primary team  Hypothyroidism -Continued on levothyroxine -Management per primary team  Obstructive sleep apnea -Sleeps with CPAP nightly  Shared Decision Making/Informed Consent The risks [stroke (1 in 1000), death (1 in 1000), kidney failure [usually temporary] (  1 in 500), bleeding (1 in 200), allergic reaction [possibly serious] (1 in 200)], benefits (diagnostic support and management of coronary artery disease) and alternatives of a cardiac catheterization were discussed in detail with Ms. Florene Glen and she is willing to proceed.    Risk Assessment/Risk Scores:       For questions or updates, please contact [place] Please consult [email or web address] for contact info under    Signed, Meira Wahba, NP  12/25/2021 12:45 PM

## 2021-12-25 NOTE — Plan of Care (Signed)
  Problem: Activity: Goal: Ability to tolerate increased activity will improve Outcome: Progressing   Problem: Cardiac: Goal: Ability to achieve and maintain adequate cardiovascular perfusion will improve Outcome: Progressing   

## 2021-12-25 NOTE — ED Notes (Addendum)
Pt moved from room 10 to 38 via wheelchair by this RN. Pt placed on cardiac monitor, BP and pulse ox. Bed in lowest position and locked. Call bell in reach. Receiving RN Leala made aware.

## 2021-12-25 NOTE — Evaluation (Signed)
Physical Therapy Evaluation Patient Details Name: [name] MRN: [id number] DOB: [birth year] Today's Date: 12/25/2021  History of Present Illness  Patient is a 76 year old female with brought in by EMS for evaluation of acute worsening of chronic shortness of breath and associated chest tightness and jaw pain. History of  diastolic CHF, CAD, CKD, CAD, diabetes, back surgery  Clinical Impression  Patient was agreeable to PT evaluation. Spouse at the bedside. The patient reports she is usually ambulatory with rolling walker or cane and drives at baseline. She has some residual sensation/strength deficits in RLE from prior back surgery that is unchanged.  No physical assistance required for getting up from bed. She performed 2 standing bouts without lifting assistance. She ambulated with hand held assistance in the room with dyspnea with exertion noted during walking. Sp02 remained at 91% or above with ambulation with the highest heart rate at 126bpm while walking. The patient reports feeling better but not back to her baseline. Recommend to continue PT while in the hospital to maximize independence and facilitate return to prior level of function. Consider HHPT at discharge.      Recommendations for follow up therapy are one component of a multi-disciplinary discharge planning process, led by the attending physician.  Recommendations may be updated based on patient status, additional functional criteria and insurance authorization.  Follow Up Recommendations Home health PT      Assistance Recommended at Discharge PRN  Patient can return home with the following  Assistance with cooking/housework;Assist for transportation;Help with stairs or ramp for entrance    Equipment Recommendations None recommended by PT  Recommendations for Other Services       Functional Status Assessment Patient has had a recent decline in their functional status and demonstrates the ability to make significant  improvements in function in a reasonable and predictable amount of time.     Precautions / Restrictions Precautions Precautions: Fall Restrictions Weight Bearing Restrictions: No      Mobility  Bed Mobility Overal bed mobility: Needs Assistance Bed Mobility: Supine to Sit     Supine to sit: Supervision, HOB elevated     General bed mobility comments: no physical assistance required    Transfers Overall transfer level: Needs assistance Equipment used: 1 person hand held assist Transfers: Sit to/from Stand, Bed to chair/wheelchair/BSC Sit to Stand: Min guard   Step pivot transfers: Min guard       General transfer comment: Min guard for safety    Ambulation/Gait Ambulation/Gait assistance: Min guard Gait Distance (Feet): 35 Feet Assistive device: 1 person hand held assist Gait Pattern/deviations: Step-through pattern, Narrow base of support Gait velocity: decreased     General Gait Details: patient ambulated a short distance in the room with hand held assistance. dyspnea with exertion with Sp02 91% or higher on room air. heart rate went from 116bpm at rest to 126bpm while ambulating. occasional cues for safety provided with functional mobility. no dizziness reported with walking  Stairs            Wheelchair Mobility    Modified Rankin (Stroke Patients Only)       Balance Overall balance assessment: Needs assistance Sitting-balance support: Feet supported Sitting balance-Leahy Scale: Fair     Standing balance support: Single extremity supported Standing balance-Leahy Scale: Fair                               Pertinent Vitals/Pain Pain Assessment  Pain Assessment: Faces Faces Pain Scale: Hurts a little bit Pain Location: sacrum/coccyx, jaw Pain Descriptors / Indicators: Discomfort Pain Intervention(s): Limited activity within patient's tolerance, Monitored during session, Repositioned    Home Living Family/patient expects to be  discharged to:: Private residence Living Arrangements: Spouse/significant other Available Help at Discharge: Family Type of Home: House Home Access: Stairs to enter Entrance Stairs-Rails:  (grab bar) Entrance Stairs-Number of Steps: 1   Home Layout: One level Home Equipment: Conservation officer, nature (2 wheels);Cane - single point;Shower seat;Adaptive equipment      Prior Function Prior Level of Function : Driving;Independent/Modified Independent             Mobility Comments: using cane or rolling walker intermittently as needed ADLs Comments: Mod I, using shower chair PRN for bathing     Hand Dominance        Extremity/Trunk Assessment   Upper Extremity Assessment Upper Extremity Assessment: Overall WFL for tasks assessed    Lower Extremity Assessment Lower Extremity Assessment:  (mild residual deficits following back surgery in RLE that are unchanged from baseline)       Communication   Communication: No difficulties  Cognition Arousal/Alertness: Awake/alert Behavior During Therapy: WFL for tasks assessed/performed Overall Cognitive Status: Within Functional Limits for tasks assessed                                          General Comments General comments (skin integrity, edema, etc.): safety cues provided with activity.    Exercises     Assessment/Plan    PT Assessment Patient needs continued PT services  PT Problem List Decreased strength;Decreased range of motion;Decreased activity tolerance;Decreased balance;Decreased mobility;Cardiopulmonary status limiting activity       PT Treatment Interventions DME instruction;Gait training;Stair training;Functional mobility training;Therapeutic activities;Therapeutic exercise;Balance training;Neuromuscular re-education;Cognitive remediation;Patient/family education    PT Goals (Current goals can be found in the Care Plan section)  Acute Rehab PT Goals Patient Stated Goal: to return home PT Goal  Formulation: With patient Time For Goal Achievement: 01/08/22 Potential to Achieve Goals: Good    Frequency Min 2X/week     Co-evaluation               AM-PAC PT "6 Clicks" Mobility  Outcome Measure Help needed turning from your back to your side while in a flat bed without using bedrails?: None Help needed moving from lying on your back to sitting on the side of a flat bed without using bedrails?: A Little Help needed moving to and from a bed to a chair (including a wheelchair)?: A Little Help needed standing up from a chair using your arms (e.g., wheelchair or bedside chair)?: A Little Help needed to walk in hospital room?: A Little Help needed climbing 3-5 steps with a railing? : A Little 6 Click Score: 19    End of Session Equipment Utilized During Treatment: Gait belt Activity Tolerance: Patient tolerated treatment well Patient left: in chair;with call bell/phone within reach;with family/visitor present Nurse Communication: Mobility status PT Visit Diagnosis: Muscle weakness (generalized) (M62.81);Unsteadiness on feet (R26.81)    Time: 5009-3818 PT Time Calculation (min) (ACUTE ONLY): 28 min   Charges:   PT Evaluation $PT Eval Low Complexity: 1 Low PT Treatments $Therapeutic Activity: 8-22 mins        Minna Merritts, PT, MPT  Percell Locus 12/25/2021, 10:12 AM

## 2021-12-25 NOTE — ED Notes (Signed)
Per Morton Amy NP, Pt advised to let morning rounding MD know of medication request for diltiazem vs atenolol.

## 2021-12-25 NOTE — ED Notes (Signed)
Respiratory contacted to request pt cpap set up.

## 2021-12-25 NOTE — ED Notes (Signed)
This RN assisted pt with Cpap fitting adjustment.  Pt stated improvement.

## 2021-12-25 NOTE — ED Notes (Signed)
Received report from Bryant, Therapist, sports.  Pt transferred from Main ED placed on monitor.

## 2021-12-25 NOTE — Consult Note (Signed)
Cardiology Consultation   Patient ID: [name] MRN: [id number]; DOB: 06-Aug-1945  Admit date: 12/24/2021 Date of Consult: 12/25/2021  PCP:  Ana Aus, MD   [place] [facility] Cardiologist:  Ida Rogue, MD  Electrophysiologist:  Virl Axe, MD       Patient Profile:   [name] is a 76 y.o. 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 Reesa Chew.  History of Present Illness:   [name] is a 76 year old female with a history of coronary artery disease medically managed as outlined below, HFpEF, PAT/symptomatic bradycardia with syncope status post permanent pacemaker placement in 02/2019, type 2 diabetes, lymphedema, hyperlipidemia intolerant to statin therapy, CKD stage III, hypothyroidism, OSA on CPAP, chronic dyspnea, and depression.  Remote cardiac catheterization from 07/2011 showed a left dominant system with moderate mid LAD, proximal D1, proximal RCA disease estimated with 50% stenosis with normal LV systolic function.  She was previously evaluated for chest pain in 2019.  MPI was low risk and.  Coronary CTA was equivocal, though concerning for LAD disease.  Subsequent left heart cath showed 80% ostial diagonal stenosis with 50% proximal/mid LAD stenosis.  Medical management was recommended.  In 02/2019, she underwent permanent pacemaker implementation after experiencing syncope with loop monitoring showing prolonged bradycardia with pauses of up to 13 seconds.  When she was in the office in 08/04/2021 noted a 2-week history of constant substernal chest pain that was exacerbated  with ambulation associated with diaphoresis, dyspnea, nausea, and flushing.  EKG showed sinus rhythm with nonspecific anterior ST-T wave changes.  Given ongoing chest pain she was sent to the emergency department for further evaluation was found to have high-sensitivity troponin of 3 with a delta troponin of less than 2.  She declined admission.  Imdur was titrated to 30 mg daily.  Subsequent coronary CTA on 08/10/2021 showed a calcium score of 897, which was the 92nd percentile for age and sex matched control.  There was moderate calcified plaque in the proximal LAD and D1, estimated at 50 to 69% stenosis as well as mild proximal RCA and left circumflex calcified stenosis estimated at 25-49%.  CT FFR was negative.  She presented to the [facility] emergency department on 12/24/2021 with chest pain and progressively worsening shortness of breath.  She stated been progressively worsening over the last month. She also had chest discomfort and pressure with associated jaw pain. She states that often it is unilateral but then other times it is bilateral.  She has been taking sublingual nitroglycerin regularly for the discomfort and shortness of breath and is noted some relief with that unfortunately she states the decrease in her symptoms only last for approximately an hour prior to then restarting again.  She did take 2 doses of Nitrostat prior to arriving in the emergency department last evening.  She been working with her PCP and tried taking her torsemide twice daily which helped somewhat but she still feels to be short of breath.  She denies sick contacts, fevers, chills, or cough.She endorses chest discomfort, shortness of breath, peripheral edema, and fatigue.  Initial vital signs showed a blood pressure of 153/77, pulse of 82, respirations of  13, temperature of 98  Pertinent labs: Revealed a hemoglobin of 11.6, glucose of 209, BUN 24, serum creatinine 1.39, alkaline phosphatase 141, GFR is estimated at 39, D-dimer  0.44, BNP 21.3, high-sensitivity troponin of 4 and 3  Medications received in the emergency department: methylprednisone 125 mg IV push, DuoNeb x2   Past Medical History:  Diagnosis Date   Acquired elevated diaphragm    HIGHER ON RIGHT SIDE   Anemia    Anxiety    Aortic atherosclerosis (HCC)    Arthritis    CAD (coronary artery disease)    a. 07/2017 MV: Low risk; b. 09/2017 Cath: LM nl, LAD 50p/m, D1 80ost, LCX large/nl, RCA small/nl.   Chronic back pain    stenosis.degenerative disc,some scoliosis   Chronic heart failure with preserved ejection fraction (HFpEF) ([place])    a. 07/2017 Echo: EF 60-65%, no rwma, gr2 DD, nl RV fxn.   Chronic kidney disease    stage 3   Complication of anesthesia 2018   aspirated during wrist surgery during LMA removal   Constipation    takes Stool Softener daily   Coronary artery disease    Depression    takes Cymbalta daily   Diabetes mellitus    Type 2 diabetic. Average fasting blood sugar runs high [phone]   Diastolic CHF ([place]) 95/62/1308   Per patient, diagnosed in 2018   Diverticulosis    Dysplastic nevus 09/12/2017   L sup buttock - mild    Dysplastic nevus 09/18/2018   R sup med buttocks - mild    E coli infection    GERD (gastroesophageal reflux disease)    takes Nexium daily   Grade II diastolic dysfunction    Headache    Hemorrhoids    History of colon polyps    benign   History of gout    doesn't take any meds   History of hiatal hernia    small   History of vertigo    doesn't take any meds   Hyperlipidemia    takes Praluent daily   Hypertension    currently BP medications are on hold    Hypothyroidism    takes Synthroid daily   Insomnia    takes Restoril nightly   Muscle spasm    takes Robaxin as needed   NSVT (nonsustained ventricular tachycardia) (HCC)    OSA on CPAP    Paroxysmal atrial tachycardia    Peripheral vascular disease ([place])    AAA as stated per pt / was just discovered and pt states has not been  referred to vascular MD    Presence of permanent cardiac pacemaker    Restless leg    takes Requip daily   Rosacea    Symptomatic bradycardia    a. 02/2019 s/p Biotronik [place] 8 DR-T DC PPM   Syncope    a. Documented bradycardia and pauses-->02/2019 s/p Biotronik [place] 8 DR-T DC PPM.   Varicose veins     Past Surgical History:  Procedure Laterality Date   ABDOMINAL HYSTERECTOMY     with BSo   BACK SURGERY     CARDIAC CATHETERIZATION  2013   Normal   CARPAL TUNNEL RELEASE Bilateral    CATARACT EXTRACTION, BILATERAL     CHOLECYSTECTOMY     COLONOSCOPY     COLONOSCOPY WITH PROPOFOL N/A 11/25/2018   Procedure: COLONOSCOPY WITH PROPOFOL;  Surgeon: Toledo, Benay Pike, MD;  Location: [facility] ENDOSCOPY;  Service: Gastroenterology;  Laterality: N/A;   DIAGNOSTIC LAPAROSCOPY  multiple times   DILATION AND CURETTAGE OF UTERUS     ESOPHAGOGASTRODUODENOSCOPY (EGD) WITH PROPOFOL N/A 11/01/2014   Procedure: ESOPHAGOGASTRODUODENOSCOPY (EGD) WITH PROPOFOL;  Surgeon: Hulen Luster, MD;  Location: [facility] ENDOSCOPY;  Service: Gastroenterology;  Laterality: N/A;   ESOPHAGOGASTRODUODENOSCOPY (EGD) WITH PROPOFOL N/A 11/25/2018   Procedure: ESOPHAGOGASTRODUODENOSCOPY (EGD) WITH PROPOFOL;  Surgeon: Toledo, Benay Pike, MD;  Location: [facility] ENDOSCOPY;  Service: Gastroenterology;  Laterality: N/A;   HARDWARE REMOVAL Left 10/11/2016   Procedure: HARDWARE REMOVAL-LEFT RADIUS;  Surgeon: Lovell Sheehan, MD;  Location: [facility] ORS;  Service: Orthopedics;  Laterality: Left;  Left Radius Wrist    IMPLANTABLE CONTACT LENS IMPLANTATION     bilateral   KNEE ARTHROSCOPY Right 05/09/2020   Procedure: ARTHROSCOPY KNEE;  Surgeon: Dereck Leep, MD;  Location: [facility] ORS;  Service: Orthopedics;  Laterality: Right;   LAMINECTOMY  11/13/2015   LEFT HEART CATH AND CORONARY ANGIOGRAPHY Left 10/01/2017   Procedure: LEFT HEART CATH AND CORONARY ANGIOGRAPHY;  Surgeon: Nelva Bush, MD;  Location: [place] CV LAB;  Service:  Cardiovascular;  Laterality: Left;   LOOP RECORDER INSERTION N/A 10/17/2017   Procedure: LOOP RECORDER INSERTION;  Surgeon: Deboraha Sprang, MD;  Location: [place] CV LAB;  Service: Cardiovascular;  Laterality: N/A;   LOOP RECORDER REMOVAL N/A 03/04/2019   Procedure: LOOP RECORDER REMOVAL;  Surgeon: Deboraha Sprang, MD;  Location: [place] CV LAB;  Service: Cardiovascular;  Laterality: N/A;   LUMBAR FUSION  11/2015   LUMBAR WOUND DEBRIDEMENT N/A 12/02/2015   Procedure: WOUND Exploration;  Surgeon: Consuella Lose, MD;  Location: [place];  Service: Neurosurgery;  Laterality: N/A;   OPEN REDUCTION INTERNAL FIXATION (ORIF) DISTAL RADIAL FRACTURE Left 08/29/2016   Procedure: OPEN REDUCTION INTERNAL FIXATION (ORIF) DISTAL RADIAL FRACTURE;  Surgeon: Lovell Sheehan, MD;  Location: [facility] ORS;  Service: Orthopedics;  Laterality: Left;   PACEMAKER IMPLANT N/A 03/04/2019   Procedure: PACEMAKER IMPLANT;  Surgeon: Deboraha Sprang, MD;  Location: [place] CV LAB;  Service: Cardiovascular;  Laterality: N/A;   PICC LINE PLACE PERIPHERAL ([place] HX)     right upper arm    ROTATOR CUFF REPAIR Right    SAVORY DILATION N/A 11/01/2014   Procedure: SAVORY DILATION;  Surgeon: Hulen Luster, MD;  Location: [facility] ENDOSCOPY;  Service: Gastroenterology;  Laterality: N/A;   TONSILLECTOMY     TRIGGER FINGER RELEASE Bilateral      Home Medications:  Prior to Admission medications   Medication Sig Start Date End Date Taking? Authorizing Provider  cholecalciferol (VITAMIN D3) 25 MCG (1000 UNIT) tablet Take 1,000 Units by mouth daily.   Yes [provider]  diltiazem (CARDIZEM SR) 120 MG 12 hr capsule Take 120 mg by mouth 2 (two) times daily. 12/15/21 12/15/22 Yes [provider]  empagliflozin (JARDIANCE) 10 MG TABS tablet Take 10 mg by mouth daily. 12/15/21 12/15/22 Yes [provider]  esomeprazole (NEXIUM) 40 MG capsule Take 40 mg by mouth 2 (two) times daily.   Yes [provider]   gabapentin (NEURONTIN) 300 MG capsule Take 300 mg by mouth 2 (two) times daily.   Yes [provider]  LANTUS SOLOSTAR 100 UNIT/ML Solostar Pen Inject 28 Units into the skin every morning. 06/27/20  Yes [provider]  levothyroxine (SYNTHROID, LEVOTHROID) 75 MCG tablet Take 75 mcg by mouth daily before breakfast.  11/24/12  Yes [provider]  nitroGLYCERIN (NITROSTAT) 0.4 MG SL tablet Place 1 tablet (0.4 mg total) under the tongue every  5 (five) minutes as needed for chest pain. 07/13/21 01/25/23 Yes Gollan, Kathlene November, MD  potassium chloride (KLOR-CON) 10 MEQ tablet Take 2 tablets (20 mEq total) by mouth daily. 04/28/21  Yes Deboraha Sprang, MD  ranolazine (RANEXA) 500 MG 12 hr tablet Take 2 tablets (1,000 mg total) by mouth 2 (two) times daily. 08/18/21  Yes Dunn, Areta Haber, PA-C  torsemide (DEMADEX) 20 MG tablet Take 2 tablets (40 mg) by mouth once daily 04/04/21  Yes Deboraha Sprang, MD  Acetaminophen (TYLENOL ARTHRITIS PAIN PO) Take 1,300 mg by mouth every 8 (eight) hours as needed (pain).    [provider]  Alirocumab (PRALUENT) 150 MG/ML SOAJ Inject 150 mg into the skin every 14 (fourteen) days. 02/17/21   Minna Merritts, MD  aspirin 81 MG tablet Take 81 mg by mouth at bedtime.     [provider]  atenolol (TENORMIN) 25 MG tablet Take 0.5 tablets (12.5 mg total) by mouth every morning. Patient not taking: Reported on 12/25/2021 09/25/21   Minna Merritts, MD  Azelaic Acid (FINACEA) 15 % FOAM Apply 1 application. topically as directed. Qd to bid to aa face 06/29/21   Ralene Bathe, MD  azelastine (ASTELIN) 0.1 % nasal spray Place 2 sprays into both nostrils 2 (two) times daily as needed for rhinitis.    [provider]  diclofenac Sodium (VOLTAREN) 1 % GEL Apply 2 g topically 2 (two) times daily.    [provider]  Insulin Aspart (NOVOLOG FLEXPEN [place]) Inject 10 Units into the skin as needed.    [provider]  isosorbide  mononitrate (IMDUR) 30 MG 24 hr tablet Take 0.5 tablets (15 mg total) by mouth daily. Patient not taking: Reported on 12/25/2021 09/25/21   Minna Merritts, MD  linagliptin (TRADJENTA) 5 MG TABS tablet Take 5 mg by mouth daily. Patient not taking: Reported on 12/25/2021 03/10/20   [provider]  methocarbamol (ROBAXIN) 500 MG tablet Take 500 mg by mouth every 8 (eight) hours as needed for muscle spasms.    [provider]  mometasone (ELOCON) 0.1 % cream Apply 1 application topically daily as needed (Rash). Qd to bid up to 5 days a week aa right lower leg prn itching 06/22/20   Ralene Bathe, MD  ondansetron (ZOFRAN) 4 MG tablet Take 4 mg by mouth every 8 (eight) hours as needed for nausea or vomiting.  07/19/17   [provider]  ondansetron (ZOFRAN-ODT) 4 MG disintegrating tablet Take 1 tablet (4 mg total) by mouth every 6 (six) hours as needed for nausea or vomiting. 05/06/21   Delman Kitten, MD  ropinirole (REQUIP) 5 MG tablet Take 5 mg by mouth at bedtime.    [provider]  temazepam (RESTORIL) 30 MG capsule Take 30 mg by mouth at bedtime.    [provider]  traMADol (ULTRAM) 50 MG tablet Take 50 mg by mouth every 6 (six) hours as needed for moderate pain or severe pain.    [provider]  trolamine salicylate (ASPERCREME) 10 % cream Apply 1 application topically at bedtime.    [provider]    Inpatient Medications: Scheduled Meds:  aspirin  81 mg Oral QHS   budesonide (PULMICORT) nebulizer solution  0.5 mg Nebulization BID   cholecalciferol  1,000 Units Oral Daily   diltiazem  120 mg Oral BID   empagliflozin  10 mg Oral Daily   enoxaparin (LOVENOX) injection  0.5 mg/kg Subcutaneous Q24H  gabapentin  300 mg Oral TID   insulin aspart  0-20 Units Subcutaneous TID WC   insulin aspart  0-5 Units Subcutaneous QHS   insulin glargine-yfgn  20 Units Subcutaneous Daily   ipratropium-albuterol  3 mL Nebulization Q6H    levothyroxine  75 mcg Oral Q0600   methylPREDNISolone (SOLU-MEDROL) injection  40 mg Intravenous Q12H   Followed by   Derrill Memo ON 12/26/2021] predniSONE  40 mg Oral Q breakfast   pantoprazole  40 mg Oral Daily   potassium chloride SA  20 mEq Oral Daily   ranolazine  1,000 mg Oral BID   ropinirole  5 mg Oral QHS   temazepam  30 mg Oral QHS   torsemide  20 mg Oral Daily   Continuous Infusions:  PRN Meds: acetaminophen, azelastine, guaiFENesin, hydrALAZINE, ipratropium-albuterol, methocarbamol, metoprolol tartrate, nitroGLYCERIN, ondansetron (ZOFRAN) IV, senna-docusate, traMADol, traZODone  Allergies:    Allergies  Allergen Reactions   Ezetimibe Diarrhea and Nausea Only   Hydrocodone-Acetaminophen Other (See Comments)   Metoclopramide Diarrhea   Propoxyphene Other (See Comments)    Unsure of reaction type Unsure of reaction type Unsure of reaction type   Cefuroxime    Hydrocodone Other (See Comments)   Tramadol Hives   Ceftin [Cefuroxime Axetil] Diarrhea   Codeine Rash        Penicillins Rash and Other (See Comments)    Has patient had a PCN reaction causing immediate rash, facial/tongue/throat swelling, SOB or lightheadedness with hypotension: NO Has patient had a PCN reaction causing severe rash involving mucus membranes or skin necrosis: NO Has patient had a PCN retioion that required hospitalization NO Has patient had a PCN reaction occurring within the last 10 years: NO If all of the above answers are "NO", then may proceed with Cephalosporin use.   Statins Other (See Comments)    Leg pain Leg pain Other reaction(s): restless leg syndrome   Vicodin [Hydrocodone-Acetaminophen] Other (See Comments)    passes out    Social History:   Social History   Socioeconomic History   Marital status: Married    Spouse name: Not on file   Number of children: Not on file   Years of education: Not on file   Highest education level: Not on file  Occupational History    Occupation: retired  Tobacco Use   Smoking status: Never   Smokeless tobacco: Never  Vaping Use   Vaping Use: Never used  Substance and Sexual Activity   Alcohol use: No   Drug use: No   Sexual activity: Not on file  Other Topics Concern   Not on file  Social History Narrative   Not on file   Social Determinants of Health   Financial Resource Strain: Not on file  Food Insecurity: Not on file  Transportation Needs: Not on file  Physical Activity: Not on file  Stress: Not on file  Social Connections: Not on file  Intimate Partner Violence: Not on file    Family History:    Family History  Problem Relation Age of Onset   Heart disease Mother    Breast cancer Mother 11   Heart disease Father    Heart attack Sister    Heart disease Sister    Heart disease Brother      ROS:  Please see the history of present illness.  Review of Systems  Constitutional:  Positive for malaise/fatigue.  Respiratory:  Positive for shortness of breath.   Cardiovascular:  Positive for chest pain and leg swelling.  All other ROS reviewed and negative.     Physical Exam/Data:   Vitals:   12/25/21 0430 12/25/21 0945 12/25/21 1000 12/25/21 1200  BP: 111/65 (!) 168/72 (!) 144/74 (!) 153/90  Pulse: (!) 103 (!) 111 (!) 109 (!) 102  Resp: '20 15  17  '$ Temp:      TempSrc:      SpO2: 93% 95% 94% 95%  Weight:      Height:       No intake or output data in the 24 hours ending 12/25/21 1245    12/24/2021    6:57 PM 09/25/2021   11:21 AM 08/18/2021    7:51 AM  Last 3 Weights  Weight (lbs) 214 lb 213 lb 6 oz 207 lb 6 oz  Weight (kg) 97.07 kg 96.786 kg 94.065 kg     Body mass index is 40.43 kg/m.  General:  Well nourished, well developed, in no acute distress HEENT: normal Neck: no JVD Vascular: No carotid bruits; Distal pulses 2+ bilaterally Cardiac:  normal S1, S2; RRR; no murmur  Lungs:  clear to auscultation bilaterally, no wheezing, rhonchi or rales  Abd: soft, nontender, no  hepatomegaly  Ext: no edema Musculoskeletal:  No deformities, BUE and BLE strength normal and equal Skin: warm and dry  Neuro:  CNs 2-12 intact, no focal abnormalities noted Psych:  Normal affect   EKG:  The EKG was personally reviewed and demonstrates:  sinus tachycardia rate 104 Telemetry:  Telemetry was personally reviewed and demonstrates:  sinus rate of 94  Relevant CV Studies: Coronary CTA 08/10/2021 1. Coronary calcium score of 897. This was 92nd percentile for age and sex matched control.   2. Normal coronary origin with left dominance.   3. Calcified plaque causing moderate proximal LAD and D1 stenosis (50-69%).   4. Calcified plaque causing mild proximal RCA and mid LCx stenosis (25-49%).   5. CAD-RADS 3. Moderate stenosis. Consider symptom-guided anti-ischemic pharmacotherapy as well as risk factor modification per guideline directed care.   6. Additional analysis with CT FFR will be submitted and reported separately.  TTE 09/08/2020  1. Left ventricular ejection fraction, by estimation, is 60 to 65%. The  left ventricle has normal function. The left ventricle has no regional  wall motion abnormalities. Left ventricular diastolic parameters are  consistent with Grade II diastolic  dysfunction (pseudonormalization).   2. Right ventricular systolic function is normal. The right ventricular  size is normal. Tricuspid regurgitation signal is inadequate for assessing  PA pressure.  Laboratory Data:  High Sensitivity Troponin:   Recent Labs  Lab 12/24/21 1855 12/24/21 2249  TROPONINIHS 4 3     Chemistry Recent Labs  Lab 12/24/21 1855  NA 138  K 3.8  CL 100  CO2 30  GLUCOSE 209*  BUN 24*  CREATININE 1.39*  CALCIUM 9.3  GFRNONAA 39*  ANIONGAP 8    Recent Labs  Lab 12/24/21 1855  PROT 7.4  ALBUMIN 4.0  AST 17  ALT 17  ALKPHOS 141*  BILITOT 0.6   Lipids No results for input(s): "CHOL", "TRIG", "HDL", "LABVLDL", "LDLCALC", "CHOLHDL" in the last  168 hours.  Hematology Recent Labs  Lab 12/24/21 1855  WBC 7.3  RBC 4.40  HGB 11.6*  HCT 37.0  MCV 84.1  MCH 26.4  MCHC 31.4  RDW 15.6*  PLT 249   Thyroid No results for input(s): "TSH", "FREET4" in the last 168 hours.  BNP Recent Labs  Lab 12/24/21 1855  BNP 21.3  DDimer  Recent Labs  Lab 12/24/21 2249  DDIMER 0.44     Radiology/Studies:  DG Chest 2 View  Result Date: 12/24/2021 CLINICAL DATA:  Shortness of breath. Increased work of breathing and weakness. EXAM: CHEST - 2 VIEW COMPARISON:  Chest radiograph dated August 04, 2021 FINDINGS: The heart size and mediastinal contours are within normal limits. Mild aortic atherosclerotic calcifications. Left access pacemaker with leads in the right atrium and right ventricle. Partially imaged lumbar spine hardware. Elevation of the right hemidiaphragm, unchanged. IMPRESSION: 1. No active cardiopulmonary disease. 2. Elevation of the right hemidiaphragm, unchanged. Electronically Signed   By: Keane Police D.O.   On: 12/24/2021 20:21     Assessment and Plan:   Acute on chronic dyspnea -Likely multifactorial nature with combination of restrictive lung disease, diastolic congestive heart failure, deconditioning, OSA -BNP is normal, D-dimer is negative, and high-sensitivity troponins are negative x2 -Minimal improvement on steroids and nebulizers -Currently remains on room air -Becomes tachypneic while talking -Possible anginal equivalent -If her cath is unrevealing she may need further work-up for infiltrative causes of diastolic heart failure including amyloidosis  Coronary artery disease involving native coronary arteries with angina -Currently chest pain-free on exam -Has continued to have chest discomfort and has taken sublingual nitro as frequently as yesterday x2 doses prior to arrival to the emergency department -No ischemic changes noted on EKG -Last heart catheterization in 2019 revealed stenosis of the LAD and   D1 -Coronary CTA completed with negative FFR -With history of known coronary artery disease with progressive worsening shortness of breath she has been scheduled for right and left heart catheterization to reevaluate coronaries and pulmonary pressures -High-sensitivity troponins trended and negative x2, no heparin drip started at this time -Continued on aspirin, diltiazem and Ranexa, she has known statin intolerance and takes Praluent -Previously did not tolerate Imdur due to flushing and sweating, patient is amendable to trial low-dose Imdur 15 mg daily along with Ranexa 1000 mg twice daily if needed -Continued on as needed Nitrostat -Continue cardiac monitoring -EKG as needed for chest pain or changes  Chronic diastolic congestive heart failure -Last echocardiogram was completed 08/2020 with an LVEF of 60-65% -Echocardiogram ordered and pending -She is continued on torsemide to 20 mg daily, she has had up titration of torsemide due to the shortness of breath and swelling in the outpatient setting followed by her PCP -Chronic dyspnea and peripheral edema on exam -Daily weight, I's and O's, low-sodium diet  Restrictive lung disease -Followed by pulmonary as outpatient -Restrictive lung disease found on spirometry 04/2021 -Continue bronchodilators and prednisone -Managed by primary team  Chronic kidney disease stage IIIa -Serum creatinine 1.39 -Baseline serum creatinine 1.18-1.20 -Daily BMP -Monitor urine output -Monitor/trend/replete electrolytes as needed -Avoid nephrotoxic agents were able  Cardiac pacemaker in situ -Last interrogation completed 11/24/2021 -Device remote reviewed, remains normal, battery status is good, lead measurements are unchanged, histograms were appropriate -Follows with Dr. Caryl Comes  Hypertension -Blood pressure 153/90 -Continued on diltiazem 120 mg twice daily and torsemide 20 mg daily -Vital signs per unit protocol  Type 2 diabetes -Jardiance  discontinued for Procedure tomorrow as recommendations to discontinue 24 hours prior to procedure -Can be restarted after procedure is completed -Continued on insulin -Hemoglobin A1c 6.4 on 11/17/2021 -Management per primary team  Hypothyroidism -Continued on levothyroxine -Management per primary team  Obstructive sleep apnea -Sleeps with CPAP nightly  Shared Decision Making/Informed Consent The risks [stroke (1 in 1000), death (1 in 1000), kidney failure [usually temporary] (  1 in 500), bleeding (1 in 200), allergic reaction [possibly serious] (1 in 200)], benefits (diagnostic support and management of coronary artery disease) and alternatives of a cardiac catheterization were discussed in detail with [name] and she is willing to proceed.    Risk Assessment/Risk Scores:       For questions or updates, please contact [place] Please consult [email or web address] for contact info under    Signed, Paloma Grange, NP  12/25/2021 12:45 PM

## 2021-12-25 NOTE — ED Notes (Signed)
Pt no longer takes atenolol, states she takes Diltiazem - paged Dr. Reesa Chew to update [facility]

## 2021-12-25 NOTE — Progress Notes (Signed)
PROGRESS NOTE    [name]  IZT:[id number] DOB: February 14, 1945 DOA: 12/24/2021 PCP: Rusty Aus, MD   Brief Narrative:  76 year old with history of obesity, diastolic CHF, CKD stage IIIa, OSA on CPAP, insulin-dependent DM2, HTN, CAD with nonobstructive disease, pacemaker 2021 admitted for shortness of breath and chest tightness.  Patient follows with outpatient cardiology and pulmonary for history of CHF and moderate restrictive lung disease.  Upon admission chest x-ray was overall negative, BNP normal, EKG unremarkable.  Patient was started on nebulizers and Solu-Medrol with symptomatic improvement.   Assessment & Plan:  Principal Problem:   Acute on chronic dyspnea Active Problems:   Coronary artery disease   Chronic diastolic heart failure (HCC)   Restrictive lung disease on spirometry 04/2021   Acute renal failure superimposed on stage 3a chronic kidney disease (HCC)   Physical deconditioning   Morbid obesity ([place])   Uncontrolled type 2 diabetes mellitus with hyperglycemia, with long-term current use of insulin (HCC)   OSA on CPAP   HTN (hypertension)   Hypothyroidism   Status post placement of cardiac pacemaker     Assessment and Plan: * Acute on chronic dyspnea Left-sided jaw pain Patient's symptoms are multifactorial in nature with combination of OSA/OHS, possible restrictive lung disease, CHF, deconditioning.  Each issues discussed below. BNP is normal, troponins remain flat.  D-dimer is negative. Supplemental oxygen as needed Antitussives as needed Incentive spirometry  Coronary artery disease Currently patient is without chest pain.  Troponins remain flat, EKG is nonischemic.  Plan to continue aspirin, atenolol, Imdur, Ranexa and torsemide.  She can get as needed hydromorphone and morphine Coronary CTA in June 2023 showed nonobstructive disease and follows with Dr. Rockey Situ outpatient.  Wonder if patient would benefit from RHC/LHC to reevaluate her coronaries and  pulmonary pressures.  Will discuss with cardiology.   Restrictive lung disease on spirometry 04/2021 Seen by outpatient pulmonary Dr. Raul Del.  For now continue bronchodilators, prednisone, I-S/flutter valve May benefit from outpatient high-resolution CT scan  Chronic diastolic heart failure (HCC) Echocardiogram in July 2023 showed EF 65%, grade 2 DD.  Currently appears to be euvolemic in nature therefore should continue home medications including p.o. torsemide  Physical deconditioning PT/OT  stage 3a chronic kidney disease ([place]) Patient does not have AKI.  Baseline creatinine is 1.3.  Uncontrolled type 2 diabetes mellitus with hyperglycemia, with long-term current use of insulin ([place]) Continue home medication along with sliding scale and Accu-Cheks.  Morbid obesity ([place]) Complicating factor to overall prognosis and care  Status post placement of cardiac pacemaker No acute issues suspected Followed by Dr. Caryl Comes  Hypothyroidism Continue levothyroxine  HTN (hypertension) Continue atenolol.  OSA on CPAP CPAP nightly   DVT prophylaxis: Lovenox Code Status: Full code Family Communication: Husband at bedside  Maintain hospital stay for evaluation of shortness of breath, left-sided jaw pain     Subjective: Seen and examined at bedside, tells me that she feels short of breath with minimal exertion especially with left-sided neck pain and sometimes chest pressure.  This symptoms were made worse and very intense yesterday which prompted her to come to the hospital.   Examination:  General exam: Appears calm and comfortable  Respiratory system: Clear to auscultation. Respiratory effort normal. Cardiovascular system: S1 & S2 heard, RRR. No JVD, murmurs, rubs, gallops or clicks. No pedal edema. Gastrointestinal system: Abdomen is nondistended, soft and nontender. No organomegaly or masses felt. Normal bowel sounds heard. Central nervous system: Alert and oriented. No focal  neurological deficits.  Extremities: Symmetric 5 x 5 power. Skin: No rashes, lesions or ulcers Psychiatry: Judgement and insight appear normal. Mood & affect appropriate.     Objective: Vitals:   12/24/21 2254 12/25/21 0100 12/25/21 0310 12/25/21 0430  BP:  (!) 141/85  111/65  Pulse:  98  (!) 103  Resp:  16  20  Temp: 97.8 F (36.6 C)  98.5 F (36.9 C)   TempSrc: Oral  Oral   SpO2:  94%  93%  Weight:      Height:       No intake or output data in the 24 hours ending 12/25/21 0827 Filed Weights   12/24/21 1857  Weight: 97.1 kg     Data Reviewed:   CBC: Recent Labs  Lab 12/24/21 1855  WBC 7.3  HGB 11.6*  HCT 37.0  MCV 84.1  PLT 017   Basic Metabolic Panel: Recent Labs  Lab 12/24/21 1855  NA 138  K 3.8  CL 100  CO2 30  GLUCOSE 209*  BUN 24*  CREATININE 1.39*  CALCIUM 9.3   GFR: Estimated Creatinine Clearance: 36.7 mL/min (A) (by C-G formula based on SCr of 1.39 mg/dL (H)). Liver Function Tests: Recent Labs  Lab 12/24/21 1855  AST 17  ALT 17  ALKPHOS 141*  BILITOT 0.6  PROT 7.4  ALBUMIN 4.0   No results for input(s): "LIPASE", "AMYLASE" in the last 168 hours. No results for input(s): "AMMONIA" in the last 168 hours. Coagulation Profile: No results for input(s): "INR", "PROTIME" in the last 168 hours. Cardiac Enzymes: No results for input(s): "CKTOTAL", "CKMB", "CKMBINDEX", "TROPONINI" in the last 168 hours. BNP (last 3 results) No results for input(s): "PROBNP" in the last 8760 hours. HbA1C: Recent Labs    12/24/21 2249  HGBA1C 6.2*   CBG: Recent Labs  Lab 12/24/21 2338 12/25/21 0757  GLUCAP 141* 229*   Lipid Profile: No results for input(s): "CHOL", "HDL", "LDLCALC", "TRIG", "CHOLHDL", "LDLDIRECT" in the last 72 hours. Thyroid Function Tests: No results for input(s): "TSH", "T4TOTAL", "FREET4", "T3FREE", "THYROIDAB" in the last 72 hours. Anemia Panel: No results for input(s): "VITAMINB12", "FOLATE", "FERRITIN", "TIBC", "IRON",  "RETICCTPCT" in the last 72 hours. Sepsis Labs: No results for input(s): "PROCALCITON", "LATICACIDVEN" in the last 168 hours.  No results found for this or any previous visit (from the past 240 hour(s)).       Radiology Studies: DG Chest 2 View  Result Date: 12/24/2021 CLINICAL DATA:  Shortness of breath. Increased work of breathing and weakness. EXAM: CHEST - 2 VIEW COMPARISON:  Chest radiograph dated August 04, 2021 FINDINGS: The heart size and mediastinal contours are within normal limits. Mild aortic atherosclerotic calcifications. Left access pacemaker with leads in the right atrium and right ventricle. Partially imaged lumbar spine hardware. Elevation of the right hemidiaphragm, unchanged. IMPRESSION: 1. No active cardiopulmonary disease. 2. Elevation of the right hemidiaphragm, unchanged. Electronically Signed   By: Keane Police D.O.   On: 12/24/2021 20:21        Scheduled Meds:  aspirin  81 mg Oral QHS   atenolol  12.5 mg Oral BH-q7a   enoxaparin (LOVENOX) injection  0.5 mg/kg Subcutaneous Q24H   insulin aspart  0-20 Units Subcutaneous TID WC   insulin aspart  0-5 Units Subcutaneous QHS   insulin glargine-yfgn  20 Units Subcutaneous Daily   ipratropium-albuterol  3 mL Nebulization Q6H   isosorbide mononitrate  15 mg Oral Daily   levothyroxine  75 mcg Oral Q0600   linagliptin  5  mg Oral Daily   methylPREDNISolone (SOLU-MEDROL) injection  40 mg Intravenous Q12H   Followed by   Derrill Memo ON 12/26/2021] predniSONE  40 mg Oral Q breakfast   potassium chloride SA  20 mEq Oral Daily   ranolazine  1,000 mg Oral BID   ropinirole  5 mg Oral QHS   temazepam  30 mg Oral QHS   torsemide  20 mg Oral Daily   Continuous Infusions:   LOS: 0 days   Time spent= 35 mins    Valari Taylor Arsenio Loader, MD [facility]  If 7PM-7AM, please contact night-coverage  12/25/2021, 8:27 AM

## 2021-12-25 NOTE — Evaluation (Signed)
Occupational Therapy Evaluation Patient Details Name: [name] MRN: [id number] DOB: [birth year] Today's Date: 12/25/2021   History of Present Illness Patient is a 76 year old female with brought in by EMS for evaluation of acute worsening of chronic shortness of breath and associated chest tightness and jaw pain. History of  diastolic CHF, CAD, CKD, CAD, diabetes, back surgery   Clinical Impression   Pt was seen for OT evaluation this date. Prior to hospital admission, pt was ambulating with a rollator, mod indep with basic ADL, and generally indep with IADL. Pt lives with her spouse who can assist 24/7. Also has a sister and brother in law who are supportive (present for start of session). Pt presents to acute OT demonstrating impaired ADL performance and functional mobility 2/2 impaired balance, activity tolerance, back pain, and endorsing feeling of SOB (See OT problem list). VSS throughout activity. Pt currently requires supv for ADL transfers and mobility with RW, PRN MIN A for LB ADL tasks, supv for toileting. Pt educated in energy conservation including activity pacing, home/routines modifications, and PLB. Pt verbalized understanding. Pt would benefit from skilled OT services to address noted impairments and functional limitations (see below for any additional details) in order to maximize safety and independence while minimizing falls risk and caregiver burden. Upon hospital discharge, recommend HHOT to maximize pt safety and return to functional independence during meaningful occupations of daily life.    Recommendations for follow up therapy are one component of a multi-disciplinary discharge planning process, led by the attending physician.  Recommendations may be updated based on patient status, additional functional criteria and insurance authorization.   Follow Up Recommendations  Home health OT     Assistance Recommended at Discharge PRN  Patient can return home with the  following A little help with walking and/or transfers;A little help with bathing/dressing/bathroom;Assistance with cooking/housework;Assist for transportation;Help with stairs or ramp for entrance    Functional Status Assessment  Patient has had a recent decline in their functional status and demonstrates the ability to make significant improvements in function in a reasonable and predictable amount of time.  Equipment Recommendations  None recommended by OT    Recommendations for Other Services       Precautions / Restrictions Precautions Precautions: Fall Restrictions Weight Bearing Restrictions: No      Mobility Bed Mobility Overal bed mobility: Needs Assistance Bed Mobility: Supine to Sit, Sit to Supine     Supine to sit: Supervision, HOB elevated Sit to supine: Supervision   General bed mobility comments: increased effort    Transfers Overall transfer level: Needs assistance Equipment used: Rolling walker (2 wheels) Transfers: Sit to/from Stand Sit to Stand: Supervision                  Balance Overall balance assessment: Needs assistance Sitting-balance support: Feet supported Sitting balance-Leahy Scale: Fair     Standing balance support: No upper extremity supported, During functional activity Standing balance-Leahy Scale: Fair Standing balance comment: stood at sink to wash hands without UE support and without notable LOB                           ADL either performed or assessed with clinical judgement   ADL  General ADL Comments: Pt requires supv for ADL transfers/mobility with RW, PRN MIN A for LB ADL Tasks, and supv for seated bathing and toileting.     Vision         Perception     Praxis      Pertinent Vitals/Pain Pain Assessment Pain Assessment: 0-10 Pain Score: 4  Pain Location: sacrum/coccyx, jaw Pain Descriptors / Indicators: Discomfort, Aching Pain  Intervention(s): Limited activity within patient's tolerance, Monitored during session, Repositioned     Hand Dominance     Extremity/Trunk Assessment Upper Extremity Assessment Upper Extremity Assessment: Overall WFL for tasks assessed   Lower Extremity Assessment Lower Extremity Assessment: RLE deficits/detail;Defer to PT evaluation RLE Deficits / Details: mild residual deficits following back surgery in RLE that are unchanged from baseline   Cervical / Trunk Assessment Cervical / Trunk Assessment: Normal   Communication Communication Communication: No difficulties   Cognition Arousal/Alertness: Awake/alert Behavior During Therapy: Anxious Overall Cognitive Status: Within Functional Limits for tasks assessed                                       General Comments       Exercises Other Exercises Other Exercises: Pt educated in energy conservation including activity pacing, home/routines modifications, and PLB   Shoulder Instructions      Home Living Family/patient expects to be discharged to:: Private residence Living Arrangements: Spouse/significant other Available Help at Discharge: Family;Available 24 hours/day Type of Home: House Home Access: Stairs to enter [employer] of Steps: 1 Entrance Stairs-Rails:  (grab bar) Home Layout: One level     Bathroom Shower/Tub: Walk-in shower         Home Equipment: Conservation officer, nature (2 wheels);Cane - single point;Shower seat;Adaptive equipment Adaptive Equipment: Reacher        Prior Functioning/Environment Prior Level of Function : Driving;Independent/Modified Independent             Mobility Comments: using cane or rolling walker intermittently as needed ADLs Comments: Mod I, using shower chair PRN for bathing        OT Problem List: Decreased strength;Decreased activity tolerance;Impaired balance (sitting and/or standing);Decreased knowledge of use of DME or AE      OT  Treatment/Interventions: Self-care/ADL training;Therapeutic exercise;Therapeutic activities;Energy conservation;DME and/or AE instruction;Patient/family education;Balance training    OT Goals(Current goals can be found in the care plan section) Acute Rehab OT Goals Patient Stated Goal: breathe better OT Goal Formulation: With patient Time For Goal Achievement: 01/08/22 Potential to Achieve Goals: Good ADL Goals Pt Will Transfer to Toilet: with modified independence;ambulating (LRAD) Additional ADL Goal #1: Pt will complete all aspects of bathing with mod indep, primarily seated, 2/2 opportunities. Additional ADL Goal #2: Pt will verbalize plan to implement at least 1 learned ECS/falls prevention strategy to maximize safety/indep and minimize over exertion and SOB.  OT Frequency: Min 2X/week    Co-evaluation              AM-PAC OT "6 Clicks" Daily Activity     Outcome Measure Help from another person eating meals?: None Help from another person taking care of personal grooming?: None Help from another person toileting, which includes using toliet, bedpan, or urinal?: A Little Help from another person bathing (including washing, rinsing, drying)?: A Little Help from another person to put on and taking off regular upper body clothing?: None Help from another person to put on and  taking off regular lower body clothing?: A Little 6 Click Score: 21   End of Session    Activity Tolerance: Patient tolerated treatment well Patient left: in bed;with call bell/phone within reach  OT Visit Diagnosis: Other abnormalities of gait and mobility (R26.89);Muscle weakness (generalized) (M62.81)                Time: 0404-5913 OT Time Calculation (min): 23 min Charges:  OT General Charges $OT Visit: 1 Visit OT Evaluation $OT Eval Low Complexity: 1 Low OT Treatments $Self Care/Home Management : 8-22 mins  Ardeth Perfect., MPH, MS, OTR/L ascom [phone] 12/25/21, 4:11 PM

## 2021-12-25 NOTE — ED Notes (Signed)
Report to Leala, RN  

## 2021-12-25 NOTE — ED Notes (Signed)
Admitting MD at bedside.

## 2021-12-26 ENCOUNTER — Observation Stay (HOSPITAL_COMMUNITY)
Admit: 2021-12-26 | Discharge: 2021-12-26 | Disposition: A | Discharge status: Home / Self Care | Payer: Medicare Other | Attending: Cardiology | Admitting: Cardiology

## 2021-12-26 ENCOUNTER — Encounter
Admission: EM | Disposition: A | Discharge status: Home Health | Payer: Self-pay | Source: Home / Self Care | Attending: Internal Medicine

## 2021-12-26 DIAGNOSIS — J984 Other disorders of lung: Secondary | ICD-10-CM | POA: Diagnosis present

## 2021-12-26 DIAGNOSIS — R0602 Shortness of breath: Secondary | ICD-10-CM | POA: Diagnosis present

## 2021-12-26 DIAGNOSIS — N179 Acute kidney failure, unspecified: Secondary | ICD-10-CM | POA: Diagnosis present

## 2021-12-26 DIAGNOSIS — Z6841 Body Mass Index (BMI) 40.0 and over, adult: Secondary | ICD-10-CM | POA: Diagnosis not present

## 2021-12-26 DIAGNOSIS — I5033 Acute on chronic diastolic (congestive) heart failure: Secondary | ICD-10-CM

## 2021-12-26 DIAGNOSIS — K219 Gastro-esophageal reflux disease without esophagitis: Secondary | ICD-10-CM | POA: Diagnosis present

## 2021-12-26 DIAGNOSIS — D72828 Other elevated white blood cell count: Secondary | ICD-10-CM | POA: Diagnosis present

## 2021-12-26 DIAGNOSIS — I25110 Atherosclerotic heart disease of native coronary artery with unstable angina pectoris: Secondary | ICD-10-CM | POA: Diagnosis present

## 2021-12-26 DIAGNOSIS — N189 Chronic kidney disease, unspecified: Secondary | ICD-10-CM | POA: Diagnosis not present

## 2021-12-26 DIAGNOSIS — Z803 Family history of malignant neoplasm of breast: Secondary | ICD-10-CM | POA: Diagnosis not present

## 2021-12-26 DIAGNOSIS — R0609 Other forms of dyspnea: Secondary | ICD-10-CM | POA: Diagnosis not present

## 2021-12-26 DIAGNOSIS — E1165 Type 2 diabetes mellitus with hyperglycemia: Secondary | ICD-10-CM | POA: Diagnosis present

## 2021-12-26 DIAGNOSIS — I200 Unstable angina: Secondary | ICD-10-CM | POA: Diagnosis not present

## 2021-12-26 DIAGNOSIS — I2510 Atherosclerotic heart disease of native coronary artery without angina pectoris: Secondary | ICD-10-CM | POA: Diagnosis not present

## 2021-12-26 DIAGNOSIS — Z8249 Family history of ischemic heart disease and other diseases of the circulatory system: Secondary | ICD-10-CM | POA: Diagnosis not present

## 2021-12-26 DIAGNOSIS — G2581 Restless legs syndrome: Secondary | ICD-10-CM | POA: Diagnosis present

## 2021-12-26 DIAGNOSIS — Z794 Long term (current) use of insulin: Secondary | ICD-10-CM | POA: Diagnosis not present

## 2021-12-26 DIAGNOSIS — T380X5A Adverse effect of glucocorticoids and synthetic analogues, initial encounter: Secondary | ICD-10-CM | POA: Diagnosis present

## 2021-12-26 DIAGNOSIS — E785 Hyperlipidemia, unspecified: Secondary | ICD-10-CM | POA: Diagnosis present

## 2021-12-26 DIAGNOSIS — Z79899 Other long term (current) drug therapy: Secondary | ICD-10-CM | POA: Diagnosis not present

## 2021-12-26 DIAGNOSIS — E1151 Type 2 diabetes mellitus with diabetic peripheral angiopathy without gangrene: Secondary | ICD-10-CM | POA: Diagnosis present

## 2021-12-26 DIAGNOSIS — F32A Depression, unspecified: Secondary | ICD-10-CM | POA: Diagnosis present

## 2021-12-26 DIAGNOSIS — N1831 Chronic kidney disease, stage 3a: Secondary | ICD-10-CM | POA: Diagnosis present

## 2021-12-26 DIAGNOSIS — I5032 Chronic diastolic (congestive) heart failure: Secondary | ICD-10-CM | POA: Diagnosis not present

## 2021-12-26 DIAGNOSIS — Z90710 Acquired absence of both cervix and uterus: Secondary | ICD-10-CM | POA: Diagnosis not present

## 2021-12-26 DIAGNOSIS — R0600 Dyspnea, unspecified: Secondary | ICD-10-CM | POA: Diagnosis not present

## 2021-12-26 DIAGNOSIS — E039 Hypothyroidism, unspecified: Secondary | ICD-10-CM | POA: Diagnosis present

## 2021-12-26 DIAGNOSIS — E1122 Type 2 diabetes mellitus with diabetic chronic kidney disease: Secondary | ICD-10-CM | POA: Diagnosis present

## 2021-12-26 DIAGNOSIS — Z950 Presence of cardiac pacemaker: Secondary | ICD-10-CM | POA: Diagnosis not present

## 2021-12-26 DIAGNOSIS — E662 Morbid (severe) obesity with alveolar hypoventilation: Secondary | ICD-10-CM | POA: Diagnosis present

## 2021-12-26 DIAGNOSIS — I130 Hypertensive heart and chronic kidney disease with heart failure and stage 1 through stage 4 chronic kidney disease, or unspecified chronic kidney disease: Secondary | ICD-10-CM | POA: Diagnosis present

## 2021-12-26 HISTORY — PX: RIGHT/LEFT HEART CATH AND CORONARY ANGIOGRAPHY: CATH118266

## 2021-12-26 LAB — POCT I-STAT 7, (LYTES, BLD GAS, ICA,H+H)
Acid-Base Excess: 4 mmol/L — ABNORMAL HIGH (ref 0.0–2.0)
Bicarbonate: 27.4 mmol/L (ref 20.0–28.0)
Calcium, Ion: 1.2 mmol/L (ref 1.15–1.40)
HCT: 33 % — ABNORMAL LOW (ref 36.0–46.0)
Hemoglobin: 11.2 g/dL — ABNORMAL LOW (ref 12.0–15.0)
O2 Saturation: 94 %
Potassium: 4.1 mmol/L (ref 3.5–5.1)
Sodium: 140 mmol/L (ref 135–145)
TCO2: 29 mmol/L (ref 22–32)
pCO2 arterial: 37.2 mmHg (ref 32–48)
pH, Arterial: 7.475 — ABNORMAL HIGH (ref 7.35–7.45)
pO2, Arterial: 68 mmHg — ABNORMAL LOW (ref 83–108)

## 2021-12-26 LAB — CBC
HCT: 32.9 % — ABNORMAL LOW (ref 36.0–46.0)
Hemoglobin: 10.5 g/dL — ABNORMAL LOW (ref 12.0–15.0)
MCH: 26.4 pg (ref 26.0–34.0)
MCHC: 31.9 g/dL (ref 30.0–36.0)
MCV: 82.7 fL (ref 80.0–100.0)
Platelets: 256 10*3/uL (ref 150–400)
RBC: 3.98 MIL/uL (ref 3.87–5.11)
RDW: 15.8 % — ABNORMAL HIGH (ref 11.5–15.5)
WBC: 12.6 10*3/uL — ABNORMAL HIGH (ref 4.0–10.5)
nRBC: 0 % (ref 0.0–0.2)

## 2021-12-26 LAB — BASIC METABOLIC PANEL
Anion gap: 9 (ref 5–15)
BUN: 30 mg/dL — ABNORMAL HIGH (ref 8–23)
CO2: 26 mmol/L (ref 22–32)
Calcium: 9.3 mg/dL (ref 8.9–10.3)
Chloride: 104 mmol/L (ref 98–111)
Creatinine, Ser: 1.45 mg/dL — ABNORMAL HIGH (ref 0.44–1.00)
GFR, Estimated: 37 mL/min — ABNORMAL LOW (ref >60)
Glucose, Bld: 216 mg/dL — ABNORMAL HIGH (ref 70–99)
Potassium: 4 mmol/L (ref 3.5–5.1)
Sodium: 139 mmol/L (ref 135–145)

## 2021-12-26 LAB — ECHOCARDIOGRAM COMPLETE
AR max vel: 2.1 cm2
AV Area VTI: 2.38 cm2
AV Area mean vel: 2.1 cm2
AV Mean grad: 4 mmHg
AV Peak grad: 8.2 mmHg
Ao pk vel: 1.43 m/s
Area-P 1/2: 3.93 cm2
Height: 61 in
S' Lateral: 3 cm
Weight: 3424 oz

## 2021-12-26 LAB — GLUCOSE, CAPILLARY
Glucose-Capillary: 203 mg/dL — ABNORMAL HIGH (ref 70–99)
Glucose-Capillary: 192 mg/dL — ABNORMAL HIGH (ref 70–99)
Glucose-Capillary: 128 mg/dL — ABNORMAL HIGH (ref 70–99)
Glucose-Capillary: 329 mg/dL — ABNORMAL HIGH (ref 70–99)
Glucose-Capillary: 198 mg/dL — ABNORMAL HIGH (ref 70–99)

## 2021-12-26 LAB — POCT I-STAT EG7
Acid-Base Excess: 3 mmol/L — ABNORMAL HIGH (ref 0.0–2.0)
Bicarbonate: 27.9 mmol/L (ref 20.0–28.0)
Calcium, Ion: 1.2 mmol/L (ref 1.15–1.40)
HCT: 33 % — ABNORMAL LOW (ref 36.0–46.0)
Hemoglobin: 11.2 g/dL — ABNORMAL LOW (ref 12.0–15.0)
O2 Saturation: 69 %
Potassium: 4.1 mmol/L (ref 3.5–5.1)
Sodium: 141 mmol/L (ref 135–145)
TCO2: 29 mmol/L (ref 22–32)
pCO2, Ven: 41.8 mmHg — ABNORMAL LOW (ref 44–60)
pH, Ven: 7.433 — ABNORMAL HIGH (ref 7.25–7.43)
pO2, Ven: 35 mmHg (ref 32–45)

## 2021-12-26 LAB — MAGNESIUM: Magnesium: 2.7 mg/dL — ABNORMAL HIGH (ref 1.7–2.4)

## 2021-12-26 LAB — LIPOPROTEIN A (LPA): Lipoprotein (a): <8.4 nmol/L (ref <75.0)

## 2021-12-26 SURGERY — RIGHT/LEFT HEART CATH AND CORONARY ANGIOGRAPHY
Anesthesia: Moderate Sedation

## 2021-12-26 MED ORDER — SODIUM CHLORIDE 0.9 % WEIGHT BASED INFUSION: 3.0000 mL/kg/h | INTRAVENOUS | Status: DC

## 2021-12-26 MED ORDER — LIDOCAINE HCL (PF) 1 % IJ SOLN
INTRAMUSCULAR | Status: DC | PRN
  Administered 2021-12-26 (×2): 2 mL

## 2021-12-26 MED ORDER — HEPARIN SODIUM (PORCINE) 1000 UNIT/ML IJ SOLN
INTRAMUSCULAR | Status: AC
  Filled 2021-12-26: qty 10

## 2021-12-26 MED ORDER — PERFLUTREN LIPID MICROSPHERE
1.0000 mL | INTRAVENOUS | Status: AC | PRN
  Administered 2021-12-26: 2 mL via INTRAVENOUS

## 2021-12-26 MED ORDER — SODIUM CHLORIDE 0.9% FLUSH: 3.0000 mL | INTRAVENOUS | Status: DC | PRN

## 2021-12-26 MED ORDER — FENTANYL CITRATE (PF) 100 MCG/2ML IJ SOLN
INTRAMUSCULAR | Status: AC
  Filled 2021-12-26: qty 2

## 2021-12-26 MED ORDER — MIDAZOLAM HCL 2 MG/2ML IJ SOLN
INTRAMUSCULAR | Status: DC | PRN
  Administered 2021-12-26: 1 mg via INTRAVENOUS

## 2021-12-26 MED ORDER — ASPIRIN 81 MG PO CHEW: 81.0000 mg | CHEWABLE_TABLET | ORAL | Status: DC

## 2021-12-26 MED ORDER — FENTANYL CITRATE (PF) 100 MCG/2ML IJ SOLN
INTRAMUSCULAR | Status: DC | PRN
  Administered 2021-12-26: 25 ug via INTRAVENOUS

## 2021-12-26 MED ORDER — HEPARIN (PORCINE) IN NACL 1000-0.9 UT/500ML-% IV SOLN
INTRAVENOUS | Status: DC | PRN
  Administered 2021-12-26: 1000 mL

## 2021-12-26 MED ORDER — ASPIRIN 81 MG PO CHEW
81.0000 mg | CHEWABLE_TABLET | ORAL | Status: AC
  Administered 2021-12-26: 81 mg via ORAL
  Filled 2021-12-26: qty 1

## 2021-12-26 MED ORDER — SODIUM CHLORIDE 0.9% FLUSH
3.0000 mL | Freq: Two times a day (BID) | INTRAVENOUS | Status: DC
  Administered 2021-12-26 – 2021-12-27 (×2): 3 mL via INTRAVENOUS

## 2021-12-26 MED ORDER — VERAPAMIL HCL 2.5 MG/ML IV SOLN
INTRAVENOUS | Status: DC | PRN
  Administered 2021-12-26 (×2): 2.5 mg via INTRA_ARTERIAL

## 2021-12-26 MED ORDER — IOHEXOL 300 MG/ML  SOLN
INTRAMUSCULAR | Status: DC | PRN
  Administered 2021-12-26: 36 mL

## 2021-12-26 MED ORDER — HEPARIN SODIUM (PORCINE) 1000 UNIT/ML IJ SOLN
INTRAMUSCULAR | Status: DC | PRN
  Administered 2021-12-26: 5000 [IU] via INTRAVENOUS

## 2021-12-26 MED ORDER — SODIUM CHLORIDE 0.9 % IV SOLN: 250.0000 mL | INTRAVENOUS | Status: DC | PRN

## 2021-12-26 MED ORDER — HYDRALAZINE HCL 20 MG/ML IJ SOLN: 10.0000 mg | INTRAMUSCULAR | Status: AC | PRN

## 2021-12-26 MED ORDER — SODIUM CHLORIDE 0.9 % WEIGHT BASED INFUSION: 1.0000 mL/kg/h | INTRAVENOUS | Status: DC

## 2021-12-26 MED ORDER — LIDOCAINE HCL 1 % IJ SOLN
INTRAMUSCULAR | Status: AC
  Filled 2021-12-26: qty 20

## 2021-12-26 MED ORDER — VERAPAMIL HCL 2.5 MG/ML IV SOLN
INTRAVENOUS | Status: AC
  Filled 2021-12-26: qty 2

## 2021-12-26 MED ORDER — LABETALOL HCL 5 MG/ML IV SOLN: 10.0000 mg | INTRAVENOUS | Status: AC | PRN

## 2021-12-26 MED ORDER — INSULIN GLARGINE-YFGN 100 UNIT/ML ~~LOC~~ SOLN
25.0000 [IU] | Freq: Every day | SUBCUTANEOUS | Status: DC
  Administered 2021-12-26 – 2021-12-27 (×2): 25 [IU] via SUBCUTANEOUS
  Filled 2021-12-26 (×2): qty 0.25

## 2021-12-26 MED ORDER — MIDAZOLAM HCL 2 MG/2ML IJ SOLN
INTRAMUSCULAR | Status: AC
  Filled 2021-12-26: qty 2

## 2021-12-26 MED ORDER — HEPARIN (PORCINE) IN NACL 1000-0.9 UT/500ML-% IV SOLN
INTRAVENOUS | Status: AC
  Filled 2021-12-26: qty 1000

## 2021-12-26 SURGICAL SUPPLY — 14 items
BAND ZEPHYR COMPRESS 30 LONG (HEMOSTASIS) IMPLANT
CATH 5F 110X4 TIG (CATHETERS) IMPLANT
CATH INFINITI 5FR ANG PIGTAIL (CATHETERS) IMPLANT
CATH SWAN GANZ 7F STRAIGHT (CATHETERS) IMPLANT
DRAPE BRACHIAL (DRAPES) IMPLANT
GLIDESHEATH SLEND SS 6F .021 (SHEATH) IMPLANT
GLIDESHEATH SLENDER 7FR .021G (SHEATH) IMPLANT
GUIDEWIRE .025 260CM (WIRE) IMPLANT
GUIDEWIRE INQWIRE 1.5J.035X260 (WIRE) IMPLANT
INQWIRE 1.5J .035X260CM (WIRE) ×1
PACK CARDIAC CATH (CUSTOM PROCEDURE TRAY) ×1 IMPLANT
PROTECTION STATION PRESSURIZED (MISCELLANEOUS) ×1
SET ATX SIMPLICITY (MISCELLANEOUS) IMPLANT
STATION PROTECTION PRESSURIZED (MISCELLANEOUS) IMPLANT

## 2021-12-26 NOTE — Progress Notes (Signed)
*  PRELIMINARY RESULTS* Echocardiogram 2D Echocardiogram has been performed.  [name] 12/26/2021, 8:12 AM

## 2021-12-26 NOTE — Care Management Obs Status (Signed)
MEDICARE OBSERVATION STATUS NOTIFICATION   Patient Details  Name: [name] MRN: [id number] Date of Birth: 06-14-1945   Medicare Observation Status Notification Given:  Yes    Candie Chroman, LCSW 12/26/2021, 9:43 AM

## 2021-12-26 NOTE — Progress Notes (Signed)
PT Cancellation Note  Patient Details Name: [name] MRN: [id number] DOB: 28-Jan-1946   Cancelled Treatment:    Reason Eval/Treat Not Completed: Patient at procedure or test/unavailable. Will follow up tomorrow or as appropriate.   Minna Merritts, PT, MPT  Percell Locus 12/26/2021, 1:24 PM

## 2021-12-26 NOTE — Progress Notes (Signed)
Nutrition Brief Note  RD consulted for assessment of nutritional requirements/ status.   Wt Readings from Last 15 Encounters:  12/24/21 97.1 kg  09/25/21 96.8 kg  08/18/21 94.1 kg  08/04/21 95 kg  08/04/21 95.2 kg  05/06/21 93.9 kg  04/04/21 97.5 kg  10/19/20 96.8 kg  09/07/20 94.8 kg  07/21/20 92.1 kg  05/09/20 92.5 kg  04/05/20 93.4 kg  03/14/20 92.1 kg  09/11/19 (!) 96.6 kg  06/16/19 97.1 kg   Pt with history of obesity, diastolic CHF, CKD stage IIIa, OSA on CPAP, insulin-dependent DM2, HTN, CAD with nonobstructive disease, pacemaker 2021 admitted for shortness of breath and chest tightness.  Pt admitted with unstable angina.   Plan for cath tomorrow.   Spoke with pt and husband at bedside. Pt reports that she has not eaten much here as she has not been able to choose her meals. Per pt, she typically has a good appetite and cosnumes 2-3 meals per day. Meal consist of oatmeal OR toast, veggie bacon, and eggs, meat, starch, and vegetable. Pt reports her and her husband prepare meals at home and has been focusing on choosing health options.   Pt denies any weight loss. She weighs herself daily RD reviewed CHF and DM management.   Nutrition-Focused physical exam completed. Findings are no fat depletion, no muscle depletion, and mild edema.    Medications reviewed and include vitamin D3, cardizem, potassium chloride, prednisone, and torsemide.   Lab Results  Component Value Date   HGBA1C 6.2 (H) 12/24/2021    PTA DM medications are 5 mg tradjenta daily and 10 mg jardiance daily.   Labs reviewed: CBGS: 192-203 (inpatient orders for glycemic control are 0-20 units insulin aspart TID with meals, 0-5 units insulin aspart daily at bedtime, and 25 units insulin glargine-yfgn daily).    Body mass index is 40.43 kg/m. Patient meets criteria for obesity, class III based on current BMI. Obesity is a complex, chronic medical condition that is optimally managed by a multidisciplinary  care team. Weight loss is not an ideal goal for an acute inpatient hospitalization. However, if further work-up for obesity is warranted, consider outpatient referral to outpatient bariatric service and/or [facility].    Current diet order is heart healthy, carb modified, patient is consuming approximately 100% of meals at this time. Labs and medications reviewed.   No nutrition interventions warranted at this time. If nutrition issues arise, please consult RD.   [name], RD, LDN, [place] Registered Dietitian II Certified Diabetes Care and Education Specialist Please refer to [facility] for RD and/or RD on-call/weekend/after hours pager

## 2021-12-26 NOTE — Discharge Instructions (Signed)

## 2021-12-26 NOTE — TOC Initial Note (Signed)
Transition of Care [facility]) - Initial/Assessment Note    Patient Details  Name: [name] MRN: [id number] Date of Birth: 1945-12-18  Transition of Care [facility]) CM/SW Contact:    Candie Chroman, LCSW Phone Number: 12/26/2021, 9:46 AM  Clinical Narrative:  CSW met with patient. No supports at bedside. CSW introduced role and explained that therapy recommendations would be discussed. Patient is agreeable to home health. She prefers [facility] because she has worked with them in the past. Referral accepted for PT, OT, Therapist, sports. No DME recommendations at this time. No further concerns. CSW encouraged patient to contact CSW as needed. CSW will continue to follow patient for support and facilitate return home once stable.                Expected Discharge Plan: [place] Barriers to Discharge: Continued Medical Work up   Patient Goals and CMS Choice   CMS Medicare.gov Compare Post Acute Care list provided to:: Patient Choice offered to / list presented to : Patient  Expected Discharge Plan and Services Expected Discharge Plan: [place] Choice: [place] arrangements for the past 2 months: [place] Arranged: RN, PT, OT [facility] Agency: [place] ([place]) Date [place]: 12/26/21   Representative spoke with at [employer]: Floydene Flock  Prior Living Arrangements/Services Living arrangements for the past 2 months: [place] Lives with:: Spouse Patient language and need for interpreter reviewed:: Yes Do you feel safe going back to the place where you live?: Yes      Need for Family Participation in Patient Care: Yes (Comment) Care giver support system in place?: Yes (comment)   Criminal Activity/Legal Involvement Pertinent to Current Situation/Hospitalization: No - Comment as needed  Activities of Daily Living Home Assistive Devices/Equipment:  Eyeglasses ADL Screening (condition at time of admission) Patient's cognitive ability adequate to safely complete daily activities?: Yes Is the patient deaf or have difficulty hearing?: No Does the patient have difficulty seeing, even when wearing glasses/contacts?: No Does the patient have difficulty concentrating, remembering, or making decisions?: No Patient able to express need for assistance with ADLs?: Yes Does the patient have difficulty dressing or bathing?: No Independently performs ADLs?: Yes (appropriate for developmental age) Does the patient have difficulty walking or climbing stairs?: Yes Weakness of Legs: Both Weakness of Arms/Hands: None  Permission Sought/Granted Permission sought to share information with : Facility Art therapist granted to share information with : Yes, Verbal Permission Granted     Permission granted to share info w AGENCY: [place]        Emotional Assessment Appearance:: Appears stated age Attitude/Demeanor/Rapport: Engaged, Gracious Affect (typically observed): Accepting, Appropriate, Calm, Pleasant Orientation: : Oriented to Self, Oriented to Place, Oriented to  Time, Oriented to Situation Alcohol / Substance Use: Not Applicable Psych Involvement: No (comment)  Admission diagnosis:  Shortness of breath [R06.02] Acute dyspnea [R06.00] Patient Active Problem List   Diagnosis Date Noted   Restrictive lung disease on spirometry 04/2021 12/24/2021   Uncontrolled type 2 diabetes mellitus with hyperglycemia, with long-term current use of insulin ([place]) 12/24/2021   Chest pain, rule out acute myocardial infarction 09/07/2020   GERD (gastroesophageal reflux disease) 05/08/2020   Restless leg syndrome 05/08/2020   Status post  placement of cardiac pacemaker 04/29/2019   Body mass index (BMI) 38.0-38.9, adult 02/24/2019   Spondylolisthesis, lumbar region 02/24/2019   Tubular adenoma 12/10/2018   CKD (chronic kidney  disease) stage 3, GFR 30-59 ml/min (HCC) 02/27/2018   Lumbar spondylosis 02/18/2018   Inflammatory arthritis 02/13/2018   Bilateral hand pain 01/22/2018   Acquired trigger finger 01/22/2018   Dupuytren's disease of palm 01/22/2018   Elevated erythrocyte sedimentation rate 01/14/2018   Polyarthralgia 01/14/2018   Atrial tachycardia 01/05/2018   History of shingles 12/26/2017   Abnormal cardiac CT angiography    Diverticulitis 07/27/2017   Diverticulitis of large intestine without perforation or abscess without bleeding 07/26/2017   Medicare annual wellness visit, initial 04/15/2017   Sciatica 01/15/2017   Lymphedema 12/18/2016   Adult idiopathic generalized osteoporosis 10/08/2016   Chronic diastolic heart failure ([place]) 08/10/2016   Hypertensive heart disease 07/25/2016   Acute renal failure superimposed on stage 3a chronic kidney disease ([place]) 07/25/2016   Morbid obesity ([place]) 07/25/2016   Physical deconditioning 07/25/2016   Pyuria 06/25/2016   Abdominal pain 06/24/2016   Abscess of back    Lumbar degenerative disc disease 11/14/2015   Chronic low back pain 08/26/2015   Lumbar radiculopathy 08/26/2015   Anxiety 02/24/2014   Lumbar stenosis with neurogenic claudication 11/03/2013   Chest pain 06/09/2012   Coronary artery disease 06/09/2012   Hyperlipidemia 08/09/2011   Arm pain 06/28/2011   Diabetes mellitus ([place]) 05/05/2010   OSA on CPAP 05/05/2010   HTN (hypertension) 05/05/2010   Edema 05/05/2010   Hypothyroidism 02/12/2010   HYPERTRIGLYCERIDEMIA 06/08/2008   Acute on chronic dyspnea 06/08/2008   PCP:  Rusty Aus, MD Pharmacy:   [place], [place] [address] [place] [address] Phone: [phone] Fax: [phone]     Social Determinants of Health (SDOH) Interventions    Readmission Risk Interventions     No data to display

## 2021-12-26 NOTE — Progress Notes (Signed)
Rounding Note    Patient Name: [name] Date of Encounter: 12/26/2021  [place] Cardiologist: Ida Rogue, MD   Subjective   Still short of breath.  No chest pain at this time but notes worsening discomfort over the last several months.  Inpatient Medications    Scheduled Meds:  aspirin  81 mg Oral QHS   budesonide (PULMICORT) nebulizer solution  0.5 mg Nebulization BID   cholecalciferol  1,000 Units Oral Daily   diltiazem  120 mg Oral BID   enoxaparin (LOVENOX) injection  0.5 mg/kg Subcutaneous Q24H   gabapentin  300 mg Oral TID   insulin aspart  0-20 Units Subcutaneous TID WC   insulin aspart  0-5 Units Subcutaneous QHS   insulin glargine-yfgn  25 Units Subcutaneous Daily   levothyroxine  75 mcg Oral Q0600   pantoprazole  40 mg Oral Daily   potassium chloride SA  20 mEq Oral Daily   predniSONE  40 mg Oral Q breakfast   ranolazine  1,000 mg Oral BID   ropinirole  5 mg Oral QHS   sodium chloride flush  3 mL Intravenous Q12H   sodium chloride flush  3 mL Intravenous Q12H   temazepam  30 mg Oral QHS   Continuous Infusions:  sodium chloride     PRN Meds: sodium chloride, acetaminophen, azelastine, guaiFENesin, hydrALAZINE, ipratropium-albuterol, methocarbamol, metoprolol tartrate, nitroGLYCERIN, ondansetron (ZOFRAN) IV, senna-docusate, sodium chloride flush, traMADol, traZODone   Vital Signs    Vitals:   12/26/21 1600 12/26/21 1615 12/26/21 1630 12/26/21 1942  BP:   (!) 110/39 (!) 129/48  Pulse: 72 73 78 80  Resp: (!) '22 18 19   '$ Temp:    97.9 F (36.6 C)  TempSrc:      SpO2: 95% 93% 95% 98%  Weight:      Height:        Intake/Output Summary (Last 24 hours) at 12/26/2021 2115 Last data filed at 12/26/2021 2103 Gross per 24 hour  Intake 486 ml  Output --  Net 486 ml      12/24/2021    6:57 PM 09/25/2021   11:21 AM 08/18/2021    7:51 AM  Last 3 Weights  Weight (lbs) 214 lb 213 lb 6 oz 207 lb 6 oz  Weight (kg) 97.07 kg 96.786 kg  94.065 kg      Telemetry    Sinus rhythm with PVCs- Personally Reviewed  ECG    No new tracing- Personally Reviewed  Physical Exam   GEN: No acute distress.   Neck: Unable to assess due to positioning and body habitus. Cardiac: RRR, no murmurs, rubs, or gallops.  Respiratory: Mildly diminished breath sounds throughout without wheezes or crackles. GI: Soft, nontender, non-distended  MS: Trace pretibial edema; No deformity. Neuro:  Nonfocal  Psych: Normal affect   Labs    High Sensitivity Troponin:   Recent Labs  Lab 12/24/21 1855 12/24/21 2249  TROPONINIHS 4 3     Chemistry Recent Labs  Lab 12/24/21 1855 12/26/21 0514 12/26/21 1350 12/26/21 1352  NA 138 139 141 140  K 3.8 4.0 4.1 4.1  CL 100 104  --   --   CO2 30 26  --   --   GLUCOSE 209* 216*  --   --   BUN 24* 30*  --   --   CREATININE 1.39* 1.45*  --   --   CALCIUM 9.3 9.3  --   --   MG  --  2.7*  --   --   PROT 7.4  --   --   --   ALBUMIN 4.0  --   --   --   AST 17  --   --   --   ALT 17  --   --   --   ALKPHOS 141*  --   --   --   BILITOT 0.6  --   --   --   GFRNONAA 39* 37*  --   --   ANIONGAP 8 9  --   --     Lipids No results for input(s): "CHOL", "TRIG", "HDL", "LABVLDL", "LDLCALC", "CHOLHDL" in the last 168 hours.  Hematology Recent Labs  Lab 12/24/21 1855 12/26/21 0514 12/26/21 1350 12/26/21 1352  WBC 7.3 12.6*  --   --   RBC 4.40 3.98  --   --   HGB 11.6* 10.5* 11.2* 11.2*  HCT 37.0 32.9* 33.0* 33.0*  MCV 84.1 82.7  --   --   MCH 26.4 26.4  --   --   MCHC 31.4 31.9  --   --   RDW 15.6* 15.8*  --   --   PLT 249 256  --   --    Thyroid No results for input(s): "TSH", "FREET4" in the last 168 hours.  BNP Recent Labs  Lab 12/24/21 1855  BNP 21.3    DDimer  Recent Labs  Lab 12/24/21 2249  DDIMER 0.44     Radiology    CARDIAC CATHETERIZATION  Result Date: 12/26/2021 Conclusions: Stable appearance of coronary arteries with severe stenosis involving proximal portion of  small D1 branch and otherwise mild-moderate, non-obstructive disease. Mildly elevated left heart filling pressures. Moderately-severely elevated right heart filling pressures. Normal Fick cardiac output/index. Recommendations: Continue medical therapy of CAD.  D1 is not well-suited for PCI. Consider gentle diuresis and addition of SGLT-2 inhibitor if renal function remains stable/improved. Nelva Bush, MD [facility]  ECHOCARDIOGRAM COMPLETE  Result Date: 12/26/2021    ECHOCARDIOGRAM REPORT   Patient Name:   [name] Date of Exam: 12/26/2021 Medical Rec #:  [id number]      Height:       61.0 in Accession #:    [id number]     Weight:       214.0 lb Date of Birth:  [birth year]      BSA:          1.944 m Patient Age:    76 years       BP:           132/56 mmHg Patient Gender: F              HR:           71 bpm. Exam Location:  [facility] Procedure: 2D Echo, Cardiac Doppler, Color Doppler and Intracardiac            Opacification Agent Indications:     Dyspnea R06.00  History:         Patient has prior history of Echocardiogram examinations, most                  recent 10/09/2020. CHF, CAD; Risk Factors:Diabetes.  Sonographer:     Sherrie Sport Referring Phys:  [id number] SHERI HAMMOCK Diagnosing Phys: Nelva Bush MD IMPRESSIONS  1. Left ventricular ejection fraction, by estimation, is 55 to 60%. The left ventricle has normal function. The left ventricle has no regional wall motion abnormalities. There is mild  left ventricular hypertrophy. Left ventricular diastolic parameters are consistent with Grade II diastolic dysfunction (pseudonormalization). Elevated left atrial pressure.  2. Right ventricular systolic function is normal. The right ventricular size is normal.  3. The mitral valve is degenerative. Mild mitral valve regurgitation. No evidence of mitral stenosis.  4. The aortic valve is tricuspid. There is mild calcification of the aortic valve. There is mild thickening of the aortic valve. Aortic valve  regurgitation is not visualized. Aortic valve sclerosis/calcification is present, without any evidence of aortic stenosis. FINDINGS  Left Ventricle: Left ventricular ejection fraction, by estimation, is 55 to 60%. The left ventricle has normal function. The left ventricle has no regional wall motion abnormalities. Definity contrast agent was given IV to delineate the left ventricular  endocardial borders. The left ventricular internal cavity size was normal in size. There is mild left ventricular hypertrophy. Left ventricular diastolic parameters are consistent with Grade II diastolic dysfunction (pseudonormalization). Elevated left atrial pressure. Right Ventricle: The right ventricular size is normal. No increase in right ventricular wall thickness. Right ventricular systolic function is normal. Left Atrium: Left atrial size was normal in size. Right Atrium: Right atrial size was normal in size. Pericardium: The pericardium was not well visualized. Mitral Valve: The mitral valve is degenerative in appearance. There is mild thickening of the mitral valve leaflet(s). Mild to moderate mitral annular calcification. Mild mitral valve regurgitation. No evidence of mitral valve stenosis. Tricuspid Valve: The tricuspid valve is not well visualized. Tricuspid valve regurgitation is trivial. Aortic Valve: The aortic valve is tricuspid. There is mild calcification of the aortic valve. There is mild thickening of the aortic valve. Aortic valve regurgitation is not visualized. Aortic valve sclerosis/calcification is present, without any evidence of aortic stenosis. Aortic valve mean gradient measures 4.0 mmHg. Aortic valve peak gradient measures 8.2 mmHg. Aortic valve area, by VTI measures 2.38 cm. Pulmonic Valve: The pulmonic valve was not well visualized. Pulmonic valve regurgitation is not visualized. No evidence of pulmonic stenosis. Aorta: The aortic root is normal in size and structure. Pulmonary Artery: The pulmonary  artery is not well seen. Venous: The inferior vena cava was not well visualized. IAS/Shunts: The interatrial septum was not well visualized. Additional Comments: A device lead is visualized.  LEFT VENTRICLE PLAX 2D LVIDd:         4.20 cm   Diastology LVIDs:         3.00 cm   LV e' medial:    7.51 cm/s LV PW:         1.10 cm   LV E/e' medial:  16.1 LV IVS:        1.00 cm   LV e' lateral:   9.90 cm/s LVOT diam:     2.00 cm   LV E/e' lateral: 12.2 LV SV:         66 LV SV Index:   34 LVOT Area:     3.14 cm  RIGHT VENTRICLE RV Basal diam:  2.40 cm RV Mid diam:    2.50 cm RV S prime:     12.90 cm/s TAPSE (M-mode): 2.6 cm LEFT ATRIUM             Index        RIGHT ATRIUM           Index LA diam:        3.70 cm 1.90 cm/m   RA Area:     16.10 cm LA Vol (A2C):   73.4 ml 37.76 ml/m  RA Volume:   41.10 ml  21.14 ml/m LA Vol (A4C):   27.5 ml 14.15 ml/m LA Biplane Vol: 47.3 ml 24.33 ml/m  AORTIC VALVE AV Area (Vmax):    2.10 cm AV Area (Vmean):   2.10 cm AV Area (VTI):     2.38 cm AV Vmax:           143.00 cm/s AV Vmean:          97.900 cm/s AV VTI:            0.276 m AV Peak Grad:      8.2 mmHg AV Mean Grad:      4.0 mmHg LVOT Vmax:         95.40 cm/s LVOT Vmean:        65.500 cm/s LVOT VTI:          0.209 m LVOT/AV VTI ratio: 0.76  AORTA Ao Root diam: 2.70 cm MITRAL VALVE                TRICUSPID VALVE MV Area (PHT): 3.93 cm     TR Peak grad:   9.1 mmHg MV Decel Time: 193 msec     TR Vmax:        151.00 cm/s MV E velocity: 121.00 cm/s MV A velocity: 116.00 cm/s  SHUNTS MV E/A ratio:  1.04         Systemic VTI:  0.21 m                             Systemic Diam: 2.00 cm Nelva Bush MD Electronically signed by Nelva Bush MD Signature Date/Time: 12/26/2021/6:34:49 PM    Final     Cardiac Studies   See catheterization and echocardiogram above.  Patient Profile     76 y.o. female woman with history of CAD, chronic HFpEF, symptomatic bradycardia status post dual-chamber pacemaker, type 2 diabetes mellitus,  and CKD stage III, admitted with progressive shortness of breath and chest tightness.  Assessment & Plan    Accelerating angina: Symptoms worrisome for progressive CAD though MPI last summer and more recent coronary CTA showed nonobstructive CAD without significant ischemia.  Catheterization today demonstrated stable appearance of the coronary arteries.  Only significant disease involves a small diagonal branch that is not well suited to PCI. -No obvious culprit for PCI.  Will need to continue with medical therapy. -Continue ranolazine 1000 mg twice daily. -DC diltiazem given potential interaction with ranolazine and restart atenolol 25 mg daily. -Consider restarting isosorbide mononitrate prior to discharge. -Continue aggressive secondary prevention to prevent progression of CAD.  Acute on chronic HFpEF: Heart catheterization today showed mildly-moderately elevated left heart filling pressures severely elevated right heart filling pressures.  Notably, PA pressures were only mildly-moderately elevated with normal Fick cardiac output/index. -Restart diuresis +/- empagliflozin tomorrow. -Consider aldosterone antagonist prior to discharge if renal function/potassium allow. -If symptoms persist, consider outpatient cardiopulmonary stress test to better understand patient's functional capacity and role of deconditioning and her symptomatology.  Chronic kidney disease: -Recheck BMP in AM. -Maintain net even fluid balance today; consider diuresis beginning tomorrow based on renal function.  For questions or updates, please contact [place] Please consult [email or web address] for contact info under [facility].    Signed, Nelva Bush, MD  12/26/2021, 9:15 PM

## 2021-12-26 NOTE — Progress Notes (Signed)
OT Cancellation Note  Patient Details Name: [name] MRN: [id number] DOB: [birth year]   Cancelled Treatment:    Reason Eval/Treat Not Completed: Patient at procedure or test/ unavailable. Pt out of the room. Per chart, plan for R/L heart catheterization today. Will re-attempt OT tx at later date/time as pt is available and appropriate.   Ardeth Perfect., MPH, MS, OTR/L ascom [phone] 12/26/21, 11:25 AM

## 2021-12-26 NOTE — Progress Notes (Signed)
PROGRESS NOTE    [name]  JOI:[id number] DOB: 06-Dec-1945 DOA: 12/24/2021 PCP: Rusty Aus, MD   Brief Narrative:  76 year old with history of obesity, diastolic CHF, CKD stage IIIa, OSA on CPAP, insulin-dependent DM2, HTN, CAD with nonobstructive disease, pacemaker 2021 admitted for shortness of breath and chest tightness.  Patient follows with outpatient cardiology and pulmonary for history of CHF and moderate restrictive lung disease.  Upon admission chest x-ray was overall negative, BNP normal, EKG unremarkable.  Patient was started on nebulizers and Solu-Medrol with symptomatic improvement.  Due to concerns of unstable angina and possible pulmonary hypertension, cardiology team was consulted for possible LHC/RHC.  Echocardiogram performed, results pending.   Assessment & Plan:  Principal Problem:   Acute on chronic dyspnea Active Problems:   Coronary artery disease   Chronic diastolic heart failure (HCC)   Restrictive lung disease on spirometry 04/2021   Acute renal failure superimposed on stage 3a chronic kidney disease (HCC)   Physical deconditioning   Morbid obesity ([place])   Uncontrolled type 2 diabetes mellitus with hyperglycemia, with long-term current use of insulin (HCC)   OSA on CPAP   HTN (hypertension)   Hypothyroidism   Status post placement of cardiac pacemaker     Assessment and Plan: * Acute on chronic dyspnea Left-sided jaw pain, concerning for unstable angina Patient's symptoms are multifactorial in nature with combination of OSA/OHS, possible restrictive lung disease, CHF, deconditioning.  Each issues discussed below. BNP is normal, troponins remain flat.  D-dimer is negative. I-S, antitussives, supplemental oxygen  Coronary artery disease Due to concerns of unstable angina despite of flat troponin and nonischemic EKG.  Patient has failed medical management alone therefore cardiology team consulted.  Previously patient has had coronary CTA in June  2023 showing nonobstructive disease.  Echocardiogram performed, results pending.  Plans for LHC/RHC today.  She has not tolerated Imdur in the past  Restrictive lung disease on spirometry 04/2021 with possible acute flare Seen by outpatient pulmonary Dr. Raul Del.  For now continue bronchodilators, prednisone, I-S/flutter valve May benefit from outpatient high-resolution CT scan  Leukocytosis - Secondary to steroids  Chronic diastolic heart failure (HCC) Echocardiogram in July 2023 showed EF 65%, grade 2 DD.  Overall appears to be euvolemic in nature therefore on p.o. torsemide.  Repeat echo  Physical deconditioning PT/OT  stage 3a chronic kidney disease ([place]) Patient does not have AKI.  Baseline creatinine is 1.3.  Uncontrolled type 2 diabetes mellitus with hyperglycemia, with long-term current use of insulin ([place]) Continue home medication along with sliding scale and Accu-Cheks.  Increase Semglee to 25 units daily  Morbid obesity ([place]) Complicating factor to overall prognosis and care  Status post placement of cardiac pacemaker No acute issues suspected Followed by Dr. Caryl Comes  Hypothyroidism Continue levothyroxine  HTN (hypertension) Continue atenolol.  OSA on CPAP CPAP nightly   DVT prophylaxis: Lovenox Code Status: Full code Family Communication:   Maintain hospital stay for evaluation of shortness of breath, left-sided jaw pain Plans for LHC    Subjective: Seen and examined at bedside, denies any chest pain and shortness of breath this morning.   Examination: Constitutional: Not in acute distress Respiratory: Clear to auscultation bilaterally Cardiovascular: Normal sinus rhythm, no rubs Abdomen: Nontender nondistended good bowel sounds Musculoskeletal: No edema noted Skin: No rashes seen Neurologic: CN 2-12 grossly intact.  And nonfocal Psychiatric: Normal judgment and insight. Alert and oriented x 3. Normal mood.  Objective: Vitals:   12/25/21 4709  12/26/21 0446 12/26/21 0732  12/26/21 0808  BP: (!) 111/53 (!) 120/57 (!) 132/56   Pulse: 91 81 71   Resp: '18 20 19   '$ Temp: 98.6 F (37 C) 98 F (36.7 C) 97.9 F (36.6 C)   TempSrc: Oral     SpO2: 95% 93% 95% 98%  Weight:      Height:        Intake/Output Summary (Last 24 hours) at 12/26/2021 0823 Last data filed at 12/25/2021 1415 Gross per 24 hour  Intake 3 ml  Output --  Net 3 ml   Filed Weights   12/24/21 1857  Weight: 97.1 kg     Data Reviewed:   CBC: Recent Labs  Lab 12/24/21 1855 12/26/21 0514  WBC 7.3 12.6*  HGB 11.6* 10.5*  HCT 37.0 32.9*  MCV 84.1 82.7  PLT 249 500   Basic Metabolic Panel: Recent Labs  Lab 12/24/21 1855 12/26/21 0514  NA 138 139  K 3.8 4.0  CL 100 104  CO2 30 26  GLUCOSE 209* 216*  BUN 24* 30*  CREATININE 1.39* 1.45*  CALCIUM 9.3 9.3  MG  --  2.7*   GFR: Estimated Creatinine Clearance: 35.2 mL/min (A) (by C-G formula based on SCr of 1.45 mg/dL (H)). Liver Function Tests: Recent Labs  Lab 12/24/21 1855  AST 17  ALT 17  ALKPHOS 141*  BILITOT 0.6  PROT 7.4  ALBUMIN 4.0   No results for input(s): "LIPASE", "AMYLASE" in the last 168 hours. No results for input(s): "AMMONIA" in the last 168 hours. Coagulation Profile: No results for input(s): "INR", "PROTIME" in the last 168 hours. Cardiac Enzymes: No results for input(s): "CKTOTAL", "CKMB", "CKMBINDEX", "TROPONINI" in the last 168 hours. BNP (last 3 results) No results for input(s): "PROBNP" in the last 8760 hours. HbA1C: Recent Labs    12/24/21 2249  HGBA1C 6.2*   CBG: Recent Labs  Lab 12/25/21 1020 12/25/21 1304 12/25/21 1606 12/25/21 2016 12/26/21 0731  GLUCAP 287* 232* 221* 262* 203*   Lipid Profile: No results for input(s): "CHOL", "HDL", "LDLCALC", "TRIG", "CHOLHDL", "LDLDIRECT" in the last 72 hours. Thyroid Function Tests: No results for input(s): "TSH", "T4TOTAL", "FREET4", "T3FREE", "THYROIDAB" in the last 72 hours. Anemia Panel: No results  for input(s): "VITAMINB12", "FOLATE", "FERRITIN", "TIBC", "IRON", "RETICCTPCT" in the last 72 hours. Sepsis Labs: No results for input(s): "PROCALCITON", "LATICACIDVEN" in the last 168 hours.  No results found for this or any previous visit (from the past 240 hour(s)).       Radiology Studies: DG Chest 2 View  Result Date: 12/24/2021 CLINICAL DATA:  Shortness of breath. Increased work of breathing and weakness. EXAM: CHEST - 2 VIEW COMPARISON:  Chest radiograph dated August 04, 2021 FINDINGS: The heart size and mediastinal contours are within normal limits. Mild aortic atherosclerotic calcifications. Left access pacemaker with leads in the right atrium and right ventricle. Partially imaged lumbar spine hardware. Elevation of the right hemidiaphragm, unchanged. IMPRESSION: 1. No active cardiopulmonary disease. 2. Elevation of the right hemidiaphragm, unchanged. Electronically Signed   By: Keane Police D.O.   On: 12/24/2021 20:21        Scheduled Meds:  aspirin  81 mg Oral QHS   aspirin  81 mg Oral Pre-Cath   budesonide (PULMICORT) nebulizer solution  0.5 mg Nebulization BID   cholecalciferol  1,000 Units Oral Daily   diltiazem  120 mg Oral BID   enoxaparin (LOVENOX) injection  0.5 mg/kg Subcutaneous Q24H   gabapentin  300 mg Oral TID   insulin  aspart  0-20 Units Subcutaneous TID WC   insulin aspart  0-5 Units Subcutaneous QHS   insulin glargine-yfgn  20 Units Subcutaneous Daily   levothyroxine  75 mcg Oral Q0600   pantoprazole  40 mg Oral Daily   potassium chloride SA  20 mEq Oral Daily   predniSONE  40 mg Oral Q breakfast   ranolazine  1,000 mg Oral BID   ropinirole  5 mg Oral QHS   sodium chloride flush  3 mL Intravenous Q12H   temazepam  30 mg Oral QHS   torsemide  20 mg Oral Daily   Continuous Infusions:   LOS: 0 days   Time spent= 35 mins    Iman Reinertsen Arsenio Loader, MD [facility]  If 7PM-7AM, please contact night-coverage  12/26/2021, 8:23 AM

## 2021-12-26 NOTE — Interval H&P Note (Signed)
History and Physical Interval Note:  12/26/2021 1:28 PM  [name]  has presented today for surgery, with the diagnosis of chest pain with progressive shortness of breath and accelerating angina.  The various methods of treatment have been discussed with the patient and family. After consideration of risks, benefits and other options for treatment, the patient has consented to  Procedure(s): RIGHT/LEFT HEART CATH AND CORONARY ANGIOGRAPHY (N/A) as a surgical intervention.  The patient's history has been reviewed, patient examined, no change in status, stable for surgery.  I have reviewed the patient's chart and labs.  Questions were answered to the patient's satisfaction.    Cath Lab Visit (complete for each Cath Lab visit)  Clinical Evaluation Leading to the Procedure:   ACS: No.  Non-ACS:    Anginal Classification: CCS IV  Anti-ischemic medical therapy: Maximal Therapy (2 or more classes of medications)  Non-Invasive Test Results: Low-risk stress test findings: cardiac mortality <1%/year  Prior CABG: No previous CABG  [name]

## 2021-12-26 NOTE — Brief Op Note (Signed)
BRIEF CARDIAC CATHETERIZATION NOTE  12/26/2021  2:11 PM  PATIENT:  [name]  76 y.o. female  PRE-OPERATIVE DIAGNOSIS:  chest pain with progressive shortness of breath  POST-OPERATIVE DIAGNOSIS:  * No post-op diagnosis entered *  PROCEDURE:  Procedure(s): RIGHT/LEFT HEART CATH AND CORONARY ANGIOGRAPHY (N/A)  SURGEON:  Surgeon(s) and Role:    * Fayette Hamada, MD - Primary  FINDINGS: Stable appearance of coronary arteries with severe stenosis involving proximal portion of small D1 branch and otherwise mild-moderate, non-obstructive disease. Mildly elevated left heart filling pressures. Moderately-severely elevated right heart filling pressures. Normal Fick cardiac output/index.  RECOMMENDATIONS: Continue medical therapy of CAD.  D1 is not well-suited for PCI. Consider gentle diuresis and addition of SGLT-2 inhibitor if renal function remains stable/improved.  Nelva Bush, MD [facility]

## 2021-12-27 ENCOUNTER — Encounter: Payer: Self-pay | Admitting: Internal Medicine

## 2021-12-27 DIAGNOSIS — R0600 Dyspnea, unspecified: Secondary | ICD-10-CM | POA: Diagnosis not present

## 2021-12-27 DIAGNOSIS — I5032 Chronic diastolic (congestive) heart failure: Secondary | ICD-10-CM

## 2021-12-27 LAB — URINALYSIS, COMPLETE (UACMP) WITH MICROSCOPIC
Bilirubin Urine: NEGATIVE
Glucose, UA: >=500 mg/dL — AB
Hgb urine dipstick: NEGATIVE
Ketones, ur: NEGATIVE mg/dL
Leukocytes,Ua: NEGATIVE
Nitrite: NEGATIVE
Protein, ur: NEGATIVE mg/dL
Specific Gravity, Urine: 1.024 (ref 1.005–1.030)
pH: 5 (ref 5.0–8.0)

## 2021-12-27 LAB — CBC
HCT: 32.4 % — ABNORMAL LOW (ref 36.0–46.0)
Hemoglobin: 10.2 g/dL — ABNORMAL LOW (ref 12.0–15.0)
MCH: 26.2 pg (ref 26.0–34.0)
MCHC: 31.5 g/dL (ref 30.0–36.0)
MCV: 83.1 fL (ref 80.0–100.0)
Platelets: 241 10*3/uL (ref 150–400)
RBC: 3.9 MIL/uL (ref 3.87–5.11)
RDW: 16 % — ABNORMAL HIGH (ref 11.5–15.5)
WBC: 12.6 10*3/uL — ABNORMAL HIGH (ref 4.0–10.5)
nRBC: 0 % (ref 0.0–0.2)

## 2021-12-27 LAB — BASIC METABOLIC PANEL
Anion gap: 8 (ref 5–15)
BUN: 41 mg/dL — ABNORMAL HIGH (ref 8–23)
CO2: 27 mmol/L (ref 22–32)
Calcium: 9 mg/dL (ref 8.9–10.3)
Chloride: 106 mmol/L (ref 98–111)
Creatinine, Ser: 1.76 mg/dL — ABNORMAL HIGH (ref 0.44–1.00)
GFR, Estimated: 30 mL/min — ABNORMAL LOW (ref >60)
Glucose, Bld: 163 mg/dL — ABNORMAL HIGH (ref 70–99)
Potassium: 4.1 mmol/L (ref 3.5–5.1)
Sodium: 141 mmol/L (ref 135–145)

## 2021-12-27 LAB — MAGNESIUM: Magnesium: 2.5 mg/dL — ABNORMAL HIGH (ref 1.7–2.4)

## 2021-12-27 LAB — GLUCOSE, CAPILLARY
Glucose-Capillary: 149 mg/dL — ABNORMAL HIGH (ref 70–99)
Glucose-Capillary: 180 mg/dL — ABNORMAL HIGH (ref 70–99)

## 2021-12-27 MED ORDER — ENOXAPARIN SODIUM 40 MG/0.4ML IJ SOSY: 40.0000 mg | PREFILLED_SYRINGE | INTRAMUSCULAR | Status: DC

## 2021-12-27 MED ORDER — ATENOLOL 25 MG PO TABS
25.0000 mg | ORAL_TABLET | Freq: Every day | ORAL | Status: DC
  Administered 2021-12-27: 25 mg via ORAL
  Filled 2021-12-27: qty 1

## 2021-12-27 MED ORDER — ATENOLOL 25 MG PO TABS: 25.0000 mg | ORAL_TABLET | ORAL | 3 refills | Status: DC

## 2021-12-27 NOTE — TOC Transition Note (Signed)
Transition of Care [facility]) - CM/SW Discharge Note   Patient Details  Name: [name] MRN: [id number] Date of Birth: [birth year]  [facility]) CM/SW Contact:  Candie Chroman, LCSW Phone Number: 12/27/2021, 11:13 AM   Clinical Narrative:  Patient has orders to discharge home today. [place] liaison is aware. No further concerns. CSW signing off.  Final next level of care: Home w Home Health Services Barriers to Discharge: Barriers Resolved   Patient Goals and CMS Choice   CMS Medicare.gov Compare Post Acute Care list provided to:: Patient Choice offered to / list presented to : Patient  Discharge Placement                    Patient and family notified of of transfer: 12/27/21  Discharge Plan and Services     Post Acute Care Choice: Home Health                    HH Arranged: RN, PT, OT [facility] Agency: [place] ([place]) Date [place]: 12/27/21   Representative spoke with at [place]: Floydene Flock  Social Determinants of Health (SDOH) Interventions     Readmission Risk Interventions     No data to display

## 2021-12-27 NOTE — Progress Notes (Signed)
PHARMACIST - PHYSICIAN COMMUNICATION  CONCERNING:  Enoxaparin (Lovenox) for DVT Prophylaxis    RECOMMENDATION: Patient was prescribed enoxaprin '40mg'$  q24 hours for VTE prophylaxis.   Filed Weights   12/24/21 1857  Weight: 97.1 kg (214 lb)    Body mass index is 40.43 kg/m.  Estimated Creatinine Clearance: 29 mL/min (A) (by C-G formula based on SCr of 1.76 mg/dL (H)).   Based on [place] patient is candidate for enoxaparin 0.'5mg'$ /kg TBW SQ every 24 hours based on BMI being >30. Patient is also candidate for enoxaparin '30mg'$  every 24 hours based on CrCl <45m/min. Will dose reduce from 47.5 mg (~0.5 mg/kg) every 24 hours to 40 mg every 24 hours given BMI >30 and CrCl <30 ml/min.  DESCRIPTION: [facility]  Patient is now receiving enoxaparin 40 mg every 24 hours   CGretel Acre PharmD PGY1 Pharmacy Resident 12/27/2021 7:18 AM

## 2021-12-27 NOTE — Progress Notes (Signed)
Rounding Note    Patient Name: [name] Date of Encounter: 12/27/2021  [place] Cardiologist: Ida Rogue, MD   Subjective   No acute events overnight, left heart cath performed yesterday, first diagonal disease not amenable to PCI.  Patient did not tolerate Imdur in the past.  Denies chest pain,.  Inpatient Medications    Scheduled Meds:  aspirin  81 mg Oral QHS   atenolol  25 mg Oral Daily   budesonide (PULMICORT) nebulizer solution  0.5 mg Nebulization BID   cholecalciferol  1,000 Units Oral Daily   enoxaparin (LOVENOX) injection  40 mg Subcutaneous Q24H   gabapentin  300 mg Oral TID   insulin aspart  0-20 Units Subcutaneous TID WC   insulin aspart  0-5 Units Subcutaneous QHS   insulin glargine-yfgn  25 Units Subcutaneous Daily   levothyroxine  75 mcg Oral Q0600   pantoprazole  40 mg Oral Daily   potassium chloride SA  20 mEq Oral Daily   predniSONE  40 mg Oral Q breakfast   ranolazine  1,000 mg Oral BID   ropinirole  5 mg Oral QHS   sodium chloride flush  3 mL Intravenous Q12H   sodium chloride flush  3 mL Intravenous Q12H   temazepam  30 mg Oral QHS   Continuous Infusions:  sodium chloride     PRN Meds: sodium chloride, acetaminophen, azelastine, guaiFENesin, hydrALAZINE, ipratropium-albuterol, methocarbamol, metoprolol tartrate, nitroGLYCERIN, ondansetron (ZOFRAN) IV, senna-docusate, sodium chloride flush, traMADol, traZODone   Vital Signs    Vitals:   12/27/21 0019 12/27/21 0408 12/27/21 0748 12/27/21 0753  BP: (!) 147/57 (!) 124/48 (!) 135/54   Pulse: 72 75 74   Resp: '16 16 16   '$ Temp: 98.2 F (36.8 C) 98.2 F (36.8 C) 97.8 F (36.6 C)   TempSrc: Oral     SpO2: 98% 96% 97% 96%  Weight:      Height:        Intake/Output Summary (Last 24 hours) at 12/27/2021 1109 Last data filed at 12/27/2021 0900 Gross per 24 hour  Intake 726 ml  Output --  Net 726 ml      12/24/2021    6:57 PM 09/25/2021   11:21 AM 08/18/2021    7:51  AM  Last 3 Weights  Weight (lbs) 214 lb 213 lb 6 oz 207 lb 6 oz  Weight (kg) 97.07 kg 96.786 kg 94.065 kg      Telemetry    Sinus rhythm, heart rate 87- Personally Reviewed  ECG     - Personally Reviewed  Physical Exam   GEN: No acute distress.   Neck: No JVD Cardiac: RRR, no murmurs, rubs, or gallops.  Respiratory: Diminished breath sounds bilaterally, GI: Soft, nontender, distended  MS: No edema; No deformity. Neuro:  Nonfocal  Psych: Normal affect   Labs    High Sensitivity Troponin:   Recent Labs  Lab 12/24/21 1855 12/24/21 2249  TROPONINIHS 4 3     Chemistry Recent Labs  Lab 12/24/21 1855 12/26/21 0514 12/26/21 1350 12/26/21 1352 12/27/21 0323  NA 138 139 141 140 141  K 3.8 4.0 4.1 4.1 4.1  CL 100 104  --   --  106  CO2 30 26  --   --  27  GLUCOSE 209* 216*  --   --  163*  BUN 24* 30*  --   --  41*  CREATININE 1.39* 1.45*  --   --  1.76*  CALCIUM 9.3 9.3  --   --  9.0  MG  --  2.7*  --   --  2.5*  PROT 7.4  --   --   --   --   ALBUMIN 4.0  --   --   --   --   AST 17  --   --   --   --   ALT 17  --   --   --   --   ALKPHOS 141*  --   --   --   --   BILITOT 0.6  --   --   --   --   GFRNONAA 39* 37*  --   --  30*  ANIONGAP 8 9  --   --  8    Lipids No results for input(s): "CHOL", "TRIG", "HDL", "LABVLDL", "LDLCALC", "CHOLHDL" in the last 168 hours.  Hematology Recent Labs  Lab 12/24/21 1855 12/26/21 0514 12/26/21 1350 12/26/21 1352 12/27/21 0323  WBC 7.3 12.6*  --   --  12.6*  RBC 4.40 3.98  --   --  3.90  HGB 11.6* 10.5* 11.2* 11.2* 10.2*  HCT 37.0 32.9* 33.0* 33.0* 32.4*  MCV 84.1 82.7  --   --  83.1  MCH 26.4 26.4  --   --  26.2  MCHC 31.4 31.9  --   --  31.5  RDW 15.6* 15.8*  --   --  16.0*  PLT 249 256  --   --  241   Thyroid No results for input(s): "TSH", "FREET4" in the last 168 hours.  BNP Recent Labs  Lab 12/24/21 1855  BNP 21.3    DDimer  Recent Labs  Lab 12/24/21 2249  DDIMER 0.44     Radiology    CARDIAC  CATHETERIZATION  Result Date: 12/26/2021 Conclusions: Stable appearance of coronary arteries with severe stenosis involving proximal portion of small D1 branch and otherwise mild-moderate, non-obstructive disease. Mildly elevated left heart filling pressures. Moderately-severely elevated right heart filling pressures. Normal Fick cardiac output/index. Recommendations: Continue medical therapy of CAD.  D1 is not well-suited for PCI. Consider gentle diuresis and addition of SGLT-2 inhibitor if renal function remains stable/improved. Nelva Bush, MD [facility]  ECHOCARDIOGRAM COMPLETE  Result Date: 12/26/2021    ECHOCARDIOGRAM REPORT   Patient Name:   [name] Date of Exam: 12/26/2021 Medical Rec #:  [id number]      Height:       61.0 in Accession #:    [id number]     Weight:       214.0 lb Date of Birth:  1945-09-06      BSA:          1.944 m Patient Age:    76 years       BP:           132/56 mmHg Patient Gender: F              HR:           71 bpm. Exam Location:  [facility] Procedure: 2D Echo, Cardiac Doppler, Color Doppler and Intracardiac            Opacification Agent Indications:     Dyspnea R06.00  History:         Patient has prior history of Echocardiogram examinations, most                  recent 10/09/2020. CHF, CAD; Risk Factors:Diabetes.  Sonographer:     Sherrie Sport Referring Phys:  [phone]  SHERI HAMMOCK Diagnosing Phys: Nelva Bush MD IMPRESSIONS  1. Left ventricular ejection fraction, by estimation, is 55 to 60%. The left ventricle has normal function. The left ventricle has no regional wall motion abnormalities. There is mild left ventricular hypertrophy. Left ventricular diastolic parameters are consistent with Grade II diastolic dysfunction (pseudonormalization). Elevated left atrial pressure.  2. Right ventricular systolic function is normal. The right ventricular size is normal.  3. The mitral valve is degenerative. Mild mitral valve regurgitation. No evidence of mitral  stenosis.  4. The aortic valve is tricuspid. There is mild calcification of the aortic valve. There is mild thickening of the aortic valve. Aortic valve regurgitation is not visualized. Aortic valve sclerosis/calcification is present, without any evidence of aortic stenosis. FINDINGS  Left Ventricle: Left ventricular ejection fraction, by estimation, is 55 to 60%. The left ventricle has normal function. The left ventricle has no regional wall motion abnormalities. Definity contrast agent was given IV to delineate the left ventricular  endocardial borders. The left ventricular internal cavity size was normal in size. There is mild left ventricular hypertrophy. Left ventricular diastolic parameters are consistent with Grade II diastolic dysfunction (pseudonormalization). Elevated left atrial pressure. Right Ventricle: The right ventricular size is normal. No increase in right ventricular wall thickness. Right ventricular systolic function is normal. Left Atrium: Left atrial size was normal in size. Right Atrium: Right atrial size was normal in size. Pericardium: The pericardium was not well visualized. Mitral Valve: The mitral valve is degenerative in appearance. There is mild thickening of the mitral valve leaflet(s). Mild to moderate mitral annular calcification. Mild mitral valve regurgitation. No evidence of mitral valve stenosis. Tricuspid Valve: The tricuspid valve is not well visualized. Tricuspid valve regurgitation is trivial. Aortic Valve: The aortic valve is tricuspid. There is mild calcification of the aortic valve. There is mild thickening of the aortic valve. Aortic valve regurgitation is not visualized. Aortic valve sclerosis/calcification is present, without any evidence of aortic stenosis. Aortic valve mean gradient measures 4.0 mmHg. Aortic valve peak gradient measures 8.2 mmHg. Aortic valve area, by VTI measures 2.38 cm. Pulmonic Valve: The pulmonic valve was not well visualized. Pulmonic valve  regurgitation is not visualized. No evidence of pulmonic stenosis. Aorta: The aortic root is normal in size and structure. Pulmonary Artery: The pulmonary artery is not well seen. Venous: The inferior vena cava was not well visualized. IAS/Shunts: The interatrial septum was not well visualized. Additional Comments: A device lead is visualized.  LEFT VENTRICLE PLAX 2D LVIDd:         4.20 cm   Diastology LVIDs:         3.00 cm   LV e' medial:    7.51 cm/s LV PW:         1.10 cm   LV E/e' medial:  16.1 LV IVS:        1.00 cm   LV e' lateral:   9.90 cm/s LVOT diam:     2.00 cm   LV E/e' lateral: 12.2 LV SV:         66 LV SV Index:   34 LVOT Area:     3.14 cm  RIGHT VENTRICLE RV Basal diam:  2.40 cm RV Mid diam:    2.50 cm RV S prime:     12.90 cm/s TAPSE (M-mode): 2.6 cm LEFT ATRIUM             Index        RIGHT ATRIUM  Index LA diam:        3.70 cm 1.90 cm/m   RA Area:     16.10 cm LA Vol (A2C):   73.4 ml 37.76 ml/m  RA Volume:   41.10 ml  21.14 ml/m LA Vol (A4C):   27.5 ml 14.15 ml/m LA Biplane Vol: 47.3 ml 24.33 ml/m  AORTIC VALVE AV Area (Vmax):    2.10 cm AV Area (Vmean):   2.10 cm AV Area (VTI):     2.38 cm AV Vmax:           143.00 cm/s AV Vmean:          97.900 cm/s AV VTI:            0.276 m AV Peak Grad:      8.2 mmHg AV Mean Grad:      4.0 mmHg LVOT Vmax:         95.40 cm/s LVOT Vmean:        65.500 cm/s LVOT VTI:          0.209 m LVOT/AV VTI ratio: 0.76  AORTA Ao Root diam: 2.70 cm MITRAL VALVE                TRICUSPID VALVE MV Area (PHT): 3.93 cm     TR Peak grad:   9.1 mmHg MV Decel Time: 193 msec     TR Vmax:        151.00 cm/s MV E velocity: 121.00 cm/s MV A velocity: 116.00 cm/s  SHUNTS MV E/A ratio:  1.04         Systemic VTI:  0.21 m                             Systemic Diam: 2.00 cm Nelva Bush MD Electronically signed by Nelva Bush MD Signature Date/Time: 12/26/2021/6:34:49 PM    Final     Cardiac Studies   LHC 12/26/2021 Conclusions: Stable appearance of coronary  arteries with severe stenosis involving proximal portion of small D1 branch and otherwise mild-moderate, non-obstructive disease. Mildly elevated left heart filling pressures. Moderately-severely elevated right heart filling pressures. Normal Fick cardiac output/index.   Recommendations: Continue medical therapy of CAD.  D1 is not well-suited for PCI. Consider gentle diuresis and addition of SGLT-2 inhibitor if renal function remains stable/improved.  TTE 12/26/2021 1. Left ventricular ejection fraction, by estimation, is 55 to 60%. The  left ventricle has normal function. The left ventricle has no regional  wall motion abnormalities. There is mild left ventricular hypertrophy.  Left ventricular diastolic parameters  are consistent with Grade II diastolic dysfunction (pseudonormalization).  Elevated left atrial pressure.   2. Right ventricular systolic function is normal. The right ventricular  size is normal.   3. The mitral valve is degenerative. Mild mitral valve regurgitation. No  evidence of mitral stenosis.   4. The aortic valve is tricuspid. There is mild calcification of the  aortic valve. There is mild thickening of the aortic valve. Aortic valve  regurgitation is not visualized. Aortic valve sclerosis/calcification is  present, without any evidence of  aortic stenosis.   Patient Profile     76 y.o. female with history of HFpEF, CAD presenting with chest pain and shortness of breath.  Underwent left heart cath showing mild to moderate LAD disease, 80% first diagonal not amenable to PCI.  Assessment & Plan    CAD, 90% diagonal, 50% LAD. -Medical management recommended -Aspirin, atenolol 25  mg daily, Ranexa -Consider PCSK9 as outpatient -Echo with preserved EF  2.  HFpEF -Restart PTA torsemide 40 mg daily  Okay for discharge from a cardiac perspective.  Close follow-up as outpatient with primary cardiologist advised.  Low-salt diet, low calorie diet, weight loss  advised.  Total encounter time more than 50 minutes  Greater than 50% was spent in counseling and coordination of care with the patient    Signed, Kate Sable, MD  12/27/2021, 11:09 AM

## 2021-12-27 NOTE — Plan of Care (Signed)
  Problem: Education: Goal: Understanding of CV disease, CV risk reduction, and recovery process will improve Outcome: Adequate for Discharge   Problem: Activity: Goal: Ability to return to baseline activity level will improve Outcome: Adequate for Discharge   Problem: Pain Managment: Goal: General experience of comfort will improve Outcome: Adequate for Discharge   Problem: Safety: Goal: Ability to remain free from injury will improve Outcome: Adequate for Discharge   Problem: Skin Integrity: Goal: Risk for impaired skin integrity will decrease Outcome: Adequate for Discharge   Problem: Health Behavior/Discharge Planning: Goal: Ability to manage health-related needs will improve Outcome: Adequate for Discharge   Problem: Metabolic: Goal: Ability to maintain appropriate glucose levels will improve Outcome: Adequate for Discharge   Problem: Nutritional: Goal: Maintenance of adequate nutrition will improve Outcome: Adequate for Discharge   Problem: Education: Goal: Understanding of cardiac disease, CV risk reduction, and recovery process will improve Outcome: Adequate for Discharge   Problem: Activity: Goal: Ability to tolerate increased activity will improve Outcome: Adequate for Discharge

## 2021-12-27 NOTE — Progress Notes (Signed)
Pt is discharging to home w/ homehealth. Spouse is picking her up. Prescription and discharge paperwork given to patient. Discharge instructions discussed with patient. All questions and concerns addressed; no further needs at this time.    12/27/21 1114  Vitals  Temp 98.1 F (36.7 C)  Temp Source Oral  BP 134/69  MAP (mmHg) 88  BP Location Right Arm  BP Method Automatic  Patient Position (if appropriate) Sitting  Pulse Rate 65  Pulse Rate Source Monitor  ECG Heart Rate 66  Resp 18  MEWS COLOR  MEWS Score Color Green  Oxygen Therapy  SpO2 99 %  O2 Device Room Air  Pain Assessment  Pain Scale 0-10  Pain Score 0

## 2021-12-27 NOTE — Progress Notes (Signed)
Mobility Specialist - Progress Note   12/27/21 0904  Mobility  Activity Ambulated with assistance in hallway;Stood at bedside;Dangled on edge of bed  Level of Assistance Contact guard assist, steadying assist  Assistive Device None (Handheld AST)  Distance Ambulated (ft) 60 ft  Activity Response Tolerated well  Mobility Referral Yes  $Mobility charge 1 Mobility   Pt EOB on RA upon arrival. Pt STS SBA and ambulates in hallway CGA with HHA. Pt endorses dizziness. RN notified. Pt returns to EOB with needs in reach.   Gretchen Short  Mobility Specialist  12/27/21 9:06 AM

## 2021-12-27 NOTE — Discharge Summary (Signed)
Physician Discharge Summary  KRESHA ABELSON NWG:[id number] DOB: 08/22/1945 DOA: 12/24/2021  PCP: Rusty Aus, MD  Admit date: 12/24/2021 Discharge date: 12/27/2021  Admitted From: Home Disposition:  Home  Recommendations for Outpatient Follow-up:  Follow up with PCP in 1-2 weeks Follow up with cardiology 1-2 weeks  Home Health:Yes PT OT RN  Equipment/Devices:None   Discharge Condition:stable CODE STATUS:Full Diet recommendation: Heart healthy  Brief/Interim Summary: 76 year old with history of obesity, diastolic CHF, CKD stage IIIa, OSA on CPAP, insulin-dependent DM2, HTN, CAD with nonobstructive disease, pacemaker 2021 admitted for shortness of breath and chest tightness.  Patient follows with outpatient cardiology and pulmonary for history of CHF and moderate restrictive lung disease.  Upon admission chest x-ray was overall negative, BNP normal, EKG unremarkable.  Patient was started on nebulizers and Solu-Medrol with symptomatic improvement.  Due to concerns of unstable angina and possible pulmonary hypertension, cardiology team was consulted for possible LHC/RHC.  LHC with non obstructive CAD.  Patient continued to have jaw pain concerning for angina.  Does not tolerate imdur.  Atenolol increased to 25 BID.  D/w cardiology.  Stable for dc.  Will followup OP PCP and cardiology.    Discharge Diagnoses:  Principal Problem:   Acute on chronic dyspnea Active Problems:   Coronary artery disease   Chronic diastolic heart failure (HCC)   Restrictive lung disease on spirometry 04/2021   Acute renal failure superimposed on stage 3a chronic kidney disease (HCC)   Physical deconditioning   Morbid obesity ([place])   Uncontrolled type 2 diabetes mellitus with hyperglycemia, with long-term current use of insulin (HCC)   OSA on CPAP   HTN (hypertension)   Hypothyroidism   Status post placement of cardiac pacemaker   Accelerating angina (HCC)   Chronic heart failure with preserved ejection  fraction (HCC)   Acute on chronic dyspnea Left-sided jaw pain, concerning for unstable angina Patient's symptoms are multifactorial in nature with combination of OSA/OHS, possible restrictive lung disease, CHF, deconditioning.   BNP is normal, troponins remain flat.  D-dimer is negative. Atenolol increased at DC to '25mg'$  PO BID FU OP cardiology   Coronary artery disease Due to concerns of unstable angina despite of flat troponin and nonischemic EKG.  Patient has failed medical management alone therefore cardiology team consulted.  Previously patient has had coronary CTA in June 2023 showing nonobstructive disease.  2D echo done.  LHC done.  Non obstructive CAD.  DC on increased atenolol dose as above.  FU OP cardiology     Restrictive lung disease on spirometry 04/2021 with possible acute flare Seen by outpatient pulmonary Dr. Raul Del.  For now continue bronchodilators, prednisone, I-S/flutter valve May benefit from outpatient high-resolution CT scan  Discharge Instructions  Discharge Instructions     Diet - low sodium heart healthy   Complete by: As directed    Increase activity slowly   Complete by: As directed       Allergies as of 12/27/2021       Reactions   Ezetimibe Diarrhea, Nausea Only   Hydrocodone-acetaminophen Other (See Comments)   Metoclopramide Diarrhea   Propoxyphene Other (See Comments)   Unsure of reaction type Unsure of reaction type Unsure of reaction type   Cefuroxime    Hydrocodone Other (See Comments)   Tramadol Hives   Ceftin [cefuroxime Axetil] Diarrhea   Codeine Rash       Penicillins Rash, Other (See Comments)   Has patient had a PCN reaction causing immediate rash, facial/tongue/throat swelling, SOB or  lightheadedness with hypotension: NO Has patient had a PCN reaction causing severe rash involving mucus membranes or skin necrosis: NO Has patient had a PCN retioion that required hospitalization NO Has patient had a PCN reaction occurring  within the last 10 years: NO If all of the above answers are "NO", then may proceed with Cephalosporin use.   Statins Other (See Comments)   Leg pain Leg pain Other reaction(s): restless leg syndrome   Vicodin [hydrocodone-acetaminophen] Other (See Comments)   passes out        Medication List     STOP taking these medications    diltiazem 120 MG 12 hr capsule Commonly known as: CARDIZEM SR   isosorbide mononitrate 30 MG 24 hr tablet Commonly known as: IMDUR   linagliptin 5 MG Tabs tablet Commonly known as: TRADJENTA       TAKE these medications    aspirin 81 MG tablet Take 81 mg by mouth at bedtime.   atenolol 25 MG tablet Commonly known as: TENORMIN Take 1 tablet (25 mg total) by mouth every morning. What changed: how much to take   azelastine 0.1 % nasal spray Commonly known as: ASTELIN Place 2 sprays into both nostrils 2 (two) times daily as needed for rhinitis.   cholecalciferol 25 MCG (1000 UNIT) tablet Commonly known as: VITAMIN D3 Take 1,000 Units by mouth daily.   diclofenac Sodium 1 % Gel Commonly known as: VOLTAREN Apply 2 g topically 2 (two) times daily.   empagliflozin 10 MG Tabs tablet Commonly known as: JARDIANCE Take 10 mg by mouth daily.   esomeprazole 40 MG capsule Commonly known as: NEXIUM Take 40 mg by mouth 2 (two) times daily.   Finacea 15 % Foam Generic drug: Azelaic Acid Apply 1 application. topically as directed. Qd to bid to aa face   gabapentin 300 MG capsule Commonly known as: NEURONTIN Take 300 mg by mouth 2 (two) times daily.   Lantus SoloStar 100 UNIT/ML Solostar Pen Generic drug: insulin glargine Inject 28 Units into the skin every morning.   levothyroxine 75 MCG tablet Commonly known as: SYNTHROID Take 75 mcg by mouth daily before breakfast.   methocarbamol 500 MG tablet Commonly known as: ROBAXIN Take 500 mg by mouth every 8 (eight) hours as needed for muscle spasms.   mometasone 0.1 % cream Commonly  known as: ELOCON Apply 1 application topically daily as needed (Rash). Qd to bid up to 5 days a week aa right lower leg prn itching   nitroGLYCERIN 0.4 MG SL tablet Commonly known as: NITROSTAT Place 1 tablet (0.4 mg total) under the tongue every 5 (five) minutes as needed for chest pain.   NOVOLOG FLEXPEN [place] Inject 10 Units into the skin as needed.   ondansetron 4 MG disintegrating tablet Commonly known as: ZOFRAN-ODT Take 1 tablet (4 mg total) by mouth every 6 (six) hours as needed for nausea or vomiting.   ondansetron 4 MG tablet Commonly known as: ZOFRAN Take 4 mg by mouth every 8 (eight) hours as needed for nausea or vomiting.   potassium chloride 10 MEQ tablet Commonly known as: KLOR-CON Take 2 tablets (20 mEq total) by mouth daily.   Praluent 150 MG/ML [place] Generic drug: Alirocumab Inject 150 mg into the skin every 14 (fourteen) days.   ranolazine 500 MG 12 hr tablet Commonly known as: RANEXA Take 2 tablets (1,000 mg total) by mouth 2 (two) times daily.   ropinirole 5 MG tablet Commonly known as: REQUIP Take 5 mg by mouth  at bedtime.   temazepam 30 MG capsule Commonly known as: RESTORIL Take 30 mg by mouth at bedtime.   torsemide 20 MG tablet Commonly known as: DEMADEX Take 2 tablets (40 mg) by mouth once daily   traMADol 50 MG tablet Commonly known as: ULTRAM Take 50 mg by mouth every 6 (six) hours as needed for moderate pain or severe pain.   trolamine salicylate 10 % cream Commonly known as: ASPERCREME Apply 1 application topically at bedtime.   TYLENOL ARTHRITIS PAIN PO Take 1,300 mg by mouth every 8 (eight) hours as needed (pain).        Follow-up Information     [facility] Follow up.   Why: They will follow up with you for your home health needs.        Minna Merritts, MD. Go in 1 week(s).   Specialty: Cardiology Why: Appointment on Tuesday, 01/02/2022 at 9:15am with Melinda Crutch, NP. Contact information: [address] [place] [address] [phone]                Allergies  Allergen Reactions   Ezetimibe Diarrhea and Nausea Only   Hydrocodone-Acetaminophen Other (See Comments)   Metoclopramide Diarrhea   Propoxyphene Other (See Comments)    Unsure of reaction type Unsure of reaction type Unsure of reaction type   Cefuroxime    Hydrocodone Other (See Comments)   Tramadol Hives   Ceftin [Cefuroxime Axetil] Diarrhea   Codeine Rash        Penicillins Rash and Other (See Comments)    Has patient had a PCN reaction causing immediate rash, facial/tongue/throat swelling, SOB or lightheadedness with hypotension: NO Has patient had a PCN reaction causing severe rash involving mucus membranes or skin necrosis: NO Has patient had a PCN retioion that required hospitalization NO Has patient had a PCN reaction occurring within the last 10 years: NO If all of the above answers are "NO", then may proceed with Cephalosporin use.   Statins Other (See Comments)    Leg pain Leg pain Other reaction(s): restless leg syndrome   Vicodin [Hydrocodone-Acetaminophen] Other (See Comments)    passes out    Consultations: Cardiology   Procedures/Studies: CARDIAC CATHETERIZATION  Result Date: 12/26/2021 Conclusions: Stable appearance of coronary arteries with severe stenosis involving proximal portion of small D1 branch and otherwise mild-moderate, non-obstructive disease. Mildly elevated left heart filling pressures. Moderately-severely elevated right heart filling pressures. Normal Fick cardiac output/index. Recommendations: Continue medical therapy of CAD.  D1 is not well-suited for PCI. Consider gentle diuresis and addition of SGLT-2 inhibitor if renal function remains stable/improved. Nelva Bush, MD [facility]  ECHOCARDIOGRAM COMPLETE  Result Date: 12/26/2021    ECHOCARDIOGRAM REPORT   Patient Name:   SHAKITA KEIR Date of Exam: 12/26/2021 Medical Rec #:  [id number]       Height:       61.0 in Accession #:    [id number]     Weight:       214.0 lb Date of Birth:  07/21/1945      BSA:          1.944 m Patient Age:    76 years       BP:           132/56 mmHg Patient Gender: F              HR:           71 bpm. Exam Location:  [facility] Procedure:  2D Echo, Cardiac Doppler, Color Doppler and Intracardiac            Opacification Agent Indications:     Dyspnea R06.00  History:         Patient has prior history of Echocardiogram examinations, most                  recent 10/09/2020. CHF, CAD; Risk Factors:Diabetes.  Sonographer:     Sherrie Sport Referring Phys:  [id number] SHERI HAMMOCK Diagnosing Phys: Nelva Bush MD IMPRESSIONS  1. Left ventricular ejection fraction, by estimation, is 55 to 60%. The left ventricle has normal function. The left ventricle has no regional wall motion abnormalities. There is mild left ventricular hypertrophy. Left ventricular diastolic parameters are consistent with Grade II diastolic dysfunction (pseudonormalization). Elevated left atrial pressure.  2. Right ventricular systolic function is normal. The right ventricular size is normal.  3. The mitral valve is degenerative. Mild mitral valve regurgitation. No evidence of mitral stenosis.  4. The aortic valve is tricuspid. There is mild calcification of the aortic valve. There is mild thickening of the aortic valve. Aortic valve regurgitation is not visualized. Aortic valve sclerosis/calcification is present, without any evidence of aortic stenosis. FINDINGS  Left Ventricle: Left ventricular ejection fraction, by estimation, is 55 to 60%. The left ventricle has normal function. The left ventricle has no regional wall motion abnormalities. Definity contrast agent was given IV to delineate the left ventricular  endocardial borders. The left ventricular internal cavity size was normal in size. There is mild left ventricular hypertrophy. Left ventricular diastolic parameters are consistent with Grade II diastolic  dysfunction (pseudonormalization). Elevated left atrial pressure. Right Ventricle: The right ventricular size is normal. No increase in right ventricular wall thickness. Right ventricular systolic function is normal. Left Atrium: Left atrial size was normal in size. Right Atrium: Right atrial size was normal in size. Pericardium: The pericardium was not well visualized. Mitral Valve: The mitral valve is degenerative in appearance. There is mild thickening of the mitral valve leaflet(s). Mild to moderate mitral annular calcification. Mild mitral valve regurgitation. No evidence of mitral valve stenosis. Tricuspid Valve: The tricuspid valve is not well visualized. Tricuspid valve regurgitation is trivial. Aortic Valve: The aortic valve is tricuspid. There is mild calcification of the aortic valve. There is mild thickening of the aortic valve. Aortic valve regurgitation is not visualized. Aortic valve sclerosis/calcification is present, without any evidence of aortic stenosis. Aortic valve mean gradient measures 4.0 mmHg. Aortic valve peak gradient measures 8.2 mmHg. Aortic valve area, by VTI measures 2.38 cm. Pulmonic Valve: The pulmonic valve was not well visualized. Pulmonic valve regurgitation is not visualized. No evidence of pulmonic stenosis. Aorta: The aortic root is normal in size and structure. Pulmonary Artery: The pulmonary artery is not well seen. Venous: The inferior vena cava was not well visualized. IAS/Shunts: The interatrial septum was not well visualized. Additional Comments: A device lead is visualized.  LEFT VENTRICLE PLAX 2D LVIDd:         4.20 cm   Diastology LVIDs:         3.00 cm   LV e' medial:    7.51 cm/s LV PW:         1.10 cm   LV E/e' medial:  16.1 LV IVS:        1.00 cm   LV e' lateral:   9.90 cm/s LVOT diam:     2.00 cm   LV E/e' lateral: 12.2 LV SV:  66 LV SV Index:   34 LVOT Area:     3.14 cm  RIGHT VENTRICLE RV Basal diam:  2.40 cm RV Mid diam:    2.50 cm RV S prime:      12.90 cm/s TAPSE (M-mode): 2.6 cm LEFT ATRIUM             Index        RIGHT ATRIUM           Index LA diam:        3.70 cm 1.90 cm/m   RA Area:     16.10 cm LA Vol (A2C):   73.4 ml 37.76 ml/m  RA Volume:   41.10 ml  21.14 ml/m LA Vol (A4C):   27.5 ml 14.15 ml/m LA Biplane Vol: 47.3 ml 24.33 ml/m  AORTIC VALVE AV Area (Vmax):    2.10 cm AV Area (Vmean):   2.10 cm AV Area (VTI):     2.38 cm AV Vmax:           143.00 cm/s AV Vmean:          97.900 cm/s AV VTI:            0.276 m AV Peak Grad:      8.2 mmHg AV Mean Grad:      4.0 mmHg LVOT Vmax:         95.40 cm/s LVOT Vmean:        65.500 cm/s LVOT VTI:          0.209 m LVOT/AV VTI ratio: 0.76  AORTA Ao Root diam: 2.70 cm MITRAL VALVE                TRICUSPID VALVE MV Area (PHT): 3.93 cm     TR Peak grad:   9.1 mmHg MV Decel Time: 193 msec     TR Vmax:        151.00 cm/s MV E velocity: 121.00 cm/s MV A velocity: 116.00 cm/s  SHUNTS MV E/A ratio:  1.04         Systemic VTI:  0.21 m                             Systemic Diam: 2.00 cm Nelva Bush MD Electronically signed by Nelva Bush MD Signature Date/Time: 12/26/2021/6:34:49 PM    Final    DG Chest 2 View  Result Date: 12/24/2021 CLINICAL DATA:  Shortness of breath. Increased work of breathing and weakness. EXAM: CHEST - 2 VIEW COMPARISON:  Chest radiograph dated August 04, 2021 FINDINGS: The heart size and mediastinal contours are within normal limits. Mild aortic atherosclerotic calcifications. Left access pacemaker with leads in the right atrium and right ventricle. Partially imaged lumbar spine hardware. Elevation of the right hemidiaphragm, unchanged. IMPRESSION: 1. No active cardiopulmonary disease. 2. Elevation of the right hemidiaphragm, unchanged. Electronically Signed   By: Keane Police D.O.   On: 12/24/2021 20:21      Subjective: Seen and examined on day of dc.  CP and jaw pain resolved.  Stable for dc home  Discharge Exam: Vitals:   12/27/21 0753 12/27/21 1114  BP:  134/69   Pulse:  65  Resp:  18  Temp:  98.1 F (36.7 C)  SpO2: 96% 99%   Vitals:   12/27/21 0408 12/27/21 0748 12/27/21 0753 12/27/21 1114  BP: (!) 124/48 (!) 135/54  134/69  Pulse: 75 74  65  Resp: 16 16  18  Temp: 98.2 F (36.8 C) 97.8 F (36.6 C)  98.1 F (36.7 C)  TempSrc:    Oral  SpO2: 96% 97% 96% 99%  Weight:      Height:        General: Pt is alert, awake, not in acute distress Cardiovascular: RRR, S1/S2 +, no rubs, no gallops Respiratory: CTA bilaterally, no wheezing, no rhonchi Abdominal: Soft, NT, ND, bowel sounds + Extremities: no edema, no cyanosis    The results of significant diagnostics from this hospitalization (including imaging, microbiology, ancillary and laboratory) are listed below for reference.     Microbiology: No results found for this or any previous visit (from the past 240 hour(s)).   Labs: BNP (last 3 results) Recent Labs    12/24/21 1855  BNP 13.2   Basic Metabolic Panel: Recent Labs  Lab 12/24/21 1855 12/26/21 0514 12/26/21 1350 12/26/21 1352 12/27/21 0323  NA 138 139 141 140 141  K 3.8 4.0 4.1 4.1 4.1  CL 100 104  --   --  106  CO2 30 26  --   --  27  GLUCOSE 209* 216*  --   --  163*  BUN 24* 30*  --   --  41*  CREATININE 1.39* 1.45*  --   --  1.76*  CALCIUM 9.3 9.3  --   --  9.0  MG  --  2.7*  --   --  2.5*   Liver Function Tests: Recent Labs  Lab 12/24/21 1855  AST 17  ALT 17  ALKPHOS 141*  BILITOT 0.6  PROT 7.4  ALBUMIN 4.0   No results for input(s): "LIPASE", "AMYLASE" in the last 168 hours. No results for input(s): "AMMONIA" in the last 168 hours. CBC: Recent Labs  Lab 12/24/21 1855 12/26/21 0514 12/26/21 1350 12/26/21 1352 12/27/21 0323  WBC 7.3 12.6*  --   --  12.6*  HGB 11.6* 10.5* 11.2* 11.2* 10.2*  HCT 37.0 32.9* 33.0* 33.0* 32.4*  MCV 84.1 82.7  --   --  83.1  PLT 249 256  --   --  241   Cardiac Enzymes: No results for input(s): "CKTOTAL", "CKMB", "CKMBINDEX", "TROPONINI" in the last 168  hours. BNP: Invalid input(s): "POCBNP" CBG: Recent Labs  Lab 12/26/21 1429 12/26/21 1706 12/26/21 2045 12/27/21 0742 12/27/21 1111  GLUCAP 128* 329* 198* 149* 180*   D-Dimer Recent Labs    12/24/21 2249  DDIMER 0.44   Hgb A1c Recent Labs    12/24/21 2249  HGBA1C 6.2*   Lipid Profile No results for input(s): "CHOL", "HDL", "LDLCALC", "TRIG", "CHOLHDL", "LDLDIRECT" in the last 72 hours. Thyroid function studies No results for input(s): "TSH", "T4TOTAL", "T3FREE", "THYROIDAB" in the last 72 hours.  Invalid input(s): "FREET3" Anemia work up No results for input(s): "VITAMINB12", "FOLATE", "FERRITIN", "TIBC", "IRON", "RETICCTPCT" in the last 72 hours. Urinalysis    Component Value Date/Time   COLORURINE YELLOW (A) 12/27/2021 1000   APPEARANCEUR HAZY (A) 12/27/2021 1000   LABSPEC 1.024 12/27/2021 1000   PHURINE 5.0 12/27/2021 1000   GLUCOSEU >=500 (A) 12/27/2021 1000   HGBUR NEGATIVE 12/27/2021 1000   BILIRUBINUR NEGATIVE 12/27/2021 1000   BILIRUBINUR negative 05/06/2021 0846   KETONESUR NEGATIVE 12/27/2021 1000   PROTEINUR NEGATIVE 12/27/2021 1000   UROBILINOGEN 0.2 05/06/2021 0846   NITRITE NEGATIVE 12/27/2021 1000   LEUKOCYTESUR NEGATIVE 12/27/2021 1000   Sepsis Labs Recent Labs  Lab 12/24/21 1855 12/26/21 0514 12/27/21 0323  WBC 7.3 12.6* 12.6*  Microbiology No results found for this or any previous visit (from the past 240 hour(s)).   Time coordinating discharge: Over 30 minutes  SIGNED:   Sidney Ace, MD  [facility] 12/27/2021, 3:36 PM Pager   If 7PM-7AM, please contact night-coverage

## 2021-12-28 ENCOUNTER — Other Ambulatory Visit: Payer: Self-pay | Admitting: Cardiovascular Disease

## 2021-12-28 DIAGNOSIS — I25110 Atherosclerotic heart disease of native coronary artery with unstable angina pectoris: Secondary | ICD-10-CM

## 2021-12-28 DIAGNOSIS — E781 Pure hyperglyceridemia: Secondary | ICD-10-CM

## 2021-12-28 DIAGNOSIS — E785 Hyperlipidemia, unspecified: Secondary | ICD-10-CM

## 2021-12-28 DIAGNOSIS — I2510 Atherosclerotic heart disease of native coronary artery without angina pectoris: Secondary | ICD-10-CM

## 2022-01-02 ENCOUNTER — Ambulatory Visit: Payer: Medicare Other | Admitting: Cardiology

## 2022-01-23 ENCOUNTER — Ambulatory Visit: Payer: Medicare Other | Attending: Cardiology | Admitting: Cardiology

## 2022-01-23 ENCOUNTER — Ambulatory Visit: Payer: Medicare Other | Admitting: Cardiology

## 2022-01-23 ENCOUNTER — Other Ambulatory Visit
Admission: RE | Admit: 2022-01-23 | Discharge: 2022-01-23 | Disposition: A | Discharge status: Home / Self Care | Payer: Medicare Other | Attending: Cardiology | Admitting: Cardiology

## 2022-01-23 ENCOUNTER — Encounter: Payer: Self-pay | Admitting: Cardiology

## 2022-01-23 VITALS — BP 154/72 | HR 88 | Ht 61.0 in | Wt 215.4 lb

## 2022-01-23 DIAGNOSIS — J984 Other disorders of lung: Secondary | ICD-10-CM | POA: Diagnosis present

## 2022-01-23 DIAGNOSIS — I10 Essential (primary) hypertension: Secondary | ICD-10-CM | POA: Insufficient documentation

## 2022-01-23 DIAGNOSIS — I5032 Chronic diastolic (congestive) heart failure: Secondary | ICD-10-CM | POA: Diagnosis not present

## 2022-01-23 DIAGNOSIS — Z79899 Other long term (current) drug therapy: Secondary | ICD-10-CM

## 2022-01-23 DIAGNOSIS — E119 Type 2 diabetes mellitus without complications: Secondary | ICD-10-CM | POA: Diagnosis present

## 2022-01-23 DIAGNOSIS — G4733 Obstructive sleep apnea (adult) (pediatric): Secondary | ICD-10-CM | POA: Diagnosis present

## 2022-01-23 DIAGNOSIS — E785 Hyperlipidemia, unspecified: Secondary | ICD-10-CM

## 2022-01-23 DIAGNOSIS — I4719 Other supraventricular tachycardia: Secondary | ICD-10-CM

## 2022-01-23 DIAGNOSIS — Z794 Long term (current) use of insulin: Secondary | ICD-10-CM | POA: Diagnosis present

## 2022-01-23 DIAGNOSIS — I25118 Atherosclerotic heart disease of native coronary artery with other forms of angina pectoris: Secondary | ICD-10-CM

## 2022-01-23 DIAGNOSIS — R55 Syncope and collapse: Secondary | ICD-10-CM | POA: Diagnosis present

## 2022-01-23 DIAGNOSIS — Z950 Presence of cardiac pacemaker: Secondary | ICD-10-CM | POA: Diagnosis present

## 2022-01-23 LAB — BASIC METABOLIC PANEL
Anion gap: 7 (ref 5–15)
BUN: 23 mg/dL (ref 8–23)
CO2: 27 mmol/L (ref 22–32)
Calcium: 8.9 mg/dL (ref 8.9–10.3)
Chloride: 103 mmol/L (ref 98–111)
Creatinine, Ser: 1.28 mg/dL — ABNORMAL HIGH (ref 0.44–1.00)
GFR, Estimated: 43 mL/min — ABNORMAL LOW (ref >60)
Glucose, Bld: 178 mg/dL — ABNORMAL HIGH (ref 70–99)
Potassium: 3.6 mmol/L (ref 3.5–5.1)
Sodium: 137 mmol/L (ref 135–145)

## 2022-01-23 MED ORDER — TORSEMIDE 20 MG PO TABS: ORAL_TABLET | ORAL | 3 refills | Status: DC

## 2022-01-23 NOTE — Progress Notes (Signed)
Continue with torsemide 40 mg daily, continue to do daily weights, watch your sodium intake, if there is an increase in weight of 2-3 pounds overnight or 5 lbs in 1 week, notify the office

## 2022-01-23 NOTE — Progress Notes (Signed)
Cardiology Clinic Note   Patient Name: [name] Date of Encounter: 01/23/2022  Primary Care Provider:  Rusty Aus, MD Primary Cardiologist:  Ida Rogue, MD  Patient Profile    76 year old female with a history coronary artery disease medically managed, HFpEF, PAT/symptomatic bradycardia with syncope status post PPM in 02/2019, type 2 diabetes, lymphedema with compression pumps, hyperlipidemia intolerant to statins, CKD stage III, hypothyroidism, OSA on CPAP, chronic dyspnea, and depression, who presents today for follow-up.  Past Medical History    Past Medical History:  Diagnosis Date   Acquired elevated diaphragm    HIGHER ON RIGHT SIDE   Anemia    Anxiety    Aortic atherosclerosis (HCC)    Arthritis    CAD (coronary artery disease)    a. 07/2017 MV: Low risk; b. 09/2017 Cath: LM nl, LAD 50p/m, D1 80ost, LCX large/nl, RCA small/nl.   Chronic back pain    stenosis.degenerative disc,some scoliosis   Chronic heart failure with preserved ejection fraction (HFpEF) ([place])    a. 07/2017 Echo: EF 60-65%, no rwma, gr2 DD, nl RV fxn.   Chronic kidney disease    stage 3   Complication of anesthesia 2018   aspirated during wrist surgery during LMA removal   Constipation    takes Stool Softener daily   Coronary artery disease    Depression    takes Cymbalta daily   Diabetes mellitus    Type 2 diabetic. Average fasting blood sugar runs high 295-188   Diastolic CHF ([place]) 41/66/0630   Per patient, diagnosed in 2018   Diverticulosis    Dysplastic nevus 09/12/2017   L sup buttock - mild    Dysplastic nevus 09/18/2018   R sup med buttocks - mild    E coli infection    GERD (gastroesophageal reflux disease)    takes Nexium daily   Grade II diastolic dysfunction    Headache    Hemorrhoids    History of colon polyps    benign   History of gout    doesn't take any meds   History of hiatal hernia    small   History of vertigo    doesn't take any meds    Hyperlipidemia    takes Praluent daily   Hypertension    currently BP medications are on hold    Hypothyroidism    takes Synthroid daily   Insomnia    takes Restoril nightly   Muscle spasm    takes Robaxin as needed   NSVT (nonsustained ventricular tachycardia) (HCC)    OSA on CPAP    Paroxysmal atrial tachycardia    Peripheral vascular disease ([place])    AAA as stated per pt / was just discovered and pt states has not been referred to vascular MD    Presence of permanent cardiac pacemaker    Restless leg    takes Requip daily   Rosacea    Symptomatic bradycardia    a. 02/2019 s/p Biotronik [place] 8 DR-T DC PPM   Syncope    a. Documented bradycardia and pauses-->02/2019 s/p Biotronik [place] 8 DR-T DC PPM.   Varicose veins    Past Surgical History:  Procedure Laterality Date   ABDOMINAL HYSTERECTOMY     with BSo   BACK SURGERY     CARDIAC CATHETERIZATION  2013   Normal   CARPAL TUNNEL RELEASE Bilateral    CATARACT EXTRACTION, BILATERAL     CHOLECYSTECTOMY     COLONOSCOPY  COLONOSCOPY WITH PROPOFOL N/A 11/25/2018   Procedure: COLONOSCOPY WITH PROPOFOL;  Surgeon: Toledo, Benay Pike, MD;  Location: [facility] ENDOSCOPY;  Service: Gastroenterology;  Laterality: N/A;   DIAGNOSTIC LAPAROSCOPY     multiple times   DILATION AND CURETTAGE OF UTERUS     ESOPHAGOGASTRODUODENOSCOPY (EGD) WITH PROPOFOL N/A 11/01/2014   Procedure: ESOPHAGOGASTRODUODENOSCOPY (EGD) WITH PROPOFOL;  Surgeon: Hulen Luster, MD;  Location: [facility]COPY;  Service: Gastroenterology;  Laterality: N/A;   ESOPHAGOGASTRODUODENOSCOPY (EGD) WITH PROPOFOL N/A 11/25/2018   Procedure: ESOPHAGOGASTRODUODENOSCOPY (EGD) WITH PROPOFOL;  Surgeon: Toledo, Benay Pike, MD;  Location: [facility] ENDOSCOPY;  Service: Gastroenterology;  Laterality: N/A;   HARDWARE REMOVAL Left 10/11/2016   Procedure: HARDWARE REMOVAL-LEFT RADIUS;  Surgeon: Lovell Sheehan, MD;  Location: [facility] ORS;  Service: Orthopedics;  Laterality: Left;  Left Radius Wrist     IMPLANTABLE CONTACT LENS IMPLANTATION     bilateral   KNEE ARTHROSCOPY Right 05/09/2020   Procedure: ARTHROSCOPY KNEE;  Surgeon: Dereck Leep, MD;  Location: [facility] ORS;  Service: Orthopedics;  Laterality: Right;   LAMINECTOMY  11/13/2015   LEFT HEART CATH AND CORONARY ANGIOGRAPHY Left 10/01/2017   Procedure: LEFT HEART CATH AND CORONARY ANGIOGRAPHY;  Surgeon: Nelva Bush, MD;  Location: [place] [facility];  Service: Cardiovascular;  Laterality: Left;   LOOP RECORDER INSERTION N/A 10/17/2017   Procedure: LOOP RECORDER INSERTION;  Surgeon: Deboraha Sprang, MD;  Location: [place] [facility];  Service: Cardiovascular;  Laterality: N/A;   LOOP RECORDER REMOVAL N/A 03/04/2019   Procedure: LOOP RECORDER REMOVAL;  Surgeon: Deboraha Sprang, MD;  Location: [place] [facility];  Service: Cardiovascular;  Laterality: N/A;   LUMBAR FUSION  11/2015   LUMBAR WOUND DEBRIDEMENT N/A 12/02/2015   Procedure: WOUND Exploration;  Surgeon: Consuella Lose, MD;  Location: [facility];  Service: Neurosurgery;  Laterality: N/A;   OPEN REDUCTION INTERNAL FIXATION (ORIF) DISTAL RADIAL FRACTURE Left 08/29/2016   Procedure: OPEN REDUCTION INTERNAL FIXATION (ORIF) DISTAL RADIAL FRACTURE;  Surgeon: Lovell Sheehan, MD;  Location: [facility];  Service: Orthopedics;  Laterality: Left;   PACEMAKER IMPLANT N/A 03/04/2019   Procedure: PACEMAKER IMPLANT;  Surgeon: Deboraha Sprang, MD;  Location: [place] [facility];  Service: Cardiovascular;  Laterality: N/A;   PICC LINE PLACE PERIPHERAL ([place] HX)     right upper arm    RIGHT/LEFT HEART CATH AND CORONARY ANGIOGRAPHY N/A 12/26/2021   Procedure: RIGHT/LEFT HEART CATH AND CORONARY ANGIOGRAPHY;  Surgeon: Nelva Bush, MD;  Location: [place] [facility];  Service: Cardiovascular;  Laterality: N/A;   ROTATOR CUFF REPAIR Right    SAVORY DILATION N/A 11/01/2014   Procedure: SAVORY DILATION;  Surgeon: Hulen Luster, MD;  [facility] END[facility];  Service: Gastroenterology;   Laterality: N/A;   TONSILLECTOMY     TRIGGER FINGER RELEASE Bilateral     Allergies  Allergies  Allergen Reactions   Ezetimibe Diarrhea and Nausea Only   Hydrocodone-Acetaminophen Other (See Comments)   Metoclopramide Diarrhea   Propoxyphene Other (See Comments)    Unsure of reaction type Unsure of reaction type Unsure of reaction type   Cefuroxime    Hydrocodone Other (See Comments)   Tramadol Hives   Ceftin [Cefuroxime Axetil] Diarrhea   Codeine Rash        Penicillins Rash and Other (See Comments)    Has patient had a PCN reaction causing immediate rash, facial/tongue/throat swelling, SOB or lightheadedness with hypotension: NO Has patient had a PCN reaction causing severe rash involving mucus membranes or  skin necrosis: NO Has patient had a PCN retioion that required hospitalization NO Has patient had a PCN reaction occurring within the last 10 years: NO If all of the above answers are "NO", then may proceed with Cephalosporin use.   Statins Other (See Comments)    Leg pain Leg pain Other reaction(s): restless leg syndrome   Vicodin [Hydrocodone-Acetaminophen] Other (See Comments)    passes out    History of Present Illness    Mushka C. Kopecky 76 year old female with coronary disease medically managed, HFpEF, PAT/symptomatic bradycardia with syncope status post PPM in 02/2019, type 2 diabetes, lymphedema with compression pumps, hyperlipidemia intolerant to statins, CKD stage III, hypothyroidism, OSA on CPAP, chronic dyspnea and depression.  Remote cardiac cath from 07/2011 showed left dominant system with moderate mid LAD and proximal D1, proximal RCA disease estimated 50% stenosis with normal LV systolic function.  She was evaluated for chest pain in 2018 MPI was low risk scan.  Coronary CTA was equivocal the concerning for LAD disease.  Subsequent left heart cath showed 80% ostial diagonal stenosis to 50% proximal to mid LAD stenosis. Medical management was recommended.   2021 she underwent permanent pacemaker implementation after experiencing syncope with loop monitoring showing prolonged bradycardia and pauses of up to 13 seconds.  08/2020 she was admitted with chest pain and ruled out.  CT of the chest was negative for PE.  Echocardiogram showed EF 60 to 65%, no regional wall motion abnormalities, G2 DD, and no significant valvular abnormalities.  Lexiscan MPI at that time showed no significant ischemia with an EF 67%.  Overall was a low risk study.  In 08/04/2021 she was seen in clinic noting a 2-week history of constant substernal chest pain that was exacerbated with ambulation and associated with diaphoresis, dyspnea, nausea, and flushing.  EKG showed sinus rhythm with nonspecific anterior ST/T wave changes.  With ongoing chest pain she was sent to the emergency department for further evaluation.  High-sensitivity troponin 3 with a delta troponin of less than 2.  She declined admission.  Imdur was titrated to 30 mg daily.  Subsequent coronary CTA on 08/10/2021 showed a calcium score of 897 which was the 92nd percentile.  Moderate calcified plaque in the proximal LAD and D1, estimated at 50/69% stenosis as well as mild proximal RCA and mid left circumflex calcified stenosis estimated CT FFR was negative.  She was last seen in clinic 09/25/2021 by Dr. Rockey Situ at that time had complaints of lower extremity edema, tachycardia and bradycardia, chest pain and shortness of breath.  She was restarted on low-dose Imdur 15 mg daily and atenolol 12.5 mg daily.  She was evaluated at the [facility] emergency department on 12/24/2021 for shortness of breath.  She reported over the last month she has had shortness of breath seems to be worsening.  She continued to deny chest pain.  Initial vitals reveal blood pressure 153/77, pulse of 82, respirations of 13, and a temp of a 98.  Pertinent labs with hemoglobin 11.6, BUN 24, serum creatinine 1.39, clinical sodium 141, with estimated GFR of 39.   Echocardiogram completed revealed LVEF of 55 to 60%, no regional wall motion abnormalities, mild LVH, G2 DD, and mild mitral valve regurgitation.  Right and left heart catheterization completed on 12/26/2021 revealed stable patient's coronary arteries with severe stenosis involving the proximal portion of the small D1 branch and otherwise mild to moderate nonobstructive disease, mildly elevated left heart filling pressures, moderately severely elevated right heart filling pressures.  She  was continued with medical therapy of CAD the D1 without well-suited for PCI she was to be diuresed with the addition of an SGLT2 inhibitor.  She was discharged on 12/27/2021 with follow-ups with cardiology as well as pulmonary for her restrictive lung disease.  She returns to clinic today stating that she has continued with shortness of breath.  When she was discharged from the hospital she was only to take 20 mg of her torsemide daily which she has done for the past month but unfortunately she has had a weight gain of 5 pounds in the last week.  She did follow with pulmonary and was given an inhaler as well for wheezing.  She was asking today about taking the metolazone to Dr. Sabra Heck had previously given her but medications had to be held during her hospitalization due to elevated kidney function.  She denies any chest pain but continues to have dyspnea on exertion and wheezing. Home Medications    Current Outpatient Medications  Medication Sig Dispense Refill   Acetaminophen (TYLENOL ARTHRITIS PAIN PO) Take 1,300 mg by mouth every 8 (eight) hours as needed (pain).     aspirin 81 MG tablet Take 81 mg by mouth at bedtime.      atenolol (TENORMIN) 25 MG tablet Take 1 tablet (25 mg total) by mouth every morning. 45 tablet 3   Azelaic Acid (FINACEA) 15 % FOAM Apply 1 application. topically as directed. Qd to bid to aa face 150 g 4   azelastine (ASTELIN) 0.1 % nasal spray Place 2 sprays into both nostrils 2 (two) times daily  as needed for rhinitis.     cholecalciferol (VITAMIN D3) 25 MCG (1000 UNIT) tablet Take 1,000 Units by mouth daily.     diclofenac Sodium (VOLTAREN) 1 % GEL Apply 2 g topically 2 (two) times daily.     empagliflozin (JARDIANCE) 10 MG TABS tablet Take 10 mg by mouth daily.     esomeprazole (NEXIUM) 40 MG capsule Take 40 mg by mouth 2 (two) times daily.     gabapentin (NEURONTIN) 300 MG capsule Take 300 mg by mouth 2 (two) times daily.     Insulin Aspart (NOVOLOG FLEXPEN [place]) Inject 10 Units into the skin as needed (diabetes).     LANTUS SOLOSTAR 100 UNIT/ML Solostar Pen Inject 28 Units into the skin every morning.     levothyroxine (SYNTHROID, LEVOTHROID) 75 MCG tablet Take 75 mcg by mouth daily before breakfast.      methocarbamol (ROBAXIN) 500 MG tablet Take 500 mg by mouth every 8 (eight) hours as needed for muscle spasms.     metolazone (ZAROXOLYN) 2.5 MG tablet Take 2.5 mg by mouth daily as needed (fluid overload).     mometasone (ELOCON) 0.1 % cream Apply 1 application topically daily as needed (Rash). Qd to bid up to 5 days a week aa right lower leg prn itching 45 g 1   nitroGLYCERIN (NITROSTAT) 0.4 MG SL tablet Place 1 tablet (0.4 mg total) under the tongue every 5 (five) minutes as needed for chest pain. 25 tablet 0   ondansetron (ZOFRAN) 4 MG tablet Take 4 mg by mouth every 8 (eight) hours as needed for nausea or vomiting.      ondansetron (ZOFRAN-ODT) 4 MG disintegrating tablet Take 1 tablet (4 mg total) by mouth every 6 (six) hours as needed for nausea or vomiting. 20 tablet 0   potassium chloride (KLOR-CON) 10 MEQ tablet Take 2 tablets (20 mEq total) by mouth daily. 60 tablet 3  PRALUENT 150 MG/ML SOAJ INJECT 150 MG UNDER THE SKIN EVERY 14 DAYS 6 mL 3   ranolazine (RANEXA) 500 MG 12 hr tablet Take 2 tablets (1,000 mg total) by mouth 2 (two) times daily. 360 tablet 3   ropinirole (REQUIP) 5 MG tablet Take 5 mg by mouth at bedtime.     temazepam (RESTORIL) 30 MG capsule Take 30 mg by  mouth at bedtime.     traMADol (ULTRAM) 50 MG tablet Take 50 mg by mouth every 6 (six) hours as needed for moderate pain or severe pain.     trolamine salicylate (ASPERCREME) 10 % cream Apply 1 application topically at bedtime.     torsemide (DEMADEX) 20 MG tablet Take 2 tablets (40 mg) by mouth once daily 120 tablet 3   No current facility-administered medications for this visit.   Facility-Administered Medications Ordered in Other Visits  Medication Dose Route Frequency Provider Last Rate Last Admin   dexamethasone (DECADRON) injection 10 mg  10 mg Intravenous Once Lovell Sheehan, MD         Family History    Family History  Problem Relation Age of Onset   Heart disease Mother    Breast cancer Mother 43   Heart disease Father    Heart attack Sister    Heart disease Sister    Heart disease Brother    She indicated that her mother is deceased. She indicated that her father is deceased. She indicated that both of her sisters are alive. She indicated that both of her brothers are alive.  Social History    Social History   Socioeconomic History   Marital status: Married    Spouse name: Not on file   Number of children: Not on file   Years of education: Not on file   Highest education level: Not on file  Occupational History   Occupation: retired  Tobacco Use   Smoking status: Never   Smokeless tobacco: Never  Vaping Use   Vaping Use: Never used  Substance and Sexual Activity   Alcohol use: No   Drug use: No   Sexual activity: Not on file  Other Topics Concern   Not on file  Social History Narrative   Not on file   Social Determinants of Health   Financial Resource Strain: Not on file  Food Insecurity: No Food Insecurity (12/25/2021)   Hunger Vital Sign    Worried About Running Out of Food in the Last Year: Never true    Ran Out of Food in the Last Year: Never true  Transportation Needs: No Transportation Needs (12/25/2021)   PRAPARE - Civil engineer, contracting (Medical): No    Lack of Transportation (Non-Medical): No  Physical Activity: Not on file  Stress: Not on file  Social Connections: Not on file  Intimate Partner Violence: Not At Risk (12/25/2021)   Humiliation, Afraid, Rape, and Kick questionnaire    Fear of Current or Ex-Partner: No    Emotionally Abused: No    Physically Abused: No    Sexually Abused: No     Review of Systems    General:  No chills, fever, night sweats or weight changes.  Endorses fatigue Cardiovascular:  No chest pain, endorses dyspnea on exertion, edema, but denies orthopnea, palpitations, paroxysmal nocturnal dyspnea. Dermatological: No rash, lesions/masses Respiratory: No cough, endorses dyspnea Urologic: No hematuria, dysuria Abdominal:   No nausea, vomiting, diarrhea, bright red blood per rectum, melena, or hematemesis Neurologic:  No  visual changes, wkns, changes in mental status. All other systems reviewed and are otherwise negative except as noted above.   Physical Exam    VS:  BP (!) 154/72 (BP Location: Right Arm, Patient Position: Sitting, Cuff Size: Large)   Pulse 88   Ht '5\' 1"'$  (1.549 m)   Wt 215 lb 6.4 oz (97.7 kg)   SpO2 97%   BMI 40.70 kg/m  , BMI Body mass index is 40.7 kg/m.     GEN: Well nourished, well developed, in no acute distress. HEENT: normal.  Glasses on Neck: Supple, no JVD, carotid bruits, or masses. Cardiac: RRR, no murmurs, rubs, or gallops. No clubbing, cyanosis, edema.  Radials 2+/PT 2+ and equal bilaterally.  Respiratory:  Respirations regular and unlabored, expiratory wheezing noted to auscultation bilaterally. GI: Soft, nontender, nondistended, BS + x 4. MS: no deformity or atrophy. Skin: warm and dry, no rash. Neuro:  Strength and sensation are intact. Psych: Normal affect.  Accessory Clinical Findings    ECG personally reviewed by me today-sinus rhythm with a rate of 88- No acute changes  Lab Results  Component Value Date   WBC 12.6 (H)  12/27/2021   HGB 10.2 (L) 12/27/2021   HCT 32.4 (L) 12/27/2021   MCV 83.1 12/27/2021   PLT 241 12/27/2021   Lab Results  Component Value Date   CREATININE 1.28 (H) 01/23/2022   BUN 23 01/23/2022   NA 137 01/23/2022   K 3.6 01/23/2022   CL 103 01/23/2022   CO2 27 01/23/2022   Lab Results  Component Value Date   ALT 17 12/24/2021   AST 17 12/24/2021   ALKPHOS 141 (H) 12/24/2021   BILITOT 0.6 12/24/2021   Lab Results  Component Value Date   CHOL 163 09/08/2020   HDL 44 09/08/2020   LDLCALC 81 09/08/2020   TRIG 192 (H) 09/08/2020   CHOLHDL 3.7 09/08/2020    Lab Results  Component Value Date   HGBA1C 6.2 (H) 12/24/2021    Assessment & Plan   1.  Chronic diastolic congestive heart failure with LVEF of 55-60%, no regional wall motion abnormalities, mild LVH, G2 DD, and mild mitral valve regurgitation.  She continues to complain of shortness of breath and increasing weight over the last week.  She was hospitalized approximately a month ago and had been on torsemide 20 mg daily.  Torsemide was increased to 40 mg daily today as well as a BMP to reassess kidney function as her serum creatinine on discharge from the hospital was 1.76.  She had previously been given 2.5 mg of metolazone from her PCP but she has not taking any of those since hospital discharge and was advised today to continue to hold on taking metolazone.  She has been encouraged to continue to do her daily weights, watch her sodium intake and her fluid intake.  She has been advised to notify the office for 2 to 3 pound weight gain overnight or 5 pound weight gain within 1 week.  She is continued on atenolol 25 mg daily and torsemide 40 mg daily with potassium supplementation of 20 mEq daily.  2.  Coronary artery disease involving native coronary arteries with stable angina.  She currently remains chest pain-free today no ischemic changes noted on EKG.  Left heart catheterization completed on 12/26/2021 showed stable  appearance of coronary arteries with severe stenosis while the proximal portion of the small D1 branch and otherwise mild to moderate nonobstructive disease.  The D1 was not well-suited  for PCI so she was continued on medical therapy of coronary artery disease.  She has been continued on aspirin 81 mg daily, Praluent injections every 2 weeks, and Ranexa 1000 mg twice daily.  She is continued with her sublingual nitro as needed.  3.  Restrictive lung disease followed by pulmonary as outpatient.  Patient had visit previously and was just scheduled a new inhaler that she has picked up today.  She is encouraged to continue on her bronchodilators and steroids with management by her pulmonologist.  4.  Hypertension blood pressure 160/71 recheck 154/72.  She is continued on her atenolol 25 mg daily and now her torsemide being increased to 40 mg daily.  She has been encouraged to continue to monitor her pressure at home as well.  5.  Hyperlipidemia with statin intolerance.  LDL 62 on 11/17/2021 she is continued on Praluent 150 mg/mL injectable every 2 weeks.  6.  Atrial tachycardia/symptomatic bradycardia/ syncopal episode with current permanent pacemaker in place.  She continues to be followed by Dr. Caryl Comes.  Last interrogation completed showed battery was stable with no changes noted to histograms.  Continue with follow-up with EP  7.  Type 2 diabetes with last hemoglobin A1c 6.4 11/17/2021.  And she is continued on Jardiance and insulin.  This continues to be managed by PCP.  8.  Obstructive sleep apnea stating that she has been compliant with CPAP  9.  Disposition patient return to clinic to see MD/APP in 4 weeks or sooner if needed  Atticus Lemberger, NP 01/23/2022, 4:04 PM

## 2022-01-23 NOTE — Patient Instructions (Signed)
Medication Instructions:  - Your physician has recommended you make the following change in your medication:   1) INCREASE Torsemide 20 mg: - take 2 tablets (40 mg) by mouth once daily   *If you need a refill on your cardiac medications before your next appointment, please call your pharmacy*   Lab Work: - Your physician recommends that you have lab work today:  [employer]  Nature conservation officer at [facility] 1st desk on the right to check in (REGISTRATION)  Lab hours: Monday- Friday (7:30 am- 5:30 pm)   If you have labs (blood work) drawn today and your tests are completely normal, you will receive your results only by: MyChart Message (if you have MyChart) OR A paper copy in the mail If you have any lab test that is abnormal or we need to change your treatment, we will call you to review the results.   Testing/Procedures: - none ordered   Follow-Up: At [facility], you and your health needs are our priority.  As part of our continuing mission to provide you with exceptional heart care, we have created designated Provider Care Teams.  These Care Teams include your primary Cardiologist (physician) and Advanced Practice Providers (APPs -  Physician Assistants and Nurse Practitioners) who all work together to provide you with the care you need, when you need it.  We recommend signing up for the patient portal called "MyChart".  Sign up information is provided on this After Visit Summary.  MyChart is used to connect with patients for Virtual Visits (Telemedicine).  Patients are able to view lab/test results, encounter notes, upcoming appointments, etc.  Non-urgent messages can be sent to your provider as well.   To learn more about what you can do with MyChart, go to [email or web address].    Your next appointment:   4 week(s)  The format for your next appointment:   In Person  Provider:   You may see Ida Rogue, MD or one of the following Advanced Practice Providers on your  designated Care Team:    Gerrie Nordmann, NP    Other Instructions N/a  Important Information About Sugar

## 2022-02-26 NOTE — Progress Notes (Unsigned)
Date:  02/27/2022   ID:  [name], DOB November 08, 1945, MRN [id number]  Patient Location:  [place] [id number]   Provider location:   [facility], [place] office  PCP:  Rusty Aus, MD  Cardiologist:  Patsy Baltimore   Chief Complaint  Patient presents with   PHQ-9 4 Week Follow-up    Patient c/o dizziness, some shortness of breath & bilateral LE edema. Medications reviewed by the patient verbally.     History of Present Illness:    [name] is a 77 y.o. female  past medical history of CAD,  cardiac catheterization in 2009 , June 2013, August 2019 moderate LAD, diagonal and RCA disease, Obesity,  diabetes,  hyperlipidemia with statin intolerance, on praluent obstructive sleep apnea on CPAP,   Tachycardia episodes,    leg edema,  lymphedema compression pumps seen in the hospital for severe hypertension and chest pain,  EF 60% in 07/2016 Loop monitor in place for syncope syncope with prolonged bradycardia more than 30 seconds.  There is at least 13 seconds of asystole with occasional scattered PVCs. Pacer, followed by Dr. Caryl Comes 02/2019  recurrent syncope assoc with nausea and vomiting- Back surgeries 2017, 2021, both with postoperative infection Cardiac catheterization November 2023, medical recommended who presents for routine followup of her coronary artery disease and diastolic CHF  Last seen by myself in clinic September 2023 Seen by one of our providers December 2023, weight 215  Admitted to [facility] 12/2021 worsening shortness of breath, chest and jaw tightness And cardiac catheterization showing stable coronary disease small vessel not amenable to PCI Briefly 2 severely elevated right heart filling pressures  Weight down to 212 today At home 209 Feels her goal weight should be less than 205  Episode last week, after having her evening medications, developed dizziness  while sitting down Could not stand  without help, legs gave out Felt out of it, Made it to bed 3 hours later By morning felt better Wonders if it was a TIA, did not want to go to the emergency room Reports feeling fine since that time Denies any neurologic deficits Did not check her vitals when she had symptoms Sedentary, difficulty moving around  EKG personally reviewed by myself on todays visit Paced rhythm rate 79 bpm  Past medical history reviewed Coronary CTA 07/2021 showed nonobstructive disease with normal FFR Comparison made to cardiac catheterization 2019, no dramatic changes Still with moderate disease in the LAD  Echo 7/22   1. Left ventricular ejection fraction, by estimation, is 60 to 65%. The  left ventricle has normal function. The left ventricle has no regional  wall motion abnormalities. Left ventricular diastolic parameters are  consistent with Grade II diastolic  dysfunction (pseudonormalization).   2. Right ventricular systolic function is normal. The right ventricular  size is normal. Tricuspid regurgitation signal is inadequate for assessing  PA pressure.   Total chol 120, LDL 48, on Praluent A1C 6.6 Non smoker  Seen in the emergency room with admission July 2022 for angina Stress test performed showing low risk study, Echocardiogram with no acute findings,  It was felt symptoms from GI versus musculoskeletal etiology, unable to exclude stressors  Pacer downloads reviewed,  stable  Other past medical hx reviewed 05/16/2018 left arm pain, etiology unclear Went to the ER, Work up negative  Cath: 10/01/2017 Relatively small LAD with moderate mid vessel disease. Small D1 with 80% ostial/proximal stenosis. Large,  dominant LCx without significant disease. Small, non-dominant RCA. Moderately elevated LVEDP. RECOMMENDATIONS: Medical therapy for LAD/diagonal disease.   syncope atrial tachycardia with documented  Pauses.  It was presumed that both events occurred while she was  sleeping. --loop monitor placed   Hospitalization for chest pain July 17, 2017 Stress test showing no ischemia, ejection fraction 85% Significant failure to thrive/weakness  Had back surgery 11/14/2015 Went to rehab Got postop infection, ecoli, sepsis  cardiac catheterization 07/25/2011 for chest pain   This showed left dominant coronary system with moderate mid LAD, proximal diagonal #1 and proximal RCA disease all estimated at 50%, normal LV systolic function.   Past Medical History:  Diagnosis Date   Acquired elevated diaphragm    HIGHER ON RIGHT SIDE   Anemia    Anxiety    Aortic atherosclerosis (HCC)    Arthritis    CAD (coronary artery disease)    a. 07/2017 MV: Low risk; b. 09/2017 Cath: LM nl, LAD 50p/m, D1 80ost, LCX large/nl, RCA small/nl.   Chronic back pain    stenosis.degenerative disc,some scoliosis   Chronic heart failure with preserved ejection fraction (HFpEF) ([place])    a. 07/2017 Echo: EF 60-65%, no rwma, gr2 DD, nl RV fxn.   Chronic kidney disease    stage 3   Complication of anesthesia 2018   aspirated during wrist surgery during LMA removal   Constipation    takes Stool Softener daily   Coronary artery disease    Depression    takes Cymbalta daily   Diabetes mellitus    Type 2 diabetic. Average fasting blood sugar runs high 478-295   Diastolic CHF ([place]) 62/13/0865   Per patient, diagnosed in 2018   Diverticulosis    Dysplastic nevus 09/12/2017   L sup buttock - mild    Dysplastic nevus 09/18/2018   R sup med buttocks - mild    E coli infection    GERD (gastroesophageal reflux disease)    takes Nexium daily   Grade II diastolic dysfunction    Headache    Hemorrhoids    History of colon polyps    benign   History of gout    doesn't take any meds   History of hiatal hernia    small   History of vertigo    doesn't take any meds   Hyperlipidemia    takes Praluent daily   Hypertension    currently BP medications are on hold    Hypothyroidism     takes Synthroid daily   Insomnia    takes Restoril nightly   Muscle spasm    takes Robaxin as needed   NSVT (nonsustained ventricular tachycardia) (HCC)    OSA on CPAP    Paroxysmal atrial tachycardia    Peripheral vascular disease ([place])    AAA as stated per pt / was just discovered and pt states has not been referred to vascular MD    Presence of permanent cardiac pacemaker    Restless leg    takes Requip daily   Rosacea    Symptomatic bradycardia    a. 02/2019 s/p Biotronik [place] 8 DR-T DC PPM   Syncope    a. Documented bradycardia and pauses-->02/2019 s/p Biotronik [place] 8 DR-T DC PPM.   Varicose veins    Past Surgical History:  Procedure Laterality Date   ABDOMINAL HYSTERECTOMY     with BSo   BACK SURGERY     CARDIAC CATHETERIZATION  2013   Normal   CARPAL TUNNEL  RELEASE Bilateral    CATARACT EXTRACTION, BILATERAL     CHOLECYSTECTOMY     COLONOSCOPY     COLONOSCOPY WITH PROPOFOL N/A 11/25/2018   Procedure: COLONOSCOPY WITH PROPOFOL;  Surgeon: Toledo, Benay Pike, MD;  Location: [facility] ENDOSCOPY;  Service: Gastroenterology;  Laterality: N/A;   DIAGNOSTIC LAPAROSCOPY     multiple times   DILATION AND CURETTAGE OF UTERUS     ESOPHAGOGASTRODUODENOSCOPY (EGD) WITH PROPOFOL N/A 11/01/2014   Procedure: ESOPHAGOGASTRODUODENOSCOPY (EGD) WITH PROPOFOL;  Surgeon: Hulen Luster, MD;  Location: [facility] ENDOSCOPY;  Service: Gastroenterology;  Laterality: N/A;   ESOPHAGOGASTRODUODENOSCOPY (EGD) WITH PROPOFOL N/A 11/25/2018   Procedure: ESOPHAGOGASTRODUODENOSCOPY (EGD) WITH PROPOFOL;  Surgeon: Toledo, Benay Pike, MD;  Location: [facility] ENDOSCOPY;  Service: Gastroenterology;  Laterality: N/A;   HARDWARE REMOVAL Left 10/11/2016   Procedure: HARDWARE REMOVAL-LEFT RADIUS;  Surgeon: Lovell Sheehan, MD;  Location: [facility] ORS;  Service: Orthopedics;  Laterality: Left;  Left Radius Wrist    IMPLANTABLE CONTACT LENS IMPLANTATION     bilateral   KNEE ARTHROSCOPY Right 05/09/2020   Procedure: ARTHROSCOPY  KNEE;  Surgeon: Dereck Leep, MD;  Location: [facility] ORS;  Service: Orthopedics;  Laterality: Right;   LAMINECTOMY  11/13/2015   LEFT HEART CATH AND CORONARY ANGIOGRAPHY Left 10/01/2017   Procedure: LEFT HEART CATH AND CORONARY ANGIOGRAPHY;  Surgeon: Nelva Bush, MD;  Location: [place] CV LAB;  Service: Cardiovascular;  Laterality: Left;   LOOP RECORDER INSERTION N/A 10/17/2017   Procedure: LOOP RECORDER INSERTION;  Surgeon: Deboraha Sprang, MD;  Location: [place] [facility];  Service: Cardiovascular;  Laterality: N/A;   LOOP RECORDER REMOVAL N/A 03/04/2019   Procedure: LOOP RECORDER REMOVAL;  Surgeon: Deboraha Sprang, MD;  Location: [place] CV LAB;  Service: Cardiovascular;  Laterality: N/A;   LUMBAR FUSION  11/2015   LUMBAR WOUND DEBRIDEMENT N/A 12/02/2015   Procedure: WOUND Exploration;  Surgeon: Consuella Lose, MD;  Location: [place];  Service: Neurosurgery;  Laterality: N/A;   OPEN REDUCTION INTERNAL FIXATION (ORIF) DISTAL RADIAL FRACTURE Left 08/29/2016   Procedure: OPEN REDUCTION INTERNAL FIXATION (ORIF) DISTAL RADIAL FRACTURE;  Surgeon: Lovell Sheehan, MD;  Location: [facility] ORS;  Service: Orthopedics;  Laterality: Left;   PACEMAKER IMPLANT N/A 03/04/2019   Procedure: PACEMAKER IMPLANT;  Surgeon: Deboraha Sprang, MD;  Location: [place] [facility];  Service: Cardiovascular;  Laterality: N/A;   PICC LINE PLACE PERIPHERAL ([place] HX)     right upper arm    RIGHT/LEFT HEART CATH AND CORONARY ANGIOGRAPHY N/A 12/26/2021   Procedure: RIGHT/LEFT HEART CATH AND CORONARY ANGIOGRAPHY;  Surgeon: Nelva Bush, MD;  Location: [place] [facility];  Service: Cardiovascular;  Laterality: N/A;   ROTATOR CUFF REPAIR Right    SAVORY DILATION N/A 11/01/2014   Procedure: SAVORY DILATION;  Surgeon: Hulen Luster, MD;  Location[facility] ENDOSCOPY;  Service: Gastroenterology;  Laterality: N/A;   TONSILLECTOMY     TRIGGER FINGER RELEASE Bilateral      Current Meds  Medication Sig    Acetaminophen (TYLENOL ARTHRITIS PAIN PO) Take 1,300 mg by mouth every 8 (eight) hours as needed (pain).   albuterol (VENTOLIN HFA) 108 (90 Base) MCG/ACT inhaler Inhale 1 puff into the lungs every 4 (four) hours as needed.   aspirin 81 MG tablet Take 81 mg by mouth at bedtime.    atenolol (TENORMIN) 25 MG tablet Take 1 tablet (25 mg total) by mouth every morning.   Azelaic Acid (FINACEA) 15 % FOAM Apply 1 application. topically as directed.  Qd to bid to aa face   azelastine (ASTELIN) 0.1 % nasal spray Place 2 sprays into both nostrils 2 (two) times daily as needed for rhinitis.   cholecalciferol (VITAMIN D3) 25 MCG (1000 UNIT) tablet Take 1,000 Units by mouth daily.   diclofenac Sodium (VOLTAREN) 1 % GEL Apply 2 g topically 2 (two) times daily.   empagliflozin (JARDIANCE) 10 MG TABS tablet Take 10 mg by mouth daily.   esomeprazole (NEXIUM) 40 MG capsule Take 40 mg by mouth 2 (two) times daily.   gabapentin (NEURONTIN) 300 MG capsule Take 300 mg by mouth 2 (two) times daily.   Insulin Aspart (NOVOLOG FLEXPEN [place]) Inject 10 Units into the skin as needed (diabetes).   LANTUS SOLOSTAR 100 UNIT/ML Solostar Pen Inject 28 Units into the skin every morning.   levothyroxine (SYNTHROID, LEVOTHROID) 75 MCG tablet Take 75 mcg by mouth daily before breakfast.    methocarbamol (ROBAXIN) 500 MG tablet Take 500 mg by mouth every 8 (eight) hours as needed for muscle spasms.   metolazone (ZAROXOLYN) 2.5 MG tablet Take 2.5 mg by mouth daily as needed (fluid overload).   mometasone (ELOCON) 0.1 % cream Apply 1 application topically daily as needed ([place]). Qd to bid up to 5 days a week aa right lower leg prn itching   nitroGLYCERIN (NITROSTAT) 0.4 MG SL tablet Place 1 tablet (0.4 mg total) under the tongue every 5 (five) minutes as needed for chest pain.   ondansetron (ZOFRAN) 4 MG tablet Take 4 mg by mouth every 8 (eight) hours as needed for nausea or vomiting.    ondansetron (ZOFRAN-ODT) 4 MG disintegrating tablet  Take 1 tablet (4 mg total) by mouth every 6 (six) hours as needed for nausea or vomiting.   potassium chloride (KLOR-CON) 10 MEQ tablet Take 2 tablets (20 mEq total) by mouth daily.   PRALUENT 150 MG/ML SOAJ INJECT 150 MG UNDER THE SKIN EVERY 14 DAYS   ranolazine (RANEXA) 500 MG 12 hr tablet Take 2 tablets (1,000 mg total) by mouth 2 (two) times daily.   ropinirole (REQUIP) 5 MG tablet Take 5 mg by mouth at bedtime.   temazepam (RESTORIL) 30 MG capsule Take 30 mg by mouth at bedtime.   torsemide (DEMADEX) 20 MG tablet Take 2 tablets (40 mg) by mouth once daily   traMADol (ULTRAM) 50 MG tablet Take 50 mg by mouth every 6 (six) hours as needed for moderate pain or severe pain.   trolamine salicylate (ASPERCREME) 10 % cream Apply 1 application topically at bedtime.     Allergies:   Ezetimibe, Hydrocodone-acetaminophen, Metoclopramide, Propoxyphene, Cefuroxime, Hydrocodone, Tramadol, Ceftin [cefuroxime axetil], Codeine, Penicillins, Statins, and Vicodin [hydrocodone-acetaminophen]   Social History   Tobacco Use   Smoking status: Never   Smokeless tobacco: Never  Vaping Use   Vaping Use: Never used  Substance Use Topics   Alcohol use: No   Drug use: No     Family Hx: The patient's family history includes Breast cancer (age of onset: 43) in her mother; Heart attack in her sister; Heart disease in her brother, father, mother, and sister.  ROS:   Please see the history of present illness.    Review of Systems  Constitutional: Negative.   HENT: Negative.    Respiratory: Negative.    Cardiovascular: Negative.   Gastrointestinal: Negative.   Musculoskeletal:  Positive for back pain.  Neurological: Negative.   Psychiatric/Behavioral: Negative.    All other systems reviewed and are negative.    Labs/Other Tests  and Data Reviewed:    Recent Labs: 12/24/2021: ALT 17; B Natriuretic Peptide 21.3 12/27/2021: Hemoglobin 10.2; Magnesium 2.5; Platelets 241 01/23/2022: BUN 23; Creatinine,  Ser 1.28; Potassium 3.6; Sodium 137   Recent Lipid Panel Lab Results  Component Value Date/Time   CHOL 163 09/08/2020 06:13 AM   CHOL 163 07/26/2015 08:02 AM   TRIG 192 (H) 09/08/2020 06:13 AM   HDL 44 09/08/2020 06:13 AM   HDL 32 (L) 07/26/2015 08:02 AM   CHOLHDL 3.7 09/08/2020 06:13 AM   LDLCALC 81 09/08/2020 06:13 AM   LDLCALC 94 07/26/2015 08:02 AM    Wt Readings from Last 3 Encounters:  02/27/22 212 lb 4 oz (96.3 kg)  01/23/22 215 lb 6.4 oz (97.7 kg)  12/24/21 214 lb (97.1 kg)     Exam:    Vital Signs: Vital signs may also be detailed in the HPI BP (!) 138/58 (BP Location: Right Arm, Patient Position: Sitting, Cuff Size: Normal)   Pulse 79   Ht '5\' 1"'$  (1.549 m)   Wt 212 lb 4 oz (96.3 kg)   SpO2 94%   BMI 40.10 kg/m   Constitutional:  oriented to person, place, and time. No distress.  HENT:  Head: Grossly normal Eyes:  no discharge. No scleral icterus.  Neck: No JVD, no carotid bruits  Cardiovascular: Regular rate and rhythm, no murmurs appreciated Pulmonary/Chest: Clear to auscultation bilaterally, no wheezes or rails Abdominal: Soft.  no distension.  no tenderness.  Musculoskeletal: Normal range of motion Neurological:  normal muscle tone. Coordination normal. 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) Followed by Dr. Therisa Doyne downloads reviewed Nonsustained VT summer 2023 low arrhythmia  Chronic diastolic heart failure  ([place]) Recommend IV diuretic regimen as below: For weight <205, stay on torsemide 40 daily For weight >205 to 210, torsemide 40 in Am, extra torsemide 20 mg after lunch For weight >210, take torsemide 40 mg twice a day For weight >210 that does not come down, take metolazone  Sinus node dysfunction (HCC) Followed by Dr. Caryl Comes Has pacer months reviewed  Lymphedema Stable sx, minimal Previously required compression pumps,   Essential hypertension Blood pressure is well controlled on today's visit. No changes made to the medications.  OSA on CPAP Continues to use her CPAP  Syncope Pacemaker in place Previous episodes in the setting of nausea vomiting, vasovagal No recent sx  Weakness Episode last week, laying down in bed overnight until symptoms resolved Unclear, unable to exclude TIA Recurrent symptoms recommend she go to the emergency room for further valuation  CRI Discussed, avoid NSAIDs  Stable creatinine 1.28, GFR 43   Total encounter time more than 40 minutes  Greater than 50% was spent in counseling and coordination of care with the patient    Signed, Ana Rogue, MD  02/27/2022 11:41 AM    [place] Office [address], [place], [place] [address]

## 2022-02-27 ENCOUNTER — Encounter: Payer: Self-pay | Admitting: Cardiovascular Disease

## 2022-02-27 ENCOUNTER — Ambulatory Visit: Payer: Medicare Other | Attending: Cardiovascular Disease | Admitting: Cardiovascular Disease

## 2022-02-27 VITALS — BP 138/58 | HR 79 | Ht 61.0 in | Wt 212.2 lb

## 2022-02-27 DIAGNOSIS — I4719 Other supraventricular tachycardia: Secondary | ICD-10-CM | POA: Diagnosis present

## 2022-02-27 DIAGNOSIS — I10 Essential (primary) hypertension: Secondary | ICD-10-CM

## 2022-02-27 DIAGNOSIS — J984 Other disorders of lung: Secondary | ICD-10-CM | POA: Diagnosis present

## 2022-02-27 DIAGNOSIS — E782 Mixed hyperlipidemia: Secondary | ICD-10-CM | POA: Diagnosis present

## 2022-02-27 DIAGNOSIS — I5032 Chronic diastolic (congestive) heart failure: Secondary | ICD-10-CM

## 2022-02-27 DIAGNOSIS — E785 Hyperlipidemia, unspecified: Secondary | ICD-10-CM

## 2022-02-27 DIAGNOSIS — E119 Type 2 diabetes mellitus without complications: Secondary | ICD-10-CM | POA: Insufficient documentation

## 2022-02-27 DIAGNOSIS — Z794 Long term (current) use of insulin: Secondary | ICD-10-CM | POA: Diagnosis present

## 2022-02-27 DIAGNOSIS — G4733 Obstructive sleep apnea (adult) (pediatric): Secondary | ICD-10-CM | POA: Diagnosis present

## 2022-02-27 DIAGNOSIS — I25118 Atherosclerotic heart disease of native coronary artery with other forms of angina pectoris: Secondary | ICD-10-CM | POA: Diagnosis present

## 2022-02-27 DIAGNOSIS — Z950 Presence of cardiac pacemaker: Secondary | ICD-10-CM

## 2022-02-27 MED ORDER — TORSEMIDE 20 MG PO TABS: 40.0000 mg | ORAL_TABLET | Freq: Two times a day (BID) | ORAL | 1 refills | Status: DC

## 2022-02-27 NOTE — Patient Instructions (Addendum)
Medication Instructions:  For weight <205, stay on torsemide 40 daily For weight >205 to 210, torsemide 40 in Am, extra torsemide 20 mg after lunch For weight >210, take torsemide 40 mg twice a day For weight >210 that does not come down, take metolazone  If you need a refill on your cardiac medications before your next appointment, please call your pharmacy.   Lab work: No new labs needed  Testing/Procedures: No new testing needed  Follow-Up: At [facility], you and your health needs are our priority.  As part of our continuing mission to provide you with exceptional heart care, we have created designated Provider Care Teams.  These Care Teams include your primary Cardiologist (physician) and Advanced Practice Providers (APPs -  Physician Assistants and Nurse Practitioners) who all work together to provide you with the care you need, when you need it.  You will need a follow up appointment in 6 months  Providers on your designated Care Team:   Murray Hodgkins, NP Christell Faith, PA-C Cadence Kathlen Mody, [place]  COVID-19 Vaccine Information can be found at: [email or web address].uk For questions related to vaccine distribution or appointments, please email [email or web address] or call [phone].

## 2022-02-28 ENCOUNTER — Ambulatory Visit: Payer: Medicare Other | Attending: Internal Medicine

## 2022-02-28 DIAGNOSIS — I4729 Other ventricular tachycardia: Secondary | ICD-10-CM

## 2022-03-01 ENCOUNTER — Telehealth: Payer: Self-pay | Admitting: Internal Medicine

## 2022-03-01 ENCOUNTER — Other Ambulatory Visit: Payer: Self-pay | Admitting: Internal Medicine

## 2022-03-01 LAB — CUP PACEART REMOTE DEVICE CHECK
Battery Voltage: 80
Date Time Interrogation Session: 20240117093330
Implantable Lead Connection Status: 753985
Implantable Lead Connection Status: 753985
Implantable Lead Implant Date: 20210120
Implantable Lead Implant Date: 20210120
Implantable Lead Location: 753859
Implantable Lead Location: 753860
Implantable Lead Model: 5076
Implantable Lead Model: 5076
Implantable Pulse Generator Implant Date: 20210120
Pulse Gen Model: 407145
Pulse Gen Serial Number: 69765687

## 2022-03-01 NOTE — Telephone Encounter (Signed)
Pt c/o medication issue:  1. Name of Medication:  potassium chloride (KLOR-CON) 10 MEQ tablet   2. How are you currently taking this medication (dosage and times per day)? Takes according to weight   3. Are you having a reaction (difficulty breathing--STAT)? No   4. What is your medication issue? States prescription needs to be 4 tablets daily.

## 2022-03-01 NOTE — Telephone Encounter (Signed)
I spoke with the patient. She saw Dr. Rockey Situ on 02/27/22 and her torsemide was changed to: Medication Instructions:  For weight <205, stay on torsemide 40 daily For weight >205 to 210, torsemide 40 in Am, extra torsemide 20 mg after lunch For weight >210, take torsemide 40 mg twice a day For weight >210 that does not come down, take metolazone  The patient wanted to make sure her torsemide was updated to reflect the changes.  Patient advised her RX was sent in as below: torsemide (DEMADEX) 20 MG tablet 360 tablet 1 02/27/2022    Sig - Route: Take 2 tablets (40 mg total) by mouth 2 (two) times daily. As directed - Oral   Sent to pharmacy as: torsemide (DEMADEX) 20 MG tablet   E-Prescribing Status: Receipt confirmed by pharmacy (02/27/2022 11:56 AM EST)    Time, [place]    She also wanted to make sure her potassium was sent in correctly. I advised her potassium RX was not changed as this was not addressed by Dr. Rockey Situ. She would like her potassium RX to be updated to: Potassium 10 meq- take 2 tablets (20 meq) by mouth twice daily as directed  Per the patient, she take 1 tablet of potassium for every 1 tablet of torsemide. The patient is aware I will forward to Dr. Rockey Situ to ok the changes in RX send this in for her.   The patient voices understanding and is agreeable.

## 2022-03-05 ENCOUNTER — Telehealth: Payer: Self-pay | Admitting: Cardiovascular Disease

## 2022-03-05 MED ORDER — POTASSIUM CHLORIDE ER 10 MEQ PO TBCR: EXTENDED_RELEASE_TABLET | ORAL | 1 refills | Status: DC

## 2022-03-05 NOTE — Telephone Encounter (Signed)
The patient has been made aware the RX has been updated at the pharmacy.

## 2022-03-05 NOTE — Telephone Encounter (Signed)
Pt c/o medication issue:  1. Name of Medication: Potassium  2. How are you currently taking this medication (dosage and times per day)? N/A  3. Are you having a reaction (difficulty breathing--STAT)? no  4. What is your medication issue? Patient told Judeen Hammans at [facility] that Dr. Rockey Situ was to be sending in a new prescription for Potassium. Judeen Hammans states that they have not received it, please advise.

## 2022-03-05 NOTE — Telephone Encounter (Signed)
Dr. Rockey Situ has given an ok to change the patient's Potassium RX: Potassium 10 meq- take 2 tablets (20 meq) by mouth twice daily as directed   This has been updated at the pharmacy and the patient is aware.  She was very appreciative of the call back.

## 2022-03-05 NOTE — Telephone Encounter (Signed)
Verbal order received from Dr. Rockey Situ: Madaline Brilliant to change potassium RX to-  Potassium 10 meq- take 2 tablets (20 meq) by mouth twice daily as directed

## 2022-03-22 NOTE — Progress Notes (Signed)
Remote pacemaker transmission.   

## 2022-03-27 ENCOUNTER — Encounter: Payer: Self-pay | Admitting: Internal Medicine

## 2022-03-27 ENCOUNTER — Ambulatory Visit (INDEPENDENT_AMBULATORY_CARE_PROVIDER_SITE_OTHER): Payer: Medicare Other | Admitting: Internal Medicine

## 2022-03-27 ENCOUNTER — Telehealth: Payer: Self-pay

## 2022-03-27 VITALS — BP 122/60 | HR 70 | Temp 97.9°F | Ht 61.5 in | Wt 209.0 lb

## 2022-03-27 DIAGNOSIS — G4733 Obstructive sleep apnea (adult) (pediatric): Secondary | ICD-10-CM | POA: Diagnosis not present

## 2022-03-27 DIAGNOSIS — I2510 Atherosclerotic heart disease of native coronary artery without angina pectoris: Secondary | ICD-10-CM

## 2022-03-27 DIAGNOSIS — R0602 Shortness of breath: Secondary | ICD-10-CM

## 2022-03-27 MED ORDER — ALBUTEROL SULFATE (2.5 MG/3ML) 0.083% IN NEBU: 2.5000 mg | INHALATION_SOLUTION | RESPIRATORY_TRACT | 12 refills | Status: AC | PRN

## 2022-03-27 NOTE — Patient Instructions (Addendum)
A+ Keep up the great work!!  Continue CPAP as prescribed  Follow up cardiology as scheduled

## 2022-03-27 NOTE — Telephone Encounter (Signed)
Per Dr. Mortimer Fries verbally- contact patient to confirm that tyroid nodule is being followed. Spoke to patient, she stated that Dr. Jenetta Downer' Dina Rich is following.  Dr. Mortimer Fries has been made aware verbally.  Nothing further needed.

## 2022-03-27 NOTE — Progress Notes (Signed)
Name: [name] MRN: [id number] DOB: [birth year]    CHIEF COMPLAINT:  ASSESSMENT OF SLEEP APNEA   HISTORY OF PRESENT ILLNESS: Patient is seen today for Assessment of OSA Patient  has been having sleep problems for many years Diagnosed with OSA 10 years ago has been on CPAP ever since    Discussed sleep data and reviewed with patient.  Encouraged proper weight management.  Discussed driving precautions and its relationship with hypersomnolence.  Discussed operating dangerous equipment and its relationship with hypersomnolence.  Discussed sleep hygiene, and benefits of a fixed sleep waked time.  The importance of getting eight or more hours of sleep discussed with patient.  Discussed limiting the use of the computer and television before bedtime.  Decrease naps during the day, so night time sleep will become enhanced.  Limit caffeine, and sleep deprivation.  HTN, stroke, and heart failure are potential risk factors.    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, mining   Currently getting home PT has had back surgery in the past Right hemidiaphragm elevation   PAST MEDICAL HISTORY :   has a past medical history of Acquired elevated diaphragm, Anemia, Anxiety, Aortic atherosclerosis ([place]), Arthritis, CAD (coronary artery disease), Chronic back pain, Chronic heart failure with preserved ejection fraction (HFpEF) ([place]), Chronic kidney disease, Complication of anesthesia (2018), Constipation, Coronary artery disease, Depression, Diabetes  mellitus, Diastolic CHF ([place]) (123456), Diverticulosis, Dysplastic nevus (09/12/2017), Dysplastic nevus (09/18/2018), E coli infection, GERD (gastroesophageal reflux disease), Grade II diastolic dysfunction, Headache, Hemorrhoids, History of colon polyps, History of gout, History of hiatal hernia, History of vertigo, Hyperlipidemia, Hypertension, Hypothyroidism, Insomnia, Muscle spasm, NSVT (nonsustained ventricular tachycardia) (HCC), OSA on CPAP, Paroxysmal atrial tachycardia, Peripheral vascular disease ([place]), Presence of permanent cardiac pacemaker, Restless leg, Rosacea, Symptomatic bradycardia, Syncope, and Varicose veins.  has a past surgical history that includes Tonsillectomy; Cholecystectomy; Cataract extraction, bilateral; Implantable contact lens implantation; Esophagogastroduodenoscopy (egd) with propofol (N/A, 11/01/2014); Savory dilation (N/A, 11/01/2014); Abdominal hysterectomy; Cardiac catheterization (2013); Trigger finger release (Bilateral); Carpal tunnel release (Bilateral); Rotator cuff repair (Right); Dilation and curettage of uterus; Diagnostic laparoscopy; Colonoscopy; Laminectomy (11/13/2015); Lumbar wound debridement (N/A, 12/02/2015); PICC LINE PLACE PERIPHERAL ([place] HX); Open reduction internal fixation (orif) distal radial fracture (Left, 08/29/2016); Lumbar fusion (11/2015); Hardware Removal (Left, 10/11/2016); LEFT HEART CATH AND CORONARY ANGIOGRAPHY (Left, 10/01/2017); LOOP RECORDER INSERTION (N/A, 10/17/2017); Colonoscopy with propofol (N/A, 11/25/2018); Esophagogastroduodenoscopy (egd) with propofol (N/A, 11/25/2018); PACEMAKER IMPLANT (N/A, 03/04/2019); LOOP RECORDER REMOVAL (N/A, 03/04/2019); Knee arthroscopy (Right, 05/09/2020); Back surgery; and RIGHT/LEFT HEART CATH AND CORONARY ANGIOGRAPHY (N/A, 12/26/2021). Prior to Admission medications   Medication Sig Start Date End Date Taking? Authorizing Provider  Acetaminophen (TYLENOL ARTHRITIS PAIN PO) Take 1,300 mg by mouth  every 8 (eight) hours as needed (pain).   Yes [provider]  albuterol (VENTOLIN HFA) 108 (90 Base) MCG/ACT inhaler Inhale 1 puff into the lungs every 4 (four) hours as needed. 01/23/22 01/23/23 Yes [provider]  aspirin 81 MG tablet Take 81 mg by mouth at bedtime.    Yes [provider]  atenolol (TENORMIN) 25 MG tablet Take 1 tablet (  25 mg total) by mouth every morning. 12/27/21  Yes Sreenath, Sudheer B, MD  Azelaic Acid (FINACEA) 15 % FOAM Apply 1 application. topically as directed. Qd to bid to aa face 06/29/21  Yes Ralene Bathe, MD  azelastine (ASTELIN) 0.1 % nasal spray Place 2 sprays into both nostrils 2 (two) times daily as needed for rhinitis.   Yes [provider]  cholecalciferol (VITAMIN D3) 25 MCG (1000 UNIT) tablet Take 1,000 Units by mouth daily.   Yes [provider]  diclofenac Sodium (VOLTAREN) 1 % GEL Apply 2 g topically 2 (two) times daily.   Yes [provider]  empagliflozin (JARDIANCE) 10 MG TABS tablet Take 10 mg by mouth daily. 12/15/21 12/15/22 Yes [provider]  esomeprazole (NEXIUM) 40 MG capsule Take 40 mg by mouth 2 (two) times daily.   Yes [provider]  gabapentin (NEURONTIN) 300 MG capsule Take 300 mg by mouth 2 (two) times daily.   Yes [provider]  Insulin Aspart (NOVOLOG FLEXPEN [place]) Inject 10 Units into the skin as needed (diabetes).   Yes [provider]  LANTUS SOLOSTAR 100 UNIT/ML Solostar Pen Inject 28 Units into the skin every morning. 06/27/20  Yes [provider]  levothyroxine (SYNTHROID, LEVOTHROID) 75 MCG tablet Take 75 mcg by mouth daily before breakfast.  11/24/12  Yes [provider]  methocarbamol (ROBAXIN) 500 MG tablet Take 500 mg by mouth every 8 (eight) hours as needed for muscle spasms.   Yes [provider]  metolazone (ZAROXOLYN) 2.5 MG tablet Take 2.5 mg by mouth daily as needed (fluid overload). 12/29/21  Yes  [provider]  mometasone (ELOCON) 0.1 % cream Apply 1 application topically daily as needed (Rash). Qd to bid up to 5 days a week aa right lower leg prn itching 06/22/20  Yes Ralene Bathe, MD  nitroGLYCERIN (NITROSTAT) 0.4 MG SL tablet Place 1 tablet (0.4 mg total) under the tongue every 5 (five) minutes as needed for chest pain. 07/13/21 01/25/23 Yes Gollan, Kathlene November, MD  ondansetron (ZOFRAN) 4 MG tablet Take 4 mg by mouth every 8 (eight) hours as needed for nausea or vomiting.  07/19/17  Yes [provider]  ondansetron (ZOFRAN-ODT) 4 MG disintegrating tablet Take 1 tablet (4 mg total) by mouth every 6 (six) hours as needed for nausea or vomiting. 05/06/21  Yes Delman Kitten, MD  potassium chloride (KLOR-CON) 10 MEQ tablet Take 2 tablet (20 meq) by mouth twice daily as directed 03/05/22  Yes Gollan, Kathlene November, MD  PRALUENT 150 MG/ML SOAJ INJECT 150 MG UNDER THE SKIN EVERY 14 DAYS 12/28/21  Yes Minna Merritts, MD  ranolazine (RANEXA) 500 MG 12 hr tablet Take 2 tablets (1,000 mg total) by mouth 2 (two) times daily. 08/18/21  Yes Dunn, Areta Haber, PA-C  ropinirole (REQUIP) 5 MG tablet Take 5 mg by mouth at bedtime.   Yes [provider]  temazepam (RESTORIL) 30 MG capsule Take 30 mg by mouth at bedtime.   Yes [provider]  torsemide (DEMADEX) 20 MG tablet Take 2 tablets (40 mg total) by mouth 2 (two) times daily. As directed 02/27/22  Yes Gollan, Kathlene November, MD  traMADol (ULTRAM) 50 MG tablet Take 50 mg by mouth every 6 (six) hours as needed for moderate pain or severe pain.   Yes [provider]  trolamine salicylate (ASPERCREME) 10 % cream Apply 1 application topically at bedtime.   Yes [provider]   Allergies  Allergen Reactions   Ezetimibe Diarrhea and Nausea Only   Hydrocodone-Acetaminophen Other (See Comments)   Metoclopramide Diarrhea   Propoxyphene Other (See Comments)    Unsure of reaction type Unsure of reaction type Unsure of  reaction type   Cefuroxime    Hydrocodone Other (See Comments)   Tramadol Hives   Ceftin [Cefuroxime Axetil] Diarrhea   Codeine Rash        Penicillins Rash and Other (See Comments)    Has patient had a PCN reaction causing immediate rash, facial/tongue/throat swelling, SOB or lightheadedness with hypotension: NO Has patient had a PCN reaction causing severe rash involving mucus membranes or skin necrosis: NO Has patient had a PCN retioion that required hospitalization NO Has patient had a PCN reaction occurring within the last 10 years: NO If all of the above answers are "NO", then may proceed with Cephalosporin use.   Statins Other (See Comments)    Leg pain Leg pain Other reaction(s): restless leg syndrome   Vicodin [Hydrocodone-Acetaminophen] Other (See Comments)    passes out    FAMILY HISTORY:  family history includes Breast cancer (age of onset: 26) in her mother; Heart attack in her sister; Heart disease in her brother, father, mother, and sister. SOCIAL HISTORY:  reports that she has never smoked. She has never used smokeless tobacco. She reports that she does not drink alcohol and does not use drugs.   Review of Systems:  Gen:  Denies  fever, sweats, chills weight loss  HEENT: Denies blurred vision, double vision, ear pain, eye pain, hearing loss, nose bleeds, sore throat Cardiac:  No dizziness, chest pain or heaviness, chest tightness,edema, No JVD Resp:   No cough, -sputum production, -shortness of breath,-wheezing, -hemoptysis,  Gi: Denies swallowing difficulty, stomach pain, nausea or vomiting, diarrhea, constipation, bowel incontinence Gu:  Denies bladder incontinence, burning urine Ext:   Denies Joint pain, stiffness or swelling Skin: Denies  skin rash, easy bruising or bleeding or hives Endoc:  Denies polyuria, polydipsia , polyphagia or weight change Psych:   Denies depression, insomnia or hallucinations  Other:  All other systems negative   ALL OTHER ROS  ARE NEGATIVE   BP 122/60 (BP Location: Right Arm, Cuff Size: Normal)   Pulse 70   Temp 97.9 F (36.6 C) (Temporal)   Ht 5' 1.5" (1.562 m)   Wt 209 lb (94.8 kg)   SpO2 100%   BMI 38.85 kg/m     Physical Examination:   General Appearance: No distress  EYES PERRLA, EOM intact.   NECK Supple, No JVD Pulmonary: normal breath sounds, No wheezing.  CardiovascularNormal S1,S2.  No m/r/g.   Abdomen: Benign, Soft, non-tender. Skin:   warm, no rashes, no ecchymosis  Extremities: normal, no cyanosis, clubbing. Neuro:without focal findings,  speech normal  PSYCHIATRIC: Mood, affect within normal limits.   ALL OTHER ROS ARE NEGATIVE    ASSESSMENT AND PLAN SYNOPSIS  77 year old pleasant white female with significant history of sleep apnea with a diagnosis of underlying coronary artery disease with multiple surgeries in the past with elevated right hemidiaphragm with probable underlying restrictive physiology  Patient with a diagnosis of sleep apnea Excellent compliance report Reviewed in detail with patient AutoCPAP 5-20 AHI reduced to 0.7 100% compliance for day and >4 hrs    THYROID NODULE Being followed by Dr. Honor Junes endocrinology    Obesity -recommend significant weight loss -recommend changing diet   CAD + diastolic dysfunction. 2/2 pulmonary hypertension ( high wedge).  She has a pacemaker  in place Following cardiology recs (Dr. Tresa Garter) For the pulm htn treating the underlying causes.    Deconditioned state -Recommend increased daily activity and exercise   MEDICATION ADJUSTMENTS/LABS AND TESTS ORDERED: Continue CPAP as prescribed Follow up cardiology as scheduled Follow up with Endocrinology  CURRENT MEDICATIONS REVIEWED AT LENGTH WITH PATIENT TODAY   Patient  satisfied with Plan of action and management. All questions answered  Follow-up in 1 year  Total time spent 34 minutes   Corrin Parker, M.D.  [facility]  Medical Director [place] Director [facility] Cardio-Pulmonary Department

## 2022-04-02 ENCOUNTER — Other Ambulatory Visit: Payer: Self-pay | Admitting: Internal Medicine

## 2022-04-02 DIAGNOSIS — Z1231 Encounter for screening mammogram for malignant neoplasm of breast: Secondary | ICD-10-CM

## 2022-04-30 ENCOUNTER — Ambulatory Visit
Admission: RE | Admit: 2022-04-30 | Discharge: 2022-04-30 | Disposition: A | Discharge status: Home / Self Care | Payer: Medicare Other | Source: Ambulatory Visit | Attending: Internal Medicine | Admitting: Internal Medicine

## 2022-04-30 DIAGNOSIS — Z1231 Encounter for screening mammogram for malignant neoplasm of breast: Secondary | ICD-10-CM | POA: Insufficient documentation

## 2022-05-30 ENCOUNTER — Ambulatory Visit (INDEPENDENT_AMBULATORY_CARE_PROVIDER_SITE_OTHER): Payer: Medicare Other

## 2022-05-30 DIAGNOSIS — I5032 Chronic diastolic (congestive) heart failure: Secondary | ICD-10-CM | POA: Diagnosis not present

## 2022-05-30 LAB — CUP PACEART REMOTE DEVICE CHECK
Battery Voltage: 80
Date Time Interrogation Session: 20240417065409
Implantable Lead Connection Status: 753985
Implantable Lead Connection Status: 753985
Implantable Lead Implant Date: 20210120
Implantable Lead Implant Date: 20210120
Implantable Lead Location: 753859
Implantable Lead Location: 753860
Implantable Lead Model: 5076
Implantable Lead Model: 5076
Implantable Pulse Generator Implant Date: 20210120
Pulse Gen Model: 407145
Pulse Gen Serial Number: 69765687

## 2022-06-01 ENCOUNTER — Telehealth: Payer: Self-pay | Admitting: Cardiovascular Disease

## 2022-06-01 ENCOUNTER — Emergency Department: Payer: Medicare Other

## 2022-06-01 ENCOUNTER — Other Ambulatory Visit: Payer: Self-pay

## 2022-06-01 ENCOUNTER — Emergency Department
Admission: EM | Admit: 2022-06-01 | Discharge: 2022-06-01 | Disposition: A | Discharge status: Home / Self Care | Payer: Medicare Other | Attending: Emergency Medicine | Admitting: Emergency Medicine

## 2022-06-01 DIAGNOSIS — K920 Hematemesis: Secondary | ICD-10-CM | POA: Diagnosis not present

## 2022-06-01 DIAGNOSIS — R1013 Epigastric pain: Secondary | ICD-10-CM | POA: Insufficient documentation

## 2022-06-01 DIAGNOSIS — D649 Anemia, unspecified: Secondary | ICD-10-CM | POA: Insufficient documentation

## 2022-06-01 DIAGNOSIS — I2510 Atherosclerotic heart disease of native coronary artery without angina pectoris: Secondary | ICD-10-CM | POA: Diagnosis not present

## 2022-06-01 DIAGNOSIS — E119 Type 2 diabetes mellitus without complications: Secondary | ICD-10-CM | POA: Diagnosis not present

## 2022-06-01 DIAGNOSIS — R55 Syncope and collapse: Secondary | ICD-10-CM | POA: Insufficient documentation

## 2022-06-01 DIAGNOSIS — I509 Heart failure, unspecified: Secondary | ICD-10-CM | POA: Insufficient documentation

## 2022-06-01 DIAGNOSIS — E876 Hypokalemia: Secondary | ICD-10-CM | POA: Diagnosis not present

## 2022-06-01 LAB — CBC
HCT: 39 % (ref 36.0–46.0)
Hemoglobin: 11.9 g/dL — ABNORMAL LOW (ref 12.0–15.0)
MCH: 24.4 pg — ABNORMAL LOW (ref 26.0–34.0)
MCHC: 30.5 g/dL (ref 30.0–36.0)
MCV: 79.9 fL — ABNORMAL LOW (ref 80.0–100.0)
Platelets: 270 10*3/uL (ref 150–400)
RBC: 4.88 MIL/uL (ref 3.87–5.11)
RDW: 17.2 % — ABNORMAL HIGH (ref 11.5–15.5)
WBC: 7.2 10*3/uL (ref 4.0–10.5)
nRBC: 0 % (ref 0.0–0.2)

## 2022-06-01 LAB — COMPREHENSIVE METABOLIC PANEL
ALT: 15 U/L (ref 0–44)
AST: 18 U/L (ref 15–41)
Albumin: 4.1 g/dL (ref 3.5–5.0)
Alkaline Phosphatase: 156 U/L — ABNORMAL HIGH (ref 38–126)
Anion gap: 15 (ref 5–15)
BUN: 28 mg/dL — ABNORMAL HIGH (ref 8–23)
CO2: 30 mmol/L (ref 22–32)
Calcium: 9.2 mg/dL (ref 8.9–10.3)
Chloride: 91 mmol/L — ABNORMAL LOW (ref 98–111)
Creatinine, Ser: 1.49 mg/dL — ABNORMAL HIGH (ref 0.44–1.00)
GFR, Estimated: 36 mL/min — ABNORMAL LOW (ref >60)
Glucose, Bld: 175 mg/dL — ABNORMAL HIGH (ref 70–99)
Potassium: 2.8 mmol/L — ABNORMAL LOW (ref 3.5–5.1)
Sodium: 136 mmol/L (ref 135–145)
Total Bilirubin: 0.8 mg/dL (ref 0.3–1.2)
Total Protein: 7.7 g/dL (ref 6.5–8.1)

## 2022-06-01 LAB — TYPE AND SCREEN
ABO/RH(D): A POS
Antibody Screen: NEGATIVE

## 2022-06-01 LAB — MAGNESIUM: Magnesium: 2.5 mg/dL — ABNORMAL HIGH (ref 1.7–2.4)

## 2022-06-01 LAB — LIPASE, BLOOD: Lipase: 36 U/L (ref 11–51)

## 2022-06-01 LAB — TROPONIN I (HIGH SENSITIVITY): Troponin I (High Sensitivity): 2 ng/L (ref <18)

## 2022-06-01 MED ORDER — ACETAMINOPHEN 500 MG PO TABS
1000.0000 mg | ORAL_TABLET | Freq: Once | ORAL | Status: AC
  Administered 2022-06-01: 1000 mg via ORAL
  Filled 2022-06-01: qty 2

## 2022-06-01 MED ORDER — PANTOPRAZOLE SODIUM 40 MG IV SOLR
40.0000 mg | Freq: Once | INTRAVENOUS | Status: AC
  Administered 2022-06-01: 40 mg via INTRAVENOUS
  Filled 2022-06-01: qty 10

## 2022-06-01 MED ORDER — POTASSIUM CHLORIDE CRYS ER 20 MEQ PO TBCR
40.0000 meq | EXTENDED_RELEASE_TABLET | Freq: Once | ORAL | Status: AC
  Administered 2022-06-01: 40 meq via ORAL
  Filled 2022-06-01: qty 2

## 2022-06-01 MED ORDER — ONDANSETRON HCL 4 MG/2ML IJ SOLN
4.0000 mg | Freq: Once | INTRAMUSCULAR | Status: AC
  Administered 2022-06-01: 4 mg via INTRAVENOUS
  Filled 2022-06-01: qty 2

## 2022-06-01 MED ORDER — POTASSIUM CHLORIDE 10 MEQ/100ML IV SOLN
10.0000 meq | Freq: Once | INTRAVENOUS | Status: AC
  Administered 2022-06-01: 10 meq via INTRAVENOUS
  Filled 2022-06-01: qty 100

## 2022-06-01 MED ORDER — SODIUM CHLORIDE 0.9 % IV BOLUS
250.0000 mL | Freq: Once | INTRAVENOUS | Status: AC
  Administered 2022-06-01: 250 mL via INTRAVENOUS

## 2022-06-01 NOTE — ED Provider Notes (Signed)
[facility] Provider Note    Event Date/Time   First MD Initiated Contact with Patient 06/01/22 1102     (approximate)   History   Hematemesis and Loss of Consciousness   HPI  [name] is a 77 y.o. female past medical history significant for CAD, diabetes, CHF, who presents to the emergency department following an episode of hematemesis.  Patient states that she feels like she was going to pass out for the past couple of days.  States that she had been having an increase of weight gain and had been increasing her fluid tablets as instructed by her cardiologist.  States that she has been taking for a day and then takes an extra dose of metolazone.  Over the past couple of days after taking her increased dose of metolazone states that she feels like she is going to pass out.  States that she when she goes to go from lying down to standing gets lightheaded and like she might pass out.  Denies any chest pain or shortness of breath.  Endorses compliance with all of her home medications.  1 episode of vomiting earlier today and noted some blood in her vomit.  States the blood was bright red.  No further episodes of bright red blood with emesis or vomiting.  No blood in her stool or melanotic stool.  Not on anticoagulation.  Endorses some mild epigastric abdominal pain when she threw up but denies any ongoing pain at this time.  Prior cholecystectomy.     Physical Exam   Triage Vital Signs: ED Triage Vitals [06/01/22 1024]  Enc Vitals Group     BP (!) 158/70     Pulse Rate 93     Resp 18     Temp 98.7 F (37.1 C)     Temp src      SpO2 98 %     Weight      Height      Head Circumference      Peak Flow      Pain Score 0     Pain Loc      Pain Edu?      Excl. in GC?     Most recent vital signs: Vitals:   06/01/22 1330 06/01/22 1500  BP: (!) 126/106 111/70  Pulse: 78 80  Resp: 14 16  Temp:    SpO2: 96%     Physical Exam Constitutional:       Appearance: She is well-developed.  HENT:     Head: Atraumatic.  Eyes:     Conjunctiva/sclera: Conjunctivae normal.  Cardiovascular:     Rate and Rhythm: Regular rhythm.  Pulmonary:     Effort: No respiratory distress.     Breath sounds: No wheezing.  Abdominal:     General: There is no distension.     Tenderness: There is no abdominal tenderness.  Genitourinary:    Comments: DRE w/o melena or gross blood. Musculoskeletal:        General: Normal range of motion.     Cervical back: Normal range of motion.  Skin:    General: Skin is warm.  Neurological:     Mental Status: She is alert. Mental status is at baseline.     IMPRESSION / MDM / ASSESSMENT AND PLAN / ED COURSE  I reviewed the triage vital signs and the nursing notes.  Differential diagnosis including orthostatic hypotension from diuretics, electrolyte abnormality, dehydration, ACS, upper GI bleed/gastritis, lower GI  bleed, Mallory-Weiss tear  EKG  I, Corena Herter, the attending physician, personally viewed and interpreted this ECG.   Rate: Normal  Rhythm: Normal sinus  Axis: Normal  Intervals: Normal  ST&T Change: None Nonspecific ST changes.  No significant change when compared to prior EKG  No tachycardic or bradycardic dysrhythmias while on cardiac telemetry.  RADIOLOGY I independently reviewed imaging, my interpretation of imaging: Chest x-ray no signs of pneumonia  LABS (all labs ordered are listed, but only abnormal results are displayed) Labs interpreted as -    Labs Reviewed  COMPREHENSIVE METABOLIC PANEL - Abnormal; Notable for the following components:      Result Value   Potassium 2.8 (*)    Chloride 91 (*)    Glucose, Bld 175 (*)    BUN 28 (*)    Creatinine, Ser 1.49 (*)    Alkaline Phosphatase 156 (*)    GFR, Estimated 36 (*)    All other components within normal limits  CBC - Abnormal; Notable for the following components:   Hemoglobin 11.9 (*)    MCV 79.9 (*)    MCH 24.4 (*)     RDW 17.2 (*)    All other components within normal limits  MAGNESIUM - Abnormal; Notable for the following components:   Magnesium 2.5 (*)    All other components within normal limits  LIPASE, BLOOD  POC OCCULT BLOOD, ED  TYPE AND SCREEN  TROPONIN I (HIGH SENSITIVITY)     MDM     Patient found to have creatinine that is at her baseline.  Hypokalemia with a potassium of 2.8.  Magnesium within normal limits.  No significant hyperglycemia.  No significant leukocytosis.  Anemia but her hemoglobin is stable and actually elevated when compared to prior at 11.9 compared to 10.2.  Normal platelets.  Has a history of high risk for heart score given her coronary artery disease, troponin is negative, have a low suspicion for ACS.  No chest pain at this time.  Orthostatic blood pressures are negative.  Patient was given IV and p.o. potassium repletion  On reevaluation patient continues to have no chest pain and no shortness of breath.  Ambulated in the emergency department without feeling like she is going to pass out.  Clinical picture concerning for orthostatic syncope from diuretics.  Given potassium repletion and discussed taking her potassium pills at home for the next couple of days.  Discussed holding her diuretics until she can follow-up on Monday or if she starts to gain weight and have shortness of breath.  Discussed close follow-up with her cardiologist.  Has appointment scheduled with her primary care physician next week.  Patient tolerating p.o.  No further episodes of hematemesis and no significant anemia.  Given IV Protonix in the emergency department.  Concern for possible Mallory-Weiss tear, no obvious signs of a significant upper GI bleed.  No history or concern for variceal bleed.  No NSAID use.  And not on anticoagulation.  Discussed close follow-up with primary care physician and given return precautions if she has any recurrent episodes at home.  Patient expressed understanding.   No questions at time of discharge.  PROCEDURES:  Critical Care performed: No  Procedures  Patient's presentation is most consistent with acute presentation with potential threat to life or bodily function.   MEDICATIONS ORDERED IN ED: Medications  acetaminophen (TYLENOL) tablet 1,000 mg (has no administration in time range)  potassium chloride 10 mEq in 100 mL IVPB (0 mEq Intravenous Stopped 06/01/22  1252)  sodium chloride 0.9 % bolus 250 mL (0 mLs Intravenous Stopped 06/01/22 1252)  ondansetron (ZOFRAN) injection 4 mg (4 mg Intravenous Given 06/01/22 1149)  pantoprazole (PROTONIX) injection 40 mg (40 mg Intravenous Given 06/01/22 1150)  potassium chloride SA (KLOR-CON M) CR tablet 40 mEq (40 mEq Oral Given 06/01/22 1318)    FINAL CLINICAL IMPRESSION(S) / ED DIAGNOSES   Final diagnoses:  Hematemesis with nausea  Syncope, unspecified syncope type     Rx / DC Orders   ED Discharge Orders          Ordered    Ambulatory referral to Cardiology       Comments: If you have not heard from the Cardiology office within the next 72 hours please call [phone].   06/01/22 1542             Note:  This document was prepared using Dragon voice recognition software and may include unintentional dictation errors.   Corena Herter, MD 06/01/22 [phone]

## 2022-06-01 NOTE — Discharge Instructions (Signed)
[name] for an episode of vomiting blood.  Your hemoglobin level was at your normal in the emergency department.  You have no signs of a significant GI bleed.  It is importantly follow-up closely with your primary care physician so they can recheck your hemoglobin level.  Your potassium level was low in the emergency department.  Concerned this is from your fluid pill.  You are given IV and oral replacement in the emergency department.  Take your potassium tablet at home for the next 3 days until you can follow-up with your primary care physician.  Hold your Lasix tomorrow and restart once a day on Sunday.  Return to the emergency department if you have any recurrent episodes of vomiting blood, passing out or chest pain.  Your potassium was mildly low when checked today.  Make sure to follow up with a primary doctor to follow up your labs.  Make sure to eat food high in potassium and magnesium - examples - potatoes, spinach, bananas, beans, avocadoes, oranges, nuts.

## 2022-06-01 NOTE — ED Triage Notes (Signed)
Pt to ED via POV from home. Pt sent by Dr. Hyacinth Meeker due to syncopal episode this morning and after vomited bright red blood. Pt denies blood thinners. Pt reports epigastric pain.

## 2022-06-01 NOTE — Telephone Encounter (Signed)
Pt c/o Syncope: STAT if syncope occurred within 30 minutes and pt complains of lightheadedness High Priority if episode of passing out, completely, today or in last 24 hours   Did you pass out today? Yes    When is the last time you passed out? This morning    Has this occurred multiple times? Yes    Did you have any symptoms prior to passing out? Patient was seen in hospital for this today and she was told to call to make sure the the medication she is on does not need to be changed.

## 2022-06-01 NOTE — Telephone Encounter (Signed)
Pt stated today she was evaluated in the ER due to 2 syncopal episodes and hematemesis. Pt stated she's been following torsemide and metolazone medication instructions as listed below. Pt stated on Wednesday she was 211 and stated she took torsemide 40 mg BID and one dose of metolazone. She reports current weight  203 She indicated since then, she had a syncopal episode on Thursday and today.  No vitals to report Pt reported during today's episode, she fell backwards, was very nauseated, and threw up blood. Due to symptoms pt stated she went to ER. Per ER visit, Pt's potassium was 2.8.  ER note indicated pt was given IV and PO potassium supplement.   Medication Instructions:  For weight <205, stay on torsemide 40 daily For weight >205 to 210, torsemide 40 in Am, extra torsemide 20 mg after lunch For weight >210, take torsemide 40 mg twice a day For weight >210 that does not come down, take metolazone    Pt reported this is the second time she's taking metolazone and it's caused her to pass out. Pt stated she feels the dose is too high and thinks medication need to be adjusted.   Will forward to MD for recommendation. Pt also made aware of ED precaution should any new symptoms develop or worsen.

## 2022-06-03 DIAGNOSIS — R55 Syncope and collapse: Secondary | ICD-10-CM | POA: Insufficient documentation

## 2022-06-03 DIAGNOSIS — I455 Other specified heart block: Secondary | ICD-10-CM | POA: Insufficient documentation

## 2022-06-03 NOTE — Progress Notes (Unsigned)
Cardiology Office Note Date:  06/05/2022  Patient ID:  [name] 1946/01/18, MRN [id number] PCP:  Danella Penton, MD  Cardiologist:  Julien Nordmann, MD Electrophysiologist: Sherryl Manges, MD    Chief Complaint: 1 year PPM follow-up, recent syncope  History of Present Illness: [name] is a 77 y.o. female with PMH notable for atach, syncope, SND s/p PPM, HTN, CAD, HFpEF, OSA; seen today for Sherryl Manges, MD for routine electrophysiology followup.  Last saw Dr. Graciela Husbands 03/2021, had presyncope similar to prior episodes. Fluid overloaded, inc torsemide to  daily, considered adding ezetimibe.  Has seen gen cards Dr. Mariah Milling, NP [facility], most recently 02/2022 Micah Flesher to ER last week for hematemesis, syncope. Weight was elevated, so she took torsemide BID + metolazone as instructed. In ER, found hypokalemic down to 2.8. BP stable.   Today, she is very concerned about her syncopal episodes. Has had no further episodes of emesis.  She has been taking her torsemide and metolazone as previously instructed by Dr. Mariah Milling. Fluid status is well-maintained with below regimen.  For weight <205, stay on torsemide 40 daily For weight >205 to 210, torsemide 40 in Am, extra torsemide 20 mg after lunch For weight >210, take torsemide 40 mg twice a day For weight >210 that does not come down, take metolazone  She has of potassium, and for every  of torsemide she takes a potassium supplement. If she takes metolazone, she also takes an extra potassium. Has been on this regime for awhile.  She is quite constipated, takes dulcolax daily.   Has PCP appt later this week for further syncope eval.   she denies chest pain, palpitations, dyspnea, PND, orthopnea, nausea, vomiting, dizziness, or early satiety.     Device Information: Biotronik dual chamber PPM, imp 02/2019; dx syncope MDT RA and RV leads   Past Medical History:  Diagnosis Date   Acquired elevated diaphragm    HIGHER ON  RIGHT SIDE   Anemia    Anxiety    Aortic atherosclerosis    Arthritis    CAD (coronary artery disease)    a. 07/2017 MV: Low risk; b. 09/2017 Cath: LM nl, LAD 50p/m, D1 80ost, LCX large/nl, RCA small/nl.   Chronic back pain    stenosis.degenerative disc,some scoliosis   Chronic heart failure with preserved ejection fraction (HFpEF)    a. 07/2017 Echo: EF 60-65%, no rwma, gr2 DD, nl RV fxn.   Chronic kidney disease    stage 3   Complication of anesthesia 2018   aspirated during wrist surgery during LMA removal   Constipation    takes Stool Softener daily   Coronary artery disease    Depression    takes Cymbalta daily   Diabetes mellitus    Type 2 diabetic. Average fasting blood sugar runs high 170-200   Diastolic CHF 07/17/2017   Per patient, diagnosed in 2018   Diverticulosis    Dysplastic nevus 09/12/2017   L sup buttock - mild    Dysplastic nevus 09/18/2018   R sup med buttocks - mild    E coli infection    GERD (gastroesophageal reflux disease)    takes Nexium daily   Grade II diastolic dysfunction    Headache    Hemorrhoids    History of colon polyps    benign   History of gout    doesn't take any meds   History of hiatal hernia    small   History of vertigo  doesn't take any meds   Hyperlipidemia    takes Praluent daily   Hypertension    currently BP medications are on hold    Hypothyroidism    takes Synthroid daily   Insomnia    takes Restoril nightly   Muscle spasm    takes Robaxin as needed   NSVT (nonsustained ventricular tachycardia)    OSA on CPAP    Paroxysmal atrial tachycardia    Peripheral vascular disease    AAA as stated per pt / was just discovered and pt states has not been referred to vascular MD    Presence of permanent cardiac pacemaker    Restless leg    takes Requip daily   Rosacea    Symptomatic bradycardia    a. 02/2019 s/p Biotronik [place]ora 8 DR-T DC PPM   Syncope    a. Documented bradycardia and pauses-->02/2019 s/p Biotronik  Edora 8 DR-T DC PPM.   Varicose veins     Past Surgical History:  Procedure Laterality Date   ABDOMINAL HYSTERECTOMY     with BSo   BACK SURGERY     CARDIAC CATHETERIZATION  2013   Normal   CARPAL TUNNEL RELEASE Bilateral    CATARACT EXTRACTION, BILATERAL     CHOLECYSTECTOMY     COLONOSCOPY     COLONOSCOPY WITH PROPOFOL N/A 11/25/2018   Procedure: COLONOSCOPY WITH PROPOFOL;  Surgeon: Toledo, Boykin Nearing, MD;  Location: [facility] ENDOSCOPY;  Service: Gastroenterology;  Laterality: N/A;   DIAGNOSTIC LAPAROSCOPY     multiple times   DILATION AND CURETTAGE OF UTERUS     ESOPHAGOGASTRODUODENOSCOPY (EGD) WITH PROPOFOL N/A 11/01/2014   Procedure: ESOPHAGOGASTRODUODENOSCOPY (EGD) WITH PROPOFOL;  Surgeon: Wallace Cullens, MD;  Location: [facility] ENDOSCOPY;  Service: Gastroenterology;  Laterality: N/A;   ESOPHAGOGASTRODUODENOSCOPY (EGD) WITH PROPOFOL N/A 11/25/2018   Procedure: ESOPHAGOGASTRODUODENOSCOPY (EGD) WITH PROPOFOL;  Surgeon: Toledo, Boykin Nearing, MD;  Location: [facility] ENDOSCOPY;  Service: Gastroenterology;  Laterality: N/A;   HARDWARE REMOVAL Left 10/11/2016   Procedure: HARDWARE REMOVAL-LEFT RADIUS;  Surgeon: Lyndle Herrlich, MD;  Location: [facility] ORS;  Service: Orthopedics;  Laterality: Left;  Left Radius Wrist    IMPLANTABLE CONTACT LENS IMPLANTATION     bilateral   KNEE ARTHROSCOPY Right 05/09/2020   Procedure: ARTHROSCOPY KNEE;  Surgeon: Donato Heinz, MD;  Location: [facility] ORS;  Service: Orthopedics;  Laterality: Right;   LAMINECTOMY  11/13/2015   LEFT HEART CATH AND CORONARY ANGIOGRAPHY Left 10/01/2017   Procedure: LEFT HEART CATH AND CORONARY ANGIOGRAPHY;  Surgeon: Yvonne Kendall, MD;  Location: [facility] INVASIVE CV LAB;  Service: Cardiovascular;  Laterality: Left;   LOOP RECORDER INSERTION N/A 10/17/2017   Procedure: LOOP RECORDER INSERTION;  Surgeon: Duke Salvia, MD;  Location: [facility] INVASIVE CV LAB;  Service: Cardiovascular;  Laterality: N/A;   LOOP RECORDER REMOVAL N/A 03/04/2019    Procedure: LOOP RECORDER REMOVAL;  Surgeon: Duke Salvia, MD;  Location: [facility] INVASIVE CV LAB;  Service: Cardiovascular;  Laterality: N/A;   LUMBAR FUSION  11/2015   LUMBAR WOUND DEBRIDEMENT N/A 12/02/2015   Procedure: WOUND Exploration;  Surgeon: Lisbeth Renshaw, MD;  Location: [facility] OR;  Service: Neurosurgery;  Laterality: N/A;   OPEN REDUCTION INTERNAL FIXATION (ORIF) DISTAL RADIAL FRACTURE Left 08/29/2016   Procedure: OPEN REDUCTION INTERNAL FIXATION (ORIF) DISTAL RADIAL FRACTURE;  Surgeon: Lyndle Herrlich, MD;  Location: [facility] ORS;  Service: Orthopedics;  Laterality: Left;   PACEMAKER IMPLANT N/A 03/04/2019   Procedure: PACEMAKER IMPLANT;  Surgeon: Duke Salvia, MD;  Location: [facility]  INVASIVE CV LAB;  Service: Cardiovascular;  Laterality: N/A;   PICC LINE PLACE PERIPHERAL ([facility] HX)     right upper arm    RIGHT/LEFT HEART CATH AND CORONARY ANGIOGRAPHY N/A 12/26/2021   Procedure: RIGHT/LEFT HEART CATH AND CORONARY ANGIOGRAPHY;  Surgeon: Yvonne Kendall, MD;  Location: [facility] INVASIVE CV LAB;  Service: Cardiovascular;  Laterality: N/A;   ROTATOR CUFF REPAIR Right    SAVORY DILATION N/A 11/01/2014   Procedure: SAVORY DILATION;  Surgeon: Wallace Cullens, MD;  Location: [facility] ENDOSCOPY;  Service: Gastroenterology;  Laterality: N/A;   TONSILLECTOMY     TRIGGER FINGER RELEASE Bilateral     Current Outpatient Medications  Medication Instructions   Acetaminophen (TYLENOL ARTHRITIS PAIN PO) 1,300 mg, Oral, Every 8 hours PRN   albuterol (PROVENTIL) 2.5 mg, Nebulization, Every 4 hours PRN   albuterol (VENTOLIN HFA) 108 (90 Base) MCG/ACT inhaler 1 puff, Inhalation, Every 4 hours PRN   aspirin 81 mg, Oral, Daily at bedtime   atenolol (TENORMIN) 25 mg, Oral, BH-each morning   Azelaic Acid (FINACEA) 15 % FOAM 1 application , Apply externally, As directed, Qd to bid to aa face   azelastine (ASTELIN) 0.1 % nasal spray 2 sprays, Each Nare, 2 times daily PRN   cholecalciferol (VITAMIN D3) 1,000 Units, Oral, Daily    diclofenac Sodium (VOLTAREN) 2 g, Topical, 2 times daily   empagliflozin (JARDIANCE) 10 mg, Oral, Daily   esomeprazole (NEXIUM) 40 mg, Oral, 2 times daily   gabapentin (NEURONTIN) 300 mg, Oral, 2 times daily   Insulin Aspart (NOVOLOG FLEXPEN [place]) 10 Units, Subcutaneous, As needed   Lantus SoloStar 28 Units, Subcutaneous, Every morning   levothyroxine (SYNTHROID) 75 mcg, Oral, Daily before breakfast   methocarbamol (ROBAXIN) 500 mg, Oral, Every 8 hours PRN   metolazone (ZAROXOLYN) 2.5 mg, Oral, Daily PRN   mometasone (ELOCON) 0.1 % cream 1 application , Topical, Daily PRN, Qd to bid up to 5 days a week aa right lower leg prn itching   nitroGLYCERIN (NITROSTAT) 0.4 mg, Sublingual, Every 5 min PRN   ondansetron (ZOFRAN) 4 mg, Oral, Every 8 hours PRN   ondansetron (ZOFRAN-ODT) 4 mg, Oral, Every 6 hours PRN   potassium chloride (KLOR-CON) 10 MEQ tablet Take 2 tablet (20 meq) by mouth twice daily as directed   PRALUENT 150 MG/ML SOAJ INJECT 150 MG UNDER THE SKIN EVERY 14 DAYS   ranolazine (RANEXA) 1,000 mg, Oral, 2 times daily   ropinirole (REQUIP) 5 mg, Oral, Daily at bedtime   temazepam (RESTORIL) 30 mg, Oral, Daily at bedtime   torsemide (DEMADEX) 40 mg, Oral, 2 times daily, As directed   traMADol (ULTRAM) 50 mg, Oral, Every 6 hours PRN   trolamine salicylate (ASPERCREME) 10 % cream 1 application , Topical, Daily at bedtime    Social History:  The patient  reports that she has never smoked. She has never used smokeless tobacco. She reports that she does not drink alcohol and does not use drugs.   Family History:  The patient's family history includes Breast cancer (age of onset: 26) in her mother; Heart attack in her sister; Heart disease in her brother, father, mother, and sister.  ROS:  Please see the history of present illness. All other systems are reviewed and otherwise negative.   PHYSICAL EXAM:  VS:  BP (!) 157/75 (BP Location: Left Arm, Patient Position: Sitting, Cuff Size:  Large)   Pulse 76   Ht 5\' 1"  (1.549 m)   Wt 207 lb (93.9 kg)  SpO2 98%   BMI 39.11 kg/m  BMI: Body mass index is 39.11 kg/m.  Vitals:   06/05/22 1019  BP: (!) 157/75  Pulse: 76  Height:  (1.549 m)  Weight: 207 lb (93.9 kg)  SpO2: 98%  BMI (Calculated): 39.13     GEN- The patient is well appearing, alert and oriented x 3 today.   Lungs- Clear to ausculation bilaterally, normal work of breathing.  Heart- Regular rate and rhythm, no murmurs, rubs or gallops Extremities- 1+ peripheral edema, warm, dry Skin-  device pocket well-healed     Device interrogation done today and reviewed by myself:  Battery good Lead thresholds, impedence, sensing stable  Single PVCs when running threshold in DDD No PVCs when running threshold in VVI  One brief episode of NSVT in July 2023  No changes made today  EKG is ordered. Personal review of EKG from today shows:  A-paced, rate 76bpm  Recent Labs: 12/24/2021: B Natriuretic Peptide 21.3 06/01/2022: ALT 15; Magnesium 2.5 06/05/2022: BUN 26; Creatinine, Ser 1.39; Hemoglobin 10.9; Platelets 249; Potassium 3.5; Sodium 138  No results found for requested labs within last 365 days.   Estimated Creatinine Clearance: 36 mL/min (A) (by C-G formula based on SCr of 1.39 mg/dL (H)).   Wt Readings from Last 3 Encounters:  06/05/22 207 lb (93.9 kg)  03/27/22 209 lb (94.8 kg)  02/27/22 212 lb 4 oz (96.3 kg)     Additional studies reviewed include: Previous EP, cardiology notes.   R/LHC, 12/2021 Stable appearance of coronary arteries with severe stenosis involving proximal portion of small D1 branch and otherwise mild-moderate, non-obstructive disease. Mildly elevated left heart filling pressures. Moderately-severely elevated right heart filling pressures. Normal Fick cardiac output/index.  TTE, 12/26/2021  1. Left ventricular ejection fraction, by estimation, is 55 to 60%. The left ventricle has normal function. The left ventricle has  no regional wall motion abnormalities. There is mild left ventricular hypertrophy. Left ventricular diastolic parameters are consistent with Grade II diastolic dysfunction (pseudonormalization). Elevated left atrial pressure.   2. Right ventricular systolic function is normal. The right ventricular size is normal.   3. The mitral valve is degenerative. Mild mitral valve regurgitation. No  evidence of mitral stenosis.   4. The aortic valve is tricuspid. There is mild calcification of the aortic valve. There is mild thickening of the aortic valve. Aortic valve regurgitation is not visualized. Aortic valve sclerosis/calcification is present, without any evidence of aortic stenosis.    ASSESSMENT AND PLAN:  #) SND s/p Biotronk dual chamber PPM Device functioning well, see paceart for details No arrhythmias noted on device to correlate with recent syncopal episodes   #) HFpEF #) hypokalemia  Warm extremities, Some lower extremity edema, but patient states this is "Good" for her Cont diuretic regime as previously discussed Discussed potassium-rich foods - updated labs today   #) CAD No ischemic s/s   Current medicines are reviewed at length with the patient today.   The patient has concerns regarding her medicines.  The following changes were made today:  none  Labs/ tests ordered today include:  Orders Placed This Encounter  Procedures   CBC   Basic Metabolic Panel (BMET)   EKG 12-Lead     Disposition: Follow up with Dr. Graciela Husbands or EP APP in 12 months   Signed, Sherie Don, NP  06/05/22  12:22 PM  Electrophysiology [facility]

## 2022-06-04 NOTE — Telephone Encounter (Signed)
Returned the call to the patient. She stated that she normally takes 40 mg of Torsemide daily (2 tablets of the 20 mg tablets) and then 20 mEq of potassium (2 of the 10 mEq tablets) daily. She does a 1:1 ratio with the Torsemide and Potassium. If she takes an extra Torsemide then she takes an extra Potassium.   She took 40 mg of Torsemide this morning for a weight of 205 lbs and will take an extra 10 mEq of potassium since her potassium level was low.   She is wanting to know how much of the potassium she should be taking on a daily basis since she has not been taking 20 mEq bid.   She has an appointment tomorrow with NP for a PPM check.     Antonieta Iba, MD  [facility] [place] hours ago (11:00 AM)    Cause of her symptoms little bit unclear To the workup in the ER, blood pressure was stable, orthostasis negative, blood count fine Blood in the stomach and vomiting can cause one to pass out cause of that unclear.   unrelated to her diuretics  Would recommend increasing potassium up to 30 mill equivalents twice a day Up to her if she would like to continue metolazone as needed for high weight Seems to be effective at getting her weight down If she does take metolazone, would need additional potassium 20 Could raise the cutoff for the metolazone up to 213 from 210 pounds  Thx TG

## 2022-06-04 NOTE — Telephone Encounter (Signed)
Pt made aware of MD's recommendations and verbalized understanding.    Antonieta Iba, MD  [facility] T[facility]21 minutes ago (1:21 PM)    Would recommend for every torsemide 20 mg pill we take potassium 20 mill equivalents Also take extra 20 with metolazone Thx TGollan

## 2022-06-05 ENCOUNTER — Other Ambulatory Visit
Admission: RE | Admit: 2022-06-05 | Discharge: 2022-06-05 | Disposition: A | Discharge status: Home / Self Care | Payer: Medicare Other | Source: Ambulatory Visit | Attending: Cardiology | Admitting: Cardiology

## 2022-06-05 ENCOUNTER — Ambulatory Visit: Payer: Medicare Other | Attending: Cardiology | Admitting: Cardiology

## 2022-06-05 ENCOUNTER — Encounter: Payer: Self-pay | Admitting: Cardiology

## 2022-06-05 VITALS — BP 157/75 | HR 76 | Ht 61.0 in | Wt 207.0 lb

## 2022-06-05 DIAGNOSIS — Z950 Presence of cardiac pacemaker: Secondary | ICD-10-CM | POA: Diagnosis present

## 2022-06-05 DIAGNOSIS — I5032 Chronic diastolic (congestive) heart failure: Secondary | ICD-10-CM | POA: Diagnosis present

## 2022-06-05 DIAGNOSIS — I455 Other specified heart block: Secondary | ICD-10-CM

## 2022-06-05 DIAGNOSIS — R55 Syncope and collapse: Secondary | ICD-10-CM | POA: Diagnosis not present

## 2022-06-05 LAB — CUP PACEART INCLINIC DEVICE CHECK
Date Time Interrogation Session: 20240423124100
Implantable Lead Connection Status: 753985
Implantable Lead Connection Status: 753985
Implantable Lead Implant Date: 20210120
Implantable Lead Implant Date: 20210120
Implantable Lead Location: 753859
Implantable Lead Location: 753860
Implantable Lead Model: 5076
Implantable Lead Model: 5076
Implantable Pulse Generator Implant Date: 20210120
Pulse Gen Model: 407145
Pulse Gen Serial Number: 69765687

## 2022-06-05 LAB — CBC
HCT: 37 % (ref 36.0–46.0)
Hemoglobin: 10.9 g/dL — ABNORMAL LOW (ref 12.0–15.0)
MCH: 23.8 pg — ABNORMAL LOW (ref 26.0–34.0)
MCHC: 29.5 g/dL — ABNORMAL LOW (ref 30.0–36.0)
MCV: 80.8 fL (ref 80.0–100.0)
Platelets: 249 10*3/uL (ref 150–400)
RBC: 4.58 MIL/uL (ref 3.87–5.11)
RDW: 17.3 % — ABNORMAL HIGH (ref 11.5–15.5)
WBC: 6.9 10*3/uL (ref 4.0–10.5)
nRBC: 0 % (ref 0.0–0.2)

## 2022-06-05 LAB — BASIC METABOLIC PANEL
Anion gap: 11 (ref 5–15)
BUN: 26 mg/dL — ABNORMAL HIGH (ref 8–23)
CO2: 30 mmol/L (ref 22–32)
Calcium: 9.1 mg/dL (ref 8.9–10.3)
Chloride: 97 mmol/L — ABNORMAL LOW (ref 98–111)
Creatinine, Ser: 1.39 mg/dL — ABNORMAL HIGH (ref 0.44–1.00)
GFR, Estimated: 39 mL/min — ABNORMAL LOW (ref >60)
Glucose, Bld: 177 mg/dL — ABNORMAL HIGH (ref 70–99)
Potassium: 3.5 mmol/L (ref 3.5–5.1)
Sodium: 138 mmol/L (ref 135–145)

## 2022-06-05 NOTE — Patient Instructions (Addendum)
Medication Instructions:  Your physician recommends that you continue on your current medications as directed. Please refer to the Current Medication list given to you today.  *If you need a refill on your cardiac medications before your next appointment, please call your pharmacy*   Lab Work: CBC and BMP - Please go to the [facility]. You will check in at the front desk to the right as you walk into the atrium. Valet Parking is offered if needed. - No appointment needed. You may go any day between 7 am and 6 pm.  If you have labs (blood work) drawn today and your tests are completely normal, you will receive your results only by: MyChart Message (if you have MyChart) OR A paper copy in the mail If you have any lab test that is abnormal or we need to change your treatment, we will call you to review the results.   Testing/Procedures: No testing ordered  Follow-Up: At [facility], you and your health needs are our priority.  As part of our continuing mission to provide you with exceptional heart care, we have created designated Provider Care Teams.  These Care Teams include your primary Cardiologist (physician) and Advanced Practice Providers (APPs -  Physician Assistants and Nurse Practitioners) who all work together to provide you with the care you need, when you need it.  We recommend signing up for the patient portal called "MyChart".  Sign up information is provided on this After Visit Summary.  MyChart is used to connect with patients for Virtual Visits (Telemedicine).  Patients are able to view lab/test results, encounter notes, upcoming appointments, etc.  Non-urgent messages can be sent to your provider as well.   To learn more about what you can do with MyChart, go to [email or web address].    Your next appointment:   1 year  Provider:   Sherryl Manges, MD or Sherie Don, NP

## 2022-06-12 ENCOUNTER — Telehealth: Payer: Self-pay | Admitting: Cardiovascular Disease

## 2022-06-12 NOTE — Telephone Encounter (Signed)
I reviewed patient's remote device transmission reviewed by Dr. Graciela Husbands and do not appreciate any afib.  There is no indication for her to be on an anticoagulant due to AFib.  She should continue 81mg  asa for CAD

## 2022-06-12 NOTE — Telephone Encounter (Signed)
Please review patient concern. Thank you!

## 2022-06-12 NOTE — Telephone Encounter (Signed)
Pt c/o medication issue:  1. Name of Medication:   aspirin 81 MG tablet   2. How are you currently taking this medication (dosage and times per day)?   As prescribed  3. Are you having a reaction (difficulty breathing--STAT)?   No  4. What is your medication issue?   Patient stated she was recently diagnosed with onset afib.  Patient wants to know if this medication will be sufficient to address her new diagnosis.

## 2022-06-12 NOTE — Telephone Encounter (Signed)
Spoke with patient and informed her to continue aspirin 81 mg as directed for CAD and advised her that per [employer], A Fib did not show up on her transmission. Patient expressed gratitude for the call.

## 2022-06-15 ENCOUNTER — Telehealth: Payer: Self-pay | Admitting: Cardiovascular Disease

## 2022-06-15 NOTE — Telephone Encounter (Signed)
Spoke with patient.  She had a brief syncopal event on 06/13/22.  Reviewed transmission from today.  Everything is normal with device, normal function.  No arrhythmias/episodes or other abnormalities noted to correlate with event.   Patient is to follow up with gen cards and PCP.  Has a carotid doppler scheduled for further evaluation.

## 2022-06-15 NOTE — Telephone Encounter (Signed)
Pt c/o Syncope: STAT if syncope occurred within 30 minutes and pt complains of lightheadedness High Priority if episode of passing out, completely, today or in last 24 hours   Did you pass out today?   No   When is the last time you passed out?   Passed out on 5/1   Has this occurred multiple times?  Yes   Did you have any symptoms prior to passing out?     Patient wants to check if her episode of passing out registered on her pacemaker.

## 2022-06-15 NOTE — Telephone Encounter (Signed)
Routing to device clinic team prior to calling patient regarding any recent transmissions.

## 2022-07-02 NOTE — Progress Notes (Signed)
Remote pacemaker transmission.   

## 2022-07-04 ENCOUNTER — Ambulatory Visit (INDEPENDENT_AMBULATORY_CARE_PROVIDER_SITE_OTHER): Payer: Medicare Other | Admitting: Dermatology

## 2022-07-04 VITALS — BP 128/61 | HR 76

## 2022-07-04 DIAGNOSIS — W908XXA Exposure to other nonionizing radiation, initial encounter: Secondary | ICD-10-CM

## 2022-07-04 DIAGNOSIS — L578 Other skin changes due to chronic exposure to nonionizing radiation: Secondary | ICD-10-CM

## 2022-07-04 DIAGNOSIS — D692 Other nonthrombocytopenic purpura: Secondary | ICD-10-CM

## 2022-07-04 DIAGNOSIS — L2089 Other atopic dermatitis: Secondary | ICD-10-CM

## 2022-07-04 DIAGNOSIS — D1801 Hemangioma of skin and subcutaneous tissue: Secondary | ICD-10-CM

## 2022-07-04 DIAGNOSIS — Q828 Other specified congenital malformations of skin: Secondary | ICD-10-CM

## 2022-07-04 DIAGNOSIS — X32XXXA Exposure to sunlight, initial encounter: Secondary | ICD-10-CM

## 2022-07-04 DIAGNOSIS — L821 Other seborrheic keratosis: Secondary | ICD-10-CM

## 2022-07-04 DIAGNOSIS — L209 Atopic dermatitis, unspecified: Secondary | ICD-10-CM

## 2022-07-04 DIAGNOSIS — D229 Melanocytic nevi, unspecified: Secondary | ICD-10-CM

## 2022-07-04 DIAGNOSIS — Z7189 Other specified counseling: Secondary | ICD-10-CM

## 2022-07-04 DIAGNOSIS — L814 Other melanin hyperpigmentation: Secondary | ICD-10-CM

## 2022-07-04 DIAGNOSIS — L719 Rosacea, unspecified: Secondary | ICD-10-CM

## 2022-07-04 DIAGNOSIS — Z79899 Other long term (current) drug therapy: Secondary | ICD-10-CM

## 2022-07-04 DIAGNOSIS — Z1283 Encounter for screening for malignant neoplasm of skin: Secondary | ICD-10-CM

## 2022-07-04 DIAGNOSIS — Z86018 Personal history of other benign neoplasm: Secondary | ICD-10-CM

## 2022-07-04 MED ORDER — TRIAMCINOLONE ACETONIDE 0.1 % EX CREA: TOPICAL_CREAM | CUTANEOUS | 1 refills | Status: DC

## 2022-07-04 MED ORDER — DOXYCYCLINE 40 MG PO CPDR: 40.0000 mg | DELAYED_RELEASE_CAPSULE | ORAL | 2 refills | Status: DC

## 2022-07-04 NOTE — Patient Instructions (Addendum)
Doxycycline should be taken with food to prevent nausea. Do not lay down for 30 minutes after taking. Be cautious with sun exposure and use good sun protection while on this medication. Pregnant women should not take this medication.   Due to recent changes in healthcare laws, you may see results of your pathology and/or laboratory studies on MyChart before the doctors have had a chance to review them. We understand that in some cases there may be results that are confusing or concerning to you. Please understand that not all results are received at the same time and often the doctors may need to interpret multiple results in order to provide you with the best plan of care or course of treatment. Therefore, we ask that you please give us 2 business days to thoroughly review all your results before contacting the office for clarification. Should we see a critical lab result, you will be contacted sooner.   If You Need Anything After Your Visit  If you have any questions or concerns for your doctor, please call our main line at [phone] and press option 4 to reach your doctor's medical assistant. If no one answers, please leave a voicemail as directed and we will return your call as soon as possible. Messages left after 4 pm will be answered the following business day.   You may also send us a message via MyChart. We typically respond to MyChart messages within 1-2 business days.  For prescription refills, please ask your pharmacy to contact our office. Our fax number is [phone].  If you have an urgent issue when the clinic is closed that cannot wait until the next business day, you can page your doctor at the number below.    Please note that while we do our best to be available for urgent issues outside of office hours, we are not available 24/7.   If you have an urgent issue and are unable to reach us, you may choose to seek medical care at your doctor's office, retail clinic, urgent care  center, or emergency room.  If you have a medical emergency, please immediately call 911 or go to the emergency department.  Pager Numbers  - Dr. Kowalski: [phone]  - Dr. Moye: [phone]  - Dr. Stewart: [phone]  In the event of inclement weather, please call our main line at [phone] for an update on the status of any delays or closures.  Dermatology Medication Tips: Please keep the boxes that topical medications come in in order to help keep track of the instructions about where and how to use these. Pharmacies typically print the medication instructions only on the boxes and not directly on the medication tubes.   If your medication is too expensive, please contact our office at [phone] option 4 or send us a message through MyChart.   We are unable to tell what your co-pay for medications will be in advance as this is different depending on your insurance coverage. However, we may be able to find a substitute medication at lower cost or fill out paperwork to get insurance to cover a needed medication.   If a prior authorization is required to get your medication covered by your insurance company, please allow us 1-2 business days to complete this process.  Drug prices often vary depending on where the prescription is filled and some pharmacies may offer cheaper prices.  The website www.goodrx.com contains coupons for medications through different pharmacies. The prices here do not account for what the   cost may be with help from insurance (it may be cheaper with your insurance), but the website can give you the price if you did not use any insurance.  - You can print the associated coupon and take it with your prescription to the pharmacy.  - You may also stop by our office during regular business hours and pick up a GoodRx coupon card.  - If you need your prescription sent electronically to a different pharmacy, notify our office through [facility] MyChart or by  phone at [phone] option 4.     Si Usted Necesita Algo Despus de Su Visita  Tambin puede enviarnos un mensaje a travs de MyChart. Por lo general respondemos a los mensajes de MyChart en el transcurso de 1 a 2 das hbiles.  Para renovar recetas, por favor pida a su farmacia que se ponga en contacto con nuestra oficina. Nuestro nmero de fax es el [phone].  Si tiene un asunto urgente cuando la clnica est cerrada y que no puede esperar hasta el siguiente da hbil, puede llamar/localizar a su doctor(a) al nmero que aparece a continuacin.   Por favor, tenga en cuenta que aunque hacemos todo lo posible para estar disponibles para asuntos urgentes fuera del horario de oficina, no estamos disponibles las 24 horas del da, los 7 das de la semana.   Si tiene un problema urgente y no puede comunicarse con nosotros, puede optar por buscar atencin mdica  en el consultorio de su doctor(a), en una clnica privada, en un centro de atencin urgente o en una sala de emergencias.  Si tiene una emergencia mdica, por favor llame inmediatamente al 911 o vaya a la sala de emergencias.  Nmeros de bper  - Dr. Kowalski: [phone]  - Dra. Moye: [phone]  - Dra. Stewart: [phone]  En caso de inclemencias del tiempo, por favor llame a nuestra lnea principal al [phone] para una actualizacin sobre el estado de cualquier retraso o cierre.  Consejos para la medicacin en dermatologa: Por favor, guarde las cajas en las que vienen los medicamentos de uso tpico para ayudarle a seguir las instrucciones sobre dnde y cmo usarlos. Las farmacias generalmente imprimen las instrucciones del medicamento slo en las cajas y no directamente en los tubos del medicamento.   Si su medicamento es muy caro, por favor, pngase en contacto con nuestra oficina llamando al [phone] y presione la opcin 4 o envenos un mensaje a travs de MyChart.   No podemos decirle cul ser su copago  por los medicamentos por adelantado ya que esto es diferente dependiendo de la cobertura de su seguro. Sin embargo, es posible que podamos encontrar un medicamento sustituto a menor costo o llenar un formulario para que el seguro cubra el medicamento que se considera necesario.   Si se requiere una autorizacin previa para que su compaa de seguros cubra su medicamento, por favor permtanos de 1 a 2 das hbiles para completar este proceso.  Los precios de los medicamentos varan con frecuencia dependiendo del lugar de dnde se surte la receta y alguna farmacias pueden ofrecer precios ms baratos.  El sitio web www.goodrx.com tiene cupones para medicamentos de diferentes farmacias. Los precios aqu no tienen en cuenta lo que podra costar con la ayuda del seguro (puede ser ms barato con su seguro), pero el sitio web puede darle el precio si no utiliz ningn seguro.  - Puede imprimir el cupn correspondiente y llevarlo con su receta a la farmacia.  - Tambin puede pasar por   nuestra oficina durante el horario de atencin regular y recoger una tarjeta de cupones de GoodRx.  - Si necesita que su receta se enve electrnicamente a una farmacia diferente, informe a nuestra oficina a travs de MyChart de [place] o por telfono llamando al [phone] y presione la opcin 4.  

## 2022-07-04 NOTE — Progress Notes (Signed)
Follow-Up Visit   Subjective  [name] is a 77 y.o. female who presents for the following: Skin Cancer Screening and Full Body Skin Exam, hx of Dysplastic nevus. Rosacea on her face using Finacea foam with a poor response, sometimes she will break out with boils on her face. The patient presents for Total-Body Skin Exam (TBSE) for skin cancer screening and mole check. The patient has spots, moles and lesions to be evaluated, some may be new or changing and the patient has concerns that these could be cancer.  The following portions of the chart were reviewed this encounter and updated as appropriate: medications, allergies, medical history  Review of Systems:  No other skin or systemic complaints except as noted in HPI or Assessment and Plan.  Objective  Well appearing patient in no apparent distress; mood and affect are within normal limits.  A full examination was performed including scalp, head, eyes, ears, nose, lips, neck, chest, axillae, abdomen, back, buttocks, bilateral upper extremities, bilateral lower extremities, hands, feet, fingers, toes, fingernails, and toenails. All findings within normal limits unless otherwise noted below.   Relevant physical exam findings are noted in the Assessment and Plan.   Assessment & Plan   LENTIGINES, SEBORRHEIC KERATOSES, HEMANGIOMAS - Benign normal skin lesions - Benign-appearing - Call for any changes  MELANOCYTIC NEVI - Tan-brown and/or pink-flesh-colored symmetric macules and papules - Benign appearing on exam today - Observation - Call clinic for new or changing moles - Recommend daily use of broad spectrum spf 30+ sunscreen to sun-exposed areas.   ACTINIC DAMAGE - Chronic condition, secondary to cumulative UV/sun exposure - diffuse scaly erythematous macules with underlying dyspigmentation - Recommend daily broad spectrum sunscreen SPF 30+ to sun-exposed areas, reapply every 2 hours as needed.  - Staying in the shade or  wearing long sleeves, sun glasses (UVA+UVB protection) and wide brim hats (4-inch brim around the entire circumference of the hat) are also recommended for sun protection.  - Call for new or changing lesions.  Rosacea Face with several pink papules and erythema, hx of boils of the face   Rosacea is a chronic progressive skin condition usually affecting the face of adults, causing redness and/or acne bumps. It is treatable but not curable. It sometimes affects the eyes (ocular rosacea) as well. It may respond to topical and/or systemic medication and can flare with stress, sun exposure, alcohol, exercise and some foods.  Daily application of broad spectrum spf 30+ sunscreen to face is recommended to reduce flares.  Continue Finacea foam daily   Start Doxycycline 40 mg take 1 tablet once a day with food   Doxycycline should be taken with food to prevent nausea. Do not lay down for 30 minutes after taking. Be cautious with sun exposure and use good sun protection while on this medication. Pregnant women should not take this medication.    Purpura - Chronic; persistent and recurrent.  Treatable, but not curable. - Violaceous macules and patches - Benign - Related to trauma, age, sun damage and/or use of blood thinners, chronic use of topical and/or oral steroids - Observe - Can use OTC arnica containing moisturizer such as Dermend Bruise Formula if desired - Call for worsening or other concerns   Porokeratosis L mid pretibial- patches with elevated rims Benign appearing, observe for changes.  ATOPIC DERMATITIS Exam: mild pink patches  .Chronic and persistent condition with duration or expected duration over one year. Condition is symptomatic/ bothersome to patient. Not currently at goal.  Atopic dermatitis (eczema) is a chronic, relapsing, pruritic condition that can significantly affect quality of life. It is often associated with allergic rhinitis and/or asthma and can require treatment  with topical medications, phototherapy, or in severe cases biologic injectable medication (Dupixent; Adbry) or Oral JAK inhibitors.  Treatment Plan: Re start Triamcinolone cream apply to affected skin qd-bid prn itch, Avoid applying to face, groin, and axilla. Use as directed. Long-term use can cause thinning of the skin.   Topical steroids (such as triamcinolone, fluocinolone, fluocinonide, mometasone, clobetasol, halobetasol, betamethasone, hydrocortisone) can cause thinning and lightening of the skin if they are used for too long in the same area. Your physician has selected the right strength medicine for your problem and area affected on the body. Please use your medication only as directed by your physician to prevent side effects.    Recommend gentle skin care.    HISTORY OF DYSPLASTIC NEVUS Multiple see history No evidence of recurrence today Recommend regular full body skin exams Recommend daily broad spectrum sunscreen SPF 30+ to sun-exposed areas, reapply every 2 hours as needed.  Call if any new or changing lesions are noted between office visits   SKIN CANCER SCREENING PERFORMED TODAY.   Return in about 4 months (around 11/04/2022) for Rosacea .  IAngelique Holm, CMA, am acting as scribe for Armida Sans, MD .   Documentation: I have reviewed the above documentation for accuracy and completeness, and I agree with the above.  Armida Sans, MD

## 2022-07-10 ENCOUNTER — Encounter: Payer: Self-pay | Admitting: Internal Medicine

## 2022-07-11 ENCOUNTER — Ambulatory Visit
Admission: RE | Admit: 2022-07-11 | Discharge: 2022-07-11 | Disposition: A | Discharge status: Home / Self Care | Payer: Medicare Other | Attending: Internal Medicine | Admitting: Internal Medicine

## 2022-07-11 ENCOUNTER — Ambulatory Visit: Payer: Medicare Other | Admitting: Anesthesiology

## 2022-07-11 ENCOUNTER — Encounter
Admission: RE | Disposition: A | Discharge status: Home / Self Care | Payer: Self-pay | Source: Home / Self Care | Attending: Internal Medicine

## 2022-07-11 DIAGNOSIS — K449 Diaphragmatic hernia without obstruction or gangrene: Secondary | ICD-10-CM | POA: Insufficient documentation

## 2022-07-11 DIAGNOSIS — K5730 Diverticulosis of large intestine without perforation or abscess without bleeding: Secondary | ICD-10-CM | POA: Insufficient documentation

## 2022-07-11 DIAGNOSIS — E1122 Type 2 diabetes mellitus with diabetic chronic kidney disease: Secondary | ICD-10-CM | POA: Insufficient documentation

## 2022-07-11 DIAGNOSIS — K317 Polyp of stomach and duodenum: Secondary | ICD-10-CM | POA: Insufficient documentation

## 2022-07-11 DIAGNOSIS — N183 Chronic kidney disease, stage 3 unspecified: Secondary | ICD-10-CM | POA: Diagnosis not present

## 2022-07-11 DIAGNOSIS — E876 Hypokalemia: Secondary | ICD-10-CM | POA: Insufficient documentation

## 2022-07-11 DIAGNOSIS — R1314 Dysphagia, pharyngoesophageal phase: Secondary | ICD-10-CM | POA: Insufficient documentation

## 2022-07-11 DIAGNOSIS — R1310 Dysphagia, unspecified: Secondary | ICD-10-CM | POA: Insufficient documentation

## 2022-07-11 DIAGNOSIS — I5032 Chronic diastolic (congestive) heart failure: Secondary | ICD-10-CM | POA: Insufficient documentation

## 2022-07-11 DIAGNOSIS — K222 Esophageal obstruction: Secondary | ICD-10-CM | POA: Diagnosis not present

## 2022-07-11 DIAGNOSIS — K2100 Gastro-esophageal reflux disease with esophagitis, without bleeding: Secondary | ICD-10-CM | POA: Diagnosis not present

## 2022-07-11 DIAGNOSIS — I130 Hypertensive heart and chronic kidney disease with heart failure and stage 1 through stage 4 chronic kidney disease, or unspecified chronic kidney disease: Secondary | ICD-10-CM | POA: Insufficient documentation

## 2022-07-11 DIAGNOSIS — Z79899 Other long term (current) drug therapy: Secondary | ICD-10-CM | POA: Diagnosis not present

## 2022-07-11 DIAGNOSIS — K920 Hematemesis: Secondary | ICD-10-CM | POA: Insufficient documentation

## 2022-07-11 DIAGNOSIS — K2970 Gastritis, unspecified, without bleeding: Secondary | ICD-10-CM | POA: Diagnosis not present

## 2022-07-11 HISTORY — PX: ESOPHAGOGASTRODUODENOSCOPY (EGD) WITH PROPOFOL: SHX5813

## 2022-07-11 LAB — GLUCOSE, CAPILLARY: Glucose-Capillary: 164 mg/dL — ABNORMAL HIGH (ref 70–99)

## 2022-07-11 SURGERY — ESOPHAGOGASTRODUODENOSCOPY (EGD) WITH PROPOFOL
Anesthesia: General

## 2022-07-11 MED ORDER — ONDANSETRON HCL 4 MG/2ML IJ SOLN
INTRAMUSCULAR | Status: DC | PRN
  Administered 2022-07-11: 4 mg via INTRAVENOUS

## 2022-07-11 MED ORDER — SODIUM CHLORIDE 0.45 % IV SOLN: INTRAVENOUS | Status: DC | PRN

## 2022-07-11 MED ORDER — PROPOFOL 1000 MG/100ML IV EMUL
INTRAVENOUS | Status: AC
  Filled 2022-07-11: qty 100

## 2022-07-11 MED ORDER — SODIUM CHLORIDE 0.9 % IV SOLN
INTRAVENOUS | Status: DC
  Administered 2022-07-11: 20 mL/h via INTRAVENOUS

## 2022-07-11 MED ORDER — GLYCOPYRROLATE 0.2 MG/ML IJ SOLN
INTRAMUSCULAR | Status: DC | PRN
  Administered 2022-07-11: .2 mg via INTRAVENOUS

## 2022-07-11 MED ORDER — PROPOFOL 500 MG/50ML IV EMUL
INTRAVENOUS | Status: DC | PRN
  Administered 2022-07-11: 50 mg via INTRAVENOUS
  Administered 2022-07-11: 120 ug/kg/min via INTRAVENOUS

## 2022-07-11 MED ORDER — PROPOFOL 10 MG/ML IV BOLUS
INTRAVENOUS | Status: AC
  Filled 2022-07-11: qty 20

## 2022-07-11 MED ORDER — SODIUM CHLORIDE 0.9 % IV SOLN: INTRAVENOUS | Status: DC | PRN

## 2022-07-11 NOTE — Anesthesia Preprocedure Evaluation (Signed)
Anesthesia Evaluation  Patient identified by MRN, date of birth, ID band Patient awake    Reviewed: Allergy & Precautions, H&P , NPO status , Patient's Chart, lab work & pertinent test results, reviewed documented beta blocker date and time   History of Anesthesia Complications (+) history of anesthetic complications  Airway Mallampati: II   Neck ROM: full    Dental  (+) Poor Dentition   Pulmonary sleep apnea and Continuous Positive Airway Pressure Ventilation    Pulmonary exam normal        Cardiovascular Exercise Tolerance: Poor hypertension, + angina with exertion + CAD, + Peripheral Vascular Disease and +CHF  Normal cardiovascular exam+ pacemaker  Rhythm:regular Rate:Normal  Has PM and followed by Dossie Arbour. ja   Neuro/Psych  Headaches PSYCHIATRIC DISORDERS Anxiety Depression     Neuromuscular disease    GI/Hepatic Neg liver ROS, hiatal hernia,GERD  Medicated,,  Endo/Other  diabetesHypothyroidism    Renal/GU Renal disease  negative genitourinary   Musculoskeletal   Abdominal   Peds  Hematology  (+) Blood dyscrasia, anemia   Anesthesia Other Findings Past Medical History: No date: Acquired elevated diaphragm     Comment:  HIGHER ON RIGHT SIDE No date: Anemia No date: Anxiety No date: Aortic atherosclerosis (HCC) No date: Arthritis No date: CAD (coronary artery disease)     Comment:  a. 07/2017 MV: Low risk; b. 09/2017 Cath: LM nl, LAD               50p/m, D1 80ost, LCX large/nl, RCA small/nl. No date: Chronic back pain     Comment:  stenosis.degenerative disc,some scoliosis No date: Chronic heart failure with preserved ejection fraction  (HFpEF) (HCC)     Comment:  a. 07/2017 Echo: EF 60-65%, no rwma, gr2 DD, nl RV fxn. No date: Chronic kidney disease     Comment:  stage 3 2018: Complication of anesthesia     Comment:  aspirated during wrist surgery during LMA removal No date: Constipation     Comment:   takes Stool Softener daily No date: Coronary artery disease No date: Depression     Comment:  takes Cymbalta daily No date: Diabetes mellitus     Comment:  Type 2 diabetic. Average fasting blood sugar runs high               170-200 07/17/2017: Diastolic CHF (HCC)     Comment:  Per patient, diagnosed in 2018 No date: Diverticulosis 09/12/2017: Dysplastic nevus     Comment:  L sup buttock - mild  09/18/2018: Dysplastic nevus     Comment:  R sup med buttocks - mild  No date: E coli infection No date: GERD (gastroesophageal reflux disease)     Comment:  takes Nexium daily No date: Grade II diastolic dysfunction No date: Headache No date: Hemorrhoids No date: History of colon polyps     Comment:  benign No date: History of gout     Comment:  doesn't take any meds No date: History of hiatal hernia     Comment:  small No date: History of vertigo     Comment:  doesn't take any meds No date: Hyperlipidemia     Comment:  takes Praluent daily No date: Hypertension     Comment:  currently BP medications are on hold  No date: Hypothyroidism     Comment:  takes Synthroid daily No date: Insomnia     Comment:  takes Restoril nightly No date: Muscle spasm     Comment:  takes Robaxin as needed No date: NSVT (nonsustained ventricular tachycardia) (HCC) No date: OSA on CPAP No date: Paroxysmal atrial tachycardia No date: Peripheral vascular disease (HCC)     Comment:  AAA as stated per pt / was just discovered and pt states              has not been referred to vascular MD  No date: Presence of permanent cardiac pacemaker No date: Restless leg     Comment:  takes Requip daily No date: Rosacea No date: Symptomatic bradycardia     Comment:  a. 02/2019 s/p Biotronik Edora 8 DR-T DC PPM No date: Syncope     Comment:  a. Documented bradycardia and pauses-->02/2019 s/p               Biotronik Edora 8 DR-T DC PPM. No date: Varicose veins Past Surgical History: No date: ABDOMINAL  HYSTERECTOMY     Comment:  with BSo No date: BACK SURGERY 2013: CARDIAC CATHETERIZATION     Comment:  Normal No date: CARPAL TUNNEL RELEASE; Bilateral No date: CATARACT EXTRACTION, BILATERAL No date: CHOLECYSTECTOMY No date: COLONOSCOPY 11/25/2018: COLONOSCOPY WITH PROPOFOL; N/A     Comment:  Procedure: COLONOSCOPY WITH PROPOFOL;  Surgeon: Tol[place],               Boykin Nearing, MD;  Location: [facility] ENDOSCOPY;  Service:               Gastroenterology;  Laterality: N/A; No date: DIAGNOSTIC LAPAROSCOPY     Comment:  multiple times No date: DILATION AND CURETTAGE OF UTERUS 11/01/2014: ESOPHAGOGASTRODUODENOSCOPY (EGD) WITH PROPOFOL; N/A     Comment:  Procedure: ESOPHAGOGASTRODUODENOSCOPY (EGD) WITH               PROPOFOL;  Surgeon: Wallace Cullens, MD;  Location: [facility]               ENDOSCOPY;  Service: Gastroenterology;  Laterality: N/A; 11/25/2018: ESOPHAGOGASTRODUODENOSCOPY (EGD) WITH PROPOFOL; N/A     Comment:  Procedure: ESOPHAGOGASTRODUODENOSCOPY (EGD) WITH               PROPOFOL;  Surgeon: Toledo, Boykin Nearing, MD;  Location:               [facility] ENDOSCOPY;  Service: Gastroenterology;  Laterality:               N/A; 10/11/2016: HARDWARE REMOVAL; Left     Comment:  Procedure: HARDWARE REMOVAL-LEFT RADIUS;  Surgeon:               Lyndle Herrlich, MD;  Location: [facility] ORS;  Service:               Orthopedics;  Laterality: Left;  Left Radius Wrist  No date: IMPLANTABLE CONTACT LENS IMPLANTATION     Comment:  bilateral 05/09/2020: KNEE ARTHROSCOPY; Right     Comment:  Procedure: ARTHROSCOPY KNEE;  Surgeon: Donato Heinz,               MD;  Location: [facility] ORS;  Service: Orthopedics;                Laterality: Right; 11/13/2015: LAMINECTOMY 10/01/2017: LEFT HEART CATH AND CORONARY ANGIOGRAPHY; Left     Comment:  Procedure: LEFT HEART CATH AND CORONARY ANGIOGRAPHY;                Surgeon: Yvonne Kendall, MD;  Location: [facility] IN[facility]IVE  CV LAB;  Service: Cardiovascular;  Laterality:  Left; 10/17/2017: LOOP RECORDER INSERTION; N/A     Comment:  Procedure: LOOP RECORDER INSERTION;  Surgeon: Duke Salvia, MD;  Location: [facility] INV[facility] CV LAB;  Service:               Cardiovascular;  Laterality: N/A; 03/04/2019: LOOP RECORDER REMOVAL; N/A     Comment:  Procedure: LOOP RECORDER REMOVAL;  Surgeon: Duke Salvia, MD;  Location: [facility] INVASIVE CV LAB;  Service:               Cardiovascular;  Laterality: N/A; 11/2015: LUMBAR FUSION 12/02/2015: LUMBAR WOUND DEBRIDEMENT; N/A     Comment:  Procedure: WOUND Exploration;  Surgeon: Lisbeth Renshaw, MD;  Location: [facility] OR;  Service: Neurosurgery;               Laterality: N/A; 08/29/2016: OPEN REDUCTION INTERNAL FIXATION (ORIF) DISTAL RADIAL  FRACTURE; Left     Comment:  Procedure: OPEN REDUCTION INTERNAL FIXATION (ORIF)               DISTAL RADIAL FRACTURE;  Surgeon: Lyndle Herrlich, MD;                Location: [facility] ORS;  Service: Orthopedics;  Laterality:               Left; 03/04/2019: PACEMAKER IMPLANT; N/A     Comment:  Procedure: PACEMAKER IMPLANT;  Surgeon: Duke Salvia,              MD;  Location: [facility] INVASIVE CV LAB;  Service:               Cardiovascular;  Laterality: N/A; No date: PICC LINE PLACE PERIPHERAL ([facility] HX)     Comment:  right upper arm  12/26/2021: RIGHT/LEFT HEART CATH AND CORONARY ANGIOGRAPHY; N/A     Comment:  Procedure: RIGHT/LEFT HEART CATH AND CORONARY               ANGIOGRAPHY;  Surgeon: Yvonne Kendall, MD;  Location:               [facility] INVASIVE CV LAB;  Service: Cardiovascular;                Laterality: N/A; No date: ROTATOR CUFF REPAIR; Right 11/01/2014: SAVORY DILATION; N/A     Comment:  Procedure: SAVORY DILATION;  Surgeon: Wallace Cullens, MD;                Location: [facility] ENDOSCOPY;  Service: Gastroenterology;                Laterality: N/A; No date: TONSILLECTOMY No date: TRIGGER FINGER RELEASE; Bilateral BMI    Body Mass Index: 39.57 kg/m      Reproductive/Obstetrics negative OB ROS                             Anesthesia Physical Anesthesia Plan  ASA: 4  Anesthesia Plan: General   Post-op Pain Management:    Induction:   PONV Risk Score and Plan:   Airway Management Planned:   Additional Equipment:   Intra-op Plan:   Post-operative Plan:   Informed Consent: I have reviewed the  patients History and Physical, chart, labs and discussed the procedure including the risks, benefits and alternatives for the proposed anesthesia with the patient or authorized representative who has indicated his/her understanding and acceptance.     Dental Advisory Given  Plan Discussed with: CRNA  Anesthesia Plan Comments:        Anesthesia Quick Evaluation

## 2022-07-11 NOTE — H&P (Signed)
Outpatient short stay form Pre-procedure 07/11/2022 8:37 AM [name] [name], M.D.  Primary Physician: Bethann Punches, M.D.  Reason for visit:  Hemetemesis, dysphagia  History of present illness:  [name] presents to the [facility] GI clinic for urgent consultation at the request of her PCP for chief complaint of hematemesis/UGIB, nausea, esophageal dysphagia, and epigastric abdominal pain. She was seen in the ED on 4/19 last month after having episode of hematemesis at home and presyncope. Work-up in the ED showed hypokalemia (2.8) and stable hemoglobin 11.9. She was given IV and PO potassium replacement and IV Protonix with no further episodes of bleeding. She was told she might have orthostatic syncope from diuretic pills. She followed up with Dr. Hyacinth Meeker on 4/25 and had repeat labs showing hemoglobin 10.3 and ferritin 8. She was started on oral iron therapy and add sucralfate 1 gram PO QID added to her regimen. She remains on Nexium 40 mg twice daily. She called in to Dr. Rondel Baton office on 5/13 last week with concerns for passing black, tarry stools and she was urgently referred to GI for further evaluation. She reports she is doing okay at this time. She continues to have intermittent epigastric abdominal pain described as aching in nature with associated symptoms of nausea without recurrent vomiting, belching, sour stomach, and borborygmi. She has had no further episodes of hematemesis. Stools appear a dark brown to dark green in color. She denies any current black, tarry appearing stools. She has had mild constipation. No frank hematochezia. She thinks she needs to have another endoscopy. For the past few months her esophageal dysphagia symptoms have also returned where she is feeling like foods she is eating are hanging up at level of suprasternal notch. She had her last EGD 11/2018 with Dr. Norma Castaneda which showed distal esophageal stricture s/p dilatation with [facility] dilator to 54 Fr. She also had EGD  in 2016 which showed stricture. Last colonoscopy 11/2018 showed left-sided colonic diverticulosis, one 11 mm TA removed from ileocecal valve with clips placed, two 5-7 mm hyperplastic polyps removed from sigmoid colon and ileocecal valve, and grade II non-bleeding IH.     Current Facility-Administered Medications:    0.9 %  sodium chloride infusion, , Intravenous, Continuous, [place], Boykin Nearing, MD, Last Rate: 20 mL/hr at 07/11/22 0830, 20 mL/hr at 07/11/22 0830  Facility-Administered Medications Ordered in Other Encounters:    dexamethasone (DECADRON) injection 10 mg, 10 mg, Intravenous, Once, Lyndle Herrlich, MD  Medications Prior to Admission  Medication Sig Dispense Refill Last Dose   Acetaminophen (TYLENOL ARTHRITIS PAIN PO) Take 1,300 mg by mouth every 8 (eight) hours as needed (pain).   07/10/2022   albuterol (PROVENTIL) (2.5 MG/3ML) 0.083% nebulizer solution Take 3 mLs (2.5 mg total) by nebulization every 4 (four) hours as needed for wheezing or shortness of breath. 75 mL 12 07/10/2022   albuterol (VENTOLIN HFA) 108 (90 Base) MCG/ACT inhaler Inhale 1 puff into the lungs every 4 (four) hours as needed.   07/10/2022   aspirin 81 MG tablet Take 81 mg by mouth at bedtime.    Past Week   atenolol (TENORMIN) 25 MG tablet Take 1 tablet (25 mg total) by mouth every morning. 45 tablet 3 07/11/2022 at 0600   Azelaic Acid (FINACEA) 15 % FOAM Apply 1 application. topically as directed. Qd to bid to aa face 150 g 4 07/10/2022   azelastine (ASTELIN) 0.1 % nasal spray Place 2 sprays into both nostrils 2 (two) times daily as needed for rhinitis.  07/10/2022   cholecalciferol (VITAMIN D3) 25 MCG (1000 UNIT) tablet Take 1,000 Units by mouth daily.   07/10/2022   diclofenac Sodium (VOLTAREN) 1 % GEL Apply 2 g topically 2 (two) times daily.   07/10/2022   doxycycline (ORACEA) 40 MG capsule Take 1 capsule (40 mg total) by mouth every morning. 30 capsule 2 07/10/2022   empagliflozin (JARDIANCE) 10 MG TABS tablet Take  10 mg by mouth daily.   07/10/2022   esomeprazole (NEXIUM) 40 MG capsule Take 40 mg by mouth 2 (two) times daily.   07/10/2022   gabapentin (NEURONTIN) 300 MG capsule Take 300 mg by mouth 2 (two) times daily.   07/10/2022   Insulin Aspart (NOVOLOG FLEXPEN [place]) Inject 10 Units into the skin as needed (diabetes).   07/10/2022   levothyroxine (SYNTHROID, LEVOTHROID) 75 MCG tablet Take 75 mcg by mouth daily before breakfast.    07/10/2022   methocarbamol (ROBAXIN) 500 MG tablet Take 500 mg by mouth every 8 (eight) hours as needed for muscle spasms.   07/10/2022   metolazone (ZAROXOLYN) 2.5 MG tablet Take 2.5 mg by mouth daily as needed (fluid overload).   07/10/2022   mometasone (ELOCON) 0.1 % cream Apply 1 application topically daily as needed (Rash). Qd to bid up to 5 days a week aa right lower leg prn itching 45 g 1 07/10/2022   nitroGLYCERIN (NITROSTAT) 0.4 MG SL tablet Place 1 tablet (0.4 mg total) under the tongue every 5 (five) minutes as needed for chest pain. 25 tablet 0 Past Month   ondansetron (ZOFRAN) 4 MG tablet Take 4 mg by mouth every 8 (eight) hours as needed for nausea or vomiting.    07/10/2022   potassium chloride (KLOR-CON) 10 MEQ tablet Take 2 tablet (20 meq) by mouth twice daily as directed 360 tablet 1 07/10/2022   PRALUENT 150 MG/ML SOAJ INJECT 150 MG UNDER THE SKIN EVERY 14 DAYS 6 mL 3 07/10/2022   ranolazine (RANEXA) 500 MG 12 hr tablet Take 2 tablets (1,000 mg total) by mouth 2 (two) times daily. 360 tablet 3 07/10/2022   ropinirole (REQUIP) 5 MG tablet Take 5 mg by mouth at bedtime.   07/10/2022   temazepam (RESTORIL) 30 MG capsule Take 30 mg by mouth at bedtime.   07/10/2022   torsemide (DEMADEX) 20 MG tablet Take 2 tablets (40 mg total) by mouth 2 (two) times daily. As directed 360 tablet 1 07/10/2022   traMADol (ULTRAM) 50 MG tablet Take 50 mg by mouth every 6 (six) hours as needed for moderate pain or severe pain.   07/10/2022   triamcinolone cream (KENALOG) 0.1 % Apply to affected skin  qd-bid 5 days a week prn flares. Avoid applying to face, groin, and axilla. Use as directed. Long-term use can cause thinning of the skin. 240 g 1 07/10/2022   trolamine salicylate (ASPERCREME) 10 % cream Apply 1 application topically at bedtime.   07/10/2022   LANTUS SOLOSTAR 100 UNIT/ML Solostar Pen Inject 28 Units into the skin every morning.      ondansetron (ZOFRAN-ODT) 4 MG disintegrating tablet Take 1 tablet (4 mg total) by mouth every 6 (six) hours as needed for nausea or vomiting. (Patient not taking: Reported on 06/05/2022) 20 tablet 0      Allergies  Allergen Reactions   Ezetimibe Diarrhea and Nausea Only   Hydrocodone-Acetaminophen Other (See Comments)   Metoclopramide Diarrhea   Propoxyphene Other (See Comments)    Unsure of reaction type Unsure of reaction type Unsure of  reaction type   Cefuroxime    Hydrocodone Other (See Comments)   Tramadol Hives   Ceftin [Cefuroxime Axetil] Diarrhea   Codeine Rash        Penicillins Rash and Other (See Comments)    Has patient had a PCN reaction causing immediate rash, facial/tongue/throat swelling, SOB or lightheadedness with hypotension: NO Has patient had a PCN reaction causing severe rash involving mucus membranes or skin necrosis: NO Has patient had a PCN retioion that required hospitalization NO Has patient had a PCN reaction occurring within the last 10 years: NO If all of the above answers are "NO", then may proceed with Cephalosporin use.   Statins Other (See Comments)    Leg pain Leg pain Other reaction(s): restless leg syndrome   Vicodin [Hydrocodone-Acetaminophen] Other (See Comments)    passes out     Past Medical History:  Diagnosis Date   Acquired elevated diaphragm    HIGHER ON RIGHT SIDE   Anemia    Anxiety    Aortic atherosclerosis (HCC)    Arthritis    CAD (coronary artery disease)    a. 07/2017 MV: Low risk; b. 09/2017 Cath: LM nl, LAD 50p/m, D1 80ost, LCX large/nl, RCA small/nl.   Chronic back pain     stenosis.degenerative disc,some scoliosis   Chronic heart failure with preserved ejection fraction (HFpEF) (HCC)    a. 07/2017 Echo: EF 60-65%, no rwma, gr2 DD, nl RV fxn.   Chronic kidney disease    stage 3   Complication of anesthesia 2018   aspirated during wrist surgery during LMA removal   Constipation    takes Stool Softener daily   Coronary artery disease    Depression    takes Cymbalta daily   Diabetes mellitus    Type 2 diabetic. Average fasting blood sugar runs high 170-200   Diastolic CHF (HCC) 07/17/2017   Per patient, diagnosed in 2018   Diverticulosis    Dysplastic nevus 09/12/2017   L sup buttock - mild    Dysplastic nevus 09/18/2018   R sup med buttocks - mild    E coli infection    GERD (gastroesophageal reflux disease)    takes Nexium daily   Grade II diastolic dysfunction    Headache    Hemorrhoids    History of colon polyps    benign   History of gout    doesn't take any meds   History of hiatal hernia    small   History of vertigo    doesn't take any meds   Hyperlipidemia    takes Praluent daily   Hypertension    currently BP medications are on hold    Hypothyroidism    takes Synthroid daily   Insomnia    takes Restoril nightly   Muscle spasm    takes Robaxin as needed   NSVT (nonsustained ventricular tachycardia) (HCC)    OSA on CPAP    Paroxysmal atrial tachycardia    Peripheral vascular disease (HCC)    AAA as stated per pt / was just discovered and pt states has not been referred to vascular MD    Presence of permanent cardiac pacemaker    Restless leg    takes Requip daily   Rosacea    Symptomatic bradycardia    a. 02/2019 s/p Biotronik [place] 8 DR-T DC PPM   Syncope    a. Documented bradycardia and pauses-->02/2019 s/p Biotronik [place] 8 DR-T DC PPM.   Varicose veins     Review  of systems:  Otherwise negative.    Physical Exam  Gen: Alert, oriented. Appears stated age.  HEENT: [place]/AT. PERRLA. Lungs: CTA, no wheezes. CV: RR nl  S1, S2. Abd: soft, benign, no masses. BS+ Ext: No edema. Pulses 2+    Planned procedures: Proceed with EGD. The patient understands the nature of the planned procedure, indications, risks, alternatives and potential complications including but not limited to bleeding, infection, perforation, damage to internal organs and possible oversedation/side effects from anesthesia. The patient agrees and gives consent to proceed.  Please refer to procedure notes for findings, recommendations and patient disposition/instructions.     Kenroy Timberman [name], M.D. Gastroenterology 07/11/2022  8:37 AM

## 2022-07-11 NOTE — Anesthesia Postprocedure Evaluation (Signed)
Anesthesia Post Note  Patient: [name]  Procedure(s) Performed: ESOPHAGOGASTRODUODENOSCOPY (EGD) WITH PROPOFOL  Patient location during evaluation: PACU Anesthesia Type: General Level of consciousness: awake and alert Pain management: pain level controlled Vital Signs Assessment: post-procedure vital signs reviewed and stable Respiratory status: spontaneous breathing, nonlabored ventilation, respiratory function stable and patient connected to nasal cannula oxygen Cardiovascular status: blood pressure returned to baseline and stable Postop Assessment: no apparent nausea or vomiting Anesthetic complications: no   No notable events documented.   Last Vitals:  Vitals:   07/11/22 0924 07/11/22 0946  BP: 123/61 (!) 115/58  Pulse: 83   Resp: 11   Temp: 36.5 C   SpO2: 98%     Last Pain:  Vitals:   07/11/22 0946  TempSrc:   PainSc: 0-No pain                 Yevette Edwards

## 2022-07-11 NOTE — Transfer of Care (Signed)
Immediate Anesthesia Transfer of Care Note  Patient: [name]  Procedure(s) Performed: ESOPHAGOGASTRODUODENOSCOPY (EGD) WITH PROPOFOL  Patient Location: PACU  Anesthesia Type:MAC  Level of Consciousness: awake  Airway & Oxygen Therapy: Patient Spontanous Breathing  Post-op Assessment: Report given to RN and Post -op Vital signs reviewed and stable  Post vital signs: Reviewed  Last Vitals:  Vitals Value Taken Time  BP 123/61 07/11/22 0928  Temp 36.5 C 07/11/22 0924  Pulse 83 07/11/22 0928  Resp 19 07/11/22 0928  SpO2 96 % 07/11/22 0928  Vitals shown include unvalidated device data.  Last Pain:  Vitals:   07/11/22 0924  TempSrc: Temporal  PainSc: 0-No pain         Complications: No notable events documented.

## 2022-07-11 NOTE — Op Note (Addendum)
[facility] Patient Name: [name] Procedure Date: 07/11/2022 9:02 AM MRN: [id number] Account #: [id number] Date of Birth: 1945/11/25 Admit Type: Outpatient Age: 77 Room: [facility] ENDO ROOM 2 Gender: Female Note Status: Finalized Instrument Name: Upper Endoscope [id number] Procedure:             Upper GI endoscopy Indications:           Epigastric abdominal pain, Dysphagia,                         Gastro-esophageal reflux disease, Stricture of the                         esophagus, Nausea Providers:             Boykin Nearing. Norma Fredrickson MD, MD Referring MD:          Danella Penton, MD (Referring MD) Medicines:             Propofol per Anesthesia Complications:         No immediate complications. Estimated blood loss:                         Minimal. Procedure:             Pre-Anesthesia Assessment:                        - The risks and benefits of the procedure and the                         sedation options and risks were discussed with the                         patient. All questions were answered and informed                         consent was obtained.                        - Patient identification and proposed procedure were                         verified prior to the procedure by the nurse. The                         procedure was verified in the procedure room.                        - ASA Grade Assessment: III - A patient with severe                         systemic disease.                        - After reviewing the risks and benefits, the patient                         was deemed in satisfactory condition to undergo the  procedure.                        After obtaining informed consent, the endoscope was                         passed under direct vision. Throughout the procedure,                         the patient's blood pressure, pulse, and oxygen                         saturations were monitored  continuously. The Endoscope                         was introduced through the mouth, and advanced to the                         third part of duodenum. The upper GI endoscopy was                         accomplished without difficulty. The patient tolerated                         the procedure well. Findings:      One benign-appearing, intrinsic mild stenosis was found at the       gastroesophageal junction. This stenosis measured 1.5 cm (inner       diameter) x less than one cm (in length). The stenosis was traversed.       The scope was withdrawn. Dilation was performed with a Maloney dilator       with no resistance at 54 Fr.      The exam of the esophagus was otherwise normal.      A 1 cm hiatal hernia was present.      A single 15 mm sessile polyp with no bleeding and no stigmata of recent       bleeding was found in the cardia. Biopsies were taken with a cold       forceps for histology.      Patchy mild inflammation characterized by congestion (edema) and       erythema was found in the gastric body and in the gastric antrum.       Biopsies were taken with a cold forceps for Helicobacter pylori testing.      The exam of the stomach was otherwise normal.      The examined duodenum was normal.      The exam was otherwise without abnormality. Impression:            - Benign-appearing esophageal stenosis. Dilated.                        - 1 cm hiatal hernia.                        - A single gastric polyp. Biopsied.                        - Gastritis. Biopsied.                        -  Normal examined duodenum.                        - The examination was otherwise normal. Recommendation:        - Patient has a contact number available for                         emergencies. The signs and symptoms of potential                         delayed complications were discussed with the patient.                         Return to normal activities tomorrow. Written                          discharge instructions were provided to the patient.                        - Resume previous diet.                        - Continue present medications.                        - Await pathology results.                        - Monitor results to esophageal dilation                        - Follow up with Jacob Moores, PA-C in the GI office.                         [phone]                        - Telephone GI office to schedule appointment in 3                         months.                        - The findings and recommendations were discussed with                         the patient. Procedure Code(s):     --- Professional ---                        [phone], Esophagogastroduodenoscopy, flexible,                         transoral; with biopsy, single or multiple                        43450, Dilation of esophagus, by unguided sound or                         bougie, single or multiple passes Diagnosis Code(s):     --- Professional ---  R11.0, Nausea                        K21.9, Gastro-esophageal reflux disease without                         esophagitis                        R13.10, Dysphagia, unspecified                        R10.13, Epigastric pain                        K29.70, Gastritis, unspecified, without bleeding                        K31.7, Polyp of stomach and duodenum                        K44.9, Diaphragmatic hernia without obstruction or                         gangrene                        K22.2, Esophageal obstruction CPT copyright 2022 American Medical Association. All rights reserved. The codes documented in this report are preliminary and upon coder review may  be revised to meet current compliance requirements. Stanton Kidney MD, MD 07/11/2022 9:23:24 AM This report has been signed electronically. Number of Addenda: 0 Note Initiated On: 07/11/2022 9:02 AM Estimated Blood Loss:  Estimated blood loss: none. Estimated blood  loss was                         minimal.      [facility]

## 2022-07-12 ENCOUNTER — Encounter: Payer: Self-pay | Admitting: Internal Medicine

## 2022-07-13 ENCOUNTER — Encounter: Payer: Self-pay | Admitting: Dermatology

## 2022-07-19 ENCOUNTER — Telehealth: Payer: Self-pay

## 2022-07-19 MED ORDER — DOXYCYCLINE HYCLATE 20 MG PO TABS: 20.0000 mg | ORAL_TABLET | Freq: Two times a day (BID) | ORAL | 2 refills | Status: DC

## 2022-07-19 NOTE — Telephone Encounter (Signed)
Doxycycline 20mg  sent in as replacement due to Doxycycline 40mg  being denied. aw

## 2022-08-12 ENCOUNTER — Emergency Department
Admission: EM | Admit: 2022-08-12 | Discharge: 2022-08-12 | Disposition: A | Discharge status: Home / Self Care | Payer: Medicare Other | Attending: Emergency Medicine | Admitting: Emergency Medicine

## 2022-08-12 ENCOUNTER — Ambulatory Visit: Admit: 2022-08-12 | Payer: Medicare Other

## 2022-08-12 ENCOUNTER — Emergency Department: Payer: Medicare Other

## 2022-08-12 ENCOUNTER — Other Ambulatory Visit: Payer: Self-pay

## 2022-08-12 DIAGNOSIS — I130 Hypertensive heart and chronic kidney disease with heart failure and stage 1 through stage 4 chronic kidney disease, or unspecified chronic kidney disease: Secondary | ICD-10-CM | POA: Insufficient documentation

## 2022-08-12 DIAGNOSIS — I509 Heart failure, unspecified: Secondary | ICD-10-CM | POA: Insufficient documentation

## 2022-08-12 DIAGNOSIS — R55 Syncope and collapse: Secondary | ICD-10-CM

## 2022-08-12 DIAGNOSIS — N189 Chronic kidney disease, unspecified: Secondary | ICD-10-CM | POA: Insufficient documentation

## 2022-08-12 DIAGNOSIS — E860 Dehydration: Secondary | ICD-10-CM

## 2022-08-12 LAB — BASIC METABOLIC PANEL
Anion gap: 14 (ref 5–15)
BUN: 22 mg/dL (ref 8–23)
CO2: 27 mmol/L (ref 22–32)
Calcium: 8.9 mg/dL (ref 8.9–10.3)
Chloride: 95 mmol/L — ABNORMAL LOW (ref 98–111)
Creatinine, Ser: 1.29 mg/dL — ABNORMAL HIGH (ref 0.44–1.00)
GFR, Estimated: 43 mL/min — ABNORMAL LOW (ref >60)
Glucose, Bld: 284 mg/dL — ABNORMAL HIGH (ref 70–99)
Potassium: 2.8 mmol/L — ABNORMAL LOW (ref 3.5–5.1)
Sodium: 136 mmol/L (ref 135–145)

## 2022-08-12 LAB — CBC
HCT: 38.5 % (ref 36.0–46.0)
Hemoglobin: 11.4 g/dL — ABNORMAL LOW (ref 12.0–15.0)
MCH: 23.9 pg — ABNORMAL LOW (ref 26.0–34.0)
MCHC: 29.6 g/dL — ABNORMAL LOW (ref 30.0–36.0)
MCV: 80.9 fL (ref 80.0–100.0)
Platelets: 247 10*3/uL (ref 150–400)
RBC: 4.76 MIL/uL (ref 3.87–5.11)
RDW: 17.5 % — ABNORMAL HIGH (ref 11.5–15.5)
WBC: 7 10*3/uL (ref 4.0–10.5)
nRBC: 0 % (ref 0.0–0.2)

## 2022-08-12 LAB — TROPONIN I (HIGH SENSITIVITY): Troponin I (High Sensitivity): 6 ng/L (ref <18)

## 2022-08-12 MED ORDER — SODIUM CHLORIDE 0.9 % IV BOLUS
500.0000 mL | Freq: Once | INTRAVENOUS | Status: AC
  Administered 2022-08-12: 500 mL via INTRAVENOUS

## 2022-08-12 NOTE — Discharge Instructions (Signed)
As we discussed please crease your intake of fluids over the next 2 to 3 days.  Please skip tomorrow's dose of torsemide.  Please follow-up with your doctor within the next 2 days or so for recheck/reevaluation.  Return to the emergency department if you begin feeling dizzy lightheaded or for any other symptom personally concerning to yourself.  Please continue to take your potassium supplement as prescribed by your physician.

## 2022-08-12 NOTE — ED Provider Notes (Signed)
[facility] Provider Note    Event Date/Time   First MD Initiated Contact with Patient 08/12/22 1204     (approximate)  History   Chief Complaint: Near Syncope  HPI  [name] is a 77 y.o. female with a past medical history of anxiety, CHF, CKD, hypertension, hyperlipidemia, presents to the emergency department for generalized weakness and near syncope.  According to the patient she has a history of CHF takes torsemide on a daily basis.  States her dry weight had risen and she was concerned for fluid overload her doctor informed her to take her normal torsemide plus metolazone on Thursday.  Patient states by Friday she had lost 5 pounds due to significant amounts of urination.  Took her normal torsemide Friday but began feeling very weak and lightheaded by Friday afternoon.  States Saturday and today has not taken her torsemide yet but continues to feel weak and when she stands or walks often will feel lightheaded like she might pass out.  Denies any chest pain at any point.  No abdominal pain.  No fever vomiting or diarrhea.  Physical Exam   Triage Vital Signs: ED Triage Vitals [08/12/22 1137]  Enc Vitals Group     BP (!) 152/75     Pulse Rate (!) 108     Resp 20     Temp 98.3 F (36.8 C)     Temp Source Oral     SpO2 98 %     Weight 204 lb (92.5 kg)     Height 5\' 1"  (1.549 m)     Head Circumference      Peak Flow      Pain Score 0     Pain Loc      Pain Edu?      Excl. in GC?     Most recent vital signs: Vitals:   08/12/22 1137  BP: (!) 152/75  Pulse: (!) 108  Resp: 20  Temp: 98.3 F (36.8 C)  SpO2: 98%    General: Awake, no distress.  CV:  Good peripheral perfusion.  Regular rate and rhythm  Resp:  Normal effort.  Equal breath sounds bilaterally.  Abd:  No distention.  Soft, nontender.  No rebound or guarding. Other:  No obvious lower extremity edema on exam.   ED Results / Procedures / Treatments   EKG  EKG viewed and  interpreted by myself shows sinus tachycardia 106 bpm with a narrow QRS, normal axis, normal interval, nonspecific ST changes.  RADIOLOGY  I have reviewed and interpreted chest x-ray images.  No consolidation seen on my evaluation.  Pacemaker present. Radiology is read the x-ray as no active disease.   MEDICATIONS ORDERED IN ED: Medications - No data to display   IMPRESSION / MDM / ASSESSMENT AND PLAN / ED COURSE  I reviewed the triage vital signs and the nursing notes.  Patient's presentation is most consistent with acute presentation with potential threat to life or bodily function.  Patient presents to the emergency department for near syncope episodes.  States this has been ongoing since Friday.  Thursday she took metolazone in addition to her torsemide and lost 5 pounds overnight due to frequent urination.  Highly suspect possible overdiuresis/dehydration.  Chest x-ray is clear, EKG reassuring.  Patient's lab work shows reassuring CBC, negative troponin, mild renal insufficiency on chemistry however largely unchanged from historical values, mildly hyperglycemic.  We will IV hydrate the patient and continue to closely monitor.  Patient  is mildly tachycardic again possibly indicating more of mild dehydration.  Patient is feeling better.  Receiving IV fluids.  Highly suspect the patient's symptoms are due to her elevated anion gap/dehydration from overdiuresis.  Discussed with the patient increase her oral intake over the next 2 days to skip her torsemide once again tomorrow and then restart Tuesday follow-up with her doctor Tuesday or Wednesday for recheck/reevaluation.  Discussed return precautions.  Patient reassured by the workup and agreeable to plan of care.  FINAL CLINICAL IMPRESSION(S) / ED DIAGNOSES   Near syncope Dehydration    Note:  This document was prepared using Dragon voice recognition software and may include unintentional dictation errors.   Minna Antis,  MD 08/12/22 1427

## 2022-08-12 NOTE — ED Triage Notes (Signed)
Pt states for the last 2 days she has felt like passing out. Pt states that she is on diuretics. Pt states chest pressure and shortness of breath as well.

## 2022-08-13 ENCOUNTER — Telehealth: Payer: Self-pay | Admitting: Cardiovascular Disease

## 2022-08-13 NOTE — Telephone Encounter (Signed)
Called and spoke with patient regarding the following.  With potassium 2.8 would make sure she takes at least 6 of her potassium 10 mill equivalents today With weight elevated would take torsemide 40 twice a day Make sure fluid intake is not high Probably going to need 6 potassium again tomorrow July 2 Thx Tg   Patient verbalizes understanding.

## 2022-08-13 NOTE — Telephone Encounter (Signed)
Spoke with patient and she is having weight gain, shortness of breath and near passing out. She states that when she takes the metolazone it causes her to feel like passing out and she collapses she is so weak. She takes her torsemide and only takes metolazone maybe every 2 weeks because of how it affects her. Her weight is up from 204 to 212.   She went to ER yesterday and they told her that potassium was normal but when she looked on My Chart it showed 2.8 and they did not give her any additional potassium or instructions on this when they sent her home. Scheduled her to come in and see [name] tomorrow and she was agreeable with that plan.

## 2022-08-13 NOTE — Telephone Encounter (Signed)
Returned call to patient. Reviewed with patient recommendations from Dr. Mariah Milling. Patient verbalizes understanding.

## 2022-08-13 NOTE — Telephone Encounter (Signed)
Pt c/o medication issue:  1. Name of Medication:  metolazone (ZAROXOLYN) 2.5 MG tablet  2. How are you currently taking this medication (dosage and times per day)?   3. Are you having a reaction (difficulty breathing--STAT)?   4. What is your medication issue?   Patient states her last day taking this medication was Thursday because her weight was at or above 212 lbs. She states she always taking it with Toresmide. She states she takes Torsemide every day--sometimes she takes 2 tablets, sometimes 3, and sometimes 4. Patient states the medication has been causing near syncope, dizziness, and fatigue. She states yesterday she went to the hospital.

## 2022-08-13 NOTE — Telephone Encounter (Signed)
Patient is following up requesting to speak with Elon Jester, RN.

## 2022-08-13 NOTE — Progress Notes (Unsigned)
Date:  08/14/2022   ID:  [name], DOB 1945-07-02, MRN [id number]  Patient Location:  [address] [place] [address]   Provider location:   [facility], [place] office  PCP:  Danella Penton, MD  Cardiologist:  Hubbard Robinson [facility]   Chief Complaint  Patient presents with   Shortness of breath & weight gain    Patient c/o shortness of breath, wheezing, tachycardia at night & has continue with dizziness/lightheadedness. Medications reviewed by the patient verbally.     History of Present Illness:    [name] is a 77 y.o. female  past medical history of CAD,  cardiac catheterization in 2009 , June 2013, August 2019 moderate LAD, diagonal and RCA disease, Obesity,  diabetes,  hyperlipidemia with statin intolerance, on praluent obstructive sleep apnea on CPAP,   Tachycardia episodes,    leg edema,  lymphedema compression pumps seen in the hospital for severe hypertension and chest pain,  EF 60% in 07/2016 Loop monitor in place for syncope syncope with prolonged bradycardia more than 30 seconds.  There is at least 13 seconds of asystole with occasional scattered PVCs. Pacer, followed by Dr. Graciela Husbands 02/2019  recurrent syncope assoc with nausea and vomiting- Back surgeries 2017, 2021, both with postoperative infection Cardiac catheterization November 2023, medical recommended who presents for routine followup of her coronary artery disease and diastolic CHF  Last seen by myself in clinic January 2024 Seen by EP April 2024  Last seen in 1/24, prior diuretic regimen recommended For weight <205, stay on torsemide 40 daily For weight >205 to 210, torsemide 40 in Am, extra torsemide 20 mg after lunch For weight >210, take torsemide 40 mg twice a day (couple days) For weight >210 that does not come down, take metolazone  In emergency room June 01, 2022 Lightheaded after metolazone Potassium 2.8 Given 40 potassium 1 pill  Seen  emergency room July failure significant after metolazone Was given fluid, told to skip torsemide for additional day Potassium 2.8  Weight today 209  Feels chest is getting tight again Nervous about taking metolazone as it makes her feel washed out  Taking potassium 10 mill equivalents with every torsemide, numbers have been running low for the past month  No recent hospitalizations for CHF exacerbation past 6 months on torsemide algorithm as above  EKG personally reviewed by myself on todays visit EKG Interpretation Date/Time:  Tuesday August 14 2022 10:21:20 EDT Ventricular Rate:  94 PR Interval:  144 QRS Duration:  78 QT Interval:  376 QTC Calculation: 470 R Axis:   20  Text Interpretation: Normal sinus rhythm Low voltage QRS When compared with ECG of 12-Aug-2022 11:40, No significant change was found Confirmed by Julien Nordmann [phone]) on 08/14/2022 10:47:52 AM   Other past medical history reviewed   Admitted to [facility] 12/2021 worsening shortness of breath, chest and jaw tightness And cardiac catheterization showing stable coronary disease small vessel not amenable to PCI Briefly 2 severely elevated right heart filling pressures  Weight down to 212 today At home 209 Feels her goal weight should be less than 205  Episode last week, after having her evening medications, developed dizziness  while sitting down Could not stand without help, legs gave out Felt out of it, Made it to bed 3 hours later By morning felt better Wonders if it was a TIA, did not want to go to the emergency room Reports feeling fine since that time  Denies any neurologic deficits Did not check her vitals when she had symptoms Sedentary, difficulty moving around  Past medical history reviewed Coronary CTA 07/2021 showed nonobstructive disease with normal FFR Comparison made to cardiac catheterization 2019, no dramatic changes Still with moderate disease in the LAD  Echo 7/22   1. Left ventricular  ejection fraction, by estimation, is 60 to 65%. The  left ventricle has normal function. The left ventricle has no regional  wall motion abnormalities. Left ventricular diastolic parameters are  consistent with Grade II diastolic  dysfunction (pseudonormalization).   2. Right ventricular systolic function is normal. The right ventricular  size is normal. Tricuspid regurgitation signal is inadequate for assessing  PA pressure.   Total chol 120, LDL 48, on Praluent A1C 6.6 Non smoker  Seen in the emergency room with admission July 2022 for angina Stress test performed showing low risk study, Echocardiogram with no acute findings,  It was felt symptoms from GI versus musculoskeletal etiology, unable to exclude stressors  Pacer downloads reviewed,  stable  Other past medical hx reviewed 05/16/2018 left arm pain, etiology unclear Went to the ER, Work up negative  Cath: 10/01/2017 Relatively small LAD with moderate mid vessel disease. Small D1 with 80% ostial/proximal stenosis. Large, dominant LCx without significant disease. Small, non-dominant RCA. Moderately elevated LVEDP. RECOMMENDATIONS: Medical therapy for LAD/diagonal disease.   syncope atrial tachycardia with documented  Pauses.  It was presumed that both events occurred while she was sleeping. --loop monitor placed   Hospitalization for chest pain July 17, 2017 Stress test showing no ischemia, ejection fraction 85% Significant failure to thrive/weakness  Had back surgery 11/14/2015 Went to rehab Got postop infection, ecoli, sepsis  cardiac catheterization 07/25/2011 for chest pain   This showed left dominant coronary system with moderate mid LAD, proximal diagonal #1 and proximal RCA disease all estimated at 50%, normal LV systolic function.   Past Medical History:  Diagnosis Date   Acquired elevated diaphragm    HIGHER ON RIGHT SIDE   Anemia    Anxiety    Aortic atherosclerosis (HCC)    Arthritis    CAD  (coronary artery disease)    a. 07/2017 MV: Low risk; b. 09/2017 Cath: LM nl, LAD 50p/m, D1 80ost, LCX large/nl, RCA small/nl.   Chronic back pain    stenosis.degenerative disc,some scoliosis   Chronic heart failure with preserved ejection fraction (HFpEF) (HCC)    a. 07/2017 Echo: EF 60-65%, no rwma, gr2 DD, nl RV fxn.   Chronic kidney disease    stage 3   Complication of anesthesia 2018   aspirated during wrist surgery during LMA removal   Constipation    takes Stool Softener daily   Coronary artery disease    Depression    takes Cymbalta daily   Diabetes mellitus    Type 2 diabetic. Average fasting blood sugar runs high 170-200   Diastolic CHF (HCC) 07/17/2017   Per patient, diagnosed in 2018   Diverticulosis    Dysplastic nevus 09/12/2017   L sup buttock - mild    Dysplastic nevus 09/18/2018   R sup med buttocks - mild    E coli infection    GERD (gastroesophageal reflux disease)    takes Nexium daily   Grade II diastolic dysfunction    Headache    Hemorrhoids    History of colon polyps    benign   History of gout    doesn't take any meds   History of hiatal hernia  small   History of vertigo    doesn't take any meds   Hyperlipidemia    takes Praluent daily   Hypertension    currently BP medications are on hold    Hypothyroidism    takes Synthroid daily   Insomnia    takes Restoril nightly   Muscle spasm    takes Robaxin as needed   NSVT (nonsustained ventricular tachycardia) (HCC)    OSA on CPAP    Paroxysmal atrial tachycardia    Peripheral vascular disease (HCC)    AAA as stated per pt / was just discovered and pt states has not been referred to vascular MD    Presence of permanent cardiac pacemaker    Restless leg    takes Requip daily   Rosacea    Symptomatic bradycardia    a. 02/2019 s/p Biotronik Edora 8 DR-T DC PPM   Syncope    a. Documented bradycardia and pauses-->02/2019 s/p Biotronik Edora 8 DR-T DC PPM.   Varicose veins    Past Surgical  History:  Procedure Laterality Date   ABDOMINAL HYSTERECTOMY     with BSo   BACK SURGERY     CARDIAC CATHETERIZATION  2013   Normal   CARPAL TUNNEL RELEASE Bilateral    CATARACT EXTRACTION, BILATERAL     CHOLECYSTECTOMY     COLONOSCOPY     COLONOSCOPY WITH PROPOFOL N/A 11/25/2018   Procedure: COLONOSCOPY WITH PROPOFOL;  Surgeon: Toledo, Boykin Nearing, MD;  Location: [facility] ENDOSCOPY;  Service: Gastroenterology;  Laterality: N/A;   DIAGNOSTIC LAPAROSCOPY     multiple times   DILATION AND CURETTAGE OF UTERUS     ESOPHAGOGASTRODUODENOSCOPY (EGD) WITH PROPOFOL N/A 11/01/2014   Procedure: ESOPHAGOGASTRODUODENOSCOPY (EGD) WITH PROPOFOL;  Surgeon: Wallace Cullens, MD;  Location[facility] ENDOSCOPY;  Service: Gastroenterology;  Laterality: N/A;   ESOPHAGOGASTRODUODENOSCOPY (EGD) WITH PROPOFOL N/A 11/25/2018   Procedure: ESOPHAGOGASTRODUODENOSCOPY (EGD) WITH PROPOFOL;  Surgeon: Toledo, Boykin Nearing, MD;  Location: [facility] ENDOSCOPY;  Service: Gastroenterology;  Laterality: N/A;   ESOPHAGOGASTRODUODENOSCOPY (EGD) WITH PROPOFOL N/A 07/11/2022   Procedure: ESOPHAGOGASTRODUODENOSCOPY (EGD) WITH PROPOFOL;  Surgeon: Toledo, Boykin Nearing, MD;  Location: [facility] ENDOSCOPY;  Service: Gastroenterology;  Laterality: N/A;  IDDM   HARDWARE REMOVAL Left 10/11/2016   Procedure: HARDWARE REMOVAL-LEFT RADIUS;  Surgeon: Lyndle Herrlich, MD;  Location: [facility] ORS;  Service: Orthopedics;  Laterality: Left;  Left Radius Wrist    IMPLANTABLE CONTACT LENS IMPLANTATION     bilateral   KNEE ARTHROSCOPY Right 05/09/2020   Procedure: ARTHROSCOPY KNEE;  Surgeon: Donato Heinz, MD;  Location: [facility] ORS;  Service: Orthopedics;  Laterality: Right;   LAMINECTOMY  11/13/2015   LEFT HEART CATH AND CORONARY ANGIOGRAPHY Left 10/01/2017   Procedure: LEFT HEART CATH AND CORONARY ANGIOGRAPHY;  Surgeon: Yvonne Kendall, MD;  Location: [facility] INVASIVE CV LAB;  Service: Cardiovascular;  Laterality: Left;   LOOP RECORDER INSERTION N/A 10/17/2017   Procedure: LOOP  RECORDER INSERTION;  Surgeon: Duke Salvia, MD;  Location: [facility] INVASIVE CV LAB;  Service: Cardiovascular;  Laterality: N/A;   LOOP RECORDER REMOVAL N/A 03/04/2019   Procedure: LOOP RECORDER REMOVAL;  Surgeon: Duke Salvia, MD;  Location: [facility] INVASIVE CV LAB;  Service: Cardiovascular;  Laterality: N/A;   LUMBAR FUSION  11/2015   LUMBAR WOUND DEBRIDEMENT N/A 12/02/2015   Procedure: WOUND Exploration;  Surgeon: Lisbeth Renshaw, MD;  Location: [facility] OR;  Service: Neurosurgery;  Laterality: N/A;   OPEN REDUCTION INTERNAL FIXATION (ORIF) DISTAL RADIAL FRACTURE Left 08/29/2016   Procedure: OPEN  REDUCTION INTERNAL FIXATION (ORIF) DISTAL RADIAL FRACTURE;  Surgeon: Lyndle Herrlich, MD;  Location: [facility] ORS;  Service: Orthopedics;  Laterality: Left;   PACEMAKER IMPLANT N/A 03/04/2019   Procedure: PACEMAKER IMPLANT;  Surgeon: Duke Salvia, MD;  Location: [facility] INVASIVE CV LAB;  Service: Cardiovascular;  Laterality: N/A;   PICC LINE PLACE PERIPHERAL ([facility] HX)     right upper arm    RIGHT/LEFT HEART CATH AND CORONARY ANGIOGRAPHY N/A 12/26/2021   Procedure: RIGHT/LEFT HEART CATH AND CORONARY ANGIOGRAPHY;  Surgeon: Yvonne Kendall, MD;  Location: [facility] INVASIVE CV LAB;  Service: Cardiovascular;  Laterality: N/A;   ROTATOR CUFF REPAIR Right    SAVORY DILATION N/A 11/01/2014   Procedure: SAVORY DILATION;  Surgeon: Wallace Cullens, MD;  Location: [facility] ENDOSCOPY;  Service: Gastroenterology;  Laterality: N/A;   TONSILLECTOMY     TRIGGER FINGER RELEASE Bilateral      Current Meds  Medication Sig   Acetaminophen (TYLENOL ARTHRITIS PAIN PO) Take 1,300 mg by mouth every 8 (eight) hours as needed (pain).   albuterol (PROVENTIL) (2.5 MG/3ML) 0.083% nebulizer solution Take 3 mLs (2.5 mg total) by nebulization every 4 (four) hours as needed for wheezing or shortness of breath.   albuterol (VENTOLIN HFA) 108 (90 Base) MCG/ACT inhaler Inhale 1 puff into the lungs every 4 (four) hours as needed.   aspirin 81 MG tablet Take  81 mg by mouth at bedtime.    atenolol (TENORMIN) 25 MG tablet Take 1 tablet (25 mg total) by mouth every morning.   Azelaic Acid (FINACEA) 15 % FOAM Apply 1 application. topically as directed. Qd to bid to aa face   azelastine (ASTELIN) 0.1 % nasal spray Place 2 sprays into both nostrils 2 (two) times daily as needed for rhinitis.   cholecalciferol (VITAMIN D3) 25 MCG (1000 UNIT) tablet Take 1,000 Units by mouth daily.   diclofenac Sodium (VOLTAREN) 1 % GEL Apply 2 g topically 2 (two) times daily.   doxycycline (ORACEA) 40 MG capsule Take 1 capsule (40 mg total) by mouth every morning.   doxycycline (PERIOSTAT) 20 MG tablet Take 1 tablet (20 mg total) by mouth 2 (two) times daily.   empagliflozin (JARDIANCE) 10 MG TABS tablet Take 10 mg by mouth daily.   escitalopram (LEXAPRO) 5 MG tablet Take 5 mg by mouth at bedtime.   esomeprazole (NEXIUM) 40 MG capsule Take 40 mg by mouth 2 (two) times daily.   gabapentin (NEURONTIN) 300 MG capsule Take 300 mg by mouth 2 (two) times daily.   Insulin Aspart (NOVOLOG FLEXPEN [place]) Inject 10 Units into the skin as needed (diabetes).   LANTUS SOLOSTAR 100 UNIT/ML Solostar Pen Inject 28 Units into the skin every morning.   levothyroxine (SYNTHROID, LEVOTHROID) 75 MCG tablet Take 75 mcg by mouth daily before breakfast.    methocarbamol (ROBAXIN) 500 MG tablet Take 500 mg by mouth every 8 (eight) hours as needed for muscle spasms.   metolazone (ZAROXOLYN) 2.5 MG tablet Take 2.5 mg by mouth daily as needed (fluid overload).   mometasone (ELOCON) 0.1 % cream Apply 1 application topically daily as needed (Rash). Qd to bid up to 5 days a week aa right lower leg prn itching   nitroGLYCERIN (NITROSTAT) 0.4 MG SL tablet Place 1 tablet (0.4 mg total) under the tongue every 5 (five) minutes as needed for chest pain.   ondansetron (ZOFRAN) 4 MG tablet Take 4 mg by mouth every 8 (eight) hours as needed for nausea or vomiting.    potassium  chloride (KLOR-CON) 10 MEQ tablet Take  2 tablet (20 meq) by mouth twice daily as directed   PRALUENT 150 MG/ML SOAJ INJECT 150 MG UNDER THE SKIN EVERY 14 DAYS   ranolazine (RANEXA) 500 MG 12 hr tablet Take 2 tablets (1,000 mg total) by mouth 2 (two) times daily.   ropinirole (REQUIP) 5 MG tablet Take 5 mg by mouth at bedtime.   temazepam (RESTORIL) 30 MG capsule Take 30 mg by mouth at bedtime.   torsemide (DEMADEX) 20 MG tablet Take 2 tablets (40 mg total) by mouth 2 (two) times daily. As directed   traMADol (ULTRAM) 50 MG tablet Take 50 mg by mouth every 6 (six) hours as needed for moderate pain or severe pain.   triamcinolone cream (KENALOG) 0.1 % Apply to affected skin qd-bid 5 days a week prn flares. Avoid applying to face, groin, and axilla. Use as directed. Long-term use can cause thinning of the skin.   trolamine salicylate (ASPERCREME) 10 % cream Apply 1 application topically at bedtime.     Allergies:   Ezetimibe, Hydrocodone-acetaminophen, Metoclopramide, Propoxyphene, Cefuroxime, Hydrocodone, Tramadol, Ceftin [cefuroxime axetil], Codeine, Penicillins, Statins, and Vicodin [hydrocodone-acetaminophen]   Social History   Tobacco Use   Smoking status: Never   Smokeless tobacco: Never  Vaping Use   Vaping Use: Never used  Substance Use Topics   Alcohol use: No   Drug use: No     Family Hx: The patient's family history includes Breast cancer (age of onset: 66) in her mother; Heart attack in her sister; Heart disease in her brother, father, mother, and sister.  ROS:   Please see the history of present illness.    Review of Systems  Constitutional: Negative.   HENT: Negative.    Respiratory:  Positive for shortness of breath.   Cardiovascular: Negative.   Gastrointestinal: Negative.   Musculoskeletal: Negative.   Neurological: Negative.   Psychiatric/Behavioral: Negative.    All other systems reviewed and are negative.    Labs/Other Tests and Data Reviewed:    Recent Labs: 12/24/2021: B Natriuretic Peptide  21.3 06/01/2022: ALT 15; Magnesium 2.5 08/12/2022: BUN 22; Creatinine, Ser 1.29; Hemoglobin 11.4; Platelets 247; Potassium 2.8; Sodium 136   Recent Lipid Panel Lab Results  Component Value Date/Time   CHOL 163 09/08/2020 06:13 AM   CHOL 163 07/26/2015 08:02 AM   TRIG 192 (H) 09/08/2020 06:13 AM   HDL 44 09/08/2020 06:13 AM   HDL 32 (L) 07/26/2015 08:02 AM   CHOLHDL 3.7 09/08/2020 06:13 AM   LDLCALC 81 09/08/2020 06:13 AM   LDLCALC 94 07/26/2015 08:02 AM    Wt Readings from Last 3 Encounters:  08/14/22 209 lb 8 oz (95 kg)  08/12/22 204 lb (92.5 kg)  07/11/22 209 lb 6.4 oz (95 kg)     Exam:    Vital Signs: Vital signs may also be detailed in the HPI BP (!) 150/60 (BP Location: Left Arm, Patient Position: Sitting, Cuff Size: Normal)   Pulse 94   Ht 5\' 1"  (1.549 m)   Wt 209 lb 8 oz (95 kg)   SpO2 97%   BMI 39.58 kg/m   Constitutional:  oriented to person, place, and time. No distress.  HENT:  Head: Grossly normal Eyes:  no discharge. No scleral icterus.  Neck: No JVD, no carotid bruits  Cardiovascular: Regular rate and rhythm, no murmurs appreciated Pulmonary/Chest: Clear to auscultation bilaterally, no wheezes or rails Abdominal: Soft.  no distension.  no tenderness.  Musculoskeletal: Normal range of  motion Neurological:  normal muscle tone. Coordination normal. No atrophy Skin: Skin warm and dry Psychiatric: normal affect, pleasant  ASSESSMENT & PLAN:    Atherosclerosis of native coronary artery with stable angina pectoris, unspecified whether native or transplanted heart [facility]) Hospital admission for anginal symptoms of shortness of breath Cardiac catheterization November 2023, medical management recommended Cholesterol   Type 2 diabetes mellitus with stage 3 chronic kidney disease, with long-term current use of insulin (HCC) Exercise Limited by back pain A1c 6.2, in general is relatively well  Atrial tachycardia (HCC) Followed by Dr. Dellie Burns downloads  reviewed Nonsustained VT summer 2023 low arrhythmia  Chronic diastolic heart failure (HCC) Recommend IV diuretic regimen as below: For weight <205, stay on torsemide 40 daily take with 30 mill equivalents potassium For weight >205 to 210, torsemide 40 in Am, extra torsemide 20 mg after lunch For weight >210, take torsemide 40 mg twice a day with 50 mill equivalents potassium For weight >213 that does not come down, take metolazone 1/2 pill with 60 mill equivalents potassium  Sinus node dysfunction (HCC) Followed by Dr. Graciela Husbands Has pacer months reviewed  Lymphedema Stable sx, minimal Previously required compression pumps,   Essential hypertension Blood pressure high end of her range, recommend she closely monitor her numbers at home  OSA on CPAP Continues to use her CPAP  Syncope Pacemaker in place Previous episodes in the setting of nausea vomiting, vasovagal No recent sx  Weakness Episodes of weakness after metolazone, will reduce dose in half down to 1.25 mg  CRI Discussed, avoid NSAIDs  Stable creatinine 1.29, GFR 43   Total encounter time more than 40 minutes  Greater than 50% was spent in counseling and coordination of care with the patient    Signed, Julien Nordmann, MD  08/14/2022 10:47 AM    [facility] [address], [place], [place] [address]

## 2022-08-14 ENCOUNTER — Encounter: Payer: Self-pay | Admitting: Cardiovascular Disease

## 2022-08-14 ENCOUNTER — Ambulatory Visit: Payer: Medicare Other | Attending: Cardiovascular Disease | Admitting: Cardiovascular Disease

## 2022-08-14 VITALS — BP 150/60 | HR 94 | Ht 61.0 in | Wt 209.5 lb

## 2022-08-14 DIAGNOSIS — I10 Essential (primary) hypertension: Secondary | ICD-10-CM | POA: Diagnosis not present

## 2022-08-14 DIAGNOSIS — I25118 Atherosclerotic heart disease of native coronary artery with other forms of angina pectoris: Secondary | ICD-10-CM | POA: Insufficient documentation

## 2022-08-14 DIAGNOSIS — I4729 Other ventricular tachycardia: Secondary | ICD-10-CM | POA: Diagnosis not present

## 2022-08-14 DIAGNOSIS — J984 Other disorders of lung: Secondary | ICD-10-CM | POA: Diagnosis present

## 2022-08-14 DIAGNOSIS — I4719 Other supraventricular tachycardia: Secondary | ICD-10-CM | POA: Insufficient documentation

## 2022-08-14 DIAGNOSIS — E785 Hyperlipidemia, unspecified: Secondary | ICD-10-CM | POA: Insufficient documentation

## 2022-08-14 DIAGNOSIS — I5032 Chronic diastolic (congestive) heart failure: Secondary | ICD-10-CM | POA: Insufficient documentation

## 2022-08-14 MED ORDER — POTASSIUM CHLORIDE ER 10 MEQ PO TBCR: EXTENDED_RELEASE_TABLET | ORAL | 3 refills | Status: DC

## 2022-08-14 NOTE — Patient Instructions (Addendum)
Medication Instructions:  Take 20 meq potassium with each torsemide   Take 1/2 metolazone for weight 213 or higher When taking metolazone, take 2 more potassium  If you need a refill on your cardiac medications before your next appointment, please call your pharmacy.   Lab work: No new labs needed  Testing/Procedures: No new testing needed  Follow-Up: At [facility], you and your health needs are our priority.  As part of our continuing mission to provide you with exceptional heart care, we have created designated Provider Care Teams.  These Care Teams include your primary Cardiologist (physician) and Advanced Practice Providers (APPs -  Physician Assistants and Nurse Practitioners) who all work together to provide you with the care you need, when you need it.  You will need a follow up appointment in 3 months  Providers on your designated Care Team:   Nicolasa Ducking, NP Eula Listen, PA-C Cadence Fransico Michael, [place]  COVID-19 Vaccine Information can be found at: [email or web address] For questions related to vaccine distribution or appointments, please email [email or web address] or call [phone].

## 2022-08-24 ENCOUNTER — Other Ambulatory Visit: Payer: Self-pay | Admitting: Cardiovascular Disease

## 2022-08-27 ENCOUNTER — Ambulatory Visit: Payer: Medicare Other | Admitting: Cardiovascular Disease

## 2022-08-29 ENCOUNTER — Ambulatory Visit (INDEPENDENT_AMBULATORY_CARE_PROVIDER_SITE_OTHER): Payer: Medicare Other

## 2022-08-29 DIAGNOSIS — I5032 Chronic diastolic (congestive) heart failure: Secondary | ICD-10-CM | POA: Diagnosis not present

## 2022-08-29 LAB — CUP PACEART REMOTE DEVICE CHECK
Battery Remaining Percentage: 75 %
Brady Statistic RA Percent Paced: 29 %
Brady Statistic RV Percent Paced: 0 %
Date Time Interrogation Session: 20240717074321
Implantable Lead Connection Status: 753985
Implantable Lead Connection Status: 753985
Implantable Lead Implant Date: 20210120
Implantable Lead Implant Date: 20210120
Implantable Lead Location: 753859
Implantable Lead Location: 753860
Implantable Lead Model: 5076
Implantable Lead Model: 5076
Implantable Pulse Generator Implant Date: 20210120
Lead Channel Impedance Value: 351 Ohm
Lead Channel Impedance Value: 741 Ohm
Lead Channel Pacing Threshold Amplitude: 0.4 V
Lead Channel Pacing Threshold Amplitude: 0.5 V
Lead Channel Pacing Threshold Pulse Width: 0.4 ms
Lead Channel Pacing Threshold Pulse Width: 0.4 ms
Lead Channel Sensing Intrinsic Amplitude: 2.9 mV
Lead Channel Sensing Intrinsic Amplitude: 6.1 mV
Lead Channel Setting Pacing Amplitude: 3 V
Lead Channel Setting Pacing Amplitude: 3 V
Lead Channel Setting Pacing Pulse Width: 0.4 ms
Pulse Gen Model: 407145
Pulse Gen Serial Number: 69765687

## 2022-09-13 NOTE — Progress Notes (Signed)
Remote pacemaker transmission.   

## 2022-09-27 ENCOUNTER — Ambulatory Visit: Payer: Medicare Other | Admitting: Dermatology

## 2022-09-27 VITALS — BP 125/60

## 2022-09-27 DIAGNOSIS — Z7189 Other specified counseling: Secondary | ICD-10-CM

## 2022-09-27 DIAGNOSIS — L209 Atopic dermatitis, unspecified: Secondary | ICD-10-CM | POA: Diagnosis not present

## 2022-09-27 DIAGNOSIS — D692 Other nonthrombocytopenic purpura: Secondary | ICD-10-CM

## 2022-09-27 DIAGNOSIS — Z79899 Other long term (current) drug therapy: Secondary | ICD-10-CM

## 2022-09-27 DIAGNOSIS — L2089 Other atopic dermatitis: Secondary | ICD-10-CM

## 2022-09-27 DIAGNOSIS — L578 Other skin changes due to chronic exposure to nonionizing radiation: Secondary | ICD-10-CM | POA: Diagnosis not present

## 2022-09-27 DIAGNOSIS — L719 Rosacea, unspecified: Secondary | ICD-10-CM

## 2022-09-27 DIAGNOSIS — W908XXA Exposure to other nonionizing radiation, initial encounter: Secondary | ICD-10-CM

## 2022-09-27 MED ORDER — DOXYCYCLINE 40 MG PO CPDR: 40.0000 mg | DELAYED_RELEASE_CAPSULE | Freq: Every day | ORAL | 4 refills | Status: DC

## 2022-09-27 MED ORDER — EUCRISA 2 % EX OINT: 1.0000 | TOPICAL_OINTMENT | CUTANEOUS | 2 refills | Status: AC

## 2022-09-27 MED ORDER — IVERMECTIN 1 % EX CREA: 1.0000 | TOPICAL_CREAM | CUTANEOUS | 11 refills | Status: DC

## 2022-09-27 NOTE — Progress Notes (Signed)
Follow-Up Visit   Subjective  [name] is a 77 y.o. female who presents for the following: Rosacea face, 68m f/u, Finacea foam qd, Doxycycline 40mg  1 po qd, Atopic Derm back, TMC 0.1% qhs The patient has spots, moles and lesions to be evaluated, some may be new or changing and the patient may have concern these could be cancer.  The following portions of the chart were reviewed this encounter and updated as appropriate: medications, allergies, medical history  Review of Systems:  No other skin or systemic complaints except as noted in HPI or Assessment and Plan.  Objective  Well appearing patient in no apparent distress; mood and affect are within normal limits.  A focused examination was performed of the following areas: Face, back  Relevant exam findings are noted in the Assessment and Plan.   Assessment & Plan   ROSACEA face Exam pap R cheek, stye L lower eyelid  Chronic and persistent condition with duration or expected duration over one year. Condition is bothersome/symptomatic for patient. Currently flared.  Rosacea is a chronic progressive skin condition usually affecting the face of adults, causing redness and/or acne bumps. It is treatable but not curable. It sometimes affects the eyes (ocular rosacea) as well. It may respond to topical and/or systemic medication and can flare with stress, sun exposure, alcohol, exercise, topical steroids (including hydrocortisone/cortisone 10) and some foods.  Daily application of broad spectrum spf 30+ sunscreen to face is recommended to reduce flares.   Treatment Plan Cont Doxycyline 40mg  1 po qd with food and drink ( pt will restart Doxycyline 40mg  after she finishes her current Doxycyline 100mg  bid for stye) Start Joesoef's Sulfur Soap Start SM Triple Cream qd/bid D/C Finacea foam  May consider SM Sulfa Wash in future if Joesoef's sulfur soap does not help Long term medication management.  Patient is using long term (months  to years) prescription medication  to control their dermatologic condition.  These medications require periodic monitoring to evaluate for efficacy and side effects and may require periodic laboratory monitoring.   ATOPIC DERMATITIS back Exam: excoriations back with scale 5% BSA  Chronic and persistent condition with duration or expected duration over one year. Condition is symptomatic/ bothersome to patient. Not currently at goal.   Atopic dermatitis (eczema) is a chronic, relapsing, pruritic condition that can significantly affect quality of life. It is often associated with allergic rhinitis and/or asthma and can require treatment with topical medications, phototherapy, or in severe cases biologic injectable medication (Dupixent; Adbry) or Oral JAK inhibitors.  Treatment Plan: D/C TMC Start Eucrisa ointment qd/bid to aa eczema  If Eucrisa ointment not covered can send in Tacrolimus ointment 0.1%  Recommend gentle skin care.   Purpura - Chronic; persistent and recurrent.  Treatable, but not curable. - Violaceous macules and patches - Benign - Related to trauma, age, sun damage and/or use of blood thinners, chronic use of topical and/or oral steroids - Observe - Can use OTC arnica containing moisturizer such as Dermend Bruise Formula if desired - Call for worsening or other concerns  Rosacea  Other atopic dermatitis  Counseling and coordination of care  Medication management  Actinic skin damage  Purpura (HCC)   ACTINIC DAMAGE - chronic, secondary to cumulative UV radiation exposure/sun exposure over time - diffuse scaly erythematous macules with underlying dyspigmentation - Recommend daily broad spectrum sunscreen SPF 30+ to sun-exposed areas, reapply every 2 hours as needed.  - Recommend staying in the shade or wearing long sleeves,  sun glasses (UVA+UVB protection) and wide brim hats (4-inch brim around the entire circumference of the hat). - Call for new or changing  lesions.  Return in about 6 months (around 03/30/2023) for Atopic Derm f/u, Rosacea f/u.  I, Ardis Rowan, RMA, am acting as scribe for Armida Sans, MD .  Documentation: I have reviewed the above documentation for accuracy and completeness, and I agree with the above.  Armida Sans, MD

## 2022-09-27 NOTE — Patient Instructions (Addendum)
Start Joesoef's Sulfur Soap can order off [employer] to wash face  Start [facility] Triple cream 1 to 2 times a day to face for Rosacea Stop Finacea foam  Stop Triamcinoline cream for eczema on back Start Eucrisa ointment 1 to 2 times a day to back for eczema as needed for flares  Instructions for [facility] Medications  One or more of your medications was sent to the [facility] mail order compounding pharmacy. You will receive an email from them and can purchase the medicine through that link. It will then be mailed to your home at the address you confirmed. If for any reason you do not receive an email from them, please check your spam folder. If you still do not find the email, please let us know. [facility] phone number is [phone].      Due to recent changes in healthcare laws, you may see results of your pathology and/or laboratory studies on MyChart before the doctors have had a chance to review them. We understand that in some cases there may be results that are confusing or concerning to you. Please understand that not all results are received at the same time and often the doctors may need to interpret multiple results in order to provide you with the best plan of care or course of treatment. Therefore, we ask that you please give [place] 2 business days to thoroughly review all your results before contacting the office for clarification. Should we see a critical lab result, you will be contacted sooner.   If You Need Anything After Your Visit  If you have any questions or concerns for your doctor, please call our main line at [phone] and press option 4 to reach your doctor's medical assistant. If no one answers, please leave a voicemail as directed and we will return your call as soon as possible. Messages left after 4 pm will be answered the following business day.   You may also send [place] a message via MyChart. We typically respond to MyChart messages within 1-2  business days.  For prescription refills, please ask your pharmacy to contact our office. Our fax number is [phone].  If you have an urgent issue when the clinic is closed that cannot wait until the next business day, you can page your doctor at the number below.    Please note that while we do our best to be available for urgent issues outside of office hours, we are not available 24/7.   If you have an urgent issue and are unable to reach [place], you may choose to seek medical care at your doctor's office, retail clinic, urgent care center, or emergency room.  If you have a medical emergency, please immediately call 911 or go to the emergency department.  Pager Numbers  - Dr. Gwen Pounds: [phone]  - Dr. Roseanne Reno: [phone]  - Dr. Katrinka Blazing: [phone]   In the event of inclement weather, please call our main line at [phone] for an update on the status of any delays or closures.  Dermatology Medication Tips: Please keep the boxes that topical medications come in in order to help keep track of the instructions about where and how to use these. Pharmacies typically print the medication instructions only on the boxes and not directly on the medication tubes.   If your medication is too expensive, please contact our office at [phone] option 4 or send [place] a message through MyChart.   We are unable to tell what your co-pay  for medications will be in advance as this is different depending on your insurance coverage. However, we may be able to find a substitute medication at lower cost or fill out paperwork to get insurance to cover a needed medication.   If a prior authorization is required to get your medication covered by your insurance company, please allow [place] 1-2 business days to complete this process.  Drug prices often vary depending on where the prescription is filled and some pharmacies may offer cheaper prices.  The website www.goodrx.com contains coupons for  medications through different pharmacies. The prices here do not account for what the cost may be with help from insurance (it may be cheaper with your insurance), but the website can give you the price if you did not use any insurance.  - You can print the associated coupon and take it with your prescription to the pharmacy.  - You may also stop by our office during regular business hours and pick up a GoodRx coupon card.  - If you need your prescription sent electronically to a different pharmacy, notify our office through [facility] or by phone at [phone] option 4.     Si Usted Necesita Algo Despus de Su Visita  Tambin puede enviarnos un mensaje a travs de Clinical cytogeneticist. Por lo general respondemos a los mensajes de MyChart en el transcurso de 1 a 2 das hbiles.  Para renovar recetas, por favor pida a su farmacia que se ponga en contacto con nuestra oficina. Annie Sable de fax es [place] [phone].  Si tiene un asunto urgente cuando la clnica est cerrada y que no puede esperar hasta el siguiente da hbil, puede llamar/localizar a su doctor(a) al nmero que aparece a continuacin.   Por favor, tenga en cuenta que aunque hacemos todo lo posible para estar disponibles para asuntos urgentes fuera del horario de [place], no estamos disponibles las 24 horas del da, los 7 [address]  [address] de [place].   Si tiene un problema urgente y no puede comunicarse con nosotros, puede optar por buscar atencin mdica  en el consultorio de su doctor(a), en una clnica privada, en un centro de atencin urgente o en una sala de emergencias.  Si tiene Engineer, drilling, por favor llame inmediatamente al 911 o vaya a la sala de emergencias.  Nmeros de bper  - Dr. Gwen Pounds: [phone]  - Dra. Roseanne Reno: [phone]  - Dr. Katrinka Blazing: [phone]   En caso de inclemencias del tiempo, por favor llame a Lacy Duverney principal al [phone] para una actualizacin sobre el [place] de cualquier retraso  o cierre.  Consejos para la medicacin en dermatologa: Por favor, guarde las cajas en las que vienen los medicamentos de uso tpico para ayudarle a seguir las instrucciones sobre dnde y cmo usarlos. Las farmacias generalmente imprimen las instrucciones del medicamento slo en las cajas y no directamente en los tubos del [place].   Si su medicamento es muy caro, por favor, pngase en contacto con Rolm [place] llamando al [phone] y presione la opcin 4 o envenos un mensaje a travs de Clinical cytogeneticist.   No podemos decirle cul ser su copago por los medicamentos por adelantado ya que esto es diferente dependiendo de la cobertura de su seguro. Sin embargo, es posible que podamos encontrar un medicamento sustituto a Audiological scientist un formulario para que el seguro cubra el medicamento que se considera necesario.   Si se requiere una autorizacin previa para que su compaa de seguros [place] su medicamento, por  favor permtanos de 1 a 2 das hbiles para completar [address]  Los precios de los medicamentos varan con frecuencia dependiendo del Environmental consultant de dnde se surte la receta y alguna farmacias pueden ofrecer precios ms baratos.  El sitio web www.goodrx.com tiene cupones para medicamentos de Health and safety inspector. Los precios aqu no tienen en cuenta lo que podra costar con la ayuda del seguro (puede ser ms barato con su seguro), pero el sitio web puede darle el precio si no utiliz Tourist information centre manager.  - Puede imprimir el cupn correspondiente y llevarlo con su receta a la farmacia.  - Tambin puede pasar por nuestra oficina durante el horario de atencin regular y Education officer, museum una tarjeta de cupones de GoodRx.  - Si necesita que su receta se enve electrnicamente a una farmacia diferente, informe a nuestra oficina a travs de MyChart de [place] o por telfono llamando al [phone] y presione la opcin 4.

## 2022-10-07 ENCOUNTER — Encounter: Payer: Self-pay | Admitting: Dermatology

## 2022-10-29 ENCOUNTER — Other Ambulatory Visit: Payer: Self-pay

## 2022-10-29 ENCOUNTER — Emergency Department: Payer: Medicare Other

## 2022-10-29 ENCOUNTER — Inpatient Hospital Stay
Admission: EM | Admit: 2022-10-29 | Discharge: 2022-10-31 | DRG: 178 | Disposition: A | Discharge status: Home / Self Care | Payer: Medicare Other | Attending: Internal Medicine | Admitting: Internal Medicine

## 2022-10-29 DIAGNOSIS — E1122 Type 2 diabetes mellitus with diabetic chronic kidney disease: Secondary | ICD-10-CM | POA: Diagnosis present

## 2022-10-29 DIAGNOSIS — R001 Bradycardia, unspecified: Secondary | ICD-10-CM | POA: Diagnosis present

## 2022-10-29 DIAGNOSIS — R5381 Other malaise: Secondary | ICD-10-CM | POA: Diagnosis present

## 2022-10-29 DIAGNOSIS — Z9079 Acquired absence of other genital organ(s): Secondary | ICD-10-CM

## 2022-10-29 DIAGNOSIS — E039 Hypothyroidism, unspecified: Secondary | ICD-10-CM | POA: Diagnosis present

## 2022-10-29 DIAGNOSIS — E662 Morbid (severe) obesity with alveolar hypoventilation: Secondary | ICD-10-CM | POA: Diagnosis present

## 2022-10-29 DIAGNOSIS — Z9842 Cataract extraction status, left eye: Secondary | ICD-10-CM

## 2022-10-29 DIAGNOSIS — I10 Essential (primary) hypertension: Secondary | ICD-10-CM | POA: Diagnosis present

## 2022-10-29 DIAGNOSIS — R531 Weakness: Secondary | ICD-10-CM

## 2022-10-29 DIAGNOSIS — E1140 Type 2 diabetes mellitus with diabetic neuropathy, unspecified: Secondary | ICD-10-CM | POA: Diagnosis present

## 2022-10-29 DIAGNOSIS — I130 Hypertensive heart and chronic kidney disease with heart failure and stage 1 through stage 4 chronic kidney disease, or unspecified chronic kidney disease: Secondary | ICD-10-CM | POA: Diagnosis present

## 2022-10-29 DIAGNOSIS — E1151 Type 2 diabetes mellitus with diabetic peripheral angiopathy without gangrene: Secondary | ICD-10-CM | POA: Diagnosis present

## 2022-10-29 DIAGNOSIS — Z79890 Hormone replacement therapy (postmenopausal): Secondary | ICD-10-CM

## 2022-10-29 DIAGNOSIS — Z8249 Family history of ischemic heart disease and other diseases of the circulatory system: Secondary | ICD-10-CM

## 2022-10-29 DIAGNOSIS — K5900 Constipation, unspecified: Secondary | ICD-10-CM | POA: Diagnosis present

## 2022-10-29 DIAGNOSIS — Z888 Allergy status to other drugs, medicaments and biological substances status: Secondary | ICD-10-CM

## 2022-10-29 DIAGNOSIS — E1165 Type 2 diabetes mellitus with hyperglycemia: Secondary | ICD-10-CM | POA: Diagnosis present

## 2022-10-29 DIAGNOSIS — I5032 Chronic diastolic (congestive) heart failure: Secondary | ICD-10-CM | POA: Diagnosis present

## 2022-10-29 DIAGNOSIS — Z961 Presence of intraocular lens: Secondary | ICD-10-CM | POA: Diagnosis present

## 2022-10-29 DIAGNOSIS — E119 Type 2 diabetes mellitus without complications: Secondary | ICD-10-CM

## 2022-10-29 DIAGNOSIS — Z9841 Cataract extraction status, right eye: Secondary | ICD-10-CM

## 2022-10-29 DIAGNOSIS — G4733 Obstructive sleep apnea (adult) (pediatric): Secondary | ICD-10-CM

## 2022-10-29 DIAGNOSIS — Z9049 Acquired absence of other specified parts of digestive tract: Secondary | ICD-10-CM

## 2022-10-29 DIAGNOSIS — G2581 Restless legs syndrome: Secondary | ICD-10-CM | POA: Diagnosis present

## 2022-10-29 DIAGNOSIS — I2510 Atherosclerotic heart disease of native coronary artery without angina pectoris: Secondary | ICD-10-CM | POA: Diagnosis present

## 2022-10-29 DIAGNOSIS — Z981 Arthrodesis status: Secondary | ICD-10-CM

## 2022-10-29 DIAGNOSIS — Z7984 Long term (current) use of oral hypoglycemic drugs: Secondary | ICD-10-CM

## 2022-10-29 DIAGNOSIS — U071 COVID-19: Principal | ICD-10-CM | POA: Diagnosis present

## 2022-10-29 DIAGNOSIS — Z79899 Other long term (current) drug therapy: Secondary | ICD-10-CM

## 2022-10-29 DIAGNOSIS — J9601 Acute respiratory failure with hypoxia: Secondary | ICD-10-CM | POA: Diagnosis not present

## 2022-10-29 DIAGNOSIS — Z950 Presence of cardiac pacemaker: Secondary | ICD-10-CM

## 2022-10-29 DIAGNOSIS — I493 Ventricular premature depolarization: Secondary | ICD-10-CM | POA: Diagnosis not present

## 2022-10-29 DIAGNOSIS — F32A Depression, unspecified: Secondary | ICD-10-CM | POA: Diagnosis present

## 2022-10-29 DIAGNOSIS — Z90710 Acquired absence of both cervix and uterus: Secondary | ICD-10-CM

## 2022-10-29 DIAGNOSIS — Z6836 Body mass index (BMI) 36.0-36.9, adult: Secondary | ICD-10-CM

## 2022-10-29 DIAGNOSIS — Z90722 Acquired absence of ovaries, bilateral: Secondary | ICD-10-CM

## 2022-10-29 DIAGNOSIS — Z794 Long term (current) use of insulin: Secondary | ICD-10-CM

## 2022-10-29 DIAGNOSIS — J984 Other disorders of lung: Secondary | ICD-10-CM | POA: Diagnosis present

## 2022-10-29 DIAGNOSIS — Z86010 Personal history of colonic polyps: Secondary | ICD-10-CM

## 2022-10-29 DIAGNOSIS — Z885 Allergy status to narcotic agent status: Secondary | ICD-10-CM

## 2022-10-29 DIAGNOSIS — Z880 Allergy status to penicillin: Secondary | ICD-10-CM

## 2022-10-29 DIAGNOSIS — E876 Hypokalemia: Secondary | ICD-10-CM | POA: Diagnosis present

## 2022-10-29 DIAGNOSIS — I714 Abdominal aortic aneurysm, without rupture, unspecified: Secondary | ICD-10-CM | POA: Diagnosis present

## 2022-10-29 DIAGNOSIS — Z803 Family history of malignant neoplasm of breast: Secondary | ICD-10-CM

## 2022-10-29 DIAGNOSIS — Z7982 Long term (current) use of aspirin: Secondary | ICD-10-CM

## 2022-10-29 DIAGNOSIS — E785 Hyperlipidemia, unspecified: Secondary | ICD-10-CM | POA: Diagnosis present

## 2022-10-29 DIAGNOSIS — G8929 Other chronic pain: Secondary | ICD-10-CM | POA: Diagnosis present

## 2022-10-29 DIAGNOSIS — F419 Anxiety disorder, unspecified: Secondary | ICD-10-CM | POA: Diagnosis present

## 2022-10-29 DIAGNOSIS — M79661 Pain in right lower leg: Secondary | ICD-10-CM | POA: Diagnosis present

## 2022-10-29 DIAGNOSIS — N1831 Chronic kidney disease, stage 3a: Secondary | ICD-10-CM | POA: Diagnosis present

## 2022-10-29 LAB — TROPONIN I (HIGH SENSITIVITY): Troponin I (High Sensitivity): 5 ng/L (ref <18)

## 2022-10-29 LAB — CBC WITH DIFFERENTIAL/PLATELET
Abs Immature Granulocytes: 0.02 10*3/uL (ref 0.00–0.07)
Basophils Absolute: 0 10*3/uL (ref 0.0–0.1)
Basophils Relative: 0 %
Eosinophils Absolute: 0.1 10*3/uL (ref 0.0–0.5)
Eosinophils Relative: 1 %
HCT: 39.8 % (ref 36.0–46.0)
Hemoglobin: 12.2 g/dL (ref 12.0–15.0)
Immature Granulocytes: 0 %
Lymphocytes Relative: 36 %
Lymphs Abs: 2.5 10*3/uL (ref 0.7–4.0)
MCH: 25.3 pg — ABNORMAL LOW (ref 26.0–34.0)
MCHC: 30.7 g/dL (ref 30.0–36.0)
MCV: 82.4 fL (ref 80.0–100.0)
Monocytes Absolute: 0.4 10*3/uL (ref 0.1–1.0)
Monocytes Relative: 6 %
Neutro Abs: 3.9 10*3/uL (ref 1.7–7.7)
Neutrophils Relative %: 57 %
Platelets: 227 10*3/uL (ref 150–400)
RBC: 4.83 MIL/uL (ref 3.87–5.11)
RDW: 18.5 % — ABNORMAL HIGH (ref 11.5–15.5)
WBC: 7 10*3/uL (ref 4.0–10.5)
nRBC: 0 % (ref 0.0–0.2)

## 2022-10-29 LAB — COMPREHENSIVE METABOLIC PANEL
ALT: 13 U/L (ref 0–44)
AST: 14 U/L — ABNORMAL LOW (ref 15–41)
Albumin: 3.7 g/dL (ref 3.5–5.0)
Alkaline Phosphatase: 111 U/L (ref 38–126)
Anion gap: 13 (ref 5–15)
BUN: 21 mg/dL (ref 8–23)
CO2: 27 mmol/L (ref 22–32)
Calcium: 8.6 mg/dL — ABNORMAL LOW (ref 8.9–10.3)
Chloride: 98 mmol/L (ref 98–111)
Creatinine, Ser: 1.31 mg/dL — ABNORMAL HIGH (ref 0.44–1.00)
GFR, Estimated: 42 mL/min — ABNORMAL LOW (ref >60)
Glucose, Bld: 113 mg/dL — ABNORMAL HIGH (ref 70–99)
Potassium: 3.2 mmol/L — ABNORMAL LOW (ref 3.5–5.1)
Sodium: 138 mmol/L (ref 135–145)
Total Bilirubin: 0.8 mg/dL (ref 0.3–1.2)
Total Protein: 7.1 g/dL (ref 6.5–8.1)

## 2022-10-29 NOTE — ED Triage Notes (Signed)
Pt presents to the ED POV from home with husband. Pt reports being diagnosed with covid on Friday. Pt states that her oxygen saturation has been getting low at night and when she walks around. Pt states that at rest it has been low 90's. Pt also reports a fluttering sensation in her chest. Pt A&Ox4 at time of triage.

## 2022-10-29 NOTE — ED Triage Notes (Signed)
First Nurse Note: Patient to ED via [facility] from home. Patient tested COVID positive on Friday- having fever, cough and dyspnea with exertion. VS WNL

## 2022-10-30 ENCOUNTER — Other Ambulatory Visit: Payer: Self-pay

## 2022-10-30 ENCOUNTER — Inpatient Hospital Stay: Payer: Medicare Other

## 2022-10-30 DIAGNOSIS — J9601 Acute respiratory failure with hypoxia: Secondary | ICD-10-CM | POA: Diagnosis present

## 2022-10-30 DIAGNOSIS — E1151 Type 2 diabetes mellitus with diabetic peripheral angiopathy without gangrene: Secondary | ICD-10-CM | POA: Diagnosis present

## 2022-10-30 DIAGNOSIS — E785 Hyperlipidemia, unspecified: Secondary | ICD-10-CM | POA: Diagnosis present

## 2022-10-30 DIAGNOSIS — Z7984 Long term (current) use of oral hypoglycemic drugs: Secondary | ICD-10-CM | POA: Diagnosis not present

## 2022-10-30 DIAGNOSIS — Z794 Long term (current) use of insulin: Secondary | ICD-10-CM | POA: Diagnosis not present

## 2022-10-30 DIAGNOSIS — U071 COVID-19: Secondary | ICD-10-CM | POA: Diagnosis present

## 2022-10-30 DIAGNOSIS — E1165 Type 2 diabetes mellitus with hyperglycemia: Secondary | ICD-10-CM | POA: Diagnosis present

## 2022-10-30 DIAGNOSIS — I2510 Atherosclerotic heart disease of native coronary artery without angina pectoris: Secondary | ICD-10-CM | POA: Diagnosis present

## 2022-10-30 DIAGNOSIS — I714 Abdominal aortic aneurysm, without rupture, unspecified: Secondary | ICD-10-CM | POA: Diagnosis present

## 2022-10-30 DIAGNOSIS — E039 Hypothyroidism, unspecified: Secondary | ICD-10-CM | POA: Diagnosis present

## 2022-10-30 DIAGNOSIS — N1831 Chronic kidney disease, stage 3a: Secondary | ICD-10-CM | POA: Diagnosis present

## 2022-10-30 DIAGNOSIS — E1140 Type 2 diabetes mellitus with diabetic neuropathy, unspecified: Secondary | ICD-10-CM | POA: Diagnosis present

## 2022-10-30 DIAGNOSIS — J984 Other disorders of lung: Secondary | ICD-10-CM | POA: Diagnosis present

## 2022-10-30 DIAGNOSIS — E1122 Type 2 diabetes mellitus with diabetic chronic kidney disease: Secondary | ICD-10-CM | POA: Diagnosis present

## 2022-10-30 DIAGNOSIS — Z6836 Body mass index (BMI) 36.0-36.9, adult: Secondary | ICD-10-CM | POA: Diagnosis not present

## 2022-10-30 DIAGNOSIS — Z79890 Hormone replacement therapy (postmenopausal): Secondary | ICD-10-CM | POA: Diagnosis not present

## 2022-10-30 DIAGNOSIS — Z950 Presence of cardiac pacemaker: Secondary | ICD-10-CM | POA: Diagnosis not present

## 2022-10-30 DIAGNOSIS — G2581 Restless legs syndrome: Secondary | ICD-10-CM | POA: Diagnosis present

## 2022-10-30 DIAGNOSIS — Z8249 Family history of ischemic heart disease and other diseases of the circulatory system: Secondary | ICD-10-CM | POA: Diagnosis not present

## 2022-10-30 DIAGNOSIS — M79661 Pain in right lower leg: Secondary | ICD-10-CM | POA: Diagnosis present

## 2022-10-30 DIAGNOSIS — E662 Morbid (severe) obesity with alveolar hypoventilation: Secondary | ICD-10-CM | POA: Diagnosis present

## 2022-10-30 DIAGNOSIS — F32A Depression, unspecified: Secondary | ICD-10-CM | POA: Diagnosis present

## 2022-10-30 DIAGNOSIS — I5032 Chronic diastolic (congestive) heart failure: Secondary | ICD-10-CM | POA: Diagnosis present

## 2022-10-30 DIAGNOSIS — I130 Hypertensive heart and chronic kidney disease with heart failure and stage 1 through stage 4 chronic kidney disease, or unspecified chronic kidney disease: Secondary | ICD-10-CM | POA: Diagnosis present

## 2022-10-30 DIAGNOSIS — E876 Hypokalemia: Secondary | ICD-10-CM | POA: Diagnosis present

## 2022-10-30 LAB — BASIC METABOLIC PANEL
Anion gap: 13 (ref 5–15)
BUN: 18 mg/dL (ref 8–23)
CO2: 25 mmol/L (ref 22–32)
Calcium: 8.9 mg/dL (ref 8.9–10.3)
Chloride: 100 mmol/L (ref 98–111)
Creatinine, Ser: 1.21 mg/dL — ABNORMAL HIGH (ref 0.44–1.00)
GFR, Estimated: 46 mL/min — ABNORMAL LOW (ref >60)
Glucose, Bld: 116 mg/dL — ABNORMAL HIGH (ref 70–99)
Potassium: 3.2 mmol/L — ABNORMAL LOW (ref 3.5–5.1)
Sodium: 138 mmol/L (ref 135–145)

## 2022-10-30 LAB — CBG MONITORING, ED
Glucose-Capillary: 99 mg/dL (ref 70–99)
Glucose-Capillary: 159 mg/dL — ABNORMAL HIGH (ref 70–99)

## 2022-10-30 LAB — CBC
HCT: 37.9 % (ref 36.0–46.0)
Hemoglobin: 11.6 g/dL — ABNORMAL LOW (ref 12.0–15.0)
MCH: 24.6 pg — ABNORMAL LOW (ref 26.0–34.0)
MCHC: 30.6 g/dL (ref 30.0–36.0)
MCV: 80.3 fL (ref 80.0–100.0)
Platelets: 234 10*3/uL (ref 150–400)
RBC: 4.72 MIL/uL (ref 3.87–5.11)
RDW: 18.2 % — ABNORMAL HIGH (ref 11.5–15.5)
WBC: 6.2 10*3/uL (ref 4.0–10.5)
nRBC: 0 % (ref 0.0–0.2)

## 2022-10-30 LAB — GLUCOSE, CAPILLARY
Glucose-Capillary: 89 mg/dL (ref 70–99)
Glucose-Capillary: 147 mg/dL — ABNORMAL HIGH (ref 70–99)

## 2022-10-30 LAB — D-DIMER, QUANTITATIVE: D-Dimer, Quant: 0.76 ug{FEU}/mL — ABNORMAL HIGH (ref 0.00–0.50)

## 2022-10-30 LAB — HEMOGLOBIN A1C
Hgb A1c MFr Bld: 6.4 % — ABNORMAL HIGH (ref 4.8–5.6)
Mean Plasma Glucose: 137 mg/dL

## 2022-10-30 LAB — MAGNESIUM: Magnesium: 2.4 mg/dL (ref 1.7–2.4)

## 2022-10-30 LAB — CREATININE, SERUM
Creatinine, Ser: 1.27 mg/dL — ABNORMAL HIGH (ref 0.44–1.00)
GFR, Estimated: 44 mL/min — ABNORMAL LOW (ref >60)

## 2022-10-30 LAB — TROPONIN I (HIGH SENSITIVITY): Troponin I (High Sensitivity): 5 ng/L (ref <18)

## 2022-10-30 MED ORDER — ASPIRIN 81 MG PO TBEC
81.0000 mg | DELAYED_RELEASE_TABLET | Freq: Every day | ORAL | Status: DC
  Administered 2022-10-30: 81 mg via ORAL
  Filled 2022-10-30: qty 1

## 2022-10-30 MED ORDER — ENOXAPARIN SODIUM 60 MG/0.6ML IJ SOSY
45.0000 mg | PREFILLED_SYRINGE | INTRAMUSCULAR | Status: DC
  Administered 2022-10-30 – 2022-10-31 (×2): 45 mg via SUBCUTANEOUS
  Filled 2022-10-30 (×2): qty 0.6

## 2022-10-30 MED ORDER — TRAMADOL HCL 50 MG PO TABS: 50.0000 mg | ORAL_TABLET | Freq: Four times a day (QID) | ORAL | Status: DC | PRN

## 2022-10-30 MED ORDER — ONDANSETRON HCL 4 MG PO TABS: 4.0000 mg | ORAL_TABLET | Freq: Four times a day (QID) | ORAL | Status: DC | PRN

## 2022-10-30 MED ORDER — ACETAMINOPHEN 325 MG PO TABS: 650.0000 mg | ORAL_TABLET | Freq: Four times a day (QID) | ORAL | Status: DC | PRN

## 2022-10-30 MED ORDER — TEMAZEPAM 15 MG PO CAPS
30.0000 mg | ORAL_CAPSULE | Freq: Every day | ORAL | Status: DC
  Administered 2022-10-30: 30 mg via ORAL
  Filled 2022-10-30: qty 4

## 2022-10-30 MED ORDER — DOXYCYCLINE HYCLATE 20 MG PO TABS: 20.0000 mg | ORAL_TABLET | Freq: Two times a day (BID) | ORAL | Status: DC

## 2022-10-30 MED ORDER — GABAPENTIN 300 MG PO CAPS
300.0000 mg | ORAL_CAPSULE | Freq: Two times a day (BID) | ORAL | Status: DC
  Administered 2022-10-30 – 2022-10-31 (×3): 300 mg via ORAL
  Filled 2022-10-30 (×3): qty 1

## 2022-10-30 MED ORDER — INSULIN ASPART 100 UNIT/ML IJ SOLN
0.0000 [IU] | Freq: Three times a day (TID) | INTRAMUSCULAR | Status: DC
  Administered 2022-10-30: 4 [IU] via SUBCUTANEOUS
  Administered 2022-10-31: 3 [IU] via SUBCUTANEOUS
  Filled 2022-10-30 (×2): qty 1

## 2022-10-30 MED ORDER — EMPAGLIFLOZIN 10 MG PO TABS
10.0000 mg | ORAL_TABLET | Freq: Every day | ORAL | Status: DC
  Administered 2022-10-30 – 2022-10-31 (×2): 10 mg via ORAL
  Filled 2022-10-30 (×2): qty 1

## 2022-10-30 MED ORDER — ESCITALOPRAM OXALATE 10 MG PO TABS: 5.0000 mg | ORAL_TABLET | Freq: Every day | ORAL | Status: DC

## 2022-10-30 MED ORDER — NITROGLYCERIN 0.4 MG SL SUBL: 0.4000 mg | SUBLINGUAL_TABLET | SUBLINGUAL | Status: DC | PRN

## 2022-10-30 MED ORDER — ROPINIROLE HCL 1 MG PO TABS
5.0000 mg | ORAL_TABLET | Freq: Every day | ORAL | Status: DC
  Administered 2022-10-30: 5 mg via ORAL
  Filled 2022-10-30 (×2): qty 5

## 2022-10-30 MED ORDER — TORSEMIDE 20 MG PO TABS: 10.0000 mg | ORAL_TABLET | Freq: Every day | ORAL | Status: DC

## 2022-10-30 MED ORDER — IOHEXOL 350 MG/ML SOLN
75.0000 mL | Freq: Once | INTRAVENOUS | Status: AC | PRN
  Administered 2022-10-30: 75 mL via INTRAVENOUS

## 2022-10-30 MED ORDER — INSULIN GLARGINE-YFGN 100 UNIT/ML ~~LOC~~ SOLN
20.0000 [IU] | Freq: Every day | SUBCUTANEOUS | Status: DC
  Filled 2022-10-30 (×2): qty 0.2

## 2022-10-30 MED ORDER — MAGNESIUM SULFATE 2 GM/50ML IV SOLN
2.0000 g | Freq: Once | INTRAVENOUS | Status: AC
  Administered 2022-10-30: 2 g via INTRAVENOUS
  Filled 2022-10-30: qty 50

## 2022-10-30 MED ORDER — INSULIN ASPART 100 UNIT/ML IJ SOLN: 0.0000 [IU] | Freq: Every day | INTRAMUSCULAR | Status: DC

## 2022-10-30 MED ORDER — MOLNUPIRAVIR 200 MG PO CAPS: 4.0000 | ORAL_CAPSULE | Freq: Two times a day (BID) | ORAL | Status: DC

## 2022-10-30 MED ORDER — POTASSIUM CHLORIDE CRYS ER 20 MEQ PO TBCR
40.0000 meq | EXTENDED_RELEASE_TABLET | ORAL | Status: AC
  Administered 2022-10-30 (×2): 40 meq via ORAL
  Filled 2022-10-30 (×2): qty 2

## 2022-10-30 MED ORDER — ALBUTEROL SULFATE HFA 108 (90 BASE) MCG/ACT IN AERS: 1.0000 | INHALATION_SPRAY | RESPIRATORY_TRACT | Status: DC | PRN

## 2022-10-30 MED ORDER — ORAL CARE MOUTH RINSE: 15.0000 mL | OROMUCOSAL | Status: DC | PRN

## 2022-10-30 MED ORDER — DOXYCYCLINE HYCLATE 20 MG PO TABS
20.0000 mg | ORAL_TABLET | Freq: Two times a day (BID) | ORAL | Status: DC
  Administered 2022-10-30 – 2022-10-31 (×2): 20 mg via ORAL

## 2022-10-30 MED ORDER — MOLNUPIRAVIR 200 MG PO CAPS
4.0000 | ORAL_CAPSULE | Freq: Two times a day (BID) | ORAL | Status: DC
  Administered 2022-10-30 – 2022-10-31 (×2): 800 mg via ORAL

## 2022-10-30 MED ORDER — ATENOLOL 25 MG PO TABS
25.0000 mg | ORAL_TABLET | Freq: Every day | ORAL | Status: DC
  Administered 2022-10-30 – 2022-10-31 (×2): 25 mg via ORAL
  Filled 2022-10-30 (×2): qty 1

## 2022-10-30 MED ORDER — ACETAMINOPHEN 650 MG RE SUPP: 650.0000 mg | Freq: Four times a day (QID) | RECTAL | Status: DC | PRN

## 2022-10-30 MED ORDER — PANTOPRAZOLE SODIUM 40 MG PO TBEC
40.0000 mg | DELAYED_RELEASE_TABLET | Freq: Every day | ORAL | Status: DC
  Administered 2022-10-30 – 2022-10-31 (×2): 40 mg via ORAL
  Filled 2022-10-30 (×2): qty 1

## 2022-10-30 MED ORDER — ONDANSETRON HCL 4 MG/2ML IJ SOLN
4.0000 mg | Freq: Four times a day (QID) | INTRAMUSCULAR | Status: DC | PRN
  Administered 2022-10-31: 4 mg via INTRAVENOUS
  Filled 2022-10-30: qty 2

## 2022-10-30 NOTE — Progress Notes (Signed)
Patient said she had not been taking torsemide at home because she is not holding fluid. Dr Meriam Sprague notified and torsemide order d/c

## 2022-10-30 NOTE — ED Notes (Signed)
Pt ambulated to the bathroom with steady gait, husband at bedside pt and husband eating lunch. Pt sitting in the recliner chair per her request and updated on plan of care.

## 2022-10-30 NOTE — Plan of Care (Signed)
Problem: Education: Goal: Understanding of cardiac disease, CV risk reduction, and recovery process will improve Outcome: Progressing Goal: Individualized Educational Video(s) Outcome: Progressing   Problem: Activity: Goal: Ability to tolerate increased activity will improve Outcome: Progressing

## 2022-10-30 NOTE — Progress Notes (Signed)
PHARMACIST - PHYSICIAN COMMUNICATION  CONCERNING:  Enoxaparin (Lovenox) for DVT Prophylaxis    RECOMMENDATION: Patient was prescribed enoxaprin 40mg  q24 hours for VTE prophylaxis.   Filed Weights   10/29/22 1703  Weight: 88.5 kg (195 lb)    Body mass index is 36.84 kg/m.  Estimated Creatinine Clearance: 36.4 mL/min (A) (by C-G formula based on SCr of 1.31 mg/dL (H)).   Based on [facility] policy patient is candidate for enoxaparin 0.5mg /kg TBW SQ every 24 hours based on BMI being >30.  DESCRIPTION: Pharmacy has adjusted enoxaparin dose per [facility] policy.  Patient is now receiving enoxaparin 0.5 mg/kg every 24 hours   Otelia Sergeant, PharmD, [facility] 10/30/2022 3:51 AM

## 2022-10-30 NOTE — ED Notes (Addendum)
This RN at bedside giving patient medicaitons. Pt choked on potassium pill while RN at bedside. This RN had to perform the Heimlich on patient. Potassium pill dislodged. Patient's vitals stable after Heimlich. Admitting provider at bedside immediately after and this RN informed him about situation.

## 2022-10-30 NOTE — ED Notes (Signed)
Messaged pharmacy about obtaining a med rec

## 2022-10-30 NOTE — H&P (Signed)
History and Physical    Patient: [name] ZOX:[id number] DOB: 1945-07-11 DOA: 10/29/2022 DOS: the patient was seen and examined on 10/30/2022 PCP: Danella Penton, MD  Patient coming from: Home  Chief Complaint:  Chief Complaint  Patient presents with   Shortness of Breath    HPI: [name] is a 77 y.o. female with medical history significant for Class III obesity, diastolic CHF, CKD 3 A, OSA on CPAP, insulin-dependent type 2 DM, HTN, s/p pacemaker 2021, CAD with nonobstructive disease on CT coronary 6/23 who was brought in by EMS for evaluation of persistent shortness of breath and protracted weakness in the setting of COVID infection for which she is on day 4 of molnupiravir.  She was checking her O2 sats at home and it was in the 70s does bring her to the ED.  She denies fever and chills. ED course and data reviewed: O2 sat in the ED remained normal on room air and with ambulation.  Vitals were otherwise unremarkable. Labs: CBC Unremarkable.  CMP notable for mild hypokalemia of 3.2.  Creatinine was at baseline at 1.31. EKG, personally viewed and interpreted showed NSR at 97 with nonspecific ST-T wave changes Chest x-ray with no acute cardiopulmonary disease. Admission requested for COVID-related weakness   Review of Systems: As mentioned in the history of present illness. All other systems reviewed and are negative.  Past Medical History:  Diagnosis Date   Acquired elevated diaphragm    HIGHER ON RIGHT SIDE   Anemia    Anxiety    Aortic atherosclerosis (HCC)    Arthritis    CAD (coronary artery disease)    a. 07/2017 MV: Low risk; b. 09/2017 Cath: LM nl, LAD 50p/m, D1 80ost, LCX large/nl, RCA small/nl.   Chronic back pain    stenosis.degenerative disc,some scoliosis   Chronic heart failure with preserved ejection fraction (HFpEF) (HCC)    a. 07/2017 Echo: EF 60-65%, no rwma, gr2 DD, nl RV fxn.   Chronic kidney disease    stage 3   Complication of anesthesia 2018    aspirated during wrist surgery during LMA removal   Constipation    takes Stool Softener daily   Coronary artery disease    Depression    takes Cymbalta daily   Diabetes mellitus    Type 2 diabetic. Average fasting blood sugar runs high 170-200   Diastolic CHF (HCC) 07/17/2017   Per patient, diagnosed in 2018   Diverticulosis    Dysplastic nevus 09/12/2017   L sup buttock - mild    Dysplastic nevus 09/18/2018   R sup med buttocks - mild    E coli infection    GERD (gastroesophageal reflux disease)    takes Nexium daily   Grade II diastolic dysfunction    Headache    Hemorrhoids    History of colon polyps    benign   History of gout    doesn't take any meds   History of hiatal hernia    small   History of vertigo    doesn't take any meds   Hyperlipidemia    takes Praluent daily   Hypertension    currently BP medications are on hold    Hypothyroidism    takes Synthroid daily   Insomnia    takes Restoril nightly   Muscle spasm    takes Robaxin as needed   NSVT (nonsustained ventricular tachycardia) (HCC)    OSA on CPAP    Paroxysmal atrial tachycardia  Peripheral vascular disease (HCC)    AAA as stated per pt / was just discovered and pt states has not been referred to vascular MD    Presence of permanent cardiac pacemaker    Restless leg    takes Requip daily   Rosacea    Symptomatic bradycardia    a. 02/2019 s/p Biotronik Edora 8 DR-T DC PPM   Syncope    a. Documented bradycardia and pauses-->02/2019 s/p Biotronik Edora 8 DR-T DC PPM.   Varicose veins    Past Surgical History:  Procedure Laterality Date   ABDOMINAL HYSTERECTOMY     with BSo   BACK SURGERY     CARDIAC CATHETERIZATION  2013   Normal   CARPAL TUNNEL RELEASE Bilateral    CATARACT EXTRACTION, BILATERAL     CHOLECYSTECTOMY     COLONOSCOPY     COLONOSCOPY WITH PROPOFOL N/A 11/25/2018   Procedure: COLONOSCOPY WITH PROPOFOL;  Surgeon: Toledo, Boykin Nearing, MD;  Location: [facility] ENDOSCOPY;  Service:  Gastroenterology;  Laterality: N/A;   DIAGNOSTIC LAPAROSCOPY     multiple times   DILATION AND CURETTAGE OF UTERUS     ESOPHAGOGASTRODUODENOSCOPY (EGD) WITH PROPOFOL N/A 11/01/2014   Procedure: ESOPHAGOGASTRODUODENOSCOPY (EGD) WITH PROPOFOL;  Surgeon: Wallace Cullens, MD;  Location: [facility] ENDOSCOPY;  Service: Gastroenterology;  Laterality: N/A;   ESOPHAGOGASTRODUODENOSCOPY (EGD) WITH PROPOFOL N/A 11/25/2018   Procedure: ESOPHAGOGASTRODUODENOSCOPY (EGD) WITH PROPOFOL;  Surgeon: Toledo, Boykin Nearing, MD;  Location: [facility] ENDOSCOPY;  Service: Gastroenterology;  Laterality: N/A;   ESOPHAGOGASTRODUODENOSCOPY (EGD) WITH PROPOFOL N/A 07/11/2022   Procedure: ESOPHAGOGASTRODUODENOSCOPY (EGD) WITH PROPOFOL;  Surgeon: Toledo, Boykin Nearing, MD;  Location: [facility] ENDOSCOPY;  Service: Gastroenterology;  Laterality: N/A;  IDDM   HARDWARE REMOVAL Left 10/11/2016   Procedure: HARDWARE REMOVAL-LEFT RADIUS;  Surgeon: Lyndle Herrlich, MD;  Location: [facility] ORS;  Service: Orthopedics;  Laterality: Left;  Left Radius Wrist    IMPLANTABLE CONTACT LENS IMPLANTATION     bilateral   KNEE ARTHROSCOPY Right 05/09/2020   Procedure: ARTHROSCOPY KNEE;  Surgeon: Donato Heinz, MD;  Location: [facility] ORS;  Service: Orthopedics;  Laterality: Right;   LAMINECTOMY  11/13/2015   LEFT HEART CATH AND CORONARY ANGIOGRAPHY Left 10/01/2017   Procedure: LEFT HEART CATH AND CORONARY ANGIOGRAPHY;  Surgeon: Yvonne Kendall, MD;  Location: [facility] INVASIVE CV LAB;  Service: Cardiovascular;  Laterality: Left;   LOOP RECORDER INSERTION N/A 10/17/2017   Procedure: LOOP RECORDER INSERTION;  Surgeon: Duke Salvia, MD;  Location: [facility] INVASIVE CV LAB;  Service: Cardiovascular;  Laterality: N/A;   LOOP RECORDER REMOVAL N/A 03/04/2019   Procedure: LOOP RECORDER REMOVAL;  Surgeon: Duke Salvia, MD;  Location: [facility] INVASIVE CV LAB;  Service: Cardiovascular;  Laterality: N/A;   LUMBAR FUSION  11/2015   LUMBAR WOUND DEBRIDEMENT N/A 12/02/2015   Procedure: WOUND  Exploration;  Surgeon: Lisbeth Renshaw, MD;  Location: [facility] OR;  Service: Neurosurgery;  Laterality: N/A;   OPEN REDUCTION INTERNAL FIXATION (ORIF) DISTAL RADIAL FRACTURE Left 08/29/2016   Procedure: OPEN REDUCTION INTERNAL FIXATION (ORIF) DISTAL RADIAL FRACTURE;  Surgeon: Lyndle Herrlich, MD;  Location: [facility] ORS;  Service: Orthopedics;  Laterality: Left;   PACEMAKER IMPLANT N/A 03/04/2019   Procedure: PACEMAKER IMPLANT;  Surgeon: Duke Salvia, MD;  Location[facility] INVASIVE CV LAB;  Service: Cardiovascular;  Laterality: N/A;   PICC LINE PLACE PERIPHERAL ([facility] HX)     right upper arm    RIGHT/LEFT HEART CATH AND CORONARY ANGIOGRAPHY N/A 12/26/2021   Procedure: RIGHT/LEFT HEART CATH AND CORONARY  ANGIOGRAPHY;  Surgeon: Yvonne Kendall, MD;  Location: [facility] INVASIVE CV LAB;  Service: Cardiovascular;  Laterality: N/A;   ROTATOR CUFF REPAIR Right    SAVORY DILATION N/A 11/01/2014   Procedure: SAVORY DILATION;  Surgeon: Wallace Cullens, MD;  Location: [facility] ENDOSCOPY;  Service: Gastroenterology;  Laterality: N/A;   TONSILLECTOMY     TRIGGER FINGER RELEASE Bilateral    Social History:  reports that she has never smoked. She has never used smokeless tobacco. She reports that she does not drink alcohol and does not use drugs.  Allergies  Allergen Reactions   Ezetimibe Diarrhea and Nausea Only   Hydrocodone-Acetaminophen Other (See Comments)   Metoclopramide Diarrhea   Propoxyphene Other (See Comments)    Unsure of reaction type Unsure of reaction type Unsure of reaction type   Cefuroxime    Hydrocodone Other (See Comments)   Tramadol Hives   Ceftin [Cefuroxime Axetil] Diarrhea   Codeine Rash        Penicillins Rash and Other (See Comments)    Has patient had a PCN reaction causing immediate rash, facial/tongue/throat swelling, SOB or lightheadedness with hypotension: NO Has patient had a PCN reaction causing severe rash involving mucus membranes or skin necrosis: NO Has patient had a PCN retioion  that required hospitalization NO Has patient had a PCN reaction occurring within the last 10 years: NO If all of the above answers are "NO", then may proceed with Cephalosporin use.   Statins Other (See Comments)    Leg pain Leg pain Other reaction(s): restless leg syndrome   Vicodin [Hydrocodone-Acetaminophen] Other (See Comments)    passes out    Family History  Problem Relation Age of Onset   Heart disease Mother    Breast cancer Mother 74   Heart disease Father    Heart attack Sister    Heart disease Sister    Heart disease Brother     Prior to Admission medications   Medication Sig Start Date End Date Taking? Authorizing Provider  molnupiravir EUA (LAGEVRIO) 200 MG CAPS capsule Take 4 capsules by mouth 2 (two) times daily. 10/26/22 10/31/22 Yes [provider]  Acetaminophen (TYLENOL ARTHRITIS PAIN PO) Take 1,300 mg by mouth every 8 (eight) hours as needed (pain).    [provider]  albuterol (PROVENTIL) (2.5 MG/3ML) 0.083% nebulizer solution Take 3 mLs (2.5 mg total) by nebulization every 4 (four) hours as needed for wheezing or shortness of breath. 03/27/22   Erin Fulling, MD  albuterol (VENTOLIN HFA) 108 (90 Base) MCG/ACT inhaler Inhale 1 puff into the lungs every 4 (four) hours as needed. 01/23/22 01/23/23  [provider]  aspirin 81 MG tablet Take 81 mg by mouth at bedtime.     [provider]  atenolol (TENORMIN) 25 MG tablet Take 1 tablet (25 mg total) by mouth every morning. 12/27/21   Tresa Moore, MD  azelastine (ASTELIN) 0.1 % nasal spray Place 2 sprays into both nostrils 2 (two) times daily as needed for rhinitis.    [provider]  cholecalciferol (VITAMIN D3) 25 MCG (1000 UNIT) tablet Take 1,000 Units by mouth daily.    [provider]  Crisaborole (EUCRISA) 2 % OINT Apply 1 Application topically as directed. qd to bid to aa eczema on back as needed for flares 09/27/22   Deirdre Evener, MD  diclofenac  Sodium (VOLTAREN) 1 % GEL Apply 2 g topically 2 (two) times daily.    [provider]  doxycycline (ORACEA) 40 MG capsule  Take 1 capsule (40 mg total) by mouth every morning. 07/04/22   Deirdre Evener, MD  doxycycline (ORACEA) 40 MG capsule Take 1 capsule (40 mg total) by mouth daily. Take with food and drink 09/27/22   Deirdre Evener, MD  doxycycline (PERIOSTAT) 20 MG tablet Take 1 tablet (20 mg total) by mouth 2 (two) times daily. 07/19/22   Deirdre Evener, MD  empagliflozin (JARDIANCE) 10 MG TABS tablet Take 10 mg by mouth daily. 12/15/21 12/15/22  [provider]  escitalopram (LEXAPRO) 5 MG tablet Take 5 mg by mouth at bedtime. 06/07/22 06/07/23  [provider]  esomeprazole (NEXIUM) 40 MG capsule Take 40 mg by mouth 2 (two) times daily.    [provider]  gabapentin (NEURONTIN) 300 MG capsule Take 300 mg by mouth 2 (two) times daily.    [provider]  Insulin Aspart (NOVOLOG FLEXPEN [place]) Inject 10 Units into the skin as needed (diabetes).    [provider]  Ivermectin 1 % CREA Apply 1 Application topically as directed. qd to bid to face for Rosacea 09/27/22   Deirdre Evener, MD  LANTUS SOLOSTAR 100 UNIT/ML Solostar Pen Inject 28 Units into the skin every morning. 06/27/20   [provider]  levothyroxine (SYNTHROID, LEVOTHROID) 75 MCG tablet Take 75 mcg by mouth daily before breakfast.  11/24/12   [provider]  methocarbamol (ROBAXIN) 500 MG tablet Take 500 mg by mouth every 8 (eight) hours as needed for muscle spasms.    [provider]  metolazone (ZAROXOLYN) 2.5 MG tablet Take 2.5 mg by mouth daily as needed (fluid overload). 12/29/21   [provider]  mometasone (ELOCON) 0.1 % cream Apply 1 application topically daily as needed (Rash). Qd to bid up to 5 days a week aa right lower leg prn itching 06/22/20   Deirdre Evener, MD  nitroGLYCERIN (NITROSTAT) 0.4 MG SL tablet Place 1 tablet (0.4 mg  total) under the tongue every 5 (five) minutes as needed for chest pain. 07/13/21 01/25/23  Antonieta Iba, MD  ondansetron (ZOFRAN) 4 MG tablet Take 4 mg by mouth every 8 (eight) hours as needed for nausea or vomiting.  07/19/17   [provider]  ondansetron (ZOFRAN-ODT) 4 MG disintegrating tablet Take 1 tablet (4 mg total) by mouth every 6 (six) hours as needed for nausea or vomiting. Patient not taking: Reported on 06/05/2022 05/06/21   Sharyn Creamer, MD  potassium chloride (KLOR-CON) 10 MEQ tablet Take 2 tablet (20 meq) by mouth twice daily as directed. Take an additional 2 tablets ( 20 meg) with extra dose or Torsemide. Take 2 tablets (20 meq) when taking Metolazone. 08/14/22   Antonieta Iba, MD  PRALUENT 150 MG/ML SOAJ INJECT 150 MG UNDER THE SKIN EVERY 14 DAYS 12/28/21   Antonieta Iba, MD  ranolazine (RANEXA) 500 MG 12 hr tablet Take 2 tablets (1,000 mg total) by mouth 2 (two) times daily. 08/18/21   Dunn, Raymon Mutton, PA-C  ropinirole (REQUIP) 5 MG tablet Take 5 mg by mouth at bedtime.    [provider]  temazepam (RESTORIL) 30 MG capsule Take 30 mg by mouth at bedtime.    [provider]  torsemide (DEMADEX) 20 MG tablet TAKE 2 TABLETS BY MOUTH TWICE A DAY AS DIRECTED 08/24/22   Antonieta Iba, MD  traMADol (ULTRAM) 50 MG tablet Take 50 mg by mouth every 6 (six) hours as needed for moderate pain or severe pain.  [provider]  triamcinolone cream (KENALOG) 0.1 % Apply to affected skin qd-bid 5 days a week prn flares. Avoid applying to face, groin, and axilla. Use as directed. Long-term use can cause thinning of the skin. 07/04/22   Deirdre Evener, MD  trolamine salicylate (ASPERCREME) 10 % cream Apply 1 application topically at bedtime.    [provider]    Physical Exam: Vitals:   10/30/22 0015 10/30/22 0030 10/30/22 0045 10/30/22 0100  BP:  (!) 153/60  121/64  Pulse: 94 (!) 102 86 89  Resp: 13 17 (!) 29 17  Temp:      TempSrc:       SpO2: 95% 99% 96% 97%  Weight:      Height:       Physical Exam Vitals and nursing note reviewed.  Constitutional:      General: She is not in acute distress.    Comments: Patient appears comfortable does not appear to be in any distress  HENT:     Head: Normocephalic and atraumatic.  Cardiovascular:     Rate and Rhythm: Regular rhythm. Tachycardia present.     Heart sounds: Normal heart sounds.  Pulmonary:     Effort: Tachypnea present.     Breath sounds: Normal breath sounds.  Abdominal:     Palpations: Abdomen is soft.     Tenderness: There is no abdominal tenderness.  Neurological:     Mental Status: Mental status is at baseline.     Labs on Admission: I have personally reviewed following labs and imaging studies  CBC: Recent Labs  Lab 10/29/22 1716  WBC 7.0  NEUTROABS 3.9  HGB 12.2  HCT 39.8  MCV 82.4  PLT 227   Basic Metabolic Panel: Recent Labs  Lab 10/29/22 1716  NA 138  K 3.2*  CL 98  CO2 27  GLUCOSE 113*  BUN 21  CREATININE 1.31*  CALCIUM 8.6*   GFR: Estimated Creatinine Clearance: 36.4 mL/min (A) (by C-G formula based on SCr of 1.31 mg/dL (H)). Liver Function Tests: Recent Labs  Lab 10/29/22 1716  AST 14*  ALT 13  ALKPHOS 111  BILITOT 0.8  PROT 7.1  ALBUMIN 3.7   No results for input(s): "LIPASE", "AMYLASE" in the last 168 hours. No results for input(s): "AMMONIA" in the last 168 hours. Coagulation Profile: No results for input(s): "INR", "PROTIME" in the last 168 hours. Cardiac Enzymes: No results for input(s): "CKTOTAL", "CKMB", "CKMBINDEX", "TROPONINI" in the last 168 hours. BNP (last 3 results) No results for input(s): "PROBNP" in the last 8760 hours. HbA1C: No results for input(s): "HGBA1C" in the last 72 hours. CBG: No results for input(s): "GLUCAP" in the last 168 hours. Lipid Profile: No results for input(s): "CHOL", "HDL", "LDLCALC", "TRIG", "CHOLHDL", "LDLDIRECT" in the last 72 hours. Thyroid Function Tests: No  results for input(s): "TSH", "T4TOTAL", "FREET4", "T3FREE", "THYROIDAB" in the last 72 hours. Anemia Panel: No results for input(s): "VITAMINB12", "FOLATE", "FERRITIN", "TIBC", "IRON", "RETICCTPCT" in the last 72 hours. Urine analysis:    Component Value Date/Time   COLORURINE YELLOW (A) 12/27/2021 1000   APPEARANCEUR HAZY (A) 12/27/2021 1000   LABSPEC 1.024 12/27/2021 1000   PHURINE 5.0 12/27/2021 1000   GLUCOSEU >=500 (A) 12/27/2021 1000   HGBUR NEGATIVE 12/27/2021 1000   BILIRUBINUR NEGATIVE 12/27/2021 1000   BILIRUBINUR negative 05/06/2021 0846   KETONESUR NEGATIVE 12/27/2021 1000   PROTEINUR NEGATIVE 12/27/2021 1000   UROBILINOGEN 0.2 05/06/2021 0846   NITRITE NEGATIVE 12/27/2021 1000  LEUKOCYTESUR NEGATIVE 12/27/2021 1000    Radiological Exams on Admission: DG Chest 2 View  Result Date: 10/29/2022 CLINICAL DATA:  Shortness of breath.  COVID EXAM: CHEST - 2 VIEW COMPARISON:  X-ray 08/12/2022 FINDINGS: Eventration of the right hemidiaphragm. Left upper chest pacemaker. No consolidation, pneumothorax or effusion. No edema. Normal cardiopericardial silhouette. Calcified aorta. Degenerative changes of the spine. Surgical clips in the right upper quadrant. IMPRESSION: Pacemaker.  No acute cardiopulmonary disease. Electronically Signed   By: Karen Kays M.D.   On: 10/29/2022 17:35     Data Reviewed: Relevant notes from primary care and specialist visits, past discharge summaries as available in EHR, including Care Everywhere. Prior diagnostic testing as pertinent to current admission diagnoses Updated medications and problem lists for reconciliation ED course, including vitals, labs, imaging, treatment and response to treatment Triage notes, nursing and pharmacy notes and ED provider's notes Notable results as noted in HPI    Assessment and Plan: *  COVID infection Acute on chronic dyspnea Covid related Generalized weakness -Likely multifactorial and related to COVID with  underlying OSA/OHS, CHF,  physical deconditioning. -Patient is not hypoxic, but was tachycardic and tachypneic - Continue molnupiravir - Albuterol as needed -COVID precautions -Supplemental oxygen if needed -Incentive spirometry - Will get D-dimer and if elevated get CTA  Coronary artery disease Coronary CTA 07/2021 showed nonobstructive disease with normal FFR.  Continue home meds   Restrictive lung disease on spirometry 04/2021 Patient had spirometry March 2023, Continue home inhalers   Chronic diastolic heart failure [facility]) Patient appears clinically euvolemic a Continue GDMT torsemide, Trajenta and spironolactone as well as atenolol and Imdur EF 09/02/21 60 to 65%, G2 DD.    CKD 3a-b chronic kidney disease (HCC) At baseline   Type 2 diabetes mellitus with long-term current use of insulin (HCC) Continue home basal insulin, Tradjenta with sliding scale coverage   Morbid obesity (HCC) Complicating factor to overall prognosis and care   Status post placement of cardiac pacemaker No acute issues suspected Followed by Dr. Graciela Husbands   Hypothyroidism Continue levothyroxine   HTN (hypertension) Continue atenolol.   OSA on CPAP CPAP nightly    DVT prophylaxis: Lovenox  Consults: none  Advance Care Planning:   Code Status: Prior   Family Communication: none  Disposition Plan: Back to previous home environment  Severity of Illness: The appropriate patient status for this patient is INPATIENT. Inpatient status is judged to be reasonable and necessary in order to provide the required intensity of service to ensure the patient's safety. The patient's presenting symptoms, physical exam findings, and initial radiographic and laboratory data in the context of their chronic comorbidities is felt to place them at high risk for further clinical deterioration. Furthermore, it is not anticipated that the patient will be medically stable for discharge from the hospital within 2 midnights  of admission.   * I certify that at the point of admission it is my clinical judgment that the patient will require inpatient hospital care spanning beyond 2 midnights from the point of admission due to high intensity of service, high risk for further deterioration and high frequency of surveillance required.*  Author: Andris Baumann, MD 10/30/2022 3:37 AM  For on call review [email or web address].

## 2022-10-30 NOTE — Progress Notes (Addendum)
Progress Note   Patient: [name] SAY:[id number] DOB: 06-02-1945 DOA: 10/29/2022     0 DOS: the patient was seen and examined on 10/30/2022    Subjective:  Patient seen and examined at bedside this morning Had some episode of PVCs this morning Not requiring oxygen Admits to generalized weakness however improving Denies cough chest pain nausea vomiting or abdominal pain   Brief hospital course: From HPI "[name] is a 77 y.o. female with medical history significant for Class III obesity, diastolic CHF, CKD 3 A, OSA on CPAP, insulin-dependent type 2 DM, HTN, s/p pacemaker 2021, CAD with nonobstructive disease on CT coronary 6/23 who was brought in by EMS for evaluation of persistent shortness of breath and protracted weakness in the setting of COVID infection for which she is on day 4 of molnupiravir.  She was checking her O2 sats at home and it was in the 70s does bring her to the ED.  She denies fever and chills. ED course and data reviewed: O2 sat in the ED remained normal on room air and with ambulation.  Vitals were otherwise unremarkable. Labs: CBC Unremarkable.  CMP notable for mild hypokalemia of 3.2.  Creatinine was at baseline at 1.31. EKG, personally viewed and interpreted showed NSR at 97 with nonspecific ST-T wave changes Chest x-ray with no acute cardiopulmonary disease. Admission requested for COVID-related weakness "   Assessment and Plan: COVID infection Acute on chronic dyspnea Covid related Generalized weakness -Likely multifactorial and related to COVID with underlying OSA/OHS, CHF,  physical deconditioning. Patient currently not requiring oxygen but was tachycardic and dyspneic on presentation Continue molnupiravir Continue albuterol as needed Continue COVID precautions Supplemental oxygen if needed Continue incentive spirometer D-dimer slightly elevated and so CT angio have been requested    Right calf pain rule out DVT Doppler ultrasound  requested  Coronary artery disease Coronary CTA 07/2021 showed nonobstructive disease with normal FFR.  Patient not on statin therapy on account of allergy Continue aspirin therapy   Restrictive lung disease on spirometry 04/2021 Patient had spirometry March 2023, Continue home inhalers   Chronic diastolic heart failure [facility]) Patient appears clinically euvolemic a Continue GDMT torsemide, Jardiance as well as atenolol and Imdur EF 09/02/21 60 to 65%, G2 DD.    CKD 3a-b chronic kidney disease (HCC) At baseline Monitor renal function closely   Type 2 diabetes mellitus with long-term current use of insulin (HCC) Complicated by diabetic neuropathy Continue gabapentin Continue home basal insulin, Tradjenta with sliding scale coverage   Morbid obesity (HCC) Complicating factor to overall prognosis and care Counseled on weight loss when medically stable   Status post placement of cardiac pacemaker No acute issues suspected Followed by Dr. Graciela Husbands   Hypothyroidism Continue levothyroxine   HTN (hypertension) Continue atenolol.   OSA on CPAP CPAP nightly       DVT prophylaxis: Lovenox   Consults: none   Advance Care Planning:   Code Status: Prior    Family Communication: none   Disposition Plan: Back to previous home environment    Physical Exam: Vitals and nursing note reviewed.  Constitutional: Elderly female sitting up in a chair in no acute distress but appears lethargic HENT:     Head: Normocephalic and atraumatic.  Cardiovascular:     Rate and Rhythm: Regular rhythm.  Tachycardia improving    Heart sounds: Normal heart sounds.  Pulmonary: Tachypnea improved no wheezing noted Abdominal:     Palpations: Abdomen is soft.     Tenderness:  There is no abdominal tenderness.  Neurological:     Mental Status: Mental status is at baseline.   Vitals:   10/30/22 0930 10/30/22 1000 10/30/22 1100 10/30/22 1213  BP: 136/81 119/63 (!) 109/55   Pulse: (!) 109 72 76    Resp: 15 17 15    Temp:    97.9 F (36.6 C)  TempSrc:    Oral  SpO2: 100% 96% 98%   Weight:      Height:        Data Reviewed: I have reviewed patient's chest imaging that did not show any evidence of infiltrate: D-dimer slightly elevated: I have also reviewed patient's CBC as well as CMP results as shown below     Latest Ref Rng & Units 10/30/2022    4:19 AM 10/29/2022    5:16 PM 08/12/2022   11:40 AM  CBC  WBC 4.0 - 10.5 K/uL 6.2  7.0  7.0   Hemoglobin 12.0 - 15.0 g/dL 16.1  09.6  04.5   Hematocrit 36.0 - 46.0 % 37.9  39.8  38.5   Platelets 150 - 400 K/uL 234  227  247        Latest Ref Rng & Units 10/30/2022    9:15 AM 10/30/2022    4:19 AM 10/29/2022    5:16 PM  BMP  Glucose 70 - 99 mg/dL 409   811   BUN 8 - 23 mg/dL 18   21   Creatinine 9.14 - 1.00 mg/dL 7.82  9.56  2.13   Sodium 135 - 145 mmol/L 138   138   Potassium 3.5 - 5.1 mmol/L 3.2   3.2   Chloride 98 - 111 mmol/L 100   98   CO2 22 - 32 mmol/L 25   27   Calcium 8.9 - 10.3 mg/dL 8.9   8.6      Disposition: Status is: Inpatient Remains inpatient appropriate because: As patient is an unsafe discharge pending PT OT eval   Author: Loyce Dys, MD 10/30/2022 2:16 PM  For on call review [email or web address].   Addendum 4:41 PM According to patient she only takes torsemide at home when her weight goes up and is requesting to hold off at this time.  Addendum 6:20 PM CT angio of the chest as well as Doppler ultrasound of the lower extremity did not show PE or DVT respectively.

## 2022-10-30 NOTE — ED Notes (Signed)
Husband at bedside at this time.

## 2022-10-30 NOTE — ED Provider Notes (Signed)
[facility] Provider Note    Event Date/Time   First MD Initiated Contact with Patient 10/29/22 2334     (approximate)   History   Shortness of Breath   HPI  [name] is a 77 y.o. female with history of chronic kidney disease, CHF with preserved ejection fraction, hypothyroidism, hypertension, hyperlipidemia, symptomatic bradycardia status post pacemaker who presents to the emergency department with fevers, cough, congestion and shortness of breath.  Was diagnosed with COVID-19 4 days ago and is on molnupiravir.  No vomiting or diarrhea.  States she had oxygen saturations in the 70s at home.  Does not wear oxygen chronically.  No history of COPD, asthma.   History provided by patient, family.    Past Medical History:  Diagnosis Date   Acquired elevated diaphragm    HIGHER ON RIGHT SIDE   Anemia    Anxiety    Aortic atherosclerosis (HCC)    Arthritis    CAD (coronary artery disease)    a. 07/2017 MV: Low risk; b. 09/2017 Cath: LM nl, LAD 50p/m, D1 80ost, LCX large/nl, RCA small/nl.   Chronic back pain    stenosis.degenerative disc,some scoliosis   Chronic heart failure with preserved ejection fraction (HFpEF) (HCC)    a. 07/2017 Echo: EF 60-65%, no rwma, gr2 DD, nl RV fxn.   Chronic kidney disease    stage 3   Complication of anesthesia 2018   aspirated during wrist surgery during LMA removal   Constipation    takes Stool Softener daily   Coronary artery disease    Depression    takes Cymbalta daily   Diabetes mellitus    Type 2 diabetic. Average fasting blood sugar runs high 170-200   Diastolic CHF (HCC) 07/17/2017   Per patient, diagnosed in 2018   Diverticulosis    Dysplastic nevus 09/12/2017   L sup buttock - mild    Dysplastic nevus 09/18/2018   R sup med buttocks - mild    E coli infection    GERD (gastroesophageal reflux disease)    takes Nexium daily   Grade II diastolic dysfunction    Headache    Hemorrhoids    History  of colon polyps    benign   History of gout    doesn't take any meds   History of hiatal hernia    small   History of vertigo    doesn't take any meds   Hyperlipidemia    takes Praluent daily   Hypertension    currently BP medications are on hold    Hypothyroidism    takes Synthroid daily   Insomnia    takes Restoril nightly   Muscle spasm    takes Robaxin as needed   NSVT (nonsustained ventricular tachycardia) (HCC)    OSA on CPAP    Paroxysmal atrial tachycardia    Peripheral vascular disease (HCC)    AAA as stated per pt / was just discovered and pt states has not been referred to vascular MD    Presence of permanent cardiac pacemaker    Restless leg    takes Requip daily   Rosacea    Symptomatic bradycardia    a. 02/2019 s/p Biotronik Edora 8 DR-T DC PPM   Syncope    a. Documented bradycardia and pauses-->02/2019 s/p Biotronik Edora 8 DR-T DC PPM.   Varicose veins     Past Surgical History:  Procedure Laterality Date   ABDOMINAL HYSTERECTOMY     with BSo  BACK SURGERY     CARDIAC CATHETERIZATION  2013   Normal   CARPAL TUNNEL RELEASE Bilateral    CATARACT EXTRACTION, BILATERAL     CHOLECYSTECTOMY     COLONOSCOPY     COLONOSCOPY WITH PROPOFOL N/A 11/25/2018   Procedure: COLONOSCOPY WITH PROPOFOL;  Surgeon: Toledo, Boykin Nearing, MD;  Location: [facility] ENDOSCOPY;  Service: Gastroenterology;  Laterality: N/A;   DIAGNOSTIC LAPAROSCOPY     multiple times   DILATION AND CURETTAGE OF UTERUS     ESOPHAGOGASTRODUODENOSCOPY (EGD) WITH PROPOFOL N/A 11/01/2014   Procedure: ESOPHAGOGASTRODUODENOSCOPY (EGD) WITH PROPOFOL;  Surgeon: Wallace Cullens, MD;  Location: [facility] ENDOSCOPY;  Service: Gastroenterology;  Laterality: N/A;   ESOPHAGOGASTRODUODENOSCOPY (EGD) WITH PROPOFOL N/A 11/25/2018   Procedure: ESOPHAGOGASTRODUODENOSCOPY (EGD) WITH PROPOFOL;  Surgeon: Toledo, Boykin Nearing, MD;  Location: [facility] ENDOSCOPY;  Service: Gastroenterology;  Laterality: N/A;   ESOPHAGOGASTRODUODENOSCOPY (EGD)  WITH PROPOFOL N/A 07/11/2022   Procedure: ESOPHAGOGASTRODUODENOSCOPY (EGD) WITH PROPOFOL;  Surgeon: Toledo, Boykin Nearing, MD;  Location: [facility] ENDOSCOPY;  Service: Gastroenterology;  Laterality: N/A;  IDDM   HARDWARE REMOVAL Left 10/11/2016   Procedure: HARDWARE REMOVAL-LEFT RADIUS;  Surgeon: Lyndle Herrlich, MD;  Location: [facility] ORS;  Service: Orthopedics;  Laterality: Left;  Left Radius Wrist    IMPLANTABLE CONTACT LENS IMPLANTATION     bilateral   KNEE ARTHROSCOPY Right 05/09/2020   Procedure: ARTHROSCOPY KNEE;  Surgeon: Donato Heinz, MD;  Location: [facility] ORS;  Service: Orthopedics;  Laterality: Right;   LAMINECTOMY  11/13/2015   LEFT HEART CATH AND CORONARY ANGIOGRAPHY Left 10/01/2017   Procedure: LEFT HEART CATH AND CORONARY ANGIOGRAPHY;  Surgeon: Yvonne Kendall, MD;  Location: [facility] INVASIVE CV LAB;  Service: Cardiovascular;  Laterality: Left;   LOOP RECORDER INSERTION N/A 10/17/2017   Procedure: LOOP RECORDER INSERTION;  Surgeon: Duke Salvia, MD;  Location: [facility] INVASIVE CV LAB;  Service: Cardiovascular;  Laterality: N/A;   LOOP RECORDER REMOVAL N/A 03/04/2019   Procedure: LOOP RECORDER REMOVAL;  Surgeon: Duke Salvia, MD;  Location: [facility] INVASIVE CV LAB;  Service: Cardiovascular;  Laterality: N/A;   LUMBAR FUSION  11/2015   LUMBAR WOUND DEBRIDEMENT N/A 12/02/2015   Procedure: WOUND Exploration;  Surgeon: Lisbeth Renshaw, MD;  Location: [facility] OR;  Service: Neurosurgery;  Laterality: N/A;   OPEN REDUCTION INTERNAL FIXATION (ORIF) DISTAL RADIAL FRACTURE Left 08/29/2016   Procedure: OPEN REDUCTION INTERNAL FIXATION (ORIF) DISTAL RADIAL FRACTURE;  Surgeon: Lyndle Herrlich, MD;  Location: [facility] ORS;  Service: Orthopedics;  Laterality: Left;   PACEMAKER IMPLANT N/A 03/04/2019   Procedure: PACEMAKER IMPLANT;  Surgeon: Duke Salvia, MD;  Location: [facility] INVASIVE CV LAB;  Service: Cardiovascular;  Laterality: N/A;   PICC LINE PLACE PERIPHERAL ([facility] HX)     right upper arm    RIGHT/LEFT HEART  CATH AND CORONARY ANGIOGRAPHY N/A 12/26/2021   Procedure: RIGHT/LEFT HEART CATH AND CORONARY ANGIOGRAPHY;  Surgeon: Yvonne Kendall, MD;  Location: [facility] INVASIVE CV LAB;  Service: Cardiovascular;  Laterality: N/A;   ROTATOR CUFF REPAIR Right    SAVORY DILATION N/A 11/01/2014   Procedure: SAVORY DILATION;  Surgeon: Wallace Cullens, MD;  Location: [facility] ENDOSCOPY;  Service: Gastroenterology;  Laterality: N/A;   TONSILLECTOMY     TRIGGER FINGER RELEASE Bilateral     MEDICATIONS:  Prior to Admission medications   Medication Sig Start Date End Date Taking? Authorizing Provider  molnupiravir EUA (LAGEVRIO) 200 MG CAPS capsule Take 4 capsules by mouth 2 (two) times daily. 10/26/22 10/31/22 Yes [provider]  Acetaminophen (  TYLENOL ARTHRITIS PAIN PO) Take 1,300 mg by mouth every 8 (eight) hours as needed (pain).    [provider]  albuterol (PROVENTIL) (2.5 MG/3ML) 0.083% nebulizer solution Take 3 mLs (2.5 mg total) by nebulization every 4 (four) hours as needed for wheezing or shortness of breath. 03/27/22   Erin Fulling, MD  albuterol (VENTOLIN HFA) 108 (90 Base) MCG/ACT inhaler Inhale 1 puff into the lungs every 4 (four) hours as needed. 01/23/22 01/23/23  [provider]  aspirin 81 MG tablet Take 81 mg by mouth at bedtime.     [provider]  atenolol (TENORMIN) 25 MG tablet Take 1 tablet (25 mg total) by mouth every morning. 12/27/21   Tresa Moore, MD  azelastine (ASTELIN) 0.1 % nasal spray Place 2 sprays into both nostrils 2 (two) times daily as needed for rhinitis.    [provider]  cholecalciferol (VITAMIN D3) 25 MCG (1000 UNIT) tablet Take 1,000 Units by mouth daily.    [provider]  Crisaborole (EUCRISA) 2 % OINT Apply 1 Application topically as directed. qd to bid to aa eczema on back as needed for flares 09/27/22   Deirdre Evener, MD  diclofenac Sodium (VOLTAREN) 1 % GEL Apply 2 g topically 2 (two) times daily.    [provider]  doxycycline (ORACEA) 40 MG capsule Take 1 capsule (40 mg total) by mouth every morning. 07/04/22   Deirdre Evener, MD  doxycycline (ORACEA) 40 MG capsule Take 1 capsule (40 mg total) by mouth daily. Take with food and drink 09/27/22   Deirdre Evener, MD  doxycycline (PERIOSTAT) 20 MG tablet Take 1 tablet (20 mg total) by mouth 2 (two) times daily. 07/19/22   Deirdre Evener, MD  empagliflozin (JARDIANCE) 10 MG TABS tablet Take 10 mg by mouth daily. 12/15/21 12/15/22  [provider]  escitalopram (LEXAPRO) 5 MG tablet Take 5 mg by mouth at bedtime. 06/07/22 06/07/23  [provider]  esomeprazole (NEXIUM) 40 MG capsule Take 40 mg by mouth 2 (two) times daily.    [provider]  gabapentin (NEURONTIN) 300 MG capsule Take 300 mg by mouth 2 (two) times daily.    [provider]  Insulin Aspart (NOVOLOG FLEXPEN [place]) Inject 10 Units into the skin as needed (diabetes).    [provider]  Ivermectin 1 % CREA Apply 1 Application topically as directed. qd to bid to face for Rosacea 09/27/22   Deirdre Evener, MD  LANTUS SOLOSTAR 100 UNIT/ML Solostar Pen Inject 28 Units into the skin every morning. 06/27/20   [provider]  levothyroxine (SYNTHROID, LEVOTHROID) 75 MCG tablet Take 75 mcg by mouth daily before breakfast.  11/24/12   [provider]  methocarbamol (ROBAXIN) 500 MG tablet Take 500 mg by mouth every 8 (eight) hours as needed for muscle spasms.    [provider]  metolazone (ZAROXOLYN) 2.5 MG tablet Take 2.5 mg by mouth daily as needed (fluid overload). 12/29/21   [provider]  mometasone (ELOCON) 0.1 % cream Apply 1 application topically daily as needed (Rash). Qd to bid up to 5 days a week aa right lower leg prn itching 06/22/20   Deirdre Evener, MD  nitroGLYCERIN (NITROSTAT) 0.4 MG SL tablet Place 1 tablet (0.4 mg total) under the tongue every 5 (five) minutes as needed for chest pain. 07/13/21  01/25/23  Antonieta Iba, MD  ondansetron (ZOFRAN) 4 MG tablet Take 4 mg by mouth every 8 (  eight) hours as needed for nausea or vomiting.  07/19/17   [provider]  ondansetron (ZOFRAN-ODT) 4 MG disintegrating tablet Take 1 tablet (4 mg total) by mouth every 6 (six) hours as needed for nausea or vomiting. Patient not taking: Reported on 06/05/2022 05/06/21   Sharyn Creamer, MD  potassium chloride (KLOR-CON) 10 MEQ tablet Take 2 tablet (20 meq) by mouth twice daily as directed. Take an additional 2 tablets ( 20 meg) with extra dose or Torsemide. Take 2 tablets (20 meq) when taking Metolazone. 08/14/22   Antonieta Iba, MD  PRALUENT 150 MG/ML SOAJ INJECT 150 MG UNDER THE SKIN EVERY 14 DAYS 12/28/21   Antonieta Iba, MD  ranolazine (RANEXA) 500 MG 12 hr tablet Take 2 tablets (1,000 mg total) by mouth 2 (two) times daily. 08/18/21   Dunn, Raymon Mutton, PA-C  ropinirole (REQUIP) 5 MG tablet Take 5 mg by mouth at bedtime.    [provider]  temazepam (RESTORIL) 30 MG capsule Take 30 mg by mouth at bedtime.    [provider]  torsemide (DEMADEX) 20 MG tablet TAKE 2 TABLETS BY MOUTH TWICE A DAY AS DIRECTED 08/24/22   Antonieta Iba, MD  traMADol (ULTRAM) 50 MG tablet Take 50 mg by mouth every 6 (six) hours as needed for moderate pain or severe pain.    [provider]  triamcinolone cream (KENALOG) 0.1 % Apply to affected skin qd-bid 5 days a week prn flares. Avoid applying to face, groin, and axilla. Use as directed. Long-term use can cause thinning of the skin. 07/04/22   Deirdre Evener, MD  trolamine salicylate (ASPERCREME) 10 % cream Apply 1 application topically at bedtime.    [provider]    Physical Exam   Triage Vital Signs: ED Triage Vitals  Encounter Vitals Group     BP 10/29/22 1702 (!) 175/75     Systolic BP Percentile --      Diastolic BP Percentile --      Pulse Rate 10/29/22 1702 97     Resp 10/29/22 1702 18     Temp 10/29/22 1702 98.4  F (36.9 C)     Temp Source 10/29/22 1702 Oral     SpO2 10/29/22 1702 96 %     Weight 10/29/22 1703 195 lb (88.5 kg)     Height 10/29/22 1703 5\' 1"  (1.549 m)     Head Circumference --      Peak Flow --      Pain Score 10/29/22 1703 7     Pain Loc --      Pain Education --      Exclude from Growth Chart --     Most recent vital signs: Vitals:   10/30/22 0045 10/30/22 0100  BP:  121/64  Pulse: 86 89  Resp: (!) 29 17  Temp:    SpO2: 96% 97%    CONSTITUTIONAL: Alert, responds appropriately to questions. Well-appearing; well-nourished HEAD: Normocephalic, atraumatic EYES: Conjunctivae clear, pupils appear equal, sclera nonicteric ENT: normal nose; moist mucous membranes NECK: Supple, normal ROM CARD: RRR; S1 and S2 appreciated RESP: Normal chest excursion without splinting or tachypnea; breath sounds clear and equal bilaterally; no wheezes, no rhonchi, no rales, no hypoxia or respiratory distress, speaking full sentences ABD/GI: Non-distended; soft, non-tender, no rebound, no guarding, no peritoneal signs BACK: The back appears normal EXT: Normal ROM in all joints; no deformity noted, no edema SKIN: Normal color for age and race; warm; no rash on  exposed skin NEURO: Moves all extremities equally, normal speech PSYCH: The patient's mood and manner are appropriate.   ED Results / Procedures / Treatments   LABS: (all labs ordered are listed, but only abnormal results are displayed) Labs Reviewed  CBC WITH DIFFERENTIAL/PLATELET - Abnormal; Notable for the following components:      Result Value   MCH 25.3 (*)    RDW 18.5 (*)    All other components within normal limits  COMPREHENSIVE METABOLIC PANEL - Abnormal; Notable for the following components:   Potassium 3.2 (*)    Glucose, Bld 113 (*)    Creatinine, Ser 1.31 (*)    Calcium 8.6 (*)    AST 14 (*)    GFR, Estimated 42 (*)    All other components within normal limits  TROPONIN I (HIGH SENSITIVITY)  TROPONIN I (HIGH  SENSITIVITY)     EKG:  EKG Interpretation Date/Time:  Monday October 29 2022 17:12:44 EDT Ventricular Rate:  97 PR Interval:  132 QRS Duration:  158 QT Interval:  364 QTC Calculation: 462 R Axis:   31  Text Interpretation: Normal sinus rhythm Non-specific intra-ventricular conduction block Abnormal ECG When compared with ECG of 14-Aug-2022 10:21, QRS duration has increased Nonspecific T wave abnormality, worse in Lateral leads Confirmed by Rochele Raring [phone]) on 10/29/2022 11:56:18 PM         RADIOLOGY: My personal review and interpretation of imaging: Chest x-ray clear.  I have personally reviewed all radiology reports.   DG Chest 2 View  Result Date: 10/29/2022 CLINICAL DATA:  Shortness of breath.  COVID EXAM: CHEST - 2 VIEW COMPARISON:  X-ray 08/12/2022 FINDINGS: Eventration of the right hemidiaphragm. Left upper chest pacemaker. No consolidation, pneumothorax or effusion. No edema. Normal cardiopericardial silhouette. Calcified aorta. Degenerative changes of the spine. Surgical clips in the right upper quadrant. IMPRESSION: Pacemaker.  No acute cardiopulmonary disease. Electronically Signed   By: Karen Kays M.D.   On: 10/29/2022 17:35     PROCEDURES:  Critical Care performed: YES   CRITICAL CARE Performed by: Baxter Hire Amariana Mirando   Total critical care time: 30 minutes  Critical care time was exclusive of separately billable procedures and treating other patients.  Critical care was necessary to treat or prevent imminent or life-threatening deterioration.  Critical care was time spent personally by me on the following activities: development of treatment plan with patient and/or surrogate as well as nursing, discussions with consultants, evaluation of patient's response to treatment, examination of patient, obtaining history from patient or surrogate, ordering and performing treatments and interventions, ordering and review of laboratory studies, ordering and review of  radiographic studies, pulse oximetry and re-evaluation of patient's condition.   [name]1-3 Lead EKG Interpretation  Performed by: [place] Hanlon, Layla Maw, DO Authorized by: Tressy Kunzman, Layla Maw, DO     Interpretation: normal     ECG rate:  89   ECG rate assessment: normal     Rhythm: sinus rhythm     Ectopy: none     Conduction: normal       IMPRESSION / MDM / ASSESSMENT AND PLAN / ED COURSE  I reviewed the triage vital signs and the nursing notes.    Patient here with shortness of breath, viral URI symptoms in the setting of COVID-19.  The patient is on the cardiac monitor to evaluate for evidence of arrhythmia and/or significant heart rate changes.   DIFFERENTIAL DIAGNOSIS (includes but not limited to):   COVID-19, viral pneumonia, CHF exacerbation, PE   Patient's  presentation is most consistent with acute presentation with potential threat to life or bodily function.   PLAN: Workup initiated from triage.  Labs show no leukocytosis, normal hemoglobin.  Creatinine stable at 1.3.  Troponin negative.  Chest x-ray reviewed and interpreted by myself and the radiologist and shows no acute abnormality.  Will obtain ambulatory sat here in the ED.   MEDICATIONS GIVEN IN ED: Medications - No data to display   ED COURSE: Patient had no hypoxia here with ambulation however not able to ambulate for given she was so unsteady on her feet.  Will discuss with hospitalist for admission for monitoring given sats in the 70s at home.   CONSULTS:  Consulted and discussed patient's case with hospitalist, Dr. Para March.  I have recommended admission and consulting physician agrees and will place admission orders.  Patient (and family if present) agree with this plan.   I reviewed all nursing notes, vitals, pertinent previous records.  All labs, EKGs, imaging ordered have been independently reviewed and interpreted by myself.    OUTSIDE RECORDS REVIEWED: Reviewed last PCP note on 10/26/2022.       FINAL  CLINICAL IMPRESSION(S) / ED DIAGNOSES   Final diagnoses:  COVID-19  Generalized weakness  Acute respiratory failure with hypoxia (HCC)     Rx / DC Orders   ED Discharge Orders     None        Note:  This document was prepared using Dragon voice recognition software and may include unintentional dictation errors.   Sahan Pen, Layla Maw, DO 10/30/22 [phone]

## 2022-10-30 NOTE — ED Notes (Signed)
Called dietary regarding patient's breakfast tray. Dietary informed this RN that they are running behind and still working on trays.

## 2022-10-30 NOTE — ED Notes (Signed)
Pt ambulated with standby assist (usually uses cane), sats went remained between 95-97%. NAD noted

## 2022-10-30 NOTE — ED Notes (Addendum)
Per previous RN pt reports calf pain, hospitalist messaged due and informed. No new orders at this time

## 2022-10-30 NOTE — ED Notes (Signed)
Messaged provider regarding patient's frequent PVC's

## 2022-10-31 DIAGNOSIS — Z8616 Personal history of COVID-19: Secondary | ICD-10-CM

## 2022-10-31 DIAGNOSIS — U071 COVID-19: Secondary | ICD-10-CM | POA: Diagnosis not present

## 2022-10-31 HISTORY — DX: Personal history of COVID-19: Z86.16

## 2022-10-31 LAB — GLUCOSE, CAPILLARY
Glucose-Capillary: 117 mg/dL — ABNORMAL HIGH (ref 70–99)
Glucose-Capillary: 138 mg/dL — ABNORMAL HIGH (ref 70–99)

## 2022-10-31 NOTE — Discharge Summary (Signed)
Physician Discharge Summary  KINARA BOBIAN LKG:[id number] DOB: April 03, 1945 DOA: 10/29/2022  PCP: Danella Penton, MD  Admit date: 10/29/2022 Discharge date: 10/31/2022  Admitted From: home  Disposition:  home   Recommendations for Outpatient Follow-up:  Follow up with PCP in 1-2 weeks  Home Health: no  Equipment/Devices:  Discharge Condition: stable  CODE STATUS: full  Diet recommendation: Heart Healthy / Carb Modified  Brief/Interim Summary: HPI was taken from Dr. Para March: [name] is a 77 y.o. female with medical history significant for Class III obesity, diastolic CHF, CKD 3 A, OSA on CPAP, insulin-dependent type 2 DM, HTN, s/p pacemaker 2021, CAD with nonobstructive disease on CT coronary 6/23 who was brought in by EMS for evaluation of persistent shortness of breath and protracted weakness in the setting of COVID infection for which she is on day 4 of molnupiravir.  She was checking her O2 sats at home and it was in the 70s does bring her to the ED.  She denies fever and chills. ED course and data reviewed: O2 sat in the ED remained normal on room air and with ambulation.  Vitals were otherwise unremarkable. Labs: CBC Unremarkable.  CMP notable for mild hypokalemia of 3.2.  Creatinine was at baseline at 1.31. EKG, personally viewed and interpreted showed NSR at 97 with nonspecific ST-T wave changes Chest x-ray with no acute cardiopulmonary disease. 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 Continue on incentive spirometry. No requiring supplemental oxygen and bronchodilators.   Right calf pain: No evidence of RLE DVT on [place]   Hx of CAD: Coronary CTA 07/2021 showed nonobstructive disease with normal FFR.  Not on statin therapy secondary to allergy. Continue on aspirin   Restrictive lung disease: continue on bronchodilators    Chronic diastolic heart failure: appears euvolemic. Continue on torsemide, atenolol, imdur. Echo in 08/2021 shows EF 60-65%, grade II diastolic dysfunction   CKDIIIa: Cr is labile. Avoid nephrotoxic meds   DM2: poorly controlled. Continue on home dose of jardiance   Peripheral neuropathy: continue on gabapentin   Morbid obesity: BMI 36.8 w/ multiple other co-morbid conditions. Complicates overall care & prognosis    Hx of cardiac pacemaker: no acute issues    Hypothyroidism: continue on levothyroxine    HTN: continue on atenolol    OSA: continue on CPAP    Discharge Instructions  Discharge Instructions     Diet - low sodium heart healthy   Complete by: As directed    Diet Carb Modified   Complete by: As directed    Discharge instructions   Complete by: As directed    F/u w/ PCP in 1-2 weeks   Increase activity slowly   Complete by: As directed       Allergies as of 10/31/2022       Reactions   Ezetimibe Diarrhea, Nausea Only   Hydrocodone-acetaminophen Other (See Comments)   Metoclopramide Diarrhea   Propoxyphene Other (See Comments)   Unsure of reaction type Unsure  of reaction type Unsure of reaction type   Cefuroxime    Hydrocodone Other (See Comments)   Tramadol Hives   Ceftin [cefuroxime Axetil] Diarrhea   Codeine Rash       Penicillins Rash, Other (See Comments)   Has patient had a PCN reaction causing immediate rash, facial/tongue/throat swelling, SOB or lightheadedness with hypotension: NO Has patient had a PCN reaction causing severe rash involving mucus membranes or skin  necrosis: NO Has patient had a PCN retioion that required hospitalization NO Has patient had a PCN reaction occurring within the last 10 years: NO If all of the above answers are "NO", then may proceed with Cephalosporin use.   Statins Other (See Comments)   Leg pain Leg pain Other reaction(s): restless leg syndrome   Vicodin [hydrocodone-acetaminophen] Other (See Comments)   passes out        Medication List     STOP taking these medications    azelastine 0.1 % nasal spray Commonly known as: ASTELIN   Ivermectin 1 % Crea   ondansetron 4 MG disintegrating tablet Commonly known as: ZOFRAN-ODT   triamcinolone cream 0.1 % Commonly known as: KENALOG       TAKE these medications    albuterol 108 (90 Base) MCG/ACT inhaler Commonly known as: VENTOLIN HFA Inhale 1 puff into the lungs every 4 (four) hours as needed.   albuterol (2.5 MG/3ML) 0.083% nebulizer solution Commonly known as: PROVENTIL Take 3 mLs (2.5 mg total) by nebulization every 4 (four) hours as needed for wheezing or shortness of breath.   aspirin 81 MG tablet Take 81 mg by mouth at bedtime.   atenolol 25 MG tablet Commonly known as: TENORMIN Take 1 tablet (25 mg total) by mouth every morning. What changed:  how much to take when to take this   cholecalciferol 25 MCG (1000 UNIT) tablet Commonly known as: VITAMIN D3 Take 1,000 Units by mouth daily.   diclofenac Sodium 1 % Gel Commonly known as: VOLTAREN Apply 2 g topically 2 (two) times daily.   doxycycline 20 MG tablet Commonly known as: PERIOSTAT Take 1 tablet (20 mg total) by mouth 2 (two) times daily.   empagliflozin 10 MG Tabs tablet Commonly known as: JARDIANCE Take 10 mg by mouth daily.   escitalopram 5 MG tablet Commonly known as: LEXAPRO Take 5 mg by mouth at bedtime.   esomeprazole 40 MG capsule Commonly known as: NEXIUM Take 40 mg by mouth 2 (two) times daily.   Eucrisa 2 % Oint Generic drug: Crisaborole Apply 1  Application topically as directed. qd to bid to aa eczema on back as needed for flares   gabapentin 300 MG capsule Commonly known as: NEURONTIN Take 300 mg by mouth 2 (two) times daily.   Lantus SoloStar 100 UNIT/ML Solostar Pen Generic drug: insulin glargine Inject 28 Units into the skin every morning.   levothyroxine 75 MCG tablet Commonly known as: SYNTHROID Take 75 mcg by mouth daily before breakfast.   methocarbamol 500 MG tablet Commonly known as: ROBAXIN Take 500 mg by mouth every 8 (eight) hours as needed for muscle spasms.   metolazone 2.5 MG tablet Commonly known as: ZAROXOLYN Take 2.5 mg by mouth daily as needed (fluid overload). Weight over 215 lb   molnupiravir EUA 200 MG Caps capsule Commonly known as: LAGEVRIO Take 4 capsules by mouth 2 (two) times daily.   mometasone 0.1 % cream Commonly known as: ELOCON Apply 1 application topically daily as needed (Rash). Qd to bid up to  5 days a week aa right lower leg prn itching   nitroGLYCERIN 0.4 MG SL tablet Commonly known as: NITROSTAT Place 1 tablet (0.4 mg total) under the tongue every 5 (five) minutes as needed for chest pain.   NOVOLOG FLEXPEN [place] Inject 10 Units into the skin as needed (diabetes).   ondansetron 4 MG tablet Commonly known as: ZOFRAN Take 4 mg by mouth every 8 (eight) hours as needed for nausea or vomiting.   Ozempic (0.25 or 0.5 MG/DOSE) 2 MG/1.5ML Sopn Generic drug: Semaglutide(0.25 or 0.5MG /DOS) Inject 0.5 mg into the skin once a week. On Fridays   potassium chloride 10 MEQ tablet Commonly known as: KLOR-CON Take 2 tablet (20 meq) by mouth twice daily as directed. Take an additional 2 tablets ( 20 meg) with extra dose or Torsemide. Take 2 tablets (20 meq) when taking Metolazone.   Praluent 150 MG/ML [place] Generic drug: Alirocumab INJECT 150 MG UNDER THE SKIN EVERY 14 DAYS   ranolazine 500 MG 12 hr tablet Commonly known as: RANEXA Take 2 tablets (1,000 mg total) by mouth 2 (two) times  daily. What changed: how much to take   ropinirole 5 MG tablet Commonly known as: REQUIP Take 5 mg by mouth every evening.   temazepam 30 MG capsule Commonly known as: RESTORIL Take 30 mg by mouth at bedtime.   torsemide 20 MG tablet Commonly known as: DEMADEX TAKE 2 TABLETS BY MOUTH TWICE A DAY AS DIRECTED   traMADol 50 MG tablet Commonly known as: ULTRAM Take 50 mg by mouth every 6 (six) hours as needed for moderate pain or severe pain.   trolamine salicylate 10 % cream Commonly known as: ASPERCREME Apply 1 application topically at bedtime.   TYLENOL ARTHRITIS PAIN PO Take 1,300 mg by mouth every 8 (eight) hours as needed (pain).        Allergies  Allergen Reactions   Ezetimibe Diarrhea and Nausea Only   Hydrocodone-Acetaminophen Other (See Comments)   Metoclopramide Diarrhea   Propoxyphene Other (See Comments)    Unsure of reaction type Unsure of reaction type Unsure of reaction type   Cefuroxime    Hydrocodone Other (See Comments)   Tramadol Hives   Ceftin [Cefuroxime Axetil] Diarrhea   Codeine Rash        Penicillins Rash and Other (See Comments)    Has patient had a PCN reaction causing immediate rash, facial/tongue/throat swelling, SOB or lightheadedness with hypotension: NO Has patient had a PCN reaction causing severe rash involving mucus membranes or skin necrosis: NO Has patient had a PCN retioion that required hospitalization NO Has patient had a PCN reaction occurring within the last 10 years: NO If all of the above answers are "NO", then may proceed with Cephalosporin use.   Statins Other (See Comments)    Leg pain Leg pain Other reaction(s): restless leg syndrome   Vicodin [Hydrocodone-Acetaminophen] Other (See Comments)    passes out    Consultations:   Procedures/Studies: CT Angio Chest Pulmonary Embolism (PE) W or WO Contrast  Result Date: 10/30/2022 CLINICAL DATA:  Chronic dyspnea, history of CHF, fever and cough, congestion and  short of breath EXAM: CT ANGIOGRAPHY CHEST WITH CONTRAST TECHNIQUE: Multidetector CT imaging of the chest was performed using the standard protocol during bolus administration of intravenous contrast. Multiplanar CT image reconstructions and MIPs were obtained to evaluate the vascular anatomy. RADIATION DOSE REDUCTION: This exam was performed according to the departmental dose-optimization program which includes automated exposure control, adjustment of the mA and/or  kV according to patient size and/or use of iterative reconstruction technique. CONTRAST:  75mL OMNIPAQUE IOHEXOL 350 MG/ML SOLN COMPARISON:  10/29/2022, 08/10/2021, 09/07/2020. FINDINGS: Cardiovascular: This is a technically adequate evaluation of the pulmonary vasculature. No filling defects or pulmonary emboli. The heart is unremarkable without pericardial effusion. Dual lead pacer is noted. No evidence of thoracic aortic aneurysm or dissection. Atherosclerosis of the aorta and coronary vasculature. Mediastinum/Nodes: Stable heterogeneous enlargement and nodularity throughout the left lobe thyroid. Small hiatal hernia. Trachea and esophagus are unremarkable. No pathologic adenopathy. Lungs/Pleura: No acute airspace disease, effusion, or pneumothorax. Central airways are patent. Upper Abdomen: No acute abnormality. Musculoskeletal: No acute or destructive bony abnormalities. Reconstructed images demonstrate no additional findings. Review of the MIP images confirms the above findings. IMPRESSION: 1. No evidence of pulmonary embolus. 2. No acute intrathoracic process. 3. Small hiatal hernia. 4. Stable heterogeneous enlargement and nodularity throughout the left lobe thyroid. Nonemergent outpatient thyroid ultrasound recommended if not previously performed. 5. Aortic Atherosclerosis (ICD10-I70.0). Coronary artery atherosclerosis. Electronically Signed   By: Sharlet Salina M.D.   On: 10/30/2022 16:12   US Venous Img Lower Unilateral Right (DVT)  Result  Date: 10/30/2022 CLINICAL DATA:  Right calf pain. EXAM: RIGHT LOWER EXTREMITY VENOUS DOPPLER ULTRASOUND TECHNIQUE: Gray-scale sonography with graded compression, as well as color Doppler and duplex ultrasound were performed to evaluate the lower extremity deep venous systems from the level of the common femoral vein and including the common femoral, femoral, profunda femoral, popliteal and calf veins including the posterior tibial, peroneal and gastrocnemius veins when visible. The superficial great saphenous vein was also interrogated. Spectral Doppler was utilized to evaluate flow at rest and with distal augmentation maneuvers in the common femoral, femoral and popliteal veins. COMPARISON:  09/07/2020 FINDINGS: Contralateral Common Femoral Vein: Respiratory phasicity is normal and symmetric with the symptomatic side. No evidence of thrombus. Normal compressibility. Common Femoral Vein: No evidence of thrombus. Normal compressibility, respiratory phasicity and response to augmentation. Saphenofemoral Junction: No evidence of thrombus. Normal compressibility and flow on color Doppler imaging. Profunda Femoral Vein: No evidence of thrombus. Normal compressibility and flow on color Doppler imaging. Femoral Vein: No evidence of thrombus. Normal compressibility, respiratory phasicity and response to augmentation. Popliteal Vein: No evidence of thrombus. Normal compressibility, respiratory phasicity and response to augmentation. Calf Veins: No evidence of thrombus. Normal compressibility and flow on color Doppler imaging. Superficial Great Saphenous Vein: No evidence of thrombus. Normal compressibility. Venous Reflux:  None. Other Findings: No evidence of superficial thrombophlebitis or abnormal fluid collection. IMPRESSION: No evidence of right lower extremity deep venous thrombosis. Electronically Signed   By: Irish Lack M.D.   On: 10/30/2022 15:53   DG Chest 2 View  Result Date: 10/29/2022 CLINICAL DATA:   Shortness of breath.  COVID EXAM: CHEST - 2 VIEW COMPARISON:  X-ray 08/12/2022 FINDINGS: Eventration of the right hemidiaphragm. Left upper chest pacemaker. No consolidation, pneumothorax or effusion. No edema. Normal cardiopericardial silhouette. Calcified aorta. Degenerative changes of the spine. Surgical clips in the right upper quadrant. IMPRESSION: Pacemaker.  No acute cardiopulmonary disease. Electronically Signed   By: Karen Kays M.D.   On: 10/29/2022 17:35   (Echo, Carotid, EGD, Colonoscopy, ERCP)    Subjective: Pt denies any complaints    Discharge Exam: Vitals:   10/31/22 0813 10/31/22 0900  BP: (!) 121/53 (!) 109/91  Pulse: 79 80  Resp:    Temp:  98.3 F (36.8 C)  SpO2:  97%   Vitals:   10/30/22 2105 10/31/22  0509 10/31/22 0813 10/31/22 0900  BP: 102/69 127/65 (!) 121/53 (!) 109/91  Pulse: 75 79 79 80  Resp: 18 17    Temp: 97.8 F (36.6 C) 98.2 F (36.8 C)  98.3 F (36.8 C)  TempSrc: Oral Oral  Oral  SpO2: 99% 100%  97%  Weight:      Height:        General: Pt is alert, awake, not in acute distress Cardiovascular: S1/S2 +, no rubs, no gallops Respiratory: CTA bilaterally, no wheezing, no rhonchi Abdominal: Soft, NT, obese, bowel sounds + Extremities: no cyanosis    The results of significant diagnostics from this hospitalization (including imaging, microbiology, ancillary and laboratory) are listed below for reference.     Microbiology: No results found for this or any previous visit (from the past 240 hour(s)).   Labs: BNP (last 3 results) Recent Labs    12/24/21 1855  BNP 21.3   Basic Metabolic Panel: Recent Labs  Lab 10/29/22 1716 10/30/22 0419 10/30/22 0915 10/31/22 0651  NA 138  --  138 140  K 3.2*  --  3.2* 4.3  CL 98  --  100 105  CO2 27  --  25 27  GLUCOSE 113*  --  116* 120*  BUN 21  --  18 13  CREATININE 1.31* 1.27* 1.21* 1.22*  CALCIUM 8.6*  --  8.9 8.3*  MG  --   --  2.4  --    Liver Function Tests: Recent Labs  Lab  10/29/22 1716  AST 14*  ALT 13  ALKPHOS 111  BILITOT 0.8  PROT 7.1  ALBUMIN 3.7   No results for input(s): "LIPASE", "AMYLASE" in the last 168 hours. No results for input(s): "AMMONIA" in the last 168 hours. CBC: Recent Labs  Lab 10/29/22 1716 10/30/22 0419 10/31/22 0651  WBC 7.0 6.2 8.0  NEUTROABS 3.9  --  5.5  HGB 12.2 11.6* 10.5*  HCT 39.8 37.9 34.6*  MCV 82.4 80.3 82.2  PLT 227 234 218   Cardiac Enzymes: No results for input(s): "CKTOTAL", "CKMB", "CKMBINDEX", "TROPONINI" in the last 168 hours. BNP: Invalid input(s): "POCBNP" CBG: Recent Labs  Lab 10/30/22 1203 10/30/22 1634 10/30/22 2103 10/31/22 0753 10/31/22 1207  GLUCAP 159* 89 147* 117* 138*   D-Dimer Recent Labs    10/30/22 0820  DDIMER 0.76*   Hgb A1c Recent Labs    10/29/22 1716  HGBA1C 6.4*   Lipid Profile No results for input(s): "CHOL", "HDL", "LDLCALC", "TRIG", "CHOLHDL", "LDLDIRECT" in the last 72 hours. Thyroid function studies No results for input(s): "TSH", "T4TOTAL", "T3FREE", "THYROIDAB" in the last 72 hours.  Invalid input(s): "FREET3" Anemia work up No results for input(s): "VITAMINB12", "FOLATE", "FERRITIN", "TIBC", "IRON", "RETICCTPCT" in the last 72 hours. Urinalysis    Component Value Date/Time   COLORURINE YELLOW (A) 12/27/2021 1000   APPEARANCEUR HAZY (A) 12/27/2021 1000   LABSPEC 1.024 12/27/2021 1000   PHURINE 5.0 12/27/2021 1000   GLUCOSEU >=500 (A) 12/27/2021 1000   HGBUR NEGATIVE 12/27/2021 1000   BILIRUBINUR NEGATIVE 12/27/2021 1000   BILIRUBINUR negative 05/06/2021 0846   KETONESUR NEGATIVE 12/27/2021 1000   PROTEINUR NEGATIVE 12/27/2021 1000   UROBILINOGEN 0.2 05/06/2021 0846   NITRITE NEGATIVE 12/27/2021 1000   LEUKOCYTESUR NEGATIVE 12/27/2021 1000   Sepsis Labs Recent Labs  Lab 10/29/22 1716 10/30/22 0419 10/31/22 0651  WBC 7.0 6.2 8.0   Microbiology No results found for this or any previous visit (from the past 240 hour(s)).   Time  coordinating discharge: Over 30 minutes  SIGNED:   Charise Killian, MD  [facility] 10/31/2022, 2:07 PM Pager   If 7PM-7AM, please contact night-coverage [email or web address]

## 2022-10-31 NOTE — Progress Notes (Addendum)
PT Cancellation Note  Patient Details Name: [name] MRN: [id number] DOB: 06/14/1945   Cancelled Treatment:    Reason Eval/Treat Not Completed: Other (comment) PT spoke with OT and no current need for PT at this time. Please re-consult if functional status change occurs.   Boe Deans, PT, SPT 10:14 AM,10/31/22

## 2022-10-31 NOTE — Evaluation (Signed)
Occupational Therapy Evaluation Patient Details Name: RYATT STYLE MRN: [id number] DOB: [birth year] Today's Date: 10/31/2022   History of Present Illness [name] is a 77 y.o. female with medical history significant for Class III obesity, diastolic CHF, CKD 3 A, OSA on CPAP, insulin-dependent type 2 DM, HTN, s/p pacemaker 2021, CAD with nonobstructive disease on CT coronary 6/23 who was brought in by EMS for evaluation of persistent shortness of breath and protracted weakness in the setting of COVID infection for which she is on day 4 of molnupiravir.  She was checking her O2 sats at home and it was in the 70s does bring her to the ED.  She denies fever and chills.   Clinical Impression   Pt was seen for OT evaluation this date. Prior to hospital admission, pt was modified independent for ADLs/IADLs. Pt lives at home with her spouse who is able to assist her as needed. Pt twin sister lives across the street and checks on her throughout the day. Pt tolerated ~ 359ft with 1 standing rest break, maintained O2 on room air 98%. Pt reports no ADL needs, will sign off. No OT follow up.      If plan is discharge home, recommend the following: A little help with bathing/dressing/bathroom    Functional Status Assessment  Patient has not had a recent decline in their functional status  Equipment Recommendations  None recommended by OT    Recommendations for Other Services       Precautions / Restrictions Precautions Precaution Comments: Restrictions Weight Bearing Restrictions: No      Mobility Bed Mobility                 Patient Response: Cooperative  Transfers Overall transfer level: Modified independent Equipment used: Rolling walker (2 wheels)                      Balance Overall balance assessment: Mild deficits observed, not formally tested                                         ADL either performed or assessed with clinical judgement    ADL Overall ADL's : Modified independent                                       General ADL Comments: Pt reports no concerns with ADLs, husband and sister able to help as needed     Vision         Perception         Praxis         Pertinent Vitals/Pain Pain Assessment Pain Assessment: Faces Faces Pain Scale: Hurts a little bit Pain Location: Knee pain Pain Descriptors / Indicators: Grimacing Pain Intervention(s): Limited activity within patient's tolerance     Extremity/Trunk Assessment Upper Extremity Assessment Upper Extremity Assessment: Overall WFL for tasks assessed   Lower Extremity Assessment Lower Extremity Assessment: Overall WFL for tasks assessed       Communication Communication Communication: No apparent difficulties   Cognition Arousal: Alert Behavior During Therapy: WFL for tasks assessed/performed Overall Cognitive Status: Within Functional Limits for tasks assessed  General Comments       Exercises Other Exercises Other Exercises: 2 loops amb around hallway, CGA, monitoring O2 and HR throughout   Shoulder Instructions      Home Living Family/patient expects to be discharged to:: Private residence Living Arrangements: Spouse/significant other Available Help at Discharge: Family;Available 24 hours/day Type of Home: House Home Access: Stairs to enter [employer] of Steps: 1+1 Entrance Stairs-Rails: None Home Layout: One level     Bathroom Shower/Tub: Producer, television/film/video: Handicapped height     Home Equipment: Cane - single point;Shower seat;Adaptive equipment;Rollator (4 wheels);Grab bars - tub/shower Adaptive Equipment: Reacher        Prior Functioning/Environment Prior Level of Function : Driving;Independent/Modified Independent             Mobility Comments: using cane or rolling walker intermittently as needed ADLs  Comments: Mod I, using shower chair PRN for bathing        OT Problem List: Decreased activity tolerance      OT Treatment/Interventions:      OT Goals(Current goals can be found in the care plan section) Acute Rehab OT Goals Patient Stated Goal: to return home OT Goal Formulation: With patient Time For Goal Achievement: 11/14/22 Potential to Achieve Goals: Good  OT Frequency:      Co-evaluation              AM-PAC OT "6 Clicks" Daily Activity     Outcome Measure Help from another person eating meals?: None Help from another person taking care of personal grooming?: None Help from another person toileting, which includes using toliet, bedpan, or urinal?: None Help from another person bathing (including washing, rinsing, drying)?: A Little Help from another person to put on and taking off regular upper body clothing?: None Help from another person to put on and taking off regular lower body clothing?: None 6 Click Score: 23   End of Session Equipment Utilized During Treatment: Rolling walker (2 wheels) Nurse Communication: Mobility status  Activity Tolerance: Patient tolerated treatment well Patient left: in chair;with call bell/phone within reach  OT Visit Diagnosis: Unsteadiness on feet (R26.81);Muscle weakness (generalized) (M62.81);Other abnormalities of gait and mobility (R26.89)                Time: 1610-9604 OT Time Calculation (min): 22 min Charges:  OT General Charges $OT Visit: 1 Visit OT Evaluation $OT Eval Low Complexity: 1 Low OT Treatments $Self Care/Home Management : 8-22 mins  [employer], OTS

## 2022-11-02 ENCOUNTER — Ambulatory Visit
Admission: RE | Admit: 2022-11-02 | Discharge: 2022-11-02 | Disposition: A | Discharge status: Home / Self Care | Payer: Medicare Other | Source: Ambulatory Visit | Attending: Physician Assistant

## 2022-11-02 ENCOUNTER — Other Ambulatory Visit: Payer: Self-pay | Admitting: Physician Assistant

## 2022-11-02 DIAGNOSIS — R1031 Right lower quadrant pain: Secondary | ICD-10-CM

## 2022-11-02 MED ORDER — IOHEXOL 300 MG/ML  SOLN
100.0000 mL | Freq: Once | INTRAMUSCULAR | Status: AC | PRN
  Administered 2022-11-02: 80 mL via INTRAVENOUS

## 2022-11-08 ENCOUNTER — Ambulatory Visit: Payer: Medicare Other | Admitting: Dermatology

## 2022-11-16 ENCOUNTER — Ambulatory Visit: Payer: Medicare Other | Admitting: Cardiovascular Disease

## 2022-11-20 ENCOUNTER — Other Ambulatory Visit: Payer: Self-pay | Admitting: Cardiovascular Disease

## 2022-11-28 ENCOUNTER — Ambulatory Visit (INDEPENDENT_AMBULATORY_CARE_PROVIDER_SITE_OTHER): Payer: Medicare Other

## 2022-11-28 DIAGNOSIS — R55 Syncope and collapse: Secondary | ICD-10-CM

## 2022-11-28 LAB — CUP PACEART REMOTE DEVICE CHECK
Date Time Interrogation Session: 20241016073407
Implantable Lead Connection Status: 753985
Implantable Lead Connection Status: 753985
Implantable Lead Implant Date: 20210120
Implantable Lead Implant Date: 20210120
Implantable Lead Location: 753859
Implantable Lead Location: 753860
Implantable Lead Model: 5076
Implantable Lead Model: 5076
Implantable Pulse Generator Implant Date: 20210120
Pulse Gen Model: 407145
Pulse Gen Serial Number: 69765687

## 2022-11-29 ENCOUNTER — Other Ambulatory Visit: Payer: Self-pay | Admitting: Dermatology

## 2022-12-07 ENCOUNTER — Ambulatory Visit: Payer: Medicare Other | Attending: Cardiovascular Disease | Admitting: Cardiovascular Disease

## 2022-12-07 ENCOUNTER — Encounter: Payer: Self-pay | Admitting: Cardiovascular Disease

## 2022-12-07 VITALS — BP 130/62 | HR 78 | Ht 62.0 in | Wt 203.0 lb

## 2022-12-07 DIAGNOSIS — I10 Essential (primary) hypertension: Secondary | ICD-10-CM

## 2022-12-07 DIAGNOSIS — Z950 Presence of cardiac pacemaker: Secondary | ICD-10-CM | POA: Diagnosis present

## 2022-12-07 DIAGNOSIS — J984 Other disorders of lung: Secondary | ICD-10-CM | POA: Diagnosis present

## 2022-12-07 DIAGNOSIS — I25118 Atherosclerotic heart disease of native coronary artery with other forms of angina pectoris: Secondary | ICD-10-CM

## 2022-12-07 DIAGNOSIS — I455 Other specified heart block: Secondary | ICD-10-CM | POA: Diagnosis present

## 2022-12-07 DIAGNOSIS — I4719 Other supraventricular tachycardia: Secondary | ICD-10-CM

## 2022-12-07 DIAGNOSIS — I4729 Other ventricular tachycardia: Secondary | ICD-10-CM

## 2022-12-07 DIAGNOSIS — Z789 Other specified health status: Secondary | ICD-10-CM

## 2022-12-07 DIAGNOSIS — R55 Syncope and collapse: Secondary | ICD-10-CM | POA: Diagnosis present

## 2022-12-07 DIAGNOSIS — I5032 Chronic diastolic (congestive) heart failure: Secondary | ICD-10-CM | POA: Diagnosis present

## 2022-12-07 DIAGNOSIS — E785 Hyperlipidemia, unspecified: Secondary | ICD-10-CM

## 2022-12-07 MED ORDER — TORSEMIDE 20 MG PO TABS: 40.0000 mg | ORAL_TABLET | Freq: Every day | ORAL | Status: DC

## 2022-12-07 MED ORDER — POTASSIUM CHLORIDE ER 10 MEQ PO TBCR: EXTENDED_RELEASE_TABLET | ORAL | Status: DC

## 2022-12-07 MED ORDER — NITROGLYCERIN 0.4 MG SL SUBL: 0.4000 mg | SUBLINGUAL_TABLET | SUBLINGUAL | 3 refills | Status: AC | PRN

## 2022-12-07 NOTE — Patient Instructions (Addendum)
Medication Instructions:   For weight <200, stay on torsemide 40 daily For weight >200 to 205, torsemide 40 in Am, extra torsemide 20 mg after lunch For weight >205, take torsemide 40 mg twice a day (couple days) For weight >208 that does not come down, take 1/2 metolazone  Take Potasium 30 Meq daily. Take an extra 10 Meq daily with extra 20 mg of Toresmide. Take an extra 74 Meq with extra Torsemide 40 MG.   If you need a refill on your cardiac medications before your next appointment, please call your pharmacy.   Lab work: No new labs needed  Testing/Procedures: No new testing needed  Follow-Up: At [facility], you and your health needs are our priority.  As part of our continuing mission to provide you with exceptional heart care, we have created designated Provider Care Teams.  These Care Teams include your primary Cardiologist (physician) and Advanced Practice Providers (APPs -  Physician Assistants and Nurse Practitioners) who all work together to provide you with the care you need, when you need it.  You will need a follow up appointment in 6 months, gollan or APP  Providers on your designated Care Team:   Nicolasa Ducking, NP Eula Listen, PA-C Cadence Fransico Michael, [place]  COVID-19 Vaccine Information can be found at: [email or web address] For questions related to vaccine distribution or appointments, please email [email or web address] or call [phone].

## 2022-12-07 NOTE — Progress Notes (Signed)
Date:  12/07/2022   ID:  [name], DOB Dec 16, 1945, MRN [id number]  Patient Location:  [address] [place] [address]   Provider location:   [facility], [place] office  PCP:  Danella Penton, MD  Cardiologist:  Hubbard Robinson [facility]   Chief Complaint  Patient presents with   3 month follow up     Patient c/o shortness of breath and angina with exertion. Patient was at [facility] in September with Covid, she had a lot of PVC's at that time. Medications reviewed by the patient verbally.      History of Present Illness:    [name] is a 77 y.o. female  past medical history of CAD,  cardiac catheterization in 2009 , June 2013, August 2019, November 2023 mild to moderate nonobstructive disease, moderate to severely elevated right heart filling pressures, medical management of D1 vessel disease Obesity,  diabetes,  hyperlipidemia with statin intolerance, on praluent obstructive sleep apnea on CPAP,   Tachycardia episodes,    leg edema,  lymphedema compression pumps seen in the hospital for severe hypertension and chest pain,  EF 60% in 07/2016 Loop monitor in place for syncope syncope with prolonged bradycardia more than 30 seconds.  There is at least 13 seconds of asystole with occasional scattered PVCs. Pacer, followed by Dr. Graciela Husbands 02/2019  recurrent syncope assoc with nausea and vomiting- Back surgeries 2017, 2021, both with postoperative infection Cardiac catheterization November 2023, medical recommended who presents for routine followup of her coronary artery disease and diastolic CHF  Last seen by myself in clinic July 2024 Seen by EP April 2024  Coronary CTA 07/2021 showed nonobstructive disease with normal FFR.  Cardiac catheterization November 2023, mild to moderate nonobstructive disease  Recent hospitalization for shortness of breath, weakness, COVID infection October 31, 2022  Diverticulitis after covid, treated with long  course of antibiotics, feels better now  Lost weight , lost 193 pounds On ozempic   Severe knee pain Would like knee replacement  Lab work reviewed Stable BMP A1C 6.4 Echo 11/23: EF 55 to 60%  Overall she feels she is back to her baseline Ability to ambulate limited secondary to chronic knee pain Remains on torsemide 40 daily  EKG personally reviewed by myself on todays visit EKG Interpretation Date/Time:  Friday December 07 2022 10:59:47 EDT Ventricular Rate:  78 PR Interval:  164 QRS Duration:  76 QT Interval:  398 QTC Calculation: 453 R Axis:   9  Text Interpretation: Atrial-paced rhythm When compared with ECG of 30-Oct-2022 08:32, No significant change was found Confirmed by Julien Nordmann [id number]) on 12/07/2022 11:06:42 AM   Other past medical history reviewed  Admitted to [facility] 12/2021 worsening shortness of breath, chest and jaw tightness And cardiac catheterization showing stable coronary disease small vessel not amenable to PCI Briefly 2 severely elevated right heart filling pressures  Weight down to 212 today At home 209 Feels her goal weight should be less than 205  Episode last week, after having her evening medications, developed dizziness  while sitting down Could not stand without help, legs gave out Felt out of it, Made it to bed 3 hours later By morning felt better Wonders if it was a TIA, did not want to go to the emergency room Reports feeling fine since that time Denies any neurologic deficits Did not check her vitals when she had symptoms Sedentary, difficulty moving around  Past medical history reviewed Coronary  CTA 07/2021 showed nonobstructive disease with normal FFR Comparison made to cardiac catheterization 2019, no dramatic changes Still with moderate disease in the LAD  Echo 7/22   1. Left ventricular ejection fraction, by estimation, is 60 to 65%. The  left ventricle has normal function. The left ventricle has no regional  wall  motion abnormalities. Left ventricular diastolic parameters are  consistent with Grade II diastolic  dysfunction (pseudonormalization).   2. Right ventricular systolic function is normal. The right ventricular  size is normal. Tricuspid regurgitation signal is inadequate for assessing  PA pressure.   Total chol 120, LDL 48, on Praluent A1C 6.6 Non smoker  Seen in the emergency room with admission July 2022 for angina Stress test performed showing low risk study, Echocardiogram with no acute findings,  It was felt symptoms from GI versus musculoskeletal etiology, unable to exclude stressors  Pacer downloads reviewed,  stable  Other past medical hx reviewed 05/16/2018 left arm pain, etiology unclear Went to the ER, Work up negative  Cath: 10/01/2017 Relatively small LAD with moderate mid vessel disease. Small D1 with 80% ostial/proximal stenosis. Large, dominant LCx without significant disease. Small, non-dominant RCA. Moderately elevated LVEDP. RECOMMENDATIONS: Medical therapy for LAD/diagonal disease.   syncope atrial tachycardia with documented  Pauses.  It was presumed that both events occurred while she was sleeping. --loop monitor placed   Hospitalization for chest pain July 17, 2017 Stress test showing no ischemia, ejection fraction 85% Significant failure to thrive/weakness  Had back surgery 11/14/2015 Went to rehab Got postop infection, ecoli, sepsis  cardiac catheterization 07/25/2011 for chest pain   This showed left dominant coronary system with moderate mid LAD, proximal diagonal #1 and proximal RCA disease all estimated at 50%, normal LV systolic function.   Past Medical History:  Diagnosis Date   Acquired elevated diaphragm    HIGHER ON RIGHT SIDE   Anemia    Anxiety    Aortic atherosclerosis (HCC)    Arthritis    CAD (coronary artery disease)    a. 07/2017 MV: Low risk; b. 09/2017 Cath: LM nl, LAD 50p/m, D1 80ost, LCX large/nl, RCA small/nl.   Chronic  back pain    stenosis.degenerative disc,some scoliosis   Chronic heart failure with preserved ejection fraction (HFpEF) (HCC)    a. 07/2017 Echo: EF 60-65%, no rwma, gr2 DD, nl RV fxn.   Chronic kidney disease    stage 3   Complication of anesthesia 2018   aspirated during wrist surgery during LMA removal   Constipation    takes Stool Softener daily   Coronary artery disease    Depression    takes Cymbalta daily   Diabetes mellitus    Type 2 diabetic. Average fasting blood sugar runs high 170-200   Diastolic CHF (HCC) 07/17/2017   Per patient, diagnosed in 2018   Diverticulosis    Dysplastic nevus 09/12/2017   L sup buttock - mild    Dysplastic nevus 09/18/2018   R sup med buttocks - mild    E coli infection    GERD (gastroesophageal reflux disease)    takes Nexium daily   Grade II diastolic dysfunction    Headache    Hemorrhoids    History of colon polyps    benign   History of gout    doesn't take any meds   History of hiatal hernia    small   History of vertigo    doesn't take any meds   Hyperlipidemia    takes Praluent  daily   Hypertension    currently BP medications are on hold    Hypothyroidism    takes Synthroid daily   Insomnia    takes Restoril nightly   Muscle spasm    takes Robaxin as needed   NSVT (nonsustained ventricular tachycardia) (HCC)    OSA on CPAP    Paroxysmal atrial tachycardia (HCC)    Peripheral vascular disease (HCC)    AAA as stated per pt / was just discovered and pt states has not been referred to vascular MD    Presence of permanent cardiac pacemaker    Restless leg    takes Requip daily   Rosacea    Symptomatic bradycardia    a. 02/2019 s/p Biotronik Edora 8 DR-T DC PPM   Syncope    a. Documented bradycardia and pauses-->02/2019 s/p Biotronik Edora 8 DR-T DC PPM.   Varicose veins    Past Surgical History:  Procedure Laterality Date   ABDOMINAL HYSTERECTOMY     with BSo   BACK SURGERY     CARDIAC CATHETERIZATION  2013    Normal   CARPAL TUNNEL RELEASE Bilateral    CATARACT EXTRACTION, BILATERAL     CHOLECYSTECTOMY     COLONOSCOPY     COLONOSCOPY WITH PROPOFOL N/A 11/25/2018   Procedure: COLONOSCOPY WITH PROPOFOL;  Surgeon: Toledo, Boykin Nearing, MD;  Location: [facility] ENDOSCOPY;  Service: Gastroenterology;  Laterality: N/A;   DIAGNOSTIC LAPAROSCOPY     multiple times   DILATION AND CURETTAGE OF UTERUS     ESOPHAGOGASTRODUODENOSCOPY (EGD) WITH PROPOFOL N/A 11/01/2014   Procedure: ESOPHAGOGASTRODUODENOSCOPY (EGD) WITH PROPOFOL;  Surgeon: Wallace Cullens, MD;  Location: [facility] ENDOSCOPY;  Service: Gastroenterology;  Laterality: N/A;   ESOPHAGOGASTRODUODENOSCOPY (EGD) WITH PROPOFOL N/A 11/25/2018   Procedure: ESOPHAGOGASTRODUODENOSCOPY (EGD) WITH PROPOFOL;  Surgeon: Toledo, Boykin Nearing, MD;  Location: [facility] ENDOSCOPY;  Service: Gastroenterology;  Laterality: N/A;   ESOPHAGOGASTRODUODENOSCOPY (EGD) WITH PROPOFOL N/A 07/11/2022   Procedure: ESOPHAGOGASTRODUODENOSCOPY (EGD) WITH PROPOFOL;  Surgeon: Toledo, Boykin Nearing, MD;  Location: [facility] ENDOSCOPY;  Service: Gastroenterology;  Laterality: N/A;  IDDM   HARDWARE REMOVAL Left 10/11/2016   Procedure: HARDWARE REMOVAL-LEFT RADIUS;  Surgeon: Lyndle Herrlich, MD;  Location: [facility] ORS;  Service: Orthopedics;  Laterality: Left;  Left Radius Wrist    IMPLANTABLE CONTACT LENS IMPLANTATION     bilateral   KNEE ARTHROSCOPY Right 05/09/2020   Procedure: ARTHROSCOPY KNEE;  Surgeon: Donato Heinz, MD;  Location: [facility] ORS;  Service: Orthopedics;  Laterality: Right;   LAMINECTOMY  11/13/2015   LEFT HEART CATH AND CORONARY ANGIOGRAPHY Left 10/01/2017   Procedure: LEFT HEART CATH AND CORONARY ANGIOGRAPHY;  Surgeon: Yvonne Kendall, MD;  Location: [facility] INVASIVE CV LAB;  Service: Cardiovascular;  Laterality: Left;   LOOP RECORDER INSERTION N/A 10/17/2017   Procedure: LOOP RECORDER INSERTION;  Surgeon: Duke Salvia, MD;  Location: [facility] INVASIVE CV LAB;  Service: Cardiovascular;  Laterality: N/A;    LOOP RECORDER REMOVAL N/A 03/04/2019   Procedure: LOOP RECORDER REMOVAL;  Surgeon: Duke Salvia, MD;  Location: [facility] INVASIVE CV LAB;  Service: Cardiovascular;  Laterality: N/A;   LUMBAR FUSION  11/2015   LUMBAR WOUND DEBRIDEMENT N/A 12/02/2015   Procedure: WOUND Exploration;  Surgeon: Lisbeth Renshaw, MD;  Location: [facility] OR;  Service: Neurosurgery;  Laterality: N/A;   OPEN REDUCTION INTERNAL FIXATION (ORIF) DISTAL RADIAL FRACTURE Left 08/29/2016   Procedure: OPEN REDUCTION INTERNAL FIXATION (ORIF) DISTAL RADIAL FRACTURE;  Surgeon: Lyndle Herrlich, MD;  Location: [facility] ORS;  Service: Orthopedics;  Laterality: Left;   PACEMAKER IMPLANT N/A 03/04/2019   Procedure: PACEMAKER IMPLANT;  Surgeon: Duke Salvia, MD;  Location: [facility] INVASIVE CV LAB;  Service: Cardiovascular;  Laterality: N/A;   PICC LINE PLACE PERIPHERAL ([facility] HX)     right upper arm    RIGHT/LEFT HEART CATH AND CORONARY ANGIOGRAPHY N/A 12/26/2021   Procedure: RIGHT/LEFT HEART CATH AND CORONARY ANGIOGRAPHY;  Surgeon: Yvonne Kendall, MD;  Location: [facility] INVASIVE CV LAB;  Service: Cardiovascular;  Laterality: N/A;   ROTATOR CUFF REPAIR Right    SAVORY DILATION N/A 11/01/2014   Procedure: SAVORY DILATION;  Surgeon: Wallace Cullens, MD;  Location: [facility] ENDOSCOPY;  Service: Gastroenterology;  Laterality: N/A;   TONSILLECTOMY     TRIGGER FINGER RELEASE Bilateral      Current Meds  Medication Sig   Acetaminophen (TYLENOL ARTHRITIS PAIN PO) Take 1,300 mg by mouth every 8 (eight) hours as needed (pain).   albuterol (PROVENTIL) (2.5 MG/3ML) 0.083% nebulizer solution Take 3 mLs (2.5 mg total) by nebulization every 4 (four) hours as needed for wheezing or shortness of breath.   albuterol (VENTOLIN HFA) 108 (90 Base) MCG/ACT inhaler Inhale 1 puff into the lungs every 4 (four) hours as needed.   aspirin 81 MG tablet Take 81 mg by mouth at bedtime.    atenolol (TENORMIN) 25 MG tablet Take 12.5 mg by mouth 2 (two) times daily.   cholecalciferol  (VITAMIN D3) 25 MCG (1000 UNIT) tablet Take 1,000 Units by mouth daily.   Crisaborole (EUCRISA) 2 % OINT Apply 1 Application topically as directed. qd to bid to aa eczema on back as needed for flares   diclofenac Sodium (VOLTAREN) 1 % GEL Apply 2 g topically 2 (two) times daily.   doxycycline (PERIOSTAT) 20 MG tablet TAKE 1 TABLET BY MOUTH TWICE A DAY   empagliflozin (JARDIANCE) 10 MG TABS tablet Take 10 mg by mouth daily.   esomeprazole (NEXIUM) 40 MG capsule Take 40 mg by mouth 2 (two) times daily.   gabapentin (NEURONTIN) 300 MG capsule Take 300 mg by mouth 2 (two) times daily.   Insulin Aspart (NOVOLOG FLEXPEN [place]) Inject 10 Units into the skin as needed (diabetes).   LANTUS SOLOSTAR 100 UNIT/ML Solostar Pen Inject 22 Units into the skin every morning.   levothyroxine (SYNTHROID, LEVOTHROID) 75 MCG tablet Take 75 mcg by mouth daily before breakfast.    methocarbamol (ROBAXIN) 500 MG tablet Take 500 mg by mouth every 8 (eight) hours as needed for muscle spasms.   metolazone (ZAROXOLYN) 2.5 MG tablet Take 2.5 mg by mouth daily as needed (fluid overload). Weight over 215 lb   mometasone (ELOCON) 0.1 % cream Apply 1 application topically daily as needed (Rash). Qd to bid up to 5 days a week aa right lower leg prn itching   ondansetron (ZOFRAN) 4 MG tablet Take 4 mg by mouth every 8 (eight) hours as needed for nausea or vomiting.    potassium chloride (KLOR-CON) 10 MEQ tablet Take 2 tablet (20 meq) by mouth twice daily as directed. Take an additional 2 tablets ( 20 meg) with extra dose or Torsemide. Take 2 tablets (20 meq) when taking Metolazone.   PRALUENT 150 MG/ML SOAJ INJECT 150 MG UNDER THE SKIN EVERY 14 DAYS   ranolazine (RANEXA) 500 MG 12 hr tablet Take 500 mg by mouth 2 (two) times daily.   ropinirole (REQUIP) 5 MG tablet Take 5 mg by mouth every evening.   Semaglutide,0.25 or 0.5MG /DOS, (OZEMPIC, 0.25 OR 0.5 MG/DOSE,) 2 MG/1.5ML  SOPN Inject 0.5 mg into the skin once a week. On Fridays    temazepam (RESTORIL) 30 MG capsule Take 30 mg by mouth at bedtime.   torsemide (DEMADEX) 20 MG tablet TAKE 2 TABLETS BY MOUTH TWICE A DAY AS DIRECTED   traMADol (ULTRAM) 50 MG tablet Take 50 mg by mouth every 6 (six) hours as needed for moderate pain or severe pain.   trolamine salicylate (ASPERCREME) 10 % cream Apply 1 application topically at bedtime.   [DISCONTINUED] nitroGLYCERIN (NITROSTAT) 0.4 MG SL tablet Place 1 tablet (0.4 mg total) under the tongue every 5 (five) minutes as needed for chest pain.     Allergies:   Ezetimibe, Hydrocodone-acetaminophen, Metoclopramide, Propoxyphene, Cefuroxime, Hydrocodone, Tramadol, Ceftin [cefuroxime axetil], Codeine, Penicillins, Statins, and Vicodin [hydrocodone-acetaminophen]   Social History   Tobacco Use   Smoking status: Never   Smokeless tobacco: Never  Vaping Use   Vaping status: Never Used  Substance Use Topics   Alcohol use: No   Drug use: No     Family Hx: The patient's family history includes Breast cancer (age of onset: 7) in her mother; Heart attack in her sister; Heart disease in her brother, father, mother, and sister.  ROS:   Please see the history of present illness.    Review of Systems  Constitutional: Negative.   HENT: Negative.    Respiratory:  Positive for shortness of breath.   Cardiovascular: Negative.   Gastrointestinal: Negative.   Musculoskeletal: Negative.   Neurological: Negative.   Psychiatric/Behavioral: Negative.    All other systems reviewed and are negative.    Labs/Other Tests and Data Reviewed:    Recent Labs: 12/24/2021: B Natriuretic Peptide 21.3 10/29/2022: ALT 13 10/30/2022: Magnesium 2.4 10/31/2022: BUN 13; Creatinine, Ser 1.22; Hemoglobin 10.5; Platelets 218; Potassium 4.3; Sodium 140   Recent Lipid Panel Lab Results  Component Value Date/Time   CHOL 163 09/08/2020 06:13 AM   CHOL 163 07/26/2015 08:02 AM   TRIG 192 (H) 09/08/2020 06:13 AM   HDL 44 09/08/2020 06:13 AM   HDL 32 (L)  07/26/2015 08:02 AM   CHOLHDL 3.7 09/08/2020 06:13 AM   LDLCALC 81 09/08/2020 06:13 AM   LDLCALC 94 07/26/2015 08:02 AM    Wt Readings from Last 3 Encounters:  12/07/22 203 lb (92.1 kg)  10/29/22 195 lb (88.5 kg)  08/14/22 209 lb 8 oz (95 kg)     Exam:    Vital Signs: Vital signs may also be detailed in the HPI BP 130/62 (BP Location: Left Arm, Patient Position: Sitting, Cuff Size: Normal)   Pulse 78   Ht 5\' 2"  (1.575 m)   Wt 203 lb (92.1 kg)   SpO2 97%   BMI 37.13 kg/m   Constitutional:  oriented to person, place, and time. No distress.  HENT:  Head: Grossly normal Eyes:  no discharge. No scleral icterus.  Neck: No JVD, no carotid bruits  Cardiovascular: Regular rate and rhythm, no murmurs appreciated Pulmonary/Chest: Clear to auscultation bilaterally, no wheezes or rails Abdominal: Soft.  no distension.  no tenderness.  Musculoskeletal: Normal range of motion Neurological:  normal muscle tone. Coordination normal. No atrophy Skin: Skin warm and dry Psychiatric: normal affect, pleasant   ASSESSMENT & PLAN:    Preop cardiovascular evaluation Acceptable risk for total knee replacement with Dr. Ernest Pine No further cardiac testing needed  Atherosclerosis of native coronary artery with stable angina pectoris, unspecified whether native or transplanted heart [facility]) Currently with no symptoms of angina. No further workup at  this time. Continue current medication regimen. Rare chest pain, takes nitro as needed Further ischemic workup not needed at this time, symptoms stable Cardiac catheterization November 2023, medical management recommended Diabetes relatively well-controlled, cholesterol at goal  Type 2 diabetes mellitus with stage 3 chronic kidney disease, with long-term current use of insulin (HCC) Exercise Limited by back pain A1c 6.2, in general is relatively well  Atrial tachycardia (HCC) Followed by Dr. Dellie Burns downloads reviewed Nonsustained VT summer 2023  low arrhythmia  Chronic diastolic heart failure (HCC) Recommend IV diuretic regimen as below: For weight <200, stay on torsemide 40 daily, potassium 30 For weight >200 to 205, torsemide 40 in Am, extra torsemide 20 mg after lunch, potassium 40 For weight >205, take torsemide 40 mg twice a day (couple days), potassium 50 For weight >208 that does not come down, take 1/2 metolazone, potassium 60  Sinus node dysfunction (HCC) Followed by Dr. Graciela Husbands Has pacer months reviewed  Lymphedema Stable sx, minimal Previously required compression pumps, symptoms improving Recommend compression hose  Essential hypertension Blood pressure is well controlled on today's visit. No changes made to the medications.  OSA on CPAP Continues to use her CPAP  Syncope Pacemaker in place No near-syncope or syncope  CRI Discussed, avoid NSAIDs  Stable renal function  Hyperlipidemia Continue Praluent, Ozempic for weight loss Statin intolerance  Signed, Julien Nordmann, MD  12/07/2022 11:24 AM    [facility] [address], [place], [place] [address]

## 2022-12-14 ENCOUNTER — Other Ambulatory Visit: Payer: Self-pay | Admitting: Cardiovascular Disease

## 2022-12-14 DIAGNOSIS — I25110 Atherosclerotic heart disease of native coronary artery with unstable angina pectoris: Secondary | ICD-10-CM

## 2022-12-14 DIAGNOSIS — E785 Hyperlipidemia, unspecified: Secondary | ICD-10-CM

## 2022-12-14 DIAGNOSIS — I2510 Atherosclerotic heart disease of native coronary artery without angina pectoris: Secondary | ICD-10-CM

## 2022-12-14 DIAGNOSIS — E781 Pure hyperglyceridemia: Secondary | ICD-10-CM

## 2022-12-18 NOTE — Progress Notes (Signed)
Remote pacemaker transmission.   

## 2022-12-25 ENCOUNTER — Other Ambulatory Visit: Payer: Self-pay

## 2022-12-25 MED ORDER — DOXYCYCLINE HYCLATE 20 MG PO TABS: 20.0000 mg | ORAL_TABLET | Freq: Two times a day (BID) | ORAL | 1 refills | Status: DC

## 2022-12-25 NOTE — Progress Notes (Signed)
Pharmacy requesting 90 day supply

## 2023-01-03 ENCOUNTER — Encounter: Payer: Self-pay | Admitting: Internal Medicine

## 2023-01-03 ENCOUNTER — Encounter
Admission: RE | Admit: 2023-01-03 | Discharge: 2023-01-03 | Disposition: A | Discharge status: Home / Self Care | Payer: Medicare Other | Source: Ambulatory Visit | Attending: Orthopedic Surgery | Admitting: Orthopedic Surgery

## 2023-01-03 ENCOUNTER — Other Ambulatory Visit: Payer: Self-pay

## 2023-01-03 VITALS — BP 123/56 | HR 72 | Resp 16 | Ht 62.0 in | Wt 197.0 lb

## 2023-01-03 DIAGNOSIS — E039 Hypothyroidism, unspecified: Secondary | ICD-10-CM

## 2023-01-03 DIAGNOSIS — R600 Localized edema: Secondary | ICD-10-CM

## 2023-01-03 DIAGNOSIS — N1832 Chronic kidney disease, stage 3b: Secondary | ICD-10-CM

## 2023-01-03 DIAGNOSIS — E538 Deficiency of other specified B group vitamins: Secondary | ICD-10-CM | POA: Diagnosis not present

## 2023-01-03 DIAGNOSIS — M1990 Unspecified osteoarthritis, unspecified site: Secondary | ICD-10-CM | POA: Diagnosis not present

## 2023-01-03 DIAGNOSIS — Z01818 Encounter for other preprocedural examination: Secondary | ICD-10-CM | POA: Diagnosis present

## 2023-01-03 DIAGNOSIS — R9431 Abnormal electrocardiogram [ECG] [EKG]: Secondary | ICD-10-CM | POA: Diagnosis not present

## 2023-01-03 DIAGNOSIS — Z794 Long term (current) use of insulin: Secondary | ICD-10-CM | POA: Diagnosis not present

## 2023-01-03 DIAGNOSIS — E1122 Type 2 diabetes mellitus with diabetic chronic kidney disease: Secondary | ICD-10-CM

## 2023-01-03 DIAGNOSIS — R531 Weakness: Secondary | ICD-10-CM

## 2023-01-03 DIAGNOSIS — E1165 Type 2 diabetes mellitus with hyperglycemia: Secondary | ICD-10-CM | POA: Diagnosis not present

## 2023-01-03 DIAGNOSIS — R700 Elevated erythrocyte sedimentation rate: Secondary | ICD-10-CM

## 2023-01-03 DIAGNOSIS — M2550 Pain in unspecified joint: Secondary | ICD-10-CM | POA: Diagnosis not present

## 2023-01-03 DIAGNOSIS — I890 Lymphedema, not elsewhere classified: Secondary | ICD-10-CM

## 2023-01-03 DIAGNOSIS — R5381 Other malaise: Secondary | ICD-10-CM | POA: Diagnosis not present

## 2023-01-03 DIAGNOSIS — M1711 Unilateral primary osteoarthritis, right knee: Secondary | ICD-10-CM

## 2023-01-03 DIAGNOSIS — Z880 Allergy status to penicillin: Secondary | ICD-10-CM | POA: Diagnosis not present

## 2023-01-03 DIAGNOSIS — Z01810 Encounter for preprocedural cardiovascular examination: Secondary | ICD-10-CM | POA: Diagnosis not present

## 2023-01-03 DIAGNOSIS — R8281 Pyuria: Secondary | ICD-10-CM

## 2023-01-03 LAB — CBC WITH DIFFERENTIAL/PLATELET
Abs Immature Granulocytes: 0.03 10*3/uL (ref 0.00–0.07)
Basophils Absolute: 0 10*3/uL (ref 0.0–0.1)
Basophils Relative: 0 %
Eosinophils Absolute: 0.1 10*3/uL (ref 0.0–0.5)
Eosinophils Relative: 1 %
HCT: 36.3 % (ref 36.0–46.0)
Hemoglobin: 11.4 g/dL — ABNORMAL LOW (ref 12.0–15.0)
Immature Granulocytes: 0 %
Lymphocytes Relative: 20 %
Lymphs Abs: 2.1 10*3/uL (ref 0.7–4.0)
MCH: 25.2 pg — ABNORMAL LOW (ref 26.0–34.0)
MCHC: 31.4 g/dL (ref 30.0–36.0)
MCV: 80.1 fL (ref 80.0–100.0)
Monocytes Absolute: 0.5 10*3/uL (ref 0.1–1.0)
Monocytes Relative: 5 %
Neutro Abs: 7.8 10*3/uL — ABNORMAL HIGH (ref 1.7–7.7)
Neutrophils Relative %: 74 %
Platelets: 209 10*3/uL (ref 150–400)
RBC: 4.53 MIL/uL (ref 3.87–5.11)
RDW: 17.3 % — ABNORMAL HIGH (ref 11.5–15.5)
WBC: 10.5 10*3/uL (ref 4.0–10.5)
nRBC: 0 % (ref 0.0–0.2)

## 2023-01-03 LAB — COMPREHENSIVE METABOLIC PANEL
ALT: 13 U/L (ref 0–44)
AST: 15 U/L (ref 15–41)
Albumin: 4 g/dL (ref 3.5–5.0)
Alkaline Phosphatase: 102 U/L (ref 38–126)
Anion gap: 12 (ref 5–15)
BUN: 25 mg/dL — ABNORMAL HIGH (ref 8–23)
CO2: 28 mmol/L (ref 22–32)
Calcium: 9.2 mg/dL (ref 8.9–10.3)
Chloride: 99 mmol/L (ref 98–111)
Creatinine, Ser: 1.25 mg/dL — ABNORMAL HIGH (ref 0.44–1.00)
GFR, Estimated: 44 mL/min — ABNORMAL LOW (ref >60)
Glucose, Bld: 101 mg/dL — ABNORMAL HIGH (ref 70–99)
Potassium: 3.6 mmol/L (ref 3.5–5.1)
Sodium: 139 mmol/L (ref 135–145)
Total Bilirubin: 0.8 mg/dL (ref <1.2)
Total Protein: 7.4 g/dL (ref 6.5–8.1)

## 2023-01-03 LAB — URINALYSIS, ROUTINE W REFLEX MICROSCOPIC
Bilirubin Urine: NEGATIVE
Glucose, UA: >=500 mg/dL — AB
Hgb urine dipstick: NEGATIVE
Ketones, ur: NEGATIVE mg/dL
Leukocytes,Ua: NEGATIVE
Nitrite: NEGATIVE
Protein, ur: NEGATIVE mg/dL
RBC / HPF: 0 RBC/hpf (ref 0–5)
Specific Gravity, Urine: 1.018 (ref 1.005–1.030)
pH: 5 (ref 5.0–8.0)

## 2023-01-03 LAB — SURGICAL PCR SCREEN
MRSA, PCR: NEGATIVE
Staphylococcus aureus: NEGATIVE

## 2023-01-03 LAB — HEMOGLOBIN A1C
Hgb A1c MFr Bld: 6.2 % — ABNORMAL HIGH (ref 4.8–5.6)
Mean Plasma Glucose: 131.24 mg/dL

## 2023-01-03 LAB — SEDIMENTATION RATE: Sed Rate: 43 mm/h — ABNORMAL HIGH (ref 0–30)

## 2023-01-03 LAB — C-REACTIVE PROTEIN: CRP: 0.9 mg/dL (ref <1.0)

## 2023-01-03 NOTE — Discharge Instructions (Signed)

## 2023-01-03 NOTE — Progress Notes (Signed)
PERIOPERATIVE PRESCRIPTION FOR IMPLANTED CARDIAC DEVICE PROGRAMMING  Patient Information: Name:  [name]  DOB:  16-Jan-1946  MRN:  [id number]    Planned Procedure: COMPUTER ASSISTED TOTAL KNEE ARTHROPLASTY (Right: Knee)   Surgeon:  Dr. Francesco Sor, MD  Requesting device clearance: Quentin Mulling, FNP-C  Date of Procedure:  01/14/2023  Cautery will be used.   Device Information:  Clinic EP Physician:  Sherryl Manges, MD   Device Type:  Pacemaker Manufacturer and Phone #:  Biotronik: [phone] Pacemaker Dependent?:  No. Date of Last Device Check:  in office 06/05/2022, remote check: 11/28/22 Normal Device Function?:  Yes.    Electrophysiologist's Recommendations:  Have magnet available. Provide continuous ECG monitoring when magnet is used or reprogramming is to be performed.  Procedure should not interfere with device function.  No device programming or magnet placement needed.  Per Device Clinic Standing Orders, Kizzie Ide, RN  8:13 PM 01/03/2023

## 2023-01-03 NOTE — Patient Instructions (Addendum)
Your procedure is scheduled on: Monday 01/14/23 To find out your arrival time, please call [phone] between 1PM - 3PM on: Friday 01/11/23   Report to the Registration Desk on the 1st floor of [facility]. Free Valet parking is available.  If your arrival time is 6:00 am, do not arrive before that time as the [facility] entrance doors do not open until 6:00 am.  REMEMBER: Instructions that are not followed completely may result in serious medical risk, up to and including death; or upon the discretion of your surgeon and anesthesiologist your surgery may need to be rescheduled.  Do not eat food after midnight the night before surgery.  No gum chewing or hard candies.  You may however, drink CLEAR liquids up to 2 hours before you are scheduled to arrive for your surgery. Do not drink anything within 2 hours of your scheduled arrival time.  Clear liquids include: - water   Type 1 and Type 2 diabetics should only drink water.  In addition, your doctor has ordered for you to drink the provided:  Gatorade G2 Drinking this carbohydrate drink up to two hours before surgery helps to reduce insulin resistance and improve patient outcomes. Please complete drinking 2 hours before scheduled arrival time.  One week prior to surgery: Stop Anti-inflammatories (NSAIDS) such as Advil, Aleve, Ibuprofen, Motrin, Naproxen, Naprosyn and Aspirin based products such as Excedrin, Goody's Powder, BC Powder. You may however, continue to take Tylenol if needed for pain up until the day of surgery.  Stop ANY OVER THE COUNTER supplements and vitamins for 7 days until after surgery.  Continue taking all prescribed medications with the exception of the following: Semaglutide/OZEMPIC hold for 7 days, last dose will be Sunday 01/06/23. empagliflozin (JARDIANCE) hold for 3 days, last dose will be Thursday 01/10/23.   TAKE ONLY THESE MEDICATIONS THE MORNING OF SURGERY WITH A SIP OF WATER:  atenolol  (TENORMIN) 12.5 MG tablet  esomeprazole (NEXIUM) 40 MG capsule  gabapentin (NEURONTIN) 300 MG capsule  levothyroxine (SYNTHROID, LEVOTHROID) 75 MCG tablet  ranolazine (RANEXA) 500 MG 12 hr tablet   Use inhalers or nebulizer prior to arrival on the day of surgery and bring inhaler to the hospital   No Alcohol for 24 hours before or after surgery.  No Smoking including e-cigarettes for 24 hours before surgery.  No chewable tobacco products for at least 6 hours before surgery.  No nicotine patches on the day of surgery.  Do not use any "recreational" drugs for at least a week (preferably 2 weeks) before your surgery.  Please be advised that the combination of cocaine and anesthesia may have negative outcomes, up to and including death. If you test positive for cocaine, your surgery will be cancelled.  On the morning of surgery brush your teeth with toothpaste and water, you may rinse your mouth with mouthwash if you wish. Do not swallow any toothpaste or mouthwash.  Use CHG Soap or wipes as directed on instruction sheet. Shower daily with CHG soap for 5 consecutive days starting Thursday 01/10/23  Do not wear lotions, powders, or perfumes on the day of surgery  Do not shave body hair from the neck down 48 hours before surgery.  Wear clean comfortable clothing (specific to your surgery type) to the hospital.  Do not wear jewelry, make-up, hairpins, clips or nail polish.  For welded (permanent) jewelry: bracelets, anklets, waist bands, etc.  Please have this removed prior to surgery.  If it is not removed,  there is a chance that hospital personnel will need to cut it off on the day of surgery. Contact lenses, hearing aids and dentures may not be worn into surgery.  Bring your C-PAP to the hospital in case you may have to spend the night.   Do not bring valuables to the hospital. [facility] is not responsible for any missing/lost belongings or valuables.   Notify your doctor if there  is any change in your medical condition (cold, fever, infection).  If you are being discharged the day of surgery, you will not be allowed to drive home. You will need a responsible individual to drive you home and stay with you for 24 hours after surgery.   If you are taking public transportation, you will need to have a responsible individual with you.  If you are being admitted to the hospital overnight, leave your suitcase in the car. After surgery it may be brought to your room.  In case of increased patient census, it may be necessary for you, the patient, to continue your postoperative care in the Same Day Surgery department.  After surgery, you can help prevent lung complications by doing breathing exercises.  Take deep breaths and cough every 1-2 hours. Your doctor may order a device called an Incentive Spirometer to help you take deep breaths. When coughing or sneezing, hold a pillow firmly against your incision with both hands. This is called "splinting." Doing this helps protect your incision. It also decreases belly discomfort.  Surgery Visitation Policy:  Patients undergoing a surgery or procedure may have two family members or support persons with them as long as the person is not COVID-19 positive or experiencing its symptoms.   Inpatient Visitation:    Visiting hours are 7 a.m. to 8 p.m. Up to four visitors are allowed at one time in a patient room. The visitors may rotate out with other people during the day. One designated support person (adult) may remain overnight.  Please call the Pre-admissions Testing Dept. at [phone] if you have any questions about these instructions.    Pre-operative 5 CHG Bath Instructions   You can play a key role in reducing the risk of infection after surgery. Your skin needs to be as free of germs as possible. You can reduce the number of germs on your skin by washing with CHG (chlorhexidine gluconate) soap before surgery. CHG is an  antiseptic soap that kills germs and continues to kill germs even after washing.   DO NOT use if you have an allergy to chlorhexidine/CHG or antibacterial soaps. If your skin becomes reddened or irritated, stop using the CHG and notify one of our RNs at [phone].   Please shower with the CHG soap starting 4 days before surgery using the following schedule:     Please keep in mind the following:  DO NOT shave, including legs and underarms, starting the day of your first shower.   You may shave your face at any point before/day of surgery.  Place clean sheets on your bed the day you start using CHG soap. Use a clean washcloth (not used since being washed) for each shower. DO NOT sleep with pets once you start using the CHG.   CHG Shower Instructions:  If you choose to wash your hair and private area, wash first with your normal shampoo/soap.  After you use shampoo/soap, rinse your hair and body thoroughly to remove shampoo/soap residue.  Turn the water OFF and apply about 3 tablespoons (  45 ml) of CHG soap to a CLEAN washcloth.  Apply CHG soap ONLY FROM YOUR NECK DOWN TO YOUR TOES (washing for 3-5 minutes)  DO NOT use CHG soap on face, private areas, open wounds, or sores.  Pay special attention to the area where your surgery is being performed.  If you are having back surgery, having someone wash your back for you may be helpful. Wait 2 minutes after CHG soap is applied, then you may rinse off the CHG soap.  Pat dry with a clean towel  Put on clean clothes/pajamas   If you choose to wear lotion, please use ONLY the CHG-compatible lotions on the back of this paper.     Additional instructions for the day of surgery: DO NOT APPLY any lotions, deodorants, cologne, or perfumes.   Put on clean/comfortable clothes.  Brush your teeth.  Ask your nurse before applying any prescription medications to the skin.      CHG Compatible Lotions   Aveeno Moisturizing lotion  Cetaphil  Moisturizing Cream  Cetaphil Moisturizing Lotion  Clairol Herbal Essence Moisturizing Lotion, Dry Skin  Clairol Herbal Essence Moisturizing Lotion, Extra Dry Skin  Clairol Herbal Essence Moisturizing Lotion, Normal Skin  Curel Age Defying Therapeutic Moisturizing Lotion with Alpha Hydroxy  Curel Extreme Care Body Lotion  Curel Soothing Hands Moisturizing Hand Lotion  Curel Therapeutic Moisturizing Cream, Fragrance-Free  Curel Therapeutic Moisturizing Lotion, Fragrance-Free  Curel Therapeutic Moisturizing Lotion, Original Formula  Eucerin Daily Replenishing Lotion  Eucerin Dry Skin Therapy Plus Alpha Hydroxy Crme  Eucerin Dry Skin Therapy Plus Alpha Hydroxy Lotion  Eucerin Original Crme  Eucerin Original Lotion  Eucerin Plus Crme Eucerin Plus Lotion  Eucerin TriLipid Replenishing Lotion  Keri Anti-Bacterial Hand Lotion  Keri Deep Conditioning Original Lotion Dry Skin Formula Softly Scented  Keri Deep Conditioning Original Lotion, Fragrance Free Sensitive Skin Formula  Keri Lotion Fast Absorbing Fragrance Free Sensitive Skin Formula  Keri Lotion Fast Absorbing Softly Scented Dry Skin Formula  Keri Original Lotion  Keri Skin Renewal Lotion Keri Silky Smooth Lotion  Keri Silky Smooth Sensitive Skin Lotion  Nivea Body Creamy Conditioning Oil  Nivea Body Extra Enriched Teacher, adult education Moisturizing Lotion Nivea Crme  Nivea Skin Firming Lotion  NutraDerm 30 Skin Lotion  NutraDerm Skin Lotion  NutraDerm Therapeutic Skin Cream  NutraDerm Therapeutic Skin Lotion  ProShield Protective Hand Cream  Provon moisturizing lotion

## 2023-01-07 LAB — IGE: IgE (Immunoglobulin E), Serum: 22 [IU]/mL (ref 6–495)

## 2023-01-08 ENCOUNTER — Encounter: Payer: Self-pay | Admitting: Orthopedic Surgery

## 2023-01-08 NOTE — Progress Notes (Signed)
Perioperative / Anesthesia Services  Pre-Admission Testing Clinical Review / Pre-Operative Anesthesia Consult  Date: 01/08/23  Patient Demographics:  Name: [name] DOB:   Jan 30, 1946 MRN:   [id number]  Planned Surgical Procedure(s):    Case: [id number] Date/Time: 01/14/23 0700   Procedure: COMPUTER ASSISTED TOTAL KNEE ARTHROPLASTY (Right: Knee)   Anesthesia type: Choice   Pre-op diagnosis: PRIMARY OSTEOARTHRITIS OF RIGHT KNEE.   Location: [facility] OR ROOM 01 / [facility]   Surgeons: Donato Heinz, MD     NOTE: Available PAT nursing documentation and vital signs have been reviewed. Clinical nursing staff has updated patient's Castaneda/PSHx, current medication list, and drug allergies/intolerances to ensure comprehensive history available to assist in medical decision making as it pertains to the aforementioned surgical procedure and anticipated anesthetic course. Extensive review of available clinical information personally performed. [name] and PSHx updated with any diagnoses/procedures that  may have been inadvertently omitted during her intake with the pre-admission testing department's nursing staff.  Clinical Discussion:  [name] is a 77 y.o. female who is submitted for pre-surgical anesthesia review and clearance prior to her undergoing the above procedure. Patient has never been a smoker. Pertinent Castaneda includes: CAD, HFpEF, NSVT, symptomatic bradycardia (s/p PPM placement), PAT, aortic atherosclerosis, HTN, HLD, T2DM, hypothyroidism, CKD-III, elevated RIGHT hemidiaphragm. OSAH (requires nocturnal PAP therapy), GERD (on daily PPI) hiatal hernia, anemia, OA, chronic back pain secondary to lumbar DDD (s/p laminectomy + decompression + fusion L2-S1), lymphedema, anxiety, depression, insomnia.  Patient is followed by cardiology Ana Milling, MD). She was last seen in the cardiology clinic on 12/07/2022; notes reviewed. At the time of her clinic visit, patient  doing well overall from a cardiovascular perspective.  Patient complained of some mild shortness of breath.  She also described an episode of dizziness that occurred a week prior to her visit.  Patient questioning possible TIA, however she was not evaluated in the ED.  Patient reports that she felt better the next morning with full resolution of symptoms.  Patient denied any chest pain, PND, orthopnea, palpitations, significant peripheral edema, weakness, fatigue, or presyncope/syncope. Patient with a past medical history significant for cardiovascular diagnoses. Documented physical exam was grossly benign, providing no evidence of acute exacerbation and/or decompensation of the patient's known cardiovascular conditions.  Patient has undergone multiple cardiac catheterizations in the past  Cardiac catheterization performed on 06/11/2007 revealing multivessel CAD; 30% D1 and 30% proximal RCA.  Given the nonobstructive degree of her coronary artery disease, the decision was made to defer intervention opting for medical management.  Repeat cardiac catheterization was performed on 07/25/2011 revealing multivessel CAD; 50% mid LAD, 50% D1, and 50% proximal RCA.  Again, given that coronary artery disease was nonobstructive, intervention was deferred opting for continued medical management.  Repeat diagnostic LEFT heart catheterization was performed on 10/01/2017 revealing multivessel CAD; 50% proximal to mid LAD and 80% ostial D1.  Cardiology noted that D1 lesion was not readily amenable to PCI given small size and ostial location.  Recommendations were for continued medical therapy.  Diagnostic RIGHT/LEFT heart catheterization performed on 12/26/2021 revealing multivessel CAD; 50% proximal and mid LAD and 80% ostial D1-D1.  Again, D1 lesion not felt to be amenable to PCI.  Intervention was deferred opting for continued medical management.  Hemodynamics: mean RA = 15 mmHg, mean PA = 29 mmHg, mean PCWP = 25 mmHg,  AO saturation 94%, PA saturation 69%, CO = 6.0 L/min, CI = 3.1 L/min/m, and LVEDP 20  mmHg.  Patient with a history of symptomatic bradycardia.  ILR was placed on 10/17/2017.  Loop recorder revealed prolonged bradycardia with episodes lasting longer than 30 seconds.  There were at least 13 seconds of asystole with occasional scattered PVCs.  Electrophysiology was consulted.    Linq device was removed and replaced with a Biotronik Edora 8 DR-T (SN: [id number])  permanent pacemaker on 03/04/2019.  Device is regularly interrogated by patient's primary electrophysiology team.  Last interrogation was on 11/28/2022, at which time device was noted to be functioning properly.  Myocardial perfusion imaging study was performed on 09/08/2020 revealing a normal left ventricular systolic function with hyperdynamic LVEF of 67%.  There was no evidence of stress-induced myocardial ischemia or arrhythmia; no scintigraphic evidence of scar.  CT attenuation correction images showed mild aortic atherosclerosis and mild coronary calcifications.  Overall, study was determined to be low risk.  Coronary CTA was performed on 08/10/2021 that demonstrated an Agatston coronary artery calcium score of 897. This placed patient in the 92nd percentile for age, sex, and race matched controls. Calcium depositions noted to be moderate in the LAD and D1 distributions (50-69%) and mild and the proximal RCA and mid LCx distributions (25-49%) study demonstrates normal coronary origin with LEFT dominance.  Study was sent for [facility] analysis (ranges: < 0.75 high likelihood of hemodynamically significant stenosis, 0.76-0.80 borderline, > 0.80 normal):   Left Main:  No significant stenosis LAD: No significant stenosis.  FFRct 0.88 LCX: No significant stenosis.  FFRct 0.85 RCA: No significant stenosis.  FFRct 0.95  Most recent TTE was performed on 12/26/2021 revealing a normal left ventricular systolic function with an EF of 55 to 60%.  There were  no regional wall motion abnormalities.  Mild LVH observed. Left ventricular diastolic Doppler parameters consistent with pseudonormalization (G2DD).  Mitral valve was degenerative with mild associated valvular regurgitation.  Aortic valve mildly thickened/sclerotic. All transvalvular gradients were noted to be normal providing no evidence suggestive of valvular stenosis. Aorta normal in size with no evidence of aneurysmal dilatation.  Blood pressure reasonably controlled at 130/62 mmHg on currently prescribed diuretic (torsemide + metolazone) therapies.  Patient is on a PCSK9i (alirocumab) for her HLD diagnosis and ASCVD prevention.  Patient has a supply of short acting nitrates (NTG) to use on a as needed basis for recurrent angina/anginal equivalent symptoms; denied recent use.  T2DM well controlled on currently prescribed regimen; last HgbA1c was 6.3% when checked on 12/07/2022.  In the setting of known cardiovascular diagnoses and concurrent T2DM, patient is on an SGLT2i (empagliflozin) for added cardiovascular and renovascular protection.  She does have an OSAH diagnosis, and is reported to be compliant with prescribed nocturnal PAP therapy. Patient is able to complete all of her  ADL/IADLs without cardiovascular limitation.  Per the DASI, patient is able to achieve at least 4 METS of physical activity without experiencing any significant degree of angina/anginal equivalent symptoms. No changes were made to her medication regimen during her visit with cardiology.  Patient scheduled to follow-up with outpatient cardiology in 6 months or sooner if needed.  [name] is scheduled for an elective COMPUTER ASSISTED TOTAL KNEE ARTHROPLASTY (Right: Knee) on 01/14/2023 with Dr. Francesco Sor, MD.  Given patient's past medical history significant for cardiovascular diagnoses, presurgical cardiac clearance was sought by the PAT team. Per cardiology, "this patient is optimized for surgery and may proceed with the  planned procedural course with a ACCEPTABLE risk of significant perioperative cardiovascular complications".  In review of her  medication reconciliation, it is noted that patient is currently on prescribed daily antithrombotic therapy.  Given patient's significant cardiovascular history, orthopedics has cleared for patient to continue on her daily low-dose ASA throughout her perioperative course.  Patient has been updated on preoperative directives regarding her aspirin.  Patient reports previous perioperative complications with anesthesia in the past.  Patient reporting a history of (+) aspiration with supraglottic airway (LMA) removal during a past wrist surgery. In review of the available records, it is noted that patient underwent a general anesthetic course here at [facility] (ASA IV) in 06/2022 without documented complications.      01/03/2023    1:21 PM 12/07/2022   10:51 AM 10/31/2022    9:00 AM  Vitals with BMI  Height 5\' 2"  5\' 2"    Weight 197 lbs 203 lbs   BMI 36.02 37.12   Systolic 123 130 161  Diastolic 56 62 91  Pulse 72 78 80    Providers/Specialists:   NOTE: Primary physician provider listed below. Patient may have been seen by APP or partner within same practice.   PROVIDER ROLE / SPECIALTY LAST OV  Hooten, Illene Labrador, MD Orthopedics (Surgeon) 12/27/2022  Danella Penton, MD Primary Care Provider 12/14/2022  Julien Nordmann, MD Cardiology 12/07/2022  Merri Ray, MD Physiatry 12/31/2022  Verdis Frederickson, MD Endocrinology 12/27/2022   Allergies:  Ezetimibe, Hydrocodone-acetaminophen, Metoclopramide, Propoxyphene, Cefuroxime, Hydrocodone, Tramadol, Ceftin [cefuroxime axetil], Codeine, Penicillins, Statins, and Vicodin [hydrocodone-acetaminophen]  Current Home Medications:   No current facility-administered medications for this encounter.    Acetaminophen (TYLENOL ARTHRITIS PAIN PO)   albuterol (PROVENTIL) (2.5 MG/3ML) 0.083%  nebulizer solution   albuterol (VENTOLIN HFA) 108 (90 Base) MCG/ACT inhaler   aspirin 81 MG tablet   atenolol (TENORMIN) 25 MG tablet   cholecalciferol (VITAMIN D3) 25 MCG (1000 UNIT) tablet   Crisaborole (EUCRISA) 2 % OINT   diclofenac Sodium (VOLTAREN) 1 % GEL   doxycycline (PERIOSTAT) 20 MG tablet   empagliflozin (JARDIANCE) 10 MG TABS tablet   esomeprazole (NEXIUM) 40 MG capsule   gabapentin (NEURONTIN) 300 MG capsule   Insulin Aspart (NOVOLOG FLEXPEN [place])   LANTUS SOLOSTAR 100 UNIT/ML Solostar Pen   levothyroxine (SYNTHROID, LEVOTHROID) 75 MCG tablet   mometasone (ELOCON) 0.1 % cream   nitroGLYCERIN (NITROSTAT) 0.4 MG SL tablet   ondansetron (ZOFRAN) 4 MG tablet   potassium chloride (KLOR-CON) 10 MEQ tablet   PRALUENT 150 MG/ML SOAJ   ranolazine (RANEXA) 500 MG 12 hr tablet   ropinirole (REQUIP) 5 MG tablet   Semaglutide,0.25 or 0.5MG /DOS, (OZEMPIC, 0.25 OR 0.5 MG/DOSE,) 2 MG/1.5ML SOPN   temazepam (RESTORIL) 30 MG capsule   torsemide (DEMADEX) 20 MG tablet   trolamine salicylate (ASPERCREME) 10 % cream   metolazone (ZAROXOLYN) 2.5 MG tablet   NON FORMULARY   traMADol (ULTRAM) 50 MG tablet    dexamethasone (DECADRON) injection 10 mg   History:   Past Medical History:  Diagnosis Date   Anemia    Anxiety    Aortic atherosclerosis (HCC)    Arthritis    CAD (coronary artery disease)    a.) LHC 06/11/2007: 30% D1, 30% pRCA - med mgmt; b.) LHC 07/25/2011: 50% mLAD, 50% D1, 50% pRCA - med mgmt; c.) LHC 10/01/2017: 50% p-mLAD, 80% D1 - med mgmt; d.) [facility] 12/26/2021: 50% p-mLAD, 80% oD1-D1. mRA 15, mPA 29, mPWCP 25, AO sat 94%, PA sat 69%, CO 6.0, CI 3.1, LVEDP 20   Chronic back pain  Chronic heart failure with preserved ejection fraction (HFpEF) (HCC)    a.) TTE 07/26/2017: EF 60-65%, no RWMAs, mild LVH, G1DD; b.) TTE 07/17/2017: EF 60-65%, no RWMAs, G2DD, norm RVSF; c.) TTE 09/08/2020: EF 60-65%, no RWMAs, G2DD, norm RVSF; d.) TTE 12/26/2021: EF 55-60%, mild LVH, G2DD, mild  MR, AoV sclerosis without stenosis   CKD (chronic kidney disease), stage III (HCC)    Complication of anesthesia 2018   a.) aspirated with supraglottoic airway removal following wrist surgery   Constipation    DDD (degenerative disc disease), lumbar    a.) s/p BILATERAL laminectomies with decompression and fusion L2-S1; total of 2 surgeries (2017 and 2021) with subsequent development of associated post-operative infections each time   Depression    Diverticulosis    Dysplastic nevus 09/12/2017   L sup buttock - mild    Dysplastic nevus 09/18/2018   R sup med buttocks - mild    E coli infection    Elevated hemidiaphragm (RIGHT)    GERD (gastroesophageal reflux disease)    Headache    Hemorrhoids    History of 2019 novel coronavirus disease (COVID-19) 10/31/2022   History of bilateral cataract extraction    History of colon polyps    History of gout    History of hiatal hernia    History of loop recorder 10/17/2017   a.) LINQ ILR placed; b.) removed 03/04/2019   History of vertigo    Hyperlipidemia    Hypertension    Hypothyroidism    Insomnia    a.) on tamazepam qhs PRN   Left thyroid nodule    Long term current use of aspirin    Lymphedema    NSVT (nonsustained ventricular tachycardia) (HCC)    a.) in setting of acute respiratory failure secondary to AECOPD and CHF; aggressive diuresis + IV steroids led to NSVT + frequent PVCs --> Tx'd with metoprolol (significant fatigue with higher doses)   OSA on CPAP    Paroxysmal atrial tachycardia (HCC)    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   a.) s/p placement of Biotronik Edora 8 DR-T DC PPM on 03/04/2019   Restless leg syndrome    a.) on ropinirole   Rosacea    Statin intolerance    Symptomatic bradycardia    a.) s/p placement of Biotronik Edora 8 DR-T DC PPM on 03/04/2019   Syncope    a.) documented bradycardia and pauses --> s/p Biotronik Edora 8 DR-T DC PPM placement on 03/04/2019   T2DM (type 2 diabetes  mellitus) (HCC)    Varicose veins    Past Surgical History:  Procedure Laterality Date   BACK SURGERY     CARPAL TUNNEL RELEASE Bilateral    CATARACT EXTRACTION W/ INTRAOCULAR LENS IMPLANT     CHOLECYSTECTOMY     COLONOSCOPY     COLONOSCOPY WITH PROPOFOL N/A 11/25/2018   Procedure: COLONOSCOPY WITH PROPOFOL;  Surgeon: Toledo, Boykin Nearing, MD;  Location: [facility] ENDOSCOPY;  Service: Gastroenterology;  Laterality: N/A;   DIAGNOSTIC LAPAROSCOPY     multiple times   DILATION AND CURETTAGE OF UTERUS     ESOPHAGOGASTRODUODENOSCOPY (EGD) WITH PROPOFOL N/A 11/01/2014   Procedure: ESOPHAGOGASTRODUODENOSCOPY (EGD) WITH PROPOFOL;  Surgeon: Wallace Cullens, MD;  Location: [facility] ENDOSCOPY;  Service: Gastroenterology;  Laterality: N/A;   ESOPHAGOGASTRODUODENOSCOPY (EGD) WITH PROPOFOL N/A 11/25/2018   Procedure: ESOPHAGOGASTRODUODENOSCOPY (EGD) WITH PROPOFOL;  Surgeon: Toledo, Boykin Nearing, MD;  Location: [facility] ENDOSCOPY;  Service: Gastroenterology;  Laterality: N/A;   ESOPHAGOGASTRODUODENOSCOPY (EGD) WITH PROPOFOL N/A 07/11/2022  Procedure: ESOPHAGOGASTRODUODENOSCOPY (EGD) WITH PROPOFOL;  Surgeon: Toledo, Boykin Nearing, MD;  Location: [facility] ENDOSCOPY;  Service: Gastroenterology;  Laterality: N/A;  IDDM   HARDWARE REMOVAL Left 10/11/2016   Procedure: HARDWARE REMOVAL-LEFT RADIUS;  Surgeon: Lyndle Herrlich, MD;  Location: [facility] ORS;  Service: Orthopedics;  Laterality: Left;  Left Radius Wrist    KNEE ARTHROSCOPY Right 05/09/2020   Procedure: ARTHROSCOPY KNEE;  Surgeon: Donato Heinz, MD;  Location: [facility] ORS;  Service: Orthopedics;  Laterality: Right;   LAMINECTOMY  11/13/2015   LEFT HEART CATH AND CORONARY ANGIOGRAPHY  06/11/2007   Procedure: LEFT HEART CATH AND CORONARY ANGIOGRAPHY; Location: [facility]; Surgeon: Arvilla Meres, MD   LEFT HEART CATH AND CORONARY ANGIOGRAPHY Left 10/01/2017   Procedure: LEFT HEART CATH AND CORONARY ANGIOGRAPHY;  Surgeon: Yvonne Kendall, MD;  Location: [facility] INVASIVE CV LAB;  Service:  Cardiovascular;  Laterality: Left;   LEFT HEART CATH AND CORONARY ANGIOGRAPHY Left 07/25/2011   Procedure: LEFT HEART CATH AND CORONARY ANGIOGRAPHY; Location: [facility]; Surgeon: Beverly Gust, MD   LOOP RECORDER INSERTION N/A 10/17/2017   Procedure: LOOP RECORDER INSERTION;  Surgeon: Duke Salvia, MD;  Location: [facility] INVASIVE CV LAB;  Service: Cardiovascular;  Laterality: N/A;   LOOP RECORDER REMOVAL N/A 03/04/2019   Procedure: LOOP RECORDER REMOVAL;  Surgeon: Duke Salvia, MD;  Location: [facility] INVASIVE CV LAB;  Service: Cardiovascular;  Laterality: N/A;   LUMBAR FUSION  11/2015   LUMBAR WOUND DEBRIDEMENT N/A 12/02/2015   Procedure: WOUND Exploration;  Surgeon: Lisbeth Renshaw, MD;  Location: [facility] OR;  Service: Neurosurgery;  Laterality: N/A;   OPEN REDUCTION INTERNAL FIXATION (ORIF) DISTAL RADIAL FRACTURE Left 08/29/2016   Procedure: OPEN REDUCTION INTERNAL FIXATION (ORIF) DISTAL RADIAL FRACTURE;  Surgeon: Lyndle Herrlich, MD;  Location: [facility] ORS;  Service: Orthopedics;  Laterality: Left;   PACEMAKER IMPLANT N/A 03/04/2019   Procedure: PACEMAKER IMPLANT;  Surgeon: Duke Salvia, MD;  Location: [facility] INVASIVE CV LAB;  Service: Cardiovascular;  Laterality: N/A;   PICC LINE PLACE PERIPHERAL ([facility] HX)     right upper arm    RIGHT/LEFT HEART CATH AND CORONARY ANGIOGRAPHY N/A 12/26/2021   Procedure: RIGHT/LEFT HEART CATH AND CORONARY ANGIOGRAPHY;  Surgeon: Yvonne Kendall, MD;  Location: [facility] INVASIVE CV LAB;  Service: Cardiovascular;  Laterality: N/A;   ROTATOR CUFF REPAIR Right    SAVORY DILATION N/A 11/01/2014   Procedure: SAVORY DILATION;  Surgeon: Wallace Cullens, MD;  Location: [facility] ENDOSCOPY;  Service: Gastroenterology;  Laterality: N/A;   TONSILLECTOMY     TOTAL ABDOMINAL HYSTERECTOMY W/ BILATERAL SALPINGOOPHORECTOMY     TRIGGER FINGER RELEASE Bilateral    Family History  Problem Relation Age of Onset   Heart disease Mother    Breast cancer Mother 67   Heart disease Father    Heart attack  Sister    Heart disease Sister    Heart disease Brother    Social History   Tobacco Use   Smoking status: Never   Smokeless tobacco: Never  Vaping Use   Vaping status: Never Used  Substance Use Topics   Alcohol use: No   Drug use: No    Pertinent Clinical Results:  LABS:   No visits with results within 3 Day(s) from this visit.  Latest known visit with results is:  Hospital Outpatient Visit on 01/03/2023  Component Date Value Ref Range Status   CRP 01/03/2023 0.9  <1.0 mg/dL Final   Performed at [facility], [address] [place], [place] [address]  Sed Rate 01/03/2023 43 (H)  0 - 30 mm/hr Final   Performed at [facility], [address] [place], [place] [address]   Hgb A1c MFr Bld 01/03/2023 6.2 (H)  4.8 - 5.6 % Final   Comment: (NOTE) Pre diabetes:          5.7%-6.4%  Diabetes:              >6.4%  Glycemic control for   <7.0% adults with diabetes    Mean Plasma Glucose 01/03/2023 131.24  mg/dL Final   Performed at [facility], [address], [place], [place] [address]   IgE (Immunoglobulin E), Serum 01/03/2023 22  6 - 495 IU/mL Final   Comment: (NOTE) Performed At: [facility] [address] [place], [place] [id number] Jolene Schimke MD [id number]    WBC 01/03/2023 10.5  4.0 - 10.5 K/uL Final   RBC 01/03/2023 4.53  3.87 - 5.11 MIL/uL Final   Hemoglobin 01/03/2023 11.4 (L)  12.0 - 15.0 g/dL Final   HCT 78/46/9629 36.3  36.0 - 46.0 % Final   MCV 01/03/2023 80.1  80.0 - 100.0 fL Final   MCH 01/03/2023 25.2 (L)  26.0 - 34.0 pg Final   MCHC 01/03/2023 31.4  30.0 - 36.0 g/dL Final   RDW 52/84/1324 17.3 (H)  11.5 - 15.5 % Final   Platelets 01/03/2023 209  150 - 400 K/uL Final   nRBC 01/03/2023 0.0  0.0 - 0.2 % Final   Neutrophils Relative % 01/03/2023 74  % Final   Neutro Abs 01/03/2023 7.8 (H)  1.7 - 7.7 K/uL Final   Lymphocytes Relative 01/03/2023 20  % Final   Lymphs Abs 01/03/2023 2.1  0.7 - 4.0 K/uL Final   Monocytes  Relative 01/03/2023 5  % Final   Monocytes Absolute 01/03/2023 0.5  0.1 - 1.0 K/uL Final   Eosinophils Relative 01/03/2023 1  % Final   Eosinophils Absolute 01/03/2023 0.1  0.0 - 0.5 K/uL Final   Basophils Relative 01/03/2023 0  % Final   Basophils Absolute 01/03/2023 0.0  0.0 - 0.1 K/uL Final   Immature Granulocytes 01/03/2023 0  % Final   Abs Immature Granulocytes 01/03/2023 0.03  0.00 - 0.07 K/uL Final   Performed at [facility], [address] [place], [place] [address]   Sodium 01/03/2023 139  135 - 145 mmol/L Final   Potassium 01/03/2023 3.6  3.5 - 5.1 mmol/L Final   Chloride 01/03/2023 99  98 - 111 mmol/L Final   CO2 01/03/2023 28  22 - 32 mmol/L Final   Glucose, Bld 01/03/2023 101 (H)  70 - 99 mg/dL Final   Glucose reference range applies only to samples taken after fasting for at least 8 hours.   BUN 01/03/2023 25 (H)  8 - 23 mg/dL Final   Creatinine, Ser 01/03/2023 1.25 (H)  0.44 - 1.00 mg/dL Final   Calcium 72/53/6644 9.2  8.9 - 10.3 mg/dL Final   Total Protein 03/47/4259 7.4  6.5 - 8.1 g/dL Final   Albumin 56/38/7564 4.0  3.5 - 5.0 g/dL Final   AST 33/29/5188 15  15 - 41 U/L Final   ALT 01/03/2023 13  0 - 44 U/L Final   Alkaline Phosphatase 01/03/2023 102  38 - 126 U/L Final   Total Bilirubin 01/03/2023 0.8  <1.2 mg/dL Final   GFR, Estimated 01/03/2023 44 (L)  >60 mL/min Final   Comment: (NOTE) Calculated using the CKD-EPI Creatinine Equation (2021)  Anion gap 01/03/2023 12  5 - 15 Final   Performed at [facility], [address] [place], [place] [address]   Color, Urine 01/03/2023 YELLOW (A)  YELLOW Final   APPearance 01/03/2023 CLEAR (A)  CLEAR Final   Specific Gravity, Urine 01/03/2023 1.018  1.005 - 1.030 Final   pH 01/03/2023 5.0  5.0 - 8.0 Final   Glucose, UA 01/03/2023 >=500 (A)  NEGATIVE mg/dL Final   Hgb urine dipstick 01/03/2023 NEGATIVE  NEGATIVE Final   Bilirubin Urine 01/03/2023 NEGATIVE  NEGATIVE Final   Ketones, ur 01/03/2023  NEGATIVE  NEGATIVE mg/dL Final   Protein, ur 19/14/7829 NEGATIVE  NEGATIVE mg/dL Final   Nitrite 56/21/3086 NEGATIVE  NEGATIVE Final   Leukocytes,Ua 01/03/2023 NEGATIVE  NEGATIVE Final   RBC / HPF 01/03/2023 0  0 - 5 RBC/hpf Final   WBC, UA 01/03/2023 0-5  0 - 5 WBC/hpf Final   Bacteria, UA 01/03/2023 RARE (A)  NONE SEEN Final   Squamous Epithelial / HPF 01/03/2023 0-5  0 - 5 /HPF Final   Performed at [facility], [address] [place], [place] [address]   MRSA, PCR 01/03/2023 NEGATIVE  NEGATIVE Final   Staphylococcus aureus 01/03/2023 NEGATIVE  NEGATIVE Final   Comment: (NOTE) The Xpert SA Assay (FDA approved for NASAL specimens in patients 2 years of age and older), is one component of a comprehensive surveillance program. It is not intended to diagnose infection nor to guide or monitor treatment. Performed at [facility], [address] [place], [place] [address]     ECG: Date: 01/03/2023 Time ECG obtained: 1446 PM Rate: 74 bpm Rhythm:  Atrial paced rhythm Axis (leads I and aVF): Normal Intervals: PR 182 ms. QRS 80 ms. QTc 452 ms. ST segment and T wave changes: No evidence of acute ST segment elevation or depression.   Comparison: Similar to previous tracing obtained on 12/07/2022   IMAGING / PROCEDURES: CT ABDOMEN PELVIS W CONTRAST performed on 11/02/2022 Acute diverticulitis of the sigmoid colon in the left lower quadrant. No evidence of perforation or abscess.  Normal appendix. Small sliding hiatal hernia. Aortic atherosclerosis Atherosclerotic coronary artery disease.  CT ANGIO CHEST PULMONARY EMBOLISM (PE) W OR WO CONTRAST performed on 10/30/2022 No evidence of pulmonary embolus. No acute intrathoracic process. Small hiatal hernia. Stable heterogeneous enlargement and nodularity throughout the left lobe thyroid. Nonemergent outpatient thyroid ultrasound recommended if not previously performed. Aortic atherosclerosis  Coronary artery  atherosclerosis.  RIGHT/LEFT HEART CATHETERIZATION AND CORONARY ANGIOGRAPHY performed on 12/26/2021 Normal left ventricular systolic function and EF Elevated LVEDP = 20 mmHg Coronary artery disease 50% proximal to mid LAD 80% ostial D1-D1.  Not well-suited for PCI. Hemodynamics Mean RA = 15 mmHg Mean PA = 29 mmHg Mean PCWP = 25 mmHg AO saturation = 94% PA saturation = 69% CO = 6.0 L/min CI = 3.1 L/min/m  TRANSTHORACIC ECHOCARDIOGRAM performed on 12/26/2021 Left ventricular ejection fraction, by estimation, is 55 to 60%. The left ventricle has normal function. The left ventricle has no regional wall motion abnormalities. There is mild left ventricular hypertrophy. Left ventricular diastolic parameters are consistent with Grade II diastolic dysfunction (pseudonormalization). Elevated left atrial pressure.  Right ventricular systolic function is normal. The right ventricular size is normal.  The mitral valve is degenerative. Mild mitral valve regurgitation. No evidence of mitral stenosis.  The aortic valve is tricuspid. There is mild calcification of the aortic valve. There is mild thickening of the aortic valve. Aortic valve regurgitation is  not visualized. Aortic valve sclerosis/calcification is present, without any evidence of aortic stenosis.   CT CORONARY MORPH W/CTA COR W/SCORE W/CA W/CM &/OR WO/CM W. FFR ANALYSIS performed on 08/10/2021 Small hiatal hernia Coronary calcium score of 897. This was 92nd percentile for age and sex matched control. Normal coronary origin with left dominance. Calcified plaque causing moderate proximal LAD and D1 stenosis (50-69%). Calcified plaque causing mild proximal RCA and mid LCx stenosis (25-49%). CAD-RADS 3. Moderate stenosis. Consider symptom-guided anti-ischemic pharmacotherapy as well as risk factor modification per guideline directed care. CT FFR (ranges: < 0.75 high likelihood of hemodynamically significant stenosis, 0.76-0.80 borderline, >  0.80 normal): Left Main:  No significant stenosis. LAD: No significant stenosis.  FFRct 0.88 LCX: No significant stenosis.  FFRct 0.85 RCA: No significant stenosis.  FFRct 0.95  MYOCARDIAL PERFUSION IMAGING STUDY (LEXISCAN) performed on 09/08/2020 Pharmacological myocardial perfusion imaging study with no significant  ischemia Normal wall motion EF estimated at 67% No EKG changes concerning for ischemia at peak stress or in recovery. CT attenuation correction images with mild aortic atherosclerosis and mild coronary calcification  Low risk scan  Impression and Plan:  [name] has been referred for pre-anesthesia review and clearance prior to her undergoing the planned anesthetic and procedural courses. Available labs, pertinent testing, and imaging results were personally reviewed by me in preparation for upcoming operative/procedural course. [facility] medical record has been updated following extensive record review and patient interview with PAT staff.   This patient has been appropriately cleared by cardiology with an overall septal risk of experiencing significant perioperative cardiovascular complications. Completed perioperative prescription for cardiac device management documentation completed by primary cardiology team and placed on patient's chart for review by the surgical/anesthetic team on the day of her procedure. Electrophysiology indicating that procedure should not interfere with planned surgical procedure. Beyond normal perioperative cardiovascular monitoring, there are no recommendations from electrophysiology team that prompt further discussion/recommendations from industry representative.   Based on clinical review performed today (01/08/23), barring any significant acute changes in the patient's overall condition, it is anticipated that she will be able to proceed with the planned surgical intervention. Any acute changes in clinical condition may necessitate her  procedure being postponed and/or cancelled. Patient will meet with anesthesia team (MD and/or CRNA) on the day of her procedure for preoperative evaluation/assessment. Questions regarding anesthetic course will be fielded at that time.   Pre-surgical instructions were reviewed with the patient during her PAT appointment, and questions were fielded to satisfaction by PAT clinical staff. She has been instructed on which medications that she will need to hold prior to surgery, as well as the ones that have been deemed safe/appropriate to take on the day of her procedure. As part of the general education provided by PAT, patient made aware both verbally and in writing, that she would need to abstain from the use of any illegal substances during her perioperative course.  She was advised that failure to follow the provided instructions could necessitate case cancellation or result in serious perioperative complications up to and including death. Patient encouraged to contact PAT and/or her surgeon's office to discuss any questions or concerns that may arise prior to surgery; verbalized understanding.   Quentin Mulling, MSN, APRN, FNP-C, CEN [facility]  Perioperative Services Nurse Practitioner Phone: [phone] Fax: [phone] 01/08/23 3:47 PM  NOTE: This note has been prepared using Dragon dictation software. Despite my best ability to proofread, there is always the potential that  unintentional transcriptional errors may still occur from this process.

## 2023-01-10 NOTE — H&P (Signed)
ORTHOPAEDIC HISTORY & PHYSICAL Hydee Fleece, Adelina Mings., MD - 12/27/2022 2:00 PM EST Formatting of this note is different from the original. Images from the original note were not included. Chief Complaint: Chief Complaint Patient presents with Right knee degenerative arthrosis  Reason for Visit: The patient is a 77 y.o. female who presents today for reevaluation of her right knee. She has a long history of right knee pain. She underwent right knee arthroscopy, partial medial meniscectomy, and chondroplasty in March 2022. 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 The knee pain has gradually progressed. She localizes most of the pain along the medial aspect of the knee. She reports some swelling, no locking, and some giving way of the knee. The pain is aggravated by any weight bearing. The knee pain limits the patient's ability to ambulate long distances. The patient has not appreciated any significant improvement despite Tylenol, topical NSAIDs, activity modification, intraarticular corticosteroid injections, and viscosupplementation. She is not using any ambulatory aids. The patient states that the knee pain has progressed to the point that it is significantly interfering with her activities of daily living.  Medications: Current Outpatient Medications Medication Sig Dispense Refill acetaminophen (TYLENOL) 650 MG ER tablet Take 1,300 mg by mouth every 6 (six) hours as needed albuterol 90 mcg/actuation inhaler Inhale 2 inhalations into the lungs every 6 (six) hours as needed 1 each 1 alirocumab (PRALUENT PEN) 150 mg/mL PnIj Inject 1 pen! subcutaneously every 14 (fourteen) days aspirin 81 MG EC tablet Take 1 tablet (81 mg total) by mouth nightly 0 atenoloL (TENORMIN) 25 MG tablet take 1 tablet by mouth twice a day 180 tablet 2 blood-glucose sensor Use 1 each every 14 (fourteen) days (Patient taking differently: Use 1 each every 14 (fourteen) days Libre  3) 6 each 4 cholecalciferol (VITAMIN D3) 2,000 unit tablet Take 1,000 Units by mouth once daily diazePAM (VALIUM) 5 MG tablet 1-2 30 minutes before ESI 2 tablet 0 diclofenac (VOLTAREN) 1 % topical gel Apply 2 g topically 2 (two) times daily doxycycline (PERIOSTAT) 20 MG tablet Take 20 mg by mouth 2 (two) times daily esomeprazole (NEXIUM) 40 MG DR capsule Take 1 capsule (40 mg total) by mouth 2 (two) times daily 180 capsule 3 EUCRISA 2 % Oint Apply 1 Application topically once daily as needed Apply 1 Application topically as directed. qd to bid to aa eczema on back as needed for flares flash glucose scanning (FREESTYLE LIBRE 2 READER) reader Use 1 Device as directed 1 each 1 FREESTYLE LIBRE 2 SENSOR kit Inject 6 kits subcutaneously every 14 (fourteen) days for glucose monitoring. LIBRE 2 not the libre 14 day. 6 kit 4 gabapentin (NEURONTIN) 300 MG capsule Take 1 capsule (300 mg total) by mouth 3 (three) times daily 270 capsule 3 insulin ASPART (NOVOLOG FLEXPEN) pen injector (concentration 100 units/mL) Use as needed based on carb intake and blood sugar. Max daily dose 50 units 45 mL 3 insulin GLARGINE (LANTUS SOLOSTAR) pen injector (concentration 100 units/mL) Inject subcutaneously at bedtime PER PATIENT TAKING.TAKES 20 UNITS PER DAY. JARDIANCE 10 mg tablet take 1 tablet daily 90 tablet 3 levothyroxine (SYNTHROID) 75 MCG tablet Take 1 tablet (75 mcg total) by mouth every morning before breakfast (0630) ON AN EMPTY STOMACH WITH A GLASS OF WATER AT LEAST 30 TO 60 MINUTES BEFORE BREAKFAST 90 tablet 3 methocarbamoL (ROBAXIN) 500 MG tablet Take 500 mg by mouth 3 (three) times daily as needed metOLazone (ZAROXOLYN) 2.5 MG tablet Take 1  tablet (2.5 mg total) by mouth once daily as needed (Fluid) 30 tablet 0 nitroGLYcerin (NITROSTAT) 0.4 MG SL tablet Place 0.4 mg under the tongue every 5 (five) minutes as needed for Chest pain May take up to 3 doses. ondansetron (ZOFRAN-ODT) 4 MG disintegrating tablet Take 4  mg by mouth every 6 (six) hours as needed pen needle, diabetic (BD NANO 2ND GEN PEN NEEDLE) 32 gauge x 5/32" Ndle Use 1 each 4 (four) times daily 400 each 4 potassium chloride (KLOR-CON) 10 MEQ ER tablet Take 2 tablets (20 mEq total) by mouth 3 (three) times daily ranolazine (RANEXA) 500 MG ER tablet Take 1 tablet (500 mg total) by mouth 2 (two) times daily 180 tablet 3 rOPINIRole (REQUIP) 5 MG tablet Take 1 tablet (5 mg total) by mouth at bedtime 90 tablet 3 semaglutide (OZEMPIC) 0.25 mg or 0.5 mg (2 mg/3 mL) pen injector Inject 0.375 mLs (0.25 mg total) subcutaneously once a week temazepam (RESTORIL) 30 mg capsule take 1 capsule by mouth everyday at bedtime 90 capsule 1 TORsemide (DEMADEX) 20 MG tablet Take 2 tablets (40 mg total) by mouth once daily 1 or 2 daily for fluid traMADoL (ULTRAM) 50 mg tablet Take 1 tablet (50 mg total) by mouth once daily as needed trolamine salicylate (ASPERCREME) 10 % cream Apply topically 2 (two) times daily  No current facility-administered medications for this visit.  Allergies: Allergies Allergen Reactions Hydrocodone-Acetaminophen Syncope Cefuroxime Other (See Comments) Cefuroxime Axetil Diarrhea Codeine Rash Hydrocodone Bitartrate Other (See Comments) Metoclopramide Diarrhea Metrogel [Metronidazole-Skin Cleanser] Rash Penicillins Rash Propoxyphene Other (See Comments) Unsure of reaction type   Statins-Hmg-Coa Reductase Inhibitors Muscle Pain Leg pain, restless leg syndrome Victoza [Liraglutide] Nausea Zetia [Ezetimibe] Diarrhea and Nausea  Past Medical History: Past Medical History: Diagnosis Date Adult idiopathic generalized osteoporosis 10/08/2016 Anemia July, 2021 Two lumbar surgeries Anxiety disorder, unspecified 11/18/2015 CHF (congestive heart failure) (CMS/HHS-HCC) 07/2016 diastolic CHF CKD (chronic kidney disease) stage 3, GFR 30-59 ml/min (CMS/HHS-HCC) 04/15/2017 Coronary disease 50% LESIONS X3, RCA/LAD Diabetes mellitus type  2, uncomplicated (CMS/HHS-HCC) GERD (gastroesophageal reflux disease) Hyperlipidemia 09/08/2013 Hypertension 10+year Obesity Pacemaker 03/04/2019 per [facility] records RLS (restless legs syndrome) Sleep apnea ON CPAP Surgical menopause  Past Surgical History: Past Surgical History: Procedure Laterality Date CATARACT EXTRACTION 09/2014 EGD 11/01/2014 Benign esophageal stricture/hiatus hernia/Negative biopsy/No Repeat/PYO Trigger thumb release Left 09/21/2015 Dr.Menz LAMINECTOMY LUMBAR SPINE 11/14/2015 mulit level disc fusion--- L3-S1 other 12/02/2015 e.coli infection in spine picc insertion 12/06/2015 INCISION & DRAINAGE ABSCESS BACK 02/2016 ORIF RADIAL HEAD / NECK FRACTURE Left 08/2016 FRACTURE SURGERY 08/2016 COLONOSCOPY 11/25/2018 Tubular adenoma of the colon/Hyperplastic colon polyp CBF 11/2023 EGD 11/25/2018 Esophageal Stenosis.Dilated No repeat per TKT PERCUTANEOUS SPINAL FUSION N/A 08/10/2019 Procedure: L2-3 XLIF with Posterior Fusion at L2-3; Surgeon: Bethel Born, MD; Location: [facility] OR; Service: Neurosurgery; Laterality: N/A; OBLIQUE LATERAL INTERBODY FUSION LUMBAR N/A 08/10/2019 Procedure: ARTHRODESIS, ANTERIOR INTERBODY TECHNIQUE, INCLUDING MINIMAL DISCECTOMY TO PREPARE INTERSPACE (OTHER THAN FOR DECOMPRESSION); LUMBAR; Surgeon: Bethel Born, MD; Location: [facility] OR; Service: Neurosurgery; Laterality: N/A; INSERTION INTERBODY BIOMECHANICAL DEVICE W/ INSTRUMENTATION N/A 08/10/2019 Procedure: INSERTION INTERBODY BIOMECHANICAL DEVICE WITH INTEGRAL ANTERIOR INSTRUMENTATION, TO INTERVERTEBRAL DISC SPACE IN CONJUNCTION INTERBODY ARTHRODESIS, EACH INTERSPACE; Surgeon: Bethel Born, MD; Location: [facility] OR; Service: Neurosurgery; Laterality: N/A; INSTRUMENTATION NON-SEGMENTAL POSTERIOR SPINE N/A 08/10/2019 Procedure: INSTRUMENTATION POSTERIOR SPINE 1/2 VERTEBRAL SEGMENTS W/O FIXATION ADDITIONAL FIFTH; Surgeon: Bethel Born,  MD; Location: [facility] OR; Service: Neurosurgery; Laterality: N/A; INTRAOPERATIVE FLUOROSCOPY N/A 08/10/2019 Procedure: FLUOROSCOPY; Surgeon: Bethel Born, MD; Location: [facility] OR; Service:  Neurosurgery; Laterality: N/A; INCISION & DRAINAGE POSTERIOR SPINE SACRAL/LUMBOSACRAL N/A 08/24/2019 Procedure: INCISION AND DRAINAGE, OPEN, OF DEEP ABSCESS (SUBFASCIAL),LUMBAR AREA,L2-L4 POSTERIOR SPINE WITH REMOVAL OF INSTRUMENTATION AND PLACEMENT OF NEW L2-L4 INSTRUMENTATION; Surgeon: Bethel Born, MD; Location: [facility] OR; Service: Neurosurgery; Laterality: N/A; FLAP MYOCUTANEOUS/FASCIOCUTANEOUS BACK Bilateral 08/24/2019 Procedure: MUSCLE, MYOCUTANEOUS, OR FASCIOCUTANEOUS FLAP; BACK; Surgeon: Jae Dire, MD; Location: [facility] OR; Service: Plastic Surgery; Laterality: Bilateral; DEBRIDEMENT BACK N/A 09/14/2019 Procedure: DEBRIDEMENT, BACK; MUSCLE AND/OR FASCIA (INCLUDES EPIDERMIS, DERMIS, AND SUBCUTANEOUS TISSUE, IF PERFORMED); FIRST 20 SQ CM OR LESS; Surgeon: Jae Dire, MD; Location: [facility] OR; Service: Plastic Surgery; Laterality: N/A; Right knee arthroscopy, partial medial meniscectomy, and chondroplasty 05/09/2020 Dr Ernest Pine EGD @ [facility] 07/11/2022 Gastritis/Hyperplastic gastric polyp/No repeat/TKT CARDIAC CATHETERIZATION 6-7 years ago CHOLECYSTECTOMY DILATION AND CURETTAGE, DIAGNOSTIC / THERAPEUTIC ENDOSCOPIC CARPAL TUNNEL RELEASE Bilateral HYSTERECTOMY A about age 44 INSERT / REPLACE / REMOVE PACEMAKER January, 2021 [employer] KNEE ARTHROSCOPY 05-09-20 OTHER SURGERY Pacemaker implant March 04 2019 Dr. Sherryl Manges OTHER SURGERY Back surgery fusion L2-L3 REPAIR ROTATOR CUFF TEAR ACUTE OPEN Right Right Trigger Thumb Release TAH/BSO TONSILLECTOMY WISDOM TEETH EXTRACTION  Social History: Social History  Socioeconomic History Marital status: Married Spouse name: Dorene Sorrow Number of children: 2 Years of education: 14+ Occupational History Occupation: Retired  Sports coach Tobacco Use Smoking status: Never Smokeless tobacco: Never Vaping Use Vaping status: Never Used Substance and Sexual Activity Alcohol use: Never Drug use: Never Sexual activity: Not Currently Partners: Male Birth control/protection: Surgical Social History Narrative Feels safe in home.Retired.  Social Drivers of Health  Financial Resource Strain: Low Risk (12/14/2022) Overall Financial Resource Strain (CARDIA) Difficulty of Paying Living Expenses: Not hard at all Food Insecurity: No Food Insecurity (12/14/2022) Hunger Vital Sign Worried About Running Out of Food in the Last Year: Never true Ran Out of Food in the Last Year: Never true Transportation Needs: No Transportation Needs (12/14/2022) PRAPARE - Contractor (Medical): No Lack of Transportation (Non-Medical): No Housing Stability: Low Risk (12/14/2022) Housing Stability Vital Sign Unable to Pay for Housing in the Last Year: No Number of Times Moved in the Last Year: 0 Homeless in the Last Year: No  Family History: Family History Problem Relation Name Age of Onset Coronary Artery Disease (Blocked arteries around heart) Mother Beau Fanny Myocardial Infarction (Heart attack) Mother Beau Fanny Breast cancer Mother Beau Fanny deceased 01-28-08 Cancer Mother Beau Fanny breast cancer Rheum arthritis Mother Beau Fanny Coronary Artery Disease (Blocked arteries around heart) Father Arvella Merles Prostate cancer Father Arvella Merles Myocardial Infarction (Heart attack) Father Arvella Merles Cancer Father Arvella Merles prostate cancer Myocardial Infarction (Heart attack) Sister twin multiple Other Sister twin low platelets Kidney cancer Sister twin Heart disease Sister twin bypass surgery, stent placements, Thyroid disease Sister Rockne Menghini Coronary Artery Disease (Blocked arteries around heart) Brother Raquel Sarna Multiple stents Coronary Artery Disease  (Blocked arteries around heart) Brother Lyn Elliot Gault Quadruple bypass Anxiety Daughter Meriel Pica Adoption Asthma Daughter Meriel Pica Adoption Bipolar disorder Daughter Meriel Pica Adoption Obesity Maternal Aunt Windell Moulding Swaim deceased Alcohol abuse Paternal Uncle Norton Pastel Heart disease Maternal Grandmother Iola McFarland Alzheimer's disease Maternal Grandmother Iola McFarland deceased Colon cancer Maternal Grandmother Iola McFarland Cancer Maternal Grandmother Iola McFarland Colon cancer Diabetes type II Maternal Grandfather Eusebio Friendly Stroke Paternal Grandmother Loraine Grip Stroke Paternal Grandfather Arvella Merles, Sr.  Review of Systems: A comprehensive 14 point ROS was performed, reviewed, and the pertinent orthopaedic findings are documented in the HPI.  Exam BP 134/72  Ht 159.8 cm (5' 2.9")  Wt 89.1 kg (196 lb 6.4 oz)  BMI 34.90 kg/m  General: Well-developed, well-nourished female seen in no acute distress. Antalgic gait. Varus thrust to the right knee.  HEENT: Atraumatic, normocephalic. Pupils are equal and reactive to light. Extraocular motion is intact. Sclera are clear. Oropharynx is clear with moist mucosa.  Neck: Supple, nontender, and with good ROM. No thyromegaly, adenopathy, JVD, or carotid bruits.  Lungs: Clear to auscultation bilaterally.  Cardiovascular: Regular rate and rhythm. Normal S1, S2. No murmur . No appreciable gallops or rubs. Peripheral pulses are palpable. No lower extremity edema. Homan`s test is negative.  Abdomen: Soft, nontender, nondistended. Bowel sounds are present.  Extremities: Good strength, stability, and range of motion of the upper extremities. Good range of motion of the hips and ankles.  Right Knee: Soft tissue swelling: minimal Effusion: none Erythema: none Crepitance: mild Tenderness: medial Alignment: relative varus Mediolateral laxity: medial  pseudolaxity Posterior sag: negative Patellar tracking: Good tracking without evidence of subluxation or tilt Atrophy: No significant atrophy. Quadriceps tone was fair to good. Range of motion: 0/3/108 degrees  Neurologic: Awake, alert, and oriented. Sensory function is intact to pinprick and light touch. Motor strength is judged to be 5/5. Motor coordination is within normal limits. No apparent clonus. No tremor.  X-rays: I ordered and interpreted standing AP, lateral, and sunrise radiographs of the right knee that were obtained in the office today. There is significant narrowing of the medial cartilage space with bone-on-bone articulation and associated varus alignment. Osteophyte formation is noted. Subchondral sclerosis is noted. No evidence of fracture or dislocation.  Impression: Degenerative arthrosis of the right knee  Plan: The findings were discussed in detail with the patient. The patient was given informational material on total knee replacement. Conservative treatment options were reviewed with the patient. We discussed the risks and benefits of surgical intervention. The usual perioperative course was also discussed in detail. The patient expressed understanding of the risks and benefits of surgical intervention and would like to proceed with plans for right total knee arthroplasty.  Hemoglobin A1c is an indication of glucose control. Uncontrolled diabetes has been associated with perioperative complications including poor wound healing and surgical site infections. To decrease the risk of perioperative complications, hemoglobin A1c must be less than 8.0 prior to surgery. The patient is encouraged to work with her primary care physician and/or endocrinologist to optimize glucose management.  Lab Results Component Value Date HGBA1C 6.3 (H) 12/07/2022 HGBA1C 6.5 (H) 09/19/2022  I reviewed notes from Dr. Mariah Milling (Cardiology) and Dr. Bethann Punches (Internal Medicine). The  patient has been cleared for surgery from their perspectives.  I spent a total of 45 minutes in both face-to-face and non-face-to-face activities, excluding procedures performed, for this visit on the date of this encounter.  MEDICAL CLEARANCE: Per anesthesiology. ACTIVITY: As tolerated. WORK STATUS: Not applicable. THERAPY: Preoperative physical therapy evaluation. MEDICATIONS: Requested Prescriptions  No prescriptions requested or ordered in this encounter  FOLLOW-UP: Return for preop History & Physical pending surgery date.  Jakelin Taussig P. Angie Fava., M.D.  This note was generated in part with voice recognition software and I apologize for any typographical errors that were not detected and corrected.  Electronically signed by Shari Heritage., MD at 12/30/2022 8:06 AM EST

## 2023-01-14 ENCOUNTER — Observation Stay
Admission: RE | Admit: 2023-01-14 | Discharge: 2023-01-15 | Disposition: A | Discharge status: Home / Self Care | Payer: Medicare Other | Source: Ambulatory Visit | Attending: Orthopedic Surgery | Admitting: Orthopedic Surgery

## 2023-01-14 ENCOUNTER — Observation Stay: Payer: Medicare Other

## 2023-01-14 ENCOUNTER — Ambulatory Visit: Payer: Medicare Other | Admitting: Urgent Care

## 2023-01-14 ENCOUNTER — Encounter
Admission: RE | Disposition: A | Discharge status: Home / Self Care | Payer: Self-pay | Source: Ambulatory Visit | Attending: Orthopedic Surgery

## 2023-01-14 ENCOUNTER — Other Ambulatory Visit: Payer: Self-pay

## 2023-01-14 ENCOUNTER — Encounter: Payer: Self-pay | Admitting: Orthopedic Surgery

## 2023-01-14 DIAGNOSIS — Z96651 Presence of right artificial knee joint: Secondary | ICD-10-CM

## 2023-01-14 DIAGNOSIS — Z7982 Long term (current) use of aspirin: Secondary | ICD-10-CM | POA: Insufficient documentation

## 2023-01-14 DIAGNOSIS — I5032 Chronic diastolic (congestive) heart failure: Secondary | ICD-10-CM | POA: Diagnosis not present

## 2023-01-14 DIAGNOSIS — I130 Hypertensive heart and chronic kidney disease with heart failure and stage 1 through stage 4 chronic kidney disease, or unspecified chronic kidney disease: Secondary | ICD-10-CM | POA: Diagnosis not present

## 2023-01-14 DIAGNOSIS — Z880 Allergy status to penicillin: Secondary | ICD-10-CM

## 2023-01-14 DIAGNOSIS — E1122 Type 2 diabetes mellitus with diabetic chronic kidney disease: Secondary | ICD-10-CM | POA: Insufficient documentation

## 2023-01-14 DIAGNOSIS — M1711 Unilateral primary osteoarthritis, right knee: Secondary | ICD-10-CM | POA: Diagnosis present

## 2023-01-14 DIAGNOSIS — R700 Elevated erythrocyte sedimentation rate: Secondary | ICD-10-CM

## 2023-01-14 DIAGNOSIS — Z794 Long term (current) use of insulin: Secondary | ICD-10-CM | POA: Insufficient documentation

## 2023-01-14 DIAGNOSIS — R8281 Pyuria: Secondary | ICD-10-CM

## 2023-01-14 DIAGNOSIS — N183 Chronic kidney disease, stage 3 unspecified: Secondary | ICD-10-CM | POA: Diagnosis not present

## 2023-01-14 DIAGNOSIS — I5030 Unspecified diastolic (congestive) heart failure: Secondary | ICD-10-CM | POA: Diagnosis not present

## 2023-01-14 DIAGNOSIS — N1832 Chronic kidney disease, stage 3b: Secondary | ICD-10-CM

## 2023-01-14 DIAGNOSIS — Z79899 Other long term (current) drug therapy: Secondary | ICD-10-CM | POA: Insufficient documentation

## 2023-01-14 DIAGNOSIS — E1165 Type 2 diabetes mellitus with hyperglycemia: Secondary | ICD-10-CM

## 2023-01-14 DIAGNOSIS — Z950 Presence of cardiac pacemaker: Secondary | ICD-10-CM | POA: Insufficient documentation

## 2023-01-14 DIAGNOSIS — M2550 Pain in unspecified joint: Secondary | ICD-10-CM

## 2023-01-14 DIAGNOSIS — E039 Hypothyroidism, unspecified: Secondary | ICD-10-CM

## 2023-01-14 DIAGNOSIS — R600 Localized edema: Secondary | ICD-10-CM

## 2023-01-14 DIAGNOSIS — I890 Lymphedema, not elsewhere classified: Secondary | ICD-10-CM

## 2023-01-14 DIAGNOSIS — R531 Weakness: Secondary | ICD-10-CM

## 2023-01-14 DIAGNOSIS — M1990 Unspecified osteoarthritis, unspecified site: Secondary | ICD-10-CM

## 2023-01-14 DIAGNOSIS — E538 Deficiency of other specified B group vitamins: Secondary | ICD-10-CM

## 2023-01-14 HISTORY — DX: Long term (current) use of aspirin: Z79.82

## 2023-01-14 HISTORY — DX: Restless legs syndrome: G25.81

## 2023-01-14 HISTORY — DX: Cataract extraction status, left eye: Z98.42

## 2023-01-14 HISTORY — DX: Nontoxic single thyroid nodule: E04.1

## 2023-01-14 HISTORY — DX: Chronic kidney disease, stage 3 unspecified: N18.30

## 2023-01-14 HISTORY — DX: Other intervertebral disc degeneration, lumbar region without mention of lumbar back pain or lower extremity pain: M51.369

## 2023-01-14 HISTORY — DX: Type 2 diabetes mellitus without complications: E11.9

## 2023-01-14 HISTORY — DX: Disorders of diaphragm: J98.6

## 2023-01-14 HISTORY — DX: Lymphedema, not elsewhere classified: I89.0

## 2023-01-14 HISTORY — PX: KNEE ARTHROPLASTY: SHX992

## 2023-01-14 HISTORY — DX: Other specified health status: Z78.9

## 2023-01-14 HISTORY — DX: Cataract extraction status, left eye: Z98.41

## 2023-01-14 LAB — GLUCOSE, CAPILLARY
Glucose-Capillary: 98 mg/dL (ref 70–99)
Glucose-Capillary: 176 mg/dL — ABNORMAL HIGH (ref 70–99)
Glucose-Capillary: 232 mg/dL — ABNORMAL HIGH (ref 70–99)
Glucose-Capillary: 205 mg/dL — ABNORMAL HIGH (ref 70–99)

## 2023-01-14 SURGERY — ARTHROPLASTY, KNEE, TOTAL, USING IMAGELESS COMPUTER-ASSISTED NAVIGATION
Anesthesia: General | Site: Knee | Laterality: Right

## 2023-01-14 MED ORDER — ACETAMINOPHEN 10 MG/ML IV SOLN: 1000.0000 mg | Freq: Once | INTRAVENOUS | Status: DC | PRN

## 2023-01-14 MED ORDER — CELECOXIB 200 MG PO CAPS
ORAL_CAPSULE | ORAL | Status: AC
  Filled 2023-01-14: qty 2

## 2023-01-14 MED ORDER — OXYCODONE HCL 5 MG PO TABS
10.0000 mg | ORAL_TABLET | ORAL | Status: DC | PRN
  Administered 2023-01-14: 10 mg via ORAL

## 2023-01-14 MED ORDER — FENTANYL CITRATE (PF) 100 MCG/2ML IJ SOLN: 25.0000 ug | INTRAMUSCULAR | Status: DC | PRN

## 2023-01-14 MED ORDER — HYDROMORPHONE HCL 1 MG/ML IJ SOLN
0.5000 mg | INTRAMUSCULAR | Status: DC | PRN
Start: 2023-01-14 — End: 2023-01-15

## 2023-01-14 MED ORDER — ACETAMINOPHEN 10 MG/ML IV SOLN
1000.0000 mg | Freq: Four times a day (QID) | INTRAVENOUS | Status: DC
  Administered 2023-01-14 – 2023-01-15 (×3): 1000 mg via INTRAVENOUS

## 2023-01-14 MED ORDER — CELECOXIB 200 MG PO CAPS
400.0000 mg | ORAL_CAPSULE | Freq: Once | ORAL | Status: AC
  Administered 2023-01-14: 400 mg via ORAL

## 2023-01-14 MED ORDER — DEXAMETHASONE SODIUM PHOSPHATE 10 MG/ML IJ SOLN
8.0000 mg | Freq: Once | INTRAMUSCULAR | Status: AC
  Administered 2023-01-14: 8 mg via INTRAVENOUS

## 2023-01-14 MED ORDER — CEFAZOLIN SODIUM-DEXTROSE 2-4 GM/100ML-% IV SOLN
2.0000 g | INTRAVENOUS | Status: AC
  Administered 2023-01-14: 2 g via INTRAVENOUS

## 2023-01-14 MED ORDER — BACITRACIN ZINC 500 UNIT/GM EX OINT
TOPICAL_OINTMENT | CUTANEOUS | Status: DC | PRN
Start: 2023-01-14 — End: 2023-01-14
  Administered 2023-01-14: 1 via TOPICAL

## 2023-01-14 MED ORDER — ONDANSETRON HCL 4 MG/2ML IJ SOLN
INTRAMUSCULAR | Status: DC | PRN
  Administered 2023-01-14: 4 mg via INTRAVENOUS

## 2023-01-14 MED ORDER — INSULIN GLARGINE-YFGN 100 UNIT/ML ~~LOC~~ SOLN
22.0000 [IU] | Freq: Every morning | SUBCUTANEOUS | Status: DC
  Administered 2023-01-15: 22 [IU] via SUBCUTANEOUS
  Filled 2023-01-14: qty 0.22

## 2023-01-14 MED ORDER — PHENOL 1.4 % MT LIQD: 1.0000 | OROMUCOSAL | Status: DC | PRN

## 2023-01-14 MED ORDER — DOXYCYCLINE HYCLATE 20 MG PO TABS: 20.0000 mg | ORAL_TABLET | Freq: Two times a day (BID) | ORAL | Status: DC

## 2023-01-14 MED ORDER — OXYCODONE HCL 5 MG PO TABS
ORAL_TABLET | ORAL | Status: AC
  Filled 2023-01-14: qty 1

## 2023-01-14 MED ORDER — SENNOSIDES-DOCUSATE SODIUM 8.6-50 MG PO TABS
ORAL_TABLET | ORAL | Status: AC
  Filled 2023-01-14: qty 1

## 2023-01-14 MED ORDER — POTASSIUM CHLORIDE CRYS ER 10 MEQ PO TBCR
20.0000 meq | EXTENDED_RELEASE_TABLET | Freq: Every day | ORAL | Status: DC
  Filled 2023-01-14: qty 2

## 2023-01-14 MED ORDER — OXYCODONE HCL 5 MG/5ML PO SOLN
5.0000 mg | Freq: Once | ORAL | Status: DC | PRN
Start: 2023-01-14 — End: 2023-01-14

## 2023-01-14 MED ORDER — TRANEXAMIC ACID-NACL 1000-0.7 MG/100ML-% IV SOLN
1000.0000 mg | INTRAVENOUS | Status: AC
  Administered 2023-01-14: 1000 mg via INTRAVENOUS

## 2023-01-14 MED ORDER — BACITRACIN ZINC 500 UNIT/GM EX OINT
TOPICAL_OINTMENT | CUTANEOUS | Status: AC
  Filled 2023-01-14: qty 28.35

## 2023-01-14 MED ORDER — TORSEMIDE 20 MG PO TABS
40.0000 mg | ORAL_TABLET | Freq: Every day | ORAL | Status: DC
  Filled 2023-01-14: qty 2

## 2023-01-14 MED ORDER — OXYCODONE HCL 5 MG PO TABS
5.0000 mg | ORAL_TABLET | ORAL | Status: DC | PRN
  Administered 2023-01-14 – 2023-01-15 (×4): 5 mg via ORAL

## 2023-01-14 MED ORDER — DEXAMETHASONE SODIUM PHOSPHATE 10 MG/ML IJ SOLN
INTRAMUSCULAR | Status: AC
  Filled 2023-01-14: qty 1

## 2023-01-14 MED ORDER — PROPOFOL 10 MG/ML IV BOLUS
INTRAVENOUS | Status: DC | PRN
  Administered 2023-01-14: 120 mg via INTRAVENOUS

## 2023-01-14 MED ORDER — FENTANYL CITRATE (PF) 100 MCG/2ML IJ SOLN
INTRAMUSCULAR | Status: DC | PRN
  Administered 2023-01-14: 50 ug via INTRAVENOUS

## 2023-01-14 MED ORDER — PHENYLEPHRINE HCL-NACL 20-0.9 MG/250ML-% IV SOLN
INTRAVENOUS | Status: AC
  Filled 2023-01-14: qty 250

## 2023-01-14 MED ORDER — ACETAMINOPHEN 10 MG/ML IV SOLN
INTRAVENOUS | Status: AC
  Filled 2023-01-14: qty 100

## 2023-01-14 MED ORDER — KETAMINE HCL 50 MG/5ML IJ SOSY
PREFILLED_SYRINGE | INTRAMUSCULAR | Status: AC
  Filled 2023-01-14: qty 5

## 2023-01-14 MED ORDER — BUPIVACAINE HCL (PF) 0.25 % IJ SOLN
INTRAMUSCULAR | Status: DC | PRN
  Administered 2023-01-14: 60 mL

## 2023-01-14 MED ORDER — LIDOCAINE HCL (CARDIAC) PF 100 MG/5ML IV SOSY
PREFILLED_SYRINGE | INTRAVENOUS | Status: DC | PRN
  Administered 2023-01-14: 100 mg via INTRAVENOUS

## 2023-01-14 MED ORDER — FERROUS SULFATE 325 (65 FE) MG PO TABS
325.0000 mg | ORAL_TABLET | Freq: Two times a day (BID) | ORAL | Status: DC
  Administered 2023-01-14 – 2023-01-15 (×2): 325 mg via ORAL

## 2023-01-14 MED ORDER — TRANEXAMIC ACID-NACL 1000-0.7 MG/100ML-% IV SOLN
1000.0000 mg | Freq: Once | INTRAVENOUS | Status: AC
  Administered 2023-01-14: 1000 mg via INTRAVENOUS

## 2023-01-14 MED ORDER — ONDANSETRON HCL 4 MG/2ML IJ SOLN
INTRAMUSCULAR | Status: AC
  Filled 2023-01-14: qty 2

## 2023-01-14 MED ORDER — TRANEXAMIC ACID-NACL 1000-0.7 MG/100ML-% IV SOLN
INTRAVENOUS | Status: AC
  Filled 2023-01-14: qty 100

## 2023-01-14 MED ORDER — SURGIRINSE WOUND IRRIGATION SYSTEM - OPTIME
TOPICAL | Status: DC | PRN
  Administered 2023-01-14: 450 mL

## 2023-01-14 MED ORDER — ALBUTEROL SULFATE (2.5 MG/3ML) 0.083% IN NEBU: 2.5000 mg | INHALATION_SOLUTION | RESPIRATORY_TRACT | Status: DC | PRN

## 2023-01-14 MED ORDER — CEFAZOLIN SODIUM-DEXTROSE 2-4 GM/100ML-% IV SOLN
2.0000 g | Freq: Four times a day (QID) | INTRAVENOUS | Status: AC
  Administered 2023-01-14 (×2): 2 g via INTRAVENOUS

## 2023-01-14 MED ORDER — FENTANYL CITRATE (PF) 100 MCG/2ML IJ SOLN
INTRAMUSCULAR | Status: AC
  Filled 2023-01-14: qty 2

## 2023-01-14 MED ORDER — PHENYLEPHRINE HCL-NACL 20-0.9 MG/250ML-% IV SOLN
INTRAVENOUS | Status: DC | PRN
Start: 2023-01-14 — End: 2023-01-14
  Administered 2023-01-14 (×3): 160 ug via INTRAVENOUS
  Administered 2023-01-14: 40 ug/min via INTRAVENOUS
  Administered 2023-01-14 (×2): 160 ug via INTRAVENOUS

## 2023-01-14 MED ORDER — MIDAZOLAM HCL 2 MG/2ML IJ SOLN
INTRAMUSCULAR | Status: AC
  Filled 2023-01-14: qty 2

## 2023-01-14 MED ORDER — INSULIN ASPART 100 UNIT/ML IJ SOLN: 10.0000 [IU] | Freq: Every day | INTRAMUSCULAR | Status: DC | PRN

## 2023-01-14 MED ORDER — GABAPENTIN 300 MG PO CAPS: 300.0000 mg | ORAL_CAPSULE | Freq: Once | ORAL | Status: DC

## 2023-01-14 MED ORDER — BISACODYL 10 MG RE SUPP: 10.0000 mg | Freq: Every day | RECTAL | Status: DC | PRN

## 2023-01-14 MED ORDER — SUGAMMADEX SODIUM 200 MG/2ML IV SOLN
INTRAVENOUS | Status: DC | PRN
  Administered 2023-01-14: 400 mg via INTRAVENOUS

## 2023-01-14 MED ORDER — CELECOXIB 200 MG PO CAPS
ORAL_CAPSULE | ORAL | Status: AC
  Filled 2023-01-14: qty 1

## 2023-01-14 MED ORDER — INSULIN ASPART 100 UNIT/ML IJ SOLN
0.0000 [IU] | Freq: Every day | INTRAMUSCULAR | Status: DC
  Administered 2023-01-14: 2 [IU] via SUBCUTANEOUS

## 2023-01-14 MED ORDER — CHLORHEXIDINE GLUCONATE 0.12 % MT SOLN
OROMUCOSAL | Status: AC
  Filled 2023-01-14: qty 15

## 2023-01-14 MED ORDER — METOCLOPRAMIDE HCL 10 MG PO TABS
ORAL_TABLET | ORAL | Status: AC
  Filled 2023-01-14: qty 1

## 2023-01-14 MED ORDER — INSULIN ASPART 100 UNIT/ML IJ SOLN
INTRAMUSCULAR | Status: AC
Start: 2023-01-14 — End: ?
  Filled 2023-01-14: qty 1

## 2023-01-14 MED ORDER — GABAPENTIN 300 MG PO CAPS
ORAL_CAPSULE | ORAL | Status: AC
  Filled 2023-01-14: qty 1

## 2023-01-14 MED ORDER — CHLORHEXIDINE GLUCONATE 0.12 % MT SOLN
15.0000 mL | Freq: Once | OROMUCOSAL | Status: AC
Start: 2023-01-14 — End: 2023-01-14
  Administered 2023-01-14: 15 mL via OROMUCOSAL

## 2023-01-14 MED ORDER — METOCLOPRAMIDE HCL 10 MG PO TABS
10.0000 mg | ORAL_TABLET | Freq: Three times a day (TID) | ORAL | Status: DC
  Administered 2023-01-14 – 2023-01-15 (×4): 10 mg via ORAL

## 2023-01-14 MED ORDER — ONDANSETRON HCL 4 MG/2ML IJ SOLN: 4.0000 mg | Freq: Once | INTRAMUSCULAR | Status: DC | PRN

## 2023-01-14 MED ORDER — CEFAZOLIN SODIUM-DEXTROSE 2-4 GM/100ML-% IV SOLN
INTRAVENOUS | Status: AC
  Filled 2023-01-14: qty 100

## 2023-01-14 MED ORDER — ROPINIROLE HCL 1 MG PO TABS
5.0000 mg | ORAL_TABLET | Freq: Every evening | ORAL | Status: DC
  Administered 2023-01-14: 5 mg via ORAL
  Filled 2023-01-14 (×2): qty 5

## 2023-01-14 MED ORDER — PANTOPRAZOLE SODIUM 40 MG PO TBEC
DELAYED_RELEASE_TABLET | ORAL | Status: AC
Start: 2023-01-14 — End: ?
  Filled 2023-01-14: qty 1

## 2023-01-14 MED ORDER — KETAMINE HCL 50 MG/5ML IJ SOSY
PREFILLED_SYRINGE | INTRAMUSCULAR | Status: DC | PRN
  Administered 2023-01-14: 10 mg via INTRAVENOUS
  Administered 2023-01-14: 40 mg via INTRAVENOUS

## 2023-01-14 MED ORDER — FERROUS SULFATE 325 (65 FE) MG PO TABS
ORAL_TABLET | ORAL | Status: AC
  Filled 2023-01-14: qty 1

## 2023-01-14 MED ORDER — GABAPENTIN 300 MG PO CAPS
300.0000 mg | ORAL_CAPSULE | Freq: Two times a day (BID) | ORAL | Status: DC
  Administered 2023-01-14 – 2023-01-15 (×2): 300 mg via ORAL

## 2023-01-14 MED ORDER — ASPIRIN 81 MG PO CHEW
CHEWABLE_TABLET | ORAL | Status: AC
  Filled 2023-01-14: qty 1

## 2023-01-14 MED ORDER — BUPIVACAINE HCL (PF) 0.5 % IJ SOLN
INTRAMUSCULAR | Status: AC
  Filled 2023-01-14: qty 10

## 2023-01-14 MED ORDER — CELECOXIB 200 MG PO CAPS
200.0000 mg | ORAL_CAPSULE | Freq: Two times a day (BID) | ORAL | Status: DC
Start: 2023-01-14 — End: 2023-01-15
  Administered 2023-01-14 – 2023-01-15 (×2): 200 mg via ORAL

## 2023-01-14 MED ORDER — ACETAMINOPHEN 325 MG PO TABS: 325.0000 mg | ORAL_TABLET | Freq: Four times a day (QID) | ORAL | Status: DC | PRN

## 2023-01-14 MED ORDER — INSULIN ASPART 100 UNIT/ML IJ SOLN
0.0000 [IU] | Freq: Three times a day (TID) | INTRAMUSCULAR | Status: DC
  Administered 2023-01-14: 5 [IU] via SUBCUTANEOUS
  Administered 2023-01-15: 2 [IU] via SUBCUTANEOUS

## 2023-01-14 MED ORDER — CRISABOROLE 2 % EX OINT
1.0000 | TOPICAL_OINTMENT | CUTANEOUS | Status: DC
Start: 2023-01-14 — End: 2023-01-15

## 2023-01-14 MED ORDER — CHLORHEXIDINE GLUCONATE 4 % EX SOLN: 60.0000 mL | Freq: Once | CUTANEOUS | Status: DC

## 2023-01-14 MED ORDER — ACETAMINOPHEN 10 MG/ML IV SOLN
INTRAVENOUS | Status: DC | PRN
  Administered 2023-01-14: 1000 mg via INTRAVENOUS

## 2023-01-14 MED ORDER — ROCURONIUM BROMIDE 100 MG/10ML IV SOLN
INTRAVENOUS | Status: DC | PRN
  Administered 2023-01-14: 70 mg via INTRAVENOUS

## 2023-01-14 MED ORDER — SODIUM CHLORIDE 0.9 % IV SOLN
INTRAVENOUS | Status: DC
Start: 2023-01-14 — End: 2023-01-14

## 2023-01-14 MED ORDER — TRAMADOL HCL 50 MG PO TABS: 50.0000 mg | ORAL_TABLET | ORAL | Status: DC | PRN

## 2023-01-14 MED ORDER — METOLAZONE 2.5 MG PO TABS: 2.5000 mg | ORAL_TABLET | Freq: Every day | ORAL | Status: DC | PRN

## 2023-01-14 MED ORDER — SODIUM CHLORIDE 0.9 % IV SOLN: INTRAVENOUS | Status: DC

## 2023-01-14 MED ORDER — SODIUM CHLORIDE 0.9 % IV SOLN
INTRAVENOUS | Status: DC | PRN
  Administered 2023-01-14: 60 mL

## 2023-01-14 MED ORDER — OXYCODONE HCL 5 MG PO TABS: 5.0000 mg | ORAL_TABLET | Freq: Once | ORAL | Status: DC | PRN

## 2023-01-14 MED ORDER — HYDROMORPHONE HCL 1 MG/ML IJ SOLN
INTRAMUSCULAR | Status: DC | PRN
  Administered 2023-01-14: 1 mg via INTRAVENOUS

## 2023-01-14 MED ORDER — ONDANSETRON HCL 4 MG PO TABS: 4.0000 mg | ORAL_TABLET | Freq: Four times a day (QID) | ORAL | Status: DC | PRN

## 2023-01-14 MED ORDER — SODIUM CHLORIDE 0.9 % IR SOLN
Status: DC | PRN
  Administered 2023-01-14: 3000 mL

## 2023-01-14 MED ORDER — INSULIN ASPART 100 UNIT/ML IJ SOLN
INTRAMUSCULAR | Status: AC
  Filled 2023-01-14: qty 1

## 2023-01-14 MED ORDER — ORAL CARE MOUTH RINSE: 15.0000 mL | Freq: Once | OROMUCOSAL | Status: AC

## 2023-01-14 MED ORDER — FLEET ENEMA RE ENEM: 1.0000 | ENEMA | Freq: Once | RECTAL | Status: DC | PRN

## 2023-01-14 MED ORDER — ONDANSETRON HCL 4 MG/2ML IJ SOLN: 4.0000 mg | Freq: Four times a day (QID) | INTRAMUSCULAR | Status: DC | PRN

## 2023-01-14 MED ORDER — TEMAZEPAM 15 MG PO CAPS
30.0000 mg | ORAL_CAPSULE | Freq: Every day | ORAL | Status: DC
  Administered 2023-01-14: 30 mg via ORAL
  Filled 2023-01-14: qty 2

## 2023-01-14 MED ORDER — TRAMADOL HCL 50 MG PO TABS
ORAL_TABLET | ORAL | Status: AC
  Filled 2023-01-14: qty 1

## 2023-01-14 MED ORDER — HYDROMORPHONE HCL 1 MG/ML IJ SOLN
INTRAMUSCULAR | Status: AC
  Filled 2023-01-14: qty 1

## 2023-01-14 MED ORDER — PANTOPRAZOLE SODIUM 40 MG PO TBEC
40.0000 mg | DELAYED_RELEASE_TABLET | Freq: Two times a day (BID) | ORAL | Status: DC
  Administered 2023-01-14 – 2023-01-15 (×2): 40 mg via ORAL

## 2023-01-14 MED ORDER — ATENOLOL 25 MG PO TABS
12.5000 mg | ORAL_TABLET | Freq: Two times a day (BID) | ORAL | Status: DC
  Administered 2023-01-14 – 2023-01-15 (×2): 12.5 mg via ORAL
  Filled 2023-01-14 (×2): qty 0.5

## 2023-01-14 MED ORDER — EMPAGLIFLOZIN 10 MG PO TABS
10.0000 mg | ORAL_TABLET | Freq: Every day | ORAL | Status: DC
  Administered 2023-01-15: 10 mg via ORAL
  Filled 2023-01-14: qty 1

## 2023-01-14 MED ORDER — MENTHOL 3 MG MT LOZG: 1.0000 | LOZENGE | OROMUCOSAL | Status: DC | PRN

## 2023-01-14 MED ORDER — MOMETASONE FUROATE 0.1 % EX CREA: 1.0000 | TOPICAL_CREAM | Freq: Every day | CUTANEOUS | Status: DC | PRN

## 2023-01-14 MED ORDER — MAGNESIUM HYDROXIDE 400 MG/5ML PO SUSP: 30.0000 mL | Freq: Every day | ORAL | Status: DC

## 2023-01-14 MED ORDER — PROPOFOL 1000 MG/100ML IV EMUL
INTRAVENOUS | Status: AC
  Filled 2023-01-14: qty 100

## 2023-01-14 MED ORDER — DIPHENHYDRAMINE HCL 12.5 MG/5ML PO ELIX: 12.5000 mg | ORAL_SOLUTION | ORAL | Status: DC | PRN

## 2023-01-14 MED ORDER — ASPIRIN 81 MG PO CHEW
81.0000 mg | CHEWABLE_TABLET | Freq: Two times a day (BID) | ORAL | Status: DC
  Administered 2023-01-14 – 2023-01-15 (×2): 81 mg via ORAL

## 2023-01-14 MED ORDER — LEVOTHYROXINE SODIUM 75 MCG PO TABS
75.0000 ug | ORAL_TABLET | Freq: Every day | ORAL | Status: DC
  Administered 2023-01-15: 75 ug via ORAL
  Filled 2023-01-14: qty 1

## 2023-01-14 MED ORDER — PROPOFOL 10 MG/ML IV BOLUS
INTRAVENOUS | Status: AC
  Filled 2023-01-14: qty 20

## 2023-01-14 MED ORDER — NITROGLYCERIN 0.4 MG SL SUBL: 0.4000 mg | SUBLINGUAL_TABLET | SUBLINGUAL | Status: DC | PRN

## 2023-01-14 MED ORDER — ALUM & MAG HYDROXIDE-SIMETH 200-200-20 MG/5ML PO SUSP: 30.0000 mL | ORAL | Status: DC | PRN

## 2023-01-14 MED ORDER — RANOLAZINE ER 500 MG PO TB12
500.0000 mg | ORAL_TABLET | Freq: Two times a day (BID) | ORAL | Status: DC
  Administered 2023-01-14 – 2023-01-15 (×2): 500 mg via ORAL
  Filled 2023-01-14 (×2): qty 1

## 2023-01-14 MED ORDER — SENNOSIDES-DOCUSATE SODIUM 8.6-50 MG PO TABS
1.0000 | ORAL_TABLET | Freq: Two times a day (BID) | ORAL | Status: DC
  Administered 2023-01-15: 1 via ORAL

## 2023-01-14 SURGICAL SUPPLY — 65 items
ATTUNE MED DOME PAT 32 KNEE (Knees) IMPLANT
ATTUNE PS FEM RT SZ 3 CEM KNEE (Femur) IMPLANT
ATTUNE PSRP INSR SZ3 6 KNEE (Insert) IMPLANT
BASEPLATE TIBIAL ROTATING SZ 4 (Knees) IMPLANT
BATTERY INSTRU NAVIGATION (MISCELLANEOUS) ×4 IMPLANT
BIT DRILL QUICK REL 1/8 2PK SL (BIT) ×1 IMPLANT
BLADE CLIPPER SURG (BLADE) IMPLANT
BLADE SAW 70X12.5 (BLADE) ×1 IMPLANT
BLADE SAW 90X13X1.19 OSCILLAT (BLADE) ×1 IMPLANT
BLADE SAW 90X25X1.19 OSCILLAT (BLADE) ×1 IMPLANT
BONE CEMENT GENTAMICIN (Cement) ×2 IMPLANT
BRUSH SCRUB EZ PLAIN DRY (MISCELLANEOUS) ×1 IMPLANT
CEMENT BONE GENTAMICIN 40 (Cement) IMPLANT
COOLER POLAR GLACIER W/PUMP (MISCELLANEOUS) ×1 IMPLANT
CUFF TRNQT CYL 30X4X21-28X (TOURNIQUET CUFF) IMPLANT
DRSG AQUACEL AG ADV 3.5X14 (GAUZE/BANDAGES/DRESSINGS) ×1 IMPLANT
DRSG MEPILEX FLEX 3X3 (GAUZE/BANDAGES/DRESSINGS) IMPLANT
DRSG MEPILEX SACRM 8.7X9.8 (GAUZE/BANDAGES/DRESSINGS) ×1 IMPLANT
DRSG TEGADERM 4X4.75 (GAUZE/BANDAGES/DRESSINGS) ×1 IMPLANT
DRSG XEROFORM 1X8 (GAUZE/BANDAGES/DRESSINGS) IMPLANT
DURAPREP 26ML APPLICATOR (WOUND CARE) ×2 IMPLANT
ELECT CAUTERY BLADE 6.4 (BLADE) ×1 IMPLANT
ELECT REM PT RETURN 9FT ADLT (ELECTROSURGICAL) ×1
ELECTRODE REM PT RTRN 9FT ADLT (ELECTROSURGICAL) ×1 IMPLANT
EVACUATOR 1/8 PVC DRAIN (DRAIN) ×1 IMPLANT
EX-PIN ORTHOLOCK NAV 4X150 (PIN) ×2 IMPLANT
GAUZE XEROFORM 1X8 LF (GAUZE/BANDAGES/DRESSINGS) ×1 IMPLANT
GLOVE BIOGEL M STRL SZ7.5 (GLOVE) ×6 IMPLANT
GLOVE SURG UNDER POLY LF SZ8 (GLOVE) ×2 IMPLANT
GOWN STRL REUS W/ TWL LRG LVL3 (GOWN DISPOSABLE) ×1 IMPLANT
GOWN STRL REUS W/ TWL XL LVL3 (GOWN DISPOSABLE) ×1 IMPLANT
GOWN TOGA ZIPPER T7+ PEEL AWAY (MISCELLANEOUS) ×1 IMPLANT
HOLDER FOLEY CATH W/STRAP (MISCELLANEOUS) ×1 IMPLANT
HOOD PEEL AWAY T7 (MISCELLANEOUS) ×1 IMPLANT
IV NS IRRIG 3000ML ARTHROMATIC (IV SOLUTION) ×1 IMPLANT
KIT TURNOVER KIT A (KITS) ×1 IMPLANT
KNIFE SCULPS 14X20 (INSTRUMENTS) ×1 IMPLANT
MANIFOLD NEPTUNE II (INSTRUMENTS) ×2 IMPLANT
NDL SPNL 20GX3.5 QUINCKE YW (NEEDLE) ×2 IMPLANT
NEEDLE SPNL 20GX3.5 QUINCKE YW (NEEDLE) ×2 IMPLANT
PACK TOTAL KNEE (MISCELLANEOUS) ×1 IMPLANT
PAD ABD DERMACEA PRESS 5X9 (GAUZE/BANDAGES/DRESSINGS) ×2 IMPLANT
PAD ARMBOARD 7.5X6 YLW CONV (MISCELLANEOUS) ×3 IMPLANT
PAD WRAPON POLAR KNEE (MISCELLANEOUS) ×1 IMPLANT
PENCIL SMOKE EVACUATOR COATED (MISCELLANEOUS) ×1 IMPLANT
PIN DRILL FIX HALF THREAD (BIT) ×2 IMPLANT
PIN FIXATION 1/8DIA X 3INL (PIN) ×1 IMPLANT
PULSAVAC PLUS IRRIG FAN TIP (DISPOSABLE) ×1
SOLUTION IRRIG SURGIPHOR (IV SOLUTION) ×1 IMPLANT
SPONGE DRAIN TRACH 4X4 STRL 2S (GAUZE/BANDAGES/DRESSINGS) ×1 IMPLANT
STAPLER SKIN PROX 35W (STAPLE) ×1 IMPLANT
STOCKINETTE STRL BIAS CUT 8X4 (MISCELLANEOUS) ×1 IMPLANT
STRAP TIBIA SHORT (MISCELLANEOUS) ×1 IMPLANT
SUCTION TUBE FRAZIER 10FR DISP (SUCTIONS) ×1 IMPLANT
SUT VIC AB 0 CT1 36 (SUTURE) ×1 IMPLANT
SUT VIC AB 1 CT1 36 (SUTURE) ×2 IMPLANT
SUT VIC AB 2-0 CT2 27 (SUTURE) ×1 IMPLANT
SYR 30ML LL (SYRINGE) ×2 IMPLANT
TIP FAN IRRIG PULSAVAC PLUS (DISPOSABLE) ×1 IMPLANT
TOWEL OR 17X26 4PK STRL BLUE (TOWEL DISPOSABLE) ×1 IMPLANT
TOWER CARTRIDGE SMART MIX (DISPOSABLE) ×1 IMPLANT
TRAP FLUID SMOKE EVACUATOR (MISCELLANEOUS) ×1 IMPLANT
TRAY FOLEY MTR SLVR 16FR STAT (SET/KITS/TRAYS/PACK) ×1 IMPLANT
WATER STERILE IRR 1000ML POUR (IV SOLUTION) ×1 IMPLANT
WRAPON POLAR PAD KNEE (MISCELLANEOUS) ×1

## 2023-01-14 NOTE — Evaluation (Signed)
Physical Therapy Evaluation Patient Details Name: [name] MRN: [id number] DOB: 03-19-1945 Today's Date: 01/14/2023  History of Present Illness  Pt is 77 y/o admitted 01/14/23 for right total knee arthroplasty. Procedure dated 01/14/23.  Clinical Impression  Pt received in bed with family at bedside and agreed to PT session. Pt reported no pain however still experienced minimal numbness in her RLE. Pt performed bed mobility SUP, STS with the use of RW (2wheels) CGA, and amb ~171ft with RW CGA. Pt demonstrated/expressed understanding for WB precautions. VC necessary throughout session for RW management. Goniometric measurements read: Right knee flexion= 65 degrees and Right knee extension= 5 degrees. Pt tolerated Tx well and will continue to benefit from skilled PT sessions to improve strength, ROM, activity tolerance, and functional mobility to maximize safety/IND following D/C.        If plan is discharge home, recommend the following: A little help with walking and/or transfers;Help with stairs or ramp for entrance;Assist for transportation   Can travel by private vehicle        Equipment Recommendations Rolling walker (2 wheels)  Recommendations for Other Services       Functional Status Assessment Patient has had a recent decline in their functional status and demonstrates the ability to make significant improvements in function in a reasonable and predictable amount of time.     Precautions / Restrictions Precautions Precautions: Knee Precaution Booklet Issued: Yes (comment) Precaution Comments: PT TKR HEP and precautions packet and OT TKR handout provided. Restrictions Weight Bearing Restrictions: Yes RLE Weight Bearing: Weight bearing as tolerated      Mobility  Bed Mobility Overal bed mobility: Needs Assistance Bed Mobility: Supine to Sit     Supine to sit: Supervision     General bed mobility comments: Pt performed bed mobility SUP and reported minimal  lightheadedness while seated EOB.    Transfers Overall transfer level: Needs assistance Equipment used: Rolling walker (2 wheels) Transfers: Sit to/from Stand Sit to Stand: Min assist           General transfer comment: Pt performed STS with the use of RW (2wheels) MinA without reports of s/sx relative to dizziness. VC necessary for RW management.    Ambulation/Gait Ambulation/Gait assistance: Contact guard assist Gait Distance (Feet): 100 Feet Assistive device: Rolling walker (2 wheels) Gait Pattern/deviations: Step-to pattern, Antalgic Gait velocity: decreased     General Gait Details: Pt amb with the use of RW (2wheels) CGA. VC necessary for RW management.  Stairs            Wheelchair Mobility     Tilt Bed    Modified Rankin (Stroke Patients Only)       Balance Overall balance assessment: Needs assistance Sitting-balance support: Feet supported Sitting balance-Leahy Scale: Good     Standing balance support: Bilateral upper extremity supported, During functional activity, Reliant on assistive device for balance Standing balance-Leahy Scale: Fair                               Pertinent Vitals/Pain Pain Assessment Pain Assessment: No/denies pain    Home Living Family/patient expects to be discharged to:: Private residence Living Arrangements: Spouse/significant other;Other relatives Available Help at Discharge: Family;Available 24 hours/day Type of Home: House Home Access: Stairs to enter Entrance Stairs-Rails: None Entrance Stairs-Number of Steps: 1+1   Home Layout: One level Home Equipment: Cane - single point;Shower seat;Adaptive equipment;Rollator (4 wheels);Grab bars - tub/shower;BSC/3in1  Prior Function Prior Level of Function : Driving;Independent/Modified Independent             Mobility Comments: Pt reports IND prior to admisson. Using cane or rolling walker intermittently as needed ADLs Comments: Pt reports IND  prior to admission     Extremity/Trunk Assessment   Upper Extremity Assessment Upper Extremity Assessment: Overall WFL for tasks assessed    Lower Extremity Assessment Lower Extremity Assessment: RLE deficits/detail RLE Deficits / Details: Total knee replacement.       Communication   Communication Communication: No apparent difficulties Cueing Techniques: Verbal cues  Cognition Arousal: Alert Behavior During Therapy: WFL for tasks assessed/performed Overall Cognitive Status: Within Functional Limits for tasks assessed                                 General Comments: AOx4. Pt pleasant and willing to participate in PT session.        General Comments      Exercises Total Joint Exercises Goniometric ROM: Right knee flexion= 65 degrees. Right knee extension= 5 degrees.   Assessment/Plan    PT Assessment Patient needs continued PT services  PT Problem List Decreased strength;Decreased range of motion;Decreased activity tolerance;Decreased balance;Decreased mobility;Pain       PT Treatment Interventions DME instruction;Gait training;Stair training;Functional mobility training;Therapeutic activities;Therapeutic exercise    PT Goals (Current goals can be found in the Care Plan section)  Acute Rehab PT Goals Patient Stated Goal: To go home PT Goal Formulation: With patient Time For Goal Achievement: 01/28/23 Potential to Achieve Goals: Good    Frequency BID     Co-evaluation               AM-PAC PT "6 Clicks" Mobility  Outcome Measure Help needed turning from your back to your side while in a flat bed without using bedrails?: None Help needed moving from lying on your back to sitting on the side of a flat bed without using bedrails?: A Little Help needed moving to and from a bed to a chair (including a wheelchair)?: A Little Help needed standing up from a chair using your arms (e.g., wheelchair or bedside chair)?: A Little Help needed to  walk in hospital room?: A Little Help needed climbing 3-5 steps with a railing? : A Lot 6 Click Score: 18    End of Session Equipment Utilized During Treatment: Gait belt Activity Tolerance: Patient tolerated treatment well;Patient limited by pain Patient left: in chair;with call bell/phone within reach;with family/visitor present Nurse Communication: Mobility status PT Visit Diagnosis: Unsteadiness on feet (R26.81);Other abnormalities of gait and mobility (R26.89);Muscle weakness (generalized) (M62.81);Difficulty in walking, not elsewhere classified (R26.2);Pain Pain - Right/Left: Right Pain - part of body: Knee    Time: 1610-9604 PT Time Calculation (min) (ACUTE ONLY): 35 min   Charges:   PT Evaluation $PT Eval Low Complexity: 1 Low PT Treatments $Gait Training: 8-22 mins PT General Charges $$ ACUTE PT VISIT: 1 Visit         Vidur Knust Sauvignon Howard SPT, LAT, ATC  Hadasah Brugger Sauvignon-Howard 01/14/2023, 3:28 PM

## 2023-01-14 NOTE — Interval H&P Note (Signed)
History and Physical Interval Note:  01/14/2023 6:07 AM  [name]  has presented today for surgery, with the diagnosis of PRIMARY OSTEOARTHRITIS OF RIGHT KNEE..  The various methods of treatment have been discussed with the patient and family. After consideration of risks, benefits and other options for treatment, the patient has consented to  Procedure(s): COMPUTER ASSISTED TOTAL KNEE ARTHROPLASTY (Right) as a surgical intervention.  The patient's history has been reviewed, patient examined, no change in status, stable for surgery.  I have reviewed the patient's chart and labs.  Questions were answered to the patient's satisfaction.     Roby Spalla P [name]

## 2023-01-14 NOTE — Anesthesia Postprocedure Evaluation (Signed)
Anesthesia Post Note  Patient: [name]  Procedure(s) Performed: COMPUTER ASSISTED TOTAL KNEE ARTHROPLASTY (Right: Knee)  Patient location during evaluation: PACU Anesthesia Type: General Level of consciousness: awake and alert Pain management: pain level controlled Vital Signs Assessment: post-procedure vital signs reviewed and stable Respiratory status: spontaneous breathing, nonlabored ventilation, respiratory function stable and patient connected to nasal cannula oxygen Cardiovascular status: blood pressure returned to baseline and stable Postop Assessment: no apparent nausea or vomiting Anesthetic complications: no   No notable events documented.   Last Vitals:  Vitals:   01/14/23 1230 01/14/23 1304  BP: (!) 119/57 (!) 109/53  Pulse: 79 78  Resp: 17 16  Temp: 36.6 C 37.1 C  SpO2: 96% 99%    Last Pain:  Vitals:   01/14/23 1230  TempSrc:   PainSc: 4                  Corinda Gubler

## 2023-01-14 NOTE — Progress Notes (Signed)
Subjective: Day of Surgery Procedure(s) (LRB): COMPUTER ASSISTED TOTAL KNEE ARTHROPLASTY (Right) Patient reports pain as mild.   Patient seen in rounds with Dr. Ernest Pine. Patient is well, and has had no acute complaints or problems. Denies any CP, SOB, N/V, fevers or chills We will continue with therapy today.  Plan is to go Home after hospital stay.  Objective: Vital signs in last 24 hours: Temp:  [97.5 F (36.4 C)-98.7 F (37.1 C)] 98.2 F (36.8 C) (12/02 1531) Pulse Rate:  [73-92] 73 (12/02 1531) Resp:  [10-17] 17 (12/02 1531) BP: (109-144)/(50-82) 118/50 (12/02 1531) SpO2:  [92 %-99 %] 98 % (12/02 1531) Weight:  [88 kg] 88 kg (12/02 0645)  Intake/Output from previous day:  Intake/Output Summary (Last 24 hours) at 01/14/2023 1630 Last data filed at 01/14/2023 1230 Gross per 24 hour  Intake 400 ml  Output 205 ml  Net 195 ml    Intake/Output this shift: Total I/O In: 400 [I.V.:400] Out: 205 [Urine:125; Drains:30; Blood:50]  Labs: No results for input(s): "HGB" in the last 72 hours. No results for input(s): "WBC", "RBC", "HCT", "PLT" in the last 72 hours. No results for input(s): "NA", "K", "CL", "CO2", "BUN", "CREATININE", "GLUCOSE", "CALCIUM" in the last 72 hours. No results for input(s): "LABPT", "INR" in the last 72 hours.  EXAM General - Patient is Alert, Appropriate, and Oriented Extremity - Neurologically intact ABD soft Neurovascular intact Sensation intact distally Intact pulses distally Dorsiflexion/Plantar flexion intact No cellulitis present Compartment soft Dressing - dressing C/D/I and no drainage Motor Function - intact, moving foot and toes well on exam. Able to plantar and dorsiflex with good strength and ROM.  JP Drain pulled without difficulty. Intact  Past Medical History:  Diagnosis Date   Anemia    Anxiety    Aortic atherosclerosis (HCC)    Arthritis    CAD (coronary artery disease)    a.) LHC 06/11/2007: 30% D1, 30% pRCA - med mgmt; b.)  LHC 07/25/2011: 50% mLAD, 50% D1, 50% pRCA - med mgmt; c.) LHC 10/01/2017: 50% p-mLAD, 80% D1 - med mgmt; d.) R/LHC 12/26/2021: 50% p-mLAD, 80% oD1-D1. mRA 15, mPA 29, mPWCP 25, AO sat 94%, PA sat 69%, CO 6.0, CI 3.1, LVEDP 20   Chronic back pain    Chronic heart failure with preserved ejection fraction (HFpEF) (HCC)    a.) TTE 07/26/2017: EF 60-65%, no RWMAs, mild LVH, G1DD; b.) TTE 07/17/2017: EF 60-65%, no RWMAs, G2DD, norm RVSF; c.) TTE 09/08/2020: EF 60-65%, no RWMAs, G2DD, norm RVSF; d.) TTE 12/26/2021: EF 55-60%, mild LVH, G2DD, mild MR, AoV sclerosis without stenosis   CKD (chronic kidney disease), stage III (HCC)    Complication of anesthesia 2018   a.) aspirated with supraglottoic airway removal following wrist surgery   Constipation    DDD (degenerative disc disease), lumbar    a.) s/p BILATERAL laminectomies with decompression and fusion L2-S1; total of 2 surgeries (2017 and 2021) with subsequent development of associated post-operative infections each time   Depression    Diverticulosis    Dysplastic nevus 09/12/2017   L sup buttock - mild    Dysplastic nevus 09/18/2018   R sup med buttocks - mild    E coli infection    Elevated hemidiaphragm (RIGHT)    GERD (gastroesophageal reflux disease)    Headache    Hemorrhoids    History of 2019 novel coronavirus disease (COVID-19) 10/31/2022   History of bilateral cataract extraction    History of colon polyps  History of gout    History of hiatal hernia    History of loop recorder 10/17/2017   a.) LINQ ILR placed; b.) removed 03/04/2019   History of vertigo    Hyperlipidemia    Hypertension    Hypothyroidism    Insomnia    a.) on tamazepam qhs PRN   Left thyroid nodule    Long term current use of aspirin    Lymphedema    NSVT (nonsustained ventricular tachycardia) (HCC)    a.) in setting of acute respiratory failure secondary to AECOPD and CHF; aggressive diuresis + IV steroids led to NSVT + frequent PVCs --> Tx'd with  metoprolol (significant fatigue with higher doses)   OSA on CPAP    Paroxysmal atrial tachycardia (HCC)    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   a.) s/p placement of Biotronik [place] 8 DR-T DC PPM on 03/04/2019   Restless leg syndrome    a.) on ropinirole   Rosacea    Statin intolerance    Symptomatic bradycardia    a.) s/p placement of Biotronik [place] 8 DR-T DC PPM on 03/04/2019   Syncope    a.) documented bradycardia and pauses --> s/p Biotronik [place] 8 DR-T DC PPM placement on 03/04/2019   T2DM (type 2 diabetes mellitus) (HCC)    Varicose veins     Assessment/Plan: Day of Surgery Procedure(s) (LRB): COMPUTER ASSISTED TOTAL KNEE ARTHROPLASTY (Right) Principal Problem:   History of total knee arthroplasty, right  Estimated body mass index is 36.66 kg/m as calculated from the following:   Height as of this encounter: 5\' 1"  (1.549 m).   Weight as of this encounter: 88 kg. Advance diet Up with therapy  Patient will continue to work with physical therapy to pass postoperative PT protocols, ROM and strengthening  Discussed with the patient continuing to utilize Polar Care  Patient will use bone foam in 20-30 minute intervals  Patient will wear TED hose bilaterally to help prevent DVT and clot formation  Discussed the Aquacel bandage.  This bandage will stay in place 7 days postoperatively.  Can be replaced with honeycomb bandages that will be sent home with the patient  Discussed sending the patient home with tramadol and oxycodone for as needed pain management.  Patient will also be sent home with Celebrex to help with swelling and inflammation.  Patient will take an 81 mg aspirin twice daily for DVT prophylaxis  JP drain removed without difficulty, intact  Weight-Bearing as tolerated to right leg  Patient will follow-up with [facility] in 2 weeks for staple removal and reevaluation  Rayburn Go, PA-C [facility] 01/14/2023, 4:30 PM

## 2023-01-14 NOTE — Discharge Summary (Signed)
Physician Discharge Summary  Subjective: Day of Surgery Procedure(s) (LRB): COMPUTER ASSISTED TOTAL KNEE ARTHROPLASTY (Right) Patient reports pain as mild.   Patient seen in rounds with Dr. Ernest Pine. Patient is well, and has had no acute complaints or problems. Denies any CP, SOB, N/V, fevers or chills We will continue with therapy today.  Patient is ready to go home  Physician Discharge Summary  Patient ID: [name] MRN: [id number] DOB/AGE: 77-Oct-1947 77 y.o.  Admit date: 01/14/2023 Discharge date: 01/14/2023  Admission Diagnoses:  Discharge Diagnoses:  Principal Problem:   History of total knee arthroplasty, right   Discharged Condition: good  Hospital Course: Patient presented to the hospital on 01/14/2023 for an elective right total knee arthroplasty performed by Dr. Ernest Pine.  The patient was given 1 g of TXA and 2 g of Ancef perioperatively.  She tolerated the procedure well without any complications.  See procedural note below for details.  Postoperatively, the patient did very well.  She was able to pass her PT protocols postop day 1.  Her JP drain was removed without any difficulty, intact.  She denies any CP, SOB, N/B, fevers or chills.  She has voided her bladder.  Her vital signs are stable.  Patient is stable for discharge home.  PROCEDURE:  Right total knee arthroplasty using computer-assisted navigation   SURGEON:  Jena Gauss. M.D.   ASSISTANT:  Gean Birchwood, PA-C (present and scrubbed throughout the case, critical for assistance with exposure, retraction, instrumentation, and closure)   ANESTHESIA: general   ESTIMATED BLOOD LOSS: 50 mL   FLUIDS REPLACED: 400 mL of crystalloid   TOURNIQUET TIME: 78 minutes   DRAINS: 2 medium Hemovac drains   SOFT TISSUE RELEASES: Anterior cruciate ligament, posterior cruciate ligament, deep medial collateral ligament, patellofemoral ligament   IMPLANTS UTILIZED: DePuy Attune size 3 posterior stabilized femoral  component (cemented), size 4 rotating platform tibial component (cemented), 32 mm medialized dome patella (cemented), and a 6 mm stabilized rotating platform polyethylene insert.    Treatments: none  Discharge Exam: Blood pressure (!) 118/50, pulse 73, temperature 98.2 F (36.8 C), resp. rate 17, height 5\' 1"  (1.549 m), weight 88 kg, SpO2 98%.   Disposition: home   Allergies as of 01/14/2023       Reactions   Ezetimibe Diarrhea, Nausea Only   Hydrocodone-acetaminophen Other (See Comments)   Metoclopramide Diarrhea   Propoxyphene Other (See Comments)   Unsure of reaction type Unsure of reaction type Unsure of reaction type   Cefuroxime    Hydrocodone Other (See Comments)   Tramadol Hives   Ceftin [cefuroxime Axetil] Diarrhea   Codeine Rash       Penicillins Rash, Other (See Comments)   IgE = 22 (WNL) on 01/03/2023 Has patient had a PCN reaction causing immediate rash, facial/tongue/throat swelling, SOB or lightheadedness with hypotension: NO Has patient had a PCN reaction causing severe rash involving mucus membranes or skin necrosis: NO Has patient had a PCN retioion that required hospitalization NO Has patient had a PCN reaction occurring within the last 10 years: NO If all of the above answers are "NO", then may proceed with Cephalosporin use.   Statins Other (See Comments)   Leg pain Leg pain Other reaction(s): restless leg syndrome   Vicodin [hydrocodone-acetaminophen] Other (See Comments)   passes out     Med Rec must be completed prior to using this [facility]***        Durable Medical Equipment  (From admission, onward)  Start     Ordered   01/14/23 1047  DME Walker rolling  Once       Question:  Patient needs a walker to treat with the following condition  Answer:  Total knee replacement status   01/14/23 1046   01/14/23 1047  DME Bedside commode  Once       Comments: Patient is not able to walk the distance required to go the bathroom, or  he/she is unable to safely negotiate stairs required to access the bathroom.  A 3in1 BSC will alleviate this problem  Question:  Patient needs a bedside commode to treat with the following condition  Answer:  Total knee replacement status   01/14/23 1046            Follow-up Information     Rayburn Go, PA-C Follow up on 01/29/2023.   Specialty: Orthopedic Surgery Why: at 1:45pm Contact information: [address] [place] [address] [phone]         Donato Heinz, MD Follow up on 02/26/2023.   Specialty: Orthopedic Surgery Why: at 2:15pm Contact information: [address] [facility] [place] [address] [phone]                 Signed: Gean Birchwood 01/14/2023, 4:34 PM   Objective: Vital signs in last 24 hours: Temp:  [97.5 F (36.4 C)-98.7 F (37.1 C)] 98.2 F (36.8 C) (12/02 1531) Pulse Rate:  [73-92] 73 (12/02 1531) Resp:  [10-17] 17 (12/02 1531) BP: (109-144)/(50-82) 118/50 (12/02 1531) SpO2:  [92 %-99 %] 98 % (12/02 1531) Weight:  [88 kg] 88 kg (12/02 0645)  Intake/Output from previous day:  Intake/Output Summary (Last 24 hours) at 01/14/2023 1634 Last data filed at 01/14/2023 1230 Gross per 24 hour  Intake 400 ml  Output 205 ml  Net 195 ml    Intake/Output this shift: Total I/O In: 400 [I.V.:400] Out: 205 [Urine:125; Drains:30; Blood:50]  Labs: No results for input(s): "HGB" in the last 72 hours. No results for input(s): "WBC", "RBC", "HCT", "PLT" in the last 72 hours. No results for input(s): "NA", "K", "CL", "CO2", "BUN", "CREATININE", "GLUCOSE", "CALCIUM" in the last 72 hours. No results for input(s): "LABPT", "INR" in the last 72 hours.  EXAM: General - Patient is Alert, Appropriate, and Oriented Extremity - Neurologically intact ABD soft Neurovascular intact Sensation intact distally Intact pulses distally Dorsiflexion/Plantar flexion intact No cellulitis present Compartment  soft Dressing - dressing C/D/I and no drainage Motor Function - intact, moving foot and toes well on exam. Able to plantar and dorsiflex with good strength and ROM.  Neurovascularly intact all dermatomes down her right lower extremity.  Posterior tibial pulses appreciated. JP Drain pulled without difficulty. Intact  Assessment/Plan: Day of Surgery Procedure(s) (LRB): COMPUTER ASSISTED TOTAL KNEE ARTHROPLASTY (Right) Procedure(s) (LRB): COMPUTER ASSISTED TOTAL KNEE ARTHROPLASTY (Right) Past Medical History:  Diagnosis Date   Anemia    Anxiety    Aortic atherosclerosis (HCC)    Arthritis    CAD (coronary artery disease)    a.) LHC 06/11/2007: 30% D1, 30% pRCA - med mgmt; b.) LHC 07/25/2011: 50% mLAD, 50% D1, 50% pRCA - med mgmt; c.) LHC 10/01/2017: 50% p-mLAD, 80% D1 - med mgmt; d.) [facility] 12/26/2021: 50% p-mLAD, 80% oD1-D1. mRA 15, mPA 29, mPWCP 25, AO sat 94%, PA sat 69%, CO 6.0, CI 3.1, LVEDP 20   Chronic back pain    Chronic heart failure with preserved ejection fraction (HFpEF) (HCC)  a.) TTE 07/26/2017: EF 60-65%, no RWMAs, mild LVH, G1DD; b.) TTE 07/17/2017: EF 60-65%, no RWMAs, G2DD, norm RVSF; c.) TTE 09/08/2020: EF 60-65%, no RWMAs, G2DD, norm RVSF; d.) TTE 12/26/2021: EF 55-60%, mild LVH, G2DD, mild MR, AoV sclerosis without stenosis   CKD (chronic kidney disease), stage III (HCC)    Complication of anesthesia 2018   a.) aspirated with supraglottoic airway removal following wrist surgery   Constipation    DDD (degenerative disc disease), lumbar    a.) s/p BILATERAL laminectomies with decompression and fusion L2-S1; total of 2 surgeries (2017 and 2021) with subsequent development of associated post-operative infections each time   Depression    Diverticulosis    Dysplastic nevus 09/12/2017   L sup buttock - mild    Dysplastic nevus 09/18/2018   R sup med buttocks - mild    E coli infection    Elevated hemidiaphragm (RIGHT)    GERD (gastroesophageal reflux disease)     Headache    Hemorrhoids    History of 2019 novel coronavirus disease (COVID-19) 10/31/2022   History of bilateral cataract extraction    History of colon polyps    History of gout    History of hiatal hernia    History of loop recorder 10/17/2017   a.) LINQ ILR placed; b.) removed 03/04/2019   History of vertigo    Hyperlipidemia    Hypertension    Hypothyroidism    Insomnia    a.) on tamazepam qhs PRN   Left thyroid nodule    Long term current use of aspirin    Lymphedema    NSVT (nonsustained ventricular tachycardia) (HCC)    a.) in setting of acute respiratory failure secondary to AECOPD and CHF; aggressive diuresis + IV steroids led to NSVT + frequent PVCs --> Tx'd with metoprolol (significant fatigue with higher doses)   OSA on CPAP    Paroxysmal atrial tachycardia (HCC)    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   a.) s/p placement of Biotronik Edora 8 DR-T DC PPM on 03/04/2019   Restless leg syndrome    a.) on ropinirole   Rosacea    Statin intolerance    Symptomatic bradycardia    a.) s/p placement of Biotronik Edora 8 DR-T DC PPM on 03/04/2019   Syncope    a.) documented bradycardia and pauses --> s/p Biotronik Edora 8 DR-T DC PPM placement on 03/04/2019   T2DM (type 2 diabetes mellitus) (HCC)    Varicose veins    Principal Problem:   History of total knee arthroplasty, right  Estimated body mass index is 36.66 kg/m as calculated from the following:   Height as of this encounter: 5\' 1"  (1.549 m).   Weight as of this encounter: 88 kg.  Patient will continue to work with home health physical therapy on gait, ROM and strengthening   Discussed with the patient continuing to utilize [employer]   Patient will use bone foam in 20-30 minute intervals   Patient will wear TED hose bilaterally to help prevent DVT and clot formation   Discussed the Aquacel bandage.  This bandage will stay in place 7 days postoperatively.  Can be replaced with honeycomb  bandages that will be sent home with the patient   Discussed sending the patient home with tramadol and oxycodone for as needed pain management.  Patient will also be sent home with Celebrex to help with swelling and inflammation.  Patient will take an 81 mg aspirin twice daily for DVT  prophylaxis   JP drain removed without difficulty, intact   Weight-Bearing as tolerated to right leg   Patient will follow-up with [facility] clinic orthopedics in 2 weeks for staple removal and reevaluation  Diet - Diabetic diet Follow up - in 2 weeks Activity - WBAT Disposition - Home Condition Upon Discharge - Good DVT Prophylaxis - Aspirin and TED hose  Danise Edge, PA-C Orthopaedic Surgery 01/14/2023, 4:34 PM

## 2023-01-14 NOTE — Transfer of Care (Signed)
Immediate Anesthesia Transfer of Care Note  Patient: [name]  Procedure(s) Performed: COMPUTER ASSISTED TOTAL KNEE ARTHROPLASTY (Right: Knee)  Patient Location: PACU  Anesthesia Type:General  Level of Consciousness: awake and drowsy  Airway & Oxygen Therapy: Patient Spontanous Breathing and Patient connected to face mask oxygen  Post-op Assessment: Report given to RN and Post -op Vital signs reviewed and stable  Post vital signs: Reviewed and stable  Last Vitals:  Vitals Value Taken Time  BP 133/61 01/14/23 1100  Temp 36.8 C 01/14/23 1054  Pulse 89 01/14/23 1104  Resp 11 01/14/23 1104  SpO2 93 % 01/14/23 1104  Vitals shown include unfiled device data.  Last Pain:  Vitals:   01/14/23 1054  TempSrc:   PainSc: Asleep         Complications: No notable events documented.

## 2023-01-14 NOTE — Anesthesia Procedure Notes (Signed)
Procedure Name: Intubation Date/Time: 01/14/2023 7:24 AM  Performed by: Reece Agar, CRNAPre-anesthesia Checklist: Patient identified, Emergency Drugs available, Suction available and Patient being monitored Patient Re-evaluated:Patient Re-evaluated prior to induction Oxygen Delivery Method: Circle system utilized Preoxygenation: Pre-oxygenation with 100% oxygen Induction Type: IV induction Ventilation: Mask ventilation without difficulty Laryngoscope Size: McGrath and 3 Grade View: Grade II Tube type: Oral Tube size: 7.0 mm Number of attempts: 1 Airway Equipment and Method: Stylet Placement Confirmation: ETT inserted through vocal cords under direct vision, positive ETCO2 and breath sounds checked- equal and bilateral Secured at: 22 cm Tube secured with: Tape Dental Injury: Teeth and Oropharynx as per pre-operative assessment

## 2023-01-14 NOTE — Anesthesia Preprocedure Evaluation (Signed)
Anesthesia Evaluation  Patient identified by MRN, date of birth, ID band Patient awake  General Assessment Comment:  Hx aspiration pneumonia with an LMA general anesthetic in 2018.  Reviewed: Allergy & Precautions, NPO status , Patient's Chart, lab work & pertinent test results  History of Anesthesia Complications (+) history of anesthetic complications  Airway Mallampati: II  TM Distance: <3 FB Neck ROM: Full    Dental  (+) Teeth Intact   Pulmonary sleep apnea , neg COPD, Patient abstained from smoking.Not current smoker   Pulmonary exam normal breath sounds clear to auscultation       Cardiovascular Exercise Tolerance: Good METShypertension, + CAD  (-) Past MI (-) dysrhythmias + pacemaker  Rhythm:Regular Rate:Normal - Systolic murmurs    Patient has undergone multiple cardiac catheterizations in the past    Cardiac catheterization performed on 06/11/2007 revealing multivessel CAD; 30% D1 and 30% proximal RCA.  Given the nonobstructive degree of her coronary artery disease, the decision was made to defer intervention opting for medical management.    Repeat cardiac catheterization was performed on 07/25/2011 revealing multivessel CAD; 50% mid LAD, 50% D1, and 50% proximal RCA.  Again, given that coronary artery disease was nonobstructive, intervention was deferred opting for continued medical management.    Repeat diagnostic LEFT heart catheterization was performed on 10/01/2017 revealing multivessel CAD; 50% proximal to mid LAD and 80% ostial D1.  Cardiology noted that D1 lesion was not readily amenable to PCI given small size and ostial location.  Recommendations were for continued medical therapy.    Diagnostic RIGHT/LEFT heart catheterization performed on 12/26/2021 revealing multivessel CAD; 50% proximal and mid LAD and 80% ostial D1-D1.  Again, D1 lesion not felt to be amenable to PCI.  Intervention was deferred opting  for continued medical management.  Hemodynamics: mean RA = 15 mmHg, mean PA = 29 mmHg, mean PCWP = 25 mmHg, AO saturation 94%, PA saturation 69%, CO = 6.0 L/min, CI = 3.1 L/min/m, and LVEDP 20 mmHg.    Patient with a history of symptomatic bradycardia.  ILR was placed on 10/17/2017.  Loop recorder revealed prolonged bradycardia with episodes lasting longer than 30 seconds.  There were at least 13 seconds of asystole with occasional scattered PVCs.  Electrophysiology was consulted.      Linq device was removed and replaced with a Biotronik Edora 8 DR-T (SN: [id number])  permanent pacemaker on 03/04/2019.  Device is regularly interrogated by patient's primary electrophysiology team.  Last interrogation was on 11/28/2022, at which time device was noted to be functioning properly.    Myocardial perfusion imaging study was performed on 09/08/2020 revealing a normal left ventricular systolic function with hyperdynamic LVEF of 67%.  There was no evidence of stress-induced myocardial ischemia or arrhythmia; no scintigraphic evidence of scar.  CT attenuation correction images showed mild aortic atherosclerosis and mild coronary calcifications.  Overall, study was determined to be low risk.    Coronary CTA was performed on 08/10/2021 that demonstrated an Agatston coronary artery calcium score of 897. This placed patient in the 92nd percentile for age, sex, and race matched controls. Calcium depositions noted to be moderate in the LAD and D1 distributions (50-69%) and mild and the proximal RCA and mid LCx distributions (25-49%) study demonstrates normal coronary origin with LEFT dominance.  Study was sent for [facility] analysis (ranges: < 0.75 high likelihood of hemodynamically significant stenosis, 0.76-0.80 borderline, > 0.80 normal):    Left Main:  No significant stenosis  LAD: No significant stenosis.  FFRct 0.88  LCX: No significant stenosis.  FFRct 0.85  RCA: No significant stenosis.  FFRct 0.95    Most  recent TTE was performed on 12/26/2021 revealing a normal left ventricular systolic function with an EF of 55 to 60%.  There were no regional wall motion abnormalities.  Mild LVH observed. Left ventricular diastolic Doppler parameters consistent with pseudonormalization (G2DD).  Mitral valve was degenerative with mild associated valvular regurgitation.  Aortic valve mildly thickened/sclerotic. All transvalvular gradients were noted to be normal providing no evidence suggestive of valvular stenosis. Aorta normal in size with no evidence of aneurysmal dilatation.    Neuro/Psych  Headaches PSYCHIATRIC DISORDERS Anxiety Depression       GI/Hepatic hiatal hernia,GERD  Medicated,,(+)     (-) substance abuse    Endo/Other  diabetes, Type 2, Insulin DependentHypothyroidism  On GLP1 agonist ozempic, last taken 8 days ago. Denies GI symptoms today  Renal/GU negative Renal ROS     Musculoskeletal  (+) Arthritis ,    Abdominal  (+) + obese  Peds  Hematology  (+) Blood dyscrasia, anemia Denies blood thinner use. Normal platelet count   Anesthesia Other Findings Past Medical History: No date: Anemia No date: Anxiety No date: Aortic atherosclerosis (HCC) No date: Arthritis No date: CAD (coronary artery disease)     Comment:  a.) LHC 06/11/2007: 30% D1, 30% pRCA - med mgmt; b.) LHC              07/25/2011: 50% mLAD, 50% D1, 50% pRCA - med mgmt; c.)               LHC 10/01/2017: 50% p-mLAD, 80% D1 - med mgmt; d.) R/LHC               12/26/2021: 50% p-mLAD, 80% oD1-D1. mRA 15, mPA 29, mPWCP              25, AO sat 94%, PA sat 69%, CO 6.0, CI 3.1, LVEDP 20 No date: Chronic back pain No date: Chronic heart failure with preserved ejection fraction  (HFpEF) (HCC)     Comment:  a.) TTE 07/26/2017: EF 60-65%, no RWMAs, mild LVH, G1DD;              b.) TTE 07/17/2017: EF 60-65%, no RWMAs, G2DD, norm RVSF;              c.) TTE 09/08/2020: EF 60-65%, no RWMAs, G2DD, norm RVSF;              d.) TTE  12/26/2021: EF 55-60%, mild LVH, G2DD, mild MR,               AoV sclerosis without stenosis No date: CKD (chronic kidney disease), stage III (HCC) 2018: Complication of anesthesia     Comment:  a.) aspirated with supraglottoic airway removal               following wrist surgery No date: Constipation No date: DDD (degenerative disc disease), lumbar     Comment:  a.) s/p BILATERAL laminectomies with decompression and               fusion L2-S1; total of 2 surgeries (2017 and 2021) with               subsequent development of associated post-operative               infections each time No date: Depression No date: Diverticulosis 09/12/2017: Dysplastic nevus     Comment:  L sup buttock - mild  09/18/2018: Dysplastic nevus     Comment:  R sup med buttocks - mild  No date: E coli infection No date: Elevated hemidiaphragm (RIGHT) No date: GERD (gastroesophageal reflux disease) No date: Headache No date: Hemorrhoids 10/31/2022: History of 2019 novel coronavirus disease (COVID-19) No date: History of bilateral cataract extraction No date: History of colon polyps No date: History of gout No date: History of hiatal hernia 10/17/2017: History of loop recorder     Comment:  a.) LINQ ILR placed; b.) removed 03/04/2019 No date: History of vertigo No date: Hyperlipidemia No date: Hypertension No date: Hypothyroidism No date: Insomnia     Comment:  a.) on tamazepam qhs PRN No date: Left thyroid nodule No date: Long term current use of aspirin No date: Lymphedema No date: NSVT (nonsustained ventricular tachycardia) (HCC)     Comment:  a.) in setting of acute respiratory failure secondary to              AECOPD and CHF; aggressive diuresis + IV steroids led to               NSVT + frequent PVCs --> Tx'd with metoprolol               (significant fatigue with higher doses) No date: OSA on CPAP No date: Paroxysmal atrial tachycardia (HCC) 03/04/2019: Presence of permanent cardiac  pacemaker     Comment:  a.) s/p placement of Biotronik Edora 8 DR-T DC PPM on               03/04/2019 No date: Restless leg syndrome     Comment:  a.) on ropinirole No date: Rosacea No date: Statin intolerance No date: Symptomatic bradycardia     Comment:  a.) s/p placement of Biotronik Edora 8 DR-T DC PPM on               03/04/2019 No date: Syncope     Comment:  a.) documented bradycardia and pauses --> s/p Biotronik               Edora 8 DR-T DC PPM placement on 03/04/2019 No date: T2DM (type 2 diabetes mellitus) (HCC) No date: Varicose veins  Reproductive/Obstetrics                             Anesthesia Physical Anesthesia Plan  ASA: 3  Anesthesia Plan: General   Post-op Pain Management: Celebrex PO (pre-op)* and Ofirmev IV (intra-op)*   Induction: Intravenous and Rapid sequence  PONV Risk Score and Plan: 4 or greater and Ondansetron, Dexamethasone and Treatment may vary due to age or medical condition  Airway Management Planned: Oral ETT and Video Laryngoscope Planned  Additional Equipment: None  Intra-op Plan:   Post-operative Plan: Extubation in OR  Informed Consent: I have reviewed the patients History and Physical, chart, labs and discussed the procedure including the risks, benefits and alternatives for the proposed anesthesia with the patient or authorized representative who has indicated his/her understanding and acceptance.     Dental advisory given  Plan Discussed with: CRNA and Surgeon  Anesthesia Plan Comments: (I discussed r/b/a of spinal vs GETA, and given patient's history of aspiration pneumonia combined with her GLP1 use, obesity, and hx of lumbar fusions and rods, I suggested that GETA would be the safest option for her and she understands.  Discussed risks of anesthesia with patient, including PONV, sore throat, lip/dental/eye damage,  aspiration. Rare risks discussed as well, such as cardiorespiratory and neurological  sequelae, and allergic reactions. Discussed the role of CRNA in patient's perioperative care. Patient understands.)       Anesthesia Quick Evaluation

## 2023-01-14 NOTE — Op Note (Signed)
OPERATIVE NOTE  DATE OF SURGERY:  01/14/2023  PATIENT NAME:  MAURISSA SICHER   DOB: 1945-02-16  MRN: [id number]  PRE-OPERATIVE DIAGNOSIS: Degenerative arthrosis of the right knee, primary  POST-OPERATIVE DIAGNOSIS:  Same  PROCEDURE:  Right total knee arthroplasty using computer-assisted navigation  SURGEON:  Jena Gauss. M.D.  ASSISTANT:  Gean Birchwood, PA-C (present and scrubbed throughout the case, critical for assistance with exposure, retraction, instrumentation, and closure)  ANESTHESIA: general  ESTIMATED BLOOD LOSS: 50 mL  FLUIDS REPLACED: 400 mL of crystalloid  TOURNIQUET TIME: 78 minutes  DRAINS: 2 medium Hemovac drains  SOFT TISSUE RELEASES: Anterior cruciate ligament, posterior cruciate ligament, deep medial collateral ligament, patellofemoral ligament  IMPLANTS UTILIZED: DePuy Attune size 3 posterior stabilized femoral component (cemented), size 4 rotating platform tibial component (cemented), 32 mm medialized dome patella (cemented), and a 6 mm stabilized rotating platform polyethylene insert.  INDICATIONS FOR SURGERY: AMIRIAH WENIGER is a 77 y.o. year old female with a long history of progressive knee pain. X-rays demonstrated severe degenerative changes in tricompartmental fashion. The patient had not seen any significant improvement despite conservative nonsurgical intervention. After discussion of the risks and benefits of surgical intervention, the patient expressed understanding of the risks benefits and agree with plans for total knee arthroplasty.   The risks, benefits, and alternatives were discussed at length including but not limited to the risks of infection, bleeding, nerve injury, stiffness, blood clots, the need for revision surgery, cardiopulmonary complications, among others, and they were willing to proceed.  PROCEDURE IN DETAIL: The patient was brought into the operating room and, after adequate general anesthesia was achieved, a tourniquet was  placed on the patient's upper thigh. The patient's knee and leg were cleaned and prepped with alcohol and DuraPrep and draped in the usual sterile fashion. A "timeout" was performed as per usual protocol. The lower extremity was exsanguinated using an Esmarch, and the tourniquet was inflated to 300 mmHg. An anterior longitudinal incision was made followed by a standard mid vastus approach. The deep fibers of the medial collateral ligament were elevated in a subperiosteal fashion off of the medial flare of the tibia so as to maintain a continuous soft tissue sleeve. The patella was subluxed laterally and the patellofemoral ligament was incised. Inspection of the knee demonstrated severe degenerative changes with full-thickness loss of articular cartilage. Osteophytes were debrided using a rongeur. Anterior and posterior cruciate ligaments were excised. Two 4.0 mm Schanz pins were inserted in the femur and into the tibia for attachment of the array of trackers used for computer-assisted navigation. Hip center was identified using a circumduction technique. Distal landmarks were mapped using the computer. The distal femur and proximal tibia were mapped using the computer. The distal femoral cutting guide was positioned using computer-assisted navigation so as to achieve a 5 distal valgus cut. The femur was sized and it was felt that a size 3 femoral component was appropriate. A size 3 femoral cutting guide was positioned and the anterior cut was performed and verified using the computer. This was followed by completion of the posterior and chamfer cuts. Femoral cutting guide for the central box was then positioned in the center box cut was performed.  Attention was then directed to the proximal tibia. Medial and lateral menisci were excised. The extramedullary tibial cutting guide was positioned using computer-assisted navigation so as to achieve a 0 varus-valgus alignment and 3 posterior slope. The cut was  performed and verified using the computer. The proximal tibia  was sized and it was felt that a size 4 tibial tray was appropriate. Tibial and femoral trials were inserted followed by insertion of a 6 mm polyethylene insert. This allowed for excellent mediolateral soft tissue balancing both in flexion and in full extension. Finally, the patella was cut and prepared so as to accommodate a 32 mm medialized dome patella. A patella trial was placed and the knee was placed through a range of motion with excellent patellar tracking appreciated. The femoral trial was removed after debridement of posterior osteophytes. The central post-hole for the tibial component was reamed followed by insertion of a keel punch. Tibial trials were then removed. Cut surfaces of bone were irrigated with copious amounts of normal saline using pulsatile lavage and then suctioned dry. Polymethylmethacrylate cement with gentamicin was prepared in the usual fashion using a vacuum mixer. Cement was applied to the cut surface of the proximal tibia as well as along the undersurface of a size 4 rotating platform tibial component. Tibial component was positioned and impacted into place. Excess cement was removed using Personal assistant. Cement was then applied to the cut surfaces of the femur as well as along the posterior flanges of the size 3 femoral component. The femoral component was positioned and impacted into place. Excess cement was removed using Personal assistant. A 6 mm polyethylene trial was inserted and the knee was brought into full extension with steady axial compression applied. Finally, cement was applied to the backside of a 32 mm medialized dome patella and the patellar component was positioned and patellar clamp applied. Excess cement was removed using Personal assistant. After adequate curing of the cement, the tourniquet was deflated after a total tourniquet time of 78 minutes. Hemostasis was achieved using electrocautery. The knee was  irrigated with copious amounts of normal saline using pulsatile lavage followed by 450 ml of Surgiphor and then suctioned dry. 20 mL of 1.3% Exparel and 60 mL of 0.25% Marcaine in 40 mL of normal saline was injected along the posterior capsule, medial and lateral gutters, and along the arthrotomy site. A 6 mm stabilized rotating platform polyethylene insert was inserted and the knee was placed through a range of motion with excellent mediolateral soft tissue balancing appreciated and excellent patellar tracking noted. 2 medium drains were placed in the wound bed and brought out through separate stab incisions. The medial parapatellar portion of the incision was reapproximated using interrupted sutures of #1 Vicryl. Subcutaneous tissue was approximated in layers using first #0 Vicryl followed #2-0 Vicryl. The skin was approximated with skin staples. A sterile dressing was applied.  The patient tolerated the procedure well and was transported to the recovery room in stable condition.    Asani Mcburney P. Angie Fava., M.D.

## 2023-01-15 ENCOUNTER — Encounter: Payer: Self-pay | Admitting: Orthopedic Surgery

## 2023-01-15 DIAGNOSIS — M1711 Unilateral primary osteoarthritis, right knee: Secondary | ICD-10-CM | POA: Diagnosis not present

## 2023-01-15 LAB — GLUCOSE, CAPILLARY
Glucose-Capillary: 130 mg/dL — ABNORMAL HIGH (ref 70–99)
Glucose-Capillary: 110 mg/dL — ABNORMAL HIGH (ref 70–99)

## 2023-01-15 MED ORDER — METOCLOPRAMIDE HCL 10 MG PO TABS
ORAL_TABLET | ORAL | Status: AC
  Filled 2023-01-15: qty 1

## 2023-01-15 MED ORDER — ASPIRIN 81 MG PO TABS: 81.0000 mg | ORAL_TABLET | Freq: Two times a day (BID) | ORAL | Status: AC

## 2023-01-15 MED ORDER — ATENOLOL 50 MG PO TABS
ORAL_TABLET | ORAL | Status: AC
Start: 2023-01-15 — End: ?
  Filled 2023-01-15: qty 1

## 2023-01-15 MED ORDER — POTASSIUM CHLORIDE CRYS ER 20 MEQ PO TBCR
EXTENDED_RELEASE_TABLET | ORAL | Status: AC
  Filled 2023-01-15: qty 1

## 2023-01-15 MED ORDER — INSULIN ASPART 100 UNIT/ML IJ SOLN
INTRAMUSCULAR | Status: AC
  Filled 2023-01-15: qty 1

## 2023-01-15 MED ORDER — GABAPENTIN 300 MG PO CAPS
ORAL_CAPSULE | ORAL | Status: AC
  Filled 2023-01-15: qty 1

## 2023-01-15 MED ORDER — OXYCODONE HCL 5 MG PO TABS: 5.0000 mg | ORAL_TABLET | ORAL | 0 refills | Status: DC | PRN

## 2023-01-15 MED ORDER — CELECOXIB 200 MG PO CAPS: 200.0000 mg | ORAL_CAPSULE | Freq: Two times a day (BID) | ORAL | 1 refills | Status: DC

## 2023-01-15 MED ORDER — SENNOSIDES-DOCUSATE SODIUM 8.6-50 MG PO TABS
ORAL_TABLET | ORAL | Status: AC
  Filled 2023-01-15: qty 1

## 2023-01-15 MED ORDER — OXYCODONE HCL 5 MG PO TABS
ORAL_TABLET | ORAL | Status: AC
  Filled 2023-01-15: qty 1

## 2023-01-15 MED ORDER — ACETAMINOPHEN 10 MG/ML IV SOLN
INTRAVENOUS | Status: AC
Start: 2023-01-15 — End: ?
  Filled 2023-01-15: qty 100

## 2023-01-15 MED ORDER — FERROUS SULFATE 325 (65 FE) MG PO TABS
ORAL_TABLET | ORAL | Status: AC
  Filled 2023-01-15: qty 1

## 2023-01-15 MED ORDER — CELECOXIB 200 MG PO CAPS
ORAL_CAPSULE | ORAL | Status: AC
  Filled 2023-01-15: qty 1

## 2023-01-15 MED ORDER — TRAMADOL HCL 50 MG PO TABS: 50.0000 mg | ORAL_TABLET | Freq: Four times a day (QID) | ORAL | 0 refills | Status: DC | PRN

## 2023-01-15 MED ORDER — PANTOPRAZOLE SODIUM 40 MG PO TBEC
DELAYED_RELEASE_TABLET | ORAL | Status: AC
Start: 2023-01-15 — End: ?
  Filled 2023-01-15: qty 1

## 2023-01-15 MED ORDER — MAGNESIUM HYDROXIDE 400 MG/5ML PO SUSP
ORAL | Status: AC
  Filled 2023-01-15: qty 30

## 2023-01-15 MED ORDER — ASPIRIN 81 MG PO CHEW
CHEWABLE_TABLET | ORAL | Status: AC
Start: 2023-01-15 — End: ?
  Filled 2023-01-15: qty 1

## 2023-01-15 NOTE — Evaluation (Signed)
Occupational Therapy Evaluation Patient Details Name: [name] MRN: [id number] DOB: 1945/10/12 Today's Date: 01/15/2023   History of Present Illness Pt is 77 y/o admitted 01/14/23 for right total knee arthroplasty. Procedure dated 01/14/23.   Clinical Impression   Pt seen for OT evaluation this date, POD#1 from above surgery. Pt is alert and oriented x4. PTA pt is MOD I in ADL/IADL (uses AE for LB dressing/bathing), amb PRN with SPC. One fall reported a few weeks ago. Pt is eager to return to PLOF with less pain and improved safety and independence.  Pt provided education re: polar care mgt, falls prevention strategies, home/routines modifications, DME/AE for LB bathing and dressing tasks, and compression stocking mgt. Pt would benefit from skilled OT services including additional instruction in dressing techniques with or without assistive devices for dressing and bathing skills to support recall and carryover prior to discharge and ultimately to maximize safety, independence, and minimize falls risk and caregiver burden. Do not currently anticipate any OT needs following this hospitalization, however OT will follow acutely to optimize ADL performance.       If plan is discharge home, recommend the following: A little help with bathing/dressing/bathroom;Assistance with cooking/housework;Help with stairs or ramp for entrance;Assist for transportation    Functional Status Assessment  Patient has had a recent decline in their functional status and demonstrates the ability to make significant improvements in function in a reasonable and predictable amount of time.  Equipment Recommendations  None recommended by OT;Other (comment) (pt has bsc)    Recommendations for Other Services       Precautions / Restrictions Precautions Precautions: Knee Restrictions Weight Bearing Restrictions: Yes RLE Weight Bearing: Weight bearing as tolerated      Mobility Bed Mobility Overal bed mobility:  Needs Assistance Bed Mobility: Supine to Sit     Supine to sit: Supervision, HOB elevated, Used rails          Transfers Overall transfer level: Needs assistance Equipment used: Rolling walker (2 wheels) Transfers: Sit to/from Stand Sit to Stand: Supervision, Contact guard assist                  Balance Overall balance assessment: Needs assistance Sitting-balance support: Feet supported Sitting balance-Leahy Scale: Good     Standing balance support: Bilateral upper extremity supported, During functional activity, Reliant on assistive device for balance Standing balance-Leahy Scale: Fair                             ADL either performed or assessed with clinical judgement   ADL Overall ADL's : Needs assistance/impaired Eating/Feeding: Set up;Sitting   Grooming: Set up;Sitting Grooming Details (indicate cue type and reason): anticipate supervision standing         Upper Body Dressing : Set up   Lower Body Dressing: Minimal assistance;Sitting/lateral leans Lower Body Dressing Details (indicate cue type and reason): underwear, pants, shoes (has slip in sketchers); pt uses reacher at home at baseline Toilet Transfer: Supervision/safety;Contact guard assist;Rolling walker (2 wheels);BSC/3in1 Toilet Transfer Details (indicate cue type and reason): one vc for technique Toileting- Clothing Manipulation and Hygiene: Supervision/safety;Sitting/lateral lean Toileting - Clothing Manipulation Details (indicate cue type and reason): anticipate     Functional mobility during ADLs: Supervision/safety;Contact guard assist;Rolling walker (2 wheels) (approx 20' in room)       Vision Patient Visual Report: No change from baseline       Perception  Praxis         Pertinent Vitals/Pain Pain Assessment Pain Assessment: 0-10 Pain Score: 2  Pain Location: R knee Pain Descriptors / Indicators: Throbbing Pain Intervention(s): Monitored during session,  Repositioned     Extremity/Trunk Assessment Upper Extremity Assessment Upper Extremity Assessment: Overall WFL for tasks assessed   Lower Extremity Assessment Lower Extremity Assessment: RLE deficits/detail RLE Deficits / Details: s/p TKA   Cervical / Trunk Assessment Cervical / Trunk Assessment:  (history of back surgery with rods per pt report)   Communication Communication Communication: No apparent difficulties Cueing Techniques: Verbal cues;Visual cues   Cognition Arousal: Alert Behavior During Therapy: WFL for tasks assessed/performed Overall Cognitive Status: Within Functional Limits for tasks assessed                                       General Comments  vss throughout    Exercises     Shoulder Instructions      Home Living Family/patient expects to be discharged to:: Private residence Living Arrangements: Spouse/significant other (twin sister is next door to assist PRN) Available Help at Discharge: Family;Available 24 hours/day Type of Home: House Home Access: Stairs to enter [employer] of Steps: 1+1 Entrance Stairs-Rails: None Home Layout: One level     Bathroom Shower/Tub: Producer, television/film/video: Handicapped height Bathroom Accessibility: No   Home Equipment: Cane - single point;Shower seat;Adaptive equipment;Rollator (4 wheels);Grab bars - tub/shower;BSC/3in1 Adaptive Equipment: Reacher        Prior Functioning/Environment Prior Level of Function : Driving;Independent/Modified Independent             Mobility Comments: PRN use of SPC ADLs Comments: MOD I with ADL/IADL PTA        OT Problem List: Decreased activity tolerance;Decreased knowledge of use of DME or AE;Impaired balance (sitting and/or standing)      OT Treatment/Interventions: Self-care/ADL training;Therapeutic exercise;Patient/family education;Balance training;DME and/or AE instruction    OT Goals(Current goals can be found in the  care plan section) Acute Rehab OT Goals Patient Stated Goal: go home OT Goal Formulation: With patient Time For Goal Achievement: 01/29/23 Potential to Achieve Goals: Good ADL Goals Pt Will Perform Grooming: with modified independence;standing Pt Will Perform Lower Body Dressing: with modified independence;sit to/from stand Pt Will Transfer to Toilet: with modified independence;ambulating Pt Will Perform Toileting - Clothing Manipulation and hygiene: with modified independence;sit to/from stand  OT Frequency: Min 1X/week    Co-evaluation              AM-PAC OT "6 Clicks" Daily Activity     Outcome Measure Help from another person eating meals?: None Help from another person taking care of personal grooming?: None Help from another person toileting, which includes using toliet, bedpan, or urinal?: None Help from another person bathing (including washing, rinsing, drying)?: A Little Help from another person to put on and taking off regular upper body clothing?: None Help from another person to put on and taking off regular lower body clothing?: A Little 6 Click Score: 22   End of Session Equipment Utilized During Treatment: Rolling walker (2 wheels) Nurse Communication: Mobility status  Activity Tolerance: Patient tolerated treatment well Patient left: in chair;with call bell/phone within reach  OT Visit Diagnosis: Other abnormalities of gait and mobility (R26.89)                Time: 8295-6213 OT Time Calculation (  min): 26 min Charges:  OT General Charges $OT Visit: 1 Visit OT Evaluation $OT Eval Low Complexity: 1 Low  Oleta Mouse, OTD OTR/L  01/15/23, 9:51 AM

## 2023-01-15 NOTE — Progress Notes (Signed)
Patient is not able to walk the distance required to go the bathroom, or he/she is unable to safely negotiate stairs required to access the bathroom.  A 3in1 BSC will alleviate this problem   [name], Jr. M.D.  

## 2023-01-15 NOTE — Progress Notes (Signed)
Pt discharged and IV removed. Pt given education and expresses no further questions or concerns. Pt dc with both TED hose placed along with polar care on right knee. Scripts were given to pt along with two honeycombs dressings.  and pt wheeled down to medical mall entrance. Pt's husband provided transportation.

## 2023-01-15 NOTE — Plan of Care (Signed)
  Problem: Pain Management: Goal: Pain level will decrease with appropriate interventions Outcome: Progressing   Problem: Skin Integrity: Goal: Will show signs of wound healing Outcome: Progressing   

## 2023-01-15 NOTE — Plan of Care (Signed)

## 2023-01-15 NOTE — TOC Progression Note (Signed)
Transition of Care [facility]) - Progression Note    Patient Details  Name: [name] MRN: [id number] Date of Birth: 11-19-1945  [facility] CM/SW Contact  Marlowe Sax, RN Phone Number: 01/15/2023, 8:07 AM  Clinical Narrative:    The patient lives at home with her husband, she is set up with [facility] for [facility] prior to surgery by surgeons office, she needs a RW, [employer] will deliver to the bedside prior to DC   Expected Discharge Plan: Home w Home Health Services Barriers to Discharge: No Barriers Identified  Expected Discharge Plan and Services   Discharge Planning Services: CM Consult   Living arrangements for the past 2 months: Single Family Home                 DME Arranged: Walker rolling DME Agency: [employer] Date DME Agency Contacted: 01/15/23 Time DME Agency Contacted: [phone] Representative spoke with at DME Agency: Cletis [place] HH Arranged: PT, OT [facility] Agency: [facility] Date [facility] Agency Contacted: 01/15/23 Time HH Agency Contacted: [phone] Representative spoke with at [facility] Agency: [place]   Social Determinants of Health (SDOH) Interventions SDOH Screenings   Food Insecurity: No Food Insecurity (01/14/2023)  Housing: Low Risk  (01/14/2023)  Transportation Needs: No Transportation Needs (01/14/2023)  Utilities: Not At Risk (01/14/2023)  Financial Resource Strain: Low Risk  (12/14/2022)   Received from [facility]  Tobacco Use: Low Risk  (01/14/2023)    Readmission Risk Interventions     No data to display

## 2023-01-15 NOTE — Progress Notes (Signed)
Physical Therapy Treatment Patient Details Name: [name] MRN: [id number] DOB: 06-17-1945 Today's Date: 01/15/2023   History of Present Illness Pt is 77 y/o admitted 01/14/23 for right total knee arthroplasty. Procedure dated 01/14/23.    PT Comments  Pt received in recliner and agreed to PT session. Pt reported pain to be on a scale of 3-4/10. Pt performed STS with the use of RW (2wheels) SUP, amb ~236ft with RW CGA, and completed stair negotiation CGA. Pt demonstrated/expressed understanding for TKR HEP and precautions packet provided as well as WB precautions during mobility. Goniometric measurements read: Right knee flexion= 71 degrees and Right knee extension= 1 degree. Pt tolerated Tx well and will no longer need skilled PT sessions as all acute care PT goals have been met; D/C in house.     If plan is discharge home, recommend the following: A little help with walking and/or transfers;Help with stairs or ramp for entrance;Assist for transportation   Can travel by private vehicle        Equipment Recommendations  Rolling walker (2 wheels)    Recommendations for Other Services       Precautions / Restrictions Precautions Precautions: Knee Precaution Booklet Issued: Yes (comment) Precaution Comments: PT TKR HEP and precautions packet and OT TKR handout provided. Restrictions Weight Bearing Restrictions: Yes RLE Weight Bearing: Weight bearing as tolerated     Mobility  Bed Mobility               General bed mobility comments: Pt received in recliner. Bed mobility not observed    Transfers Overall transfer level: Needs assistance Equipment used: Rolling walker (2 wheels) Transfers: Sit to/from Stand Sit to Stand: Supervision           General transfer comment: Pt performed STS with the use of RW (2wheels) SUP without reports of s/sx relative to dizziness.    Ambulation/Gait Ambulation/Gait assistance: Contact guard assist Gait Distance (Feet): 200  Feet Assistive device: Rolling walker (2 wheels) Gait Pattern/deviations: Step-to pattern, Antalgic Gait velocity: decreased     General Gait Details: Pt amb with the use of RW (2wheels) CGA. VC necessary for RW management.   Stairs Stairs: Yes Stairs assistance: Contact guard assist Stair Management: One rail Right, Two rails, Step to pattern Number of Stairs: 8 General stair comments: Pt performed stair negotiation CGA and demonstrated/expressed comfortability with activity   Wheelchair Mobility     Tilt Bed    Modified Rankin (Stroke Patients Only)       Balance Overall balance assessment: Needs assistance Sitting-balance support: Feet supported Sitting balance-Leahy Scale: Good     Standing balance support: Bilateral upper extremity supported, During functional activity, Reliant on assistive device for balance Standing balance-Leahy Scale: Fair                              Cognition Arousal: Alert Behavior During Therapy: WFL for tasks assessed/performed Overall Cognitive Status: Within Functional Limits for tasks assessed                                 General Comments: AOx4. Pt pleasant and willing to participate in PT session.        Exercises Total Joint Exercises Ankle Circles/Pumps: AROM, Strengthening, Both, 5 reps, Seated Quad Sets: AROM, Strengthening, Right, 5 reps, Seated Short Arc Quad: AROM, Strengthening, Right, 5 reps, Seated Heel Slides: AROM,  Strengthening, Right, 5 reps, Seated Hip ABduction/ADduction: AROM, Strengthening, Right, 5 reps, Seated Straight Leg Raises: AROM, Strengthening, Right, 5 reps, Seated Long Arc Quad: AROM, Strengthening, Right, 5 reps, Seated Knee Flexion: AROM, Strengthening, Right, 5 reps, Seated Goniometric ROM: Right knee flexion= 71 degrees; Right knee extension= 1 degree.    General Comments        Pertinent Vitals/Pain Pain Assessment Pain Assessment: 0-10 Pain Score: 3   Pain Location: R knee Pain Descriptors / Indicators: Throbbing Pain Intervention(s): Monitored during session    Home Living                          Prior Function            PT Goals (current goals can now be found in the care plan section) Acute Rehab PT Goals Patient Stated Goal: To go home PT Goal Formulation: With patient Time For Goal Achievement: 01/28/23 Potential to Achieve Goals: Good Progress towards PT goals: Goals met/education completed, patient discharged from PT    Frequency    BID      PT Plan      Co-evaluation              AM-PAC PT "6 Clicks" Mobility   Outcome Measure  Help needed turning from your back to your side while in a flat bed without using bedrails?: None Help needed moving from lying on your back to sitting on the side of a flat bed without using bedrails?: A Little Help needed moving to and from a bed to a chair (including a wheelchair)?: A Little Help needed standing up from a chair using your arms (e.g., wheelchair or bedside chair)?: A Little Help needed to walk in hospital room?: A Little Help needed climbing 3-5 steps with a railing? : A Lot 6 Click Score: 18    End of Session Equipment Utilized During Treatment: Gait belt Activity Tolerance: Patient tolerated treatment well Patient left: in chair;with call bell/phone within reach Nurse Communication: Mobility status PT Visit Diagnosis: Unsteadiness on feet (R26.81);Other abnormalities of gait and mobility (R26.89);Muscle weakness (generalized) (M62.81);Difficulty in walking, not elsewhere classified (R26.2);Pain Pain - Right/Left: Right Pain - part of body: Knee     Time: 1010-1037 PT Time Calculation (min) (ACUTE ONLY): 27 min  Charges:    $Gait Training: 8-22 mins $Therapeutic Exercise: 8-22 mins PT General Charges $$ ACUTE PT VISIT: 1 Visit                     Lida Berkery Sauvignon Howard SPT, LAT, ATC  Mariaelena Cade Sauvignon-Howard 01/15/2023, 1:54  PM

## 2023-01-29 ENCOUNTER — Ambulatory Visit
Admission: RE | Admit: 2023-01-29 | Discharge: 2023-01-29 | Disposition: A | Discharge status: Home / Self Care | Payer: Medicare Other | Source: Ambulatory Visit | Attending: Student | Admitting: Student

## 2023-01-29 ENCOUNTER — Other Ambulatory Visit: Payer: Self-pay | Admitting: Student

## 2023-01-29 DIAGNOSIS — M79661 Pain in right lower leg: Secondary | ICD-10-CM

## 2023-01-29 DIAGNOSIS — M7989 Other specified soft tissue disorders: Secondary | ICD-10-CM | POA: Insufficient documentation

## 2023-01-29 DIAGNOSIS — G8918 Other acute postprocedural pain: Secondary | ICD-10-CM

## 2023-02-27 ENCOUNTER — Ambulatory Visit (INDEPENDENT_AMBULATORY_CARE_PROVIDER_SITE_OTHER): Payer: Medicare Other

## 2023-02-27 DIAGNOSIS — R55 Syncope and collapse: Secondary | ICD-10-CM | POA: Diagnosis not present

## 2023-02-27 LAB — CUP PACEART REMOTE DEVICE CHECK
Battery Remaining Percentage: 75 %
Brady Statistic RA Percent Paced: 26 %
Brady Statistic RV Percent Paced: 0 %
Date Time Interrogation Session: 20250115074148
Implantable Lead Connection Status: 753985
Implantable Lead Connection Status: 753985
Implantable Lead Implant Date: 20210120
Implantable Lead Implant Date: 20210120
Implantable Lead Location: 753859
Implantable Lead Location: 753860
Implantable Lead Model: 5076
Implantable Lead Model: 5076
Implantable Pulse Generator Implant Date: 20210120
Lead Channel Impedance Value: 371 Ohm
Lead Channel Impedance Value: 761 Ohm
Lead Channel Pacing Threshold Amplitude: 0.6 V
Lead Channel Pacing Threshold Pulse Width: 0.4 ms
Lead Channel Sensing Intrinsic Amplitude: 2.8 mV
Lead Channel Sensing Intrinsic Amplitude: 3.7 mV
Lead Channel Setting Pacing Amplitude: 3 V
Lead Channel Setting Pacing Amplitude: 3 V
Lead Channel Setting Pacing Pulse Width: 0.4 ms
Pulse Gen Model: 407145
Pulse Gen Serial Number: 69765687

## 2023-03-05 ENCOUNTER — Ambulatory Visit: Payer: Medicare Other | Admitting: Dermatology

## 2023-03-07 ENCOUNTER — Other Ambulatory Visit: Payer: Self-pay

## 2023-03-07 MED ORDER — POTASSIUM CHLORIDE ER 10 MEQ PO TBCR: EXTENDED_RELEASE_TABLET | ORAL | 3 refills | Status: DC

## 2023-03-07 NOTE — Telephone Encounter (Signed)
Please review dose of Potassium for a refill from [facility] pharmacy. The patient has several different instructions in her chart.

## 2023-03-08 ENCOUNTER — Telehealth: Payer: Self-pay

## 2023-03-08 MED ORDER — POTASSIUM CHLORIDE ER 10 MEQ PO TBCR: 30.0000 meq | EXTENDED_RELEASE_TABLET | Freq: Two times a day (BID) | ORAL | 3 refills | Status: DC

## 2023-03-08 NOTE — Telephone Encounter (Signed)
[facility] faxed a Rx with conflicting directions for Potassium Chloride 10 meq.  The Rx received states Potassium Chloride 10 meq tablet.  Take 3 tablet (30 meq) by mouth twice daily as directed. Take an additional tablet (10 meq) with an extra 20 mg Torsemide. Take an additional 3 tablets (30 meq) with extra dose or torsemide 40 mg. Take 2 tablets (20 meq) when taking Metolazone.  Spoke with Dr. Mariah Milling regarding the conflicting  instructions. Dr. Mariah Milling changed the directions: Take Potassium Chloride 30 meq twice a day. (Give as 10 meq pills). QTY 540 with 3 refills.   I Faxed the new instructions to [facility] for correction.  Correction made in medication list.

## 2023-03-22 ENCOUNTER — Other Ambulatory Visit: Payer: Self-pay | Admitting: Family Medicine

## 2023-03-22 ENCOUNTER — Telehealth: Payer: Self-pay

## 2023-03-22 DIAGNOSIS — N63 Unspecified lump in unspecified breast: Secondary | ICD-10-CM

## 2023-03-22 DIAGNOSIS — N6322 Unspecified lump in the left breast, upper inner quadrant: Secondary | ICD-10-CM

## 2023-03-22 DIAGNOSIS — Z1231 Encounter for screening mammogram for malignant neoplasm of breast: Secondary | ICD-10-CM

## 2023-03-22 NOTE — Telephone Encounter (Signed)
 Received call from Dr Mardee requesting to schedule this patient with our office. He states he spoke with Dr Clois about this patient. Per their discussion, she may need an EMG. She has had numbness in the right foot since general anesthesia for her knee surgery on 01/14/23.

## 2023-03-22 NOTE — Telephone Encounter (Signed)
Left detailed message on her secure voice mail. 

## 2023-03-27 ENCOUNTER — Ambulatory Visit (INDEPENDENT_AMBULATORY_CARE_PROVIDER_SITE_OTHER): Payer: Medicare Other | Admitting: Internal Medicine

## 2023-03-27 ENCOUNTER — Encounter: Payer: Self-pay | Admitting: Internal Medicine

## 2023-03-27 VITALS — BP 114/60 | HR 82 | Temp 97.8°F | Ht 61.0 in | Wt 191.2 lb

## 2023-03-27 DIAGNOSIS — G4733 Obstructive sleep apnea (adult) (pediatric): Secondary | ICD-10-CM

## 2023-03-27 NOTE — Patient Instructions (Signed)
Excellent Job A+ GOLD STAR!!  Continue CPAP as prescribed  Continue albuterol as needed  Avoid Allergens and Irritants Avoid secondhand smoke Avoid SICK contacts Recommend  Masking  when appropriate Recommend Keep up-to-date with vaccinations

## 2023-03-27 NOTE — Progress Notes (Unsigned)
Referring Physician:  Donato Heinz, MD [address] [facility] [place],  [place] [address]  Primary Physician:  Danella Penton, MD  History of Present Illness: 03/28/2023 [name] is a 78 y.o with a history of OSA, obesity, diabetes, hyperlipidemia, hypertension, CKD stage III, CHF, pacemaker placed in 2021 who is here today with a chief complaint of right foot numbness and weakness.  She states that this started after her knee replacement with Dr. Ernest Pine in January.  She describes severe numbness and hypersensitivity to the bottom of her right foot and arch.  Also endorses a new onset of weakness with pushing her toes down.  She does have some numbness around her knee incision but denies any radiating numbness or tingling.  She continues to have chronic back pain that has slowly progressed over the years but is still manageable with injections.  She denies any similar left-sided symptoms.  Conservative measures:  Physical therapy: participated in for her knee recently but not for back.  Multimodal medical therapy including regular antiinflammatories:  Tylenol, Voltaren Gel,Gabapentin, Oxycodone Injections: By Dr. Yves Dill  03/14/2023: Right L1-2 and right L4-5 transforaminal ESI 12/05/2022: Right L1-2 and right L4-5 transforaminal ESI (60% relief, [place] location)  06/29/2022: Right L1-2 and right L4-5 transforaminal ESI (over 70% relief) 02/20/2022: Right L1-2 and right L4-5 transforaminal ESI (over 70% relief)  11/14/2021: Right L1-2 and right L4-5 transforaminal ESI (initially 70% relief then sustained greater than 50% relief) 07/20/2021: Right L1-2 and right L4-5 transforaminal ESI (70% relief) 04/18/2021: Right L1-2 and right L5-S1 transforaminal ESI (70% relief) 01/17/2021: Right L1-2 transforaminal ESI (over 60% relief) 11/30/2020: Right L1-2 transforaminal ESI (25% relief) 08/01/2020: Right sacrococcygeal injection (good relief of tailbone  pain) 08/10/2019: L2-3 XLIF (Dr. Myer Haff) 04/23/2019: Right L2-3 transforaminal ESI (relief of upper back pain, no relief of L3 radicular symptoms; right L3-4 transforaminal approach was aborted due to tachycardia, lightheadedness, O2 sat of 89%) 03/24/2019: Right sacroiliac joint injection under ultrasound 01/20/2019: Right L2-3 and right L3-4 transforaminal ESI (moderate to good relief x6 weeks) 12/05/2018: Right L2-3 and right L3-4 transforaminal ESI (moderate relief)   Past Surgery:  Right knee replacement 02/26/23 Dr. Ernest Pine DOS 08/10/19 - L2-3 XLIF, PSF (revision) DOS 08/24/19 - I and D and revision of posterior DOS 09/14/19 - debridement by Dr. Gerald Leitz has no symptoms of cervical myelopathy.  The symptoms are causing a significant impact on the patient's life.   11/10/20 Danielle Alycia Rossetti PA-C Dhyana ZAHRIYAH JOO is a 78 y.o. female who presents with the chief complaint of recurrent back and right leg symptoms. She was seen last in May of this year with similar symptoms. She underwent a CT myelogram of her thoracic and lumbar spine in June which were largely normal and showed interval fusion of her construct. She then saw Dr. Landry Mellow and underwent a diagnostic right hip injection however she cannot recall her response to this. She was also seen by Dr. Yves Dill and underwent a right Right sacrococcygeal injection on 08/01/20. She states that this injection was done for tailbone pain which has improved in the interval. She states that she was moving some totes in her house in August and had recurrence of her symptoms in September that have persisted since then. Because of her pain she feels like she is able to be less active and walk less. She continues to deny any left-sided symptoms. It is unclear what improved her pain from May through late August/  early September of this year.  Dr. Lucienne Capers note from 06/30/20: HISTORY OF PRESENT ILLNESS: [name] is status post L2-3 lateral  lumbar interbody fusion with revision of her posterior instrumentation. Unfortunately, she underwent irrigation and debridement for wound necrosis and infection. She has done very well from that and her skin is now healed.  She had her right knee. She is still having pain in the knee posterior aspect of her right hip. Is been very bothersome to her. She is still better than she was prior to surgery. However, she is very frustrated with her inability to make further progress.   Review of Systems:  A 10 point review of systems is negative, except for the pertinent positives and negatives detailed in the HPI.  Past Medical History: Past Medical History:  Diagnosis Date   Anemia    Anxiety    Aortic atherosclerosis (HCC)    Arthritis    CAD (coronary artery disease)    a.) LHC 06/11/2007: 30% D1, 30% pRCA - med mgmt; b.) LHC 07/25/2011: 50% mLAD, 50% D1, 50% pRCA - med mgmt; c.) LHC 10/01/2017: 50% p-mLAD, 80% D1 - med mgmt; d.) R/LHC 12/26/2021: 50% p-mLAD, 80% oD1-D1. mRA 15, mPA 29, mPWCP 25, AO sat 94%, PA sat 69%, CO 6.0, CI 3.1, LVEDP 20   Chronic back pain    Chronic heart failure with preserved ejection fraction (HFpEF) (HCC)    a.) TTE 07/26/2017: EF 60-65%, no RWMAs, mild LVH, G1DD; b.) TTE 07/17/2017: EF 60-65%, no RWMAs, G2DD, norm RVSF; c.) TTE 09/08/2020: EF 60-65%, no RWMAs, G2DD, norm RVSF; d.) TTE 12/26/2021: EF 55-60%, mild LVH, G2DD, mild MR, AoV sclerosis without stenosis   CKD (chronic kidney disease), stage III (HCC)    Complication of anesthesia 2018   a.) aspirated with supraglottoic airway removal following wrist surgery   Constipation    DDD (degenerative disc disease), lumbar    a.) s/p BILATERAL laminectomies with decompression and fusion L2-S1; total of 2 surgeries (2017 and 2021) with subsequent development of associated post-operative infections each time   Depression    Diverticulosis    Dysplastic nevus 09/12/2017   L sup buttock - mild    Dysplastic nevus  09/18/2018   R sup med buttocks - mild    E coli infection    Elevated hemidiaphragm (RIGHT)    GERD (gastroesophageal reflux disease)    Headache    Hemorrhoids    History of 2019 novel coronavirus disease (COVID-19) 10/31/2022   History of bilateral cataract extraction    History of colon polyps    History of gout    History of hiatal hernia    History of loop recorder 10/17/2017   a.) LINQ ILR placed; b.) removed 03/04/2019   History of vertigo    Hyperlipidemia    Hypertension    Hypothyroidism    Insomnia    a.) on tamazepam qhs PRN   Left thyroid nodule    Long term current use of aspirin    Lymphedema    NSVT (nonsustained ventricular tachycardia) (HCC)    a.) in setting of acute respiratory failure secondary to AECOPD and CHF; aggressive diuresis + IV steroids led to NSVT + frequent PVCs --> Tx'd with metoprolol (significant fatigue with higher doses)   OSA on CPAP    Paroxysmal atrial tachycardia (HCC)    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   a.) s/p placement of Biotronik [place] 8 DR-T DC PPM on 03/04/2019   Restless leg  syndrome    a.) on ropinirole   Rosacea    Statin intolerance    Symptomatic bradycardia    a.) s/p placement of Biotronik [place] 8 DR-T DC PPM on 03/04/2019   Syncope    a.) documented bradycardia and pauses --> s/p Biotronik [place] 8 DR-T DC PPM placement on 03/04/2019   T2DM (type 2 diabetes mellitus) (HCC)    Varicose veins     Past Surgical History: Past Surgical History:  Procedure Laterality Date   BACK SURGERY     CARPAL TUNNEL RELEASE Bilateral    CATARACT EXTRACTION W/ INTRAOCULAR LENS IMPLANT     CHOLECYSTECTOMY     COLONOSCOPY     COLONOSCOPY WITH PROPOFOL N/A 11/25/2018   Procedure: COLONOSCOPY WITH PROPOFOL;  Surgeon: Toledo, Boykin Nearing, MD;  Location: [facility] ENDOSCOPY;  Service: Gastroenterology;  Laterality: N/A;   DIAGNOSTIC LAPAROSCOPY     multiple times   DILATION AND CURETTAGE OF UTERUS      ESOPHAGOGASTRODUODENOSCOPY (EGD) WITH PROPOFOL N/A 11/01/2014   Procedure: ESOPHAGOGASTRODUODENOSCOPY (EGD) WITH PROPOFOL;  Surgeon: Wallace Cullens, MD;  Location: [facility] ENDOSCOPY;  Service: Gastroenterology;  Laterality: N/A;   ESOPHAGOGASTRODUODENOSCOPY (EGD) WITH PROPOFOL N/A 11/25/2018   Procedure: ESOPHAGOGASTRODUODENOSCOPY (EGD) WITH PROPOFOL;  Surgeon: Toledo, Boykin Nearing, MD;  Location: [facility] ENDOSCOPY;  Service: Gastroenterology;  Laterality: N/A;   ESOPHAGOGASTRODUODENOSCOPY (EGD) WITH PROPOFOL N/A 07/11/2022   Procedure: ESOPHAGOGASTRODUODENOSCOPY (EGD) WITH PROPOFOL;  Surgeon: Toledo, Boykin Nearing, MD;  Location: [facility] ENDOSCOPY;  Service: Gastroenterology;  Laterality: N/A;  IDDM   HARDWARE REMOVAL Left 10/11/2016   Procedure: HARDWARE REMOVAL-LEFT RADIUS;  Surgeon: Lyndle Herrlich, MD;  Location: [facility] ORS;  Service: Orthopedics;  Laterality: Left;  Left Radius Wrist    KNEE ARTHROPLASTY Right 01/14/2023   Procedure: COMPUTER ASSISTED TOTAL KNEE ARTHROPLASTY;  Surgeon: Donato Heinz, MD;  Location: [facility] ORS;  Service: Orthopedics;  Laterality: Right;   KNEE ARTHROSCOPY Right 05/09/2020   Procedure: ARTHROSCOPY KNEE;  Surgeon: Donato Heinz, MD;  Location: [facility] ORS;  Service: Orthopedics;  Laterality: Right;   LAMINECTOMY  11/13/2015   LEFT HEART CATH AND CORONARY ANGIOGRAPHY  06/11/2007   Procedure: LEFT HEART CATH AND CORONARY ANGIOGRAPHY; Location: [facility]; Surgeon: Arvilla Meres, MD   LEFT HEART CATH AND CORONARY ANGIOGRAPHY Left 10/01/2017   Procedure: LEFT HEART CATH AND CORONARY ANGIOGRAPHY;  Surgeon: Yvonne Kendall, MD;  Location: [facility] INVASIVE CV LAB;  Service: Cardiovascular;  Laterality: Left;   LEFT HEART CATH AND CORONARY ANGIOGRAPHY Left 07/25/2011   Procedure: LEFT HEART CATH AND CORONARY ANGIOGRAPHY; Location: [facility]; Surgeon: Beverly Gust, MD   LOOP RECORDER INSERTION N/A 10/17/2017   Procedure: LOOP RECORDER INSERTION;  Surgeon: Duke Salvia, MD;  Location: [facility] INVASIVE  CV LAB;  Service: Cardiovascular;  Laterality: N/A;   LOOP RECORDER REMOVAL N/A 03/04/2019   Procedure: LOOP RECORDER REMOVAL;  Surgeon: Duke Salvia, MD;  Location: [facility] INVASIVE CV LAB;  Service: Cardiovascular;  Laterality: N/A;   LUMBAR FUSION  11/2015   LUMBAR WOUND DEBRIDEMENT N/A 12/02/2015   Procedure: WOUND Exploration;  Surgeon: Lisbeth Renshaw, MD;  Location: [facility] OR;  Service: Neurosurgery;  Laterality: N/A;   OPEN REDUCTION INTERNAL FIXATION (ORIF) DISTAL RADIAL FRACTURE Left 08/29/2016   Procedure: OPEN REDUCTION INTERNAL FIXATION (ORIF) DISTAL RADIAL FRACTURE;  Surgeon: Lyndle Herrlich, MD;  Location: [facility] ORS;  Service: Orthopedics;  Laterality: Left;   PACEMAKER IMPLANT N/A 03/04/2019   Procedure: PACEMAKER IMPLANT;  Surgeon: Duke Salvia, MD;  Location: [facility] INVASIVE CV LAB;  Service: Cardiovascular;  Laterality: N/A;   PICC LINE PLACE PERIPHERAL ([facility] HX)     right upper arm    RIGHT/LEFT HEART CATH AND CORONARY ANGIOGRAPHY N/A 12/26/2021   Procedure: RIGHT/LEFT HEART CATH AND CORONARY ANGIOGRAPHY;  Surgeon: Yvonne Kendall, MD;  Location: [facility] INVASIVE CV LAB;  Service: Cardiovascular;  Laterality: N/A;   ROTATOR CUFF REPAIR Right    SAVORY DILATION N/A 11/01/2014   Procedure: SAVORY DILATION;  Surgeon: Wallace Cullens, MD;  Location: [facility] ENDOSCOPY;  Service: Gastroenterology;  Laterality: N/A;   TONSILLECTOMY     TOTAL ABDOMINAL HYSTERECTOMY W/ BILATERAL SALPINGOOPHORECTOMY     TRIGGER FINGER RELEASE Bilateral     Allergies: Allergies as of 03/28/2023 - Review Complete 03/27/2023  Allergen Reaction Noted   Ezetimibe Diarrhea and Nausea Only 07/19/2017   Hydrocodone-acetaminophen Other (See Comments) 09/25/2021   Metoclopramide Diarrhea 07/30/2017   Propoxyphene Other (See Comments) 11/24/2018   Cefuroxime  09/25/2021   Hydrocodone Other (See Comments) 07/26/2020   Tramadol Hives 08/08/2018   Ceftin [cefuroxime axetil] Diarrhea 11/04/2015   Codeine Rash 05/05/2010    Penicillins Rash and Other (See Comments) 05/05/2010   Statins Other (See Comments) 06/05/2013   Vicodin [hydrocodone-acetaminophen] Other (See Comments) 10/23/2013    Medications: Outpatient Encounter Medications as of 03/28/2023  Medication Sig   Acetaminophen (TYLENOL ARTHRITIS PAIN PO) Take 1,300 mg by mouth every 8 (eight) hours as needed (pain).   albuterol (PROVENTIL) (2.5 MG/3ML) 0.083% nebulizer solution Take 3 mLs (2.5 mg total) by nebulization every 4 (four) hours as needed for wheezing or shortness of breath.   albuterol (VENTOLIN HFA) 108 (90 Base) MCG/ACT inhaler Inhale 2 puffs into the lungs every 6 (six) hours as needed.   aspirin 81 MG tablet Take 1 tablet (81 mg total) by mouth 2 (two) times daily.   atenolol (TENORMIN) 25 MG tablet Take 12.5 mg by mouth 2 (two) times daily.   cholecalciferol (VITAMIN D3) 25 MCG (1000 UNIT) tablet Take 2,000 Units by mouth daily.   Crisaborole (EUCRISA) 2 % OINT Apply 1 Application topically as directed. qd to bid to aa eczema on back as needed for flares   diclofenac Sodium (VOLTAREN) 1 % GEL Apply 2 g topically 2 (two) times daily as needed (pain).   doxycycline (PERIOSTAT) 20 MG tablet Take 1 tablet (20 mg total) by mouth 2 (two) times daily.   empagliflozin (JARDIANCE) 10 MG TABS tablet Take 10 mg by mouth daily.   esomeprazole (NEXIUM) 40 MG capsule Take 40 mg by mouth 2 (two) times daily.   etodolac (LODINE) 400 MG tablet Take 400 mg by mouth 2 (two) times daily.   gabapentin (NEURONTIN) 300 MG capsule Take 300 mg by mouth 2 (two) times daily.   Insulin Aspart (NOVOLOG FLEXPEN [place]) Inject 10 Units into the skin daily as needed (when eating a high sugar meal).   LANTUS SOLOSTAR 100 UNIT/ML Solostar Pen Inject 22 Units into the skin every morning.   levothyroxine (SYNTHROID, LEVOTHROID) 75 MCG tablet Take 75 mcg by mouth daily before breakfast.    metolazone (ZAROXOLYN) 2.5 MG tablet Take 2.5 mg by mouth daily as needed (fluid  overload). Weight over 215 lb   mometasone (ELOCON) 0.1 % cream Apply 1 application topically daily as needed (Rash). Qd to bid up to 5 days a week aa right lower leg prn itching   nitroGLYCERIN (NITROSTAT) 0.4 MG SL tablet Place 1 tablet (0.4 mg total) under the tongue every 5 (five) minutes as needed for chest  pain.   NON FORMULARY Apply 1 application  topically 2 (two) times daily as needed. Azelaic acid/metronidazole/ivermectin 15%/1%/1% Apply to face 1-2 daily as needed for rosacea   ondansetron (ZOFRAN) 4 MG tablet Take 4 mg by mouth every 8 (eight) hours as needed for nausea or vomiting.    oxyCODONE (OXY IR/ROXICODONE) 5 MG immediate release tablet Take 1 tablet (5 mg total) by mouth every 4 (four) hours as needed for moderate pain (pain score 4-6) (pain score 4-6).   potassium chloride (KLOR-CON) 10 MEQ tablet Take 3 tablets (30 mEq total) by mouth 2 (two) times daily.   PRALUENT 150 MG/ML SOAJ INJECT 150 MG UNDER THE SKIN EVERY 14 DAYS   ranolazine (RANEXA) 500 MG 12 hr tablet Take 500 mg by mouth 2 (two) times daily.   ropinirole (REQUIP) 5 MG tablet Take 5 mg by mouth every evening.   Semaglutide,0.25 or 0.5MG /DOS, (OZEMPIC, 0.25 OR 0.5 MG/DOSE,) 2 MG/1.5ML SOPN Inject 0.25 mg into the skin once a week.   temazepam (RESTORIL) 30 MG capsule Take 30 mg by mouth at bedtime.   torsemide (DEMADEX) 20 MG tablet Take 2 tablets (40 mg total) by mouth daily. For weight <200, stay on torsemide 40 daily For weight >200 to 205, torsemide 40 in Am, extra torsemide 20 mg after lunch For weight >205, take torsemide 40 mg twice a day (couple days)   trolamine salicylate (ASPERCREME) 10 % cream Apply 1 application topically at bedtime.   Facility-Administered Encounter Medications as of 03/28/2023  Medication   dexamethasone (DECADRON) injection 10 mg    Social History: Social History   Tobacco Use   Smoking status: Never   Smokeless tobacco: Never  Vaping Use   Vaping status: Never Used   Substance Use Topics   Alcohol use: No   Drug use: No    Family Medical History: Family History  Problem Relation Age of Onset   Heart disease Mother    Breast cancer Mother 52   Heart disease Father    Heart attack Sister    Heart disease Sister    Heart disease Brother     Physical Examination: Today's Vitals   03/28/23 1005  BP: 112/60  Weight: 87 kg  Height: 5\' 1"  (1.549 m)  PainSc: 4   PainLoc: Back   Body mass index is 36.24 kg/m.   General: Patient is well developed, well nourished, calm, collected, and in no apparent distress. Attention to examination is appropriate.  Psychiatric: Patient is non-anxious.  Head:  Pupils equal, round, and reactive to light.  ENT:  Oral mucosa appears well hydrated.  Neck:   Supple.   Respiratory: Patient is breathing without any difficulty.  Extremities: No edema.  Vascular: Palpable dorsal pedal pulses.  Skin:   On exposed skin, there are no abnormal skin lesions.  NEUROLOGICAL:     Awake, alert, oriented to person, place, and time.  Speech is clear and fluent. Fund of knowledge is appropriate.   Cranial Nerves: Pupils equal round and reactive to light.  Facial tone is symmetric.  Facial sensation is symmetric.   Strength: Side Biceps Triceps Deltoid Interossei Grip Wrist Ext. Wrist Flex.  R 5 5 5 5 5 5 5   L 5 5 5 5 5 5 5    Side Iliopsoas Quads Hamstring PF DF EHL  R 5 5 5 3  4+ 4+  L 5 5 5 5 5 5    Reflexes are 1+ and symmetric at the biceps, triceps, brachioradialis, patella and  achilles.   Hoffman's is absent.  Clonus is not present.  Toes are down-going.  Numbness to the plantar aspect of the right foot and into her arch. Ambulates with the assistance of a cane.  Medical Decision Making  Imaging: Imaging: CT myelogram lumbar spine 07/22/20 IMPRESSION:  1. Interval fusion at L2-3 with decompression of the central canal  and foramina bilaterally. Retrolisthesis is stable in now fused.  2. Solid fusion  at L2-3, L3-4, L4-5, and L5-S1 without residual or  recurrent stenosis at these levels.  3. Mild osseous narrowing on the left at L5-S1 is stable.  4. Aortic Atherosclerosis (ICD10-I70.0).   Electronically Signed    By: Marin Roberts M.D.    On: 07/22/2020 06:35  CT myelogram thoracic spine 07/22/2020 IMPRESSION:  1. Multilevel spondylosis of the thoracic spine as described.  2. Most significant right foraminal narrowing is at T3-4, T8-9 and  T9-10  3. The most significant left foraminal narrowing is at T5-6, T6-7,  T7-8, and T8-9.  4. Mild central canal stenosis at T8-9.  5. Mild rightward curvature of the thoracic spine.  6. Aortic Atherosclerosis (ICD10-I70.0).   Electronically Signed    By: Marin Roberts M.D.    On: 07/22/2020 06:26   Assessment and Plan: Ms. Fortenberry is a pleasant 78 y.o. female with a new onset of right foot weakness and numbness after a recent total knee replacement.  Her symptoms are not radicular in nature despite her ongoing back pain and did not improve with an epidural injection a couple of weeks ago.  I recommended obtaining an EMG.  I will contact her via telephone visit to discuss the results of this and further plan of care.  She was encouraged to call the office in the interim with any questions or concerns.  She expressed understanding and was in agreement with this plan.  Thank you for involving me in the care of this patient.   I spent a total of 45 minutes in both face-to-face and non-face-to-face activities for this visit on the date of this encounter.   [facility] of [facility]

## 2023-03-27 NOTE — Progress Notes (Signed)
Name: [name] MRN: [id number] DOB: 06-Apr-1945    CHIEF COMPLAINT:  Follow-up assessment for OSA   HISTORY OF PRESENT ILLNESS: Excellent compliance report Reviewed in detail with patient AHI well-controlled Patient use and benefits from therapy  Discussed sleep data and reviewed with patient.  Encouraged proper weight management.  Discussed driving precautions and its relationship with hypersomnolence.  Discussed operating dangerous equipment and its relationship with hypersomnolence.  Discussed sleep hygiene, and benefits of a fixed sleep waked time.  The importance of getting eight or more hours of sleep discussed with patient.  Discussed limiting the use of the computer and television before bedtime.  Decrease naps during the day, so night time sleep will become enhanced.  Limit caffeine, and sleep deprivation.  HTN, stroke, and heart failure are potential risk factors.   Discussed risk of untreated sleep apnea including cardiac arrhthymias, stroke, DM, pulm HTN.   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  Previous  home PT has had back surgery in the past Right hemidiaphragm elevation   PAST MEDICAL HISTORY :   has a past medical history of Anemia, Anxiety, Aortic atherosclerosis (HCC), Arthritis, CAD (coronary artery disease), Chronic back pain, Chronic heart failure with preserved ejection fraction (HFpEF) (HCC), CKD (chronic kidney disease), stage III (HCC), Complication of anesthesia (2018), Constipation, DDD  (degenerative disc disease), lumbar, Depression, Diverticulosis, Dysplastic nevus (09/12/2017), Dysplastic nevus (09/18/2018), E coli infection, Elevated hemidiaphragm (RIGHT), GERD (gastroesophageal reflux disease), Headache, Hemorrhoids, History of 2019 novel coronavirus disease (COVID-19) (10/31/2022), History of bilateral cataract extraction, History of colon polyps, History of gout, History of hiatal hernia, History of loop recorder (10/17/2017), History of vertigo, Hyperlipidemia, Hypertension, Hypothyroidism, Insomnia, Left thyroid nodule, Long term current use of aspirin, Lymphedema, NSVT (nonsustained ventricular tachycardia) (HCC), OSA on CPAP, Paroxysmal atrial tachycardia (HCC), Presence of 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  has a past surgical history that includes Tonsillectomy; Cholecystectomy; Cataract extraction w/ intraocular lens implant; Esophagogastroduodenoscopy (egd) with propofol (N/A, 11/01/2014); Savory dilation (N/A, 11/01/2014); Total abdominal hysterectomy w/ bilateral salpingoophorectomy; LEFT HEART CATH AND CORONARY ANGIOGRAPHY (06/11/2007); Trigger finger release (Bilateral); Carpal tunnel release (Bilateral); Rotator cuff repair (Right); Dilation and curettage of uterus; Diagnostic laparoscopy; Colonoscopy; Laminectomy (11/13/2015); Lumbar wound debridement (N/A, 12/02/2015); PICC LINE PLACE PERIPHERAL ([facility] HX); Open reduction internal fixation (orif) distal radial fracture (Left, 08/29/2016); Lumbar fusion (11/2015); Hardware Removal (Left, 10/11/2016); LEFT HEART CATH AND CORONARY ANGIOGRAPHY (Left, 10/01/2017); LOOP RECORDER INSERTION (N/A, 10/17/2017); Colonoscopy with propofol (N/A, 11/25/2018); Esophagogastroduodenoscopy (egd) with propofol (N/A, 11/25/2018); PACEMAKER IMPLANT (N/A, 03/04/2019); LOOP RECORDER REMOVAL (N/A,  03/04/2019); Knee arthroscopy (Right, 05/09/2020); Back surgery; RIGHT/LEFT HEART CATH AND CORONARY ANGIOGRAPHY (N/A, 12/26/2021); Esophagogastroduodenoscopy (egd) with propofol (N/A, 07/11/2022); LEFT HEART CATH AND CORONARY ANGIOGRAPHY (Left, 07/25/2011); and Knee Arthroplasty (Right, 01/14/2023). Prior to Admission medications   Medication Sig Start Date End Date Taking? Authorizing Provider  Acetaminophen (TYLENOL ARTHRITIS PAIN PO) Take 1,300 mg by mouth every 8 (eight) hours as needed (pain).   Yes [provider]  albuterol (VENTOLIN  HFA) 108 (90 Base) MCG/ACT inhaler Inhale 1 puff into the lungs every 4 (four) hours as needed. 01/23/22 01/23/23 Yes [provider]  aspirin 81 MG tablet Take 81 mg by mouth at bedtime.    Yes [provider]  atenolol (TENORMIN) 25 MG tablet Take 1 tablet (25 mg total) by mouth every morning. 12/27/21  Yes Sreenath, Sudheer B, MD  Azelaic Acid (FINACEA) 15 % FOAM Apply 1 application. topically as directed. Qd to bid to aa face 06/29/21  Yes Deirdre Evener, MD  azelastine (ASTELIN) 0.1 % nasal spray Place 2 sprays into both nostrils 2 (two) times daily as needed for rhinitis.   Yes [provider]  cholecalciferol (VITAMIN D3) 25 MCG (1000 UNIT) tablet Take 1,000 Units by mouth daily.   Yes [provider]  diclofenac Sodium (VOLTAREN) 1 % GEL Apply 2 g topically 2 (two) times daily.   Yes [provider]  empagliflozin (JARDIANCE) 10 MG TABS tablet Take 10 mg by mouth daily. 12/15/21 12/15/22 Yes [provider]  esomeprazole (NEXIUM) 40 MG capsule Take 40 mg by mouth 2 (two) times daily.   Yes [provider]  gabapentin (NEURONTIN) 300 MG capsule Take 300 mg by mouth 2 (two) times daily.   Yes [provider]  Insulin Aspart (NOVOLOG FLEXPEN [place]) Inject 10 Units into the skin as needed (diabetes).   Yes [provider]  LANTUS SOLOSTAR 100 UNIT/ML Solostar Pen Inject 28  Units into the skin every morning. 06/27/20  Yes [provider]  levothyroxine (SYNTHROID, LEVOTHROID) 75 MCG tablet Take 75 mcg by mouth daily before breakfast.  11/24/12  Yes [provider]  methocarbamol (ROBAXIN) 500 MG tablet Take 500 mg by mouth every 8 (eight) hours as needed for muscle spasms.   Yes [provider]  metolazone (ZAROXOLYN) 2.5 MG tablet Take 2.5 mg by mouth daily as needed (fluid overload). 12/29/21  Yes [provider]  mometasone (ELOCON) 0.1 % cream Apply 1 application topically daily as needed (Rash). Qd to bid up to 5 days a week aa right lower leg prn itching 06/22/20  Yes Deirdre Evener, MD  nitroGLYCERIN (NITROSTAT) 0.4 MG SL tablet Place 1 tablet (0.4 mg total) under the tongue every 5 (five) minutes as needed for chest pain. 07/13/21 01/25/23 Yes Gollan, Tollie Pizza, MD  ondansetron (ZOFRAN) 4 MG tablet Take 4 mg by mouth every 8 (eight) hours as needed for nausea or vomiting.  07/19/17  Yes [provider]  ondansetron (ZOFRAN-ODT) 4 MG disintegrating tablet Take 1 tablet (4 mg total) by mouth every 6 (six) hours as needed for nausea or vomiting. 05/06/21  Yes Sharyn Creamer, MD  potassium chloride (KLOR-CON) 10 MEQ tablet Take 2 tablet (20 meq) by mouth twice daily as directed 03/05/22  Yes Gollan, Tollie Pizza, MD  PRALUENT 150 MG/ML SOAJ INJECT 150 MG UNDER THE SKIN EVERY 14 DAYS 12/28/21  Yes Antonieta Iba, MD  ranolazine (RANEXA) 500 MG 12 hr tablet Take 2 tablets (1,000 mg total) by mouth 2 (two) times daily. 08/18/21  Yes Dunn, Raymon Mutton, PA-C  ropinirole (REQUIP) 5 MG tablet Take 5 mg by mouth at bedtime.   Yes [provider]  temazepam (RESTORIL) 30 MG capsule Take 30 mg by mouth at bedtime.   Yes [provider]  torsemide (DEMADEX) 20 MG tablet Take 2 tablets (40 mg total) by mouth 2 (two) times daily. As directed 02/27/22  Yes Gollan, Tollie Pizza, MD  traMADol (ULTRAM) 50 MG tablet Take 50 mg by mouth every  6 (six) hours as needed for moderate pain or severe pain.   Yes [provider]  trolamine salicylate (ASPERCREME) 10 % cream Apply 1 application topically at bedtime.   Yes [provider]   Allergies  Allergen Reactions   Ezetimibe Diarrhea and Nausea Only   Hydrocodone-Acetaminophen Other (See Comments)   Metoclopramide Diarrhea   Propoxyphene Other (See Comments)    Unsure of reaction type Unsure of reaction type Unsure of reaction type   Cefuroxime    Hydrocodone Other (See Comments)   Tramadol Hives   Ceftin [Cefuroxime Axetil] Diarrhea   Codeine Rash        Penicillins Rash and Other (See Comments)    IgE = 22 (WNL) on 01/03/2023 Has patient had a PCN reaction causing immediate rash, facial/tongue/throat swelling, SOB or lightheadedness with hypotension: NO Has patient had a PCN reaction causing severe rash involving mucus membranes or skin necrosis: NO Has patient had a PCN retioion that required hospitalization NO Has patient had a PCN reaction occurring within the last 10 years: NO If all of the above answers are "NO", then may proceed with Cephalosporin use.   Statins Other (See Comments)    Leg pain Leg pain Other reaction(s): restless leg syndrome   Vicodin [Hydrocodone-Acetaminophen] Other (See Comments)    passes out    FAMILY HISTORY:  family history includes Breast cancer (age of onset: 46) in her mother; Heart attack in her sister; Heart disease in her brother, father, mother, and sister. SOCIAL HISTORY:  reports that she has never smoked. She has never used smokeless tobacco. She reports that she does not drink alcohol and does not use drugs.  BP 114/60 (BP Location: Left Arm, Patient Position: Sitting, Cuff Size: Normal)   Pulse 82   Temp 97.8 F (36.6 C) (Temporal)   Ht 5\' 1"  (1.549 m)   Wt 191 lb 3.2 oz (86.7 kg)   SpO2 98%   BMI 36.13 kg/m     Review of Systems: Gen:  Denies  fever, sweats, chills weight loss  HEENT: Denies  blurred vision, double vision, ear pain, eye pain, hearing loss, nose bleeds, sore throat Cardiac:  No dizziness, chest pain or heaviness, chest tightness,edema, No JVD Resp:   No cough, -sputum production, -shortness of breath,-wheezing, -hemoptysis,  Other:  All other systems negative   Physical Examination:   General Appearance: No distress  EYES PERRLA, EOM intact.   NECK Supple, No JVD Pulmonary: normal breath sounds, No wheezing.  CardiovascularNormal S1,S2.  No m/r/g.   Abdomen: Benign, Soft, non-tender. Neurology UE/LE 5/5 strength, no focal deficits Ext pulses intact, cap refill intact ALL OTHER ROS ARE NEGATIVE    ALL OTHER ROS ARE NEGATIVE    ASSESSMENT AND PLAN SYNOPSIS  78 year old pleasant white female seen today for asthma for sleep apnea with underlying diagnosis of early disease with multiple surgeries in the past right sided elevated hemidiaphragm with underlying restrictive physiology     Assessment of Sleep apnea Patient has excellent compliance report Discussed in detail with patient OSA is well-controlled with CPAP Continue current prescription  Patient Instructions Continue to use CPAP every night, minimum of 4-6 hours a night.  Change equipment every 30 days or as directed by DME.  Wash your tubing with warm soap and water daily, hang to dry. Wash humidifier portion weekly. Use bottled, distilled water and change daily   Be aware of reduced alertness and  do not drive or operate heavy machinery if experiencing this or drowsiness.  Exercise encouraged, as tolerated. Encouraged proper weight management.  Important to get eight or more hours of sleep  Limiting the use of the computer and television before bedtime.  Decrease naps during the day, so night time sleep will become enhanced.  Limit caffeine, and sleep deprivation.  HTN, stroke, uncontrolled diabetes and heart failure are potential risk factors.  Risk of untreated sleep apnea including  cardiac arrhthymias, stroke, DM, pulm HTN.     THYROID NODULE Being followed by Dr. Gershon Crane endocrinology   Obesity -recommend significant weight loss -recommend changing diet  Deconditioned state -Recommend increased daily activity and exercise  CAD + diastolic dysfunction. 2/2 pulmonary hypertension ( high wedge).  She has a pacemaker in place Following cardiology recs (Dr. Lovett Calender)     MEDICATION ADJUSTMENTS/LABS AND TESTS ORDERED: Continue CPAP as prescribed Continue albuterol as needed Avoid Allergens and Irritants Avoid secondhand smoke Avoid SICK contacts Recommend  Masking  when appropriate Recommend Keep up-to-date with vaccinations  CURRENT MEDICATIONS REVIEWED AT LENGTH WITH PATIENT TODAY   Patient  satisfied with Plan of action and management. All questions answered   Follow up  6 months   I spent a total of 42 minutes reviewing chart data, face-to-face evaluation with the patient, counseling and coordination of care as detailed above.      Lucie Leather, M.D.  [facility]  Medical Director [facility] Medical Director [facility] Cardio-Pulmonary Department

## 2023-03-28 ENCOUNTER — Ambulatory Visit (INDEPENDENT_AMBULATORY_CARE_PROVIDER_SITE_OTHER): Payer: Medicare Other | Admitting: Neurosurgery

## 2023-03-28 VITALS — BP 112/60 | Ht 61.0 in | Wt 191.8 lb

## 2023-03-28 DIAGNOSIS — R29898 Other symptoms and signs involving the musculoskeletal system: Secondary | ICD-10-CM

## 2023-03-28 DIAGNOSIS — R200 Anesthesia of skin: Secondary | ICD-10-CM

## 2023-04-01 ENCOUNTER — Encounter: Payer: Self-pay | Admitting: Neurology

## 2023-04-01 ENCOUNTER — Other Ambulatory Visit: Payer: Self-pay

## 2023-04-01 DIAGNOSIS — R202 Paresthesia of skin: Secondary | ICD-10-CM

## 2023-04-09 NOTE — Progress Notes (Signed)
 Remote pacemaker transmission.

## 2023-04-12 ENCOUNTER — Ambulatory Visit
Admission: RE | Admit: 2023-04-12 | Discharge: 2023-04-12 | Disposition: A | Discharge status: Home / Self Care | Payer: Medicare Other | Source: Ambulatory Visit | Attending: Family Medicine | Admitting: Family Medicine

## 2023-04-12 DIAGNOSIS — Z1231 Encounter for screening mammogram for malignant neoplasm of breast: Secondary | ICD-10-CM | POA: Diagnosis present

## 2023-04-12 DIAGNOSIS — N63 Unspecified lump in unspecified breast: Secondary | ICD-10-CM

## 2023-04-12 DIAGNOSIS — N6322 Unspecified lump in the left breast, upper inner quadrant: Secondary | ICD-10-CM

## 2023-04-16 ENCOUNTER — Other Ambulatory Visit: Payer: Self-pay | Admitting: *Deleted

## 2023-04-16 MED ORDER — TORSEMIDE 20 MG PO TABS: 40.0000 mg | ORAL_TABLET | Freq: Every day | ORAL | 0 refills | Status: DC

## 2023-04-22 ENCOUNTER — Other Ambulatory Visit: Payer: Self-pay

## 2023-04-22 MED ORDER — TORSEMIDE 20 MG PO TABS: 40.0000 mg | ORAL_TABLET | Freq: Every day | ORAL | 0 refills | Status: DC

## 2023-04-23 ENCOUNTER — Other Ambulatory Visit: Payer: Self-pay | Admitting: Emergency Medicine

## 2023-04-23 MED ORDER — TORSEMIDE 20 MG PO TABS: 40.0000 mg | ORAL_TABLET | Freq: Every day | ORAL | 1 refills | Status: DC

## 2023-04-24 ENCOUNTER — Other Ambulatory Visit: Payer: Self-pay | Admitting: Emergency Medicine

## 2023-04-24 MED ORDER — TORSEMIDE 20 MG PO TABS: 40.0000 mg | ORAL_TABLET | Freq: Every day | ORAL | 1 refills | Status: AC

## 2023-05-03 ENCOUNTER — Ambulatory Visit (INDEPENDENT_AMBULATORY_CARE_PROVIDER_SITE_OTHER): Payer: Medicare Other | Admitting: Neurology

## 2023-05-03 DIAGNOSIS — R202 Paresthesia of skin: Secondary | ICD-10-CM | POA: Diagnosis not present

## 2023-05-03 DIAGNOSIS — G5701 Lesion of sciatic nerve, right lower limb: Secondary | ICD-10-CM

## 2023-05-03 NOTE — Procedures (Signed)
 [facility]  [address] [place], [address]  [place], [place] [address] Tel: [phone] Fax: [phone] Test Date:  05/03/2023  Patient: [name] DOB: 1945-08-03 Physician: Nita Sickle, DO  Sex: Female Height: 5\' 1"  Ref Phys: Manning Charity, PA  ID#: [id number]   Technician:    History: This is a 78 year old female referred for right foot weakness and numbness following total knee arthroscopy.  NCV & EMG Findings: Extensive electrodiagnostic testing of the right lower extremity and additional studies of the left shows:  Bilateral sural and left superficial peroneal sensory responses are within normal limits.  Right superficial peroneal sensory response is absent. Bilateral peroneal and left tibial motor responses are within normal limits.  Right tibial motor response is absent. Bilateral tibial H reflex studies are within normal limits. Reduced motor unit recruitment is seen in the right tibialis anterior, gastrocnemius, and biceps femoris short head muscles, without accompanied active denervation.  Despite maximal activation, no motor unit recruitment is seen in the right flexor digitorum longus muscle.  These findings are not present in the left lower extremity.  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   ___________________________ Nita Sickle, DO    Nerve Conduction Studies   Stim Site NR Peak (ms) Norm Peak (ms) O-P Amp (V) Norm O-P Amp  Left Sup Peroneal Anti Sensory (Ant Lat Mall)  32 C  12 cm    2.5 <4.6 6.5 >3  Right Sup Peroneal Anti Sensory (Ant Lat Mall)  32 C  12 cm *NR  <4.6  >3  Left Sural Anti Sensory (Lat Mall)  32 C  Calf    3.0 <4.6 5.2 >3  Right Sural Anti Sensory (Lat Mall)  32 C  Calf    2.6 <4.6 5.0 >3     Stim Site NR Onset (ms) Norm Onset (ms) O-P Amp (mV) Norm O-P Amp Site1 Site2  Delta-0 (ms) Dist (cm) Vel (m/s) Norm Vel (m/s)  Left Peroneal Motor (Ext Dig Brev)  32 C  Ankle    3.0 <6.0 2.9 >2.5 B Fib Ankle 7.1 32.0 45 >40  B Fib    10.1  2.5  Poplt B Fib 1.6 8.0 50 >40  Poplt    11.7  2.5         Right Peroneal Motor (Ext Dig Brev)  32 C  Ankle    3.5 <6.0 3.1 >2.5 B Fib Ankle 7.4 33.0 45 >40  B Fib    10.9  3.1  Poplt B Fib 1.4 8.0 57 >40  Poplt    12.3  3.1         Left Tibial Motor (Abd Hall Brev)  32 C  Ankle    3.8 <6.0 9.4 >4 Knee Ankle 8.2 39.0 48 >40  Knee    12.0  7.8         Right Tibial Motor (Abd Hall Brev)  32 C  Ankle *NR  <6.0  >4 Knee Ankle  0.0  >40  Knee *NR             Electromyography   Side Muscle Ins.Act Fibs Fasc Recrt Amp Dur Poly Activation Comment  Right AntTibialis Nml Nml Nml *2- Nml Nml Nml Nml N/A  Right Gastroc Nml Nml Nml *2- Nml Nml Nml Nml N/A  Right Flex Dig Long Nml Nml Nml *None *- *- *- Nml N/A  Right RectFemoris Nml Nml Nml Nml Nml Nml Nml Nml N/A  Right BicepsFemS Nml Nml Nml *1- Nml Nml Nml Nml N/A  Right GluteusMed Nml Nml Nml Nml Nml Nml Nml Nml N/A  Right Semimembran Nml Nml Nml Nml Nml Nml Nml Nml N/A  Left AntTibialis Nml Nml Nml Nml Nml Nml Nml Nml N/A  Left Gastroc Nml Nml Nml Nml Nml Nml Nml Nml N/A  Left Flex Dig Long Nml Nml Nml Nml Nml Nml Nml Nml N/A  Left RectFemoris Nml Nml Nml Nml Nml Nml Nml Nml N/A  Left GluteusMed Nml Nml Nml Nml Nml Nml Nml Nml N/A  Left BicepsFemS Nml Nml Nml Nml Nml Nml Nml Nml N/A  Right Semimembran Nml Nml Nml Nml Nml Nml Nml Nml N/A      Waveforms:

## 2023-05-09 ENCOUNTER — Ambulatory Visit (INDEPENDENT_AMBULATORY_CARE_PROVIDER_SITE_OTHER): Admitting: Neurosurgery

## 2023-05-09 DIAGNOSIS — R200 Anesthesia of skin: Secondary | ICD-10-CM | POA: Diagnosis not present

## 2023-05-09 DIAGNOSIS — R29898 Other symptoms and signs involving the musculoskeletal system: Secondary | ICD-10-CM | POA: Diagnosis not present

## 2023-05-09 NOTE — Progress Notes (Signed)
 Neurosurgery Telephone (Audio-Only) Note  Requesting Provider     Danella Penton, MD [address] [facility] [address] [facility] [place],  [place] [address] T: [phone] F: [phone]  Primary Care Provider Danella Penton, MD [address] [facility] [address] [facility] [place] [address] T: [phone] F: [phone]  Telehealth visit was conducted with [name], a 78 y.o. female via telephone.   History of Present Illness: [name] is a 78 y.o presenting today via telephone visit to review her EMG results.  Her symptoms are unchanged.  03/28/2023 [name] is a 78 y.o with a history of OSA, obesity, diabetes, hyperlipidemia, hypertension, CKD stage III, CHF, pacemaker placed in 2021 who is here today with a chief complaint of right foot numbness and weakness.  She states that this started after her knee replacement with Dr. Ernest Pine in January.  She describes severe numbness and hypersensitivity to the bottom of her right foot and arch.  Also endorses a new onset of weakness with pushing her toes down.  She does have some numbness around her knee incision but denies any radiating numbness or tingling.  She continues to have chronic back pain that has slowly progressed over the years but is still manageable with injections.  She denies any similar left-sided symptoms.   Conservative measures:  Physical therapy: participated in for her knee recently but not for back.  Multimodal medical therapy including regular antiinflammatories:  Tylenol, Voltaren Gel,Gabapentin, Oxycodone Injections: By Dr. Yves Dill  03/14/2023: Right L1-2 and right L4-5 transforaminal ESI 12/05/2022: Right L1-2 and right L4-5 transforaminal ESI (60% relief, [place] location)  06/29/2022: Right L1-2 and right L4-5 transforaminal ESI (over 70% relief) 02/20/2022: Right L1-2 and right L4-5 transforaminal ESI (over 70% relief)  11/14/2021: Right L1-2 and right L4-5  transforaminal ESI (initially 70% relief then sustained greater than 50% relief) 07/20/2021: Right L1-2 and right L4-5 transforaminal ESI (70% relief) 04/18/2021: Right L1-2 and right L5-S1 transforaminal ESI (70% relief) 01/17/2021: Right L1-2 transforaminal ESI (over 60% relief) 11/30/2020: Right L1-2 transforaminal ESI (25% relief) 08/01/2020: Right sacrococcygeal injection (good relief of tailbone pain) 08/10/2019: L2-3 XLIF (Dr. Myer Haff) 04/23/2019: Right L2-3 transforaminal ESI (relief of upper back pain, no relief of L3 radicular symptoms; right L3-4 transforaminal approach was aborted due to tachycardia, lightheadedness, O2 sat of 89%) 03/24/2019: Right sacroiliac joint injection under ultrasound 01/20/2019: Right L2-3 and right L3-4 transforaminal ESI (moderate to good relief x6 weeks) 12/05/2018: Right L2-3 and right L3-4 transforaminal ESI (moderate relief)    Past Surgery:  Right knee replacement 02/26/23 Dr. Ernest Pine DOS 08/10/19 - L2-3 XLIF, PSF (revision) DOS 08/24/19 - I and D and revision of posterior DOS 09/14/19 - debridement by Dr. Gerald Leitz has no symptoms of cervical myelopathy.   The symptoms are causing a significant impact on the patient's life.    11/10/20 Ana Verry Alycia Rossetti PA-C [name] is a 78 y.o. female who presents with the chief complaint of recurrent back and right leg symptoms. She was seen last in May of this year with similar symptoms. She underwent a CT myelogram of her thoracic and lumbar spine in June which were largely normal and showed interval fusion of her construct. She then saw Dr. Landry Mellow and underwent a diagnostic right hip injection however she cannot recall her response to this. She was also seen by Dr. Yves Dill and underwent a right Right sacrococcygeal injection on 08/01/20. She states that this injection was  done for tailbone pain which has improved in the interval. She states that she was moving some totes in her house in August  and had recurrence of her symptoms in September that have persisted since then. Because of her pain she feels like she is able to be less active and walk less. She continues to deny any left-sided symptoms. It is unclear what improved her pain from May through late August/ early September of this year.  Dr. Lucienne Capers note from 06/30/20: HISTORY OF PRESENT ILLNESS: [name] is status post L2-3 lateral lumbar interbody fusion with revision of her posterior instrumentation. Unfortunately, she underwent irrigation and debridement for wound necrosis and infection. She has done very well from that and her skin is now healed.  She had her right knee. She is still having pain in the knee posterior aspect of her right hip. Is been very bothersome to her. She is still better than she was prior to surgery. However, she is very frustrated with her inability to make further progress.   General Review of Systems:  A ROS was performed including pertinent positive and negatives as documented.  All other systems are negative.  Prior to Admission medications   Medication Sig Start Date End Date Taking? Authorizing Provider  Acetaminophen (TYLENOL ARTHRITIS PAIN PO) Take 1,300 mg by mouth every 8 (eight) hours as needed (pain).    [provider]  albuterol (PROVENTIL) (2.5 MG/3ML) 0.083% nebulizer solution Take 3 mLs (2.5 mg total) by nebulization every 4 (four) hours as needed for wheezing or shortness of breath. 03/27/22   Erin Fulling, MD  albuterol (VENTOLIN HFA) 108 (90 Base) MCG/ACT inhaler Inhale 2 puffs into the lungs every 6 (six) hours as needed. 01/23/22 03/28/23  [provider]  aspirin 81 MG tablet Take 1 tablet (81 mg total) by mouth 2 (two) times daily. 01/15/23   Rayburn Go, PA-C  atenolol (TENORMIN) 25 MG tablet Take 12.5 mg by mouth 2 (two) times daily.    [provider]  cholecalciferol (VITAMIN D3) 25 MCG (1000 UNIT) tablet Take 2,000 Units by mouth daily.     [provider]  Crisaborole (EUCRISA) 2 % OINT Apply 1 Application topically as directed. qd to bid to aa eczema on back as needed for flares 09/27/22   Deirdre Evener, MD  diclofenac Sodium (VOLTAREN) 1 % GEL Apply 2 g topically 2 (two) times daily as needed (pain).    [provider]  doxycycline (PERIOSTAT) 20 MG tablet Take 1 tablet (20 mg total) by mouth 2 (two) times daily. 12/25/22   Deirdre Evener, MD  empagliflozin (JARDIANCE) 10 MG TABS tablet Take 10 mg by mouth daily.    [provider]  escitalopram (LEXAPRO) 10 MG tablet Take 5mg  by mouth daily 03/19/23 03/18/24  [provider]  esomeprazole (NEXIUM) 40 MG capsule Take 40 mg by mouth 2 (two) times daily.    [provider]  etodolac (LODINE) 400 MG tablet Take 400 mg by mouth 2 (two) times daily.    [provider]  gabapentin (NEURONTIN) 300 MG capsule Take 300 mg by mouth 2 (two) times daily.    [provider]  Insulin Aspart (NOVOLOG FLEXPEN [place]) Inject 10 Units into the skin daily as needed (when eating a high sugar meal).    [provider]  LANTUS SOLOSTAR 100 UNIT/ML Solostar Pen Inject 22 Units into the skin every morning. 06/27/20   [provider]  levothyroxine (SYNTHROID, LEVOTHROID) 75  MCG tablet Take 75 mcg by mouth daily before breakfast.  11/24/12   [provider]  metolazone (ZAROXOLYN) 2.5 MG tablet Take 2.5 mg by mouth daily as needed (fluid overload). Weight over 215 lb 12/29/21   [provider]  mometasone (ELOCON) 0.1 % cream Apply 1 application topically daily as needed (Rash). Qd to bid up to 5 days a week aa right lower leg prn itching 06/22/20   Deirdre Evener, MD  nitroGLYCERIN (NITROSTAT) 0.4 MG SL tablet Place 1 tablet (0.4 mg total) under the tongue every 5 (five) minutes as needed for chest pain. 12/07/22   Antonieta Iba, MD  NON FORMULARY Apply 1 application  topically 2 (two) times daily as needed.  Azelaic acid/metronidazole/ivermectin 15%/1%/1% Apply to face 1-2 daily as needed for rosacea    [provider]  ondansetron (ZOFRAN) 4 MG tablet Take 4 mg by mouth every 8 (eight) hours as needed for nausea or vomiting.  07/19/17   [provider]  potassium chloride (KLOR-CON) 10 MEQ tablet Take 3 tablets (30 mEq total) by mouth 2 (two) times daily. 03/08/23 06/06/23  Antonieta Iba, MD  PRALUENT 150 MG/ML SOAJ INJECT 150 MG UNDER THE SKIN EVERY 14 DAYS 12/14/22   Antonieta Iba, MD  ranolazine (RANEXA) 500 MG 12 hr tablet Take 500 mg by mouth 2 (two) times daily.    [provider]  ropinirole (REQUIP) 5 MG tablet Take 5 mg by mouth every evening.    [provider]  Semaglutide,0.25 or 0.5MG /DOS, (OZEMPIC, 0.25 OR 0.5 MG/DOSE,) 2 MG/1.5ML SOPN Inject 0.25 mg into the skin once a week.    [provider]  temazepam (RESTORIL) 30 MG capsule Take 30 mg by mouth at bedtime.    [provider]  torsemide (DEMADEX) 20 MG tablet Take 2 tablets (40 mg total) by mouth daily. May take an additional 1-2 tablets (20 MG-40 MG) as needed for weight gain or swelling. 04/24/23   Antonieta Iba, MD  trolamine salicylate (ASPERCREME) 10 % cream Apply 1 application topically at bedtime.    [provider]    DATA REVIEWED    Imaging Studies  05/03/23 NCV & EMG Findings: Extensive electrodiagnostic testing of the right lower extremity and additional studies of the left shows:  Bilateral sural and left superficial peroneal sensory responses are within normal limits.  Right superficial peroneal sensory response is absent. Bilateral peroneal and left tibial motor responses are within normal limits.  Right tibial motor response is absent. Bilateral tibial H reflex studies are within normal limits. Reduced motor unit recruitment is seen in the right tibialis anterior, gastrocnemius, and biceps femoris short head muscles, without accompanied active  denervation.  Despite maximal activation, no motor unit recruitment is seen in the right flexor digitorum longus muscle.  These findings are not present in the left lower extremity.   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   IMPRESSION  [name] is a 78 y.o. female who I performed a telephone encounter today for evaluation and management of  PLAN  [name] is a pleasant 78 y.o with right foot pain and weakness after a knee replacement in December of 2024. 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 I discussed her EMG results in detail with Dr. Katrinka Blazing who recommended an MRI right thigh with and without  contrast with extension to the knee for further evaluation of a possible lesions. The intention of the MRI is to discuss possible decompression vs peripheral nerve stimulator to help with her symptoms. She was agreeable to this however she had a pacemaker placed in 2021 and believes the leads may not be MRI compatible. I have reached out to both Dr. Mariah Milling, her cardiologist and Dr. Sherryl Manges for confirmation. If she is able to have an MRI we will have this done at [facility]. If she is unable to have an MRI due to her pacemaker, I will discuss further options with Dr. Katrinka Blazing. I will contact her via mychart with the results of these discussion.   No orders of the defined types were placed in this encounter.   DISPOSITION  Follow up: In person appointment with Dr. Katrinka Blazing after right thigh MRI  Susanne Borders, PA Neurosurgery   TELEPHONE DOCUMENTATION   This visit was performed via telephone.  Patient location: home Provider location: office  I spent a total of 5 minutes non-face-to-face activities for this visit on the date of this encounter including review of current clinical condition and response to  treatment.  The patient is aware of and accepts the limits of this telehealth visit.

## 2023-05-16 ENCOUNTER — Telehealth: Payer: Self-pay | Admitting: Neurosurgery

## 2023-05-16 NOTE — Telephone Encounter (Signed)
 Per Alycia Rossetti in the MRI department: "Unsafe generator/lead configuration"

## 2023-05-16 NOTE — Telephone Encounter (Signed)
 I have sent a message to MRI and I am waiting on a response.

## 2023-05-16 NOTE — Telephone Encounter (Signed)
-----   Message from Susanne Borders sent at 05/16/2023  9:19 AM EDT ----- Can you reach out to Dr. Mariah Milling and Cline's offices and try and figure out if her pacemaker is MRI compatible? Dr. Katrinka Blazing wants an MRI of her thigh for possible mass Thank you Danielle ----- Message ----- From: Susanne Borders, PA Sent: 05/16/2023   8:00 AM EDT To: Susanne Borders, PA  F/u on message Dr. Mariah Milling and Dr. Alberteen Spindle about pacemaker compatibility and possible MRI thigh (extend to knee) with and without contrast

## 2023-05-16 NOTE — Telephone Encounter (Signed)
 Patient is calling to follow up on whether or not Duwayne Heck has been able to consult with her cardiologist regarding her being able to get an MRI. Please advise.

## 2023-05-17 ENCOUNTER — Telehealth: Payer: Self-pay | Admitting: Internal Medicine

## 2023-05-17 NOTE — Telephone Encounter (Signed)
 Patient called to advise her device is NOT MRI compatible d/t not complete system. Pt was appreciative of c/b.

## 2023-05-17 NOTE — Telephone Encounter (Signed)
 Pt has a biotronik pacemaker with medtronic leads and wants to know if she can have an MRI

## 2023-05-21 ENCOUNTER — Other Ambulatory Visit: Payer: Self-pay | Admitting: Neurosurgery

## 2023-05-21 DIAGNOSIS — R29898 Other symptoms and signs involving the musculoskeletal system: Secondary | ICD-10-CM

## 2023-05-21 DIAGNOSIS — R200 Anesthesia of skin: Secondary | ICD-10-CM

## 2023-05-21 DIAGNOSIS — R2241 Localized swelling, mass and lump, right lower limb: Secondary | ICD-10-CM

## 2023-05-23 ENCOUNTER — Ambulatory Visit
Admission: RE | Admit: 2023-05-23 | Discharge: 2023-05-23 | Disposition: A | Discharge status: Home / Self Care | Source: Ambulatory Visit | Attending: Neurosurgery | Admitting: Neurosurgery

## 2023-05-23 DIAGNOSIS — R29898 Other symptoms and signs involving the musculoskeletal system: Secondary | ICD-10-CM | POA: Diagnosis present

## 2023-05-23 DIAGNOSIS — R200 Anesthesia of skin: Secondary | ICD-10-CM | POA: Diagnosis present

## 2023-05-23 DIAGNOSIS — R2241 Localized swelling, mass and lump, right lower limb: Secondary | ICD-10-CM | POA: Diagnosis present

## 2023-05-27 NOTE — Progress Notes (Signed)
 Referring Physician:  Noble Bateman, PA [address] [place],  [place] [address]  Primary Physician:  Sari Cunning, MD  History of Present Illness: 05/29/2023 [name] is here today with a chief complaint of right lower extremity symptoms that started with tingling and burning down the back of the leg.  Now is mostly numb with out severe pain and burning.  Starts in the calf and moves down her foot.  It worsens with certain positions.  She does not feel that her movement has had significant improvement.  She did not have a nerve block.  She notices immediate afterwards.  This been going on for little over 3 months.  Conservative measures:  Physical therapy: participated in for her knee recently but not for back.  Multimodal medical therapy including regular antiinflammatories:  Tylenol , Voltaren Gel,Gabapentin , Oxycodone  Injections: By Dr. Erman Hayward  03/14/2023: Right L1-2 and right L4-5 transforaminal ESI 12/05/2022: Right L1-2 and right L4-5 transforaminal ESI (60% relief, [place] location)  06/29/2022: Right L1-2 and right L4-5 transforaminal ESI (over 70% relief) 02/20/2022: Right L1-2 and right L4-5 transforaminal ESI (over 70% relief)  11/14/2021: Right L1-2 and right L4-5 transforaminal ESI (initially 70% relief then sustained greater than 50% relief) 07/20/2021: Right L1-2 and right L4-5 transforaminal ESI (70% relief) 04/18/2021: Right L1-2 and right L5-S1 transforaminal ESI (70% relief) 01/17/2021: Right L1-2 transforaminal ESI (over 60% relief) 11/30/2020: Right L1-2 transforaminal ESI (25% relief) 08/01/2020: Right sacrococcygeal injection (good relief of tailbone pain) 08/10/2019: L2-3 XLIF (Dr. Mont Antis) 04/23/2019: Right L2-3 transforaminal ESI (relief of upper back pain, no relief of L3 radicular symptoms; right L3-4 transforaminal approach was aborted due to tachycardia, lightheadedness, O2 sat of 89%) 03/24/2019: Right sacroiliac  joint injection under ultrasound 01/20/2019: Right L2-3 and right L3-4 transforaminal ESI (moderate to good relief x6 weeks) 12/05/2018: Right L2-3 and right L3-4 transforaminal ESI (moderate relief)    Past Surgery:  Right knee replacement 02/26/23 Dr. Aubry Blase DOS 08/10/19 - L2-3 XLIF, PSF (revision) DOS 08/24/19 - I and D and revision of posterior DOS 09/14/19 - debridement by Dr. Georgi Kinsman has no symptoms of cervical myelopathy.   The symptoms are causing a significant impact on the patient's life.      Review of Systems:  A 10 point review of systems is negative, except for the pertinent positives and negatives detailed in the HPI.  Past Medical History: Past Medical History:  Diagnosis Date   Anemia    Anxiety    Aortic atherosclerosis (HCC)    Arthritis    CAD (coronary artery disease)    a.) LHC 06/11/2007: 30% D1, 30% pRCA - med mgmt; b.) LHC 07/25/2011: 50% mLAD, 50% D1, 50% pRCA - med mgmt; c.) LHC 10/01/2017: 50% p-mLAD, 80% D1 - med mgmt; d.) [facility] 12/26/2021: 50% p-mLAD, 80% oD1-D1. mRA 15, mPA 29, mPWCP 25, AO sat 94%, PA sat 69%, CO 6.0, CI 3.1, LVEDP 20   Chronic back pain    Chronic heart failure with preserved ejection fraction (HFpEF) (HCC)    a.) TTE 07/26/2017: EF 60-65%, no RWMAs, mild LVH, G1DD; b.) TTE 07/17/2017: EF 60-65%, no RWMAs, G2DD, norm RVSF; c.) TTE 09/08/2020: EF 60-65%, no RWMAs, G2DD, norm RVSF; d.) TTE 12/26/2021: EF 55-60%, mild LVH, G2DD, mild MR, AoV sclerosis without stenosis   CKD (chronic kidney disease), stage III (HCC)    Complication of anesthesia 2018   a.) aspirated with supraglottoic airway removal following  wrist surgery   Constipation    DDD (degenerative disc disease), lumbar    a.) s/p BILATERAL laminectomies with decompression and fusion L2-S1; total of 2 surgeries (2017 and 2021) with subsequent development of associated post-operative infections each time   Depression    Diverticulosis    Dysplastic nevus  09/12/2017   L sup buttock - mild    Dysplastic nevus 09/18/2018   R sup med buttocks - mild    E coli infection    Elevated hemidiaphragm (RIGHT)    GERD (gastroesophageal reflux disease)    Headache    Hemorrhoids    History of 2019 novel coronavirus disease (COVID-19) 10/31/2022   History of bilateral cataract extraction    History of colon polyps    History of gout    History of hiatal hernia    History of loop recorder 10/17/2017   a.) LINQ ILR placed; b.) removed 03/04/2019   History of vertigo    Hyperlipidemia    Hypertension    Hypothyroidism    Insomnia    a.) on tamazepam qhs PRN   Left thyroid nodule    Long term current use of aspirin     Lymphedema    NSVT (nonsustained ventricular tachycardia) (HCC)    a.) in setting of acute respiratory failure secondary to AECOPD and CHF; aggressive diuresis + IV steroids led to NSVT + frequent PVCs --> Tx'd with metoprolol  (significant fatigue with higher doses)   OSA on CPAP    Paroxysmal atrial tachycardia (HCC)    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   a.) s/p placement of Biotronik [place] 8 DR-T DC PPM on 03/04/2019   Restless leg syndrome    a.) on ropinirole    Rosacea    Statin intolerance    Symptomatic bradycardia    a.) s/p placement of Biotronik [place] 8 DR-T DC PPM on 03/04/2019   Syncope    a.) documented bradycardia and pauses --> s/p Biotronik [place] 8 DR-T DC PPM placement on 03/04/2019   T2DM (type 2 diabetes mellitus) (HCC)    Varicose veins     Past Surgical History: Past Surgical History:  Procedure Laterality Date   BACK SURGERY     CARPAL TUNNEL RELEASE Bilateral    CATARACT EXTRACTION W/ INTRAOCULAR LENS IMPLANT     CHOLECYSTECTOMY     COLONOSCOPY     COLONOSCOPY WITH PROPOFOL  N/A 11/25/2018   Procedure: COLONOSCOPY WITH PROPOFOL ;  Surgeon: Toledo, Alphonsus Jeans, MD;  Location: [facility];  Service: Gastroenterology;  Laterality: N/A;   DIAGNOSTIC LAPAROSCOPY     multiple times    DILATION AND CURETTAGE OF UTERUS     ESOPHAGOGASTRODUODENOSCOPY (EGD) WITH PROPOFOL  N/A 11/01/2014   Procedure: ESOPHAGOGASTRODUODENOSCOPY (EGD) WITH PROPOFOL ;  Surgeon: Stephens Eis, MD;  Location: [facility];  Service: Gastroenterology;  Laterality: N/A;   ESOPHAGOGASTRODUODENOSCOPY (EGD) WITH PROPOFOL  N/A 11/25/2018   Procedure: ESOPHAGOGASTRODUODENOSCOPY (EGD) WITH PROPOFOL ;  Surgeon: Toledo, Alphonsus Jeans, MD;  Location: [facility];  Service: Gastroenterology;  Laterality: N/A;   ESOPHAGOGASTRODUODENOSCOPY (EGD) WITH PROPOFOL  N/A 07/11/2022   Procedure: ESOPHAGOGASTRODUODENOSCOPY (EGD) WITH PROPOFOL ;  Surgeon: Toledo, Alphonsus Jeans, MD;  Location: [facility];  Service: Gastroenterology;  Laterality: N/A;  IDDM   HARDWARE REMOVAL Left 10/11/2016   Procedure: HARDWARE REMOVAL-LEFT RADIUS;  Surgeon: Jerlyn Moons, MD;  Location: [facility];  Service: Orthopedics;  Laterality: Left;  Left Radius Wrist    KNEE ARTHROPLASTY Right 01/14/2023   Procedure: COMPUTER ASSISTED TOTAL KNEE ARTHROPLASTY;  Surgeon: Arlyne Lame,  MD;  Location: [facility];  Service: Orthopedics;  Laterality: Right;   KNEE ARTHROSCOPY Right 05/09/2020   Procedure: ARTHROSCOPY KNEE;  Surgeon: Arlyne Lame, MD;  Location: [facility];  Service: Orthopedics;  Laterality: Right;   LAMINECTOMY  11/13/2015   LEFT HEART CATH AND CORONARY ANGIOGRAPHY  06/11/2007   Procedure: LEFT HEART CATH AND CORONARY ANGIOGRAPHY; Location: [facility]; Surgeon: Jules Oar, MD   LEFT HEART CATH AND CORONARY ANGIOGRAPHY Left 10/01/2017   Procedure: LEFT HEART CATH AND CORONARY ANGIOGRAPHY;  Surgeon: Sammy Crisp, MD;  Location: [facility];  Service: Cardiovascular;  Laterality: Left;   LEFT HEART CATH AND CORONARY ANGIOGRAPHY Left 07/25/2011   Procedure: LEFT HEART CATH AND CORONARY ANGIOGRAPHY; Location: [facility]; Surgeon: Timpthy Gollan, MD   LOOP RECORDER INSERTION N/A 10/17/2017   Procedure: LOOP RECORDER INSERTION;  Surgeon: Verona Goodwill, MD;  Location: [facility] INVASIVE CV LAB;  Service: Cardiovascular;  Laterality: N/A;   LOOP RECORDER REMOVAL N/A 03/04/2019   Procedure: LOOP RECORDER REMOVAL;  Surgeon: Verona Goodwill, MD;  Location: [facility] INVASIVE CV LAB;  Service: Cardiovascular;  Laterality: N/A;   LUMBAR FUSION  11/2015   LUMBAR WOUND DEBRIDEMENT N/A 12/02/2015   Procedure: WOUND Exploration;  Surgeon: Augusto Blonder, MD;  Location: [facility];  Service: Neurosurgery;  Laterality: N/A;   OPEN REDUCTION INTERNAL FIXATION (ORIF) DISTAL RADIAL FRACTURE Left 08/29/2016   Procedure: OPEN REDUCTION INTERNAL FIXATION (ORIF) DISTAL RADIAL FRACTURE;  Surgeon: Jerlyn Moons, MD;  Location: [facility];  Service: Orthopedics;  Laterality: Left;   PACEMAKER IMPLANT N/A 03/04/2019   Procedure: PACEMAKER IMPLANT;  Surgeon: Verona Goodwill, MD;  Location: [facility] INVASIVE CV LAB;  Service: Cardiovascular;  Laterality: N/A;   PICC LINE PLACE PERIPHERAL ([facility] HX)     right upper arm    RIGHT/LEFT HEART CATH AND CORONARY ANGIOGRAPHY N/A 12/26/2021   Procedure: RIGHT/LEFT HEART CATH AND CORONARY ANGIOGRAPHY;  Surgeon: Sammy Crisp, MD;  Location: [facility] CV LAB;  Service: Cardiovascular;  Laterality: N/A;   ROTATOR CUFF REPAIR Right    SAVORY DILATION N/A 11/01/2014   Procedure: SAVORY DILATION;  Surgeon: Stephens Eis, MD;  Location: [facility] ENDOSCOPY;  Service: Gastroenterology;  Laterality: N/A;   TONSILLECTOMY     TOTAL ABDOMINAL HYSTERECTOMY W/ BILATERAL SALPINGOOPHORECTOMY     TRIGGER FINGER RELEASE Bilateral     Allergies: Allergies as of 05/29/2023 - Review Complete 05/29/2023  Allergen Reaction Noted   Ezetimibe  Diarrhea and Nausea Only 07/19/2017   Hydrocodone -acetaminophen  Other (See Comments) 09/25/2021   Metoclopramide  Diarrhea 07/30/2017   Propoxyphene Other (See Comments) 11/24/2018   Cefuroxime  09/25/2021   Hydrocodone  Other (See Comments) 07/26/2020   Tramadol  Hives 08/08/2018   Ceftin [cefuroxime axetil] Diarrhea  11/04/2015   Codeine Rash 05/05/2010   Penicillins Rash and Other (See Comments) 05/05/2010   Statins Other (See Comments) 06/05/2013   Vicodin [hydrocodone -acetaminophen ] Other (See Comments) 10/23/2013    Medications:  Current Outpatient Medications:    Acetaminophen  (TYLENOL  ARTHRITIS PAIN PO), Take 1,300 mg by mouth every 8 (eight) hours as needed (pain)., Disp: , Rfl:    albuterol  (PROVENTIL ) (2.5 MG/3ML) 0.083% nebulizer solution, Take 3 mLs (2.5 mg total) by nebulization every 4 (four) hours as needed for wheezing or shortness of breath., Disp: 75 mL, Rfl: 12   aspirin  81 MG tablet, Take 1 tablet (81 mg total) by mouth 2 (two) times daily., Disp: , Rfl:    atenolol  (TENORMIN ) 25 MG tablet, Take 12.5 mg by mouth 2 (two)  times daily., Disp: , Rfl:    cholecalciferol  (VITAMIN D3) 25 MCG (1000 UNIT) tablet, Take 2,000 Units by mouth daily., Disp: , Rfl:    Crisaborole  (EUCRISA ) 2 % OINT, Apply 1 Application topically as directed. qd to bid to aa eczema on back as needed for flares, Disp: 180 g, Rfl: 2   desvenlafaxine (PRISTIQ) 25 MG 24 hr tablet, Take 25 mg by mouth daily., Disp: , Rfl:    diclofenac Sodium (VOLTAREN) 1 % GEL, Apply 2 g topically 2 (two) times daily as needed (pain)., Disp: , Rfl:    doxycycline  (PERIOSTAT ) 20 MG tablet, Take 1 tablet (20 mg total) by mouth 2 (two) times daily., Disp: 180 tablet, Rfl: 1   empagliflozin  (JARDIANCE ) 10 MG TABS tablet, Take 10 mg by mouth daily., Disp: , Rfl:    esomeprazole (NEXIUM) 40 MG capsule, Take 40 mg by mouth 2 (two) times daily., Disp: , Rfl:    etodolac  (LODINE ) 400 MG tablet, Take 400 mg by mouth 2 (two) times daily., Disp: , Rfl:    gabapentin  (NEURONTIN ) 300 MG capsule, Take 300 mg by mouth 2 (two) times daily., Disp: , Rfl:    Insulin  Aspart (NOVOLOG  FLEXPEN [place]), Inject 10 Units into the skin daily as needed (when eating a high sugar meal)., Disp: , Rfl:    LANTUS  SOLOSTAR 100 UNIT/ML Solostar Pen, Inject 22 Units into the  skin every morning., Disp: , Rfl:    levothyroxine  (SYNTHROID , LEVOTHROID) 75 MCG tablet, Take 75 mcg by mouth daily before breakfast. , Disp: , Rfl:    metolazone  (ZAROXOLYN ) 2.5 MG tablet, Take 2.5 mg by mouth daily as needed (fluid overload). Weight over 215 lb, Disp: , Rfl:    mometasone  (ELOCON ) 0.1 % cream, Apply 1 application topically daily as needed (Rash). Qd to bid up to 5 days a week aa right lower leg prn itching, Disp: 45 g, Rfl: 1   nitroGLYCERIN  (NITROSTAT ) 0.4 MG SL tablet, Place 1 tablet (0.4 mg total) under the tongue every 5 (five) minutes as needed for chest pain., Disp: 25 tablet, Rfl: 3   NON FORMULARY, Apply 1 application  topically 2 (two) times daily as needed. Azelaic acid /metronidazole /ivermectin  15%/1%/1% Apply to face 1-2 daily as needed for rosacea, Disp: , Rfl:    ondansetron  (ZOFRAN ) 4 MG tablet, Take 4 mg by mouth every 8 (eight) hours as needed for nausea or vomiting. , Disp: , Rfl:    potassium chloride  (KLOR-CON ) 10 MEQ tablet, Take 3 tablets (30 mEq total) by mouth 2 (two) times daily., Disp: 540 tablet, Rfl: 3   PRALUENT  150 MG/ML SOAJ, INJECT 150 MG UNDER THE SKIN EVERY 14 DAYS, Disp: 6 mL, Rfl: 3   ranolazine  (RANEXA ) 500 MG 12 hr tablet, Take 500 mg by mouth 2 (two) times daily., Disp: , Rfl:    ropinirole  (REQUIP ) 5 MG tablet, Take 5 mg by mouth every evening., Disp: , Rfl:    Semaglutide,0.25 or 0.5MG /DOS, (OZEMPIC, 0.25 OR 0.5 MG/DOSE,) 2 MG/1.5ML SOPN, Inject 0.25 mg into the skin once a week., Disp: , Rfl:    temazepam  (RESTORIL ) 30 MG capsule, Take 30 mg by mouth at bedtime., Disp: , Rfl:    torsemide  (DEMADEX ) 20 MG tablet, Take 2 tablets (40 mg total) by mouth daily. May take an additional 1-2 tablets (20 MG-40 MG) as needed for weight gain or swelling., Disp: 180 tablet, Rfl: 1   trolamine salicylate (ASPERCREME) 10 % cream, Apply 1 application topically at bedtime., Disp: , Rfl:  albuterol  (VENTOLIN  HFA) 108 (90 Base) MCG/ACT inhaler, Inhale 2  puffs into the lungs every 6 (six) hours as needed., Disp: , Rfl:  No current facility-administered medications for this visit.  Facility-Administered Medications Ordered in Other Visits:    dexamethasone  (DECADRON ) injection 10 mg, 10 mg, Intravenous, Once, Jerlyn Moons, MD  Social History: Social History   Tobacco Use   Smoking status: Never   Smokeless tobacco: Never  Vaping Use   Vaping status: Never Used  Substance Use Topics   Alcohol use: No   Drug use: No    Family Medical History: Family History  Problem Relation Age of Onset   Heart disease Mother    Breast cancer Mother 53   Heart disease Father    Heart attack Sister    Heart disease Sister    Heart disease Brother     Physical Examination: Vitals:   05/29/23 1259  BP: 118/62   Activation of her more proximal sciatic musculature including hamstrings +shortthead as well as gastrocnemius. Did not see clear acgtivation of her distal musculature in the intrinsic foot for instance.   Medical Decision Making  Imaging: Narrative & Impression  CLINICAL DATA:  Right thigh mass   EXAM: ULTRASOUND right LOWER EXTREMITY LIMITED   TECHNIQUE: Ultrasound examination of the lower extremity soft tissues was performed in the area of clinical concern.   COMPARISON:  None Available.   FINDINGS: Joint Space: No effusion.   Muscles: Normal.   Tendons: Normal   Other Soft Tissue Structures: Normal.   IMPRESSION: No cystic or solid masses were identified. Small fluid collection anterior suprapatellar region measuring 5.9 x 0.4 x 6.5 cm.     Electronically Signed   By: Fredrich Jefferson M.D.   On: 05/23/2023 18:41    Electrodiagnostics:  [facility]  [address] [place], [address]  [place], [place] [address] Tel: [phone] Fax: [phone] Test Date:  05/03/2023   Patient: [name] DOB: 14-Aug-1945 Physician: Reyna Cava, DO  Sex: Female Height: 5\' 1"  Ref Phys: Anastacio Karvonen, PA  ID#:  [id number]     Technician:      History: This is a 78 year old female referred for right foot weakness and numbness following total knee arthroscopy.   NCV & EMG Findings: Extensive electrodiagnostic testing of the right lower extremity and additional studies of the left shows:  Bilateral sural and left superficial peroneal sensory responses are within normal limits.  Right superficial peroneal sensory response is absent. Bilateral peroneal and left tibial motor responses are within normal limits.  Right tibial motor response is absent. Bilateral tibial H reflex studies are within normal limits. Reduced motor unit recruitment is seen in the right tibialis anterior, gastrocnemius, and biceps femoris short head muscles, without accompanied active denervation.  Despite maximal activation, no motor unit recruitment is seen in the right flexor digitorum longus muscle.  These findings are not present in the left lower extremity.   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     ___________________________ Reyna Cava, DO  I have personally reviewed the images and electrodiagnostics and agree with the above interpretation.  Assessment and Plan: Ms. Pantaleo is a pleasant 78 y.o. female with Pain and weakness in the right lower extremity after total knee arthroplasty.  She had general anesthesia without a nerve block.  Numbness weakness and pain on the bottom of her  foot and arch weakness with plantarflexion.  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  She has had some slight improvement in her motor examination since her EMG, I like to continue to follow  her with serial examinations to see whether or not she has any significant improvement.  The top is decide whether or not we would explore her sciatic nerve.  Plan to see her back in approximately 6 weeks for follow-up.  Thank you for involving me in the care of this patient.   Carroll Clamp MD/MSCR Neurosurgery - Peripheral Nerve Surgery

## 2023-05-29 ENCOUNTER — Ambulatory Visit (INDEPENDENT_AMBULATORY_CARE_PROVIDER_SITE_OTHER): Payer: Medicare Other

## 2023-05-29 ENCOUNTER — Ambulatory Visit: Admitting: Neurosurgery

## 2023-05-29 ENCOUNTER — Encounter: Payer: Self-pay | Admitting: Neurosurgery

## 2023-05-29 VITALS — BP 118/62 | Ht 61.0 in | Wt 191.0 lb

## 2023-05-29 DIAGNOSIS — R29898 Other symptoms and signs involving the musculoskeletal system: Secondary | ICD-10-CM | POA: Diagnosis not present

## 2023-05-29 DIAGNOSIS — R55 Syncope and collapse: Secondary | ICD-10-CM | POA: Diagnosis not present

## 2023-05-30 LAB — CUP PACEART REMOTE DEVICE CHECK
Date Time Interrogation Session: 20250416083422
Implantable Lead Connection Status: 753985
Implantable Lead Connection Status: 753985
Implantable Lead Implant Date: 20210120
Implantable Lead Implant Date: 20210120
Implantable Lead Location: 753859
Implantable Lead Location: 753860
Implantable Lead Model: 5076
Implantable Lead Model: 5076
Implantable Pulse Generator Implant Date: 20210120
Pulse Gen Model: 407145
Pulse Gen Serial Number: 69765687

## 2023-06-06 ENCOUNTER — Encounter: Payer: Self-pay | Admitting: Internal Medicine

## 2023-06-11 ENCOUNTER — Ambulatory Visit: Admitting: Internal Medicine

## 2023-07-10 NOTE — Progress Notes (Signed)
 Remote pacemaker transmission.

## 2023-07-10 NOTE — Addendum Note (Signed)
 Addended by: Lott Rouleau A on: 07/10/2023 03:25 PM   Modules accepted: Orders

## 2023-07-15 ENCOUNTER — Ambulatory Visit (INDEPENDENT_AMBULATORY_CARE_PROVIDER_SITE_OTHER): Admitting: Neurosurgery

## 2023-07-15 ENCOUNTER — Encounter: Payer: Self-pay | Admitting: Neurosurgery

## 2023-07-15 VITALS — BP 126/62 | Ht 61.0 in | Wt 191.0 lb

## 2023-07-15 DIAGNOSIS — G5701 Lesion of sciatic nerve, right lower limb: Secondary | ICD-10-CM | POA: Insufficient documentation

## 2023-07-15 DIAGNOSIS — Z981 Arthrodesis status: Secondary | ICD-10-CM | POA: Diagnosis not present

## 2023-07-15 NOTE — Progress Notes (Deleted)
 Back pain now sometimes radiations down bilateral llower extremities.   Have not done recent xrays.   Getting some proximal injections, which does work. Eases off for a bit.

## 2023-07-15 NOTE — Progress Notes (Signed)
 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  She states that she continues to have some improvement with her function.  Has noticed that her toes have started to move.  She has previously having a significant amount of neuropathic pain localized to that side, however has since developed back pain radiating bilateral lower extremities right worse than left.  This often radiates down to her anterior thigh.  She has had multiple injections with Dr. Erman Hayward recently who has been helping with her pain management.  She does get improvement with these injections.    Conservative measures:  Physical therapy: participated in for her knee recently but not for back.  Multimodal medical therapy including regular antiinflammatories:  Tylenol , Voltaren Gel,Gabapentin , Oxycodone  Injections: By Dr. Erman Hayward  03/14/2023: Right L1-2 and right L4-5 transforaminal ESI 12/05/2022: Right L1-2 and right L4-5 transforaminal ESI (60% relief, [place] location)  06/29/2022: Right L1-2 and right L4-5 transforaminal ESI (over 70% relief) 02/20/2022: Right L1-2 and right L4-5 transforaminal ESI (over 70% relief)  11/14/2021: Right L1-2 and right L4-5 transforaminal ESI (initially 70% relief then sustained greater than 50% relief) 07/20/2021: Right L1-2 and right L4-5 transforaminal ESI (70% relief) 04/18/2021: Right L1-2 and right L5-S1 transforaminal ESI (70% relief) 01/17/2021: Right L1-2 transforaminal ESI (over 60% relief) 11/30/2020: Right L1-2 transforaminal ESI (25% relief) 08/01/2020: Right sacrococcygeal injection (good relief of tailbone pain) 08/10/2019: L2-3 XLIF (Dr. Mont Antis) 04/23/2019: Right L2-3 transforaminal ESI (relief of upper back pain, no relief of L3 radicular  symptoms; right L3-4 transforaminal approach was aborted due to tachycardia, lightheadedness, O2 sat of 89%) 03/24/2019: Right sacroiliac joint injection under ultrasound 01/20/2019: Right L2-3 and right L3-4 transforaminal ESI (moderate to good relief x6 weeks) 12/05/2018: Right L2-3 and right L3-4 transforaminal ESI (moderate relief)    Past Surgery:  Right knee replacement 02/26/23 Dr. Aubry Blase DOS 08/10/19 - L2-3 XLIF, PSF (revision) DOS 08/24/19 - I and D and revision of posterior DOS 09/14/19 - debridement by Dr. Georgi Kinsman has no symptoms of cervical myelopathy.   The symptoms are causing a significant impact on the patient's life.      Review of Systems:  A 10 point review of systems is negative, except for the pertinent positives and negatives detailed in the HPI.  Past Medical History: Past Medical History:  Diagnosis Date   Anemia    Anxiety    Aortic atherosclerosis (HCC)    Arthritis    CAD (coronary artery disease)    a.) LHC 06/11/2007: 30% D1, 30% pRCA - med mgmt; b.) LHC 07/25/2011: 50% mLAD, 50% D1, 50% pRCA - med mgmt; c.) LHC 10/01/2017: 50% p-mLAD, 80% D1 - med mgmt; d.) [facility] 12/26/2021: 50% p-mLAD, 80% oD1-D1. mRA 15, mPA 29, mPWCP 25, AO sat 94%, PA sat 69%, CO 6.0, CI 3.1, LVEDP 20   Chronic back pain    Chronic heart failure with preserved ejection fraction (HFpEF) (HCC)    a.) TTE 07/26/2017: EF 60-65%, no RWMAs, mild LVH, G1DD; b.) TTE 07/17/2017: EF 60-65%, no RWMAs, G2DD, norm RVSF; c.) TTE 09/08/2020: EF 60-65%, no RWMAs, G2DD, norm RVSF; d.) TTE 12/26/2021: EF 55-60%, mild LVH, G2DD, mild MR, AoV sclerosis without stenosis   CKD (chronic kidney disease), stage III (HCC)  Complication of anesthesia 2018   a.) aspirated with supraglottoic airway removal following wrist surgery   Constipation    DDD (degenerative disc disease), lumbar    a.) s/p BILATERAL laminectomies with decompression and fusion L2-S1; total of 2 surgeries (2017 and 2021)  with subsequent development of associated post-operative infections each time   Depression    Diverticulosis    Dysplastic nevus 09/12/2017   L sup buttock - mild    Dysplastic nevus 09/18/2018   R sup med buttocks - mild    E coli infection    Elevated hemidiaphragm (RIGHT)    GERD (gastroesophageal reflux disease)    Headache    Hemorrhoids    History of 2019 novel coronavirus disease (COVID-19) 10/31/2022   History of bilateral cataract extraction    History of colon polyps    History of gout    History of hiatal hernia    History of loop recorder 10/17/2017   a.) LINQ ILR placed; b.) removed 03/04/2019   History of vertigo    Hyperlipidemia    Hypertension    Hypothyroidism    Insomnia    a.) on tamazepam qhs PRN   Left thyroid nodule    Long term current use of aspirin     Lymphedema    NSVT (nonsustained ventricular tachycardia) (HCC)    a.) in setting of acute respiratory failure secondary to AECOPD and CHF; aggressive diuresis + IV steroids led to NSVT + frequent PVCs --> Tx'd with metoprolol  (significant fatigue with higher doses)   OSA on CPAP    Paroxysmal atrial tachycardia (HCC)    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   a.) s/p placement of Biotronik [place] 8 DR-T DC PPM on 03/04/2019   Restless leg syndrome    a.) on ropinirole    Rosacea    Statin intolerance    Symptomatic bradycardia    a.) s/p placement of Biotronik [place] 8 DR-T DC PPM on 03/04/2019   Syncope    a.) documented bradycardia and pauses --> s/p Biotronik [place] 8 DR-T DC PPM placement on 03/04/2019   T2DM (type 2 diabetes mellitus) (HCC)    Varicose veins     Past Surgical History: Past Surgical History:  Procedure Laterality Date   BACK SURGERY     CARPAL TUNNEL RELEASE Bilateral    CATARACT EXTRACTION W/ INTRAOCULAR LENS IMPLANT     CHOLECYSTECTOMY     COLONOSCOPY     COLONOSCOPY WITH PROPOFOL  N/A 11/25/2018   Procedure: COLONOSCOPY WITH PROPOFOL ;  Surgeon: Toledo,  Alphonsus Jeans, MD;  Location: [facility] ENDOSCOPY;  Service: Gastroenterology;  Laterality: N/A;   DIAGNOSTIC LAPAROSCOPY     multiple times   DILATION AND CURETTAGE OF UTERUS     ESOPHAGOGASTRODUODENOSCOPY (EGD) WITH PROPOFOL  N/A 11/01/2014   Procedure: ESOPHAGOGASTRODUODENOSCOPY (EGD) WITH PROPOFOL ;  Surgeon: Stephens Eis, MD;  Location: [facility] ENDOSCOPY;  Service: Gastroenterology;  Laterality: N/A;   ESOPHAGOGASTRODUODENOSCOPY (EGD) WITH PROPOFOL  N/A 11/25/2018   Procedure: ESOPHAGOGASTRODUODENOSCOPY (EGD) WITH PROPOFOL ;  Surgeon: Toledo, Alphonsus Jeans, MD;  Location: [facility] ENDOSCOPY;  Service: Gastroenterology;  Laterality: N/A;   ESOPHAGOGASTRODUODENOSCOPY (EGD) WITH PROPOFOL  N/A 07/11/2022   Procedure: ESOPHAGOGASTRODUODENOSCOPY (EGD) WITH PROPOFOL ;  Surgeon: Toledo, Alphonsus Jeans, MD;  Location: [facility] ENDOSCOPY;  Service: Gastroenterology;  Laterality: N/A;  IDDM   HARDWARE REMOVAL Left 10/11/2016   Procedure: HARDWARE REMOVAL-LEFT RADIUS;  Surgeon: Jerlyn Moons, MD;  Location: [facility] ORS;  Service: Orthopedics;  Laterality: Left;  Left Radius Wrist    KNEE ARTHROPLASTY Right 01/14/2023  Procedure: COMPUTER ASSISTED TOTAL KNEE ARTHROPLASTY;  Surgeon: Arlyne Lame, MD;  Location: [facility] ORS;  Service: Orthopedics;  Laterality: Right;   KNEE ARTHROSCOPY Right 05/09/2020   Procedure: ARTHROSCOPY KNEE;  Surgeon: Arlyne Lame, MD;  Location: [facility] ORS;  Service: Orthopedics;  Laterality: Right;   LAMINECTOMY  11/13/2015   LEFT HEART CATH AND CORONARY ANGIOGRAPHY  06/11/2007   Procedure: LEFT HEART CATH AND CORONARY ANGIOGRAPHY; Location: [facility]; Surgeon: Jules Oar, MD   LEFT HEART CATH AND CORONARY ANGIOGRAPHY Left 10/01/2017   Procedure: LEFT HEART CATH AND CORONARY ANGIOGRAPHY;  Surgeon: Sammy Crisp, MD;  Location: [facility] INVASIVE CV LAB;  Service: Cardiovascular;  Laterality: Left;   LEFT HEART CATH AND CORONARY ANGIOGRAPHY Left 07/25/2011   Procedure: LEFT HEART CATH AND CORONARY ANGIOGRAPHY;  Location: [facility]; Surgeon: Timpthy Gollan, MD   LOOP RECORDER INSERTION N/A 10/17/2017   Procedure: LOOP RECORDER INSERTION;  Surgeon: Verona Goodwill, MD;  Location: [facility] INVASIVE CV LAB;  Service: Cardiovascular;  Laterality: N/A;   LOOP RECORDER REMOVAL N/A 03/04/2019   Procedure: LOOP RECORDER REMOVAL;  Surgeon: Verona Goodwill, MD;  Location: [facility] INVASIVE CV LAB;  Service: Cardiovascular;  Laterality: N/A;   LUMBAR FUSION  11/2015   LUMBAR WOUND DEBRIDEMENT N/A 12/02/2015   Procedure: WOUND Exploration;  Surgeon: Augusto Blonder, MD;  Location: [facility] OR;  Service: Neurosurgery;  Laterality: N/A;   OPEN REDUCTION INTERNAL FIXATION (ORIF) DISTAL RADIAL FRACTURE Left 08/29/2016   Procedure: OPEN REDUCTION INTERNAL FIXATION (ORIF) DISTAL RADIAL FRACTURE;  Surgeon: Jerlyn Moons, MD;  Location: [facility] ORS;  Service: Orthopedics;  Laterality: Left;   PACEMAKER IMPLANT N/A 03/04/2019   Procedure: PACEMAKER IMPLANT;  Surgeon: Verona Goodwill, MD;  Location: [facility] INVASIVE CV LAB;  Service: Cardiovascular;  Laterality: N/A;   PICC LINE PLACE PERIPHERAL ([facility] HX)     right upper arm    RIGHT/LEFT HEART CATH AND CORONARY ANGIOGRAPHY N/A 12/26/2021   Procedure: RIGHT/LEFT HEART CATH AND CORONARY ANGIOGRAPHY;  Surgeon: Sammy Crisp, MD;  Location: [facility] INVASIVE CV LAB;  Service: Cardiovascular;  Laterality: N/A;   ROTATOR CUFF REPAIR Right    SAVORY DILATION N/A 11/01/2014   Procedure: SAVORY DILATION;  Surgeon: Stephens Eis, MD;  Location: [facility] ENDOSCOPY;  Service: Gastroenterology;  Laterality: N/A;   TONSILLECTOMY     TOTAL ABDOMINAL HYSTERECTOMY W/ BILATERAL SALPINGOOPHORECTOMY     TRIGGER FINGER RELEASE Bilateral     Allergies: Allergies as of 07/15/2023 - Review Complete 07/15/2023  Allergen Reaction Noted   Ezetimibe  Diarrhea and Nausea Only 07/19/2017   Hydrocodone -acetaminophen  Other (See Comments) 09/25/2021   Metoclopramide  Diarrhea 07/30/2017   Propoxyphene Other (See Comments) 11/24/2018    Cefuroxime  09/25/2021   Hydrocodone  Other (See Comments) 07/26/2020   Tramadol  Hives 08/08/2018   Ceftin [cefuroxime axetil] Diarrhea 11/04/2015   Codeine Rash 05/05/2010   Penicillins Rash and Other (See Comments) 05/05/2010   Statins Other (See Comments) 06/05/2013   Vicodin [hydrocodone -acetaminophen ] Other (See Comments) 10/23/2013    Medications:  Current Outpatient Medications:    Acetaminophen  (TYLENOL  ARTHRITIS PAIN PO), Take 1,300 mg by mouth every 8 (eight) hours as needed (pain)., Disp: , Rfl:    albuterol  (PROVENTIL ) (2.5 MG/3ML) 0.083% nebulizer solution, Take 3 mLs (2.5 mg total) by nebulization every 4 (four) hours as needed for wheezing or shortness of breath., Disp: 75 mL, Rfl: 12   albuterol  (VENTOLIN  HFA) 108 (90 Base) MCG/ACT inhaler, Inhale 2 puffs into the lungs every 6 (six) hours as needed., Disp: , Rfl:  aspirin  81 MG tablet, Take 1 tablet (81 mg total) by mouth 2 (two) times daily., Disp: , Rfl:    atenolol  (TENORMIN ) 25 MG tablet, Take 12.5 mg by mouth 2 (two) times daily., Disp: , Rfl:    cholecalciferol  (VITAMIN D3) 25 MCG (1000 UNIT) tablet, Take 2,000 Units by mouth daily., Disp: , Rfl:    Crisaborole  (EUCRISA ) 2 % OINT, Apply 1 Application topically as directed. qd to bid to aa eczema on back as needed for flares, Disp: 180 g, Rfl: 2   desvenlafaxine (PRISTIQ) 25 MG 24 hr tablet, Take 25 mg by mouth daily., Disp: , Rfl:    diclofenac Sodium (VOLTAREN) 1 % GEL, Apply 2 g topically 2 (two) times daily as needed (pain)., Disp: , Rfl:    doxycycline  (PERIOSTAT ) 20 MG tablet, Take 1 tablet (20 mg total) by mouth 2 (two) times daily., Disp: 180 tablet, Rfl: 1   empagliflozin  (JARDIANCE ) 10 MG TABS tablet, Take 10 mg by mouth daily., Disp: , Rfl:    esomeprazole (NEXIUM) 40 MG capsule, Take 40 mg by mouth 2 (two) times daily., Disp: , Rfl:    etodolac  (LODINE ) 400 MG tablet, Take 400 mg by mouth 2 (two) times daily., Disp: , Rfl:    gabapentin  (NEURONTIN ) 300  MG capsule, Take 300 mg by mouth 2 (two) times daily., Disp: , Rfl:    Insulin  Aspart (NOVOLOG  FLEXPEN [place]), Inject 10 Units into the skin daily as needed (when eating a high sugar meal)., Disp: , Rfl:    LANTUS  SOLOSTAR 100 UNIT/ML Solostar Pen, Inject 16 Units into the skin every morning., Disp: , Rfl:    levothyroxine  (SYNTHROID , LEVOTHROID) 75 MCG tablet, Take 75 mcg by mouth daily before breakfast. , Disp: , Rfl:    metolazone  (ZAROXOLYN ) 2.5 MG tablet, Take 2.5 mg by mouth daily as needed (fluid overload). Weight over 215 lb, Disp: , Rfl:    mometasone  (ELOCON ) 0.1 % cream, Apply 1 application topically daily as needed (Rash). Qd to bid up to 5 days a week aa right lower leg prn itching, Disp: 45 g, Rfl: 1   nitroGLYCERIN  (NITROSTAT ) 0.4 MG SL tablet, Place 1 tablet (0.4 mg total) under the tongue every 5 (five) minutes as needed for chest pain., Disp: 25 tablet, Rfl: 3   NON FORMULARY, Apply 1 application  topically 2 (two) times daily as needed. Azelaic acid /metronidazole /ivermectin  15%/1%/1% Apply to face 1-2 daily as needed for rosacea, Disp: , Rfl:    ondansetron  (ZOFRAN ) 4 MG tablet, Take 4 mg by mouth every 8 (eight) hours as needed for nausea or vomiting. , Disp: , Rfl:    PRALUENT  150 MG/ML SOAJ, INJECT 150 MG UNDER THE SKIN EVERY 14 DAYS, Disp: 6 mL, Rfl: 3   ranolazine  (RANEXA ) 500 MG 12 hr tablet, Take 500 mg by mouth 2 (two) times daily., Disp: , Rfl:    ropinirole  (REQUIP ) 5 MG tablet, Take 5 mg by mouth every evening., Disp: , Rfl:    Semaglutide,0.25 or 0.5MG /DOS, (OZEMPIC, 0.25 OR 0.5 MG/DOSE,) 2 MG/1.5ML SOPN, Inject 0.5 mg into the skin once a week., Disp: , Rfl:    temazepam  (RESTORIL ) 30 MG capsule, Take 30 mg by mouth at bedtime., Disp: , Rfl:    torsemide  (DEMADEX ) 20 MG tablet, Take 2 tablets (40 mg total) by mouth daily. May take an additional 1-2 tablets (20 MG-40 MG) as needed for weight gain or swelling., Disp: 180 tablet, Rfl: 1   trolamine salicylate (ASPERCREME) 10  %  cream, Apply 1 application topically at bedtime., Disp: , Rfl:    potassium chloride  (KLOR-CON ) 10 MEQ tablet, Take 3 tablets (30 mEq total) by mouth 2 (two) times daily., Disp: 540 tablet, Rfl: 3 No current facility-administered medications for this visit.  Facility-Administered Medications Ordered in Other Visits:    dexamethasone  (DECADRON ) injection 10 mg, 10 mg, Intravenous, Once, Jerlyn Moons, MD  Social History: Social History   Tobacco Use   Smoking status: Never   Smokeless tobacco: Never  Vaping Use   Vaping status: Never Used  Substance Use Topics   Alcohol use: No   Drug use: No    Family Medical History: Family History  Problem Relation Age of Onset   Heart disease Mother    Breast cancer Mother 54   Heart disease Father    Heart attack Sister    Heart disease Sister    Heart disease Brother     Physical Examination: Vitals:   07/15/23 0937  BP: 126/62   Significant movement noted in her tibialis anterior and EHL function, as well as in her gastric medius function.  She starting to show some intrinsic foot strength as well with toe flexion and great toe flexion.  She has not yet developed to the ability to do spreading of the toes, but has continued to improve on each examination.  Medical Decision Making  Imaging: Narrative & Impression  CLINICAL DATA:  Right thigh mass   EXAM: ULTRASOUND right LOWER EXTREMITY LIMITED   TECHNIQUE: Ultrasound examination of the lower extremity soft tissues was performed in the area of clinical concern.   COMPARISON:  None Available.   FINDINGS: Joint Space: No effusion.   Muscles: Normal.   Tendons: Normal   Other Soft Tissue Structures: Normal.   IMPRESSION: No cystic or solid masses were identified. Small fluid collection anterior suprapatellar region measuring 5.9 x 0.4 x 6.5 cm.     Electronically Signed   By: Fredrich Jefferson M.D.   On: 05/23/2023 18:41    Electrodiagnostics:  [facility]  Neurology  [address] [place], [address]  [place], [place] [address] Tel: [phone] Fax: [phone] Test Date:  05/03/2023   Patient: [name] DOB: Jun 13, 1945 Physician: Reyna Cava, DO  Sex: Female Height: 5\' 1"  Ref Phys: Anastacio Karvonen, PA  ID#: [id number]     Technician:      History: This is a 78 year old female referred for right foot weakness and numbness following total knee arthroscopy.   NCV & EMG Findings: Extensive electrodiagnostic testing of the right lower extremity and additional studies of the left shows:  Bilateral sural and left superficial peroneal sensory responses are within normal limits.  Right superficial peroneal sensory response is absent. Bilateral peroneal and left tibial motor responses are within normal limits.  Right tibial motor response is absent. Bilateral tibial H reflex studies are within normal limits. Reduced motor unit recruitment is seen in the right tibialis anterior, gastrocnemius, and biceps femoris short head muscles, without accompanied active denervation.  Despite maximal activation, no motor unit recruitment is seen in the right flexor digitorum longus muscle.  These findings are not present in the left lower extremity.   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     ___________________________ Reyna Cava, DO  I have personally reviewed the images and electrodiagnostics and agree with the above interpretation.  Assessment and Plan:  [name] is a pleasant 78 y.o. female with Pain and weakness in the right lower extremity after total knee arthroplasty.  She had general anesthesia without a nerve block.  Numbness weakness and pain on the bottom of her foot and arch weakness with plantarflexion.  Symptoms were consistent with a sciatic neuropathy and this was supported with EMG  findings of a mild to moderate right sided sciatic mononeuropathy this is evaluated with ultrasound which did not show any mass or compression of the sciatic nerve.  We have been following her with serial examinations.  She continues to have improvement in her sciatic nerve function.  We are now seeing evidence of intrinsic foot function which was not present at all on the last evaluation.  She has also had improvement in her dorsiflexion and plantarflexion signifying overall improvement in her nerve function.  At this point I do not feel she would benefit from a sciatic nerve exploration.  In regards to her chronic back pain we did discuss her back pain radiating down to bilateral lower extremities.  She does have a history of multiple spinal fusions, given her distribution in the L2 or L1 distribution with her previous fusion stopping at the 2 3 junction, do feel that she would benefit from evaluation with x-rays.  She is currently getting injections with Dr. Erman Hayward which we agree with as these are giving her significant amount of relief, we just like to make sure she is not having any evidence of adjacent level disease.  Once these images are done we can review with Dr. Mont Antis.   Thank you for involving me in the care of this patient.   Carroll Clamp MD/MSCR Neurosurgery - Peripheral Nerve Surgery    Spent a total of 30 minutes on this visit evaluating her outside notes, following up on her testing, face-to-face evaluation, physical examination, counseling on her disease process, and documentation.

## 2023-07-16 ENCOUNTER — Ambulatory Visit
Admission: RE | Admit: 2023-07-16 | Discharge: 2023-07-16 | Disposition: A | Discharge status: Home / Self Care | Source: Ambulatory Visit | Attending: Neurosurgery | Admitting: Neurosurgery

## 2023-07-16 ENCOUNTER — Ambulatory Visit
Admission: RE | Admit: 2023-07-16 | Discharge: 2023-07-16 | Disposition: A | Discharge status: Home / Self Care | Attending: Neurosurgery | Admitting: Neurosurgery

## 2023-07-16 DIAGNOSIS — Z981 Arthrodesis status: Secondary | ICD-10-CM

## 2023-07-17 ENCOUNTER — Other Ambulatory Visit: Payer: Self-pay

## 2023-07-17 MED ORDER — RANOLAZINE ER 500 MG PO TB12: 500.0000 mg | ORAL_TABLET | Freq: Two times a day (BID) | ORAL | 3 refills | Status: DC

## 2023-07-25 ENCOUNTER — Ambulatory Visit: Payer: Medicare Other | Admitting: Dermatology

## 2023-08-05 ENCOUNTER — Ambulatory Visit (INDEPENDENT_AMBULATORY_CARE_PROVIDER_SITE_OTHER): Admitting: Dermatology

## 2023-08-05 DIAGNOSIS — L719 Rosacea, unspecified: Secondary | ICD-10-CM

## 2023-08-05 DIAGNOSIS — L209 Atopic dermatitis, unspecified: Secondary | ICD-10-CM | POA: Diagnosis not present

## 2023-08-05 DIAGNOSIS — Z792 Long term (current) use of antibiotics: Secondary | ICD-10-CM

## 2023-08-05 DIAGNOSIS — Z7189 Other specified counseling: Secondary | ICD-10-CM

## 2023-08-05 DIAGNOSIS — T148XXA Other injury of unspecified body region, initial encounter: Secondary | ICD-10-CM | POA: Diagnosis not present

## 2023-08-05 DIAGNOSIS — L299 Pruritus, unspecified: Secondary | ICD-10-CM

## 2023-08-05 DIAGNOSIS — L2089 Other atopic dermatitis: Secondary | ICD-10-CM

## 2023-08-05 DIAGNOSIS — Z79899 Other long term (current) drug therapy: Secondary | ICD-10-CM

## 2023-08-05 MED ORDER — DUPILUMAB 300 MG/2ML ~~LOC~~ SOAJ
600.0000 mg | Freq: Once | SUBCUTANEOUS | Status: AC
Start: 2023-08-05 — End: 2023-08-05
  Administered 2023-08-05: 600 mg via SUBCUTANEOUS

## 2023-08-05 MED ORDER — DOXYCYCLINE HYCLATE 20 MG PO TABS
20.0000 mg | ORAL_TABLET | Freq: Two times a day (BID) | ORAL | 1 refills | Status: DC
Start: 2023-08-05 — End: 2023-08-12

## 2023-08-05 MED ORDER — DUPIXENT 300 MG/2ML ~~LOC~~ SOAJ: 600.0000 mg | Freq: Once | SUBCUTANEOUS | 0 refills | Status: DC

## 2023-08-05 NOTE — Patient Instructions (Addendum)
 Dupilumab (Dupixent) is a treatment given by injection for adults and children with moderate-to-severe atopic dermatitis. Goal is control of skin condition, not cure. It is given as 2 injections at the first dose followed by 1 injection ever 2 weeks thereafter.  Young children are dosed monthly.  Potential side effects include allergic reaction, herpes infections, injection site reactions and conjunctivitis (inflammation of the eyes).  The use of Dupixent requires long term medication management, including periodic office visits.      Gentle Skin Care Guide  1. Bathe no more than once a day.  2. Avoid bathing in hot water  3. Use a mild soap like Dove, Vanicream, Cetaphil, CeraVe. Can use Lever 2000 or Cetaphil antibacterial soap  4. Use soap only where you need it. On most days, use it under your arms, between your legs, and on your feet. Let the water rinse other areas unless visibly dirty.  5. When you get out of the bath/shower, use a towel to gently blot your skin dry, don't rub it.  6. While your skin is still a little damp, apply a moisturizing cream such as Vanicream, CeraVe, Cetaphil, Eucerin, Sarna lotion or plain Vaseline Jelly. For hands apply Neutrogena [place] Hand Cream or Excipial Hand Cream.  7. Reapply moisturizer any time you start to itch or feel dry.  8. Sometimes using free and clear laundry detergents can be helpful. Fabric softener sheets should be avoided. Downy Free & Gentle liquid, or any liquid fabric softener that is free of dyes and perfumes, it acceptable to use  9. If your doctor has given you prescription creams you may apply moisturizers over them        Due to recent changes in healthcare laws, you may see results of your pathology and/or laboratory studies on MyChart before the doctors have had a chance to review them. We understand that in some cases there may be results that are confusing or concerning to you. Please understand that not all  results are received at the same time and often the doctors may need to interpret multiple results in order to provide you with the best plan of care or course of treatment. Therefore, we ask that you please give [place] 2 business days to thoroughly review all your results before contacting the office for clarification. Should we see a critical lab result, you will be contacted sooner.   If You Need Anything After Your Visit  If you have any questions or concerns for your doctor, please call our main line at [phone] and press option 4 to reach your doctor's medical assistant. If no one answers, please leave a voicemail as directed and we will return your call as soon as possible. Messages left after 4 pm will be answered the following business day.   You may also send [place] a message via MyChart. We typically respond to MyChart messages within 1-2 business days.  For prescription refills, please ask your pharmacy to contact our office. Our fax number is [phone].  If you have an urgent issue when the clinic is closed that cannot wait until the next business day, you can page your doctor at the number below.    Please note that while we do our best to be available for urgent issues outside of office hours, we are not available 24/7.   If you have an urgent issue and are unable to reach [place], you may choose to seek medical care at your doctor's office, retail clinic, urgent care  center, or emergency room.  If you have a medical emergency, please immediately call 911 or go to the emergency department.  Pager Numbers  - Dr. Gwen Pounds: [phone]  - Dr. Roseanne Reno: [phone]  - Dr. Katrinka Blazing: [phone]   In the event of inclement weather, please call our main line at [phone] for an update on the status of any delays or closures.  Dermatology Medication Tips: Please keep the boxes that topical medications come in in order to help keep track of the instructions about where and how to use  these. Pharmacies typically print the medication instructions only on the boxes and not directly on the medication tubes.   If your medication is too expensive, please contact our office at [phone] option 4 or send [place] a message through MyChart.   We are unable to tell what your co-pay for medications will be in advance as this is different depending on your insurance coverage. However, we may be able to find a substitute medication at lower cost or fill out paperwork to get insurance to cover a needed medication.   If a prior authorization is required to get your medication covered by your insurance company, please allow [place] 1-2 business days to complete this process.  Drug prices often vary depending on where the prescription is filled and some pharmacies may offer cheaper prices.  The website www.goodrx.com contains coupons for medications through different pharmacies. The prices here do not account for what the cost may be with help from insurance (it may be cheaper with your insurance), but the website can give you the price if you did not use any insurance.  - You can print the associated coupon and take it with your prescription to the pharmacy.  - You may also stop by our office during regular business hours and pick up a GoodRx coupon card.  - If you need your prescription sent electronically to a different pharmacy, notify our office through [facility] or by phone at [phone] option 4.     Si Usted Necesita Algo Despus de Su Visita  Tambin puede enviarnos un mensaje a travs de Clinical cytogeneticist. Por lo general respondemos a los mensajes de MyChart en el transcurso de 1 a 2 das hbiles.  Para renovar recetas, por favor pida a su farmacia que se ponga en contacto con nuestra oficina. Annie Sable de fax es [place] [phone].  Si tiene un asunto urgente cuando la clnica est cerrada y que no puede esperar hasta el siguiente da hbil, puede llamar/localizar a su doctor(a) al  nmero que aparece a continuacin.   Por favor, tenga en cuenta que aunque hacemos todo lo posible para estar disponibles para asuntos urgentes fuera del horario de [place], no estamos disponibles las 24 horas del da, los 7 [address]  [address] de la [place].   Si tiene un problema urgente y no puede comunicarse con nosotros, puede optar por buscar atencin mdica  en el consultorio de su doctor(a), en una clnica privada, en un centro de atencin urgente o en una sala de emergencias.  Si tiene Engineer, drilling, por favor llame inmediatamente al 911 o vaya a la sala de emergencias.  Nmeros de bper  - Dr. Gwen Pounds: [phone]  - Dra. Roseanne Reno: [phone]  - Dr. Katrinka Blazing: [phone]   En caso de inclemencias del tiempo, por favor llame a Lacy Duverney principal al [phone] para una actualizacin sobre el [place] de cualquier retraso o cierre.  Consejos para la medicacin en dermatologa: Por favor, guarde las cajas  en las que vienen los medicamentos de uso tpico para ayudarle a seguir las instrucciones sobre dnde y cmo usarlos. Las farmacias generalmente imprimen las instrucciones del medicamento slo en las cajas y no directamente en los tubos del [place].   Si su medicamento es muy caro, por favor, pngase en contacto con Rolm Gala llamando al [phone] y presione la opcin 4 o envenos un mensaje a travs de Clinical cytogeneticist.   No podemos decirle cul ser su copago por los medicamentos por adelantado ya que esto es diferente dependiendo de la cobertura de su seguro. Sin embargo, es posible que podamos encontrar un medicamento sustituto a Audiological scientist un formulario para que el seguro cubra el medicamento que se considera necesario.   Si se requiere una autorizacin previa para que su compaa de seguros [place] su medicamento, por favor permtanos de 1 a 2 das hbiles para completar [address]  Los precios de los medicamentos varan con frecuencia dependiendo del Environmental consultant de  dnde se surte la receta y alguna farmacias pueden ofrecer precios ms baratos.  El sitio web www.goodrx.com tiene cupones para medicamentos de Health and safety inspector. Los precios aqu no tienen en cuenta lo que podra costar con la ayuda del seguro (puede ser ms barato con su seguro), pero el sitio web puede darle el precio si no utiliz Tourist information centre manager.  - Puede imprimir el cupn correspondiente y llevarlo con su receta a la farmacia.  - Tambin puede pasar por nuestra oficina durante el horario de atencin regular y Education officer, museum una tarjeta de cupones de GoodRx.  - Si necesita que su receta se enve electrnicamente a una farmacia diferente, informe a nuestra oficina a travs de MyChart de [place] o por telfono llamando al [phone] y presione la opcin 4.

## 2023-08-05 NOTE — Progress Notes (Unsigned)
 Follow-Up Visit   Subjective  [name] is a 78 y.o. female who presents for the following: 6 month rosacea Follow up on doxycycline  40 mg daily, skin medicinals triple cream feels has improved with bumps at face. Also follow up on eczema back. Reports eucrisa  ointment has helped with itch and scale.   The patient has spots, moles and lesions to be evaluated, some may be new or changing and the patient may have concern these could be cancer.  The following portions of the chart were reviewed this encounter and updated as appropriate: medications, allergies, medical history  Review of Systems:  No other skin or systemic complaints except as noted in HPI or Assessment and Plan.  Objective  Well appearing patient in no apparent distress; mood and affect are within normal limits.  A focused examination was performed of the following areas: face  Relevant exam findings are noted in the Assessment and Plan.       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 Condition is improving with treatment but not currently at goal.  Rosacea is a chronic progressive skin condition usually affecting the face of adults, causing redness and/or acne bumps. It is treatable but not curable. It sometimes affects the eyes (ocular rosacea) as well. It may respond to topical and/or systemic medication and can flare with stress, sun exposure, alcohol, exercise, topical steroids (including hydrocortisone/cortisone 10) and some foods.  Daily application of broad spectrum spf 30+ sunscreen to face is recommended to reduce flares. Treatment Plan Cont Doxycyline 40 mg total - patient taking 20 mg bid   Doxycycline  should be taken with food to prevent nausea. Do not lay down for 30 minutes after taking. Be cautious with sun exposure and use good sun protection while on this medication. Pregnant women  should not take this medication.   Continue SM Triple Cream qd/bid Long term medication management.  Patient is using long term (months to years) prescription medication  to control their dermatologic condition.  These medications require periodic monitoring to evaluate for efficacy and side effects and may require periodic laboratory monitoring.     ATOPIC DERMATITIS back Exam: excoriations back with scale - see photos  5% BSA Chronic and persistent condition with duration or expected duration over one year. Condition is symptomatic/ bothersome to patient. Not currently at goal.  Atopic dermatitis (eczema) is a chronic, relapsing, pruritic condition that can significantly affect quality of life. It is often associated with allergic rhinitis and/or asthma and can require treatment with topical medications, phototherapy, or in severe cases biologic injectable medication (Dupixent; Adbry) or Oral JAK inhibitors. Treatment Plan: Continue Eucrisa  ointment qd/bid to aa eczema   Discussed dupixent injections.   Will start injections today and recheck in 2 weeks. If patient responds well will continue treatment and submit through patient's insurance.   Patient injected with Dupixent 600 mg total. 1 injection 300 mg/2 ml pen injected into right and left abdomen. Patient tolerated well with no adverse reactions  NDC 9975-4084-79 Lot: [id number] Exp : 12/12/2024   Dupilumab (Dupixent) is a treatment given by injection for adults and children with moderate-to-severe atopic dermatitis. Goal is control of skin condition, not cure. It is given as 2 injections at the first dose followed by 1 injection ever 2 weeks thereafter.  Young children are dosed monthly.  Potential side effects include allergic reaction, herpes infections,  injection site reactions and conjunctivitis (inflammation of the eyes).  The use of Dupixent requires long term medication management, including periodic office visits.     Recommend gentle skin care.     OTHER ATOPIC DERMATITIS   Related Medications Dupilumab SOAJ 600 mg  ROSACEA   Related Medications doxycycline  (PERIOSTAT ) 20 MG tablet Take 1 tablet (20 mg total) by mouth 2 (two) times daily.  Return for 2 week dupixent shot and followup, schedule jan 2026 - feb 2026 for atopic and rosacea follow up.  IEleanor Blush, CMA, am acting as scribe for Alm Rhyme, MD.   Documentation: I have reviewed the above documentation for accuracy and completeness, and I agree with the above.  Alm Rhyme, MD

## 2023-08-06 ENCOUNTER — Encounter: Payer: Self-pay | Admitting: Dermatology

## 2023-08-11 ENCOUNTER — Other Ambulatory Visit: Payer: Self-pay | Admitting: Dermatology

## 2023-08-11 DIAGNOSIS — L719 Rosacea, unspecified: Secondary | ICD-10-CM

## 2023-08-20 ENCOUNTER — Ambulatory Visit: Admitting: Dermatology

## 2023-08-20 ENCOUNTER — Encounter: Payer: Self-pay | Admitting: Dermatology

## 2023-08-20 DIAGNOSIS — L209 Atopic dermatitis, unspecified: Secondary | ICD-10-CM

## 2023-08-20 DIAGNOSIS — Z79899 Other long term (current) drug therapy: Secondary | ICD-10-CM | POA: Diagnosis not present

## 2023-08-20 DIAGNOSIS — Z7189 Other specified counseling: Secondary | ICD-10-CM

## 2023-08-20 DIAGNOSIS — L2089 Other atopic dermatitis: Secondary | ICD-10-CM

## 2023-08-20 MED ORDER — DUPILUMAB 300 MG/2ML ~~LOC~~ SOAJ
300.0000 mg | Freq: Once | SUBCUTANEOUS | Status: AC
  Administered 2023-08-20: 300 mg via SUBCUTANEOUS

## 2023-08-20 MED ORDER — DUPIXENT 300 MG/2ML ~~LOC~~ SOAJ: 300.0000 mg | SUBCUTANEOUS | 6 refills | Status: DC

## 2023-08-20 NOTE — Progress Notes (Signed)
   Follow-Up Visit   Subjective  [name] is a 78 y.o. female who presents for the following: 2 week dupixent  injection and follow up on atopic dermatitis at back. Patient feels since she started injection 2 weeks ago rash has improved and she is less itchy  The following portions of the chart were reviewed this encounter and updated as appropriate: medications, allergies, medical history  Review of Systems:  No other skin or systemic complaints except as noted in HPI or Assessment and Plan.  Objective  Well appearing patient in no apparent distress; mood and affect are within normal limits.  A focused examination was performed of the following areas: back  Relevant exam findings are noted in the Assessment and Plan.    Assessment & Plan   ATOPIC DERMATITIS back Exam: excoriations back with scale - see photos improved itch and rash  Hx of years  5% BSA Chronic and persistent condition with duration or expected duration over one year. Condition is symptomatic/ bothersome to patient. Not currently at goal.  Atopic dermatitis (eczema) is a chronic, relapsing, pruritic condition that can significantly affect quality of life. It is often associated with allergic rhinitis and/or asthma and can require treatment with topical medications, phototherapy, or in severe cases biologic injectable medication (Dupixent ; Adbry) or Oral JAK inhibitors. Treatment Plan: Continue Eucrisa  ointment qd/bid to aa eczema   Will continue dupixent  injections biweekly pending insurance approval   Will submit Dupixent  [place] Form  [facility]  Patient is interested in doing her own injections if approved by insurance. Prefers pen.    Patient injected with Dupixent  300 mg total. 1 injection 300 mg/2 ml syringe  injected into right lower abdomen. Patient tolerated well with no adverse reactions  NDC 9975-4085-97 Lot: [id number] Exp : 07/2025   Dupilumab  (Dupixent ) is a treatment given by  injection for adults and children with moderate-to-severe atopic dermatitis. Goal is control of skin condition, not cure. It is given as 2 injections at the first dose followed by 1 injection ever 2 weeks thereafter.  Young children are dosed monthly.   Potential side effects include allergic reaction, herpes infections, injection site reactions and conjunctivitis (inflammation of the eyes).  The use of Dupixent  requires long term medication management, including periodic office visits.     Recommend gentle skin care.   OTHER ATOPIC DERMATITIS   Related Medications Dupilumab  SOAJ 300 mg  Dupilumab  (DUPIXENT ) 300 MG/2ML SOAJ Inject 300 mg into the skin every 14 (fourteen) days. Starting at day 15 for maintenance.  Return for 2 week dupixent  shot and follow up.  IEleanor Blush, CMA, am acting as scribe for Alm Rhyme, MD.   Documentation: I have reviewed the above documentation for accuracy and completeness, and I agree with the above.  Alm Rhyme, MD

## 2023-08-20 NOTE — Patient Instructions (Signed)

## 2023-08-21 ENCOUNTER — Other Ambulatory Visit: Payer: Self-pay

## 2023-08-23 ENCOUNTER — Encounter: Payer: Self-pay | Admitting: Cardiology

## 2023-08-23 ENCOUNTER — Ambulatory Visit: Attending: Cardiology | Admitting: Cardiology

## 2023-08-23 VITALS — BP 124/58 | HR 85 | Ht 61.0 in | Wt 189.8 lb

## 2023-08-23 DIAGNOSIS — G4733 Obstructive sleep apnea (adult) (pediatric): Secondary | ICD-10-CM | POA: Diagnosis present

## 2023-08-23 DIAGNOSIS — E119 Type 2 diabetes mellitus without complications: Secondary | ICD-10-CM | POA: Diagnosis present

## 2023-08-23 DIAGNOSIS — I10 Essential (primary) hypertension: Secondary | ICD-10-CM | POA: Diagnosis present

## 2023-08-23 DIAGNOSIS — R0789 Other chest pain: Secondary | ICD-10-CM | POA: Insufficient documentation

## 2023-08-23 DIAGNOSIS — Z794 Long term (current) use of insulin: Secondary | ICD-10-CM | POA: Insufficient documentation

## 2023-08-23 DIAGNOSIS — I5032 Chronic diastolic (congestive) heart failure: Secondary | ICD-10-CM | POA: Diagnosis present

## 2023-08-23 DIAGNOSIS — I4719 Other supraventricular tachycardia: Secondary | ICD-10-CM | POA: Insufficient documentation

## 2023-08-23 DIAGNOSIS — E785 Hyperlipidemia, unspecified: Secondary | ICD-10-CM | POA: Insufficient documentation

## 2023-08-23 DIAGNOSIS — I25118 Atherosclerotic heart disease of native coronary artery with other forms of angina pectoris: Secondary | ICD-10-CM | POA: Insufficient documentation

## 2023-08-23 NOTE — Progress Notes (Unsigned)
 Referring Physician:  Cleotilde Oneil FALCON, MD [address] [facility] [address] [facility] [place],  [place] [address]  Primary Physician:  Cleotilde Oneil FALCON, MD  History of Present Illness: 08/26/2023 Ms. Kumiko Sandoval has a history of osteoporosis, CHF, CKD stage 3, CAD, DM, GERD, HTN, obesity. TLS, OSA with CPAP.   She has a pacemaker.   She has history of revision PSF/XLIF L2-L3 with Dr. Clois on 08/10/19.   Last seen by Dr. Claudene on 07/15/23 for right sciatic neuropathy s/p right TKA. No compression seen on EMG. He did not recommend further surgery.   He did recommend repeat lumbar xrays to evaluate for adjacent level disease. She has also been seeing Dr. Avanell for injections.   She is here for follow up.   She's had chronic intermittent back and bilateral leg pain (entire leg) to her feet that is worse with standing/walking and better with sitting. Now with a 6 week history of constant right sided LBP that radiates to mid back and down lateral right leg to her foot and right groin. No known injury, but she does have a new puppy and has been bending more. Pain is worse with walking, moving, and sleeping. No alleviating factors. When pain is severe, she feels weakness in her legs. Some numbness noted in right leg that is chronic.   She is taking lodine  and neurontin . She increased neurontin  to tid.   She had about a month relief with last ESI.   Last HgbA1c on 06/10/23 was 5.5.   Tobacco use: Does not smoke.   Bowel/Bladder Dysfunction: bladder incontinence and urgency x 1 year at least. No bowel issues. No perineal numbness.   Conservative measures:  Physical therapy: participated in for her knee recently but not for back.  Multimodal medical therapy including regular antiinflammatories:  Tylenol , Voltaren Gel,Gabapentin , Oxycodone  Injections:  06/27/2023: Right L1-2 and right L4-5 transforaminal ESI (60% relief)  03/14/2023: Right L1-2 and right L4-5 transforaminal  ESI 12/05/2022: Right L1-2 and right L4-5 transforaminal ESI (60% relief, [place] location)  06/29/2022: Right L1-2 and right L4-5 transforaminal ESI (over 70% relief) 02/20/2022: Right L1-2 and right L4-5 transforaminal ESI (over 70% relief)  11/14/2021: Right L1-2 and right L4-5 transforaminal ESI (initially 70% relief then sustained greater than 50% relief) 07/20/2021: Right L1-2 and right L4-5 transforaminal ESI (70% relief) 04/18/2021: Right L1-2 and right L5-S1 transforaminal ESI (70% relief) 01/17/2021: Right L1-2 transforaminal ESI (over 60% relief) 11/30/2020: Right L1-2 transforaminal ESI (25% relief) 08/01/2020: Right sacrococcygeal injection (good relief of tailbone pain) 08/10/2019: L2-3 XLIF (Dr. Clois) 04/23/2019: Right L2-3 transforaminal ESI (relief of upper back pain, no relief of L3 radicular symptoms; right L3-4 transforaminal approach was aborted due to tachycardia, lightheadedness, O2 sat of 89%) 03/24/2019: Right sacroiliac joint injection under ultrasound 01/20/2019: Right L2-3 and right L3-4 transforaminal ESI (moderate to good relief x6 weeks) 12/05/2018: Right L2-3 and right L3-4 transforaminal ESI (moderate relief)    Past Surgery:  Right knee replacement 02/26/23 Dr. Mardee DOS 08/10/19 - L2-3 XLIF, PSF (revision) DOS 08/24/19 - I and D and revision of posterior DOS 09/14/19 - debridement by Dr. Delores  The symptoms are causing a significant impact on the patient's life.   Review of Systems:  A 10 point review of systems is negative, except for the pertinent positives and negatives detailed in the HPI.  Past Medical History: Past Medical History:  Diagnosis Date   Anemia    Anxiety    Aortic atherosclerosis (HCC)  Arthritis    CAD (coronary artery disease)    a.) LHC 06/11/2007: 30% D1, 30% pRCA - med mgmt; b.) LHC 07/25/2011: 50% mLAD, 50% D1, 50% pRCA - med mgmt; c.) LHC 10/01/2017: 50% p-mLAD, 80% D1 - med mgmt; d.) R/LHC 12/26/2021: 50% p-mLAD, 80%  oD1-D1. mRA 15, mPA 29, mPWCP 25, AO sat 94%, PA sat 69%, CO 6.0, CI 3.1, LVEDP 20   Chronic back pain    Chronic heart failure with preserved ejection fraction (HFpEF) (HCC)    a.) TTE 07/26/2017: EF 60-65%, no RWMAs, mild LVH, G1DD; b.) TTE 07/17/2017: EF 60-65%, no RWMAs, G2DD, norm RVSF; c.) TTE 09/08/2020: EF 60-65%, no RWMAs, G2DD, norm RVSF; d.) TTE 12/26/2021: EF 55-60%, mild LVH, G2DD, mild MR, AoV sclerosis without stenosis   CKD (chronic kidney disease), stage III (HCC)    Complication of anesthesia 2018   a.) aspirated with supraglottoic airway removal following wrist surgery   Constipation    DDD (degenerative disc disease), lumbar    a.) s/p BILATERAL laminectomies with decompression and fusion L2-S1; total of 2 surgeries (2017 and 2021) with subsequent development of associated post-operative infections each time   Depression    Diverticulosis    Dysplastic nevus 09/12/2017   L sup buttock - mild    Dysplastic nevus 09/18/2018   R sup med buttocks - mild    E coli infection    Elevated hemidiaphragm (RIGHT)    GERD (gastroesophageal reflux disease)    Headache    Hemorrhoids    History of 2019 novel coronavirus disease (COVID-19) 10/31/2022   History of bilateral cataract extraction    History of colon polyps    History of gout    History of hiatal hernia    History of loop recorder 10/17/2017   a.) LINQ ILR placed; b.) removed 03/04/2019   History of vertigo    Hyperlipidemia    Hypertension    Hypothyroidism    Insomnia    a.) on tamazepam qhs PRN   Left thyroid nodule    Long term current use of aspirin     Lymphedema    NSVT (nonsustained ventricular tachycardia) (HCC)    a.) in setting of acute respiratory failure secondary to AECOPD and CHF; aggressive diuresis + IV steroids led to NSVT + frequent PVCs --> Tx'd with metoprolol  (significant fatigue with higher doses)   OSA on CPAP    Paroxysmal atrial tachycardia (HCC)    Presence of permanent cardiac  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   a.) s/p placement of Biotronik [place] 8 DR-T DC PPM on 03/04/2019   Restless leg syndrome    a.) on ropinirole    Rosacea    Statin intolerance    Symptomatic bradycardia    a.) s/p placement of Biotronik [place] 8 DR-T DC PPM on 03/04/2019   Syncope    a.) documented bradycardia and pauses --> s/p Biotronik [place] 8 DR-T DC PPM placement on 03/04/2019   T2DM (type 2 diabetes mellitus) (HCC)    Varicose veins     Past Surgical History: Past Surgical History:  Procedure Laterality Date   BACK SURGERY     CARPAL TUNNEL RELEASE Bilateral    CATARACT EXTRACTION W/ INTRAOCULAR LENS IMPLANT     CHOLECYSTECTOMY     COLONOSCOPY     COLONOSCOPY WITH PROPOFOL  N/A 11/25/2018   Procedure: COLONOSCOPY WITH PROPOFOL ;  Surgeon: Toledo, Ladell POUR, MD;  Location: [facility];  Service: Gastroenterology;  Laterality: N/A;   DIAGNOSTIC LAPAROSCOPY     multiple  times   DILATION AND CURETTAGE OF UTERUS     ESOPHAGOGASTRODUODENOSCOPY (EGD) WITH PROPOFOL  N/A 11/01/2014   Procedure: ESOPHAGOGASTRODUODENOSCOPY (EGD) WITH PROPOFOL ;  Surgeon: Deward CINDERELLA Piedmont, MD;  Location: [facility];  Service: Gastroenterology;  Laterality: N/A;   ESOPHAGOGASTRODUODENOSCOPY (EGD) WITH PROPOFOL  N/A 11/25/2018   Procedure: ESOPHAGOGASTRODUODENOSCOPY (EGD) WITH PROPOFOL ;  Surgeon: Toledo, Ladell POUR, MD;  Location: [facility];  Service: Gastroenterology;  Laterality: N/A;   ESOPHAGOGASTRODUODENOSCOPY (EGD) WITH PROPOFOL  N/A 07/11/2022   Procedure: ESOPHAGOGASTRODUODENOSCOPY (EGD) WITH PROPOFOL ;  Surgeon: Toledo, Ladell POUR, MD;  Location: [facility];  Service: Gastroenterology;  Laterality: N/A;  IDDM   HARDWARE REMOVAL Left 10/11/2016   Procedure: HARDWARE REMOVAL-LEFT RADIUS;  Surgeon: Leora Lynwood SAUNDERS, MD;  Location: [facility] OR[facility];  Service: Orthopedics;  Laterality: Left;  Left Radius Wrist    KNEE ARTHROPLASTY Right 01/14/2023   Procedure: COMPUTER ASSISTED TOTAL KNEE ARTHROPLASTY;  Surgeon: Mardee Lynwood SQUIBB, MD;  Location: [facility];  Service: Orthopedics;  Laterality: Right;   KNEE ARTHROSCOPY Right 05/09/2020   Procedure: ARTHROSCOPY KNEE;  Surgeon: Mardee Lynwood SQUIBB, MD;  Location: [facility];  Service: Orthopedics;  Laterality: Right;   LAMINECTOMY  11/13/2015   LEFT HEART CATH AND CORONARY ANGIOGRAPHY  06/11/2007   Procedure: LEFT HEART CATH AND CORONARY ANGIOGRAPHY; Location: [facility]; Surgeon: Toribio Fuel, MD   LEFT HEART CATH AND CORONARY ANGIOGRAPHY Left 10/01/2017   Procedure: LEFT HEART CATH AND CORONARY ANGIOGRAPHY;  Surgeon: Mady Bruckner, MD;  Location: [facility];  Service: Cardiovascular;  Laterality: Left;   LEFT HEART CATH AND CORONARY ANGIOGRAPHY Left 07/25/2011   Procedure: LEFT HEART CATH AND CORONARY ANGIOGRAPHY; Location: [facility]; Surgeon: Timpthy Gollan, MD   LOOP RECORDER INSERTION N/A 10/17/2017   Procedure: LOOP RECORDER INSERTION;  Surgeon: Fernande Elspeth BROCKS, MD;  Location: [facility] INVASIVE CV LAB;  Service: Cardiovascular;  Laterality: N/A;   LOOP RECORDER REMOVAL N/A 03/04/2019   Procedure: LOOP RECORDER REMOVAL;  Surgeon: Fernande Elspeth BROCKS, MD;  Location: [facility] INVASIVE CV LAB;  Service: Cardiovascular;  Laterality: N/A;   LUMBAR FUSION  11/2015   LUMBAR WOUND DEBRIDEMENT N/A 12/02/2015   Procedure: WOUND Exploration;  Surgeon: Gerldine Maizes, MD;  Location: [facility] OR;  Service: Neurosurgery;  Laterality: N/A;   OPEN REDUCTION INTERNAL FIXATION (ORIF) DISTAL RADIAL FRACTURE Left 08/29/2016   Procedure: OPEN REDUCTION INTERNAL FIXATION (ORIF) DISTAL RADIAL FRACTURE;  Surgeon: Leora Lynwood SAUNDERS, MD;  Location: [facility];  Service: Orthopedics;  Laterality: Left;   PACEMAKER IMPLANT N/A 03/04/2019   Procedure: PACEMAKER IMPLANT;  Surgeon: Fernande Elspeth BROCKS, MD;  Location: [facility] INVASIVE CV LAB;  Service: Cardiovascular;  Laterality: N/A;   PICC LINE PLACE PERIPHERAL ([facility] HX)     right upper arm    RIGHT/LEFT HEART CATH AND CORONARY ANGIOGRAPHY N/A 12/26/2021   Procedure:  RIGHT/LEFT HEART CATH AND CORONARY ANGIOGRAPHY;  Surgeon: Mady Bruckner, MD;  Location: [facility] IN[facility] CV LAB;  Service: Cardiovascular;  Laterality: N/A;   ROTATOR CUFF REPAIR Right    SAVORY DILATION N/A 11/01/2014   Procedure: SAVORY DILATION;  Surgeon: Deward CINDERELLA Piedmont, MD;  Location: [facility];  Service: Gastroenterology;  Laterality: N/A;   TONSILLECTOMY     TOTAL ABDOMINAL HYSTERECTOMY W/ BILATERAL SALPINGOOPHORECTOMY     TRIGGER FINGER RELEASE Bilateral     Allergies: Allergies as of 08/26/2023 - Review Complete 08/23/2023  Allergen Reaction Noted   Ezetimibe  Diarrhea and Nausea Only 07/19/2017   Hydrocodone -acetaminophen  Other (See Comments) 09/25/2021   Metoclopramide  Diarrhea 07/30/2017   Propoxyphene Other (See  Comments) 11/24/2018   Cefuroxime  09/25/2021   Hydrocodone  Other (See Comments) 07/26/2020   Tramadol  Hives 08/08/2018   Ceftin [cefuroxime axetil] Diarrhea 11/04/2015   Codeine Rash 05/05/2010   Penicillins Rash and Other (See Comments) 05/05/2010   Statins Other (See Comments) 06/05/2013   Vicodin [hydrocodone -acetaminophen ] Other (See Comments) 10/23/2013    Medications: Outpatient Encounter Medications as of 08/26/2023  Medication Sig   Acetaminophen  (TYLENOL  ARTHRITIS PAIN PO) Take 1,300 mg by mouth every 8 (eight) hours as needed (pain).   albuterol  (PROVENTIL ) (2.5 MG/3ML) 0.083% nebulizer solution Take 3 mLs (2.5 mg total) by nebulization every 4 (four) hours as needed for wheezing or shortness of breath.   albuterol  (VENTOLIN  HFA) 108 (90 Base) MCG/ACT inhaler Inhale 2 puffs into the lungs every 6 (six) hours as needed.   aspirin  81 MG tablet Take 1 tablet (81 mg total) by mouth 2 (two) times daily.   atenolol  (TENORMIN ) 25 MG tablet Take 12.5 mg by mouth 2 (two) times daily.   cholecalciferol  (VITAMIN D3) 25 MCG (1000 UNIT) tablet Take 2,000 Units by mouth daily.   Crisaborole  (EUCRISA ) 2 % OINT Apply 1 Application topically as directed. qd to bid to aa  eczema on back as needed for flares   desvenlafaxine (PRISTIQ) 25 MG 24 hr tablet Take 25 mg by mouth daily.   diclofenac Sodium (VOLTAREN) 1 % GEL Apply 2 g topically 2 (two) times daily as needed (pain).   doxycycline  (PERIOSTAT ) 20 MG tablet TAKE 1 TABLET BY MOUTH TWICE A DAY   Dupilumab  (DUPIXENT ) 300 MG/2ML SOAJ Inject 300 mg into the skin every 14 (fourteen) days. Starting at day 15 for maintenance.   empagliflozin  (JARDIANCE ) 10 MG TABS tablet Take 10 mg by mouth daily.   esomeprazole (NEXIUM) 40 MG capsule Take 40 mg by mouth 2 (two) times daily.   etodolac  (LODINE ) 400 MG tablet Take 400 mg by mouth 2 (two) times daily.   gabapentin  (NEURONTIN ) 300 MG capsule Take 300 mg by mouth 3 (three) times daily.   Insulin  Aspart (NOVOLOG  FLEXPEN [place]) Inject 10 Units into the skin daily as needed (when eating a high sugar meal).   LANTUS  SOLOSTAR 100 UNIT/ML Solostar Pen Inject 16 Units into the skin every morning.   levothyroxine  (SYNTHROID , LEVOTHROID) 75 MCG tablet Take 75 mcg by mouth daily before breakfast.    metolazone  (ZAROXOLYN ) 2.5 MG tablet Take 2.5 mg by mouth daily as needed (fluid overload). Weight over 215 lb   mometasone  (ELOCON ) 0.1 % cream Apply 1 application topically daily as needed (Rash). Qd to bid up to 5 days a week aa right lower leg prn itching   nitroGLYCERIN  (NITROSTAT ) 0.4 MG SL tablet Place 1 tablet (0.4 mg total) under the tongue every 5 (five) minutes as needed for chest pain.   NON FORMULARY Apply 1 application  topically 2 (two) times daily as needed. Azelaic acid /metronidazole /ivermectin  15%/1%/1% Apply to face 1-2 daily as needed for rosacea   ondansetron  (ZOFRAN ) 4 MG tablet Take 4 mg by mouth every 8 (eight) hours as needed for nausea or vomiting.    potassium chloride  (KLOR-CON ) 10 MEQ tablet Take 3 tablets (30 mEq total) by mouth 2 (two) times daily.   PRALUENT  150 MG/ML SOAJ INJECT 150 MG UNDER THE SKIN EVERY 14 DAYS   ranolazine  (RANEXA ) 500 MG 12 hr tablet  Take 1 tablet (500 mg total) by mouth 2 (two) times daily.   ropinirole  (REQUIP ) 5 MG tablet Take 5 mg by mouth every  evening.   Semaglutide,0.25 or 0.5MG /DOS, (OZEMPIC, 0.25 OR 0.5 MG/DOSE,) 2 MG/1.5ML SOPN Inject 0.5 mg into the skin once a week.   temazepam  (RESTORIL ) 30 MG capsule Take 30 mg by mouth at bedtime.   torsemide  (DEMADEX ) 20 MG tablet Take 2 tablets (40 mg total) by mouth daily. May take an additional 1-2 tablets (20 MG-40 MG) as needed for weight gain or swelling.   trolamine salicylate (ASPERCREME) 10 % cream Apply 1 application topically at bedtime.   Facility-Administered Encounter Medications as of 08/26/2023  Medication   dexamethasone  (DECADRON ) injection 10 mg    Social History: Social History   Tobacco Use   Smoking status: Never   Smokeless tobacco: Never  Vaping Use   Vaping status: Never Used  Substance Use Topics   Alcohol use: No   Drug use: No    Family Medical History: Family History  Problem Relation Age of Onset   Heart disease Mother    Breast cancer Mother 81   Heart disease Father    Heart attack Sister    Heart disease Sister    Heart disease Brother     Physical Examination: Vitals:   08/26/23 1030  BP: 128/72      Awake, alert, oriented to person, place, and time.  Speech is clear and fluent. Fund of knowledge is appropriate.   Cranial Nerves: Pupils equal round and reactive to light.  Facial tone is symmetric.    Diffuse lower posterior lumbar tenderness. Well healed lumbar incisions.   No abnormal lesions on exposed skin.   Strength: Side Iliopsoas Quads Hamstring PF DF EHL  R 4 5 5 5 5 5   L 5 5 5 5 5 5    She has pain with strength testing but no other weakness noted.   Reflexes are 2+ and symmetric at the biceps, brachioradialis, patella and achilles.   Hoffman's is absent.   Clonus is not present.    Bilateral  lower extremity sensation is intact to light touch.     Gait not tested. She is in a WC.   Medical  Decision Making  Imaging: Lumbar xrays dated 07/16/23:  FINDINGS: Status post surgical posterior fusion of L2-3 and L3-4 with bilateral intrapedicular screw placement. Interbody fusion of L2-3, L3-4, L4-5 and L5-S1 is noted. No fracture or spondylolisthesis is noted. No change in vertebral body alignment is noted on flexion or extension views.   IMPRESSION: Postsurgical changes as noted above.  No acute abnormality seen.   Aortic Atherosclerosis (ICD10-I70.0).     Electronically Signed   By: Lynwood Landy Raddle M.D.   On: 07/23/2023 08:56  I have personally reviewed the images and agree with the above interpretation.  Assessment and Plan: [name] a history of revision PSF/XLIF L2-L3 with Dr. Clois on 08/10/19. She also had been seeing Dr. Claudene for right sciatic neuropathy s/p right TKA that resolved.   She's had chronic intermittent back and bilateral leg pain (entire leg) to her feet that is worse with standing/walking and better with sitting since her surgery.   Now with a 6 week history of constant right sided LBP that radiates to mid back and down lateral right leg to her foot and right groin. Pain is severe and limits her mobility.   She is fused from L2-S1. She has DDD L1-L2 with no apparent instability. She has 4/5 strength in right iliopsoas with right lateral leg pain and right groin pain.   She had about a month relief with  last ESI. Is scheduled for repeat ESI on 09/17/23 with Dr. Avanell.   Treatment options discussed with patient and following plan made:   - Unable to have MRI due to pacemaker.  - CT myelogram of lumbar spine ordered to further evaluate for adjacent level disease at L1-L2.  - Limited prescription of oxycodone  to use for severe pain. She is miserable. Allergies noted to other pain medications, but has tolerated this previously. Reviewed dosing and side effects. PMP reviewed and is appropriate.  - Will keep scheduled injection with Dr. Avanell for  now.  - Will review CT myelogram results with Dr. Clois and decide on follow up.   I spent a total of 30 minutes in face-to-face and non-face-to-face activities related to this patient's care today including review of outside records, review of imaging, review of symptoms, physical exam, discussion of differential diagnosis, discussion of treatment options, and documentation.   Glade Boys PA-C Dept. of Neurosurgery

## 2023-08-23 NOTE — Progress Notes (Signed)
 Cardiology Office Note   Date:  08/23/2023  ID:  [name], DOB 1945-03-20, MRN [id number] PCP: Ana Oneil FALCON, MD  [facility] Providers Cardiologist:  Ana Lunger, MD Electrophysiologist:  Ana Sage, MD     History of Present Illness [name] is a 78 y.o. female with a past medical history of coronary artery disease medically managed, HFpEF, PAD/symptomatic bradycardia with syncope status post PPM in 02/2019, type 2 diabetes, lymphedema with compression mellitus, hyperlipidemia intolerant to statins, CKD stage III, hypothyroidism, OSA on CPAP, chronic dyspnea, and depression, who presents today for follow-up.   Previously done, remote catheterization 07/2011 which showed left dominant system with moderate mid LAD and proximal D1, proximal RCA disease estimated 50% stenosis with normal LV systolic function.  She was evaluated for chest pain in 2018 with and underwent MPI which was considered low risk scan.  Coronary CTA was equivocal and concerning for LAD disease.  Subsequent left heart cath showed 80% ostial diagonal stenosis to 50% proximal to mid LAD stenosis.  Medical management was recommended.  In 2021 she underwent permanent pacemaker plantation) syncope with loop monitor showing prolonged bradycardia with pauses of up to 13 seconds.  In 08/2020 she was admitted with chest pain and ruled out.  CT of the chest was negative for PE.  Echocardiogram showed EF 60 to 65%, no RWMA, G2 DD, and no significant valvular abnormalities.  Lexiscan  MPI at that time showed no significant ischemia with an EF of 67%.  Overall was low risk study.  08/04/2021 she was seen in clinic noting a 2-week history of constant substernal chest pain that was exacerbated with ambulation associated with diaphoresis, dyspnea, nausea and flushing.  EKG showed sinus rhythm with nonspecific anterior ST/T wave changes.  She continued to have ongoing chest pain episode to the emergency department for further  evaluation.  High-sensitivity troponin of 3 with delta troponin of less than 2.  She declined admission.  Imdur  was titrated to 30 mg daily.  Subsequent coronary CTA in 08/10/2021 showed calcium  score of 897 which was 92nd percentile for age and sex matched control.  Moderate calcified plaque in the proximal LAD and D1, estimated at 50 to 69% stenosis as well as mild proximal RCA and mid left circumflex calcified stenosis with CT FFR being negative.  She was evaluated by Dr. Gollan in clinic 09/25/2021 with complaints of lower extremity edema, tachycardia, chest pain, shortness of breath.  She was restarted on low-dose Imdur  15 mg daily and atenolol  12.5 mg daily.  She was again evaluated in the [facility] emergency department 12/24/2021 for shortness of breath.  She reported over the last month that she did shortness of breath seem to be worsening.  She continued to have chest pain.  Echocardiogram revealed an LVEF of 55 to 60%, no RWMA, mild LVH, G2 DD, mild mitral valve regurgitation.  Right and left heart catheterization completed on 12/26/2021 revealed stable coronary arteries with severe stenosis involving the proximal portion of the small D1 branch and otherwise mild to moderate nonobstructive disease, mildly elevated left filling pressures, moderately to severely elevated right heart filling pressures.  She was continued with medical therapy as the T1 was not well-suited for PCI.  She was diuresed and had SGLT2 inhibitor added to her medication regimen.   She was last seen in clinic 12/07/2022.  At that time she had shortness of breath and angina with exertion.  She previously been admitted to [facility] in September with COVID.  She  was noted to have a large burden of PVCs at the time.  She was considered to be at acceptable risk for general knee replacement without further cardiac testing needed.  There were no medication changes that were made.  She returns to clinic today with varying symptoms.  She stated  that she has been having atypical chest pain or jaw pain.  She has to take nitroglycerin  to get relief.  Her pain is resolved after she takes nitro.  She states over the last several months she has taken nitro approximately 4 times she still continues to have numbness in the bottom of her feet status post replacement.  She has been continued on Ozempic and is down to 187 pounds at home.  She has often also suffering from weakness, dizziness, and altered balance.  She stated that with her weight loss her torsemide  was decreased from 3 pills daily to 2 pills.  She is concerned today about her continued discomfort and the need for Nitrostat .  She states that she has been compliant with her current medication regimen without any undue side effects denies any recent hospitalizations or visits to the emergency department.  ROS: 10 point review of systems has been reviewed and considered negative except ones been listed in the HPI  Studies Reviewed EKG Interpretation Date/Time:  Friday August 23 2023 11:24:52 EDT Ventricular Rate:  85 PR Interval:  146 QRS Duration:  80 QT Interval:  366 QTC Calculation: 435 R Axis:   14  Text Interpretation: Normal sinus rhythm Low voltage QRS When compared with ECG of 03-Jan-2023 14:46, No significant change since last tracing Confirmed by Gerard Frederick ([id number]) on 08/23/2023 11:30:33 AM    R/LHC 12/26/2021 Conclusions: Stable appearance of coronary arteries with severe stenosis involving proximal portion of small D1 branch and otherwise mild-moderate, non-obstructive disease. Mildly elevated left heart filling pressures. Moderately-severely elevated right heart filling pressures. Normal Fick cardiac output/index.   Recommendations: Continue medical therapy of CAD.  D1 is not well-suited for PCI. Consider gentle diuresis and addition of SGLT-2 inhibitor if renal function remains stable/improved.  2D echo 12/26/2021  1. Left ventricular ejection fraction, by  estimation, is 55 to 60%. The  left ventricle has normal function. The left ventricle has no regional  wall motion abnormalities. There is mild left ventricular hypertrophy.  Left ventricular diastolic parameters  are consistent with Grade II diastolic dysfunction (pseudonormalization).  Elevated left atrial pressure.   2. Right ventricular systolic function is normal. The right ventricular  size is normal.   3. The mitral valve is degenerative. Mild mitral valve regurgitation. No  evidence of mitral stenosis.   4. The aortic valve is tricuspid. There is mild calcification of the  aortic valve. There is mild thickening of the aortic valve. Aortic valve  regurgitation is not visualized. Aortic valve sclerosis/calcification is  present, without any evidence of  aortic stenosis.   Risk Assessment/Calculations         Physical Exam VS:  BP (!) 124/58 (BP Location: Left Arm, Patient Position: Sitting, Cuff Size: Normal)   Pulse 85   Ht 5' 1 (1.549 m)   Wt 189 lb 12.8 oz (86.1 kg)   SpO2 99%   BMI 35.86 kg/m        Wt Readings from Last 3 Encounters:  08/23/23 189 lb 12.8 oz (86.1 kg)  07/15/23 191 lb (86.6 kg)  05/29/23 191 lb (86.6 kg)    GEN: Well nourished, well developed in no acute distress NECK: No  JVD; No carotid bruits CARDIAC: RRR, no murmurs, rubs, gallops RESPIRATORY:  Clear to auscultation without rales, wheezing or rhonchi  ABDOMEN: Soft, non-tender, non-distended EXTREMITIES: Trace pretibial edema; No deformity   ASSESSMENT AND PLAN Atypical chest pain and jaw pain that has required the use of nitroglycerin .  EKG today reveals sinus rhythm with rate 85 with no significant change.  With the continued pain and last heart catheterization in 2023 showed stable appearance of coronaries with severe stenosis as well the proximal portion is mildly impressionable has mild to moderate nonobstructive disease.  The D1 was not well-suited for PCI.  She has been scheduled for a  cardiac PET stress to evaluate ongoing atypical chest discomfort and jaw pain relieved with nitro.  Coronary artery disease: Native coronary arteries.  Unfortunately she has been having atypical symptoms that required nitro.  There were no ischemic changes on her EKG.  Last heart catheterization was in 12/2021 as mentioned above.  She has been continued on aspirin  81 mg daily, Praluent  injections every 2 weeks, Ranexa  1000 mg twice daily.  She also continues to remain on sublingual nitro.   Chronic diastolic congestive heart failure with an LVEF 55 to 60%, no RWMA, mild LVH, G2 DD, mild MR.  Shortness of breath is slightly improved with weight loss from being on Ozempic.  She appears to be euvolemic on exam today.  We are decreasing her torsemide  to 20 mg daily and her potassium to 10 mEq daily.  She is continued on Jardiance  10 mg daily daily seen for weight gain.  Kidney function has remained stable with the last serum creatinine of 1.5.  Hypertension with a blood pressure today 120/43.  Blood pressures remain stable.  She is continued on atenolol  12.5 mg twice daily torsemide  and 20 mg daily.  She has been encouraged to continue to monitor pressures 1 to 2 hours postmedication administration at home as well.  Hyperlipidemia with statin intolerance where she is continued on Praluent  injections 150 mg every 2 weeks.  LDL 42 weeks ago.  Remains at goal of less than 55.  Atrial tachycardia/symptomatic bradycardia/syncopal episodes with permanent pacemaker in place.  She continues to be followed by EP.  Last interrogation showed battery was stable with no changes noted on histograms.  Encouraged to continue to follow-up with EP.  Type 2 diabetes where she is continued on Jardiance  and insulin .  Ongoing management per PCP.  Obstructive sleep apnea stating that she continues to remain compliant with her CPAP.   Informed Consent   Shared Decision Making/Informed Consent The risks [chest pain, shortness  of breath, cardiac arrhythmias, dizziness, blood pressure fluctuations, myocardial infarction, stroke/transient ischemic attack, nausea, vomiting, allergic reaction, radiation exposure, metallic taste sensation and life-threatening complications (estimated to be 1 in 10,000)], benefits (risk stratification, diagnosing coronary artery disease, treatment guidance) and alternatives of a cardiac PET stress test were discussed in detail with Ms. Perri and she agrees to proceed.     Dispo: Patient to return to clinic to see MD/APP in 6 weeks or sooner after testing is completed or sooner if needed.   Signed, Dewitte Vannice, NP

## 2023-08-23 NOTE — Patient Instructions (Signed)
 Medication Instructions:  Your physician recommends the following medication changes.  DECREASE: Torsemide  to 20 mg daily Potassium to 10 mg daily   *If you need a refill on your cardiac medications before your next appointment, please call your pharmacy*  Lab Work: No labs ordered today  If you have labs (blood work) drawn today and your tests are completely normal, you will receive your results only by: MyChart Message (if you have MyChart) OR A paper copy in the mail If you have any lab test that is abnormal or we need to change your treatment, we will call you to review the results.  Testing/Procedures:  Please report to Radiology at [facility] Main Entrance, medical mall, 30 mins prior to your test.  [address]  [place], [place]  How to Prepare for Your Cardiac PET/CT Stress Test:  Nothing to eat or drink, except water, 3 hours prior to arrival time.  NO caffeine/decaffeinated products, or chocolate 12 hours prior to arrival. (Please note decaffeinated beverages (teas/coffees) still contain caffeine).  If you have caffeine within 12 hours prior, the test will need to be rescheduled.  Medication instructions: Do not take erectile dysfunction medications for 72 hours prior to test (sildenafil, tadalafil) Do not take nitrates (isosorbide  mononitrate, Ranexa ) the day before or day of test Do not take tamsulosin the day before or morning of test Hold theophylline containing medications for 12 hours. Hold Dipyridamole 48 hours prior to the test.  Diabetic Preparation: If able to eat breakfast prior to 3 hour fasting, you may take all medications, including your insulin . Do not worry if you miss your breakfast dose of insulin  - start at your next meal. If you do not eat prior to 3 hour fast-Hold all diabetes (oral and insulin ) medications. Patients who wear a continuous glucose monitor MUST remove the device prior to scanning.  You may take your  remaining medications with water.  NO perfume, cologne or lotion on chest or abdomen area. FEMALES - Please avoid wearing dresses to this appointment.  Total time is 1 to 2 hours; you may want to bring reading material for the waiting time.  IF YOU THINK YOU MAY BE PREGNANT, OR ARE NURSING PLEASE INFORM THE TECHNOLOGIST.  In preparation for your appointment, medication and supplies will be purchased.  Appointment availability is limited, so if you need to cancel or reschedule, please call the Radiology Department Scheduler at [phone] 24 hours in advance to avoid a cancellation fee of $100.00  What to Expect When you Arrive:  Once you arrive and check in for your appointment, you will be taken to a preparation room within the Radiology Department.  A technologist or Nurse will obtain your medical history, verify that you are correctly prepped for the exam, and explain the procedure.  Afterwards, an IV will be started in your arm and electrodes will be placed on your skin for EKG monitoring during the stress portion of the exam. Then you will be escorted to the PET/CT scanner.  There, staff will get you positioned on the scanner and obtain a blood pressure and EKG.  During the exam, you will continue to be connected to the EKG and blood pressure machines.  A small, safe amount of a radioactive tracer will be injected in your IV to obtain a series of pictures of your heart along with an injection of a stress agent.    After your Exam:  It is recommended that you eat a meal and  drink a caffeinated beverage to counter act any effects of the stress agent.  Drink plenty of fluids for the remainder of the day and urinate frequently for the first couple of hours after the exam.  Your doctor will inform you of your test results within 7-10 business days.  For more information and frequently asked questions, please visit our website: [email or web address]  For questions about your test  or how to prepare for your test, please call: Cardiac Imaging Nurse Navigators Office: [phone]   Follow-Up: At [facility], you and your health needs are our priority.  As part of our continuing mission to provide you with exceptional heart care, our providers are all part of one team.  This team includes your primary Cardiologist (physician) and Advanced Practice Providers or APPs (Physician Assistants and Nurse Practitioners) who all work together to provide you with the care you need, when you need it.  Your next appointment:   6 week(s)  Provider:   Evalene Lunger, MD or Tylene Lunch, NP

## 2023-08-26 ENCOUNTER — Encounter: Payer: Self-pay | Admitting: Orthopedic Surgery

## 2023-08-26 ENCOUNTER — Ambulatory Visit (INDEPENDENT_AMBULATORY_CARE_PROVIDER_SITE_OTHER): Admitting: Orthopedic Surgery

## 2023-08-26 VITALS — BP 128/72 | Ht 61.5 in | Wt 189.0 lb

## 2023-08-26 DIAGNOSIS — Z7409 Other reduced mobility: Secondary | ICD-10-CM

## 2023-08-26 DIAGNOSIS — M47816 Spondylosis without myelopathy or radiculopathy, lumbar region: Secondary | ICD-10-CM

## 2023-08-26 DIAGNOSIS — Z981 Arthrodesis status: Secondary | ICD-10-CM | POA: Diagnosis not present

## 2023-08-26 DIAGNOSIS — M545 Low back pain, unspecified: Secondary | ICD-10-CM | POA: Diagnosis not present

## 2023-08-26 DIAGNOSIS — M51362 Other intervertebral disc degeneration, lumbar region with discogenic back pain and lower extremity pain: Secondary | ICD-10-CM

## 2023-08-26 DIAGNOSIS — M5416 Radiculopathy, lumbar region: Secondary | ICD-10-CM

## 2023-08-26 MED ORDER — OXYCODONE HCL 5 MG PO TABS: 5.0000 mg | ORAL_TABLET | Freq: Three times a day (TID) | ORAL | 0 refills | Status: AC | PRN

## 2023-08-26 NOTE — Patient Instructions (Signed)
 It was so nice to see you today. Thank you so much for coming in.    You have some wear and tear at L1-L2, above your fusion L2-S1. This  may be causing your pain.  I want to get an CT myelogram of your lower back to look into things further. We will get this approved through your insurance and they will call you to schedule the appointment. Ask about your patient responsibility. You do not need to pay this prior to getting MRI, they can bill you.   [facility]el[facility] at either [facility] or [facility].   After you have the myelogram, it can take 14-28 days for me to get the results back. If I don't have them in 2 weeks, we will call to try to get the results.   Once I have the results, I will review with Dr. Clois and we will contact you regarding a follow up.   I sent a prescription for oxycodone  to help with severe pain. Take only as needed and take the least amount possible. This can make you sleepy and/or constipated.   Keep your appointment with Dr. Avanell for now.   Please do not hesitate to call if you have any questions or concerns. You can also message me in MyChart.   Glade Boys PA-C [phone]     The physicians and staff at [facility] are committed to providing excellent care. You may receive a survey asking for feedback about your experience at our office. We value you your feedback and appreciate you taking the time to to fill it out. The [facility] leadership team is also available to discuss your experience in person, feel free to contact us  [phone].

## 2023-08-28 ENCOUNTER — Ambulatory Visit: Payer: Medicare Other

## 2023-08-28 DIAGNOSIS — R55 Syncope and collapse: Secondary | ICD-10-CM | POA: Diagnosis not present

## 2023-08-28 NOTE — Progress Notes (Unsigned)
 Electrophysiology Clinic Note    Date:  08/29/2023  Patient ID:  [name], DOB [birth year], MRN [id number] PCP:  Cleotilde Oneil FALCON, MD  Cardiologist:  Evalene Lunger, MD  Cardiology APP:  Gerard Frederick, NP  Electrophysiologist:  Elspeth Sage, MD  Electrophysiology APP:  Kelty Szafran, NP    Discussed the use of AI scribe software for clinical note transcription with the patient, who gave verbal consent to proceed.   Patient Profile    Chief Complaint: yearly PPM follow-up  History of Present Illness: [name] is a 78 y.o. female with PMH notable for atach, syncope, SND s/p PPM, CAD, HFpEF, OSA on CPAP, HTN, T2DM, lymphedema, CKD-3, hypothyroid ; seen today for Elspeth Sage, MD for routine electrophysiology followup.   I last saw her 05/2022 for routine EP follow-up. She had recently been to ER for syncopal event and found to be hypokalemic. She was doing well at the appointment.   She recently saw NP Hammock where patient was having atypical chest pain in her jaw relieved with nitroglycerin . A pet CT is scheduled to further eval in early August.   On follow-up today, she has not had any further jaw pain since she saw NP Hammock. She is scheduled for PET CT in about 2 weeks. She continues with her intricate diuretic regimen. She took 40mg  torsemide  this morning and has some edema currently. She will likely take extra torsemide  tomorrow.    Arrhythmia/Device History Biotronik dual chamber PPM, imp 02/2019; dx syncope - MDT RA and RV leads     ROS:  Please see the history of present illness. All other systems are reviewed and otherwise negative.    Physical Exam    VS:  BP 126/72 (BP Location: Right Arm, Patient Position: Sitting, Cuff Size: Normal)   Pulse 72   Ht 5' 4 (1.626 m)   Wt 187 lb (84.8 kg)   SpO2 96%   BMI 32.10 kg/m  BMI: Body mass index is 32.1 kg/m.      Wt Readings from Last 3 Encounters:  08/29/23 187 lb (84.8 kg)  08/26/23 189 lb  (85.7 kg)  08/23/23 189 lb 12.8 oz (86.1 kg)     GEN- The patient is well appearing, alert and oriented x 3 today.   Lungs- Clear to ausculation bilaterally, normal work of breathing.  Heart- Regular rate and rhythm, no murmurs, rubs or gallops Extremities- Trace peripheral edema, warm, dry Skin-  device pocket well-healed, no tethering   Device interrogation done today and reviewed by myself:  Battery 6 + years Lead thresholds, impedence, sensing stable  No recent episodes Low VP No changes made today   Studies Reviewed   Previous EP, cardiology notes.    EKG is not ordered. Personal review of EKG from 08/23/2023 shows:  SR at 85bpm        [facility], 12/2021 Stable appearance of coronary arteries with severe stenosis involving proximal portion of small D1 branch and otherwise mild-moderate, non-obstructive disease. Mildly elevated left heart filling pressures. Moderately-severely elevated right heart filling pressures. Normal Fick cardiac output/index.   TTE, 12/26/2021  1. Left ventricular ejection fraction, by estimation, is 55 to 60%. The left ventricle has normal function. The left ventricle has no regional wall motion abnormalities. There is mild left ventricular hypertrophy. Left ventricular diastolic parameters are consistent with Grade II diastolic dysfunction (pseudonormalization). Elevated left atrial pressure.   2. Right ventricular systolic function is normal. The right ventricular size is normal.  3. The mitral valve is degenerative. Mild mitral valve regurgitation. No  evidence of mitral stenosis.   4. The aortic valve is tricuspid. There is mild calcification of the aortic valve. There is mild thickening of the aortic valve. Aortic valve regurgitation is not visualized. Aortic valve sclerosis/calcification is present, without any evidence of aortic stenosis.      Assessment and Plan     #) SND s/p Biotronk dual chamber PPM Device functioning well, see  paceart for details Low VP   #) HFpEF #) hypokalemia  Appears stable from a fluid standpoint Continue diuretic regimen as per gen cards     #) CAD Having intermittent jaw pain with activity, planned for PET CT soon as per gen cards       Current medicines are reviewed at length with the patient today.   The patient does not have concerns regarding her medicines.  The following changes were made today:  none  Labs/ tests ordered today include:  No orders of the defined types were placed in this encounter.    Disposition: Follow up with Dr. Kennyth or EP APP in 12 months, prefer MD to establish care with new MD   Signed, Chantal Needle, NP  08/29/23  5:22 PM  Electrophysiology [facility]

## 2023-08-29 ENCOUNTER — Encounter: Payer: Self-pay | Admitting: Cardiology

## 2023-08-29 ENCOUNTER — Ambulatory Visit: Attending: Cardiology | Admitting: Cardiology

## 2023-08-29 VITALS — BP 126/72 | HR 72 | Ht 64.0 in | Wt 187.0 lb

## 2023-08-29 DIAGNOSIS — I5032 Chronic diastolic (congestive) heart failure: Secondary | ICD-10-CM | POA: Insufficient documentation

## 2023-08-29 DIAGNOSIS — I455 Other specified heart block: Secondary | ICD-10-CM | POA: Diagnosis not present

## 2023-08-29 DIAGNOSIS — Z950 Presence of cardiac pacemaker: Secondary | ICD-10-CM | POA: Diagnosis present

## 2023-08-29 LAB — CUP PACEART INCLINIC DEVICE CHECK
Date Time Interrogation Session: 20250717172625
Implantable Lead Connection Status: 753985
Implantable Lead Connection Status: 753985
Implantable Lead Implant Date: 20210120
Implantable Lead Implant Date: 20210120
Implantable Lead Location: 753859
Implantable Lead Location: 753860
Implantable Lead Model: 5076
Implantable Lead Model: 5076
Implantable Pulse Generator Implant Date: 20210120
Pulse Gen Model: 407145
Pulse Gen Serial Number: 69765687

## 2023-08-29 LAB — CUP PACEART REMOTE DEVICE CHECK
Battery Voltage: 70
Date Time Interrogation Session: 20250716110348
Implantable Lead Connection Status: 753985
Implantable Lead Connection Status: 753985
Implantable Lead Implant Date: 20210120
Implantable Lead Implant Date: 20210120
Implantable Lead Location: 753859
Implantable Lead Location: 753860
Implantable Lead Model: 5076
Implantable Lead Model: 5076
Implantable Pulse Generator Implant Date: 20210120
Pulse Gen Model: 407145
Pulse Gen Serial Number: 69765687

## 2023-08-29 NOTE — Patient Instructions (Addendum)
 Medication Instructions:   Your physician recommends that you continue on your current medications as directed. Please refer to the Current Medication list given to you today.   *If you need a refill on your cardiac medications before your next appointment, please call your pharmacy*  Lab Work: No labs ordered today  If you have labs (blood work) drawn today and your tests are completely normal, you will receive your results only by: MyChart Message (if you have MyChart) OR A paper copy in the mail If you have any lab test that is abnormal or we need to change your treatment, we will call you to review the results.  Testing/Procedures: No test ordered today   Follow-Up: At [facility], you and your health needs are our priority.  As part of our continuing mission to provide you with exceptional heart care, our providers are all part of one team.  This team includes your primary Cardiologist (physician) and Advanced Practice Providers or APPs (Physician Assistants and Nurse Practitioners) who all work together to provide you with the care you need, when you need it.  Your next appointment:   12 month(s)  Provider:   You may see or Suzann Riddle, NP

## 2023-09-03 ENCOUNTER — Ambulatory Visit: Admitting: Dermatology

## 2023-09-03 NOTE — Discharge Instructions (Signed)

## 2023-09-04 ENCOUNTER — Ambulatory Visit
Admission: RE | Admit: 2023-09-04 | Discharge: 2023-09-04 | Disposition: A | Discharge status: Home / Self Care | Source: Ambulatory Visit | Attending: Orthopedic Surgery | Admitting: Orthopedic Surgery

## 2023-09-04 ENCOUNTER — Telehealth: Payer: Self-pay

## 2023-09-04 DIAGNOSIS — Z981 Arthrodesis status: Secondary | ICD-10-CM

## 2023-09-04 DIAGNOSIS — M5416 Radiculopathy, lumbar region: Secondary | ICD-10-CM

## 2023-09-04 DIAGNOSIS — M47816 Spondylosis without myelopathy or radiculopathy, lumbar region: Secondary | ICD-10-CM

## 2023-09-04 MED ORDER — ONDANSETRON HCL 4 MG/2ML IJ SOLN: 4.0000 mg | Freq: Once | INTRAMUSCULAR | Status: DC | PRN

## 2023-09-04 MED ORDER — IOPAMIDOL (ISOVUE-M 200) INJECTION 41%
15.0000 mL | Freq: Once | INTRAMUSCULAR | Status: AC
  Administered 2023-09-04: 15 mL via INTRATHECAL

## 2023-09-04 MED ORDER — DIAZEPAM 5 MG PO TABS
5.0000 mg | ORAL_TABLET | Freq: Once | ORAL | Status: AC
  Administered 2023-09-04: 5 mg via ORAL

## 2023-09-04 MED ORDER — MEPERIDINE HCL 50 MG/ML IJ SOLN: 50.0000 mg | Freq: Once | INTRAMUSCULAR | Status: DC | PRN

## 2023-09-09 ENCOUNTER — Other Ambulatory Visit: Payer: Self-pay

## 2023-09-09 ENCOUNTER — Telehealth: Payer: Self-pay

## 2023-09-09 DIAGNOSIS — L2089 Other atopic dermatitis: Secondary | ICD-10-CM

## 2023-09-09 MED ORDER — DUPIXENT 300 MG/2ML ~~LOC~~ SOAJ: 300.0000 mg | SUBCUTANEOUS | 6 refills | Status: AC

## 2023-09-09 NOTE — Progress Notes (Signed)
 Insurance requires that patient use [facility] pharmacy.

## 2023-09-09 NOTE — Telephone Encounter (Signed)
 Discussed with patient that insurance is requiring an authorization of representation form to be signed to move forward with [employer]  denial appeal. Advised patient that it will be waiting for her at the front desk to sign.

## 2023-09-13 ENCOUNTER — Encounter: Payer: Self-pay | Admitting: Orthopedic Surgery

## 2023-09-13 ENCOUNTER — Ambulatory Visit: Admitting: Orthopedic Surgery

## 2023-09-13 DIAGNOSIS — M5126 Other intervertebral disc displacement, lumbar region: Secondary | ICD-10-CM

## 2023-09-13 DIAGNOSIS — M79604 Pain in right leg: Secondary | ICD-10-CM

## 2023-09-13 DIAGNOSIS — M48062 Spinal stenosis, lumbar region with neurogenic claudication: Secondary | ICD-10-CM | POA: Diagnosis not present

## 2023-09-13 DIAGNOSIS — M47816 Spondylosis without myelopathy or radiculopathy, lumbar region: Secondary | ICD-10-CM

## 2023-09-13 DIAGNOSIS — M51369 Other intervertebral disc degeneration, lumbar region without mention of lumbar back pain or lower extremity pain: Secondary | ICD-10-CM

## 2023-09-13 DIAGNOSIS — Z981 Arthrodesis status: Secondary | ICD-10-CM | POA: Diagnosis not present

## 2023-09-13 DIAGNOSIS — M5416 Radiculopathy, lumbar region: Secondary | ICD-10-CM

## 2023-09-13 NOTE — Progress Notes (Addendum)
 Telephone Visit- Progress Note: Referring Physician:  Cleotilde Oneil FALCON, MD [name] [facility] [place],  [place] [address]  Primary Physician:  Cleotilde Oneil FALCON, MD  This visit was performed via telephone.  Patient location: home Provider location: working from home  I spent a total of 15 minutes non-face-to-face activities for this visit on the date of this encounter including review of current clinical condition and response to treatment.    Patient has given verbal consent to this telephone visits and we reviewed the limitations of a telephone visit. Patient wishes to proceed.    Chief Complaint:  review lumbar imaging  History of Present Illness: [name] has a history of osteoporosis, CHF, CKD stage 3, CAD, DM, GERD, HTN, obesity. TLS, OSA with CPAP.    She has a pacemaker.    She has history of revision PSF/XLIF L2-L3 with Dr. Clois on 08/10/19. She's had chronic intermittent back and bilateral leg pain (entire leg) to her feet that is worse with standing/walking and better with sitting since her surgery.    She was seen by Dr. Claudene on 07/15/23 for right sciatic neuropathy s/p right TKA. No compression seen on EMG. He did not recommend further surgery.   Last seen by me on 08/26/23 with constant right sided LBP that radiates to mid back and down lateral right leg to her foot and right groin.   She was given oxycodone  at her last visit for severe pain. Phone visit scheduled to review her lumbar CT myelogram.   Her pain has improved since her last visit, it is no longer severe. She still needs to use a walker/cane with ambulating due to pain.   She continues with constant right sided LBP with radiation to her groin and lateral leg pain to her foot. Her primary pain is in right buttocks and groin. She is worse with standing and walking. She has intermittent episodes of severe pain in her legs- she has to sit and it improves after 5  minutes. She does not get this everyday. When pain is severe, she feels weakness in her legs. Some numbness noted in right leg that is chronic.   She had to take a few oxycodone  and this helped with her pain. She is taking lodine  and neurontin .    She had about a month relief with last ESI. Is scheduled for another [facility] on Tuesday 09/17/23.    Last HgbA1c on 06/10/23 was 5.5.    Tobacco use: Does not smoke.    Bowel/Bladder Dysfunction: bladder incontinence and urgency x 1 year at least. No bowel issues. No perineal numbness.    Conservative measures:  Physical therapy: participated in for her knee recently but not for back.  Multimodal medical therapy including regular antiinflammatories:  Tylenol , Voltaren Gel,Gabapentin , Oxycodone  Injections:  06/27/2023: Right L1-2 and right L4-5 transforaminal ESI (60% relief)  03/14/2023: Right L1-2 and right L4-5 transforaminal ESI 12/05/2022: Right L1-2 and right L4-5 transforaminal ESI (60% relief, [place] location)  06/29/2022: Right L1-2 and right L4-5 transforaminal ESI (over 70% relief) 02/20/2022: Right L1-2 and right L4-5 transforaminal ESI (over 70% relief)  11/14/2021: Right L1-2 and right L4-5 transforaminal ESI (initially 70% relief then sustained greater than 50% relief) 07/20/2021: Right L1-2 and right L4-5 transforaminal ESI (70% relief) 04/18/2021: Right L1-2 and right L5-S1 transforaminal ESI (70% relief) 01/17/2021: Right L1-2 transforaminal ESI (over 60% relief) 11/30/2020: Right L1-2 transforaminal ESI (25% relief) 08/01/2020: Right sacrococcygeal injection (good relief of tailbone  pain) 08/10/2019: L2-3 XLIF (Dr. Clois) 04/23/2019: Right L2-3 transforaminal ESI (relief of upper back pain, no relief of L3 radicular symptoms; right L3-4 transforaminal approach was aborted due to tachycardia, lightheadedness, O2 sat of 89%) 03/24/2019: Right sacroiliac joint injection under ultrasound 01/20/2019: Right L2-3 and right L3-4  transforaminal ESI (moderate to good relief x6 weeks) 12/05/2018: Right L2-3 and right L3-4 transforaminal ESI (moderate relief)    Past Surgery:  Right knee replacement 02/26/23 Dr. Mardee DOS 08/10/19 - L2-3 XLIF, PSF (revision) DOS 08/24/19 - I and D and revision of posterior DOS 09/14/19 - debridement by Dr. Delores   The symptoms are causing a significant impact on the patient's life.    Exam: No exam done as this was a telephone encounter.     Imaging: Lumbar CT myelogram dated 09/04/23:  FINDINGS: Lumbar puncture for intrathecal contrast administration and acquisition of conventional myelogram images were performed by Delon Beagle NP under the supervision of Dr. Jennefer and reported separately.   Segmentation:  5 lumbar type vertebral bodies.   Alignment: Unchanged fused 7 mm retrolisthesis of L2 on L3. New trace retrolisthesis of L1 on L2.   Vertebrae: No acute fracture or suspicious lesion. Previous posterior and interbody fusion from L2-S1 with solid interbody arthrodesis at each level. Pedicle screws remain in place bilaterally from L2-L4 without evidence of loosening. An old pedicle screw tracks are again noted at L5 and S1. Solid posterolateral osseous fusion is present on the left from L2-S1 and on the right at L5-S1.   Conus medullaris: Extends to the L2 level and appears normal.   Paraspinal and other soft tissues: Aortic atherosclerosis without aneurysm. Postoperative changes in the posterior lumbar soft tissues. Punctate nonobstructing bilateral renal calculi.   Disc levels:   T11-12: Mild disc space narrowing and vacuum disc phenomenon. Mild disc bulging eccentric to the right without significant stenosis, unchanged.   T12-L1: Moderate disc space narrowing with vacuum disc phenomenon and degenerative endplate sclerosis. Disc bulging eccentric to the left without significant stenosis, unchanged.   L1-2: New mild disc space narrowing with vacuum disc  phenomenon and degenerative endplate changes including a new prominent Schmorl's node involving the L1 inferior endplate on the right. New disc bulging eccentric to the right and moderate facet and ligamentum flavum hypertrophy result in mild right lateral recess stenosis without significant spinal or neural foraminal stenosis.   L2-3: Prior fusion.  No significant stenosis.   L3-4: Prior posterior decompression and fusion. No significant stenosis.   L4-5: Prior posterior decompression and fusion. No significant stenosis.   L5-S1: Prior posterior decompression and fusion with widely patent spinal canal. Unchanged mild osseous neural foraminal narrowing bilaterally.   IMPRESSION: 1. Solid L2-S1 fusion without significant residual stenosis. 2. New adjacent segment disease at L1-2 with mild right lateral recess stenosis. 3.  Aortic Atherosclerosis (ICD10-I70.0).     Electronically Signed   By: Dasie Hamburg M.D.   On: 09/11/2023 16:06  I have personally reviewed the images and agree with the above interpretation.  Above imaging reviewed with Dr. Clois prior to her visit.   Assessment and Plan: Ms. Stapleton a history of revision PSF/XLIF L2-L3 with Dr. Clois on 08/10/19. She also had been seeing Dr. Claudene for right sciatic neuropathy s/p right TKA that resolved.    She's had chronic intermittent back and bilateral leg pain (entire leg) to her feet that is worse with standing/walking and better with sitting since her surgery.   Her pain has improved since her  last visit, it is no longer severe.   She continues with constant right sided LBP with radiation to her groin and lateral leg pain to her foot. Her primary pain is in right buttocks and groin. She is worse with standing and walking. She still needs to use a walker/cane with ambulating due to pain.   She has known vacuum disc T11-L1 along with vacuum disc L1-L2 with disc bulging and mild right lateral recess stenosis.  She is fused from L2-S1.  Treatment options discussed with Dr. Clois prior to her visit. Following plan made with patient:   - Recommend she have ESI as scheduled with Dr. Avanell on Tuesday 09/17/23.  - Continue on prn oxycodone  for severe pain. She takes rarely. Can refill if needed. She will let me know.  - PT for lumbar spine- orders to [facility] PT in [place].  - Follow up with me in 6-8 weeks and prn.  - If no improvement with above and she completes PT, she likely is a candidate to extend her fusion up. She would like to avoid surgery if possible.   Glade Boys PA-C Neurosurgery

## 2023-09-16 ENCOUNTER — Encounter (HOSPITAL_COMMUNITY): Payer: Self-pay

## 2023-09-16 ENCOUNTER — Telehealth: Payer: Self-pay

## 2023-09-16 NOTE — Telephone Encounter (Signed)
 [employer] [place] appeal request denied for Dupixent . Pt has tried and failed TMC 0.1% cream, and Eucrisa  2% ointment, but must also fail pimecrolimus or tacrolimus . Please advise.

## 2023-09-17 MED ORDER — TACROLIMUS 0.1 % EX OINT: TOPICAL_OINTMENT | Freq: Every day | CUTANEOUS | 2 refills | Status: AC

## 2023-09-17 NOTE — Addendum Note (Signed)
 Addended by: WILLMA KNEE A on: 09/17/2023 01:05 PM   Modules accepted: Orders

## 2023-09-17 NOTE — Telephone Encounter (Signed)
Patient informed of information and prescription sent to pharmacy.

## 2023-09-18 ENCOUNTER — Ambulatory Visit: Payer: Self-pay | Admitting: Cardiology

## 2023-09-19 ENCOUNTER — Ambulatory Visit
Admission: RE | Admit: 2023-09-19 | Discharge: 2023-09-19 | Disposition: A | Discharge status: Home / Self Care | Source: Ambulatory Visit | Attending: Cardiology | Admitting: Cardiology

## 2023-09-19 DIAGNOSIS — Z950 Presence of cardiac pacemaker: Secondary | ICD-10-CM | POA: Insufficient documentation

## 2023-09-19 DIAGNOSIS — I2510 Atherosclerotic heart disease of native coronary artery without angina pectoris: Secondary | ICD-10-CM | POA: Insufficient documentation

## 2023-09-19 DIAGNOSIS — K449 Diaphragmatic hernia without obstruction or gangrene: Secondary | ICD-10-CM | POA: Diagnosis not present

## 2023-09-19 DIAGNOSIS — R0789 Other chest pain: Secondary | ICD-10-CM | POA: Diagnosis present

## 2023-09-19 DIAGNOSIS — I700 Atherosclerosis of aorta: Secondary | ICD-10-CM | POA: Insufficient documentation

## 2023-09-19 LAB — NM PET CT CARDIAC PERFUSION MULTI W/ABSOLUTE BLOODFLOW
LV dias vol: 51 mL (ref 46–106)
LV sys vol: 15 mL (ref 3.8–5.2)
MBFR: 2.64
Nuc Rest EF: 71 %
Nuc Stress EF: 75 %
Peak HR: 97 {beats}/min
Rest HR: 72 {beats}/min
Rest MBF: 0.9 ml/g/min
Rest Nuclear Isotope Dose: 21.4 mCi
SRS: 0
SSS: 0
ST Depression (mm): 0 mm
Stress MBF: 2.38 ml/g/min
Stress Nuclear Isotope Dose: 21.1 mCi
TID: 1

## 2023-09-19 MED ORDER — RUBIDIUM RB82 GENERATOR (RUBYFILL)
25.0000 | PACK | Freq: Once | INTRAVENOUS | Status: AC
  Administered 2023-09-19: 21.35 via INTRAVENOUS

## 2023-09-19 MED ORDER — REGADENOSON 0.4 MG/5ML IV SOLN
INTRAVENOUS | Status: AC
  Filled 2023-09-19: qty 5

## 2023-09-19 MED ORDER — RUBIDIUM RB82 GENERATOR (RUBYFILL)
25.0000 | PACK | Freq: Once | INTRAVENOUS | Status: AC
  Administered 2023-09-19: 21.12 via INTRAVENOUS

## 2023-09-19 MED ORDER — REGADENOSON 0.4 MG/5ML IV SOLN
0.4000 mg | Freq: Once | INTRAVENOUS | Status: AC
  Administered 2023-09-19: 0.4 mg via INTRAVENOUS
  Filled 2023-09-19: qty 5

## 2023-09-19 NOTE — Progress Notes (Signed)
 Patient presents for a cardiac PET stress test and tolerated procedure. She reported shortness of breath, nausea, and a headache that resolved by the conclusion of the test. Patient maintained acceptable vital signs throughout the test and was offered caffeine after test.  Patient ambulated out of department with a steady gait.

## 2023-09-20 ENCOUNTER — Ambulatory Visit: Payer: Self-pay | Admitting: Cardiology

## 2023-09-20 NOTE — Progress Notes (Signed)
 There was no evidence of ischemia and no evidence of infarct.  Study was considered normal and low risk.

## 2023-09-26 ENCOUNTER — Encounter: Payer: Self-pay | Admitting: Internal Medicine

## 2023-09-26 ENCOUNTER — Ambulatory Visit (INDEPENDENT_AMBULATORY_CARE_PROVIDER_SITE_OTHER): Admitting: Internal Medicine

## 2023-09-26 VITALS — BP 102/50 | HR 85 | Temp 97.9°F | Ht 62.0 in | Wt 185.2 lb

## 2023-09-26 DIAGNOSIS — G4733 Obstructive sleep apnea (adult) (pediatric): Secondary | ICD-10-CM | POA: Diagnosis not present

## 2023-09-26 NOTE — Progress Notes (Signed)
 Name: [name] MRN: [id number] DOB: 1945/06/22    CHIEF COMPLAINT:  Follow-up assessment for OSA   HISTORY OF PRESENT ILLNESS: Excellent compliance report Reviewed in detail with patient AHI well-controlled  Discussed sleep data and reviewed with patient.  Encouraged proper weight management.  Discussed driving precautions and its relationship with hypersomnolence.  Discussed sleep hygiene, and benefits of a fixed sleep waked time.  The importance of getting eight or more hours of sleep discussed with patient.  Discussed limiting the use of the computer and television before bedtime.  Decrease naps during the day, so night time sleep will become enhanced.  Limit caffeine, and sleep deprivation.   Patient uses and benefits from therapy Using CPAP nightly and with naps Pressure setting is comfortable and is sleeping well. 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  Previous  home PT has had back surgery in the past Right hemidiaphragm elevation   PAST MEDICAL HISTORY :   has a past medical history of Anemia, Anxiety, Aortic atherosclerosis (HCC), Arthritis, CAD (coronary artery disease), Chronic back pain, Chronic heart failure with preserved ejection fraction (HFpEF) (HCC), CKD (chronic kidney disease), stage III (HCC), Complication of anesthesia (2018), Constipation, DDD (degenerative disc disease), lumbar, Depression, Diverticulosis,  Dysplastic nevus (09/12/2017), Dysplastic nevus (09/18/2018), E coli infection, Elevated hemidiaphragm (RIGHT), GERD (gastroesophageal reflux disease), Headache, Hemorrhoids, History of 2019 novel coronavirus disease (COVID-19) (10/31/2022), History of bilateral cataract extraction, History of colon polyps, History of gout, History of hiatal hernia, History of loop recorder (10/17/2017), History of vertigo, Hyperlipidemia, Hypertension, Hypothyroidism, Insomnia, Left thyroid nodule, Long term current use of aspirin, Lymphedema, NSVT (nonsustained ventricular tachycardia) (HCC), OSA on CPAP, Paroxysmal atrial tachycardia (HCC), Presence of 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  has a past surgical history that includes Tonsillectomy; Cholecystectomy; Cataract extraction w/ intraocular lens implant; Esophagogastroduodenoscopy (egd) with propofol (N/A, 11/01/2014); Savory dilation (N/A, 11/01/2014); Total abdominal hysterectomy w/ bilateral salpingoophorectomy; LEFT HEART CATH AND CORONARY ANGIOGRAPHY (06/11/2007); Trigger finger release (Bilateral); Carpal tunnel release (Bilateral); Rotator cuff repair (Right); Dilation and curettage of uterus; Diagnostic laparoscopy; Colonoscopy; Laminectomy (11/13/2015); Lumbar wound debridement (N/A, 12/02/2015); PICC LINE PLACE PERIPHERAL ([facility] HX); Open reduction internal fixation (orif) distal radial fracture (Left, 08/29/2016); Lumbar fusion (11/2015); Hardware Removal (Left, 10/11/2016); LEFT HEART CATH AND CORONARY ANGIOGRAPHY (Left, 10/01/2017); LOOP RECORDER INSERTION (N/A, 10/17/2017); Colonoscopy with propofol (N/A, 11/25/2018); Esophagogastroduodenoscopy (egd) with propofol (N/A, 11/25/2018); PACEMAKER IMPLANT (N/A, 03/04/2019); LOOP RECORDER REMOVAL (N/A, 03/04/2019); Knee arthroscopy (Right, 05/09/2020); Back surgery;  RIGHT/LEFT HEART CATH AND CORONARY ANGIOGRAPHY (N/A, 12/26/2021); Esophagogastroduodenoscopy (egd) with propofol (N/A, 07/11/2022); LEFT HEART CATH AND CORONARY ANGIOGRAPHY (Left, 07/25/2011); and Knee Arthroplasty (Right, 01/14/2023). Prior to Admission medications   Medication Sig Start Date End Date Taking? Authorizing Provider  Acetaminophen (TYLENOL ARTHRITIS PAIN PO) Take 1,300 mg by mouth every 8 (eight) hours as needed (pain).   Yes [provider]  albuterol (VENTOLIN HFA) 108 (90 Base) MCG/ACT  inhaler Inhale 1 puff into the lungs every 4 (four) hours as needed. 01/23/22 01/23/23 Yes [provider]  aspirin 81 MG tablet Take 81 mg by mouth at bedtime.    Yes [provider]  atenolol (TENORMIN) 25 MG tablet Take 1 tablet (25 mg total) by mouth every morning. 12/27/21  Yes Sreenath, Sudheer B, MD  Azelaic Acid (FINACEA) 15 % FOAM Apply 1 application. topically as directed. Qd to bid to aa face 06/29/21  Yes Hester Alm BROCKS, MD  azelastine (ASTELIN) 0.1 % nasal spray Place 2 sprays into both nostrils 2 (two) times daily as needed for rhinitis.   Yes [provider]  cholecalciferol (VITAMIN D3) 25 MCG (1000 UNIT) tablet Take 1,000 Units by mouth daily.   Yes [provider]  diclofenac Sodium (VOLTAREN) 1 % GEL Apply 2 g topically 2 (two) times daily.   Yes [provider]  empagliflozin (JARDIANCE) 10 MG TABS tablet Take 10 mg by mouth daily. 12/15/21 12/15/22 Yes [provider]  esomeprazole (NEXIUM) 40 MG capsule Take 40 mg by mouth 2 (two) times daily.   Yes [provider]  gabapentin (NEURONTIN) 300 MG capsule Take 300 mg by mouth 2 (two) times daily.   Yes [provider]  Insulin Aspart (NOVOLOG FLEXPEN [place]) Inject 10 Units into the skin as needed (diabetes).   Yes [provider]  LANTUS SOLOSTAR 100 UNIT/ML Solostar Pen Inject 28 Units into the skin every morning. 06/27/20  Yes [provider]  levothyroxine (SYNTHROID, LEVOTHROID) 75 MCG tablet Take 75 mcg by mouth daily before breakfast.  11/24/12  Yes [provider]  methocarbamol (ROBAXIN) 500 MG tablet Take 500 mg by mouth every 8 (eight) hours as needed for muscle spasms.   Yes [provider]  metolazone (ZAROXOLYN) 2.5 MG tablet Take 2.5 mg by mouth daily as needed (fluid overload). 12/29/21  Yes [provider]  mometasone (ELOCON) 0.1 % cream Apply 1 application topically daily as needed (Rash). Qd to bid up to 5 days a week aa right lower leg prn itching 06/22/20  Yes Hester Alm BROCKS, MD  nitroGLYCERIN (NITROSTAT) 0.4 MG SL tablet Place 1 tablet (0.4 mg total) under the tongue every 5 (five) minutes as needed for chest pain. 07/13/21 01/25/23 Yes Gollan, Timothy J, MD  ondansetron (ZOFRAN) 4 MG tablet Take 4 mg by mouth every 8 (eight) hours as needed for nausea or vomiting.  07/19/17  Yes [provider]  ondansetron (ZOFRAN-ODT) 4 MG disintegrating tablet Take 1 tablet (4 mg total) by mouth every 6 (six) hours as needed for nausea or vomiting. 05/06/21  Yes Dicky Anes, MD  potassium chloride (KLOR-CON) 10 MEQ tablet Take 2 tablet (20 meq) by mouth twice daily as directed 03/05/22  Yes Gollan, Evalene PARAS, MD  PRALUENT 150 MG/ML SOAJ INJECT 150 MG UNDER THE SKIN EVERY 14 DAYS 12/28/21  Yes Gollan, Timothy J, MD  ranolazine (RANEXA) 500 MG 12 hr tablet Take 2 tablets (1,000 mg total) by mouth 2 (two) times daily. 08/18/21  Yes Dunn, Bernardino HERO, PA-C  ropinirole (REQUIP) 5 MG tablet Take 5 mg by mouth at bedtime.   Yes [provider]  temazepam (RESTORIL) 30 MG capsule Take 30 mg by mouth at bedtime.   Yes [provider]  torsemide (DEMADEX) 20 MG tablet Take 2 tablets (40 mg total) by mouth 2 (two) times daily. As directed 02/27/22  Yes Gollan, Timothy J, MD  traMADol (ULTRAM) 50 MG  tablet Take 50 mg by mouth every 6 (six) hours as needed for moderate pain or severe pain.    Yes [provider]  trolamine salicylate (ASPERCREME) 10 % cream Apply 1 application topically at bedtime.   Yes [provider]   Allergies  Allergen Reactions   Ezetimibe Diarrhea and Nausea Only   Hydrocodone-Acetaminophen Other (See Comments)   Metoclopramide Diarrhea   Propoxyphene Other (See Comments)    Unsure of reaction type Unsure of reaction type Unsure of reaction type   Cefuroxime    Hydrocodone Other (See Comments)   Tramadol Hives   Ceftin [Cefuroxime Axetil] Diarrhea   Codeine Rash        Penicillins Rash and Other (See Comments)    IgE = 22 (WNL) on 01/03/2023 Has patient had a PCN reaction causing immediate rash, facial/tongue/throat swelling, SOB or lightheadedness with hypotension: NO Has patient had a PCN reaction causing severe rash involving mucus membranes or skin necrosis: NO Has patient had a PCN retioion that required hospitalization NO Has patient had a PCN reaction occurring within the last 10 years: NO If all of the above answers are NO, then may proceed with Cephalosporin use.   Statins Other (See Comments)    Leg pain Leg pain Other reaction(s): restless leg syndrome   Vicodin [Hydrocodone-Acetaminophen] Other (See Comments)    passes out   BP (!) 102/50 (BP Location: Right Arm, Patient Position: Sitting, Cuff Size: Normal) Comment: pt has diastolic heart failure  Pulse 85   Temp 97.9 F (36.6 C) (Oral)   Ht 5' 2 (1.575 m)   Wt 185 lb 3.2 oz (84 kg)   SpO2 96%   BMI 33.87 kg/m      Review of Systems: Gen:  Denies  fever, sweats, chills weight loss  HEENT: Denies blurred vision, double vision, ear pain, eye pain, hearing loss, nose bleeds, sore throat Cardiac:  No dizziness, chest pain or heaviness, chest tightness,edema, No JVD Resp:   No cough, -sputum production, -shortness of breath,-wheezing, -hemoptysis,  Other:  All other systems negative   Physical Examination:   General Appearance: No distress  EYES  PERRLA, EOM intact.   NECK Supple, No JVD Pulmonary: normal breath sounds, No wheezing.  CardiovascularNormal S1,S2.  No m/r/g.   Abdomen: Benign, Soft, non-tender. Neurology UE/LE 5/5 strength, no focal deficits Ext pulses intact, cap refill intact ALL OTHER ROS ARE NEGATIVE     ASSESSMENT AND PLAN SYNOPSIS  78 year old pleasant white female seen today for asthma for sleep apnea with underlying diagnosis of early disease with multiple surgeries in the past right sided elevated hemidiaphragm with underlying restrictive physiology     Assessment of OSA Continue CPAP as prescribed  Excellent compliance report Reviewed compliance report in detail with patient Patient definitely benefits the use of CPAP therapy as prescribed Using CPAP nightly and with naps Pressure setting is comfortable and is sleeping well. CPAP prescription 5-20 AHI reduced to 0.8  No evidence of acute heart failure at this time No respiratory distress No fevers, chills, nausea, vomiting, diarrhea No evidence hemoptysis  Patient Instructions Continue to use CPAP every night, minimum of 4-6 hours a night.  Change equipment every 30 days or as directed by DME.  Wash your tubing with warm soap and water daily, hang to dry. Wash humidifier portion weekly. Use bottled, distilled water and change daily   Be aware of reduced alertness and do not drive or operate heavy machinery if experiencing this or drowsiness.  Exercise  encouraged, as tolerated. Encouraged proper weight management.  Important to get eight or more hours of sleep  Limiting the use of the computer and television before bedtime.  Decrease naps during the day, so night time sleep will become enhanced.  Limit caffeine, and sleep deprivation.  HTN, stroke, uncontrolled diabetes and heart failure are potential risk factors.  Risk of untreated sleep apnea including cardiac arrhthymias, stroke, DM, pulm HTN.      THYROID NODULE Being followed by  Dr. Cherilyn endocrinology   Obesity -recommend significant weight loss -recommend changing diet Currently on Ozempic  Deconditioned state -Recommend increased daily activity and exercise   CAD + diastolic dysfunction. 2/2 pulmonary hypertension ( high wedge).  She has a pacemaker in place Following cardiology recs (Dr. Gollan/Klein)    MEDICATION ADJUSTMENTS/LABS AND TESTS ORDERED: Continue CPAP as prescribed Avoid Allergens and Irritants Avoid secondhand smoke Avoid SICK contacts Recommend  Masking  when appropriate Recommend Keep up-to-date with vaccinations Follow-up with cardiology as scheduled   CURRENT MEDICATIONS REVIEWED AT LENGTH WITH PATIENT TODAY   Patient  satisfied with Plan of action and management. All questions answered   Follow up 1 year   I spent a total of 45 minutes dedicated to the care of this patient on the date of this encounter to include pre-visit review of records, face-to-face time with the patient discussing conditions above, post visit ordering of testing, clinical documentation with the electronic health record, making appropriate referrals as documented, and communicating necessary information to the patient's healthcare team.    The Patient requires high complexity decision making for assessment and support, frequent evaluation and titration of therapies, application of advanced monitoring technologies and extensive interpretation of multiple databases.  Patient satisfied with Plan of action and management. All questions answered    Nickolas Alm Cellar, M.D.  [facility]  Medical Director [facility] Medical Director [facility] Cardio-Pulmonary Department

## 2023-09-26 NOTE — Patient Instructions (Signed)

## 2023-10-08 ENCOUNTER — Other Ambulatory Visit: Payer: Self-pay

## 2023-10-09 ENCOUNTER — Ambulatory Visit: Attending: Cardiology | Admitting: Cardiology

## 2023-10-09 ENCOUNTER — Encounter: Payer: Self-pay | Admitting: Cardiology

## 2023-10-09 VITALS — BP 158/62 | HR 87 | Ht 62.0 in | Wt 187.2 lb

## 2023-10-09 DIAGNOSIS — I4719 Other supraventricular tachycardia: Secondary | ICD-10-CM | POA: Diagnosis present

## 2023-10-09 DIAGNOSIS — G4733 Obstructive sleep apnea (adult) (pediatric): Secondary | ICD-10-CM | POA: Insufficient documentation

## 2023-10-09 DIAGNOSIS — I10 Essential (primary) hypertension: Secondary | ICD-10-CM | POA: Diagnosis not present

## 2023-10-09 DIAGNOSIS — I5032 Chronic diastolic (congestive) heart failure: Secondary | ICD-10-CM | POA: Diagnosis not present

## 2023-10-09 DIAGNOSIS — E785 Hyperlipidemia, unspecified: Secondary | ICD-10-CM | POA: Diagnosis present

## 2023-10-09 DIAGNOSIS — E119 Type 2 diabetes mellitus without complications: Secondary | ICD-10-CM | POA: Insufficient documentation

## 2023-10-09 DIAGNOSIS — Z794 Long term (current) use of insulin: Secondary | ICD-10-CM | POA: Diagnosis present

## 2023-10-09 DIAGNOSIS — I25118 Atherosclerotic heart disease of native coronary artery with other forms of angina pectoris: Secondary | ICD-10-CM | POA: Insufficient documentation

## 2023-10-09 DIAGNOSIS — R0602 Shortness of breath: Secondary | ICD-10-CM | POA: Diagnosis not present

## 2023-10-09 MED ORDER — ATENOLOL 25 MG PO TABS: 25.0000 mg | ORAL_TABLET | Freq: Two times a day (BID) | ORAL | 0 refills | Status: AC

## 2023-10-09 NOTE — Progress Notes (Signed)
 Cardiology Office Note   Date:  10/09/2023  ID:  SAMARRA RIDGELY, DOB 1945-05-25, MRN [id number] PCP: Cleotilde Oneil FALCON, MD   [facility] Providers Cardiologist:  Evalene Lunger, MD Cardiology APP:  Gerard Frederick, NP  Electrophysiologist:  Elspeth Sage, MD  Electrophysiology APP:  Riddle, Suzann, NP     History of Present Illness [name] is a 78 y.o. 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   Previously done, remote catheterization 07/2011 which showed left dominant system with moderate mid LAD and proximal D1, proximal RCA disease estimated 50% stenosis with normal LV systolic function.  She was evaluated for chest pain in 2018 with and underwent MPI which was considered low risk scan.  Coronary CTA was equivocal and concerning for LAD disease.  Subsequent left heart cath showed 80% ostial diagonal stenosis to 50% proximal to mid LAD stenosis.  Medical management was recommended.  In 2021 she underwent permanent pacemaker plantation) syncope with loop monitor showing prolonged bradycardia with pauses of up to 13 seconds.  In 08/2020 she was admitted with chest pain and ruled out.  CT of the chest was negative for PE.  Echocardiogram showed EF 60 to 65%, no RWMA, G2 DD, and no significant valvular abnormalities.  Lexiscan  MPI at that time showed no significant ischemia with an EF of 67%.  Overall was low risk study.  08/04/2021 she was seen in clinic noting a 2-week history of constant substernal chest pain that was exacerbated with ambulation associated with diaphoresis, dyspnea, nausea and flushing.  EKG showed sinus rhythm with nonspecific anterior ST/T wave changes.  She continued to have  ongoing chest pain episode to the emergency department for further evaluation.  High-sensitivity troponin of 3 with delta troponin of less than 2.  She declined admission.  Imdur  was titrated to 30 mg daily.  Subsequent coronary CTA in 08/10/2021 showed calcium  score of 897 which was 92nd percentile for age and sex matched control.  Moderate calcified plaque in the proximal LAD and D1, estimated at 50 to 69% stenosis as well as mild proximal RCA and mid left circumflex calcified stenosis with CT FFR being negative.  She was evaluated by Dr. Gollan in clinic 09/25/2021 with complaints of lower extremity edema, tachycardia, chest pain, shortness of breath.  She was restarted on low-dose Imdur  15 mg daily and atenolol  12.5 mg daily.  She was again evaluated in the [facility] emergency department 12/24/2021 for shortness of breath.  She reported over the last month that she did shortness of breath seem to be worsening.  She continued to have chest pain.  Echocardiogram revealed an LVEF of 55 to 60%, no RWMA, mild LVH, G2 DD, mild mitral valve regurgitation.  Right and left heart catheterization completed on 12/26/2021 revealed stable coronary arteries with severe stenosis involving the proximal portion of the small D1 branch and otherwise mild to moderate nonobstructive disease, mildly elevated left filling pressures, moderately to severely elevated right heart filling pressures.  She was continued with medical therapy as the T1 was not well-suited for PCI.  She was diuresed and had SGLT2 inhibitor added to her medication regimen.  She was evaluated in clinic 12/07/2022.  At this time she is shortness of breath with exertion.  She previously been  admitted to [facility] in September with COVID.  She has noted to have a large burden of PVCs at that time.  She was considered acceptable risk for general knee replacement no further cardiac testing.  There were no medication changes that were made.  She was seen in clinic 08/23/2023  with varying symptoms of atypical chest pain and jaw pain.  She has taken nitro given relief approximately 4 separate times and continues to have some complaints of numbness in her feet status post knee replacement.  She has been working on weight loss.  She was scheduled for a cardiac PET stress to evaluate her discomfort and jaw pain relieved with nitro.  Cardiac PET showed LV perfusion is normal with no evidence of ischemia no evidence of infarct.   She was last seen in clinic by EP on 08/29/2023.  She had not had any further discomfort.  Her device was interrogated with no changes that were made.  She was continued on her current medication regimen with no further changes or other testing that was ordered at that time.  She returns to clinic today with concerns of elevated blood pressure, headaches due to blood pressure, continued shortness of breath, and fatigue.  She states that she continues to follow with pulmonary for obstructive sleep apnea CPAP which she just recently was evaluated by Dr. Isaiah.  She continues to have noted weight loss.  She has been compliant with her current medication regimen without any undue side effects.  Denies any recent hospitalizations or visits to the emergency department.  ROS: 10 point review of systems has been reviewed and considered negative except ones are listed in the HPI  Studies Reviewed EKG Interpretation Date/Time:  Wednesday October 09 2023 09:58:29 EDT Ventricular Rate:  87 PR Interval:  156 QRS Duration:  80 QT Interval:  362 QTC Calculation: 435 R Axis:   22  Text Interpretation: Normal sinus rhythm Low voltage QRS When compared with ECG of 23-Aug-2023 11:24, No significant change was found Confirmed by Gerard Frederick ([id number]) on 10/09/2023 10:03:54 AM   Cardiac PET stress 09/19/2023    LV perfusion is normal. There is no evidence of ischemia. There is no evidence of infarction.   Rest left ventricular function is normal. Rest EF: 71%. Stress left  ventricular function is normal. Stress EF: 75%. End diastolic cavity size is normal. End systolic cavity size is normal.   Myocardial blood flow was computed to be 0.31ml/g/min at rest and 2.24ml/g/min at stress. Global myocardial blood flow reserve was 2.64 and was normal.   Coronary calcium  was present on the attenuation correction CT images. Moderate coronary calcifications were present. Coronary calcifications were present in the left anterior descending artery and left circumflex artery distribution(s).   The study is normal. The study is low risk.   [facility] 12/26/2021 Conclusions: Stable appearance of coronary arteries with severe stenosis involving proximal portion of small D1 branch and otherwise mild-moderate, non-obstructive disease. Mildly elevated left heart filling pressures. Moderately-severely elevated right heart filling pressures. Normal Fick cardiac output/index.   Recommendations: Continue medical therapy of CAD.  D1 is not well-suited for PCI. Consider gentle diuresis and addition of SGLT-2 inhibitor if renal function remains stable/improved.   2D echo 12/26/2021  1. Left ventricular ejection fraction, by estimation, is 55 to 60%. The  left ventricle has normal function. The left ventricle has no regional  wall motion abnormalities. There is mild left ventricular hypertrophy.  Left ventricular diastolic parameters  are consistent with Grade II  diastolic dysfunction (pseudonormalization).  Elevated left atrial pressure.   2. Right ventricular systolic function is normal. The right ventricular  size is normal.   3. The mitral valve is degenerative. Mild mitral valve regurgitation. No  evidence of mitral stenosis.   4. The aortic valve is tricuspid. There is mild calcification of the  aortic valve. There is mild thickening of the aortic valve. Aortic valve  regurgitation is not visualized. Aortic valve sclerosis/calcification is  present, without any evidence of  aortic  stenosis.  Risk Assessment/Calculations   HYPERTENSION CONTROL Vitals:   10/09/23 0953 10/09/23 1008  BP: (!) 166/78 (!) 158/62    The patient's blood pressure is elevated above target today.  In order to address the patient's elevated BP: A current anti-hypertensive medication was adjusted today.          Physical Exam VS:  BP (!) 158/62 (BP Location: Left Arm, Patient Position: Sitting, Cuff Size: Normal)   Pulse 87   Ht 5' 2 (1.575 m)   Wt 187 lb 3 oz (84.9 kg)   SpO2 97%   BMI 34.24 kg/m        Wt Readings from Last 3 Encounters:  10/09/23 187 lb 3 oz (84.9 kg)  09/26/23 185 lb 3.2 oz (84 kg)  09/19/23 182 lb (82.6 kg)    GEN: Well nourished, well developed in no acute distress NECK: No JVD; No carotid bruits CARDIAC: RRR, no murmurs, rubs, gallops RESPIRATORY:  Clear to auscultation without rales, wheezing or rhonchi  ABDOMEN: Soft, non-tender, non-distended EXTREMITIES: Trace pretibial edema; No deformity   ASSESSMENT AND PLAN Coronary artery disease of the native coronary arteries without reoccurrence of chest discomfort with the use of Nitrostat .  EKG today reveals sinus rhythm with rate 87 with no ischemic changes noted.  She had previously been scheduled for cardiac PET stress testing which showed no evidence of ischemia nor evidence of infarction with normal LV perfusion study was considered low risk.  Her last heart catheterization was completed in 12/2021.  She has been continued on aspirin  81 mg daily, Praluent  injection 150 mg every 2 weeks, Ranexa  1000 mg twice daily.  No further ischemic testing needed at this time.  Chronic HFpEF with last LVEF of 55 to 60%, no RWMA, LVH that was mild, G2 DD, mild MR.  Shortness of breath has been ongoing.  She appears to be euvolemic on exam today.  She is continued on her torsemide , Jardiance .  With elevated serum creatinine around 1.5 she was not initiated on MRA therapy today.  Shortness of breath that she states is  not improving.  She had originally thought with losing weight that her shortness of breath would improve and needed for short period of time but now she continues to have progressive shortness of breath.  She states she follows with pulmonary for CPAP management for obstructive sleep apnea and previously had been followed by [facility] pulmonary for a high hemidiaphragm making expiration difficult.  She has not had follow-up with pulmonary in quite some time for shortness of breath.  Referral is being sent back to pulmonary for shortness of breath as she is an established patient for OSA.  Last right heart catheterization was completed 12/2021 which showed moderately severely elevated right heart filling pressures and mildly elevated left heart filling pressures.  She was continued with increased diuretic therapy and started on SGLT2 inhibitor.  Hypertension with a blood pressure that has been elevated 166/78 and repeat blood pressure 158/62.  Blood  pressure at home and medication regimen has slightly been increasing.  She has been continued on atenolol  with increasing to 25 mg twice daily and torsemide  20 mg daily.  She has been encouraged to continue to monitor her blood pressure 1 to 2 hours postmedication administration at home as well.  Hyperlipidemia with statin intolerance where she is continued on Praluent  injection 150 mg every 2 weeks.  LDL was 42 which remains at goal of less than 55.  Atrial tachycardia/symptomatic bradycardia/syncopal episodes with permanent pacemaker in place.  She continues to be followed by EP.  Last interrogation showed no changes noted on histograms.  She has been encouraged to continue with follow-up with EP.  Type 2 diabetes where she is continued on Jardiance  and insulin .  Ongoing management per PCP.  Obstructive sleep apnea which she states she continues to remain compliant with her CPAP.  Ongoing management per pulmonary.       Dispo: Patient to return to clinic to see  MD/APP in 3 months or sooner if needed for further evaluation.  Signed, Torryn Fiske, NP

## 2023-10-09 NOTE — Patient Instructions (Signed)
 Medication Instructions:  Your physician recommends the following medication changes.  INCREASE: Atenolol  to 25 mg by mouth twice a day   *If you need a refill on your cardiac medications before your next appointment, please call your pharmacy*  Lab Work: No labs ordered today    Testing/Procedures: No test ordered today   Follow-Up: At [facility], you and your health needs are our priority.  As part of our continuing mission to provide you with exceptional heart care, our providers are all part of one team.  This team includes your primary Cardiologist (physician) and Advanced Practice Providers or APPs (Physician Assistants and Nurse Practitioners) who all work together to provide you with the care you need, when you need it.  Your next appointment:   3 month(s)  Provider:   Timothy Gollan, MD or Tylene Lunch, NP

## 2023-10-10 ENCOUNTER — Ambulatory Visit (INDEPENDENT_AMBULATORY_CARE_PROVIDER_SITE_OTHER): Admitting: Internal Medicine

## 2023-10-10 ENCOUNTER — Encounter: Payer: Self-pay | Admitting: Internal Medicine

## 2023-10-10 VITALS — BP 118/60 | HR 73 | Temp 98.1°F | Ht 62.0 in | Wt 188.4 lb

## 2023-10-10 DIAGNOSIS — J4540 Moderate persistent asthma, uncomplicated: Secondary | ICD-10-CM

## 2023-10-10 DIAGNOSIS — R0602 Shortness of breath: Secondary | ICD-10-CM

## 2023-10-10 MED ORDER — IPRATROPIUM-ALBUTEROL 0.5-2.5 (3) MG/3ML IN SOLN
3.0000 mL | Freq: Once | RESPIRATORY_TRACT | Status: AC
  Administered 2023-10-10: 3 mL via RESPIRATORY_TRACT

## 2023-10-10 MED ORDER — BUDESONIDE-FORMOTEROL FUMARATE 160-4.5 MCG/ACT IN AERO: 2.0000 | INHALATION_SPRAY | Freq: Two times a day (BID) | RESPIRATORY_TRACT | 12 refills | Status: DC

## 2023-10-10 MED ORDER — BUDESONIDE-FORMOTEROL FUMARATE 160-4.5 MCG/ACT IN AERO: 2.0000 | INHALATION_SPRAY | Freq: Two times a day (BID) | RESPIRATORY_TRACT | 12 refills | Status: AC

## 2023-10-10 NOTE — Progress Notes (Signed)
 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  Previous  home PT has had back surgery in the past Right hemidiaphragm elevation  Patient with progressive shortness of breath dyspnea on exertion Patient with a previous history of right hemidiaphragm elevation Patient with history of coronary artery disease and diastolic heart failure   Resting  pulse oximetry is 99% on room air Ambulating pulse oximetry in the office did not reveal any significant hypoxia but she was wheezing, plan for in office NEB THERAPY Plan to start inhaled steroids and LABA   PAST MEDICAL HISTORY :   has a past medical history of Anemia, Anxiety, Aortic atherosclerosis (HCC), Arthritis, CAD (coronary artery disease), Chronic back pain, Chronic heart failure with preserved ejection fraction (HFpEF) (HCC), CKD (chronic kidney disease), stage III (HCC), Complication of anesthesia (2018), Constipation, DDD (degenerative disc disease), lumbar, Depression, Diverticulosis, Dysplastic nevus (09/12/2017), Dysplastic nevus (09/18/2018), E coli infection, Eczema, Elevated hemidiaphragm (RIGHT), GERD (gastroesophageal reflux disease), Headache, Hemorrhoids, History of 2019 novel coronavirus disease (COVID-19) (10/31/2022), History of bilateral cataract extraction, History of colon polyps, History of gout, History of hiatal hernia, History of loop recorder (10/17/2017), History of vertigo, Hyperlipidemia, Hypertension, Hypothyroidism, Insomnia, Left thyroid nodule, Long term current use of aspirin , Lymphedema, NSVT (nonsustained ventricular tachycardia) (HCC), OSA on CPAP,  Paroxysmal atrial tachycardia (HCC), Presence of 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  has a past surgical history that includes Tonsillectomy; Cholecystectomy; Cataract extraction w/ intraocular lens implant; Esophagogastroduodenoscopy (egd) with propofol  (N/A, 11/01/2014); Savory dilation (N/A, 11/01/2014); Total abdominal hysterectomy w/ bilateral salpingoophorectomy; LEFT HEART CATH AND CORONARY ANGIOGRAPHY (06/11/2007); Trigger finger release (Bilateral); Carpal tunnel release (Bilateral); Rotator cuff repair (Right); Dilation and curettage of uterus; Diagnostic laparoscopy; Colonoscopy; Laminectomy (11/13/2015); Lumbar wound debridement (N/A, 12/02/2015); PICC LINE PLACE PERIPHERAL ([facility] HX); Open reduction internal fixation (orif) distal radial fracture (Left, 08/29/2016); Lumbar fusion (11/2015); Hardware Removal (Left, 10/11/2016); LEFT HEART CATH AND CORONARY ANGIOGRAPHY (Left, 10/01/2017); LOOP RECORDER INSERTION (N/A, 10/17/2017); Colonoscopy with propofol  (N/A, 11/25/2018); Esophagogastroduodenoscopy (egd) with propofol  (N/A, 11/25/2018); PACEMAKER IMPLANT (N/A, 03/04/2019); LOOP RECORDER REMOVAL (N/A, 03/04/2019); Knee arthroscopy (Right, 05/09/2020); Back surgery; RIGHT/LEFT HEART CATH AND CORONARY ANGIOGRAPHY (N/A, 12/26/2021); Esophagogastroduodenoscopy (egd) with propofol  (N/A, 07/11/2022); LEFT HEART CATH AND CORONARY ANGIOGRAPHY (Left, 07/25/2011); and Knee Arthroplasty (Right, 01/14/2023). Prior to Admission medications   Medication Sig Start Date End Date Taking? Authorizing Provider  Acetaminophen  (TYLENOL  ARTHRITIS PAIN PO) Take 1,300 mg by mouth every 8 (eight) hours as needed (pain).   Yes [provider]  albuterol  (VENTOLIN  HFA) 108 (90 Base) MCG/ACT inhaler Inhale 1 puff into the lungs every 4 (four) hours as needed. 01/23/22  01/23/23 Yes [provider]  aspirin  81 MG tablet Take 81 mg by mouth at bedtime.    Yes [provider]  atenolol  (TENORMIN ) 25 MG tablet Take 1 tablet (25 mg total) by mouth every morning. 12/27/21  Yes Sreenath, Sudheer B, MD  Azelaic Acid  (FINACEA ) 15 % FOAM Apply 1 application. topically as directed. Qd to bid to aa face 06/29/21  Yes Hester Alm JAYSON, MD  azelastine  (ASTELIN ) 0.1 % nasal spray  Place 2 sprays into both nostrils 2 (two) times daily as needed for rhinitis.   Yes [provider]  cholecalciferol  (VITAMIN D3) 25 MCG (1000 UNIT) tablet Take 1,000 Units by mouth daily.   Yes [provider]  diclofenac Sodium (VOLTAREN) 1 % GEL Apply 2 g topically 2 (two) times daily.   Yes [provider]  empagliflozin  (JARDIANCE ) 10 MG TABS tablet Take 10 mg by mouth daily. 12/15/21 12/15/22 Yes [provider]  esomeprazole (NEXIUM) 40 MG capsule Take 40 mg by mouth 2 (two) times daily.   Yes [provider]  gabapentin  (NEURONTIN ) 300 MG capsule Take 300 mg by mouth 2 (two) times daily.   Yes [provider]  Insulin  Aspart (NOVOLOG  FLEXPEN [place]) Inject 10 Units into the skin as needed (diabetes).   Yes [provider]  LANTUS  SOLOSTAR 100 UNIT/ML Solostar Pen Inject 28 Units into the skin every morning. 06/27/20  Yes [provider]  levothyroxine  (SYNTHROID , LEVOTHROID) 75 MCG tablet Take 75 mcg by mouth daily before breakfast.  11/24/12  Yes [provider]  methocarbamol  (ROBAXIN ) 500 MG tablet Take 500 mg by mouth every 8 (eight) hours as needed for muscle spasms.   Yes [provider]  metolazone  (ZAROXOLYN ) 2.5 MG tablet Take 2.5 mg by mouth daily as needed (fluid overload). 12/29/21  Yes [provider]  mometasone  (ELOCON ) 0.1 % cream Apply 1 application topically daily as needed (Rash). Qd to bid up to 5 days a week aa right lower leg prn itching 06/22/20  Yes Hester Alm BROCKS, MD  nitroGLYCERIN  (NITROSTAT ) 0.4 MG SL tablet Place 1 tablet (0.4 mg total) under the tongue every 5 (five) minutes as needed for chest pain. 07/13/21 01/25/23 Yes Gollan, Timothy J, MD  ondansetron  (ZOFRAN ) 4 MG tablet Take 4 mg by mouth every 8 (eight) hours as needed for nausea or vomiting.  07/19/17  Yes [provider]  ondansetron  (ZOFRAN -ODT) 4 MG disintegrating tablet Take 1 tablet (4 mg total) by mouth every 6 (six) hours as needed for nausea or vomiting. 05/06/21  Yes Dicky Anes, MD  potassium chloride  (KLOR-CON ) 10 MEQ tablet Take 2 tablet (20 meq) by mouth twice daily as directed 03/05/22  Yes Gollan, Timothy J, MD  PRALUENT  150 MG/ML SOAJ INJECT 150 MG UNDER THE SKIN EVERY 14 DAYS 12/28/21  Yes Gollan, Timothy J, MD  ranolazine  (RANEXA ) 500 MG 12 hr tablet Take 2 tablets (1,000 mg total) by mouth 2 (two) times daily. 08/18/21  Yes Dunn, Bernardino HERO, PA-C  ropinirole  (REQUIP ) 5 MG tablet Take 5 mg by mouth at bedtime.   Yes [provider]  temazepam  (RESTORIL ) 30 MG capsule Take 30 mg by mouth at bedtime.   Yes [provider]  torsemide  (DEMADEX ) 20 MG tablet Take 2 tablets (40 mg total) by mouth 2 (two) times daily. As directed 02/27/22  Yes Gollan, Timothy J, MD  traMADol  (ULTRAM ) 50 MG tablet Take 50 mg by mouth every 6 (six) hours as needed for moderate pain or severe pain.   Yes [provider]  trolamine salicylate (ASPERCREME) 10 % cream Apply 1 application topically at bedtime.   Yes [provider]   Allergies  Allergen Reactions   Ezetimibe  Diarrhea and Nausea Only   Hydrocodone -Acetaminophen  Other (See Comments)   Metoclopramide  Diarrhea   Propoxyphene Other (See Comments)    Unsure of reaction type Unsure of reaction type Unsure of reaction type   Cefuroxime    Hydrocodone   Other (See Comments)   Tramadol  Hives   Ceftin [Cefuroxime Axetil] Diarrhea   Codeine Rash        Penicillins Rash and Other (See Comments)    IgE = 22  (WNL) on 01/03/2023 Has patient had a PCN reaction causing immediate rash, facial/tongue/throat swelling, SOB or lightheadedness with hypotension: NO Has patient had a PCN reaction causing severe rash involving mucus membranes or skin necrosis: NO Has patient had a PCN retioion that required hospitalization NO Has patient had a PCN reaction occurring within the last 10 years: NO If all of the above answers are NO, then may proceed with Cephalosporin use.   Statins Other (See Comments)    Leg pain Leg pain Other reaction(s): restless leg syndrome   Vicodin [Hydrocodone -Acetaminophen ] Other (See Comments)    passes out   BP 118/60   Pulse 73   Temp 98.1 F (36.7 C)   Ht 5' 2 (1.575 m)   Wt 188 lb 6.4 oz (85.5 kg)   SpO2 99%   BMI 34.46 kg/m     Review of Systems:  Resp:   No cough, -sputum production, +shortness of breath,+wheezing, -hemoptysis,  Other:  All other systems negative   Physical Examination:   General Appearance: No distress  EYES PERRLA, EOM intact.   NECK Supple, No JVD Pulmonary: normal breath sounds, + wheezing.  CardiovascularNormal S1,S2.  No m/r/g.   Abdomen: Benign, Soft, non-tender. Neurology UE/LE 5/5 strength, no focal deficits Ext pulses intact, cap refill intact ALL OTHER ROS ARE NEGATIVE   ASSESSMENT AND PLAN SYNOPSIS  78 year old pleasant white female seen today for asthma for sleep apnea with underlying diagnosis of early disease with multiple surgeries in the past right sided elevated hemidiaphragm with underlying restrictive physiology  Patient with progressive shortness of breath  Assessment & Plan SOB (shortness of breath) Likely multifactorial, obesity, diastolic heart failure and CAD and deconditioned state with ASTHMA Moderate persistent asthma without complication Plan for in office NEB therapy Plan to start Symbicort  2 puffs twice a day Rinse mouth after use Avoid Allergens and Irritants Avoid secondhand smoke Avoid  SICK contacts Recommend  Masking  when appropriate Recommend Keep up-to-date with vaccinations    Obesity -recommend significant weight loss -recommend changing diet Currently on Ozempic  Deconditioned state -Recommend increased daily activity and exercise   CAD + diastolic dysfunction. 2/2 pulmonary hypertension ( high wedge).  She has a pacemaker in place Following cardiology recs (Dr. Lucinda)    MEDICATION ADJUSTMENTS/LABS AND TESTS ORDERED: Continue CPAP as prescribed Start Symbicort  Rinse mouth after use Avoid Allergens and Irritants Avoid secondhand smoke Avoid SICK contacts Recommend  Masking  when appropriate Recommend Keep up-to-date with vaccinations Follow-up with cardiology as scheduled   CURRENT MEDICATIONS REVIEWED AT LENGTH WITH PATIENT TODAY   Patient  satisfied with Plan of action and management. All questions answered   Follow up 3 months   I spent a total of 41 minutes dedicated to the care of this patient on the date of this encounter to include pre-visit review of records, face-to-face time with the patient discussing conditions above, post visit ordering of testing, clinical documentation with the electronic health record, making appropriate referrals as documented, and communicating necessary information to the patient's healthcare team.    The Patient requires high complexity decision making for assessment and support, frequent evaluation and titration of therapies, application of advanced monitoring technologies and extensive interpretation of multiple databases.  Patient satisfied with Plan of action and management. All questions  answered    Nickolas Alm Cellar, M.D.  [facility]  Medical Director [facility] Medical Director [facility] Cardio-Pulmonary Department

## 2023-10-10 NOTE — Patient Instructions (Signed)
 Continue CPAP as prescribed Start Symbicort  2 puffs in the morning 2 puffs at night Please rinse mouth after use  Avoid Allergens and Irritants Avoid secondhand smoke Avoid SICK contacts Recommend  Masking  when appropriate Recommend Keep up-to-date with vaccinations

## 2023-10-25 NOTE — Progress Notes (Signed)
 Referring Physician:  Cleotilde Oneil FALCON, MD [address] [facility] [address] [facility] [place],  [place] [address]  Primary Physician:  Cleotilde Oneil FALCON, MD  History of Present Illness: 10/25/2023 [name] has a history of osteoporosis, CHF, CKD stage 3, CAD, DM, GERD, HTN, obesity. TLS, OSA with CPAP.   She has a pacemaker.   She has history of revision PSF/XLIF L2-L3 with Dr. Clois on 08/10/19.   Last seen by Dr. Claudene on 07/15/23 for right sciatic neuropathy s/p right TKA. No compression seen on EMG. He did not recommend further surgery.   Last did phone visit with me on 09/13/23 to review her lumbar imaging. She continued with right LBP with right leg pain.   She has known vacuum disc T11-L1 along with vacuum disc L1-L2 with disc bulging and mild right lateral recess stenosis. She is fused from L2-S1.   She was sent to PT and was to get scheduled ESI with Dr. Avanell.   She had right L1-L2 TF and right L4-L5 TF ESI with Dr. Avanell on 09/17/23. He also did right GTB injection on 10/16/23.   She is here for follow up.   She had improvement with above injection. Dr. Avanell advised her to hold PT for 2 weeks due to increased pain. She was discharged from PT on 10/24/23.   She is doing better, but still has constant right sided LBP with radiation to her groin. Lateral right leg pain has improved. She has noted improvement in right hip pain with GTB injection. Pain is worse with standing, walking, and bending. When pain is severe, she feels weakness in her legs. Some numbness noted in right leg that is chronic.   Last HgbA1c on 06/10/23 was 5.5.   Tobacco use: Does not smoke.   Conservative measures:  Physical therapy: initial evaluation with [facility] on 09/24/23 and was discharged on 10/24/23.  Multimodal medical therapy including regular antiinflammatories:  Tylenol , Voltaren Gel,Gabapentin , Oxycodone  Injections:  09/17/23: Right L1-2 and right L4-5 transforaminal ESI   06/27/2023: Right L1-2 and right L4-5 transforaminal ESI (60% relief)  03/14/2023: Right L1-2 and right L4-5 transforaminal ESI 12/05/2022: Right L1-2 and right L4-5 transforaminal ESI (60% relief, [place] location)  06/29/2022: Right L1-2 and right L4-5 transforaminal ESI (over 70% relief) 02/20/2022: Right L1-2 and right L4-5 transforaminal ESI (over 70% relief)  11/14/2021: Right L1-2 and right L4-5 transforaminal ESI (initially 70% relief then sustained greater than 50% relief) 07/20/2021: Right L1-2 and right L4-5 transforaminal ESI (70% relief) 04/18/2021: Right L1-2 and right L5-S1 transforaminal ESI (70% relief) 01/17/2021: Right L1-2 transforaminal ESI (over 60% relief) 11/30/2020: Right L1-2 transforaminal ESI (25% relief) 08/01/2020: Right sacrococcygeal injection (good relief of tailbone pain) 08/10/2019: L2-3 XLIF (Dr. Clois) 04/23/2019: Right L2-3 transforaminal ESI (relief of upper back pain, no relief of L3 radicular symptoms; right L3-4 transforaminal approach was aborted due to tachycardia, lightheadedness, O2 sat of 89%) 03/24/2019: Right sacroiliac joint injection under ultrasound 01/20/2019: Right L2-3 and right L3-4 transforaminal ESI (moderate to good relief x6 weeks) 12/05/2018: Right L2-3 and right L3-4 transforaminal ESI (moderate relief)    Past Surgery:  Right knee replacement 02/26/23 Dr. Mardee DOS 08/10/19 - L2-3 XLIF, PSF (revision) DOS 08/24/19 - I and D and revision of posterior DOS 09/14/19 - debridement by Dr. Delores  The symptoms are causing a significant impact on the patient's life.   Review of Systems:  A 10 point review of systems is negative, except for the pertinent positives and  negatives detailed in the HPI.  Past Medical History: Past Medical History:  Diagnosis Date   Anemia    Anxiety    Aortic atherosclerosis (HCC)    Arthritis    CAD (coronary artery disease)    a.) LHC 06/11/2007: 30% D1, 30% pRCA - med mgmt; b.) LHC 07/25/2011:  50% mLAD, 50% D1, 50% pRCA - med mgmt; c.) LHC 10/01/2017: 50% p-mLAD, 80% D1 - med mgmt; d.) R/LHC 12/26/2021: 50% p-mLAD, 80% oD1-D1. mRA 15, mPA 29, mPWCP 25, AO sat 94%, PA sat 69%, CO 6.0, CI 3.1, LVEDP 20   Chronic back pain    Chronic heart failure with preserved ejection fraction (HFpEF) (HCC)    a.) TTE 07/26/2017: EF 60-65%, no RWMAs, mild LVH, G1DD; b.) TTE 07/17/2017: EF 60-65%, no RWMAs, G2DD, norm RVSF; c.) TTE 09/08/2020: EF 60-65%, no RWMAs, G2DD, norm RVSF; d.) TTE 12/26/2021: EF 55-60%, mild LVH, G2DD, mild MR, AoV sclerosis without stenosis   CKD (chronic kidney disease), stage III (HCC)    Complication of anesthesia 2018   a.) aspirated with supraglottoic airway removal following wrist surgery   Constipation    DDD (degenerative disc disease), lumbar    a.) s/p BILATERAL laminectomies with decompression and fusion L2-S1; total of 2 surgeries (2017 and 2021) with subsequent development of associated post-operative infections each time   Depression    Diverticulosis    Dysplastic nevus 09/12/2017   L sup buttock - mild    Dysplastic nevus 09/18/2018   R sup med buttocks - mild    E coli infection    Eczema    Elevated hemidiaphragm (RIGHT)    GERD (gastroesophageal reflux disease)    Headache    Hemorrhoids    History of 2019 novel coronavirus disease (COVID-19) 10/31/2022   History of bilateral cataract extraction    History of colon polyps    History of gout    History of hiatal hernia    History of loop recorder 10/17/2017   a.) LINQ ILR placed; b.) removed 03/04/2019   History of vertigo    Hyperlipidemia    Hypertension    Hypothyroidism    Insomnia    a.) on tamazepam qhs PRN   Left thyroid nodule    Long term current use of aspirin     Lymphedema    NSVT (nonsustained ventricular tachycardia) (HCC)    a.) in setting of acute respiratory failure secondary to AECOPD and CHF; aggressive diuresis + IV steroids led to NSVT + frequent PVCs --> Tx'd with  metoprolol  (significant fatigue with higher doses)   OSA on CPAP    Paroxysmal atrial tachycardia (HCC)    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   a.) s/p placement of Biotronik [place] 8 DR-T DC PPM on 03/04/2019   Restless leg syndrome    a.) on ropinirole    Rosacea    Statin intolerance    Symptomatic bradycardia    a.) s/p placement of Biotronik [place] 8 DR-T DC PPM on 03/04/2019   Syncope    a.) documented bradycardia and pauses --> s/p Biotronik [place] 8 DR-T DC PPM placement on 03/04/2019   T2DM (type 2 diabetes mellitus) (HCC)    Varicose veins     Past Surgical History: Past Surgical History:  Procedure Laterality Date   BACK SURGERY     CARPAL TUNNEL RELEASE Bilateral    CATARACT EXTRACTION W/ INTRAOCULAR LENS IMPLANT     CHOLECYSTECTOMY     COLONOSCOPY     COLONOSCOPY  WITH PROPOFOL  N/A 11/25/2018   Procedure: COLONOSCOPY WITH PROPOFOL ;  Surgeon: Toledo, Ladell POUR, MD;  Location: [facility]COPY;  Service: Gastroenterology;  Laterality: N/A;   DIAGNOSTIC LAPAROSCOPY     multiple times   DILATION AND CURETTAGE OF UTERUS     ESOPHAGOGASTRODUODENOSCOPY (EGD) WITH PROPOFOL  N/A 11/01/2014   Procedure: ESOPHAGOGASTRODUODENOSCOPY (EGD) WITH PROPOFOL ;  Surgeon: Deward CINDERELLA Piedmont, MD;  Location: [facility];  Service: Gastroenterology;  Laterality: N/A;   ESOPHAGOGASTRODUODENOSCOPY (EGD) WITH PROPOFOL  N/A 11/25/2018   Procedure: ESOPHAGOGASTRODUODENOSCOPY (EGD) WITH PROPOFOL ;  Surgeon: Toledo, Ladell POUR, MD;  Location: [facility];  Service: Gastroenterology;  Laterality: N/A;   ESOPHAGOGASTRODUODENOSCOPY (EGD) WITH PROPOFOL  N/A 07/11/2022   Procedure: ESOPHAGOGASTRODUODENOSCOPY (EGD) WITH PROPOFOL ;  Surgeon: Toledo, Ladell POUR, MD;  Location: [facility] END[facility];  Service: Gastroenterology;  Laterality: N/A;  IDDM   HARDWARE REMOVAL Left 10/11/2016   Procedure: HARDWARE REMOVAL-LEFT RADIUS;  Surgeon: Leora Lynwood SAUNDERS, MD;  Location: [facility] ORS;  Service: Orthopedics;  Laterality:  Left;  Left Radius Wrist    KNEE ARTHROPLASTY Right 01/14/2023   Procedure: COMPUTER ASSISTED TOTAL KNEE ARTHROPLASTY;  Surgeon: Mardee Lynwood SQUIBB, MD;  Location: [facility] OR[facility];  Service: Orthopedics;  Laterality: Right;   KNEE ARTHROSCOPY Right 05/09/2020   Procedure: ARTHROSCOPY KNEE;  Surgeon: Mardee Lynwood SQUIBB, MD;  Location: [facility];  Service: Orthopedics;  Laterality: Right;   LAMINECTOMY  11/13/2015   LEFT HEART CATH AND CORONARY ANGIOGRAPHY  06/11/2007   Procedure: LEFT HEART CATH AND CORONARY ANGIOGRAPHY; Location: [facility]; Surgeon: Toribio Fuel, MD   LEFT HEART CATH AND CORONARY ANGIOGRAPHY Left 10/01/2017   Procedure: LEFT HEART CATH AND CORONARY ANGIOGRAPHY;  Surgeon: Mady Bruckner, MD;  Location: [facility] INVASIVE CV LAB;  Service: Cardiovascular;  Laterality: Left;   LEFT HEART CATH AND CORONARY ANGIOGRAPHY Left 07/25/2011   Procedure: LEFT HEART CATH AND CORONARY ANGIOGRAPHY; Location: [facility]; Surgeon: Timpthy Gollan, MD   LOOP RECORDER INSERTION N/A 10/17/2017   Procedure: LOOP RECORDER INSERTION;  Surgeon: Fernande Elspeth BROCKS, MD;  Location: [facility] INVASIVE CV LAB;  Service: Cardiovascular;  Laterality: N/A;   LOOP RECORDER REMOVAL N/A 03/04/2019   Procedure: LOOP RECORDER REMOVAL;  Surgeon: Fernande Elspeth BROCKS, MD;  Location: [facility] INVASIVE CV LAB;  Service: Cardiovascular;  Laterality: N/A;   LUMBAR FUSION  11/2015   LUMBAR WOUND DEBRIDEMENT N/A 12/02/2015   Procedure: WOUND Exploration;  Surgeon: Gerldine Maizes, MD;  Location: [facility] OR;  Service: Neurosurgery;  Laterality: N/A;   OPEN REDUCTION INTERNAL FIXATION (ORIF) DISTAL RADIAL FRACTURE Left 08/29/2016   Procedure: OPEN REDUCTION INTERNAL FIXATION (ORIF) DISTAL RADIAL FRACTURE;  Surgeon: Leora Lynwood SAUNDERS, MD;  Location: [facility] ORS;  Service: Orthopedics;  Laterality: Left;   PACEMAKER IMPLANT N/A 03/04/2019   Procedure: PACEMAKER IMPLANT;  Surgeon: Fernande Elspeth BROCKS, MD;  Location: [facility] INVASIVE CV LAB;  Service: Cardiovascular;  Laterality: N/A;    PICC LINE PLACE PERIPHERAL ([facility] HX)     right upper arm    RIGHT/LEFT HEART CATH AND CORONARY ANGIOGRAPHY N/A 12/26/2021   Procedure: RIGHT/LEFT HEART CATH AND CORONARY ANGIOGRAPHY;  Surgeon: Mady Bruckner, MD;  Location: [facility] INVASIVE CV LAB;  Service: Cardiovascular;  Laterality: N/A;   ROTATOR CUFF REPAIR Right    SAVORY DILATION N/A 11/01/2014   Procedure: SAVORY DILATION;  Surgeon: Deward CINDERELLA Piedmont, MD;  Location: [facility] ENDOSCOPY;  Service: Gastroenterology;  Laterality: N/A;   TONSILLECTOMY     TOTAL ABDOMINAL HYSTERECTOMY W/ BILATERAL SALPINGOOPHORECTOMY     TRIGGER FINGER RELEASE Bilateral     Allergies: Allergies as  of 10/31/2023 - Review Complete 10/10/2023  Allergen Reaction Noted   Ezetimibe  Diarrhea and Nausea Only 07/19/2017   Hydrocodone -acetaminophen  Other (See Comments) 09/25/2021   Metoclopramide  Diarrhea 07/30/2017   Propoxyphene Other (See Comments) 11/24/2018   Cefuroxime  09/25/2021   Hydrocodone  Other (See Comments) 07/26/2020   Tramadol  Hives 08/08/2018   Ceftin [cefuroxime axetil] Diarrhea 11/04/2015   Codeine Rash 05/05/2010   Penicillins Rash and Other (See Comments) 05/05/2010   Statins Other (See Comments) 06/05/2013   Vicodin [hydrocodone -acetaminophen ] Other (See Comments) 10/23/2013    Medications: Outpatient Encounter Medications as of 10/31/2023  Medication Sig   Acetaminophen  (TYLENOL  ARTHRITIS PAIN PO) Take 1,300 mg by mouth every 8 (eight) hours as needed (pain).   albuterol  (PROVENTIL ) (2.5 MG/3ML) 0.083% nebulizer solution Take 3 mLs (2.5 mg total) by nebulization every 4 (four) hours as needed for wheezing or shortness of breath.   albuterol  (VENTOLIN  HFA) 108 (90 Base) MCG/ACT inhaler Inhale 2 puffs into the lungs every 6 (six) hours as needed.   aspirin  81 MG tablet Take 1 tablet (81 mg total) by mouth 2 (two) times daily.   atenolol  (TENORMIN ) 25 MG tablet Take 1 tablet (25 mg total) by mouth 2 (two) times daily.   budesonide -formoterol   (SYMBICORT ) 160-4.5 MCG/ACT inhaler Inhale 2 puffs into the lungs 2 (two) times daily.   cholecalciferol  (VITAMIN D3) 25 MCG (1000 UNIT) tablet Take 2,000 Units by mouth daily.   Crisaborole  (EUCRISA ) 2 % OINT Apply 1 Application topically as directed. qd to bid to aa eczema on back as needed for flares   desvenlafaxine (PRISTIQ) 50 MG 24 hr tablet Take 50 mg by mouth daily.   diclofenac Sodium (VOLTAREN) 1 % GEL Apply 2 g topically 2 (two) times daily as needed (pain).   doxycycline  (PERIOSTAT ) 20 MG tablet TAKE 1 TABLET BY MOUTH TWICE A DAY   Dupilumab  (DUPIXENT ) 300 MG/2ML SOAJ Inject 300 mg into the skin every 14 (fourteen) days. Starting at day 15 for maintenance. (Patient not taking: Reported on 10/10/2023)   empagliflozin  (JARDIANCE ) 10 MG TABS tablet Take 10 mg by mouth daily.   esomeprazole (NEXIUM) 40 MG capsule Take 40 mg by mouth 2 (two) times daily.   etodolac  (LODINE ) 400 MG tablet Take 400 mg by mouth 2 (two) times daily.   gabapentin  (NEURONTIN ) 300 MG capsule Take 300 mg by mouth 3 (three) times daily.   Insulin  Aspart (NOVOLOG  FLEXPEN [place]) Inject 10 Units into the skin daily as needed (when eating a high sugar meal).   LANTUS  SOLOSTAR 100 UNIT/ML Solostar Pen Inject 16 Units into the skin every morning.   levothyroxine  (SYNTHROID , LEVOTHROID) 75 MCG tablet Take 75 mcg by mouth daily before breakfast.    metolazone  (ZAROXOLYN ) 2.5 MG tablet Take 2.5 mg by mouth daily as needed (fluid overload). Weight over 215 lb   mometasone  (ELOCON ) 0.1 % cream Apply 1 application topically daily as needed (Rash). Qd to bid up to 5 days a week aa right lower leg prn itching   nitroGLYCERIN  (NITROSTAT ) 0.4 MG SL tablet Place 1 tablet (0.4 mg total) under the tongue every 5 (five) minutes as needed for chest pain.   NON FORMULARY Apply 1 application  topically 2 (two) times daily as needed. Azelaic acid /metronidazole /ivermectin  15%/1%/1% Apply to face 1-2 daily as needed for rosacea   ondansetron   (ZOFRAN ) 4 MG tablet Take 4 mg by mouth every 8 (eight) hours as needed for nausea or vomiting.    oxyCODONE  (ROXICODONE ) 5 MG immediate release  tablet Take 1 tablet (5 mg total) by mouth every 8 (eight) hours as needed for severe pain (pain score 7-10).   OZEMPIC, 0.25 OR 0.5 MG/DOSE, 2 MG/3ML SOPN    potassium chloride  (KLOR-CON ) 10 MEQ tablet Take 3 tablets (30 mEq total) by mouth 2 (two) times daily.   PRALUENT  150 MG/ML SOAJ INJECT 150 MG UNDER THE SKIN EVERY 14 DAYS   ranolazine  (RANEXA ) 500 MG 12 hr tablet Take 1 tablet (500 mg total) by mouth 2 (two) times daily.   ropinirole  (REQUIP ) 5 MG tablet Take 5 mg by mouth every evening.   tacrolimus  (PROTOPIC ) 0.1 % ointment Apply topically daily. Apply to aa's eczema BID PRN.   temazepam  (RESTORIL ) 30 MG capsule Take 30 mg by mouth at bedtime.   torsemide  (DEMADEX ) 20 MG tablet Take 2 tablets (40 mg total) by mouth daily. May take an additional 1-2 tablets (20 MG-40 MG) as needed for weight gain or swelling.   trolamine salicylate (ASPERCREME) 10 % cream Apply 1 application topically at bedtime.   Facility-Administered Encounter Medications as of 10/31/2023  Medication   dexamethasone  (DECADRON ) injection 10 mg    Social History: Social History   Tobacco Use   Smoking status: Never   Smokeless tobacco: Never  Vaping Use   Vaping status: Never Used  Substance Use Topics   Alcohol use: No   Drug use: No    Family Medical History: Family History  Problem Relation Age of Onset   Heart disease Mother    Breast cancer Mother 73   Heart disease Father    Heart attack Sister    Heart disease Sister    Heart disease Brother     Physical Examination: There were no vitals filed for this visit.  Awake, alert, oriented to person, place, and time.  Speech is clear and fluent. Fund of knowledge is appropriate.   Cranial Nerves: Pupils equal round and reactive to light.  Facial tone is symmetric.    No abnormal lesions on exposed  skin.   Strength: Side Iliopsoas Quads Hamstring PF DF EHL  R 4 5 5 5 5 5   L 5 5 5 5 5 5     Reflexes are 2+ and symmetric at the patella and achilles.     Clonus is not present.    Bilateral  lower extremity sensation is intact to light touch.     She has no pain with IR/ER of both hips.   Gait is slow.    Medical Decision Making  Imaging: none  Assessment and Plan: Ms. Patella a history of revision PSF/XLIF L2-L3 with Dr. Clois on 08/10/19. She also had been seeing Dr. Claudene for right sciatic neuropathy s/p right TKA that resolved.    She's had chronic intermittent back and bilateral leg pain (entire leg) to her feet that is worse with standing/walking and better with sitting since her surgery.   She is doing better, but still has constant right sided LBP with radiation to her groin. Lateral right leg pain has improved. When pain is severe, she feels weakness in her legs. Some numbness noted in right leg that is chronic.   She has known vacuum disc T11-L1 along with vacuum disc L1-L2 with disc bulging and mild right lateral recess stenosis. She is fused from L2-S1.  Treatment options discussed with patient and following plan mdae with patient:  - Orders to [facility] in [place] for aquatic therapy.  - Follow up with Dr. Avanell as scheduled for  repeat ESI.  - If no improvement with above, she likely is a candidate to extend her fusion up. She would like to avoid surgery if possible.  - Follow up with MyChart visit in 2 months.   I spent a total of 20 minutes in face-to-face and non-face-to-face activities related to this patient's care today including review of outside records, review of imaging, review of symptoms, physical exam, discussion of differential diagnosis, discussion of treatment options, and documentation.   Glade Boys PA-C Neurosurgery

## 2023-10-31 ENCOUNTER — Encounter: Payer: Self-pay | Admitting: Orthopedic Surgery

## 2023-10-31 ENCOUNTER — Ambulatory Visit (INDEPENDENT_AMBULATORY_CARE_PROVIDER_SITE_OTHER): Admitting: Orthopedic Surgery

## 2023-10-31 VITALS — BP 130/72 | Ht 62.0 in | Wt 177.0 lb

## 2023-10-31 DIAGNOSIS — Z981 Arthrodesis status: Secondary | ICD-10-CM

## 2023-10-31 DIAGNOSIS — M5416 Radiculopathy, lumbar region: Secondary | ICD-10-CM

## 2023-10-31 DIAGNOSIS — M51362 Other intervertebral disc degeneration, lumbar region with discogenic back pain and lower extremity pain: Secondary | ICD-10-CM

## 2023-10-31 DIAGNOSIS — M48061 Spinal stenosis, lumbar region without neurogenic claudication: Secondary | ICD-10-CM

## 2023-10-31 DIAGNOSIS — R29898 Other symptoms and signs involving the musculoskeletal system: Secondary | ICD-10-CM | POA: Diagnosis not present

## 2023-10-31 DIAGNOSIS — M47816 Spondylosis without myelopathy or radiculopathy, lumbar region: Secondary | ICD-10-CM

## 2023-10-31 NOTE — Patient Instructions (Signed)
 It was so nice to see you today. Thank you so much for coming in.    I sent aquatic physical therapy orders to [facility] in [place]. You can call them at [phone] if you don't hear from them to schedule your visit.   Follow up with Dr. Avanell as scheduled.   You are scheduled for a MyChart video visit with me in December.   Please do not hesitate to call if you have any questions or concerns. You can also message me in MyChart.   Glade Boys PA-C [phone]     The physicians and staff at [facility] at [facility] are committed to providing excellent care. You may receive a survey asking for feedback about your experience at our office. We value you your feedback and appreciate you taking the time to to fill it out. The [facility] leadership team is also available to discuss your experience in person, feel free to contact us  [phone].

## 2023-11-20 NOTE — Progress Notes (Signed)
 Remote PPM Transmission

## 2023-11-27 ENCOUNTER — Ambulatory Visit: Payer: Medicare Other

## 2023-11-27 DIAGNOSIS — I4729 Other ventricular tachycardia: Secondary | ICD-10-CM

## 2023-11-28 LAB — CUP PACEART REMOTE DEVICE CHECK
Date Time Interrogation Session: 20251015075735
Implantable Lead Connection Status: 753985
Implantable Lead Connection Status: 753985
Implantable Lead Implant Date: 20210120
Implantable Lead Implant Date: 20210120
Implantable Lead Location: 753859
Implantable Lead Location: 753860
Implantable Lead Model: 5076
Implantable Lead Model: 5076
Implantable Pulse Generator Implant Date: 20210120
Pulse Gen Model: 407145
Pulse Gen Serial Number: 69765687

## 2023-12-02 NOTE — Progress Notes (Signed)
 Remote PPM Transmission

## 2023-12-05 ENCOUNTER — Other Ambulatory Visit: Payer: Self-pay | Admitting: Cardiovascular Disease

## 2023-12-05 DIAGNOSIS — I25110 Atherosclerotic heart disease of native coronary artery with unstable angina pectoris: Secondary | ICD-10-CM

## 2023-12-05 DIAGNOSIS — E781 Pure hyperglyceridemia: Secondary | ICD-10-CM

## 2023-12-05 DIAGNOSIS — I2510 Atherosclerotic heart disease of native coronary artery without angina pectoris: Secondary | ICD-10-CM

## 2023-12-05 DIAGNOSIS — E785 Hyperlipidemia, unspecified: Secondary | ICD-10-CM

## 2023-12-06 NOTE — Telephone Encounter (Signed)
 Refill Request.

## 2023-12-20 ENCOUNTER — Ambulatory Visit: Payer: Self-pay | Admitting: Cardiology

## 2023-12-23 NOTE — Progress Notes (Signed)
 ANNUAL EXAM  [name] is a 78 y.o. female  CHIEF COMPLAINT: Chief Complaint  Patient presents with  . Annual Exam    SUBJECTIVE:  Patient is doing reasonably well.  Daughter died about this time last year and she is still mourning that.  On low-dose lisinopril , creatinine improved.  A1c at 6.0.  Chest pain overall controlled. ______________________________________________________________________ A comprehensive ROS was negative in all 10 systems reviewed.  ALLERGIES: Hydrocodone -acetaminophen , Cefuroxime, Cefuroxime axetil, Codeine, Hydrocodone  bitartrate, Metoclopramide , Metrogel  [metronidazole -skin cleanser], Propoxyphene, Statins-hmg-coa reductase inhibitors, Tramadol , Victoza [liraglutide], and Zetia  [ezetimibe ]  Past Medical History:  Diagnosis Date  . Adult idiopathic generalized osteoporosis 10/08/2016  . Anemia July, 2021   Two lumbar surgeries  . Anxiety disorder, unspecified 11/18/2015  . CHF (congestive heart failure) (CMS/HHS-HCC) 07/2016   diastolic CHF  . CKD (chronic kidney disease) stage 3, GFR 30-59 ml/min (CMS-HCC) 04/15/2017  . Coronary disease    50% LESIONS X3, RCA/LAD  . Diabetes mellitus type 2, uncomplicated (CMS/HHS-HCC)   . GERD (gastroesophageal reflux disease)   . Hyperlipidemia 09/08/2013  . Hypertension 10+year  . Obesity   . Pacemaker 03/04/2019   per [place] records  . RLS (restless legs syndrome)   . Sleep apnea    ON CPAP  . Surgical menopause     Past Surgical History:  Procedure Laterality Date  . CATARACT EXTRACTION  09/2014  . EGD  11/01/2014   Benign esophageal stricture/hiatus hernia/Negative biopsy/No Repeat/PYO  . Trigger thumb release Left 09/21/2015   Dr.Menz  . LAMINECTOMY LUMBAR SPINE  11/14/2015   mulit level disc fusion--- L3-S1  . other  12/02/2015   e.coli infection in spine  . picc  insertion  12/06/2015  . INCISION & DRAINAGE ABSCESS BACK  02/2016  . ORIF RADIAL HEAD / NECK FRACTURE Left 08/2016  .  FRACTURE SURGERY  08/2016  . COLONOSCOPY  11/25/2018   Tubular adenoma of the colon/Hyperplastic colon polyp CBF 11/2023  . EGD  11/25/2018   Esophageal Stenosis.Dilated No repeat per TKT  . PERCUTANEOUS SPINAL FUSION N/A 08/10/2019   Procedure: L2-3 XLIF with Posterior Fusion at L2-3;  Surgeon: Clois Reeves Norway, MD;  Location: [facility] OR;  Service: Neurosurgery;  Laterality: N/A;  . OBLIQUE LATERAL INTERBODY FUSION LUMBAR N/A 08/10/2019   Procedure: ARTHRODESIS, ANTERIOR INTERBODY TECHNIQUE, INCLUDING MINIMAL DISCECTOMY TO PREPARE INTERSPACE (OTHER THAN FOR DECOMPRESSION); LUMBAR;  Surgeon: Clois Reeves Norway, MD;  Location: [facility] OR;  Service: Neurosurgery;  Laterality: N/A;  . INSERTION INTERBODY BIOMECHANICAL DEVICE W/ INSTRUMENTATION N/A 08/10/2019   Procedure: INSERTION INTERBODY BIOMECHANICAL DEVICE WITH INTEGRAL ANTERIOR INSTRUMENTATION, TO INTERVERTEBRAL DISC SPACE IN CONJUNCTION INTERBODY ARTHRODESIS, EACH INTERSPACE;  Surgeon: Clois Reeves Norway, MD;  Location: [facility] OR;  Service: Neurosurgery;  Laterality: N/A;  . INSTRUMENTATION NON-SEGMENTAL POSTERIOR SPINE N/A 08/10/2019   Procedure: INSTRUMENTATION POSTERIOR SPINE 1/2 VERTEBRAL SEGMENTS W/O FIXATION ADDITIONAL FIFTH;  Surgeon: Clois Reeves Norway, MD;  Location: [facility] OR;  Service: Neurosurgery;  Laterality: N/A;  . INTRAOPERATIVE FLUOROSCOPY N/A 08/10/2019   Procedure: FLUOROSCOPY;  Surgeon: Clois Reeves Norway, MD;  Location: [facility] OR;  Service: Neurosurgery;  Laterality: N/A;  . INCISION & DRAINAGE POSTERIOR SPINE SACRAL/LUMBOSACRAL N/A 08/24/2019   Procedure: INCISION AND DRAINAGE, OPEN, OF DEEP ABSCESS (SUBFASCIAL),LUMBAR AREA,L2-L4 POSTERIOR SPINE WITH REMOVAL OF INSTRUMENTATION AND PLACEMENT OF NEW L2-L4 INSTRUMENTATION;  Surgeon: Clois Reeves Norway, MD;  Location: [facility] OR;  Service: Neurosurgery;  Laterality: N/A;  . FLAP MYOCUTANEOUS/FASCIOCUTANEOUS BACK Bilateral 08/24/2019   Procedure:  MUSCLE, MYOCUTANEOUS, OR FASCIOCUTANEOUS FLAP; BACK;  Surgeon: Delores Alm Barter, MD;  Location: [facility] OR;  Service: Plastic Surgery;  Laterality: Bilateral;  . DEBRIDEMENT BACK N/A 09/14/2019   Procedure: DEBRIDEMENT, BACK; MUSCLE AND/OR FASCIA (INCLUDES EPIDERMIS, DERMIS, AND SUBCUTANEOUS TISSUE, IF PERFORMED); FIRST 20 SQ CM OR LESS;  Surgeon: Delores Alm Barter, MD;  Location: [facility] OR;  Service: Plastic Surgery;  Laterality: N/A;  . Right knee arthroscopy, partial medial meniscectomy, and chondroplasty  05/09/2020   Dr Mardee  . EGD @ [facility]  07/11/2022   Gastritis/Hyperplastic gastric polyp/No repeat/TKT  . Right total knee arthroplasty using computer-assisted navigation  01/14/2023   Dr Mardee  . CARDIAC CATHETERIZATION  6-7 years ago  . CHOLECYSTECTOMY    . DILATION AND CURETTAGE, DIAGNOSTIC / THERAPEUTIC    . ENDOSCOPIC CARPAL TUNNEL RELEASE Bilateral   . HYSTERECTOMY  A about age 55  . INSERT / REPLACE / REMOVE PACEMAKER  January, 2021   [employer]  . KNEE ARTHROSCOPY  05-09-20  . OTHER SURGERY     Pacemaker implant March 04 2019 Dr. Elspeth Sage  . OTHER SURGERY     Back surgery fusion L2-L3  . REPAIR ROTATOR CUFF TEAR ACUTE OPEN Right   . Right Trigger Thumb Release    . TAH/BSO    . TONSILLECTOMY    . WISDOM TEETH EXTRACTION      Current Outpatient Medications  Medication Sig Dispense Refill  . acetaminophen  (TYLENOL ) 650 MG ER tablet Take 1,300 mg by mouth every 6 (six) hours as needed       . albuterol  90 mcg/actuation inhaler Inhale 2 inhalations into the lungs every 6 (six) hours as needed 1 each 1  . alirocumab  (PRALUENT  PEN) 150 mg/mL PnIj Inject 1 pen! subcutaneously every 14 (fourteen) days    . aspirin  81 MG EC tablet Take 1 tablet (81 mg total) by mouth at bedtime BID per patient.    . atenoloL  (TENORMIN ) 25 MG tablet take 1 tablet by mouth twice a day 180 tablet 2  . BD NANO 2ND GEN PEN NEEDLE 32 gauge x 5/32 Ndle USE 1 PEN NEEDLE FOUR TIMES A DAY 400  each 3  . blood-glucose sensor (FREESTYLE LIBRE 3 PLUS SENSOR) Devi Use 1 each every 15 (fifteen) days 6 each 4  . budesonide -formoteroL  (SYMBICORT ) 160-4.5 mcg/actuation inhaler Inhale 2 inhalations into the lungs 2 (two) times daily    . cholecalciferol  (VITAMIN D3) 2,000 unit tablet Take 1,000 Units by mouth once daily    . desvenlafaxine succinate (PRISTIQ) 25 mg ER tablet Take 25 mg by mouth once daily    . doxycycline  (PERIOSTAT ) 20 MG tablet Take 20 mg by mouth 2 (two) times daily    . dupilumab  (DUPIXENT  PEN) 300 mg/2 mL pen injector Inject 300 mg subcutaneously HAS NOT YET STARTED.    SABRA esomeprazole (NEXIUM) 40 MG DR capsule Take 1 capsule (40 mg total) by mouth 2 (two) times daily 180 capsule 3  . etodolac  (LODINE ) 400 MG tablet Take 1 tablet (400 mg total) by mouth 2 (two) times daily with meals 180 tablet 3  . EUCRISA  2 % Oint Apply 1 Application  topically once daily as needed Apply 1 Application topically as directed. qd to bid to aa eczema on back as needed for flares    . flash glucose scanning (FREESTYLE LIBRE 2 READER) reader Use 1 Device as directed 1 each 1  . FREESTYLE LIBRE 2 SENSOR kit Inject 6 kits subcutaneously every 14 (fourteen) days for glucose monitoring.  LIBRE 2  not the libre 14 day. 6 kit 4  . gabapentin  (NEURONTIN ) 300 MG capsule TAKE 1 CAPSULE BY MOUTH THREE TIMES A DAY 270 capsule 3  . insulin  ASPART (NOVOLOG  FLEXPEN) pen injector (concentration 100 units/mL) Use as needed based on carb intake and blood sugar.  Max daily dose 50 units 45 mL 3  . levothyroxine  (SYNTHROID ) 75 MCG tablet Take 1 tablet (75 mcg total) by mouth every morning before breakfast (0630) ON AN EMPTY STOMACH WITH A GLASS OF WATER AT LEAST 30 TO 60 MINUTES BEFORE BREAKFAST 90 tablet 3  . methocarbamoL  (ROBAXIN ) 500 MG tablet Take 500 mg by mouth 3 (three) times daily as needed    . nitroGLYcerin  (NITROSTAT ) 0.4 MG SL tablet Place 0.4 mg under the tongue every 5 (five) minutes as needed for Chest  pain May take up to 3 doses.    SABRA OZEMPIC 0.25 mg or 0.5 mg (2 mg/3 mL) pen injector INJECT 0.5 MG UNDER THE SKIN ONCE A WEEK 9 mL 3  . potassium chloride  (KLOR-CON ) 10 MEQ ER tablet Take 2 tablets (20 mEq total) by mouth 3 (three) times daily    . ranolazine  (RANEXA ) 500 MG ER tablet Take 1 tablet (500 mg total) by mouth 2 (two) times daily 180 tablet 3  . rOPINIRole  (REQUIP ) 5 MG immediate release tablet TAKE 1 TABLET BY MOUTH EVERYDAY AT BEDTIME 90 tablet 3  . tacrolimus  (PROTOPIC ) 0.1 % ointment Apply topically 2 (two) times daily    . temazepam  (RESTORIL ) 30 mg capsule Take 1 capsule (30 mg total) by mouth at bedtime 90 capsule 1  . TORsemide  (DEMADEX ) 20 MG tablet Take 2 tablets (40 mg total) by mouth once daily 1 or 2 daily for fluid    . trolamine salicylate (ASPERCREME) 10 % cream Apply topically 2 (two) times daily       . lisinopriL  (ZESTRIL ) 10 MG tablet Take 1 tablet (10 mg total) by mouth once daily    . metOLazone  (ZAROXOLYN ) 2.5 MG tablet Take 1 tablet (2.5 mg total) by mouth once daily as needed (Fluid) 30 tablet 0   No current facility-administered medications for this visit.    PHYSICAL EXAM: BP 110/60   Pulse 80   Wt 82.4 kg (181 lb 9.6 oz)   SpO2 97%   BMI 33.20 kg/m  Body mass index is 33.2 kg/m.  Wt Readings from Last 3 Encounters:  12/23/23 82.4 kg (181 lb 9.6 oz)  12/03/23 78.9 kg (174 lb)  11/20/23 81.2 kg (179 lb)     BP Readings from Last 3 Encounters:  12/23/23 110/60  12/03/23 120/70  11/20/23 (!) 140/70    General: Alert oriented x3  Skin: No suspicious lesions or moles.   Eyes: Sclera and conjunctiva clear; pupils equal round and reactive to light and accommodation; extraocular movements intact Ears: External ears and canal normal; tympanic membranes normal.   Nose: Mucosa healthy without drainage or ulceration Oropharynx: No suspicious lesions Neck: No swelling, masses, stiffness, pain, limited movement, carotid pulses normal bilaterally,  thyroid normal size, no masses palpated. No bruits heard. Lungs: Respirations unlabored; clear to auscultation bilaterally Back: No spinal deformity Cardiovascular: Heart regular rate and rhythm without murmurs, gallops, or rubs Abdomen: Soft; non tender; non distended;  no masses or organomegaly Lymph Nodes: No significant cervical, supraclavicular, or axillary lymphadenopathy noted Musculoskeletal: No active joint inflammation Extremities: Normal, no edema Pulses: Dorsalis pedis palpable and symmetric bilaterally Neurologic: Alert and oriented times 3; speech intact; face symmetrical; moves  all extremities well    ASSESSMENT/PLAN  Type 2 diabetes-on Ozempic alone, A1c 6.0, no lows CKD 3A-creatinine down to 1.2 on low-dose lisinopril , potassium is good  Diastolic CHF-well-controlled on diuretics OSA-compliant with CPAP, sees benefit 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  COMPLETED: Lose Weight        Patient want to get to 205 pounds. Patients weight at our office this afternoon is 209 pounds. Her scales at home says 207 pounds first thing in the morning.     .  COMPLETED: Lose Weight        Continue to lose weight and reduce sodium.  Weight to 190 lbs.    . * Reduce sodium intake (pt-stated)   On track     Patient will try limiting her salt intake over the next year.          .phq2  Return in about 6 months (around 06/21/2024) for followup.              *Some images could not be shown.

## 2023-12-23 NOTE — Progress Notes (Signed)
 Medicare Wellness Visit   Providers Rendering Care Dr. Oneil Miller-Internal Medicine  Functional Assessment (1) Hearing: Demonstrates no difficulty in hearing during normal conversation (2) Risk of Falls: Patient denies any falls or near falls in the last year (3) Home Safety: Patient feels secure in their home. There are operational smoke alarms in multiple areas of the home. (4) Activities of Daily Living: Independently manages personal grooming and household chores, including cooking, cleaning and laundry.  Manages Personal finances without assistance.    Depression Screening PHQ 2/9 last 3 flowsheet values     12/01/2021   11:37 AM 12/14/2022   11:28 AM 12/23/2023   12:48 PM  PHQ-2/9 Depression Screening   Little interest or pleasure in doing things  0 1  Feeling down, depressed, or hopeless  0 1  Patient Health Questionnaire-2 Score  0 * 2 *  How difficult have these problems made it for you to do your work, take care of things at home, or get along with other people?   Not difficult at all  (OBSOLETE) Little interest or pleasure in doing things 0    (OBSOLETE) Feeling down, depressed, or hopeless (or irritable for Teens only)? 0    (OBSOLETE) Total Prescreening Score 0    (OBSOLETE) Total Score = 0      * Patient-reported  * Data saved with a previous flowsheet row definition     Depression Severity and Treatment Recommendations:  0-4= None  5-9= Mild / Treatment: Support, educate to call if worse; return in one month  10-14= Moderate / Treatment: Support, watchful waiting; Antidepressant or Psychotherapy  15-19= Moderately severe / Treatment: Antidepressant OR Psychotherapy  >= 20 = Major depression, severe / Antidepressant AND Psychotherapy   Cognitive impairment Oriented to person, place and time.  Responses appear appropriate and timely to this observer.   Prevention Plan  Item name                              Frequency        Month Due       Year  Due Health Maintenance  Topic Date Due  . Parathyroid Hormone  Never done  . Diabetes Education  Never done  . Hepatitis C Screen  Never done  . Shingrix (1 of 2) Never done  . RSV Immunization Pregnant or 50+ (1 - 1-dose 75+ series) Never done  . Serum Phosphorus  08/28/2020  . Diabetes Eye Assessment Exam  12/03/2023  . Colorectal Cancer Screening  11/25/2023  . Mammogram  04/11/2024  . COVID-19 Vaccine (7 - Moderna risk 2025-26 season) 05/03/2024  . Hemoglobin A1C  06/02/2024  . DXA Bone Density Scan  06/22/2024  . Monofilament Foot Exam  07/01/2024  . Creatinine Level  12/02/2024  . Potassium Level  12/02/2024  . TSH Level  12/02/2024  . Lipid Panel  12/02/2024  . Serum Bicarbonate  12/02/2024  . Serum Calcium   12/02/2024  . Annual Urine Albumin Creatinine Ratio  12/11/2024  . Depression Screening  12/22/2024  . Medicare Subsequent AWV [id number]  12/23/2024  . Adult Tetanus (Td And Tdap)  04/23/2028  . Medicare Initial AWV [id number]  Completed  . Pneumococcal Vaccine: 50+  Completed  . Influenza Vaccine  Completed  . Hib Vaccines  Aged Out  . Hepatitis A Vaccines  Aged Out  . Meningococcal B Vaccine  Aged Out  . Meningococcal ACWY Vaccine  Aged Out  .  HPV Vaccines  Aged Out    Other personalized health advice Encouraged patient to exercise regularly.  Encouraged attention to diet with good intake of fruits, vegetables, and limitation of red meat to 2 times a week or less    End of Life Counseling Patient has a living will in place.  Patient is a full code.     *Some images could not be shown.

## 2023-12-23 NOTE — Progress Notes (Signed)
 Note:

## 2023-12-25 NOTE — Progress Notes (Signed)
 [facility] - Physiatry  PROCEDURE: 1. A Right L1-2 transforaminal epidural injection under fluoroscopic guidance. 2. A Right L4-5 transforaminal epidural injection under fluoroscopic guidance. 3. Use of fluoroscopic guidance was required to ensure adequate delivery of medication into the epidural space and for proper needle placement. 4. The patient was monitored with continuous pulse oximetry throughout the procedure.   DIAGNOSIS/INDICATION FOR PROCEDURE: The patient is a pleasant 78 year old female whom I have followed for acute on chronic low back pain with radiation into the right buttock, groin, and a new onset of burning in the anterior and medial aspect of the lower leg.  Clinically she has some symptoms that may be consistent with a right-sided L4 radicular lightest as well as the disc protrusion at L1-2 (above her fusion).  On 01/14/2023 she underwent right total knee arthroplasty.  Immediately after the procedure she reported worsening of right foot numbness and increased right leg radicular pain.  This was further exacerbated with physical therapy.  In addition when she is at her last clinic appointment she described pain radiating into the left lateral calf consistent with a left L5 radiculitis.  Upon presentation to the fluoroscopy suite today she indicates that her left lower extremity pain has improved but she now has pain radiating into the right anterior lateral lower leg.  She is now fused from L2-S1.  Most recent surgery was L2-3 XLIF on 08/10/2019 by Dr. Clois.  She has a pacemaker.  On 05/03/2023 EMG was obtained by Dr. Tonita Blanch: History: This is a 78 year old female referred for right foot weakness and numbness following total knee arthroscopy.  NCV & EMG Findings: Extensive electrodiagnostic testing of the right lower extremity and additional studies of the left shows:  Bilateral sural and left superficial peroneal sensory  responses are within normal limits. Right superficial peroneal sensory response is absent. Bilateral peroneal and left tibial motor responses are within normal limits. Right tibial motor response is absent. Bilateral tibial H reflex studies are within normal limits. Reduced motor unit recruitment is seen in the right tibialis anterior, gastrocnemius, and biceps femoris short head muscles, without accompanied active denervation. Despite maximal activation, no motor unit recruitment is seen in the right flexor digitorum longus muscle. These findings are not present in the left lower extremity.  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    CT myelogram dated 09/04/2023 was reviewed: FINDINGS:  Lumbar puncture for intrathecal contrast administration and  acquisition of conventional myelogram images were performed by  Delon Beagle NP under the supervision of Dr. Jennefer and  reported separately.  Segmentation:  5 lumbar type vertebral bodies.  Alignment: Unchanged fused 7 mm retrolisthesis of L2 on L3. New  trace retrolisthesis of L1 on L2.  Vertebrae: No acute fracture or suspicious lesion. Previous  posterior and interbody fusion from L2-S1 with solid interbody  arthrodesis at each level. Pedicle screws remain in place  bilaterally from L2-L4 without evidence of loosening. An old pedicle  screw tracks are again noted at L5 and S1. Solid posterolateral  osseous fusion is present on the left from L2-S1 and on the right at  L5-S1.  Conus medullaris: Extends to the L2 level and appears normal.  Paraspinal and other soft tissues: Aortic atherosclerosis without  aneurysm. Postoperative changes in the posterior lumbar soft  tissues. Punctate nonobstructing bilateral renal calculi.   Disc levels:  T11-12: Mild disc space narrowing and vacuum disc  phenomenon. Mild  disc bulging eccentric to the right without significant stenosis,  unchanged.   T12-L1: Moderate disc space narrowing with vacuum disc phenomenon  and degenerative endplate sclerosis. Disc bulging eccentric to the  left without significant stenosis, unchanged.   L1-2: New mild disc space narrowing with vacuum disc phenomenon and  degenerative endplate changes including a new prominent Schmorl's  node involving the L1 inferior endplate on the right. New disc  bulging eccentric to the right and moderate facet and ligamentum  flavum hypertrophy result in mild right lateral recess stenosis  without significant spinal or neural foraminal stenosis.   L2-3: Prior fusion.  No significant stenosis.   L3-4: Prior posterior decompression and fusion. No significant  stenosis.   L4-5: Prior posterior decompression and fusion. No significant  stenosis.   L5-S1: Prior posterior decompression and fusion with widely patent  spinal canal. Unchanged mild osseous neural foraminal narrowing  bilaterally.   IMPRESSION:  1. Solid L2-S1 fusion without significant residual stenosis.  2. New adjacent segment disease at L1-2 with mild right lateral  recess stenosis.  3.  Aortic Atherosclerosis (ICD10-I70.0).   Electronically Signed    By: Dasie Hamburg M.D.    On: 09/11/2023 16:06   Her pain is rated 8/10.    She has diabetes mellitus. Her hemoglobin A1c on 01/03/2024 was 6.0.  Procedures: 12/25/2023: Right L1-2 and right L4-5 transforaminal ESI 09/17/2023: Right L1-2 and right L4-5 transforaminal ESI (60% relief) 06/27/2023: Right L1-2 and right L4-5 transforaminal ESI (60% relief) 03/14/2023: Right L1-2 and right L4-5 transforaminal ESI (70% relief)  12/05/2022: Right L1-2 and right L4-5 transforaminal ESI (60% relief, [place] location)  06/29/2022: Right L1-2 and right L4-5 transforaminal ESI (over 70% relief) 02/20/2022: Right L1-2 and right L4-5 transforaminal ESI (over 70%  relief)  11/14/2021: Right L1-2 and right L4-5 transforaminal ESI (initially 70% relief then sustained greater than 50% relief) 07/20/2021: Right L1-2 and right L4-5 transforaminal ESI (70% relief) 04/18/2021: Right L1-2 and right L5-S1 transforaminal ESI (70% relief) 01/17/2021: Right L1-2 transforaminal ESI (over 60% relief) 11/30/2020: Right L1-2 transforaminal ESI  (25% relief) 08/01/2020: Right sacrococcygeal injection (good relief of tailbone pain) 08/10/2019: L2-3 XLIF (Dr. Clois) 04/23/2019: Right L2-3 transforaminal ESI (relief of upper back pain, no relief of L3 radicular symptoms; right L3-4 transforaminal approach was aborted due to tachycardia, lightheadedness, O2 sat of 89%) 03/24/2019: Right sacroiliac joint injection under ultrasound 01/20/2019: Right L2-3 and right L3-4 transforaminal ESI (moderate to good relief x6 weeks) 12/05/2018: Right L2-3 and right L3-4 transforaminal ESI (moderate relief)   IMPRESSION/RESULTS: Patient tolerated the procedure well without any complications. We used a 5 inch needle at the L4-5 foramen and a 3.5 inch needle at the L1-2 foramen.  We also used 1% lidocaine .  Immediately after the procedure she rated her pain 4/10.  The procedure was completed without difficulty although after the procedure when she went to stand she describes some increased right buttock and groin pressure that improved with sitting.  She previously has had difficulty getting on and off of the table due to severe spasms.  She took Valium  5 mg 1 tablet prior to the procedure and did well with this.  I would recommend this for future procedures.  We placed a pillow under her chest and pelvis.  She will follow-up with me in 3-4 weeks.    INFORMED CONSENT: The patient understood the potential risks and benefits of the procedure, which were  explained to the patient prior to the procedure.  The patient read and signed the consent stating complete understanding of the information,  and wished to proceed with the procedure, there were no barriers to understanding.  Ample time was given for any questions to be answered prior to the procedure. The risks of the procedure were explained including, but not limited to the risk of bleeding and/or infection into the spinal area, intervertebral discs, or epidural space; nerve injury; nerve irritation; reaction to medications; etc.  The patient denied any history of bleeding disorders, allergies to medications used or other medical contraindications to the procedure.  No promises were given to any expected outcome.     PROCEDURE: A time out was performed by physician and assistant.  Correct patient, procedure, plan of care, site, position, and equipment were verified.  Identical procedural technique was used for both epidural injections.  The patient was sterilely prepped with a triple scrub of betadine  solution and draped in the prone position.  Careful attention was paid to aseptic technique throughout the procedure.  Then, the Right L4-5 neuroforamen was identified under fluoroscopic guidance.  The overlying skin and subcutaneous tissues were anesthetized with approximately 2 cc of 1% Xylocaine .  A 25 gauge spinal needle was inserted down into the foramen.  Approximately 2 cc of Omnipaque -240 mgI/mL contrast was infiltrated under real time-fluoroscopy demonstrating satisfactory spread along the exiting spinal nerve and into the epidural space without vascular uptake.  No aspirate was noted.  Spot films were taken.  A solution containing 0.4 cc of dexamethasone  sodium phosphate  10 mg/cc and 0.3 cc of 2% preservative-free lidocaine  was slowly infiltrated around the spinal nerve and into the epidural space under real-time fluoroscopy.  There was good contrast washout.  The needle was removed.   Then, the Right L1-2 neuroforamen was identified under fluoroscopic guidance.  The overlying skin and subcutaneous tissues were anesthetized with  approximately 2 cc of 1% Xylocaine .  A 25 gauge spinal needle was inserted down into the foramen.  Approximately 2 cc of Omnipaque -240 mgI/mL contrast was infiltrated under real time-fluoroscopy demonstrating satisfactory spread along the exiting spinal nerve and into the epidural space without vascular uptake.  No aspirate was noted.  Spot films were taken.  A solution containing 0.6 cc of dexamethasone  sodium phosphate  10 mg/cc and 0.3 cc of 2% preservative-free lidocaine  was slowly infiltrated around the spinal nerve and into the epidural space under real-time fluoroscopy.  There was good contrast washout.  The needle was removed.  There were no complications.   DISCHARGE SUMMARY: The patient was monitored post-injection for 30 minutes in the recovery area and remained stable without evidence of complications.  The patient was discharged with discharge instructions in stable condition. Patient was instructed to contact us  with any problems.  Procedure fluoro time: 32 seconds. Fluoroscopy dose: 17.34 mGy.  This is below the dose threshold #1 (5000 mGy).    BENJAMIN CHARLES CHASNIS, DO

## 2023-12-27 ENCOUNTER — Encounter: Payer: Self-pay | Admitting: Cardiology

## 2023-12-27 ENCOUNTER — Ambulatory Visit: Attending: Cardiology | Admitting: Cardiology

## 2023-12-27 VITALS — BP 124/58 | HR 72 | Ht 62.0 in | Wt 182.6 lb

## 2023-12-27 DIAGNOSIS — I5032 Chronic diastolic (congestive) heart failure: Secondary | ICD-10-CM | POA: Insufficient documentation

## 2023-12-27 DIAGNOSIS — G4733 Obstructive sleep apnea (adult) (pediatric): Secondary | ICD-10-CM | POA: Insufficient documentation

## 2023-12-27 DIAGNOSIS — I25110 Atherosclerotic heart disease of native coronary artery with unstable angina pectoris: Secondary | ICD-10-CM | POA: Insufficient documentation

## 2023-12-27 DIAGNOSIS — E785 Hyperlipidemia, unspecified: Secondary | ICD-10-CM | POA: Diagnosis present

## 2023-12-27 DIAGNOSIS — D649 Anemia, unspecified: Secondary | ICD-10-CM | POA: Diagnosis present

## 2023-12-27 DIAGNOSIS — R42 Dizziness and giddiness: Secondary | ICD-10-CM | POA: Diagnosis present

## 2023-12-27 DIAGNOSIS — Z794 Long term (current) use of insulin: Secondary | ICD-10-CM | POA: Diagnosis present

## 2023-12-27 DIAGNOSIS — I4719 Other supraventricular tachycardia: Secondary | ICD-10-CM | POA: Insufficient documentation

## 2023-12-27 DIAGNOSIS — I10 Essential (primary) hypertension: Secondary | ICD-10-CM | POA: Insufficient documentation

## 2023-12-27 DIAGNOSIS — E119 Type 2 diabetes mellitus without complications: Secondary | ICD-10-CM | POA: Insufficient documentation

## 2023-12-27 MED ORDER — RANOLAZINE ER 1000 MG PO TB12: 1000.0000 mg | ORAL_TABLET | Freq: Two times a day (BID) | ORAL | 3 refills | Status: DC

## 2023-12-27 MED ORDER — RANOLAZINE ER 500 MG PO TB12: 1000.0000 mg | ORAL_TABLET | Freq: Two times a day (BID) | ORAL | 3 refills | Status: DC

## 2023-12-27 MED ORDER — POTASSIUM CHLORIDE ER 10 MEQ PO TBCR: 30.0000 meq | EXTENDED_RELEASE_TABLET | Freq: Two times a day (BID) | ORAL | 3 refills | Status: AC

## 2023-12-27 NOTE — Patient Instructions (Signed)
 Medication Instructions:  Your physician recommends the following medication changes.  INCREASE: Renexa to 1000 mg two times daily  *If you need a refill on your cardiac medications before your next appointment, please call your pharmacy*  Lab Work: No labs ordered today  If you have labs (blood work) drawn today and your tests are completely normal, you will receive your results only by: MyChart Message (if you have MyChart) OR A paper copy in the mail If you have any lab test that is abnormal or we need to change your treatment, we will call you to review the results.  Testing/Procedures: No test ordered today   Follow-Up: At [facility], you and your health needs are our priority.  As part of our continuing mission to provide you with exceptional heart care, our providers are all part of one team.  This team includes your primary Cardiologist (physician) and Advanced Practice Providers or APPs (Physician Assistants and Nurse Practitioners) who all work together to provide you with the care you need, when you need it.  Your next appointment:   3 month(s)  Provider:   You may see Timothy Gollan, MD or one of the following Advanced Practice Providers on your designated Care Team:   Tylene Lunch, NP

## 2023-12-27 NOTE — Progress Notes (Signed)
 Cardiology Office Note   Date:  12/27/2023  ID:  [name], DOB 22-Feb-1945, MRN [id number] PCP: Cleotilde Oneil FALCON, MD  [facility] Providers Cardiologist:  Evalene Lunger, MD Cardiology APP:  Gerard Frederick, NP  Electrophysiologist:  Elspeth Sage, MD (Inactive)  Electrophysiology APP:  Riddle, Suzann, NP     History of Present Illness [name] is a 78 y.o. 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   Previously done, remote catheterization 07/2011 which showed left dominant system with moderate mid LAD and proximal D1, proximal RCA disease estimated 50% stenosis with normal LV systolic function.  She was evaluated for chest pain in 2018 with and underwent MPI which was considered low risk scan.  Coronary CTA was equivocal and concerning for LAD disease.  Subsequent left heart cath showed 80% ostial diagonal stenosis to 50% proximal to mid LAD stenosis.  Medical management was recommended.  In 2021 she underwent permanent pacemaker [place]) syncope with loop monitor showing prolonged bradycardia with pauses of up to 13 seconds.  In 08/2020 she was admitted with chest pain and ruled out.  CT of the chest was negative for PE.  Echocardiogram showed EF 60 to 65%, no RWMA, G2 DD, and no significant valvular abnormalities.  Lexiscan  MPI at that time showed no significant ischemia with an EF of 67%.  Overall was low risk study.  08/04/2021 she was seen in clinic noting a 2-week history of constant substernal chest pain that was exacerbated with ambulation associated with diaphoresis, dyspnea, nausea and flushing.  EKG showed sinus rhythm with nonspecific anterior ST/T wave changes.  She continued to  have ongoing chest pain episode to the emergency department for further evaluation.  High-sensitivity troponin of 3 with delta troponin of less than 2.  She declined admission.  Imdur  was titrated to 30 mg daily.  Subsequent coronary CTA in 08/10/2021 showed calcium  score of 897 which was 92nd percentile for age and sex matched control.  Moderate calcified plaque in the proximal LAD and D1, estimated at 50 to 69% stenosis as well as mild proximal RCA and mid left circumflex calcified stenosis with CT FFR being negative.  She was evaluated by Dr. Gollan in clinic 09/25/2021 with complaints of lower extremity edema, tachycardia, chest pain, shortness of breath.  She was restarted on low-dose Imdur  15 mg daily and atenolol  12.5 mg daily.  She was again evaluated in the [facility] department 12/24/2021 for shortness of breath.  She reported over the last month that she did shortness of breath seem to be worsening.  She continued to have chest pain.  Echocardiogram revealed an LVEF of 55 to 60%, no RWMA, mild LVH, G2 DD, mild mitral valve regurgitation.  Right and left heart catheterization completed on 12/26/2021 revealed stable coronary arteries with severe stenosis involving the proximal portion of the small D1 branch and otherwise mild to moderate nonobstructive disease, mildly elevated left filling pressures, moderately to severely elevated right heart filling pressures.  She was continued with medical therapy as the T1 was not well-suited for PCI.  She was diuresed and had SGLT2 inhibitor added to her medication regimen.  She was evaluated in clinic 12/07/2022.  At this time she is shortness of breath with exertion.  She previously been  admitted to [facility] in September with COVID.  She has noted to have a large burden of PVCs at that time.  She was considered acceptable risk for general knee replacement no further cardiac testing.  There were no medication changes that were made.  She was seen in clinic  08/23/2023 with varying symptoms of atypical chest pain and jaw pain.  She has taken nitro given relief approximately 4 separate times and continues to have some complaints of numbness in her feet status post knee replacement.  She has been working on weight loss.  She was scheduled for a cardiac PET stress to evaluate her discomfort and jaw pain relieved with nitro.  Cardiac PET showed LV perfusion is normal with no evidence of ischemia no evidence of infarct.  She was evaluated by EP 08/29/2023.  At that time she did not had any further discomfort her device was interrogated with no changes made.  She was continued on her current medication regimen with no changes.  She was last seen in clinic 10/09/2023 with concerns of elevated blood pressure, headaches and blood pressure, continued shortness of breath, fatigue.  She states she continue to follow-up with pulmonary for OSA.  She continues to note weight loss.  She been compliant with her current medication regimen.  There were no medication changes that were needed or further testing that was ordered.   She returns to clinic today stating that she has been up and down.  She has had an increased amount of dizziness as of late not like vertigo.  She states that she is continue to have some chest discomfort and is taking Nitrostat  2-3 times since her last visit.  She also noted that a couple of times she had some possible near syncopal episodes and she had noticed that her blood pressure was.  She had some diaphoresis nausea and vomiting.  States that she has been compliant with her current medication regimen.  Denies any recent hospitalizations or visits to the emergency department.  ROS: 10 Point review of systems is reviewed and considered negative exception was been listed in the HPI  Studies Reviewed EKG Interpretation Date/Time:  Friday December 27 2023 10:08:20 EST Ventricular Rate:  72 PR Interval:  142 QRS Duration:  76 QT Interval:  378 QTC  Calculation: 413 R Axis:   9  Text Interpretation: Normal sinus rhythm Normal ECG When compared with ECG of 09-Oct-2023 09:58, No significant change was found Confirmed by Gerard Frederick ([id number]) on 12/27/2023 10:12:01 AM    Cardiac PET stress 09/19/2023    LV perfusion is normal. There is no evidence of ischemia. There is no evidence of infarction.   Rest left ventricular function is normal. Rest EF: 71%. Stress left ventricular function is normal. Stress EF: 75%. End diastolic cavity size is normal. End systolic cavity size is normal.   Myocardial blood flow was computed to be 0.40ml/g/min at rest and 2.80ml/g/min at stress. Global myocardial blood flow reserve was 2.64 and was normal.   Coronary calcium  was present on the attenuation correction CT images. Moderate coronary calcifications were present. Coronary calcifications were present in the left anterior descending artery and left circumflex artery distribution(s).   The study is normal. The study is low risk.   [facility] 12/26/2021 Conclusions: Stable appearance of coronary arteries with severe stenosis involving proximal portion of small D1 branch and otherwise mild-moderate, non-obstructive disease. Mildly elevated left heart filling pressures. Moderately-severely elevated right heart filling pressures. Normal Fick cardiac output/index.   Recommendations:  Continue medical therapy of CAD.  D1 is not well-suited for PCI. Consider gentle diuresis and addition of SGLT-2 inhibitor if renal function remains stable/improved.   2D echo 12/26/2021  1. Left ventricular ejection fraction, by estimation, is 55 to 60%. The  left ventricle has normal function. The left ventricle has no regional  wall motion abnormalities. There is mild left ventricular hypertrophy.  Left ventricular diastolic parameters  are consistent with Grade II diastolic dysfunction (pseudonormalization).  Elevated left atrial pressure.   2. Right ventricular systolic  function is normal. The right ventricular  size is normal.   3. The mitral valve is degenerative. Mild mitral valve regurgitation. No  evidence of mitral stenosis.   4. The aortic valve is tricuspid. There is mild calcification of the  aortic valve. There is mild thickening of the aortic valve. Aortic valve  regurgitation is not visualized. Aortic valve sclerosis/calcification is  present, without any evidence of  aortic stenosis.   Risk Assessment/Calculations         Physical Exam VS:  BP (!) 124/58 (BP Location: Right Arm, Patient Position: Sitting, Cuff Size: Normal)   Pulse 72 Comment: 70 oximeter  Ht 5' 2 (1.575 m)   Wt 182 lb 9.6 oz (82.8 kg)   SpO2 99%   BMI 33.40 kg/m        Wt Readings from Last 3 Encounters:  12/27/23 182 lb 9.6 oz (82.8 kg)  10/31/23 177 lb (80.3 kg)  10/10/23 188 lb 6.4 oz (85.5 kg)    GEN: Well nourished, well developed in no acute distress NECK: No JVD; No carotid bruits CARDIAC: RRR, no murmurs, rubs, gallops RESPIRATORY:  Clear to auscultation without rales, wheezing or rhonchi  ABDOMEN: Soft, non-tender, non-distended EXTREMITIES:   Trace pretibial edema; No deformity   ASSESSMENT AND PLAN Coronary artery disease of native coronary arteries with stable angina.  Previous cardiac PET stress completed in August revealed no evidence of ischemia and normal myocardial blood flow.  She continues to take nitro 2-3 times over 3 to 4 month period.  Will increase her Ranexa  to 1000 mg twice daily.  She has been continued on aspirin  81 mg daily Praluent  injection 150 mg every 2 weeks.  EKG today reveals sinus rhythm with a rate of 72 with no ischemic changes noted.  No further ischemic evaluation needed at this time.  Chronic HFpEF/shortness of breath with last echocardiogram revealed LVEF 55 to 60%, no RWMA, LVH that was mild, G2 DD, and mild MR.  Shortness of breath has been ongoing.  She appears to be euvolemic on exam today.  She is continued on  torsemide  and Jardiance .  She is not placed on MRA therapy due to previously elevated serum creatinine.  Hypertension with a blood pressure today of 120/58 that has been well-controlled.  She has been encouraged to continue to monitor pressures 1 to 2 hours postmedication administration at home as well.  She has been continued on atenolol  25 mg twice daily and torsemide  20 mg daily.  Hyperlipidemia with last LDL 63 which remains at goal.  She has a longstanding history of being statin intolerant.  She has continued on Praluent  injections every 2 weeks.  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  She continues to be followed by EP.  Last interrogation showed no changes noted on histograms.  Type 2 diabetes with last A1c of 7.3.  She is continued on Jardiance  and Ozempic.  Ongoing management per PCP.  Obstructive sleep  apnea continues to be compliant with CPAP.  Lightheadedness and dizziness.  She states she has not previously had carotid duplex completed at [facility] which we are requesting those report.  She states that she has minimal carotid disease and was advised she would only need repeat studies every 5 years.  Anemia that she states has been chronic with last hemoglobin of 10.6.  It was likely contributing to some of her shortness of breath.  Ongoing management per PCP.       Dispo: Patient to return to clinic to see MD/APP in 3 months or sooner if needed for reevaluation  Signed, Duana Benedict, NP

## 2023-12-30 ENCOUNTER — Other Ambulatory Visit: Payer: Self-pay

## 2023-12-30 NOTE — Telephone Encounter (Signed)
 [facility] mail order pharmacy requesting clarification for medication ranolazine . Pt should be taking ranolazine  1,000 mg BID. Pt was prescribed Ranolazine  500 mg taking 2 tablets BID. Can pt please be prescribed least tablets to take. Please address   Inv# [id number]  Ph# [phone]

## 2023-12-31 ENCOUNTER — Telehealth: Payer: Self-pay

## 2023-12-31 NOTE — Telephone Encounter (Signed)
 Patient called let us  know she is going to stop Dupixent  injections for a while because the last time she took it she felt sick, flu like symptoms, she report she take a lot of different medications and her symptoms could be coming from something other than Dupixent  but she will hold off from Dupixent  now.

## 2023-12-31 NOTE — Telephone Encounter (Signed)
 Called patient and discussed   Advise pt to stay off a couple months because it may take a month to get out of her system. Let us  know how things go

## 2024-01-01 NOTE — Telephone Encounter (Signed)
 Called pharmacy to clarify that pt is to take ranolazine  500 mg 2 tablets BID. Pharmacy verbalized understanding. Confirmation received.

## 2024-01-13 ENCOUNTER — Ambulatory Visit: Admitting: Cardiology

## 2024-01-17 NOTE — Progress Notes (Signed)
 Referring Physician:  Cleotilde Oneil FALCON, MD [address] [facility] [place],  [place] [address]  Primary Physician:  Cleotilde Oneil FALCON, MD  History of Present Illness: Ms. Malayzia Emmitt has a history of osteoporosis, CHF, CKD stage 3, CAD, DM, GERD, HTN, obesity. TLS, OSA with CPAP.   She has a pacemaker.   She has history of revision PSF/XLIF L2-L3 with Dr. Clois on 08/10/19.   Last seen by Dr. Claudene on 07/15/23 for right sciatic neuropathy s/p right TKA. No compression seen on EMG. He did not recommend further surgery.   Last seen by me on 10/31/23 for right sided LBP with radiation to her groin. She has known vacuum disc T11-L1 along with vacuum disc L1-L2 with disc bulging and mild right lateral recess stenosis. She is fused from L2-S1.   She was sent to aquatic PT and advised to follow up with Dr. Avanell for further injections.   She had right L1-L2 TF ESI and right L4-L5 TF ESI with Dr. Avanell on 12/25/23. She had improvement with this injection. She is scheduled for a repeat injection on 04/01/23.   She has been doing water aerobics at [employer]- pool is closed for maintenance. She is doing her HEP from PT in the water.   She is here for follow up.   She has about 80% improvement in her pain after injection for about a month. Pain is slowly returning.   She has intermittent right sided LBP with radiation to her groin. Pain is worse with standing, walking, and bending. When pain is severe, she feels weakness in her legs especially with walking. Some numbness noted in right leg that is chronic. No pain with sitting or laying flat.   Her cardiologist did not want her taking lodine  or any other NSAIDs.   She notes intermittent bladder urgency and incontinence (especially at night)  for over a year. No issues with her bowels. No perineal numbness.   Last HgbA1c on 06/10/23 was 5.5.   Tobacco use: Does not smoke.   Conservative measures:   Physical therapy: initial evaluation with [facility] on 09/24/23 and was discharged on 10/24/23.  Multimodal medical therapy including regular antiinflammatories:  Tylenol , Voltaren Gel,Gabapentin , Oxycodone  Injections:  12/25/2023: Right L1-2 and right L4-5 transforaminal ESI  09/17/23: Right L1-2 and right L4-5 transforaminal ESI  06/27/2023: Right L1-2 and right L4-5 transforaminal ESI (60% relief)  03/14/2023: Right L1-2 and right L4-5 transforaminal ESI 12/05/2022: Right L1-2 and right L4-5 transforaminal ESI (60% relief, [place] location)  06/29/2022: Right L1-2 and right L4-5 transforaminal ESI (over 70% relief) 02/20/2022: Right L1-2 and right L4-5 transforaminal ESI (over 70% relief)  11/14/2021: Right L1-2 and right L4-5 transforaminal ESI (initially 70% relief then sustained greater than 50% relief) 07/20/2021: Right L1-2 and right L4-5 transforaminal ESI (70% relief) 04/18/2021: Right L1-2 and right L5-S1 transforaminal ESI (70% relief) 01/17/2021: Right L1-2 transforaminal ESI (over 60% relief) 11/30/2020: Right L1-2 transforaminal ESI (25% relief) 08/01/2020: Right sacrococcygeal injection (good relief of tailbone pain) 08/10/2019: L2-3 XLIF (Dr. Clois) 04/23/2019: Right L2-3 transforaminal ESI (relief of upper back pain, no relief of L3 radicular symptoms; right L3-4 transforaminal approach was aborted due to tachycardia, lightheadedness, O2 sat of 89%) 03/24/2019: Right sacroiliac joint injection under ultrasound 01/20/2019: Right L2-3 and right L3-4 transforaminal ESI (moderate to good relief x6 weeks) 12/05/2018: Right L2-3 and right L3-4 transforaminal ESI (moderate relief)    Past Surgery:  Right knee replacement 02/26/23 Dr. Mardee DOS 08/10/19 -  L2-3 XLIF, PSF (revision) DOS 08/24/19 - I and D and revision of posterior DOS 09/14/19 - debridement by Dr. Delores  The symptoms are causing a significant impact on the patient's life.   Review of Systems:  A 10 point review  of systems is negative, except for the pertinent positives and negatives detailed in the HPI.  Past Medical History: Past Medical History:  Diagnosis Date   Anemia    Anxiety    Aortic atherosclerosis    Arthritis    CAD (coronary artery disease)    a.) LHC 06/11/2007: 30% D1, 30% pRCA - med mgmt; b.) LHC 07/25/2011: 50% mLAD, 50% D1, 50% pRCA - med mgmt; c.) LHC 10/01/2017: 50% p-mLAD, 80% D1 - med mgmt; d.) R/LHC 12/26/2021: 50% p-mLAD, 80% oD1-D1. mRA 15, mPA 29, mPWCP 25, AO sat 94%, PA sat 69%, CO 6.0, CI 3.1, LVEDP 20   Chronic back pain    Chronic heart failure with preserved ejection fraction (HFpEF) (HCC)    a.) TTE 07/26/2017: EF 60-65%, no RWMAs, mild LVH, G1DD; b.) TTE 07/17/2017: EF 60-65%, no RWMAs, G2DD, norm RVSF; c.) TTE 09/08/2020: EF 60-65%, no RWMAs, G2DD, norm RVSF; d.) TTE 12/26/2021: EF 55-60%, mild LVH, G2DD, mild MR, AoV sclerosis without stenosis   CKD (chronic kidney disease), stage III (HCC)    Complication of anesthesia 2018   a.) aspirated with supraglottoic airway removal following wrist surgery   Constipation    DDD (degenerative disc disease), lumbar    a.) s/p BILATERAL laminectomies with decompression and fusion L2-S1; total of 2 surgeries (2017 and 2021) with subsequent development of associated post-operative infections each time   Depression    Diverticulosis    Dysplastic nevus 09/12/2017   L sup buttock - mild    Dysplastic nevus 09/18/2018   R sup med buttocks - mild    E coli infection    Eczema    Elevated hemidiaphragm (RIGHT)    GERD (gastroesophageal reflux disease)    Headache    Hemorrhoids    History of 2019 novel coronavirus disease (COVID-19) 10/31/2022   History of bilateral cataract extraction    History of colon polyps    History of gout    History of hiatal hernia    History of loop recorder 10/17/2017   a.) LINQ ILR placed; b.) removed 03/04/2019   History of vertigo    Hyperlipidemia    Hypertension    Hypothyroidism     Insomnia    a.) on tamazepam qhs PRN   Left thyroid nodule    Long term current use of aspirin     Lymphedema    NSVT (nonsustained ventricular tachycardia) (HCC)    a.) in setting of acute respiratory failure secondary to AECOPD and CHF; aggressive diuresis + IV steroids led to NSVT + frequent PVCs --> Tx'd with metoprolol  (significant fatigue with higher doses)   OSA on CPAP    Paroxysmal atrial tachycardia    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   a.) s/p placement of Biotronik [place] 8 DR-T DC PPM on 03/04/2019   Restless leg syndrome    a.) on ropinirole    Rosacea    Statin intolerance    Symptomatic bradycardia    a.) s/p placement of Biotronik [place] 8 DR-T DC PPM on 03/04/2019   Syncope    a.) documented bradycardia and pauses --> s/p Biotronik [place] 8 DR-T DC PPM placement on 03/04/2019   T2DM (type 2 diabetes mellitus) (HCC)    Varicose  veins     Past Surgical History: Past Surgical History:  Procedure Laterality Date   BACK SURGERY     CARPAL TUNNEL RELEASE Bilateral    CATARACT EXTRACTION W/ INTRAOCULAR LENS IMPLANT     CHOLECYSTECTOMY     COLONOSCOPY     COLONOSCOPY WITH PROPOFOL  N/A 11/25/2018   Procedure: COLONOSCOPY WITH PROPOFOL ;  Surgeon: Toledo, Ladell POUR, MD;  Location: [facility];  Service: Gastroenterology;  Laterality: N/A;   DIAGNOSTIC LAPAROSCOPY     multiple times   DILATION AND CURETTAGE OF UTERUS     ESOPHAGOGASTRODUODENOSCOPY (EGD) WITH PROPOFOL  N/A 11/01/2014   Procedure: ESOPHAGOGASTRODUODENOSCOPY (EGD) WITH PROPOFOL ;  Surgeon: Deward CINDERELLA Piedmont, MD;  Location: [facility];  Service: Gastroenterology;  Laterality: N/A;   ESOPHAGOGASTRODUODENOSCOPY (EGD) WITH PROPOFOL  N/A 11/25/2018   Procedure: ESOPHAGOGASTRODUODENOSCOPY (EGD) WITH PROPOFOL ;  Surgeon: Toledo, Ladell POUR, MD;  Location: [facility];  Service: Gastroenterology;  Laterality: N/A;   ESOPHAGOGASTRODUODENOSCOPY (EGD) WITH PROPOFOL  N/A 07/11/2022   Procedure:  ESOPHAGOGASTRODUODENOSCOPY (EGD) WITH PROPOFOL ;  Surgeon: Toledo, Ladell POUR, MD;  Location: [facility];  Service: Gastroenterology;  Laterality: N/A;  IDDM   HARDWARE REMOVAL Left 10/11/2016   Procedure: HARDWARE REMOVAL-LEFT RADIUS;  Surgeon: Leora Lynwood SAUNDERS, MD;  Location: [facility];  Service: Orthopedics;  Laterality: Left;  Left Radius Wrist    KNEE ARTHROPLASTY Right 01/14/2023   Procedure: COMPUTER ASSISTED TOTAL KNEE ARTHROPLASTY;  Surgeon: Mardee Lynwood SQUIBB, MD;  Location: [facility];  Service: Orthopedics;  Laterality: Right;   KNEE ARTHROSCOPY Right 05/09/2020   Procedure: ARTHROSCOPY KNEE;  Surgeon: Mardee Lynwood SQUIBB, MD;  Location: [facility];  Service: Orthopedics;  Laterality: Right;   LAMINECTOMY  11/13/2015   LEFT HEART CATH AND CORONARY ANGIOGRAPHY  06/11/2007   Procedure: LEFT HEART CATH AND CORONARY ANGIOGRAPHY; Location: [facility]; Surgeon: Toribio Fuel, MD   LEFT HEART CATH AND CORONARY ANGIOGRAPHY Left 10/01/2017   Procedure: LEFT HEART CATH AND CORONARY ANGIOGRAPHY;  Surgeon: Mady Bruckner, MD;  Location: [facility];  Service: Cardiovascular;  Laterality: Left;   LEFT HEART CATH AND CORONARY ANGIOGRAPHY Left 07/25/2011   Procedure: LEFT HEART CATH AND CORONARY ANGIOGRAPHY; Location: [facility]; Surgeon: Timpthy Gollan, MD   LOOP RECORDER INSERTION N/A 10/17/2017   Procedure: LOOP RECORDER INSERTION;  Surgeon: Fernande Elspeth BROCKS, MD;  Location: [facility] INVASIVE CV LAB;  Service: Cardiovascular;  Laterality: N/A;   LOOP RECORDER REMOVAL N/A 03/04/2019   Procedure: LOOP RECORDER REMOVAL;  Surgeon: Fernande Elspeth BROCKS, MD;  [facility]: [facility] INVASIVE CV LAB;  Service: Cardiovascular;  Laterality: N/A;   LUMBAR FUSION  11/2015   LUMBAR WOUND DEBRIDEMENT N/A 12/02/2015   Procedure: WOUND Exploration;  Surgeon: Gerldine Maizes, MD;  Location: [facility];  Service: Neurosurgery;  Laterality: N/A;   OPEN REDUCTION INTERNAL FIXATION (ORIF) DISTAL RADIAL FRACTURE Left 08/29/2016   Procedure: OPEN  REDUCTION INTERNAL FIXATION (ORIF) DISTAL RADIAL FRACTURE;  Surgeon: Leora Lynwood SAUNDERS, MD;  Location: [facility];  Service: Orthopedics;  Laterality: Left;   PACEMAKER IMPLANT N/A 03/04/2019   Procedure: PACEMAKER IMPLANT;  Surgeon: Fernande Elspeth BROCKS, MD;  Location: [facility] INVASIVE CV LAB;  Service: Cardiovascular;  Laterality: N/A;   PICC LINE PLACE PERIPHERAL ([facility] HX)     right upper arm    RIGHT/LEFT HEART CATH AND CORONARY ANGIOGRAPHY N/A 12/26/2021   Procedure: RIGHT/LEFT HEART CATH AND CORONARY ANGIOGRAPHY;  Surgeon: Mady Bruckner, MD;  Location: [facility] INVASIVE CV LAB;  Service: Cardiovascular;  Laterality: N/A;   ROTATOR CUFF REPAIR Right    SAVORY DILATION  N/A 11/01/2014   Procedure: SAVORY DILATION;  Surgeon: Deward CINDERELLA Piedmont, MD;  Location: [facility] ENDOSCOPY;  Service: Gastroenterology;  Laterality: N/A;   TONSILLECTOMY     TOTAL ABDOMINAL HYSTERECTOMY W/ BILATERAL SALPINGOOPHORECTOMY     TRIGGER FINGER RELEASE Bilateral     Allergies: Allergies as of 01/23/2024 - Review Complete 12/27/2023  Allergen Reaction Noted   Ezetimibe  Diarrhea and Nausea Only 07/19/2017   Hydrocodone -acetaminophen  Other (See Comments) 09/25/2021   Metoclopramide  Diarrhea 07/30/2017   Propoxyphene Other (See Comments) 11/24/2018   Cefuroxime  09/25/2021   Hydrocodone  Other (See Comments) 07/26/2020   Tramadol  Hives 08/08/2018   Ceftin [cefuroxime axetil] Diarrhea 11/04/2015   Codeine Rash 05/05/2010   Penicillins Rash and Other (See Comments) 05/05/2010   Statins Other (See Comments) 06/05/2013   Vicodin [hydrocodone -acetaminophen ] Other (See Comments) 10/23/2013    Medications: Outpatient Encounter Medications as of 01/23/2024  Medication Sig   Acetaminophen  (TYLENOL  ARTHRITIS PAIN PO) Take 1,300 mg by mouth every 8 (eight) hours as needed (pain).   albuterol  (PROVENTIL ) (2.5 MG/3ML) 0.083% nebulizer solution Take 3 mLs (2.5 mg total) by nebulization every 4 (four) hours as needed for wheezing or shortness of  breath.   albuterol  (VENTOLIN  HFA) 108 (90 Base) MCG/ACT inhaler Inhale 2 puffs into the lungs every 6 (six) hours as needed.   aspirin  81 MG tablet Take 1 tablet (81 mg total) by mouth 2 (two) times daily.   atenolol  (TENORMIN ) 25 MG tablet Take 1 tablet (25 mg total) by mouth 2 (two) times daily.   budesonide -formoterol  (SYMBICORT ) 160-4.5 MCG/ACT inhaler Inhale 2 puffs into the lungs 2 (two) times daily.   cholecalciferol  (VITAMIN D3) 25 MCG (1000 UNIT) tablet Take 2,000 Units by mouth daily.   Crisaborole  (EUCRISA ) 2 % OINT Apply 1 Application topically as directed. qd to bid to aa eczema on back as needed for flares   desvenlafaxine (PRISTIQ) 50 MG 24 hr tablet Take 50 mg by mouth daily.   diclofenac Sodium (VOLTAREN) 1 % GEL Apply 2 g topically 2 (two) times daily as needed (pain).   doxycycline  (PERIOSTAT ) 20 MG tablet TAKE 1 TABLET BY MOUTH TWICE A DAY   Dupilumab  (DUPIXENT ) 300 MG/2ML SOAJ Inject 300 mg into the skin every 14 (fourteen) days. Starting at day 15 for maintenance.   empagliflozin  (JARDIANCE ) 10 MG TABS tablet Take 10 mg by mouth daily.   esomeprazole (NEXIUM) 40 MG capsule Take 40 mg by mouth 2 (two) times daily.   etodolac  (LODINE ) 400 MG tablet Take 400 mg by mouth 2 (two) times daily.   gabapentin  (NEURONTIN ) 300 MG capsule Take 300 mg by mouth 3 (three) times daily.   Insulin  Aspart (NOVOLOG  FLEXPEN [place]) Inject 10 Units into the skin daily as needed (when eating a high sugar meal).   LANTUS  SOLOSTAR 100 UNIT/ML Solostar Pen Inject 16 Units into the skin every morning.   levothyroxine  (SYNTHROID , LEVOTHROID) 75 MCG tablet Take 75 mcg by mouth daily before breakfast.    metolazone  (ZAROXOLYN ) 2.5 MG tablet Take 2.5 mg by mouth daily as needed (fluid overload). Weight over 215 lb   mometasone  (ELOCON ) 0.1 % cream Apply 1 application topically daily as needed (Rash). Qd to bid up to 5 days a week aa right lower leg prn itching   nitroGLYCERIN  (NITROSTAT ) 0.4 MG SL tablet  Place 1 tablet (0.4 mg total) under the tongue every 5 (five) minutes as needed for chest pain.   NON FORMULARY Apply 1 application  topically 2 (two) times daily  as needed. Azelaic acid /metronidazole /ivermectin  15%/1%/1% Apply to face 1-2 daily as needed for rosacea   ondansetron  (ZOFRAN ) 4 MG tablet Take 4 mg by mouth every 8 (eight) hours as needed for nausea or vomiting.    oxyCODONE  (ROXICODONE ) 5 MG immediate release tablet Take 1 tablet (5 mg total) by mouth every 8 (eight) hours as needed for severe pain (pain score 7-10).   OZEMPIC, 0.25 OR 0.5 MG/DOSE, 2 MG/3ML SOPN    potassium chloride  (KLOR-CON ) 10 MEQ tablet Take 3 tablets (30 mEq total) by mouth 2 (two) times daily.   PRALUENT  150 MG/ML SOAJ INJECT 150 MG UNDER THE SKIN EVERY 14 DAYS   ranolazine  (RANEXA ) 500 MG 12 hr tablet Take 2 tablets (1,000 mg total) by mouth 2 (two) times daily.   ropinirole  (REQUIP ) 5 MG tablet Take 5 mg by mouth every evening.   tacrolimus  (PROTOPIC ) 0.1 % ointment Apply topically daily. Apply to aa's eczema BID PRN.   temazepam  (RESTORIL ) 30 MG capsule Take 30 mg by mouth at bedtime.   torsemide  (DEMADEX ) 20 MG tablet Take 2 tablets (40 mg total) by mouth daily. May take an additional 1-2 tablets (20 MG-40 MG) as needed for weight gain or swelling.   trolamine salicylate (ASPERCREME) 10 % cream Apply 1 application topically at bedtime.   Facility-Administered Encounter Medications as of 01/23/2024  Medication   dexamethasone  (DECADRON ) injection 10 mg    Social History: Social History   Tobacco Use   Smoking status: Never   Smokeless tobacco: Never  Vaping Use   Vaping status: Never Used  Substance Use Topics   Alcohol use: No   Drug use: No    Family Medical History: Family History  Problem Relation Age of Onset   Heart disease Mother    Breast cancer Mother 61   Heart disease Father    Heart attack Sister    Heart disease Sister    Heart disease Brother     Physical  Examination: There were no vitals filed for this visit.  Awake, alert, oriented to person, place, and time.  Speech is clear and fluent. Fund of knowledge is appropriate.   Cranial Nerves: Pupils equal round and reactive to light.  Facial tone is symmetric.    No abnormal lesions on exposed skin.   Strength: Side Iliopsoas Quads Hamstring PF DF EHL  R 4 5 5 5 5 5   L 5 5 5 5 5 5     Reflexes are 2+ and symmetric at the patella and achilles.     Clonus is not present.    Bilateral  lower extremity sensation is intact to light touch.     She has no pain with IR/ER of both hips.   Gait is slow.    Medical Decision Making  Imaging: none  Assessment and Plan: Ms. Godby a history of revision PSF/XLIF L2-L3 with Dr. Clois on 08/10/19. She also had been seeing Dr. Claudene for right sciatic neuropathy s/p right TKA that resolved.    She's had chronic intermittent back and bilateral leg pain (entire leg) to her feet that is worse with standing/walking and better with sitting since her surgery.   She has about 80% improvement in her pain after injection for about a month. Pain is slowly returning. She has intermittent right sided LBP with radiation to her groin.  When pain is severe, she feels weakness in her legs especially with walking. Some numbness noted in right leg that is chronic.   She has  known vacuum disc T11-L1 along with vacuum disc L1-L2 with disc bulging and mild right lateral recess stenosis. She is fused from L2-S1.  Treatment options discussed with patient and following plan mdae with patient:  - She will restart aquatic exercise when pool is open again.  - She will keep scheduled appointment for repeat injection in February with PMR.  - She does not want to consider any further surgery at this time.  - I will message her in March to check on her. She will f/u prn at her request.   I spent a total of 20 minutes in face-to-face and non-face-to-face activities  related to this patient's care today including review of outside records, review of imaging, review of symptoms, physical exam, discussion of differential diagnosis, discussion of treatment options, and documentation.   Glade Boys PA-C Neurosurgery

## 2024-01-23 ENCOUNTER — Ambulatory Visit (INDEPENDENT_AMBULATORY_CARE_PROVIDER_SITE_OTHER): Admitting: Orthopedic Surgery

## 2024-01-23 ENCOUNTER — Encounter: Payer: Self-pay | Admitting: Orthopedic Surgery

## 2024-01-23 VITALS — BP 108/60 | Ht 62.0 in | Wt 174.2 lb

## 2024-01-23 DIAGNOSIS — M5126 Other intervertebral disc displacement, lumbar region: Secondary | ICD-10-CM

## 2024-01-23 DIAGNOSIS — M5416 Radiculopathy, lumbar region: Secondary | ICD-10-CM

## 2024-01-23 DIAGNOSIS — M48061 Spinal stenosis, lumbar region without neurogenic claudication: Secondary | ICD-10-CM

## 2024-01-23 DIAGNOSIS — M5124 Other intervertebral disc displacement, thoracic region: Secondary | ICD-10-CM | POA: Diagnosis not present

## 2024-01-23 DIAGNOSIS — M47816 Spondylosis without myelopathy or radiculopathy, lumbar region: Secondary | ICD-10-CM

## 2024-01-23 DIAGNOSIS — M4726 Other spondylosis with radiculopathy, lumbar region: Secondary | ICD-10-CM | POA: Diagnosis not present

## 2024-01-23 DIAGNOSIS — Z981 Arthrodesis status: Secondary | ICD-10-CM

## 2024-01-24 ENCOUNTER — Telehealth: Admitting: Orthopedic Surgery

## 2024-02-24 ENCOUNTER — Emergency Department
Admission: EM | Admit: 2024-02-24 | Discharge: 2024-02-24 | Disposition: A | Discharge status: Home / Self Care | Attending: Emergency Medicine | Admitting: Emergency Medicine

## 2024-02-24 ENCOUNTER — Other Ambulatory Visit: Payer: Self-pay

## 2024-02-24 ENCOUNTER — Emergency Department

## 2024-02-24 ENCOUNTER — Encounter: Payer: Self-pay | Admitting: *Deleted

## 2024-02-24 DIAGNOSIS — M25531 Pain in right wrist: Secondary | ICD-10-CM | POA: Insufficient documentation

## 2024-02-24 DIAGNOSIS — S0081XA Abrasion of other part of head, initial encounter: Secondary | ICD-10-CM | POA: Insufficient documentation

## 2024-02-24 DIAGNOSIS — Y9301 Activity, walking, marching and hiking: Secondary | ICD-10-CM | POA: Diagnosis not present

## 2024-02-24 DIAGNOSIS — S0990XA Unspecified injury of head, initial encounter: Secondary | ICD-10-CM

## 2024-02-24 DIAGNOSIS — W108XXA Fall (on) (from) other stairs and steps, initial encounter: Secondary | ICD-10-CM | POA: Diagnosis not present

## 2024-02-24 DIAGNOSIS — E041 Nontoxic single thyroid nodule: Secondary | ICD-10-CM | POA: Diagnosis not present

## 2024-02-24 DIAGNOSIS — M25551 Pain in right hip: Secondary | ICD-10-CM | POA: Diagnosis not present

## 2024-02-24 DIAGNOSIS — W19XXXA Unspecified fall, initial encounter: Secondary | ICD-10-CM

## 2024-02-24 DIAGNOSIS — S40811A Abrasion of right upper arm, initial encounter: Secondary | ICD-10-CM | POA: Insufficient documentation

## 2024-02-24 MED ORDER — ONDANSETRON 4 MG PO TBDP: 4.0000 mg | ORAL_TABLET | Freq: Three times a day (TID) | ORAL | 0 refills | Status: AC | PRN

## 2024-02-24 MED ORDER — ACETAMINOPHEN 500 MG PO TABS
1000.0000 mg | ORAL_TABLET | Freq: Once | ORAL | Status: AC
  Administered 2024-02-24: 1000 mg via ORAL
  Filled 2024-02-24: qty 2

## 2024-02-24 MED ORDER — ONDANSETRON 4 MG PO TBDP
4.0000 mg | ORAL_TABLET | Freq: Once | ORAL | Status: AC
  Administered 2024-02-24: 4 mg via ORAL
  Filled 2024-02-24: qty 1

## 2024-02-24 NOTE — Discharge Instructions (Addendum)
 You were seen in the emergency department following a fall with head injury.  You had a CT scan of your head that did not show any signs of internal bleeding.  She did not have any findings of a fracture.  Concern that you may have a mild concussion.  You are given a prescription for nausea medication.  You can take Tylenol  for pain control.  Follow-up closely with your primary care physician.  Return to the emergency department for any ongoing or worsening symptoms.  Pain control:  Acetaminophen  (tylenol ) - You can take 2 extra strength tablets (1000 mg) every 6 hours as needed for pain/fever.   You had an incidental finding of a thyroid nodule on the CT scan of your neck.  Follow this up with your primary care physician to discuss if you need any further workup.

## 2024-02-24 NOTE — ED Provider Notes (Addendum)
 "  [facility] Provider Note    Event Date/Time   First MD Initiated Contact with Patient 02/24/24 1951     (approximate)   History   Fall   HPI  [name] is a 79 y.o. female past medical history significant for gait instability, who presents to the emergency department following a fall.  States that she was walking down 1 step using her cane and holding her phone and fell.  Denies any chest pain or shortness of breath.  Denies any loss of consciousness.  States that she hit her head and has been having a headache with mild nausea since that time.  Complaining of some mild pain to her right forearm and wrist.  Mild pain to her right hip.  Ambulatory since the fall.  Not on anticoagulation.  Denies any change in vision, slurring of speech or trouble swallowing.  No extremity numbness or weakness.  No chest pain or shortness of breath.  Not on anticoagulation.  Tetanus is up-to-date.     Physical Exam   Triage Vital Signs: ED Triage Vitals [02/24/24 1734]  Encounter Vitals Group     BP (!) 146/74     Girls Systolic BP Percentile      Girls Diastolic BP Percentile      Boys Systolic BP Percentile      Boys Diastolic BP Percentile      Pulse Rate 72     Resp 18     Temp 97.8 F (36.6 C)     Temp Source Oral     SpO2 98 %     Weight 175 lb (79.4 kg)     Height 5' 1 (1.549 m)     Head Circumference      Peak Flow      Pain Score 5     Pain Loc      Pain Education      Exclude from Growth Chart     Most recent vital signs: Vitals:   02/24/24 1734  BP: (!) 146/74  Pulse: 72  Resp: 18  Temp: 97.8 F (36.6 C)  SpO2: 98%    Physical Exam HENT:     Head:     Comments: Abrasion to the right forehead.    Right Ear: External ear normal.     Left Ear: External ear normal.     Mouth/Throat:     Mouth: Mucous membranes are moist.  Eyes:     Extraocular Movements: Extraocular movements intact.     Conjunctiva/sclera: Conjunctivae normal.      Pupils: Pupils are equal, round, and reactive to light.  Cardiovascular:     Rate and Rhythm: Normal rate.  Pulmonary:     Effort: No respiratory distress.  Abdominal:     General: There is no distension.     Tenderness: There is no abdominal tenderness.  Musculoskeletal:        General: Tenderness (Mild tenderness to palpation to the right wrist with no deformity or swelling.) present. Normal range of motion.     Cervical back: No tenderness.     Comments: No midline thoracic or lumbar tenderness to palpation.  No tenderness to palpation to the chest wall.  No tenderness to palpation to bilateral lower extremities.  Mild tenderness to palpation to the right hip.  Able to range the right hip with no significant pain.  Skin:    Capillary Refill: Capillary refill takes less than 2 seconds.  Neurological:  General: No focal deficit present.     Mental Status: She is alert.  Psychiatric:        Mood and Affect: Mood normal.      IMPRESSION / MDM / ASSESSMENT AND PLAN / ED COURSE  I reviewed the triage vital signs and the nursing notes.  Differential diagnosis including concussion, intracranial hemorrhage, cervical spine injury, right wrist fracture, right hip fracture, musculoskeletal strain.  No findings of a basilar skull fracture.   RADIOLOGY I independently reviewed imaging, my interpretation of imaging: CT scan of the head with no signs of intracranial hemorrhage. -No acute intracranial infarction or trauma.  CT scan of the cervical spine with no acute fracture. Incidental finding of thyroid nodule.  Discussed with patient.  States that she will follow-up with her primary care physician this is already known to her.  X-ray of the right wrist and right hip with no acute fracture   Labs (all labs ordered are listed, but only abnormal results are displayed) Labs interpreted as -    Labs Reviewed - No data to display  Patient was given Tylenol  and antiemetics with  Zofran   Tetanus is up-to-date.  Have a low suspicion for ligamentous injury.  No chest wall tenderness to palpation, have a low suspicion for rib fracture, rib contusion or lung contusion.  No midline thoracic or lumbar tenderness to palpation have low suspicion for fracture.  Tetanus is up-to-date.  Most likely with a small concussion.  Discussed symptomatic treatment and concussion management.  Discussed close follow-up with primary care physician.  Given a prescription for antiemetics.  No questions or concerns at time of discharge.     PROCEDURES:  Critical Care performed: No  Procedures  Patient's presentation is most consistent with acute presentation with potential threat to life or bodily function.   MEDICATIONS ORDERED IN ED: Medications  ondansetron  (ZOFRAN -ODT) disintegrating tablet 4 mg (4 mg Oral Given 02/24/24 2040)  acetaminophen  (TYLENOL ) tablet 1,000 mg (1,000 mg Oral Given 02/24/24 2040)    FINAL CLINICAL IMPRESSION(S) / ED DIAGNOSES   Final diagnoses:  Fall, initial encounter  Injury of head, initial encounter  Arm abrasion, right, initial encounter  Thyroid nodule     Rx / DC Orders   ED Discharge Orders          Ordered    ondansetron  (ZOFRAN -ODT) 4 MG disintegrating tablet  Every 8 hours PRN        02/24/24 2013             Note:  This document was prepared using Dragon voice recognition software and may include unintentional dictation errors.   Suzanne Kirsch, MD 02/24/24 2129    Suzanne Kirsch, MD 02/24/24 2144  "

## 2024-02-24 NOTE — ED Triage Notes (Addendum)
 Pt to triage via wheelchair.  Pt fell down 1 step outside today.  Pt has right wrist pain, headache and right hip pain.  Abrasions to right eyebrow area.  Abrasions to right forearm.  No loc. No vomiting.  No neck pain.  No back pain. No blood thinners  Pt alert  speech clear.

## 2024-02-25 ENCOUNTER — Ambulatory Visit: Admitting: Dermatology

## 2024-02-25 ENCOUNTER — Encounter: Payer: Self-pay | Admitting: Dermatology

## 2024-02-25 DIAGNOSIS — Z7189 Other specified counseling: Secondary | ICD-10-CM

## 2024-02-25 DIAGNOSIS — L2089 Other atopic dermatitis: Secondary | ICD-10-CM | POA: Diagnosis not present

## 2024-02-25 DIAGNOSIS — B370 Candidal stomatitis: Secondary | ICD-10-CM | POA: Diagnosis not present

## 2024-02-25 DIAGNOSIS — L719 Rosacea, unspecified: Secondary | ICD-10-CM | POA: Diagnosis not present

## 2024-02-25 DIAGNOSIS — Z79899 Other long term (current) drug therapy: Secondary | ICD-10-CM | POA: Diagnosis not present

## 2024-02-25 MED ORDER — FLUCONAZOLE 200 MG PO TABS: ORAL_TABLET | ORAL | 0 refills | Status: AC

## 2024-02-25 MED ORDER — LEBRIKIZUMAB-LBKZ 250 MG/2ML ~~LOC~~ SOAJ
500.0000 mg | Freq: Once | SUBCUTANEOUS | Status: AC
  Administered 2024-02-25: 500 mg via SUBCUTANEOUS

## 2024-02-25 NOTE — Patient Instructions (Addendum)

## 2024-02-25 NOTE — Progress Notes (Signed)
 "  Follow-Up Visit   Subjective  [name] is a 79 y.o. female who presents for the following: here for atopic dermatitis follow up, reports flared at upper back, Was on Dupixent   Patient reports had a reaction after self injecting dupixent  at home = did not feel well for about 24 hours, then was fine. Patient stopped dupixent  for 2 months and flared at upper back. Currently not using anything.   The following portions of the chart were reviewed this encounter and updated as appropriate: medications, allergies, medical history  Review of Systems:  No other skin or systemic complaints except as noted in HPI or Assessment and Plan.  Objective  Well appearing patient in no apparent distress; mood and affect are within normal limits.   A focused examination was performed of the following areas: Back   Relevant exam findings are noted in the Assessment and Plan.    Assessment & Plan   Candidiasis at mouth, buccal mucosa (less likely lichen planus) no symptoms just feels thickening with her tongue Reports no soreness  Exam: b/l buccal mucosa with 3.5 cm white patches  Plan: Hold Doxycycline  40 mg  Start Difflucan 200 mg tab - take 1 po 3 days a week for 2 weeks. 6 tabs 0 rfl Will recheck at follow up   ATOPIC DERMATITIS back Exam: excoriations back with scale - see photos improved itch and rash  Hx of years  5% BSA Chronic and persistent condition with duration or expected duration over one year. Condition is symptomatic/ bothersome to patient. Not currently at goal.  Atopic dermatitis (eczema) is a chronic, relapsing, pruritic condition that can significantly affect quality of life. It is often associated with allergic rhinitis and/or asthma and can require treatment with topical medications, phototherapy, or in severe cases biologic injectable medication (Dupixent ; Adbry) or Oral JAK inhibitors. Treatment Plan: Discussed Ebglyss  250 mg / 2 ml injection  Will start  samples today 2 injections given for a total of 500 mg into right and left upper umbilicus Patient tolerated well with no adverse reactions Lot: [id number] C Exp 07/18/2025  NDC: 9997-2227-88  Will continue and recheck in 2 weeks plan to give another injection at follow up   Hold Eucrisa  ointment qd/bid to aa eczema  Potential side effects include allergic reaction, herpes infections, injection site reactions and conjunctivitis (inflammation of the eyes).  The use of Dupixent  requires long term medication management, including periodic office visits.      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 Condition is improving with treatment but not currently at goal.  Rosacea is a chronic progressive skin condition usually affecting the face of adults, causing redness and/or acne bumps. It is treatable but not curable. It sometimes affects the eyes (ocular rosacea) as well. It may respond to topical and/or systemic medication and can flare with stress, sun exposure, alcohol, exercise, topical steroids (including hydrocortisone/cortisone 10) and some foods.  Daily application of broad spectrum spf 30+ sunscreen to face is recommended to reduce flares. Treatment Plan Stop  Doxycyline 40 mg total  if restart plan to decrease to 20 mg po qd  Continue SM triple Cream qd/bid     Doxycycline  should be taken with food to prevent nausea. Do not lay down for 30 minutes after taking. Be cautious with sun exposure and use good sun protection while on this medication. Pregnant women should not take this medication.  Long term medication management.  Patient is using long term (months to years) prescription medication  to control their dermatologic condition.  These medications require periodic monitoring to evaluate for efficacy and side effects and may require periodic laboratory monitoring.  CANDIDIASIS OF  MOUTH   This Visit - fluconazole  (DIFLUCAN ) 200 MG tablet - Take 1 tab po 3 days a week for 2 weeks OTHER ATOPIC DERMATITIS   This Visit - lebrikizumab-lbkz  (EBGLYSS ) injection SOAJ 500 mg Existing Treatments - Dupilumab  (DUPIXENT ) 300 MG/2ML SOAJ - Inject 300 mg into the skin every 14 (fourteen) days. Starting at day 15 for maintenance.  Return for 2 week follow up on atopic dermatitis and Ebyglss injection also candida follow up.  IEleanor Blush, CMA, am acting as scribe for Alm Rhyme, MD.    Documentation: I have reviewed the above documentation for accuracy and completeness, and I agree with the above.  Alm Rhyme, MD    "

## 2024-02-26 ENCOUNTER — Ambulatory Visit: Payer: Medicare Other

## 2024-02-26 DIAGNOSIS — R55 Syncope and collapse: Secondary | ICD-10-CM | POA: Diagnosis not present

## 2024-02-27 LAB — CUP PACEART REMOTE DEVICE CHECK
Date Time Interrogation Session: 20260114072716
Implantable Lead Connection Status: 753985
Implantable Lead Connection Status: 753985
Implantable Lead Implant Date: 20210120
Implantable Lead Implant Date: 20210120
Implantable Lead Location: 753859
Implantable Lead Location: 753860
Implantable Lead Model: 5076
Implantable Lead Model: 5076
Implantable Pulse Generator Implant Date: 20210120
Pulse Gen Model: 407145
Pulse Gen Serial Number: 69765687

## 2024-02-28 ENCOUNTER — Other Ambulatory Visit: Payer: Self-pay | Admitting: Internal Medicine

## 2024-02-28 DIAGNOSIS — Z1231 Encounter for screening mammogram for malignant neoplasm of breast: Secondary | ICD-10-CM

## 2024-03-01 ENCOUNTER — Ambulatory Visit: Payer: Self-pay | Admitting: Cardiology

## 2024-03-04 NOTE — Progress Notes (Signed)
 Remote PPM Transmission

## 2024-03-11 ENCOUNTER — Ambulatory Visit: Admitting: Dermatology

## 2024-03-11 ENCOUNTER — Encounter: Payer: Self-pay | Admitting: Dermatology

## 2024-03-11 DIAGNOSIS — L2089 Other atopic dermatitis: Secondary | ICD-10-CM

## 2024-03-11 DIAGNOSIS — L719 Rosacea, unspecified: Secondary | ICD-10-CM

## 2024-03-11 DIAGNOSIS — B370 Candidal stomatitis: Secondary | ICD-10-CM

## 2024-03-11 DIAGNOSIS — Z7189 Other specified counseling: Secondary | ICD-10-CM

## 2024-03-11 DIAGNOSIS — Z79899 Other long term (current) drug therapy: Secondary | ICD-10-CM

## 2024-03-11 MED ORDER — LEBRIKIZUMAB-LBKZ 250 MG/2ML ~~LOC~~ SOAJ
500.0000 mg | Freq: Once | SUBCUTANEOUS | Status: AC
  Administered 2024-03-11: 500 mg via SUBCUTANEOUS

## 2024-03-11 MED ORDER — EBGLYSS 250 MG/2ML ~~LOC~~ SOSY: 250.0000 mg | PREFILLED_SYRINGE | SUBCUTANEOUS | 4 refills | Status: AC

## 2024-03-11 NOTE — Patient Instructions (Signed)

## 2024-03-11 NOTE — Progress Notes (Signed)
 "  Follow-Up Visit   Subjective  [name] is a 79 y.o. female who presents for the following:  2 week follow up on atopic dermatitis at back, patient states she is much improved after Ebglyss  injection. Less itchy and less flared Also follow up on Candidiasis of mouth , patient reports after taking Diflucan  and stopping doxycycline  white patches have cleared.  The following portions of the chart were reviewed this encounter and updated as appropriate: medications, allergies, medical history  Review of Systems:  No other skin or systemic complaints except as noted in HPI or Assessment and Plan.  Objective  Well appearing patient in no apparent distress; mood and affect are within normal limits.  A focused examination was performed of the following areas: Back, mouth , face   Relevant exam findings are noted in the Assessment and Plan.    Assessment & Plan    ATOPIC DERMATITIS back Exam: excoriations with spotty hyperpigmentation at back and arms  Less itch and less scale  Improved when compared to photos from 08/05/2023  5 % BSA  Chronic and persistent condition with duration or expected duration over one year. Condition is improving with treatment but not currently at goal on Ebglyss   Atopic dermatitis (eczema) is a chronic, relapsing, pruritic condition that can significantly affect quality of life. It is often associated with allergic rhinitis and/or asthma and can require treatment with topical medications, phototherapy, or in severe cases biologic injectable medication (Dupixent ; Adbry) or Oral JAK inhibitors. Treatment Plan:  Will continue Ebglyss  250 mg / 2 ml j  Will send rx to [facility]  Pending approval   Patient would like to self inject at home   Injected with Ebglyss  250 mg / 2 ml injection  2nd injection given today ( week 2 ) 2 injections given for a total of 500 mg into right and left upper umbilicus Patient tolerated well with no adverse reactions Lot:  [id number] C Exp 07/18/2025  NDC: 9997-2227-88   Will continue and recheck in 2 weeks plan to give another injection at follow up    Continue  Eucrisa  ointment qd/bid to aa eczema   Potential side effects include allergic reaction, herpes infections, injection site reactions and conjunctivitis (inflammation of the eyes).  The use of Dupixent  requires long term medication management, including periodic office visits.    Candidiasis at mouth, buccal mucosa   Resolved after oral Fluconazole . (2ndary to low dose oral Doxycycline  for Rosacea?) Exam: Clear at exam  Plan: No recommended treatment  Watch for recurrence.   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 Condition is improving with treatment but not currently at goal.  Rosacea is a chronic progressive skin condition usually affecting the face of adults, causing redness and/or acne bumps. It is treatable but not curable. It sometimes affects the eyes (ocular rosacea) as well. It may respond to topical and/or systemic medication and can flare with stress, sun exposure, alcohol, exercise, topical steroids (including hydrocortisone/cortisone 10) and some foods.  Daily application of broad spectrum spf 30+ sunscreen to face is recommended to reduce flares. Treatment Plan Discussed can restart doxycycline  20 mg po qd,  if recurrence of oral Candidiasis flares, discontinue and contact office  Will consider topical sulfur cream/wash preparation if patient develops yeast again after restarting oral doxycycline .  Continue SM triple Cream qd/bid      Doxycycline  should be taken with food to prevent nausea.  Do not lay down for 30 minutes after taking. Be cautious with sun exposure and use good sun protection while on this medication. Pregnant women should not take this medication.    Long term medication management.  Patient is using long term (months to  years) prescription medication  to control their dermatologic condition.  These medications require periodic monitoring to evaluate for efficacy and side effects and may require periodic laboratory monitoring. OTHER ATOPIC DERMATITIS   This Visit - lebrikizumab-lbkz  (EBGLYSS ) injection SOAJ 500 mg - Lebrikizumab-lbkz  (EBGLYSS ) 250 MG/2ML SOSY - Inject 250 mg into the skin every 14 (fourteen) days. Existing Treatments - Dupilumab  (DUPIXENT ) 300 MG/2ML SOAJ - Inject 300 mg into the skin every 14 (fourteen) days. Starting at day 15 for maintenance.  Return in about 3 months (around 06/09/2024) for atopic dermatitis / rosacea follow up.  IEleanor Blush, CMA, am acting as scribe for Alm Rhyme, MD.   Documentation: I have reviewed the above documentation for accuracy and completeness, and I agree with the above.  Alm Rhyme, MD    "

## 2024-03-25 ENCOUNTER — Ambulatory Visit

## 2024-03-30 ENCOUNTER — Ambulatory Visit: Admitting: Cardiovascular Disease

## 2024-03-31 ENCOUNTER — Ambulatory Visit: Admit: 2024-03-31 | Admitting: Internal Medicine

## 2024-04-14 ENCOUNTER — Encounter

## 2024-05-18 ENCOUNTER — Encounter

## 2024-05-27 ENCOUNTER — Ambulatory Visit: Payer: Medicare Other

## 2024-06-10 ENCOUNTER — Ambulatory Visit: Admitting: Dermatology

## 2024-08-26 ENCOUNTER — Ambulatory Visit: Payer: Medicare Other

## 2024-11-25 ENCOUNTER — Ambulatory Visit: Payer: Medicare Other
